# Patient Record
Sex: Male | Born: 1960
Health system: Southern US, Community
[De-identification: ages and names within clinical notes are randomized; demographics above are authoritative.]

## PROBLEM LIST (undated history)

## (undated) DIAGNOSIS — I639 Cerebral infarction, unspecified: Secondary | ICD-10-CM

## (undated) DIAGNOSIS — H9190 Unspecified hearing loss, unspecified ear: Secondary | ICD-10-CM

## (undated) DIAGNOSIS — D649 Anemia, unspecified: Secondary | ICD-10-CM

## (undated) DIAGNOSIS — C801 Malignant (primary) neoplasm, unspecified: Secondary | ICD-10-CM

## (undated) DIAGNOSIS — I2510 Atherosclerotic heart disease of native coronary artery without angina pectoris: Secondary | ICD-10-CM

## (undated) DIAGNOSIS — E785 Hyperlipidemia, unspecified: Secondary | ICD-10-CM

## (undated) DIAGNOSIS — G8929 Other chronic pain: Secondary | ICD-10-CM

## (undated) DIAGNOSIS — I219 Acute myocardial infarction, unspecified: Secondary | ICD-10-CM

## (undated) DIAGNOSIS — I10 Essential (primary) hypertension: Secondary | ICD-10-CM

## (undated) DIAGNOSIS — G43909 Migraine, unspecified, not intractable, without status migrainosus: Secondary | ICD-10-CM

## (undated) DIAGNOSIS — I739 Peripheral vascular disease, unspecified: Secondary | ICD-10-CM

## (undated) DIAGNOSIS — Z9289 Personal history of other medical treatment: Secondary | ICD-10-CM

## (undated) DIAGNOSIS — I422 Other hypertrophic cardiomyopathy: Secondary | ICD-10-CM

## (undated) DIAGNOSIS — M549 Dorsalgia, unspecified: Secondary | ICD-10-CM

## (undated) HISTORY — PX: INGUINAL HERNIA REPAIR: SUR1180

## (undated) HISTORY — DX: Cerebral infarction, unspecified: I63.9

## (undated) HISTORY — DX: Atherosclerotic heart disease of native coronary artery without angina pectoris: I25.10

## (undated) HISTORY — DX: Anemia, unspecified: D64.9

## (undated) HISTORY — DX: Hyperlipidemia, unspecified: E78.5

## (undated) HISTORY — DX: Acute myocardial infarction, unspecified: I21.9

## (undated) HISTORY — DX: Personal history of other medical treatment: Z92.89

## (undated) HISTORY — DX: Essential (primary) hypertension: I10

---

## 2006-02-20 ENCOUNTER — Inpatient Hospital Stay (HOSPITAL_COMMUNITY): Admission: EM | Admit: 2006-02-20 | Discharge: 2006-02-24 | Payer: Self-pay | Admitting: Emergency Medicine

## 2006-02-21 ENCOUNTER — Encounter (INDEPENDENT_AMBULATORY_CARE_PROVIDER_SITE_OTHER): Payer: Self-pay | Admitting: Interventional Cardiology

## 2006-02-21 ENCOUNTER — Encounter (INDEPENDENT_AMBULATORY_CARE_PROVIDER_SITE_OTHER): Payer: Self-pay | Admitting: Neurology

## 2006-02-28 ENCOUNTER — Encounter: Admission: RE | Admit: 2006-02-28 | Discharge: 2006-03-09 | Payer: Self-pay | Admitting: Neurology

## 2006-11-29 ENCOUNTER — Emergency Department (HOSPITAL_COMMUNITY): Admission: EM | Admit: 2006-11-29 | Discharge: 2006-11-30 | Payer: Self-pay | Admitting: Emergency Medicine

## 2009-09-17 ENCOUNTER — Inpatient Hospital Stay (HOSPITAL_COMMUNITY): Admission: EM | Admit: 2009-09-17 | Discharge: 2009-09-22 | Payer: Self-pay | Admitting: Emergency Medicine

## 2009-10-15 ENCOUNTER — Ambulatory Visit: Payer: Self-pay | Admitting: Internal Medicine

## 2009-10-15 DIAGNOSIS — Z8679 Personal history of other diseases of the circulatory system: Secondary | ICD-10-CM | POA: Insufficient documentation

## 2009-10-15 DIAGNOSIS — R51 Headache: Secondary | ICD-10-CM | POA: Insufficient documentation

## 2009-10-15 DIAGNOSIS — E785 Hyperlipidemia, unspecified: Secondary | ICD-10-CM | POA: Insufficient documentation

## 2009-10-15 DIAGNOSIS — R519 Headache, unspecified: Secondary | ICD-10-CM | POA: Insufficient documentation

## 2009-10-15 DIAGNOSIS — F17200 Nicotine dependence, unspecified, uncomplicated: Secondary | ICD-10-CM

## 2009-10-15 DIAGNOSIS — I252 Old myocardial infarction: Secondary | ICD-10-CM | POA: Insufficient documentation

## 2009-10-15 DIAGNOSIS — I10 Essential (primary) hypertension: Secondary | ICD-10-CM | POA: Insufficient documentation

## 2009-10-15 LAB — CONVERTED CEMR LAB
Cholesterol, target level: 200 mg/dL
HDL goal, serum: 40 mg/dL

## 2009-10-16 ENCOUNTER — Telehealth: Payer: Self-pay | Admitting: Internal Medicine

## 2009-11-05 ENCOUNTER — Telehealth: Payer: Self-pay | Admitting: Internal Medicine

## 2009-12-15 ENCOUNTER — Ambulatory Visit: Payer: Self-pay | Admitting: Internal Medicine

## 2009-12-15 DIAGNOSIS — K645 Perianal venous thrombosis: Secondary | ICD-10-CM

## 2009-12-15 LAB — CONVERTED CEMR LAB
ALT: 18 units/L (ref 0–53)
AST: 27 units/L (ref 0–37)
Albumin: 3.9 g/dL (ref 3.5–5.2)
Basophils Absolute: 0 10*3/uL (ref 0.0–0.1)
Basophils Relative: 0.7 % (ref 0.0–3.0)
Eosinophils Relative: 3.8 % (ref 0.0–5.0)
GFR calc non Af Amer: 77.07 mL/min (ref >60)
Glucose, Bld: 72 mg/dL (ref 70–99)
HCT: 30.8 % — ABNORMAL LOW (ref 39.0–52.0)
Hemoglobin: 10.4 g/dL — ABNORMAL LOW (ref 13.0–17.0)
Lymphs Abs: 1.7 10*3/uL (ref 0.7–4.0)
Monocytes Relative: 7.9 % (ref 3.0–12.0)
Neutro Abs: 2.2 10*3/uL (ref 1.4–7.7)
Potassium: 4 meq/L (ref 3.5–5.1)
RBC: 3.38 M/uL — ABNORMAL LOW (ref 4.22–5.81)
RDW: 13.6 % (ref 11.5–14.6)
Sodium: 142 meq/L (ref 135–145)
TSH: 1.83 microintl units/mL (ref 0.35–5.50)
Total Protein: 6.3 g/dL (ref 6.0–8.3)
VLDL: 9.6 mg/dL (ref 0.0–40.0)

## 2010-01-07 ENCOUNTER — Encounter: Payer: Self-pay | Admitting: Internal Medicine

## 2010-04-05 ENCOUNTER — Emergency Department (HOSPITAL_COMMUNITY): Admission: EM | Admit: 2010-04-05 | Discharge: 2010-04-05 | Payer: Self-pay | Admitting: Emergency Medicine

## 2010-07-02 NOTE — Assessment & Plan Note (Signed)
Summary: new [facility] pt--pkg--#---stc   Vital Signs:  Patient profile:   50 year old male Height:      68 inches Weight:      194.25 pounds BMI:     29.64 O2 Sat:      97 % on Room air Temp:     99.0 degrees F oral Pulse rate:   81 / minute Pulse rhythm:   regular Resp:     16 per minute BP sitting:   110 / 76  (left arm) Cuff size:   large  Vitals Entered By: Lucious Groves (Oct 15, 2009 10:31 AM)  Nutrition Counseling: Patient's BMI is greater than 25 and therefore counseled on weight management options.  O2 Flow:  Room air CC: NP--Est. Care./kb, Hypertension Management, Lipid Management Is Patient Diabetic? No Pain Assessment Patient in pain? no        Primary Care Provider:  Lennox Leikam  CC:  NP--Est. Care./kb, Hypertension Management, and Lipid Management.  History of Present Illness: New to me he states that he had a small MI one month ago at [facility]/Dr Katrinka Blazing says his coronary arteries were too small for stenting, he has fatigue but otherwise is doing well.  Hypertension History:      He denies headache, chest pain, palpitations, dyspnea with exertion, orthopnea, PND, peripheral edema, visual symptoms, neurologic problems, syncope, and side effects from treatment.        Positive major cardiovascular risk factors include male age 62 years old or older, hyperlipidemia, hypertension, and current tobacco user.  Negative major cardiovascular risk factors include no history of diabetes and negative family history for ischemic heart disease.        Positive history for target organ damage include ASHD (either angina/prior MI/prior CABG) and prior stroke (or TIA).  Further assessment for target organ damage reveals no history of cardiac end-organ damage (CHF/LVH), peripheral vascular disease, renal insufficiency, or hypertensive retinopathy.    Lipid Management History:      Positive NCEP/ATP III risk factors include male age 64 years old or older, current tobacco user, hypertension, ASHD  (either angina/prior MI/prior CABG), and prior stroke (or TIA).  Negative NCEP/ATP III risk factors include non-diabetic, no family history for ischemic heart disease, no peripheral vascular disease, and no history of aortic aneurysm.        The patient states that he knows about the "Therapeutic Lifestyle Change" diet.  His compliance with the TLC diet is good.  The patient expresses understanding of adjunctive measures for cholesterol lowering.  Adjunctive measures started by the patient include aerobic exercise, fiber, ASA, limit alcohol consumpton, and weight reduction.  He expresses no side effects from his lipid-lowering medication.  The patient denies any symptoms to suggest myopathy or liver disease.      Preventive Screening-Counseling & Management  Alcohol-Tobacco     Alcohol drinks/day: 0     Smoking Status: current     Smoking Cessation Counseling: yes     Smoke Cessation Stage: precontemplative     Packs/Day: 1.0     Year Started: 1985     Pack years: 59     Tobacco Counseling: to quit use of tobacco products  Hep-HIV-STD-Contraception     Hepatitis Risk: no risk noted     HIV Risk: no risk noted     STD Risk: no risk noted      Sexual History:  currently monogamous.        Drug Use:  never and no.  Blood Transfusions:  no.    Medications Prior to Update: 1)  None  Current Medications (verified): 1)  Amlodipine Besylate 10 Mg Tabs (Amlodipine Besylate) .Marland Kitchen.. 1 By Mouth Qd 2)  Aspirin 325 Mg Tabs (Aspirin) .Marland Kitchen.. 1 By Mouth Qd 3)  Flexeril 10 Mg Tabs (Cyclobenzaprine Hcl) .Marland Kitchen.. 1 By Mouth Three Times A Day Prn 4)  Hydrochlorothiazide 12.5 Mg Caps (Hydrochlorothiazide) .Marland Kitchen.. 1 By Mouth Qd 5)  Hydrocodone-Acetaminophen 5-325 Mg Tabs (Hydrocodone-Acetaminophen) .Marland Kitchen.. 1-2 By Mouth Q 6h Prn 6)  Labetalol Hcl 200 Mg Tabs (Labetalol Hcl) .Marland Kitchen.. 1 By Mouth Bid 7)  Benicar 40 Mg Tabs (Olmesartan Medoxomil) .Marland Kitchen.. 1 By Mouth Qd 8)  Pantoprazole Sodium 40 Mg Tbec (Pantoprazole  Sodium) .Marland Kitchen.. 1 By Mouth Qd 9)  Zocor 40 Mg Tabs (Simvastatin) .Marland Kitchen.. 1 By Mouth Qd  Allergies (verified): 1)  ! Lisinopril  Past History:  Past Medical History: Headache Hyperlipidemia Hypertension Myocardial infarction, hx of Cerebrovascular accident, hx of  Past Surgical History: Hernia 1988  Family History: Reviewed history and no changes required. Family History High cholesterol Family History Hypertension  Social History: Reviewed history and no changes required. Occupation:  Nutritional therapist Current Smoker Alcohol use-no Drug use-no Smoking Status:  current Drug Use:  never, no Packs/Day:  1.0 Hepatitis Risk:  no risk noted HIV Risk:  no risk noted STD Risk:  no risk noted Sexual History:  currently monogamous Blood Transfusions:  no  Review of Systems  The patient denies anorexia, fever, weight loss, weight gain, chest pain, peripheral edema, prolonged cough, abdominal pain, hematuria, difficulty walking, and depression.    Physical Exam  General:  alert, well-developed, well-nourished, well-hydrated, appropriate dress, normal appearance, healthy-appearing, cooperative to examination, and good hygiene.   Head:  normocephalic, atraumatic, no abnormalities observed, and no abnormalities palpated.   Eyes:  vision grossly intact, pupils equal, pupils round, and pupils reactive to light.   Mouth:  Oral mucosa and oropharynx without lesions or exudates.  Teeth in good repair. Neck:  supple, full ROM, no masses, no thyromegaly, no thyroid nodules or tenderness, no JVD, normal carotid upstroke, no carotid bruits, no cervical lymphadenopathy, and no neck tenderness.   Lungs:  normal respiratory effort, no intercostal retractions, no accessory muscle use, normal breath sounds, no dullness, no fremitus, no crackles, and no wheezes.   Heart:  normal rate, regular rhythm, no murmur, no gallop, no rub, and no JVD.   Abdomen:  soft, non-tender, normal bowel sounds, no distention, no  masses, no guarding, no rigidity, no rebound tenderness, no abdominal hernia, no inguinal hernia, no hepatomegaly, and no splenomegaly.   Msk:  normal ROM, no joint tenderness, no joint swelling, no joint warmth, no redness over joints, no joint deformities, no joint instability, no crepitation, and no muscle atrophy.   Pulses:  R and L carotid,radial,femoral,dorsalis pedis and posterior tibial pulses are full and equal bilaterally Extremities:  No clubbing, cyanosis, edema, or deformity noted with normal full range of motion of all joints.   Neurologic:  No cranial nerve deficits noted. Station and gait are normal. Plantar reflexes are down-going bilaterally. DTRs are symmetrical throughout. Sensory, motor and coordinative functions appear intact. Skin:  Intact without suspicious lesions or rashes Cervical Nodes:  No lymphadenopathy noted Axillary Nodes:  No palpable lymphadenopathy Inguinal Nodes:  No significant adenopathy Psych:  Cognition and judgment appear intact. Alert and cooperative with normal attention span and concentration. No apparent delusions, illusions, hallucinations   Impression & Recommendations:  Problem # 1:  TOBACCO  USE (ICD-305.1) Assessment New  Encouraged smoking cessation and discussed different methods for smoking cessation.   Orders: Tobacco use cessation intermediate 3-10 minutes ([address]109)  Problem # 2:  HYPERTENSION (ICD-401.9) Assessment: Improved  His updated medication list for this problem includes:    Amlodipine Besylate 10 Mg Tabs (Amlodipine besylate) .Marland Kitchen... 1 by mouth qd    Hydrochlorothiazide 12.5 Mg Caps (Hydrochlorothiazide) .Marland Kitchen... 1 by mouth qd    Labetalol Hcl 200 Mg Tabs (Labetalol hcl) .Marland Kitchen... 1 by mouth bid    Benicar 40 Mg Tabs (Olmesartan medoxomil) .Marland Kitchen... 1 by mouth qd  BP today: 110/76  10 Yr Risk Heart Disease: N/A  Orders: Tobacco use cessation intermediate 3-10 minutes (60454)  Problem # 3:  HYPERLIPIDEMIA (ICD-272.4) Assessment:  Unchanged  His updated medication list for this problem includes:    Zocor 40 Mg Tabs (Simvastatin) .Marland Kitchen... 1 by mouth qd  Lipid Goals: Chol Goal: 200 (10/15/2009)   HDL Goal: 40 (10/15/2009)   LDL Goal: 70 (10/15/2009)   TG Goal: 150 (10/15/2009)  10 Yr Risk Heart Disease: N/A  Problem # 4:  MYOCARDIAL INFARCTION, HX OF (ICD-412) Assessment: Unchanged  His updated medication list for this problem includes:    Amlodipine Besylate 10 Mg Tabs (Amlodipine besylate) .Marland Kitchen... 1 by mouth qd    Aspirin 325 Mg Tabs (Aspirin) .Marland Kitchen... 1 by mouth qd    Hydrochlorothiazide 12.5 Mg Caps (Hydrochlorothiazide) .Marland Kitchen... 1 by mouth qd    Labetalol Hcl 200 Mg Tabs (Labetalol hcl) .Marland Kitchen... 1 by mouth bid    Benicar 40 Mg Tabs (Olmesartan medoxomil) .Marland Kitchen... 1 by mouth qd  Orders: Tobacco use cessation intermediate 3-10 minutes (99406)  Complete Medication List: 1)  Amlodipine Besylate 10 Mg Tabs (Amlodipine besylate) .Marland Kitchen.. 1 by mouth qd 2)  Aspirin 325 Mg Tabs (Aspirin) .Marland Kitchen.. 1 by mouth qd 3)  Flexeril 10 Mg Tabs (Cyclobenzaprine hcl) .Marland Kitchen.. 1 by mouth three times a day prn 4)  Hydrochlorothiazide 12.5 Mg Caps (Hydrochlorothiazide) .Marland Kitchen.. 1 by mouth qd 5)  Hydrocodone-acetaminophen 5-325 Mg Tabs (Hydrocodone-acetaminophen) .Marland Kitchen.. 1-2 by mouth q 6h prn 6)  Labetalol Hcl 200 Mg Tabs (Labetalol hcl) .Marland Kitchen.. 1 by mouth bid 7)  Benicar 40 Mg Tabs (Olmesartan medoxomil) .Marland Kitchen.. 1 by mouth qd 8)  Pantoprazole Sodium 40 Mg Tbec (Pantoprazole sodium) .Marland Kitchen.. 1 by mouth qd 9)  Zocor 40 Mg Tabs (Simvastatin) .Marland Kitchen.. 1 by mouth qd  Hypertension Assessment/Plan:      The patient's hypertensive risk group is category C: Target organ damage and/or diabetes.  Today's blood pressure is 110/76.    Lipid Assessment/Plan:      Based on NCEP/ATP III, the patient's risk factor category is "history of coronary disease, peripheral vascular disease, cerebrovascular disease, or aortic aneurysm along with either diabetes, current smoker, or LDL > 130 plus HDL  < 40 plus triglycerides > 200".  The patient's lipid goals are as follows: Total cholesterol goal is 200; LDL cholesterol goal is 70; HDL cholesterol goal is 40; Triglyceride goal is 150.    Patient Instructions: 1)  Please schedule a follow-up appointment in 2 months for a complete physical and fasting labs. 2)  Tobacco is very bad for your health and your loved ones! You Should stop smoking!. 3)  Stop Smoking Tips: Choose a Quit date. Cut down before the Quit date. decide what you will do as a substitute when you feel the urge to smoke(gum,toothpick,exercise). 4)  Check your Blood Pressure regularly. If it is above 130/80: you should make an  appointment. Prescriptions: ZOCOR 40 MG TABS (SIMVASTATIN) 1 by mouth qd  #30 x 11   Entered and Authorized by:   Etta Grandchild MD   Signed by:   Etta Grandchild MD on 10/15/2009   Method used:   Electronically to        [facility] [phone]* (retail)       [address]       [place], [place]  [address]       Ph: [phone]       Fax: [phone]   RxID:   [id number] PANTOPRAZOLE SODIUM 40 MG TBEC (PANTOPRAZOLE SODIUM) 1 by mouth qd  #30 x 11   Entered and Authorized by:   Etta Grandchild MD   Signed by:   Etta Grandchild MD on 10/15/2009   Method used:   Electronically to        [facility] [phone]* (retail)       [address]       [place], [place]  [address]       Ph: [phone]       Fax: [phone]   RxID:   [id number] BENICAR 40 MG TABS (OLMESARTAN MEDOXOMIL) 1 by mouth qd  #30 x 11   Entered and Authorized by:   Etta Grandchild MD   Signed by:   Etta Grandchild MD on 10/15/2009   Method used:   Electronically to        [facility] [phone]* (retail)       [address]       [place], [place]  [address]       Ph: [phone]       Fax: [phone]   RxID:   [id number] LABETALOL HCL 200 MG TABS (LABETALOL HCL) 1 by mouth bid  #60  x 11   Entered and Authorized by:   Etta Grandchild MD   Signed by:   Etta Grandchild MD on 10/15/2009   Method used:   Electronically to        [facility] [phone]* (retail)       [address]       [place], [place]  [address]       Ph: [phone]       Fax: [phone]   RxID:   [id number] HYDROCHLOROTHIAZIDE 12.5 MG CAPS (HYDROCHLOROTHIAZIDE) 1 by mouth qd  #30 x 11   Entered and Authorized by:   Etta Grandchild MD   Signed by:   Etta Grandchild MD on 10/15/2009   Method used:   Electronically to        [facility] [address] [phone]* (retail)       [address]       [place], [place]  [address]       Ph: [phone]       Fax: [phone]   RxID:   [id number] ASPIRIN 325 MG TABS (ASPIRIN) 1 by mouth qd  #30 x 11   Entered and Authorized by:   Etta Grandchild MD   Signed by:   Etta Grandchild MD on 10/15/2009   Method used:   Electronically to  [facility] #[phone]* (retail)       [address]       [place], [place]  [address]       Ph: [phone]       Fax: [phone]   RxID:   [id number] AMLODIPINE BESYLATE 10 MG TABS (AMLODIPINE BESYLATE) 1 by mouth qd  #30 x 11   Entered and Authorized by:   Etta Grandchild MD   Signed by:   Etta Grandchild MD on 10/15/2009   Method used:   Electronically to        [facility] [phone]* (retail)       [address]       [place], [place]  [address]       Ph: [phone]       Fax: [phone]   RxID:   [id number] ZOCOR 40 MG TABS (SIMVASTATIN) 1 by mouth qd  #30 x 11   Entered and Authorized by:   Etta Grandchild MD   Signed by:   Etta Grandchild MD on 10/15/2009   Method used:   Electronically to        [facility] [phone]* (retail)       [address]       [place], [place]  [address]       Ph: [phone] or [phone]       Fax: [phone]   RxID:    [id number] PANTOPRAZOLE SODIUM 40 MG TBEC (PANTOPRAZOLE SODIUM) 1 by mouth qd  #30 x 11   Entered and Authorized by:   Etta Grandchild MD   Signed by:   Etta Grandchild MD on 10/15/2009   Method used:   Electronically to        [facility] [phone]* (retail)       [address]       [place], [place]  [address]       Ph: [phone] or [phone]       Fax: [phone]   RxID:   [id number] BENICAR 40 MG TABS (OLMESARTAN MEDOXOMIL) 1 by mouth qd  #30 x 11   Entered and Authorized by:   Etta Grandchild MD   Signed by:   Etta Grandchild MD on 10/15/2009   Method used:   Electronically to        [facility] [phone]* (retail)       [address]       [place], [place]  [address]       Ph: [phone] or [phone]       Fax: [phone]   RxID:   [id number] LABETALOL HCL 200 MG TABS (LABETALOL HCL) 1 by mouth bid  #60 x 11   Entered and Authorized by:   Etta Grandchild MD   Signed by:   Etta Grandchild MD on 10/15/2009   Method used:   Electronically to        [facility] [phone]* (retail)       [address]       [place], [place]  [address]  Ph: [phone] or [phone]       Fax: [phone]   RxID:   [id number] HYDROCHLOROTHIAZIDE 12.5 MG CAPS (HYDROCHLOROTHIAZIDE) 1 by mouth qd  #30 x 11   Entered and Authorized by:   Etta Grandchild MD   Signed by:   Etta Grandchild MD on 10/15/2009   Method used:   Electronically to        [facility] [phone]* (retail)       [address]       [place], [place]  [address]       Ph: [phone] or [phone]       Fax: [phone]   RxID:   [id number] ASPIRIN 325 MG TABS (ASPIRIN) 1 by mouth qd  #30 x 11   Entered and Authorized by:   Etta Grandchild MD   Signed by:   Etta Grandchild MD on 10/15/2009   Method used:   Electronically to        [facility] [phone]* (retail)       [address]       [place], [place]  [address]       Ph: [phone] or [phone]       Fax: [phone]   RxID:   [id number] AMLODIPINE BESYLATE 10 MG TABS (AMLODIPINE BESYLATE) 1 by mouth qd  #30 x 11   Entered and Authorized by:   Etta Grandchild MD   Signed by:   Etta Grandchild MD on 10/15/2009   Method used:   Electronically to        [facility] [phone]* (retail)       [address]       [place], [place]  [address]       Ph: [phone] or [phone]       Fax: [phone]   RxID:   [id number]

## 2010-07-02 NOTE — Progress Notes (Signed)
Summary: PA-Pantoprazole  Phone Note From Pharmacy   Summary of Call: PA-request pantoprazole. Has this pt tried omeprazole and failed nexium. Initial call taken by: Dagoberto Reef,  Oct 16, 2009 3:30 PM  Follow-up for Phone Call        I don't know, will send an Rx for Nexium and see what happens Follow-up by: Etta Grandchild MD,  Oct 20, 2009 7:50 AM  Additional Follow-up for Phone Call Additional follow up Details #1::        which pharmacy does he use? Additional Follow-up by: Etta Grandchild MD,  Oct 20, 2009 7:51 AM    Additional Follow-up for Phone Call Additional follow up Details #2::    Home # is disconnected/not in service. Tried 3 [phone] that was on fax form from pharmacy and left message on machine to call back to office. Follow-up by: Lucious Groves,  Oct 28, 2009 10:21 AM  Additional Follow-up for Phone Call Additional follow up Details #3:: Details for Additional Follow-up Action Taken: No response, tried to reach patient again, and all #'s are not in service. Closed phone note until patient calls. Additional Follow-up by: Lucious Groves,  October 30, 2009 1:45 PM  New/Updated Medications: NEXIUM 40 MG CPDR (ESOMEPRAZOLE MAGNESIUM) One by mouth once daily for heartburn Prescriptions: NEXIUM 40 MG CPDR (ESOMEPRAZOLE MAGNESIUM) One by mouth once daily for heartburn  #30 x 11   Entered and Authorized by:   Etta Grandchild MD   Signed by:   Etta Grandchild MD on 10/20/2009   Method used:   Historical   RxID:   [id number]   Appended Document: PA-Pantoprazole spoke with pt (cell [phone]) and advised on PA request, he must try and fail nexium first. Pt will pick up samples and send rx [facility]

## 2010-07-02 NOTE — Assessment & Plan Note (Signed)
Summary: CPX / # / WILL COME FASTING / CD   Vital Signs:  Patient profile:   50 year old male Height:      68 inches Weight:      199.25 pounds BMI:     30.41 O2 Sat:      98 % on Room air Temp:     98.2 degrees F oral Pulse rate:   52 / minute Pulse rhythm:   regular Resp:     16 per minute BP sitting:   132 / 78  (left arm) Cuff size:   large  Vitals Entered By: Rock Nephew CMA (December 15, 2009 8:05 AM)  Nutrition Counseling: Patient's BMI is greater than 25 and therefore counseled on weight management options.  O2 Flow:  Room air CC: CPX w/ labs, Hypertension Management, Preventive Care Is Patient Diabetic? No Pain Assessment Patient in pain? no        Primary Care Provider:  Arnol Mcgibbon  CC:  CPX w/ labs, Hypertension Management, and Preventive Care.  History of Present Illness: He returns for a complete physical with no complaints.  Hypertension History:      He complains of side effects from treatment, but denies headache, chest pain, palpitations, dyspnea with exertion, orthopnea, PND, peripheral edema, visual symptoms, neurologic problems, and syncope.  He notes the following problems with antihypertensive medication side effects: ED.        Positive major cardiovascular risk factors include male age 8 years old or older, hyperlipidemia, hypertension, and current tobacco user.  Negative major cardiovascular risk factors include no history of diabetes and negative family history for ischemic heart disease.        Positive history for target organ damage include ASHD (either angina/prior MI/prior CABG) and prior stroke (or TIA).  Further assessment for target organ damage reveals no history of cardiac end-organ damage (CHF/LVH), peripheral vascular disease, renal insufficiency, or hypertensive retinopathy.     Preventive Screening-Counseling & Management  Alcohol-Tobacco     Alcohol drinks/day: 0     Smoking Status: current     Smoking Cessation Counseling: yes  Smoke Cessation Stage: precontemplative     Packs/Day: 1.0     Year Started: 1985     Pack years: 25     Tobacco Counseling: to quit use of tobacco products  Hep-HIV-STD-Contraception     Hepatitis Risk: no risk noted     HIV Risk: no risk noted     STD Risk: no risk noted  Safety-Violence-Falls     Seat Belt Use: yes     Helmet Use: yes     Firearms in the Home: no firearms in the home     Smoke Detectors: yes     Violence in the Home: no risk noted      Sexual History:  currently monogamous.        Drug Use:  never and no.        Blood Transfusions:  no.    Current Medications (verified): 1)  Amlodipine Besylate 10 Mg Tabs (Amlodipine Besylate) .Marland Kitchen.. 1 By Mouth Qd 2)  Aspirin 325 Mg Tabs (Aspirin) .Marland Kitchen.. 1 By Mouth Qd 3)  Flexeril 10 Mg Tabs (Cyclobenzaprine Hcl) .Marland Kitchen.. 1 By Mouth Three Times A Day Prn 4)  Hydrochlorothiazide 12.5 Mg Caps (Hydrochlorothiazide) .Marland Kitchen.. 1 By Mouth Qd 5)  Hydrocodone-Acetaminophen 5-325 Mg Tabs (Hydrocodone-Acetaminophen) .Marland Kitchen.. 1-2 By Mouth Q 6h Prn 6)  Labetalol Hcl 200 Mg Tabs (Labetalol Hcl) .Marland Kitchen.. 1 By Mouth Bid 7)  Benicar  40 Mg Tabs (Olmesartan Medoxomil) .Marland Kitchen.. 1 By Mouth Qd 8)  Zocor 40 Mg Tabs (Simvastatin) .Marland Kitchen.. 1 By Mouth Qd 9)  Nexium 40 Mg Cpdr (Esomeprazole Magnesium) .... One By Mouth Once Daily For Heartburn  Allergies (verified): 1)  ! Lisinopril  Past History:  Past Medical History: Last updated: 10/15/2009 Headache Hyperlipidemia Hypertension Myocardial infarction, hx of Cerebrovascular accident, hx of  Past Surgical History: Last updated: 10/15/2009 Hernia 1988  Family History: Last updated: 12/15/2009 Family History High cholesterol Family History Hypertension Family History of Prostate CA 1st degree relative <50  Social History: Last updated: 10/15/2009 Occupation:  Plumber Current Smoker Alcohol use-no Drug use-no  Risk Factors: Alcohol Use: 0 (12/15/2009)  Risk Factors: Smoking Status: current  (12/15/2009) Packs/Day: 1.0 (12/15/2009)  Family History: Reviewed history from 10/15/2009 and no changes required. Family History High cholesterol Family History Hypertension Family History of Prostate CA 1st degree relative <50  Social History: Reviewed history from 10/15/2009 and no changes required. Occupation:  Nutritional therapist Current Smoker Alcohol use-no Drug use-no Risk analyst Use:  yes  Review of Systems  The patient denies anorexia, fever, weight loss, weight gain, chest pain, dyspnea on exertion, peripheral edema, headaches, abdominal pain, melena, hematochezia, severe indigestion/heartburn, hematuria, difficulty walking, depression, enlarged lymph nodes, angioedema, and testicular masses.    Physical Exam  General:  alert, well-developed, well-nourished, well-hydrated, appropriate dress, normal appearance, healthy-appearing, cooperative to examination, and good hygiene.   Head:  normocephalic, atraumatic, no abnormalities observed, and no abnormalities palpated.   Eyes:  vision grossly intact, pupils equal, pupils round, and pupils reactive to light.   Mouth:  Oral mucosa and oropharynx without lesions or exudates.  Teeth in good repair. Neck:  supple, full ROM, no masses, no thyromegaly, no thyroid nodules or tenderness, no JVD, normal carotid upstroke, no carotid bruits, no cervical lymphadenopathy, and no neck tenderness.   Breasts:  No masses or gynecomastia noted Lungs:  normal respiratory effort, no intercostal retractions, no accessory muscle use, normal breath sounds, no dullness, no fremitus, no crackles, and no wheezes.   Heart:  normal rate, regular rhythm, no murmur, no gallop, no rub, and no JVD.   Abdomen:  soft, non-tender, normal bowel sounds, no distention, no masses, no guarding, no rigidity, no rebound tenderness, no abdominal hernia, no inguinal hernia, no hepatomegaly, and no splenomegaly.   Rectal:  normal sphincter tone, no masses, no tenderness, no fissures,  no fistulae, no perianal rash, and external hemorrhoid(s).   Genitalia:  uncircumcised, no hydrocele, no varicocele, no scrotal masses, no testicular masses or atrophy, no cutaneous lesions, and no urethral discharge.   Prostate:  Prostate gland firm and smooth, no enlargement, nodularity, tenderness, mass, asymmetry or induration. Msk:  normal ROM, no joint tenderness, no joint swelling, no joint warmth, no redness over joints, no joint deformities, no joint instability, no crepitation, and no muscle atrophy.   Pulses:  R and L carotid,radial,femoral,dorsalis pedis and posterior tibial pulses are full and equal bilaterally Extremities:  No clubbing, cyanosis, edema, or deformity noted with normal full range of motion of all joints.   Neurologic:  No cranial nerve deficits noted. Station and gait are normal. Plantar reflexes are down-going bilaterally. DTRs are symmetrical throughout. Sensory, motor and coordinative functions appear intact. Skin:  turgor normal, color normal, no rashes, no suspicious lesions, no ecchymoses, no petechiae, no purpura, no ulcerations, and no edema.   Cervical Nodes:  no anterior cervical adenopathy and no posterior cervical adenopathy.   Axillary Nodes:  no  R axillary adenopathy and no L axillary adenopathy.   Inguinal Nodes:  no R inguinal adenopathy and no L inguinal adenopathy.   Psych:  Cognition and judgment appear intact. Alert and cooperative with normal attention span and concentration. No apparent delusions, illusions, hallucinations   Impression & Recommendations:  Problem # 1:  EXTERNAL THROMBOSED HEMORRHOIDS (ICD-455.4) Assessment New  Orders: Surgical Referral (Surgery)  Problem # 2:  ROUTINE GENERAL MEDICAL [email or web address]  CARE FACL (ICD-V70.0)  Orders: Venipuncture ([address]) TLB-Lipid Panel (80061-LIPID) TLB-BMP (Basic Metabolic Panel-BMET) (80048-METABOL) TLB-CBC Platelet - w/Differential (85025-CBCD) TLB-Hepatic/Liver Function Pnl  (80076-HEPATIC) TLB-TSH (Thyroid Stimulating Hormone) (84443-TSH) TLB-PSA (Prostate Specific Antigen) (84153-PSA) Hemoccult Guaiac-1 spec.(in office) (82270)  Problem # 3:  TOBACCO USE (ICD-305.1) Assessment: Unchanged  Encouraged smoking cessation and discussed different methods for smoking cessation.   Complete Medication List: 1)  Amlodipine Besylate 10 Mg Tabs (Amlodipine besylate) .Marland Kitchen.. 1 by mouth qd 2)  Aspirin 325 Mg Tabs (Aspirin) .Marland Kitchen.. 1 by mouth qd 3)  Flexeril 10 Mg Tabs (Cyclobenzaprine hcl) .Marland Kitchen.. 1 by mouth three times a day prn 4)  Hydrochlorothiazide 12.5 Mg Caps (Hydrochlorothiazide) .Marland Kitchen.. 1 by mouth qd 5)  Hydrocodone-acetaminophen 5-325 Mg Tabs (Hydrocodone-acetaminophen) .Marland Kitchen.. 1-2 by mouth q 6h prn 6)  Labetalol Hcl 200 Mg Tabs (Labetalol hcl) .Marland Kitchen.. 1 by mouth bid 7)  Benicar 40 Mg Tabs (Olmesartan medoxomil) .Marland Kitchen.. 1 by mouth qd 8)  Zocor 40 Mg Tabs (Simvastatin) .Marland Kitchen.. 1 by mouth qd 9)  Nexium 40 Mg Cpdr (Esomeprazole magnesium) .... One by mouth once daily for heartburn  Other Orders: EKG w/ Interpretation (93000)  Hypertension Assessment/Plan:      The patient's hypertensive risk group is category C: Target organ damage and/or diabetes.  Today's blood pressure is 132/78.    Colorectal Screening:  Current Recommendations:    Hemoccult: NEG X 1 today  PSA Screening:    Reviewed PSA screening recommendations: PSA ordered  Patient Instructions: 1)  Please schedule a follow-up appointment in 3 months. 2)  Tobacco is very bad for your health and your loved ones! You Should stop smoking!. 3)  Stop Smoking Tips: Choose a Quit date. Cut down before the Quit date. decide what you will do as a substitute when you feel the urge to smoke(gum,toothpick,exercise). 4)  It is important that you exercise regularly at least 20 minutes 5 times a week. If you develop chest pain, have severe difficulty breathing, or feel very tired , stop exercising immediately and seek medical  attention. 5)  You need to lose weight. Consider a lower calorie diet and regular exercise.  6)  Check your Blood Pressure regularly. If it is above 130/80: you should make an appointment.   Not Administered:    Tetanus Vaccine not given due to: declined

## 2010-07-02 NOTE — Letter (Signed)
Summary: Results Follow-up Letter  [facility]  [address] [place], [place] [address]   Phone: [phone]  Fax: [phone]    12/15/2009  [address], [place]  [address]  Dear [name],   The following are the results of your recent test(s):  Test     Result     CBC       anemia Liver/kidney   normal Prostate     normal Thyroid     normal   _________________________________________________________  Please call for an appointment in 1-2 months _________________________________________________________ _________________________________________________________ _________________________________________________________  Sincerely,  Sanda Linger MD [facility]

## 2010-07-02 NOTE — Letter (Signed)
Summary: [facility]   Imported By: Lester [place] 01/19/2010 10:26:27  _____________________________________________________________________  External Attachment:    Type:   Image     Comment:   External Document

## 2010-07-02 NOTE — Progress Notes (Signed)
Summary: Nexium pa  Phone Note Outgoing Call   Summary of Call: Patient must try and fail both nexium and omeprazole before prior authorization for alternate ppi can be done. Nexium was sent in for patient and i must confirm that he has failed omeprazole. Left message on voicemail to call back to office. Initial call taken by: Lucious Groves,  November 05, 2009 11:03 AM  Follow-up for Phone Call        left message on voicemail to call back to office and notify if he has tried and failed Omeprazole. Follow-up by: Lucious Groves,  November 10, 2009 4:59 PM  Additional Follow-up for Phone Call Additional follow up Details #1::        No rtn call from patient, called again and home # is disconnected. Also tried [phone] and that number is also disconnected. Closed phone note until patient contacts the office. Additional Follow-up by: Lucious Groves,  November 12, 2009 10:53 AM

## 2010-07-02 NOTE — Letter (Signed)
Summary: Lipid Letter  [facility]  [address] [place], [place] [address]   Phone: [phone]  Fax: [phone]    12/15/2009  [name] [address] [place], [place]  [address]  Dear [name]:  We have carefully reviewed your last lipid profile from 12/15/2009 and the results are noted below with a summary of recommendations for lipid management.    Cholesterol:       166     Goal: <200   HDL "good" Cholesterol:   62.95     Goal: >40   LDL "bad" Cholesterol:   116     Goal: <70   Triglycerides:       48.0     Goal: <150        TLC Diet (Therapeutic Lifestyle Change): Saturated Fats & Transfatty acids should be kept < 7% of total calories ***Reduce Saturated Fats Polyunstaurated Fat can be up to 10% of total calories Monounsaturated Fat Fat can be up to 20% of total calories Total Fat should be no greater than 25-35% of total calories Carbohydrates should be 50-60% of total calories Protein should be approximately 15% of total calories Fiber should be at least 20-30 grams a day ***Increased fiber may help lower LDL Total Cholesterol should be < 200mg /day Consider adding plant stanol/sterols to diet (example: Benacol spread) ***A higher intake of unsaturated fat may reduce Triglycerides and Increase HDL    Adjunctive Measures (may lower LIPIDS and reduce risk of Heart Attack) include: Aerobic Exercise (20-30 minutes 3-4 times a week) Limit Alcohol Consumption Weight Reduction Aspirin 75-81 mg a day by mouth (if not allergic or contraindicated) Dietary Fiber 20-30 grams a day by mouth     Current Medications: 1)    Amlodipine Besylate 10 Mg Tabs (Amlodipine besylate) .Marland Kitchen.. 1 by mouth qd 2)    Aspirin 325 Mg Tabs (Aspirin) .Marland Kitchen.. 1 by mouth qd 3)    Flexeril 10 Mg Tabs (Cyclobenzaprine hcl) .Marland Kitchen.. 1 by mouth three times a day prn 4)    Hydrochlorothiazide 12.5 Mg Caps (Hydrochlorothiazide) .Marland Kitchen.. 1 by mouth qd 5)    Hydrocodone-acetaminophen  5-325 Mg Tabs (Hydrocodone-acetaminophen) .Marland Kitchen.. 1-2 by mouth q 6h prn 6)    Labetalol Hcl 200 Mg Tabs (Labetalol hcl) .Marland Kitchen.. 1 by mouth bid 7)    Benicar 40 Mg Tabs (Olmesartan medoxomil) .Marland Kitchen.. 1 by mouth qd 8)    Zocor 40 Mg Tabs (Simvastatin) .Marland Kitchen.. 1 by mouth qd 9)    Nexium 40 Mg Cpdr (Esomeprazole magnesium) .... One by mouth once daily for heartburn  If you have any questions, please call. We appreciate being able to work with you.   Sincerely,    [facility] [name] Grandchild MD

## 2010-08-11 LAB — CBC
MCH: 29.4 pg (ref 26.0–34.0)
MCHC: 33.5 g/dL (ref 30.0–36.0)
RDW: 12 % (ref 11.5–15.5)

## 2010-08-11 LAB — BASIC METABOLIC PANEL
BUN: 13 mg/dL (ref 6–23)
CO2: 24 mEq/L (ref 19–32)
Calcium: 8.4 mg/dL (ref 8.4–10.5)
Creatinine, Ser: 1.17 mg/dL (ref 0.4–1.5)
GFR calc non Af Amer: >60 mL/min (ref >60)
Glucose, Bld: 87 mg/dL (ref 70–99)

## 2010-08-11 LAB — DIFFERENTIAL
Basophils Absolute: 0 10*3/uL (ref 0.0–0.1)
Basophils Relative: 0 % (ref 0–1)
Eosinophils Absolute: 0.2 10*3/uL (ref 0.0–0.7)
Neutro Abs: 2.1 10*3/uL (ref 1.7–7.7)
Neutrophils Relative %: 43 % (ref 43–77)

## 2010-08-11 LAB — APTT: aPTT: 30 seconds (ref 24–37)

## 2010-08-11 LAB — PROTIME-INR: INR: 1.01 (ref 0.00–1.49)

## 2010-08-11 LAB — POCT CARDIAC MARKERS: Troponin i, poc: <0.05 ng/mL (ref 0.00–0.09)

## 2010-08-12 ENCOUNTER — Encounter: Payer: Self-pay | Admitting: Internal Medicine

## 2010-08-12 ENCOUNTER — Ambulatory Visit (INDEPENDENT_AMBULATORY_CARE_PROVIDER_SITE_OTHER): Payer: 59 | Admitting: Internal Medicine

## 2010-08-12 DIAGNOSIS — I252 Old myocardial infarction: Secondary | ICD-10-CM

## 2010-08-12 DIAGNOSIS — Z8679 Personal history of other diseases of the circulatory system: Secondary | ICD-10-CM

## 2010-08-12 DIAGNOSIS — F528 Other sexual dysfunction not due to a substance or known physiological condition: Secondary | ICD-10-CM | POA: Insufficient documentation

## 2010-08-12 DIAGNOSIS — I10 Essential (primary) hypertension: Secondary | ICD-10-CM

## 2010-08-18 LAB — BASIC METABOLIC PANEL
CO2: 27 mEq/L (ref 19–32)
CO2: 27 mEq/L (ref 19–32)
CO2: 28 mEq/L (ref 19–32)
Calcium: 9.4 mg/dL (ref 8.4–10.5)
Calcium: 9.4 mg/dL (ref 8.4–10.5)
Chloride: 101 mEq/L (ref 96–112)
Creatinine, Ser: 1.15 mg/dL (ref 0.4–1.5)
GFR calc Af Amer: >60 mL/min (ref >60)
GFR calc Af Amer: >60 mL/min (ref >60)
Glucose, Bld: 102 mg/dL — ABNORMAL HIGH (ref 70–99)
Sodium: 135 mEq/L (ref 135–145)
Sodium: 136 mEq/L (ref 135–145)

## 2010-08-18 LAB — CBC
HCT: 39.3 % (ref 39.0–52.0)
Hemoglobin: 12.1 g/dL — ABNORMAL LOW (ref 13.0–17.0)
Hemoglobin: 12.5 g/dL — ABNORMAL LOW (ref 13.0–17.0)
Hemoglobin: 13.3 g/dL (ref 13.0–17.0)
Hemoglobin: 13.5 g/dL (ref 13.0–17.0)
MCHC: 33.8 g/dL (ref 30.0–36.0)
MCHC: 33.9 g/dL (ref 30.0–36.0)
MCHC: 33.9 g/dL (ref 30.0–36.0)
MCHC: 34.1 g/dL (ref 30.0–36.0)
MCHC: 34.3 g/dL (ref 30.0–36.0)
MCV: 90 fL (ref 78.0–100.0)
MCV: 90.7 fL (ref 78.0–100.0)
Platelets: 262 10*3/uL (ref 150–400)
RBC: 3.9 MIL/uL — ABNORMAL LOW (ref 4.22–5.81)
RBC: 4.1 MIL/uL — ABNORMAL LOW (ref 4.22–5.81)
RBC: 4.26 MIL/uL (ref 4.22–5.81)
RBC: 4.33 MIL/uL (ref 4.22–5.81)
RBC: 4.38 MIL/uL (ref 4.22–5.81)
RDW: 11.8 % (ref 11.5–15.5)
WBC: 5.4 10*3/uL (ref 4.0–10.5)
WBC: 6.4 10*3/uL (ref 4.0–10.5)
WBC: 7.4 10*3/uL (ref 4.0–10.5)

## 2010-08-18 LAB — MAGNESIUM: Magnesium: 2 mg/dL (ref 1.5–2.5)

## 2010-08-18 LAB — URINALYSIS, ROUTINE W REFLEX MICROSCOPIC
Bilirubin Urine: NEGATIVE
Glucose, UA: NEGATIVE mg/dL
Ketones, ur: NEGATIVE mg/dL
Protein, ur: NEGATIVE mg/dL
pH: 7 (ref 5.0–8.0)

## 2010-08-18 LAB — COMPREHENSIVE METABOLIC PANEL
ALT: 13 U/L (ref 0–53)
AST: 23 U/L (ref 0–37)
Alkaline Phosphatase: 76 U/L (ref 39–117)
CO2: 27 mEq/L (ref 19–32)
Calcium: 9 mg/dL (ref 8.4–10.5)
Chloride: 108 mEq/L (ref 96–112)
GFR calc Af Amer: >60 mL/min (ref >60)
GFR calc non Af Amer: >60 mL/min (ref >60)
Glucose, Bld: 69 mg/dL — ABNORMAL LOW (ref 70–99)
Potassium: 3.6 mEq/L (ref 3.5–5.1)
Sodium: 139 mEq/L (ref 135–145)
Total Bilirubin: 0.7 mg/dL (ref 0.3–1.2)

## 2010-08-18 LAB — CK TOTAL AND CKMB (NOT AT ARMC)
CK, MB: 1.7 ng/mL (ref 0.3–4.0)
Total CK: 262 U/L — ABNORMAL HIGH (ref 7–232)

## 2010-08-18 LAB — URINE CULTURE: Colony Count: 7000

## 2010-08-18 LAB — LIPID PANEL
Cholesterol: 183 mg/dL (ref 0–200)
LDL Cholesterol: 123 mg/dL — ABNORMAL HIGH (ref 0–99)
Triglycerides: 154 mg/dL — ABNORMAL HIGH (ref <150)
VLDL: 31 mg/dL (ref 0–40)

## 2010-08-18 LAB — BRAIN NATRIURETIC PEPTIDE: Pro B Natriuretic peptide (BNP): <30 pg/mL (ref 0.0–100.0)

## 2010-08-18 LAB — POCT CARDIAC MARKERS
Myoglobin, poc: 84.9 ng/mL (ref 12–200)
Troponin i, poc: <0.05 ng/mL (ref 0.00–0.09)

## 2010-08-18 LAB — PROTIME-INR: INR: 1.04 (ref 0.00–1.49)

## 2010-08-18 LAB — DIFFERENTIAL
Basophils Relative: 1 % (ref 0–1)
Eosinophils Absolute: 0.1 10*3/uL (ref 0.0–0.7)
Eosinophils Relative: 2 % (ref 0–5)
Lymphs Abs: 2.7 10*3/uL (ref 0.7–4.0)
Neutrophils Relative %: 49 % (ref 43–77)

## 2010-08-18 LAB — CARDIAC PANEL(CRET KIN+CKTOT+MB+TROPI)
CK, MB: 1.3 ng/mL (ref 0.3–4.0)
CK, MB: 1.8 ng/mL (ref 0.3–4.0)
Total CK: 193 U/L (ref 7–232)

## 2010-08-18 LAB — RAPID URINE DRUG SCREEN, HOSP PERFORMED
Benzodiazepines: NOT DETECTED
Cocaine: NOT DETECTED
Opiates: NOT DETECTED

## 2010-08-18 LAB — TROPONIN I: Troponin I: 0.22 ng/mL — ABNORMAL HIGH (ref 0.00–0.06)

## 2010-08-18 LAB — APTT: aPTT: 30 seconds (ref 24–37)

## 2010-08-18 NOTE — Assessment & Plan Note (Signed)
Summary: FOLLOW UP /NWS  #   Vital Signs:  Patient profile:   50 year old male Height:      68 inches Weight:      206.25 pounds BMI:     31.47 O2 Sat:      97 % on Room air Temp:     98.6 degrees F oral Pulse rate:   58 / minute Pulse rhythm:   regular Resp:     16 per minute BP sitting:   108 / 70  (left arm) Cuff size:   large  Vitals Entered By: Rock Nephew CMA (August 12, 2010 8:42 AM)  Nutrition Counseling: Patient's BMI is greater than 25 and therefore counseled on weight management options.  O2 Flow:  Room air CC: follow-up visit, Lipid Management Is Patient Diabetic? No Pain Assessment Patient in pain? no        Primary Care Provider:  Tameisha Covell  CC:  follow-up visit and Lipid Management.  History of Present Illness:  Hypertension Follow-Up      This is a 50 year old man who presents for Hypertension follow-up.  The patient reports impotence, but denies lightheadedness, urinary frequency, headaches, edema, rash, and fatigue.  The patient denies the following associated symptoms: chest pain, chest pressure, exercise intolerance, dyspnea, palpitations, syncope, leg edema, and pedal edema.  Compliance with medications (by patient report) has been near 100%.  The patient reports that dietary compliance has been good.  The patient reports exercising occasionally.  Adjunctive measures currently used by the patient include salt restriction and relaxation.    Lipid Management History:      Positive NCEP/ATP III risk factors include male age 52 years old or older, current tobacco user, hypertension, ASHD (either angina/prior MI/prior CABG), and prior stroke (or TIA).  Negative NCEP/ATP III risk factors include non-diabetic, no family history for ischemic heart disease, no peripheral vascular disease, and no history of aortic aneurysm.        The patient states that he knows about the "Therapeutic Lifestyle Change" diet.  His compliance with the TLC diet is good.  The patient  expresses understanding of adjunctive measures for cholesterol lowering.  Adjunctive measures started by the patient include aerobic exercise, fiber, ASA, omega-3 supplements, limit alcohol consumpton, and weight reduction.  He expresses no side effects from his lipid-lowering medication.  The patient denies any symptoms to suggest myopathy or liver disease.     Preventive Screening-Counseling & Management  Alcohol-Tobacco     Alcohol drinks/day: 0     Alcohol Counseling: not indicated; patient does not drink     Smoking Status: current     Smoking Cessation Counseling: yes     Smoke Cessation Stage: precontemplative     Packs/Day: 1.0     Year Started: 1985     Pack years: 31     Tobacco Counseling: to quit use of tobacco products  Hep-HIV-STD-Contraception     Hepatitis Risk: no risk noted     HIV Risk: no risk noted     STD Risk: no risk noted      Sexual History:  currently monogamous.        Drug Use:  never and no.        Blood Transfusions:  no.    Clinical Review Panels:  Prevention   Last PSA:  1.34 (12/15/2009)  Lipid Management   Cholesterol:  166 (12/15/2009)   LDL (bad choesterol):  116 (12/15/2009)   HDL (good cholesterol):  40.30 (12/15/2009)  Diabetes Management   Creatinine:  1.3 (12/15/2009)  CBC   WBC:  4.4 (12/15/2009)   RBC:  3.38 (12/15/2009)   Hgb:  10.4 (12/15/2009)   Hct:  30.8 (12/15/2009)   Platelets:  219.0 (12/15/2009)   MCV  91.1 (12/15/2009)   MCHC  33.7 (12/15/2009)   RDW  13.6 (12/15/2009)   PMN:  49.2 (12/15/2009)   Lymphs:  38.4 (12/15/2009)   Monos:  7.9 (12/15/2009)   Eosinophils:  3.8 (12/15/2009)   Basophil:  0.7 (12/15/2009)  Complete Metabolic Panel   Glucose:  72 (12/15/2009)   Sodium:  142 (12/15/2009)   Potassium:  4.0 (12/15/2009)   Chloride:  111 (12/15/2009)   CO2:  29 (12/15/2009)   BUN:  12 (12/15/2009)   Creatinine:  1.3 (12/15/2009)   Albumin:  3.9 (12/15/2009)   Total Protein:  6.3 (12/15/2009)    Calcium:  8.8 (12/15/2009)   Total Bili:  0.8 (12/15/2009)   Alk Phos:  62 (12/15/2009)   SGPT (ALT):  18 (12/15/2009)   SGOT (AST):  27 (12/15/2009)   Medications Prior to Update: 1)  Amlodipine Besylate 10 Mg Tabs (Amlodipine Besylate) .Marland Kitchen.. 1 By Mouth Qd 2)  Aspirin 325 Mg Tabs (Aspirin) .Marland Kitchen.. 1 By Mouth Qd 3)  Flexeril 10 Mg Tabs (Cyclobenzaprine Hcl) .Marland Kitchen.. 1 By Mouth Three Times A Day Prn 4)  Hydrochlorothiazide 12.5 Mg Caps (Hydrochlorothiazide) .Marland Kitchen.. 1 By Mouth Qd 5)  Hydrocodone-Acetaminophen 5-325 Mg Tabs (Hydrocodone-Acetaminophen) .Marland Kitchen.. 1-2 By Mouth Q 6h Prn 6)  Labetalol Hcl 200 Mg Tabs (Labetalol Hcl) .Marland Kitchen.. 1 By Mouth Bid 7)  Benicar 40 Mg Tabs (Olmesartan Medoxomil) .Marland Kitchen.. 1 By Mouth Qd 8)  Zocor 40 Mg Tabs (Simvastatin) .Marland Kitchen.. 1 By Mouth Qd 9)  Nexium 40 Mg Cpdr (Esomeprazole Magnesium) .... One By Mouth Once Daily For Heartburn  Current Medications (verified): 1)  Amlodipine Besylate 10 Mg Tabs (Amlodipine Besylate) .Marland Kitchen.. 1 By Mouth Qd 2)  Aspirin 325 Mg Tabs (Aspirin) .Marland Kitchen.. 1 By Mouth Qd 3)  Flexeril 10 Mg Tabs (Cyclobenzaprine Hcl) .Marland Kitchen.. 1 By Mouth Three Times A Day Prn 4)  Hydrochlorothiazide 12.5 Mg Caps (Hydrochlorothiazide) .Marland Kitchen.. 1 By Mouth Qd 5)  Hydrocodone-Acetaminophen 5-325 Mg Tabs (Hydrocodone-Acetaminophen) .Marland Kitchen.. 1-2 By Mouth Q 6h Prn 6)  Labetalol Hcl 200 Mg Tabs (Labetalol Hcl) .Marland Kitchen.. 1 By Mouth Bid 7)  Benicar 40 Mg Tabs (Olmesartan Medoxomil) .Marland Kitchen.. 1 By Mouth Qd 8)  Zocor 40 Mg Tabs (Simvastatin) .Marland Kitchen.. 1 By Mouth Qd 9)  Nexium 40 Mg Cpdr (Esomeprazole Magnesium) .... One By Mouth Once Daily For Heartburn 10)  Cialis 20 Mg Tabs (Tadalafil) .... Take On As Directed  Allergies (verified): 1)  ! Lisinopril  Past History:  Past Medical History: Last updated: 10/15/2009 Headache Hyperlipidemia Hypertension Myocardial infarction, hx of Cerebrovascular accident, hx of  Past Surgical History: Last updated: 10/15/2009 Hernia 1988  Family History: Last updated:  12/15/2009 Family History High cholesterol Family History Hypertension Family History of Prostate CA 1st degree relative <50  Social History: Last updated: 10/15/2009 Occupation:  Plumber Current Smoker Alcohol use-no Drug use-no  Risk Factors: Alcohol Use: 0 (08/12/2010)  Risk Factors: Smoking Status: current (08/12/2010) Packs/Day: 1.0 (08/12/2010)  Family History: Reviewed history from 12/15/2009 and no changes required. Family History High cholesterol Family History Hypertension Family History of Prostate CA 1st degree relative <50  Social History: Reviewed history from 10/15/2009 and no changes required. Occupation:  Nutritional therapist Current Smoker Alcohol use-no Drug use-no  Review of Systems  The patient denies anorexia,  fever, weight loss, weight gain, chest pain, syncope, dyspnea on exertion, peripheral edema, prolonged cough, headaches, hemoptysis, abdominal pain, melena, hematochezia, severe indigestion/heartburn, hematuria, suspicious skin lesions, transient blindness, difficulty walking, depression, unusual weight change, abnormal bleeding, enlarged lymph nodes, and angioedema.   GU:  Complains of erectile dysfunction; denies decreased libido, discharge, dysuria, genital sores, hematuria, incontinence, nocturia, urinary frequency, and urinary hesitancy. Neuro:  Denies difficulty with concentration, disturbances in coordination, falling down, headaches, inability to speak, memory loss, numbness, poor balance, seizures, sensation of room spinning, tingling, and weakness.  Physical Exam  General:  alert, well-developed, well-nourished, well-hydrated, appropriate dress, normal appearance, healthy-appearing, cooperative to examination, and good hygiene.   Head:  normocephalic, atraumatic, no abnormalities observed, and no abnormalities palpated.   Mouth:  Oral mucosa and oropharynx without lesions or exudates.  Teeth in good repair. Neck:  supple, full ROM, no masses, no  thyromegaly, no thyroid nodules or tenderness, no JVD, normal carotid upstroke, no carotid bruits, no cervical lymphadenopathy, and no neck tenderness.   Lungs:  normal respiratory effort, no intercostal retractions, no accessory muscle use, normal breath sounds, no dullness, no fremitus, no crackles, and no wheezes.   Heart:  normal rate, regular rhythm, no murmur, no gallop, no rub, and no JVD.   Abdomen:  soft, non-tender, normal bowel sounds, no distention, no masses, no guarding, no rigidity, no rebound tenderness, no abdominal hernia, no inguinal hernia, no hepatomegaly, and no splenomegaly.   Msk:  normal ROM, no joint tenderness, no joint swelling, no joint warmth, no redness over joints, no joint deformities, no joint instability, no crepitation, and no muscle atrophy.   Pulses:  R and L carotid,radial,femoral,dorsalis pedis and posterior tibial pulses are full and equal bilaterally Extremities:  No clubbing, cyanosis, edema, or deformity noted with normal full range of motion of all joints.   Neurologic:  No cranial nerve deficits noted. Station and gait are normal. Plantar reflexes are down-going bilaterally. DTRs are symmetrical throughout. Sensory, motor and coordinative functions appear intact. Skin:  turgor normal, color normal, no rashes, no suspicious lesions, no ecchymoses, no petechiae, no purpura, no ulcerations, and no edema.   Cervical Nodes:  no anterior cervical adenopathy and no posterior cervical adenopathy.   Psych:  Cognition and judgment appear intact. Alert and cooperative with normal attention span and concentration. No apparent delusions, illusions, hallucinations   Impression & Recommendations:  Problem # 1:  ERECTILE DYSFUNCTION (ICD-302.72) Assessment New  His updated medication list for this problem includes:    Cialis 20 Mg Tabs (Tadalafil) .Marland Kitchen... Take on as directed  Discussed proper use of medications, as well as side effects.   Problem # 2:  HYPERTENSION  (ICD-401.9) Assessment: Improved  His updated medication list for this problem includes:    Amlodipine Besylate 10 Mg Tabs (Amlodipine besylate) .Marland Kitchen... 1 by mouth qd    Hydrochlorothiazide 12.5 Mg Caps (Hydrochlorothiazide) .Marland Kitchen... 1 by mouth qd    Labetalol Hcl 200 Mg Tabs (Labetalol hcl) .Marland Kitchen... 1 by mouth bid    Benicar 40 Mg Tabs (Olmesartan medoxomil) .Marland Kitchen... 1 by mouth qd  BP today: 108/70 Prior BP: 132/78 (12/15/2009)  Prior 10 Yr Risk Heart Disease: N/A (10/15/2009)  Labs Reviewed: K+: 4.0 (12/15/2009) Creat: : 1.3 (12/15/2009)   Chol: 166 (12/15/2009)   HDL: 40.30 (12/15/2009)   LDL: 116 (12/15/2009)   TG: 48.0 (12/15/2009)  Problem # 3:  CEREBROVASCULAR ACCIDENT, HX OF (ICD-V12.50) Assessment: Unchanged  Problem # 4:  MYOCARDIAL INFARCTION, HX OF (ICD-412) Assessment: Unchanged  His updated medication list for this problem includes:    Amlodipine Besylate 10 Mg Tabs (Amlodipine besylate) .Marland Kitchen... 1 by mouth qd    Aspirin 325 Mg Tabs (Aspirin) .Marland Kitchen... 1 by mouth qd    Hydrochlorothiazide 12.5 Mg Caps (Hydrochlorothiazide) .Marland Kitchen... 1 by mouth qd    Labetalol Hcl 200 Mg Tabs (Labetalol hcl) .Marland Kitchen... 1 by mouth bid    Benicar 40 Mg Tabs (Olmesartan medoxomil) .Marland Kitchen... 1 by mouth qd  Labs Reviewed: Chol: 166 (12/15/2009)   HDL: 40.30 (12/15/2009)   LDL: 116 (12/15/2009)   TG: 48.0 (12/15/2009)  Lipid Goals: Chol Goal: 200 (10/15/2009)   HDL Goal: 40 (10/15/2009)   LDL Goal: 70 (10/15/2009)   TG Goal: 150 (10/15/2009)  Complete Medication List: 1)  Amlodipine Besylate 10 Mg Tabs (Amlodipine besylate) .Marland Kitchen.. 1 by mouth qd 2)  Aspirin 325 Mg Tabs (Aspirin) .Marland Kitchen.. 1 by mouth qd 3)  Flexeril 10 Mg Tabs (Cyclobenzaprine hcl) .Marland Kitchen.. 1 by mouth three times a day prn 4)  Hydrochlorothiazide 12.5 Mg Caps (Hydrochlorothiazide) .Marland Kitchen.. 1 by mouth qd 5)  Hydrocodone-acetaminophen 5-325 Mg Tabs (Hydrocodone-acetaminophen) .Marland Kitchen.. 1-2 by mouth q 6h prn 6)  Labetalol Hcl 200 Mg Tabs (Labetalol hcl) .Marland Kitchen.. 1 by mouth  bid 7)  Benicar 40 Mg Tabs (Olmesartan medoxomil) .Marland Kitchen.. 1 by mouth qd 8)  Zocor 40 Mg Tabs (Simvastatin) .Marland Kitchen.. 1 by mouth qd 9)  Nexium 40 Mg Cpdr (Esomeprazole magnesium) .... One by mouth once daily for heartburn 10)  Cialis 20 Mg Tabs (Tadalafil) .... Take on as directed  Lipid Assessment/Plan:      Based on NCEP/ATP III, the patient's risk factor category is "history of coronary disease, peripheral vascular disease, cerebrovascular disease, or aortic aneurysm along with either diabetes, current smoker, or LDL > 130 plus HDL < 40 plus triglycerides > 200".  The patient's lipid goals are as follows: Total cholesterol goal is 200; LDL cholesterol goal is 70; HDL cholesterol goal is 40; Triglyceride goal is 150.    Patient Instructions: 1)  Please schedule a follow-up appointment in 4 months. 2)  It is important that you exercise regularly at least 20 minutes 5 times a week. If you develop chest pain, have severe difficulty breathing, or feel very tired , stop exercising immediately and seek medical attention. 3)  Take an Aspirin every day. 4)  Check your Blood Pressure regularly. If it is above 130/80: you should make an appointment. Prescriptions: CIALIS 20 MG TABS (TADALAFIL) Take on as directed  #3 x 11   Entered and Authorized by:   Etta Grandchild MD   Signed by:   Etta Grandchild MD on 08/12/2010   Method used:   Print then Give to Patient   RxID:   [id number]    Orders Added: 1)  Est. Patient Level IV [25956]

## 2010-10-16 NOTE — H&P (Signed)
NAMEFAUSTO, [name]            ACCOUNT NO.:  [id number]   MEDICAL RECORD NO.:  [id number]          PATIENT TYPE:  INP   LOCATION:  1828                         FACILITY:  [facility]   PHYSICIAN:  Bevelyn Buckles. Champey, M.D.DATE OF BIRTH:  1961-01-01   DATE OF ADMISSION:  02/20/2006  DATE OF DISCHARGE:                                HISTORY & PHYSICAL   REASON FOR ADMISSION:  Code Stroke.   REQUESTING PHYSICIAN:  Dr. Ignacia Palma   HISTORY OF PRESENT ILLNESS:  Mr. [name] is a 50 year old African-American  male with past medical history of hypertension who presents with acute onset  today while at church of left-sided weakness and numbness and headache.  The  patient felt numbness and discomfort primarily in the upper greater than  lower extremity and face suddenly on the left.  He had a similar episode in  the past that resolved within an hour.  He also complains of some dizziness  with the onset of symptoms.  He does complain of bilateral frontal headache  with symptom onset.  His symptoms have been stable without any improvement.  He denies any vision changes, speech changes, swallowing problems, chewing  problems, or loss of consciousness.   PAST MEDICAL HISTORY:  Positive for hypertension.   CURRENT MEDICATIONS:  Includes atenolol.   ALLERGIES:  To a particular BLOOD PRESSURE MEDICINE which is unknown at this  time.   FAMILY HISTORY:  Positive for hypertension.   SOCIAL HISTORY:  Lives with his wife.  Denies any alcohol, tobacco or drug  use.   REVIEW OF SYSTEMS:  Positive as per HPI.  Review of systems negative in  greater than seven other organ systems.   EXAMINATION:  VITAL SIGNS:  Blood pressure is 149/84, pulse is 45-57,  respirations 16, temperature is 97.1.  HEENT:  Normocephalic, atraumatic.  Extraocular muscles are intact.  Pupils  equal, round, and reactive to light.  NECK:  Supple, no carotid bruits.  HEART:  Regular.  LUNGS:  Clear.  ABDOMEN:  Soft,  nontender.  EXTREMITIES:  Show good pulses.  NEUROLOGIC:  The patient is awake, alert and oriented.  Language is fluent.  Memory is within normal limits.  Cranial nerves II-XII are grossly intact.  Motor examination shows slight left upper greater than lower extremity drift  and slight weakness on the left at 4/5 to 4+/5 strength.  Sensory  examination is decreased on the left side when compared to the right.  Reflexes are trace to 1+ throughout.  Toes are neutral bilaterally.  Cerebellar function is normal on finger-to-nose and heel-to-shin.  Gait is  not assessed secondary to safety.   CT of the head showed no acute bleed or infarct and questionable left  frontal hypointensity.  MRI limited showed no acute infarct.   LABORATORIES:  Wbc's 5.1, hemoglobin 13.1, hematocrit is 38.3, platelets  262.  PT is 14.0, INR is 1.0, PTT is 32.  Sodium is 134, potassium is 3.3,  chloride is 103, CO2 is 25, BUN 12, creatinine is 1.5, glucose 87.  LFTs are  within normal limits.  Calcium is 9.7.   IMPRESSION:  This  is a 50 year old African-American male with past medical  history of hypertension who presents with acute left-sided weakness and  numbness, NIH stroke scale of 3.  Discussed with the patient and family  about acute stroke and treatment with IV tPA and the risks and benefits.  Will proceed with full-dose tPA.  Admit the patient to the stroke M.D.  service and 24 hours in ICU setting.  The patient will need antiplatelet  therapy after 24 hours post tPA.  Will check an MRI/MRA of the brain, 2-D  echocardiogram, carotid Dopplers, lipids.  Will get PT/OT consults.  Keep  the patient n.p.o. until he clears swallow evaluation.  Place the patient on  IV fluids.  Monitor his blood pressure and keep his systolic blood pressure  less than 180.  Use hydralazine p.r.n. as the patient has a low heart rate.  Will follow the patient while he is in the hospital.      Bevelyn Buckles. Nash Shearer, M.D.   Electronically Signed     DRC/MEDQ  D:  02/20/2006  T:  02/22/2006  Job:  [id number]

## 2010-10-16 NOTE — Consult Note (Signed)
[name], BOODY NO.:  [id number]   MEDICAL RECORD NO.:  [id number]          PATIENT TYPE:  INP   LOCATION:  2622                         FACILITY:  [facility]   PHYSICIAN:  Lyn Records, M.D.   DATE OF BIRTH:  Dec 06, 1960   DATE OF CONSULTATION:  02/21/2006  DATE OF DISCHARGE:                                   CONSULTATION   REASON FOR CONSULTATION:  Bradycardia.   CONCLUSION:  1. Asymptomatic sinus bradycardia, likely secondary to the beta blocking      effects of Atenolol.  Rule out hypothyroidism.  Rule out sinus node      dysfunction.  2. Essential hypertension, known for greater than five years.      a.     Left ventricular hypertrophy by EKG.  3. Right subcortical cerebrovascular accident, acute.  Presentation on      02/20/06.      a.     Intravenous tissue plasminogen activator.   RECOMMENDATIONS:  1. Rule out hypothyroidism.  2. Hold beta blocker therapy and then resume at a lower dose after heart      rate is increased above 50.  3. Use antihypertensive agents that do not further slow the heart rate,      i.e. diuretics, ACE inhibitor therapy ARB therapy for dihydropyridine      calcium channel blocker therapy.  4. 2D echocardiogram and if LVH is documented, more aggressive blood      pressure control is obviously needed.  5. Monitor until resolution of bradycardia, i.e. heart rate greater than      50.   COMMENTS:  This is a very pleasant 50 year old gentleman who began having  left hemi-sensory and motor difficulty while at church on 02/20/06.  He  received intravenous tissue plasminogen activator as code stroke patient.  This morning he is doing quite well and has only minimal tingling on his  left side.  He has a history of hypertension, treated for greater than three  years.  His medication is Atenolol 100 mg per day.  He did take this  medication on the a.m. of 02/20/06.  We are consulted because of bradycardia  noted on EKG.  The patient  is asymptomatic with reference to bradycardia.   ALLERGIES:  None specifically stated.   HABITS:  Does not smoke or drink.   SOCIAL HISTORY:  Positive for hypertension and vascular complications in his  mother, father and grandmother.   PAST MEDICAL HISTORY:  Hypertension.   PHYSICAL EXAMINATION:  GENERAL:  The patient is lying comfortably.  He is in  no acute distress.  VITAL SIGNS:  His blood pressure is 138/70, heart rate is 40.  SKIN:  Warm and dry.  HEENT:  Pupils equal, round and reactive to light.  Extraocular movements  are full.  NECK:  No carotid bruit.  CARDIAC EXAM:  Normal.  No murmur, no rub, no click, no gallop is heard.  ABDOMEN:  Soft.  EXTREMITIES:  No edema.  NEUROLOGIC:  Grossly intact.   EKG reveals left ventricular hypertrophy with secondary ST-T wave  abnormality.  Sinus bradycardia is  severe at 41 beats per minute. Chest x-  ray pending.   LABORATORY DATA:  Of significance includes hemoglobin of 13.1, BUN and  creatinine of 12 and 1.3, potassium 3.3.  LDL cholesterol 158.  Glycosylated  hemoglobin and drug screen were negative.  Homocystine level is pending.   DISCUSSION:  The patient has asymptomatic sinus bradycardia secondary to  relatively high dose beta blocker therapy for hypertension.  We will need to  hold beta blocker therapy until heart rate consistently increases above 50.  He will likely need to have his beta blocker dose decreased somewhat and  other antibiotic-hypertensive agents added to achieve blood pressure goal of  130/85 or less.  Given his race, low dose diuretic therapy and  dihydropyridine calcium channel blocker therapy should be considered.      Lyn Records, M.D.  Electronically Signed     HWS/MEDQ  D:  02/21/2006  T:  02/21/2006  Job:  [id number]   cc:   Holley Bouche, M.D.

## 2010-11-19 ENCOUNTER — Other Ambulatory Visit: Payer: Self-pay | Admitting: Internal Medicine

## 2011-01-23 ENCOUNTER — Other Ambulatory Visit: Payer: Self-pay | Admitting: Internal Medicine

## 2011-03-04 ENCOUNTER — Observation Stay (HOSPITAL_COMMUNITY)
Admission: EM | Admit: 2011-03-04 | Discharge: 2011-03-05 | Disposition: A | Discharge status: Home / Self Care | Payer: 59 | Attending: Interventional Cardiology | Admitting: Interventional Cardiology

## 2011-03-04 DIAGNOSIS — Z8673 Personal history of transient ischemic attack (TIA), and cerebral infarction without residual deficits: Secondary | ICD-10-CM | POA: Insufficient documentation

## 2011-03-04 DIAGNOSIS — E785 Hyperlipidemia, unspecified: Secondary | ICD-10-CM | POA: Insufficient documentation

## 2011-03-04 DIAGNOSIS — R079 Chest pain, unspecified: Secondary | ICD-10-CM

## 2011-03-04 DIAGNOSIS — F17200 Nicotine dependence, unspecified, uncomplicated: Secondary | ICD-10-CM | POA: Insufficient documentation

## 2011-03-04 DIAGNOSIS — R0609 Other forms of dyspnea: Secondary | ICD-10-CM | POA: Insufficient documentation

## 2011-03-04 DIAGNOSIS — I2510 Atherosclerotic heart disease of native coronary artery without angina pectoris: Secondary | ICD-10-CM

## 2011-03-04 DIAGNOSIS — I422 Other hypertrophic cardiomyopathy: Secondary | ICD-10-CM | POA: Insufficient documentation

## 2011-03-04 DIAGNOSIS — R5381 Other malaise: Secondary | ICD-10-CM | POA: Insufficient documentation

## 2011-03-04 DIAGNOSIS — R0989 Other specified symptoms and signs involving the circulatory and respiratory systems: Secondary | ICD-10-CM | POA: Insufficient documentation

## 2011-03-04 DIAGNOSIS — I10 Essential (primary) hypertension: Secondary | ICD-10-CM | POA: Insufficient documentation

## 2011-03-04 LAB — COMPREHENSIVE METABOLIC PANEL
ALT: 13 U/L (ref 0–53)
AST: 22 U/L (ref 0–37)
Albumin: 4.6 g/dL (ref 3.5–5.2)
Alkaline Phosphatase: 85 U/L (ref 39–117)
Potassium: 3.8 mEq/L (ref 3.5–5.1)
Sodium: 138 mEq/L (ref 135–145)
Total Protein: 8.1 g/dL (ref 6.0–8.3)

## 2011-03-04 LAB — DIFFERENTIAL
Eosinophils Absolute: 0.3 10*3/uL (ref 0.0–0.7)
Eosinophils Relative: 4 % (ref 0–5)
Lymphs Abs: 2.4 10*3/uL (ref 0.7–4.0)
Monocytes Absolute: 0.4 10*3/uL (ref 0.1–1.0)
Monocytes Relative: 5 % (ref 3–12)

## 2011-03-04 LAB — CBC
MCV: 85.3 fL (ref 78.0–100.0)
Platelets: 259 10*3/uL (ref 150–400)
RBC: 4.64 MIL/uL (ref 4.22–5.81)
WBC: 7.1 10*3/uL (ref 4.0–10.5)

## 2011-03-04 LAB — POCT I-STAT TROPONIN I: Troponin i, poc: 0.06 ng/mL (ref 0.00–0.08)

## 2011-03-05 LAB — BASIC METABOLIC PANEL
BUN: 20 mg/dL (ref 6–23)
CO2: 28 mEq/L (ref 19–32)
Chloride: 106 mEq/L (ref 96–112)
Creatinine, Ser: 1.26 mg/dL (ref 0.50–1.35)

## 2011-03-05 LAB — LIPID PANEL
Cholesterol: 178 mg/dL (ref 0–200)
HDL: 40 mg/dL (ref >39)
Total CHOL/HDL Ratio: 4.5 RATIO

## 2011-03-05 LAB — CARDIAC PANEL(CRET KIN+CKTOT+MB+TROPI)
CK, MB: 2.6 ng/mL (ref 0.3–4.0)
Relative Index: 1.1 (ref 0.0–2.5)
Relative Index: 1.1 (ref 0.0–2.5)
Total CK: 262 U/L — ABNORMAL HIGH (ref 7–232)
Troponin I: <0.3 ng/mL (ref <0.30)
Troponin I: <0.3 ng/mL (ref <0.30)

## 2011-03-05 LAB — CBC
HCT: 37.6 % — ABNORMAL LOW (ref 39.0–52.0)
MCV: 85.8 fL (ref 78.0–100.0)
RBC: 4.38 MIL/uL (ref 4.22–5.81)
WBC: 6.1 10*3/uL (ref 4.0–10.5)

## 2011-03-05 LAB — TSH: TSH: 1.894 u[IU]/mL (ref 0.350–4.500)

## 2011-03-11 NOTE — H&P (Signed)
[name], [name] NO.:  [id number]  MEDICAL RECORD NO.:  [id number]  LOCATION:  [facility]                         FACILITY:  [facility]  PHYSICIAN:  Rollene Rotunda, MD, FACCDATE OF BIRTH:  02-Oct-1960  DATE OF ADMISSION:  03/04/2011 DATE OF DISCHARGE:                             HISTORY & PHYSICAL   PRIMARY CARE PHYSICIAN:  [name] Linger, MD  CARDIOLOGIST:  Dr. Verdis English.  REASON FOR PRESENTATION:  Evaluate the patient with chest pain and known coronary artery disease.  HISTORY OF PRESENT ILLNESS:  The patient is pleasant 50 year old gentleman with a history of coronary artery disease.  He was managed medically at the time of the last cath and the anatomy is described below.  He now presents with the same discomfort he had last year when he had his catheterization.  It is a pressure-like discomfort.  He points to an area over his left breast.  It happens with walking through his work site and it also happens at rest.  It has been getting more intense over the last 3 days.  He is not having any radiation to his neck or to his arms.  He is getting more short of breath with this and that is why he came in today.  He has been having at rest throughout the day.  It might last for 5-10 minutes.  It might become an 8/10 in intensity.  He does not have any associated nausea, vomiting, or diaphoresis.  He is not having any new palpitations, presyncope, or syncope.  He will occasionally feel tachy palpitations, but this has been a chronic problem.  He continues to smoke cigarettes.  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  PAST SURGICAL HISTORY:  Right inguinal hernia repair.  ALLERGIES:  None.  MEDICATIONS:  Aspirin, Benicar 40 mg daily, labetalol 200 mg b.i.d., Norvasc 10 mg daily, hydrochlorothiazide 12.5 mg daily, and  simvastatin 40 mg daily.  SOCIAL HISTORY:  He has been smoking up to a pack per day for 27 years and smokes when he is stressed.  He is married.  He has 3 adult children and 11 grandchildren.  FAMILY HISTORY:  Contributory for hypertension.  REVIEW OF SYSTEMS:  Positive for occasional dizziness, constipation. Negative for all other systems.  PHYSICAL EXAMINATION:  GENERAL:  The patient is in no distress. VITAL SIGNS:  Blood pressure 150/88, heart rate 78 and regular, afebrile, respiratory rate 18. HEENT:  Eyes are unremarkable.  Pupils are equal, round, and reactive, fundi not visualized, oral mucosa unremarkable. NECK:  No jugular venous distention at 45 degrees, carotid upstroke brisk and symmetric, no bruits, no thyromegaly. LYMPHATICS:  No cervical, axillary, or inguinal adenopathy. LUNGS:  Clear to auscultation bilaterally. BACK:  No costovertebral angle tenderness. CHEST:  Unremarkable. HEART:  PMI not displaced or sustained, S1 and S2 within normal limits. No S3, S4, no clicks, rubs, or murmurs. ABDOMEN:  Flat, positive bowel  sounds normal in frequency and pitch, no bruits, rebound, guarding, or midline pulsatile mass, no hepatomegaly, no splenomegaly. SKIN:  No rashes, no nodules. EXTREMITIES:  2+ pulses throughout, no edema, no cyanosis, no clubbing. NEURO:  Oriented to person, place, and time.  Cranial nerves II through XII are grossly intact, motor grossly intact.  LABORATORY DATA:  EKG sinus rhythm, rate 87, axis within normal limits, intervals within normal limits, left ventricle hypertrophy with lateral T-wave inversions with inferolateral T-wave inversion has not changed from previous.  Labs, WBC 7.1, hemoglobin 13.6, platelets 259,  sodium 138, potassium 3.8, BUN 21, creatinine 1.22.  ASSESSMENT/PLAN: 1. Chest, the patient's chest comfort is worrisome for unstable     angina.  He has ongoing risk factors.  Given this, I will put him     in the hospital for aspirin and IV heparin.  Enzymes will be     cycled.  He will need cardiac catheterization, but I will defer     ultimate decision to Dr. Katrinka Blazing.  Of note, he had a radial case     before that went well apparently.  He needs aggressive risk     reduction. 2. Tobacco abuse.  We discussed this at great length.  His insurance     would not cover Chantix.  He will consider cold [place], but I will     get a smoking cessation consult. 3. Hypertension.  I will prescribe medications as listed and these     might need to be adjusted. 4. Dyslipidemia.  Check lipid profile.     Rollene Rotunda, MD, [facility]     JH/MEDQ  D:  03/04/2011  T:  03/05/2011  Job:  [id number]  Electronically Signed by Rollene Rotunda MD [facility] on 03/11/2011 01:40:04 PM

## 2011-03-11 NOTE — Discharge Summary (Signed)
  [name], MILOSEVIC NO.:  [id number]  MEDICAL RECORD NO.:  [id number]  LOCATION:  2038                         FACILITY:  [facility]  PHYSICIAN:  Lyn Records, M.D.   DATE OF BIRTH:  25-Jan-1961  DATE OF ADMISSION:  03/04/2011 DATE OF DISCHARGE:  03/05/2011                              DISCHARGE SUMMARY   REASON FOR ADMISSION TO THE HOSPITAL:  Chest pain.  DISCHARGE DIAGNOSES: 1. Chest pain with no evidence of myocardial infarction.  It is felt     that chest pain is likely related to the patient's known     hypertrophic cardiomyopathy. 2. 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     a.     Chest pain.     b.     Dyspnea. 3. Hypertension. 4. Tobacco abuse.  PROCEDURES PERFORMED:  None.  DISCHARGE INSTRUCTIONS:  The patient will wear a 14-day continuous ambulatory monitor to rule out arrhythmias as a potential source of his current complaints.  The medication regimen is unchanged and include the following therapies: 1. Amlodipine 10 mg every day. 2. Aspirin 325 mg every day. 3. Benicar 40 mg daily. 4. Hydrochlorothiazide 12.5 mg daily. 5. Labetalol 200 mg twice daily. 6. Zocor 40 mg daily.  ACTIVITY: 1. As tolerated. 2. If any recurrence of significant or prolonged chest pain, the     patient should return to the emergency room. 3. Office followup in 7-10 days. 4. No limitations unless physical activity causes symptoms.  HISTORY AND PHYSICAL AND HOSPITAL COURSE:  The patient is 50 years of age and had mild-to-moderate coronary disease by angiography in 2011. He was also diagnosed at that time as having an apical hypertrophic cardiomyopathy.  He presents at this time with a 4-5 day history of exertional dyspnea and intermittent chest tightness.  The symptoms are occurring both at rest and while active.  The episodes last several minutes, then resolve.  They can recur repeatedly.  There is no diaphoresis, syncope or  palpitations associated.  Cardiac markers were all negative for evidence of ischemia.  Other laboratory data were unremarkable.  During sleep, he had one episode of high-grade AV block.  The total duration of the episode was less than 5 seconds.  No symptoms occurred.  The patient states that he is compliant with the current medical regimen.  His wife has told the nursing staff, otherwise.  The patient has been discharged from our practice, however, I will work to reestablish a relationship with the patient and open up slots to be able to see him in the office in a week.  My office will set up the 2- week telemetry unit for the patient to wear.  Hope the compliance will be adequate.     Lyn Records, M.D.     HWS/MEDQ  D:  03/05/2011  T:  03/06/2011  Job:  [id number]  Electronically Signed by Verdis Prime M.D. on 03/11/2011 11:03:46 AM

## 2011-03-16 LAB — URINE MICROSCOPIC-ADD ON

## 2011-03-16 LAB — URINALYSIS, ROUTINE W REFLEX MICROSCOPIC
Bilirubin Urine: NEGATIVE
Glucose, UA: NEGATIVE
Ketones, ur: NEGATIVE
Nitrite: POSITIVE — AB
Protein, ur: NEGATIVE
Specific Gravity, Urine: 1.018
Urobilinogen, UA: 0.2
pH: 6.5

## 2011-03-16 LAB — URINE CULTURE: Colony Count: >=100000

## 2011-04-06 ENCOUNTER — Other Ambulatory Visit: Payer: Self-pay | Admitting: Internal Medicine

## 2011-10-12 ENCOUNTER — Other Ambulatory Visit (INDEPENDENT_AMBULATORY_CARE_PROVIDER_SITE_OTHER): Payer: 59

## 2011-10-12 ENCOUNTER — Encounter: Payer: Self-pay | Admitting: Internal Medicine

## 2011-10-12 ENCOUNTER — Ambulatory Visit (INDEPENDENT_AMBULATORY_CARE_PROVIDER_SITE_OTHER): Payer: 59 | Admitting: Internal Medicine

## 2011-10-12 VITALS — BP 140/92 | HR 73 | Temp 98.3°F | Ht 68.0 in | Wt 212.4 lb

## 2011-10-12 DIAGNOSIS — R310 Gross hematuria: Secondary | ICD-10-CM

## 2011-10-12 DIAGNOSIS — I10 Essential (primary) hypertension: Secondary | ICD-10-CM

## 2011-10-12 DIAGNOSIS — I2510 Atherosclerotic heart disease of native coronary artery without angina pectoris: Secondary | ICD-10-CM

## 2011-10-12 LAB — URINALYSIS, ROUTINE W REFLEX MICROSCOPIC
Bilirubin Urine: NEGATIVE
Ketones, ur: NEGATIVE
Total Protein, Urine: NEGATIVE
Urine Glucose: NEGATIVE
Urobilinogen, UA: 0.2 (ref 0.0–1.0)

## 2011-10-12 LAB — PSA: PSA: 1.34 ng/mL (ref 0.10–4.00)

## 2011-10-12 MED ORDER — LABETALOL HCL 100 MG PO TABS: 100.0000 mg | ORAL_TABLET | Freq: Two times a day (BID) | ORAL | Status: DC

## 2011-10-12 NOTE — Patient Instructions (Addendum)
Please call back if your labetolol prescription is not 100 mg twice per day at [phone] Continue all other medications as before Please have the pharmacy call with any refills you may need. Please go to LAB in the Basement for the blood and/or urine tests to be done today You will be contacted regarding the referral for: urology

## 2011-10-12 NOTE — Progress Notes (Signed)
Subjective:    Patient ID: [name], male    DOB: [birth year], 51 y.o.   MRN: [id number]  HPI  Here to f/u, pt of Dr Yetta Barre and Dr Sherilyn Cooter Smith/card, on Asa 325 mg per day, c/o approx  3 episodes of BRB large amount per with blood clots, once per wk  For last 3 wks, but  Denies urinary symptoms such as dysuria, frequency, urgency, or n/v, fever.  Has had lower back pain, but to him seems off an on, mild, dull, located located lower lumbar with occas radiaition to bilat upper legs, worse to lie down longer periods to time so that getting up to move around might make better, ongoing now for about 3 wks as well, occuring about once per wk as well about the same times as the blood.  Has only had the blood each time just after an episode of intercourse, no blood when not after intercourse, with or without ejaculation involved.  No hx of renal stone, No hx of cancer GU or o/w; but have hx of prostatitis tx with antibx approx twice in the past.   Past Medical History  Diagnosis Date  . CAD (coronary artery disease) 10/12/2011    Mild to mod dz on cath 2011;  Dr Verdis Prime   No past surgical history on file.  does not have a smoking history on file. He does not have any smokeless tobacco history on file. His alcohol and drug histories not on file. family history is not on file. Allergies  Allergen Reactions  . Lisinopril    Current Outpatient Prescriptions on File Prior to Visit  Medication Sig Dispense Refill  . aspirin EC 325 MG tablet TAKE 1 TABLET EVERY DAY  30 tablet  5  . BENICAR 40 MG tablet TAKE 1 TABLET BY MOUTH EVERY DAY  30 tablet  4  . hydrochlorothiazide (,MICROZIDE/HYDRODIURIL,) 12.5 MG capsule TAKE 1 CAPSULE EVERY DAY  30 capsule  4  . simvastatin (ZOCOR) 40 MG tablet TAKE 1 TABLET BY MOUTH EVERY DAY  30 tablet  5  . labetalol (NORMODYNE) 100 MG tablet Take 1 tablet (100 mg total) by mouth 2 (two) times daily.  60 tablet  11   Review of Systems Review of Systems    Constitutional: Negative for diaphoresis and unexpected weight change.  HENT: Negative for drooling and tinnitus.   Eyes: Negative for photophobia and visual disturbance.  Respiratory: Negative for choking and stridor.   Gastrointestinal: Negative for vomiting and blood in stool.  Genitourinary: Negative for  decreased urine volume.  Musculoskeletal: Negative for gait problem.  Skin: Negative for color change and wound.  Neurological: Negative for tremors and numbness.  Psychiatric/Behavioral: Negative for decreased concentration. The patient is not hyperactive.       Objective:   Physical Exam BP 140/92  Pulse 73  Temp(Src) 98.3 F (36.8 C) (Oral)  Ht 5\' 8"  (1.727 m)  Wt 212 lb 6 oz (96.333 kg)  BMI 32.29 kg/m2  SpO2 98% Physical Exam  VS noted Constitutional: Pt appears well-developed and well-nourished.  HENT: Head: Normocephalic.  Right Ear: External ear normal.  Left Ear: External ear normal.  Eyes: Conjunctivae and EOM are normal. Pupils are equal, round, and reactive to light.  Neck: Normal range of motion. Neck supple.  Cardiovascular: Normal rate and regular rhythm.   Pulmonary/Chest: Effort normal and breath sounds normal.  Abd:  Soft, NT, non-distended, + BS Spine: nontender Neurological: Pt is alert. Not confused Skin: Skin is  warm. No erythema. No rash Psychiatric: Pt behavior is normal. Thought content normal. 1+ nervous    Assessment & Plan:

## 2011-10-12 NOTE — Assessment & Plan Note (Addendum)
With first urination post intercourse (with or without ejaculation) 3 times in past 3 wks, also assoc with mild transient LBP - suspect prostate issue, even chronic prostaitis related, currently asymp and afeb, exam above benign,  For GU studies to include UA and cx, and PSA, refer urology, may need cystoscopy, hold on back or GU imaging or antibx tx at this time,  to f/u any worsening symptoms or concerns

## 2011-10-14 LAB — URINE CULTURE: Colony Count: NO GROWTH

## 2011-10-16 ENCOUNTER — Encounter: Payer: Self-pay | Admitting: Internal Medicine

## 2011-10-16 NOTE — Assessment & Plan Note (Signed)
stable overall by hx and exam, most recent data reviewed with pt, and pt to continue medical treatment as before Lab Results  Component Value Date   LDLCALC 123* 03/05/2011   D/w pt - goal ldl < 70, declines change in tx at this time

## 2011-10-16 NOTE — Assessment & Plan Note (Signed)
stable overall by hx and exam, most recent data reviewed with pt, and pt to continue medical treatment as before BP Readings from Last 3 Encounters:  10/12/11 140/92  08/12/10 108/70  12/15/09 132/78

## 2011-10-18 IMAGING — CR DG CHEST 2V
2 series · 2 of 2 positions shown · non-contrast
Comparison: None.

CLINICAL DATA: Chest pain, shortness of breath.

CHEST - 2 VIEW

[w chest pa]
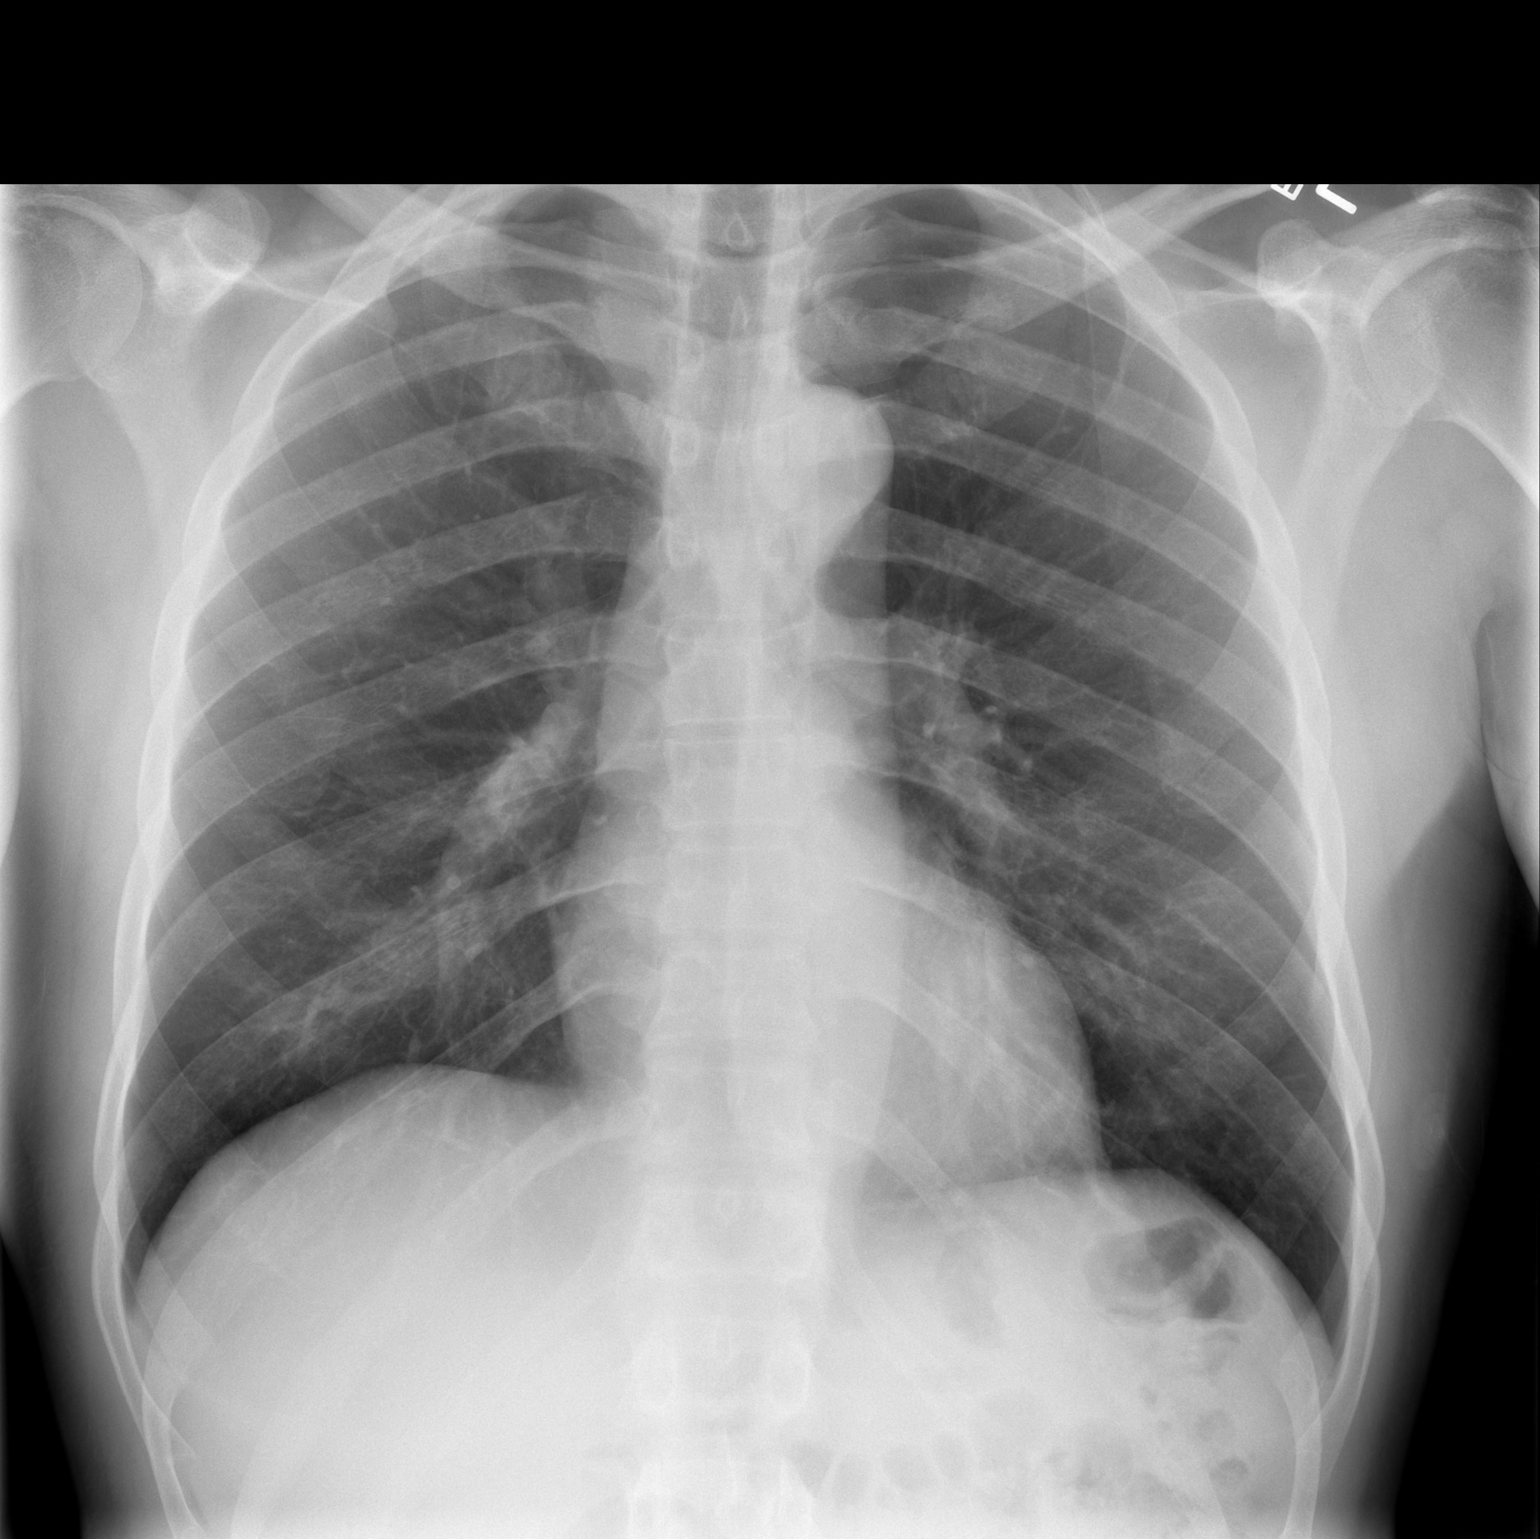

[w chest lat]
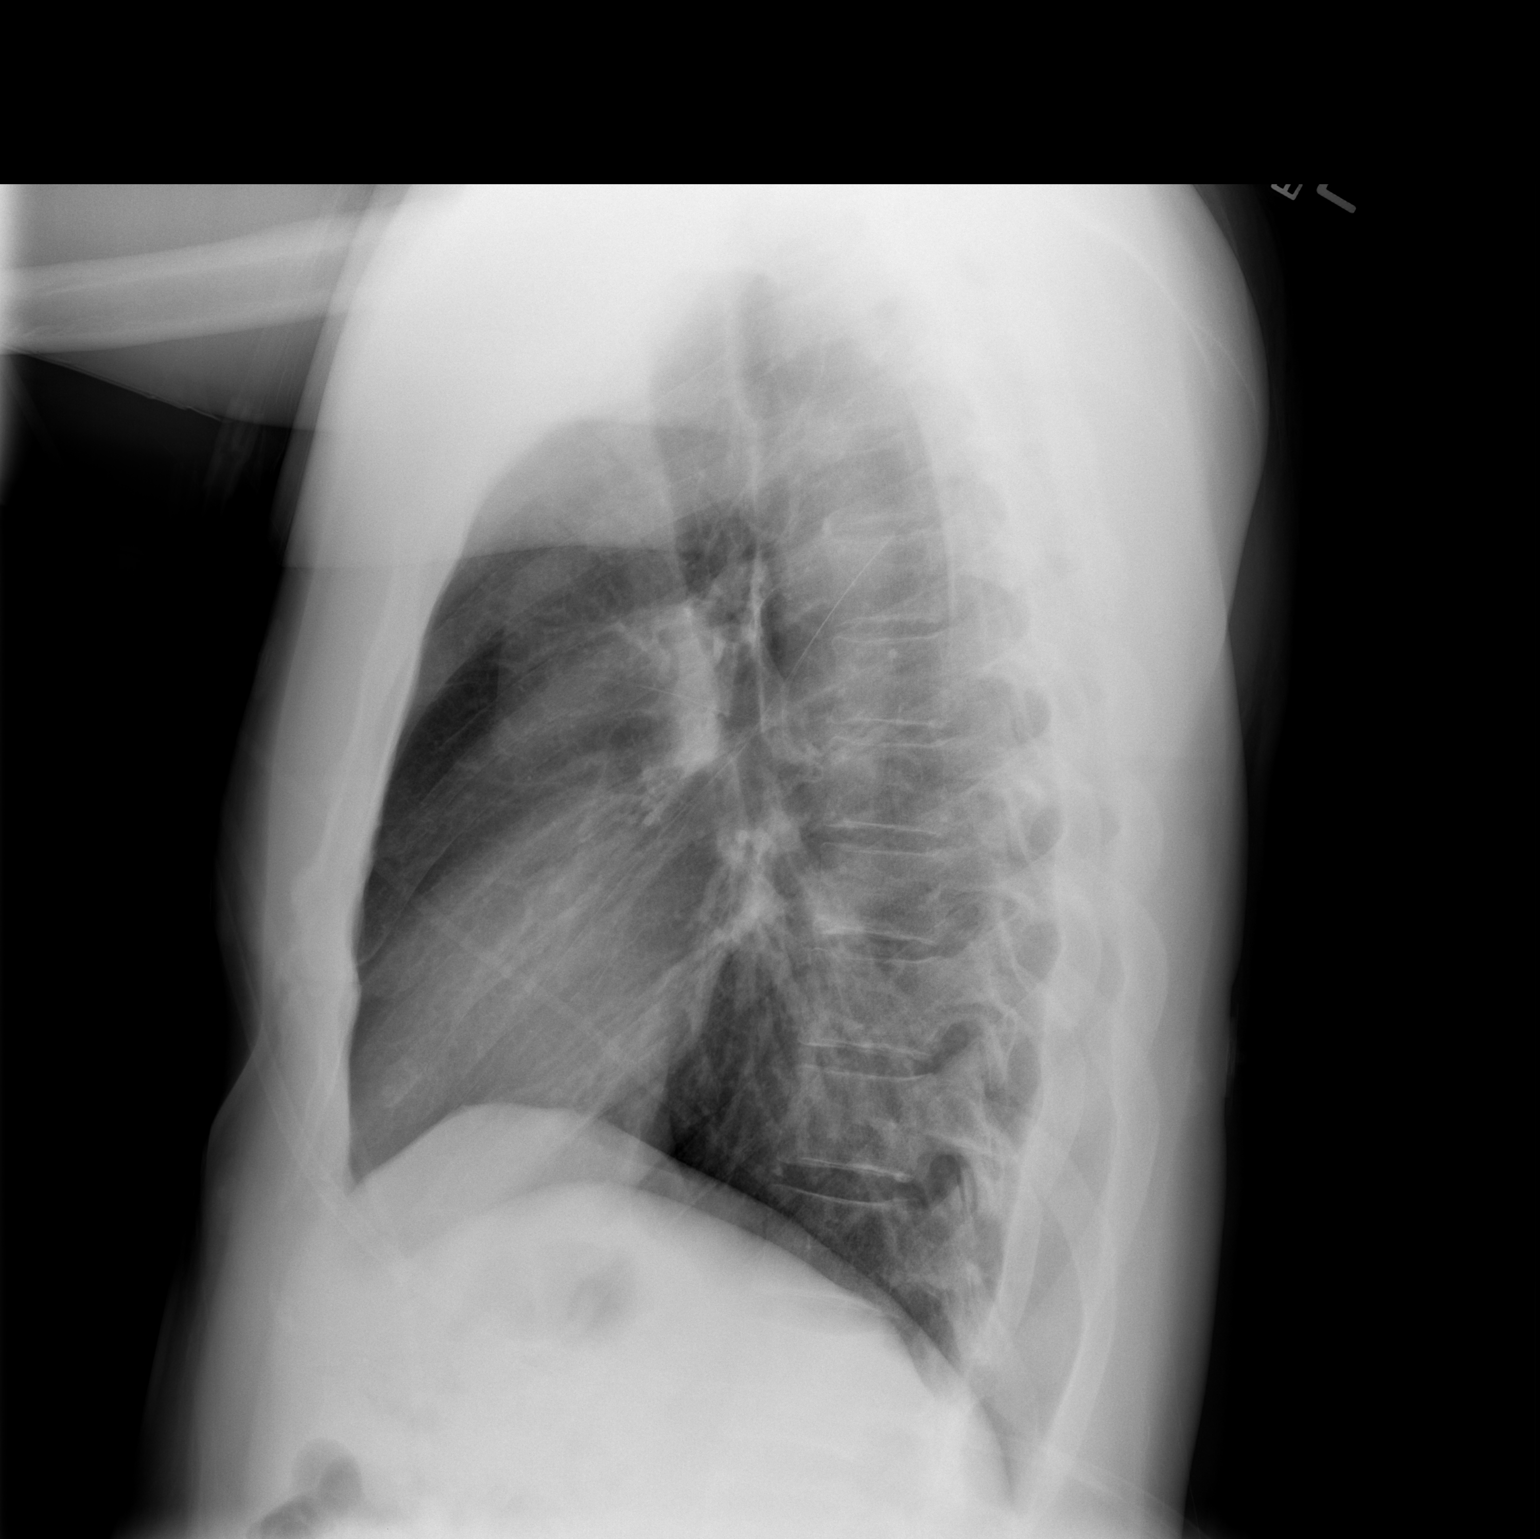

[2 of 2 positions shown; findings below may reference images not displayed]

FINDINGS: Heart and mediastinal contours are within normal limits.
No focal opacities or effusions.  No acute bony abnormality.
IMPRESSION: No acute cardiopulmonary disease.

## 2011-11-12 DIAGNOSIS — R42 Dizziness and giddiness: Secondary | ICD-10-CM | POA: Insufficient documentation

## 2011-11-12 DIAGNOSIS — I10 Essential (primary) hypertension: Secondary | ICD-10-CM | POA: Insufficient documentation

## 2011-11-12 DIAGNOSIS — H9190 Unspecified hearing loss, unspecified ear: Secondary | ICD-10-CM | POA: Insufficient documentation

## 2011-11-12 DIAGNOSIS — F17200 Nicotine dependence, unspecified, uncomplicated: Secondary | ICD-10-CM | POA: Insufficient documentation

## 2011-11-12 DIAGNOSIS — I2510 Atherosclerotic heart disease of native coronary artery without angina pectoris: Secondary | ICD-10-CM | POA: Insufficient documentation

## 2011-11-12 DIAGNOSIS — M4802 Spinal stenosis, cervical region: Secondary | ICD-10-CM | POA: Insufficient documentation

## 2011-11-13 ENCOUNTER — Emergency Department (HOSPITAL_COMMUNITY): Payer: 59

## 2011-11-13 ENCOUNTER — Encounter (HOSPITAL_COMMUNITY): Payer: Self-pay | Admitting: *Deleted

## 2011-11-13 ENCOUNTER — Emergency Department (HOSPITAL_COMMUNITY)
Admission: EM | Admit: 2011-11-13 | Discharge: 2011-11-13 | Disposition: A | Discharge status: Home / Self Care | Payer: 59 | Attending: Emergency Medicine | Admitting: Emergency Medicine

## 2011-11-13 DIAGNOSIS — H9191 Unspecified hearing loss, right ear: Secondary | ICD-10-CM

## 2011-11-13 LAB — BASIC METABOLIC PANEL
CO2: 25 mEq/L (ref 19–32)
Calcium: 9.5 mg/dL (ref 8.4–10.5)
Glucose, Bld: 95 mg/dL (ref 70–99)
Potassium: 4 mEq/L (ref 3.5–5.1)
Sodium: 139 mEq/L (ref 135–145)

## 2011-11-13 LAB — CBC
Hemoglobin: 12.8 g/dL — ABNORMAL LOW (ref 13.0–17.0)
MCH: 29.4 pg (ref 26.0–34.0)
MCV: 86.9 fL (ref 78.0–100.0)
Platelets: 242 10*3/uL (ref 150–400)
RBC: 4.36 MIL/uL (ref 4.22–5.81)
WBC: 5.4 10*3/uL (ref 4.0–10.5)

## 2011-11-13 MED ORDER — GADOBENATE DIMEGLUMINE 529 MG/ML IV SOLN
20.0000 mL | Freq: Once | INTRAVENOUS | Status: AC | PRN
  Administered 2011-11-13: 20 mL via INTRAVENOUS

## 2011-11-13 NOTE — ED Notes (Signed)
Pt has had "ringing in ear right all day,  With dizziness

## 2011-11-13 NOTE — ED Provider Notes (Signed)
History     CSN: [id number]  Arrival date & time 11/12/11  2343   First MD Initiated Contact with Patient 11/13/11 0228      Chief Complaint  Patient presents with  . Tinnitus  . Dizziness    (Consider location/radiation/quality/duration/timing/severity/associated sxs/prior treatment) HPI  Past Medical History  Diagnosis Date  . CAD (coronary artery disease) 10/12/2011    Mild to mod dz on cath 2011;  Dr Verdis Prime    History reviewed. No pertinent past surgical history.  History reviewed. No pertinent family history.  History  Substance Use Topics  . Smoking status: Never Smoker   . Smokeless tobacco: Not on file  . Alcohol Use: No      Review of Systems  Allergies  Lisinopril  Home Medications   Current Outpatient Rx  Name Route Sig Dispense Refill  . ASPIRIN EC 325 MG PO TBEC  TAKE 1 TABLET EVERY DAY 30 tablet 5  . BENICAR 40 MG PO TABS  TAKE 1 TABLET BY MOUTH EVERY DAY 30 tablet 4  . HYDROCHLOROTHIAZIDE 12.5 MG PO CAPS  TAKE 1 CAPSULE EVERY DAY 30 capsule 4  . LABETALOL HCL 100 MG PO TABS Oral Take 1 tablet (100 mg total) by mouth 2 (two) times daily. 60 tablet 11  . SIMVASTATIN 40 MG PO TABS  TAKE 1 TABLET BY MOUTH EVERY DAY 30 tablet 5    BP 149/87  Pulse 60  Temp 97.7 F (36.5 C) (Oral)  Resp 20  SpO2 96%  Physical Exam  ED Course  Procedures (including critical care time)  Labs Reviewed - No data to display Ct Head Wo Contrast  11/13/2011  *RADIOLOGY REPORT*  Clinical Data: Dizziness; right-sided tinnitus.  CT HEAD WITHOUT CONTRAST  Technique:  Contiguous axial images were obtained from the base of the skull through the vertex without contrast.  Comparison: CT of the head performed 04/05/2010, and limited MRI of the brain performed 02/20/2006  Findings: There is no evidence of acute infarction, mass lesion, or intra- or extra-axial hemorrhage on CT.  Diffuse subcortical white matter change is noted, more prominent than on the prior study.   Would correlate clinically for symptoms of demyelinating disease such as multiple sclerosis, and consider MRI for further evaluation.  The posterior fossa, including the cerebellum, brainstem and fourth ventricle, is within normal limits.  The third and lateral ventricles, and basal ganglia are unremarkable in appearance.  The cerebral hemispheres are symmetric in appearance, with normal gray- white differentiation.  No mass effect or midline shift is seen.  There is no evidence of fracture; visualized osseous structures are unremarkable in appearance.  The orbits are within normal limits. The paranasal sinuses and mastoid air cells are well-aerated.  No significant soft tissue abnormalities are seen.  IMPRESSION: Diffuse subcortical white matter change, worsened from 2011.  Would correlate clinically for symptoms of demyelinating disease such as multiple sclerosis, and consider MRI for further evaluation.  Original Report Authenticated By: Tonia Ghent, M.D.     1. Hearing loss in right ear     6:23 AM Handoff from [facility], pt with onset R hearing loss, abnormalities noted on CT. Awaiting MRI brain for further eval.   Vital signs reviewed and are as follows: Filed Vitals:   11/13/11 0320  BP: 149/87  Pulse: 60  Temp: 97.7 F (36.5 C)  Resp:    7:19 AM Patient is resting in bed awaiting MRI. States he first noticed decrease in hearing yesterday morning when  he tried to use a cellphone. Hearing continued to diminish throughout the day. Denies HA, weakness, change in sensation, trouble talking. Patient has had some difficulty with balance stating he feels dizzy when he walks.   Exam:  Gen NAD; Ears: R ear canal normal, no impaction; Heart RRR, nml S1,S2, no m/r/g; Lungs CTAB; Abd soft, NT, no rebound or guarding; Ext 2+ pedal pulses bilaterally, no edema; Neuro coordination normal, CN III-XII grossly intact other than hearing in R ear.      MRI was performed and reviewed by myself.  Findings were discussed with Dr. Denton Lank.   I spoke with Dr. Roseanne Reno of neurology and reviewed findings. His opinion it is unlikely that patient's symptoms are related to MRI findings. He recommends beginning with ENT follow-up for further evaluation of patient's findings.   Patient informed of this discussed and discharged to home. He was urged to follow-up with ENT referral and to return if he has any worsening of symptoms, weakness in arms/legs, trouble walking or talking. Patient and wife verbalize understanding and agree with plan.   MDM  MRI performed, findings reviewed with neurologist on-call. ENT referral given.          Renne Crigler, [place] 11/13/11 1402

## 2011-11-13 NOTE — ED Notes (Signed)
Report received from Night RN. First contact with patient. Pt is asleep in room, ABC intact, family member noted to be asleep as well at bedside. Will continue to monitor.

## 2011-11-13 NOTE — Discharge Instructions (Signed)
Please read and follow all provided instructions.  Your diagnoses today include:  1. Hearing loss in right ear     Tests performed today include:  CT of your head  MRI of your head - no stroke, diffuse white matter disease  Vital signs. See below for your results today.   Medications prescribed:   None  Home care instructions:  Follow any educational materials contained in this packet.  Follow-up with the Ear, Nose, and Throat doctor listed for further evaluation of your symptoms.   Follow-up instructions: Please follow-up with your primary care provider in the next 3 days for further evaluation of your symptoms.  If you do not have a primary care doctor -- see below for referral information.   Return instructions:   Please return to the Emergency Department if you experience worsening symptoms.   Please return if you have any other emergent concerns.  Additional Information:  Your vital signs today were: BP 163/72  Pulse 69  Temp 98.4 F (36.9 C) (Oral)  Resp 16  SpO2 99% If your blood pressure (BP) was elevated above 135/85 this visit, please have this repeated by your doctor within one month. -------------- No Primary Care Doctor Call [facility]  [phone] Other agencies that provide inexpensive medical care    [facility]  [phone]    [facility]  [phone]    [facility]  [phone]    [facility]  [phone]    [facility]  [phone]    [facility]  [phone] -------------- RESOURCE GUIDE:  Dental Problems  Patients with Medicaid: [facility] [phone]                                            [phone] [address] Phone:  [phone]                                                   Phone:  [phone]  If unable to pay or uninsured, contact:  [facility] or [facility]. to become qualified for the adult dental  clinic.  Chronic Pain Problems Contact [facility]  [phone] Patients need to be referred by their primary care doctor.  Insufficient Money for Medicine Contact [employer]:  call "211" or [facility] 516-830-4162.  Psychological Services [facility]  [phone] [facility]  [phone] [facility]   [phone] (emergency services [phone])  Substance Abuse Resources [facility]  5756751738 [facility] [phone] [facility] [phone] Floydene Flock [phone] Residential & Outpatient Substance Abuse Program  8012756011  Abuse/Neglect [facility] Child Abuse Hotline 202-399-5043 [facility] Child Abuse Hotline (646)816-5348 (After Hours)  Emergency Shelter [facility] [phone]  M[facility] [place] [facility] [phone] [facility] [phone]  [facility] [place]  [facility] [address] [place]                       [address]      [address]  [place]                                                [place]                            [place] Phone:  [phone]                                   Phone:  [phone]                 Phone:  [phone]  [facility] Phone:  [phone]  [place] Child Abuse Hotline (336) 342-1394 [phone] (After Hours)    

## 2011-11-13 NOTE — ED Provider Notes (Signed)
Medical screening examination/treatment/procedure(s) were performed by non-physician practitioner and as supervising physician I was immediately available for consultation/collaboration.  Yuko Coventry M Niaomi Cartaya, MD 11/13/11 2139 

## 2011-11-13 NOTE — ED Notes (Signed)
PA Dammen at bedside. 

## 2011-11-13 NOTE — ED Notes (Signed)
Patient transported to CT 

## 2011-11-13 NOTE — ED Notes (Signed)
Pt resting quietly on L side. No complaints at this time. Awaiting MRI.

## 2011-11-13 NOTE — ED Notes (Signed)
Received report from Alinda Money, [place]. Assumed care at this time.

## 2011-11-13 NOTE — ED Provider Notes (Signed)
Medical screening examination/treatment/procedure(s) were performed by non-physician practitioner and as supervising physician I was immediately available for consultation/collaboration.  Salbador Fiveash M Britton Bera, MD 11/13/11 2139 

## 2011-11-13 NOTE — ED Notes (Signed)
Pt in MRI.

## 2011-11-13 NOTE — ED Provider Notes (Signed)
History     CSN: [id number]  Arrival date & time 11/12/11  2343   First MD Initiated Contact with Patient 11/13/11 0228      Chief Complaint  Patient presents with  . Tinnitus  . Dizziness   HPI  History provided by the patient. Patient is a 51 year old male with history of hypertension, hypercholesterolemia and CAD who presents with complaints of acute onset right hearing loss and slight dizziness. Symptoms began earlier in the day and have been persistent. Patient denies any injury or trauma to the ear. Patient denies any ear pain. Patient denies using any objects to clean the ear. Symptoms have been associated with a slight "whooshing" sound at times. Patient denies any nausea or vomiting. Patient denies any ataxia or difficulty with coordination. Patient denies any facial droop, weakness or numbness in extremities, vision change or speech difficulty. Patient denies having any similar symptoms previously.    Past Medical History  Diagnosis Date  . CAD (coronary artery disease) 10/12/2011    Mild to mod dz on cath 2011;  Dr Verdis Prime    History reviewed. No pertinent past surgical history.  History reviewed. No pertinent family history.  History  Substance Use Topics  . Smoking status: Never Smoker   . Smokeless tobacco: Not on file  . Alcohol Use: No      Review of Systems  Constitutional: Negative for fever and chills.  HENT: Positive for hearing loss. Negative for neck pain and ear discharge.   Respiratory: Negative for shortness of breath.   Cardiovascular: Negative for chest pain.  Gastrointestinal: Negative for nausea and vomiting.  Neurological: Positive for dizziness. Negative for light-headedness and headaches.    Allergies  Lisinopril  Home Medications   Current Outpatient Rx  Name Route Sig Dispense Refill  . ASPIRIN EC 325 MG PO TBEC  TAKE 1 TABLET EVERY DAY 30 tablet 5  . BENICAR 40 MG PO TABS  TAKE 1 TABLET BY MOUTH EVERY DAY 30 tablet 4  .  HYDROCHLOROTHIAZIDE 12.5 MG PO CAPS  TAKE 1 CAPSULE EVERY DAY 30 capsule 4  . LABETALOL HCL 100 MG PO TABS Oral Take 1 tablet (100 mg total) by mouth 2 (two) times daily. 60 tablet 11  . SIMVASTATIN 40 MG PO TABS  TAKE 1 TABLET BY MOUTH EVERY DAY 30 tablet 5    BP 149/87  Pulse 60  Temp 97.7 F (36.5 C) (Oral)  Resp 20  SpO2 96%  Physical Exam  Nursing note and vitals reviewed. Constitutional: He is oriented to person, place, and time. He appears well-developed and well-nourished.  HENT:  Head: Normocephalic and atraumatic.  Right Ear: Tympanic membrane and external ear normal.  Left Ear: Hearing, tympanic membrane and ear canal normal.       Left lateralization on Weber test. Normal Rinn test on left side. No bone conductive sensation on the right with Rinn.  Eyes: Conjunctivae and EOM are normal. Pupils are equal, round, and reactive to light.  Neck: Normal range of motion. Neck supple.  Cardiovascular: Normal rate and regular rhythm.   Pulmonary/Chest: Effort normal and breath sounds normal.  Neurological: He is alert and oriented to person, place, and time. He has normal strength. No cranial nerve deficit or sensory deficit. Gait normal.       Normal finger to nose  Skin: Skin is warm.  Psychiatric: He has a normal mood and affect. His behavior is normal.    ED Course  Procedures   Ct  Head Wo Contrast  11/13/2011  *RADIOLOGY REPORT*  Clinical Data: Dizziness; right-sided tinnitus.  CT HEAD WITHOUT CONTRAST  Technique:  Contiguous axial images were obtained from the base of the skull through the vertex without contrast.  Comparison: CT of the head performed 04/05/2010, and limited MRI of the brain performed 02/20/2006  Findings: There is no evidence of acute infarction, mass lesion, or intra- or extra-axial hemorrhage on CT.  Diffuse subcortical white matter change is noted, more prominent than on the prior study.  Would correlate clinically for symptoms of demyelinating  disease such as multiple sclerosis, and consider MRI for further evaluation.  The posterior fossa, including the cerebellum, brainstem and fourth ventricle, is within normal limits.  The third and lateral ventricles, and basal ganglia are unremarkable in appearance.  The cerebral hemispheres are symmetric in appearance, with normal gray- white differentiation.  No mass effect or midline shift is seen.  There is no evidence of fracture; visualized osseous structures are unremarkable in appearance.  The orbits are within normal limits. The paranasal sinuses and mastoid air cells are well-aerated.  No significant soft tissue abnormalities are seen.  IMPRESSION: Diffuse subcortical white matter change, worsened from 2011.  Would correlate clinically for symptoms of demyelinating disease such as multiple sclerosis, and consider MRI for further evaluation.  Original Report Authenticated By: Tonia Ghent, M.D.     1. Hearing loss in right ear       MDM  Patient seen and evaluated. Patient no acute distress the   Pt discussed with Attending Physician.  Will order MRI for morning for further evaluation.   Pt discussed with Josh Geiple PA-C.  He will follow MRI results.     Angus Seller, [place] 11/13/11 [phone]

## 2011-11-13 NOTE — ED Notes (Signed)
ZOX:[id number]<AV> Expected date:<BR> Expected time:<BR> Means of arrival:<BR> Comments:<BR> Hold for triage 1

## 2011-12-23 ENCOUNTER — Other Ambulatory Visit (INDEPENDENT_AMBULATORY_CARE_PROVIDER_SITE_OTHER): Payer: 59

## 2011-12-23 ENCOUNTER — Ambulatory Visit (INDEPENDENT_AMBULATORY_CARE_PROVIDER_SITE_OTHER): Payer: 59 | Admitting: Internal Medicine

## 2011-12-23 ENCOUNTER — Encounter: Payer: Self-pay | Admitting: Internal Medicine

## 2011-12-23 VITALS — BP 140/90 | HR 58 | Temp 98.5°F | Resp 16 | Wt 204.8 lb

## 2011-12-23 DIAGNOSIS — I2510 Atherosclerotic heart disease of native coronary artery without angina pectoris: Secondary | ICD-10-CM

## 2011-12-23 DIAGNOSIS — K645 Perianal venous thrombosis: Secondary | ICD-10-CM

## 2011-12-23 DIAGNOSIS — E785 Hyperlipidemia, unspecified: Secondary | ICD-10-CM

## 2011-12-23 DIAGNOSIS — I10 Essential (primary) hypertension: Secondary | ICD-10-CM

## 2011-12-23 DIAGNOSIS — Z23 Encounter for immunization: Secondary | ICD-10-CM

## 2011-12-23 LAB — COMPREHENSIVE METABOLIC PANEL
ALT: 13 U/L (ref 0–53)
AST: 20 U/L (ref 0–37)
Creatinine, Ser: 1.2 mg/dL (ref 0.4–1.5)
Sodium: 139 mEq/L (ref 135–145)
Total Bilirubin: 0.8 mg/dL (ref 0.3–1.2)
Total Protein: 7.3 g/dL (ref 6.0–8.3)

## 2011-12-23 LAB — LIPID PANEL
HDL: 43 mg/dL (ref >39.00)
LDL Cholesterol: 117 mg/dL — ABNORMAL HIGH (ref 0–99)
Total CHOL/HDL Ratio: 4
Triglycerides: 75 mg/dL (ref 0.0–149.0)

## 2011-12-23 LAB — URINALYSIS, ROUTINE W REFLEX MICROSCOPIC
Hgb urine dipstick: NEGATIVE
Ketones, ur: NEGATIVE
Leukocytes, UA: NEGATIVE
Specific Gravity, Urine: >=1.03 (ref 1.000–1.030)
Urine Glucose: NEGATIVE
Urobilinogen, UA: 0.2 (ref 0.0–1.0)

## 2011-12-23 MED ORDER — OLMESARTAN MEDOXOMIL 40 MG PO TABS: ORAL_TABLET | ORAL | Status: DC

## 2011-12-23 MED ORDER — HYDROCHLOROTHIAZIDE 12.5 MG PO CAPS: ORAL_CAPSULE | ORAL | Status: DC

## 2011-12-23 MED ORDER — SIMVASTATIN 40 MG PO TABS: ORAL_TABLET | ORAL | Status: DC

## 2011-12-23 MED ORDER — LABETALOL HCL 100 MG PO TABS: 100.0000 mg | ORAL_TABLET | Freq: Two times a day (BID) | ORAL | Status: DC

## 2011-12-23 NOTE — Patient Instructions (Signed)

## 2011-12-23 NOTE — Progress Notes (Signed)
Subjective:    Patient ID: [name], male    DOB: 02-05-1961, 51 y.o.   MRN: [id number]  Hypertension This is a chronic problem. The current episode started more than 1 year ago. The problem is unchanged. The problem is controlled. Pertinent negatives include no anxiety, blurred vision, chest pain, headaches, malaise/fatigue, neck pain, orthopnea, palpitations, peripheral edema, PND, shortness of breath or sweats. Past treatments include angiotensin blockers, beta blockers and diuretics. The current treatment provides moderate improvement. Compliance problems include exercise and diet.       Review of Systems  Constitutional: Negative for fever, chills, malaise/fatigue, diaphoresis, activity change, appetite change, fatigue and unexpected weight change.  HENT: Negative.  Negative for neck pain.   Eyes: Negative.  Negative for blurred vision.  Respiratory: Negative for cough, chest tightness, shortness of breath, wheezing and stridor.   Cardiovascular: Negative for chest pain, palpitations, orthopnea, leg swelling and PND.  Gastrointestinal: Negative for nausea, vomiting, abdominal pain, diarrhea, constipation and blood in stool.  Genitourinary: Negative.   Musculoskeletal: Negative for myalgias, back pain, joint swelling, arthralgias and gait problem.  Skin: Negative for color change, pallor, rash and wound.  Neurological: Negative.  Negative for dizziness and headaches.  Hematological: Negative for adenopathy. Does not bruise/bleed easily.  Psychiatric/Behavioral: Negative.        Objective:   Physical Exam  Vitals reviewed. Constitutional: He is oriented to person, place, and time. He appears well-developed and well-nourished. No distress.  HENT:  Head: Normocephalic and atraumatic.  Mouth/Throat: Oropharynx is clear and moist. No oropharyngeal exudate.  Eyes: Conjunctivae are normal. Right eye exhibits no discharge. Left eye exhibits no discharge. No scleral icterus.    Neck: Normal range of motion. Neck supple. No JVD present. No tracheal deviation present. No thyromegaly present.  Cardiovascular: Normal rate, regular rhythm, normal heart sounds and intact distal pulses.  Exam reveals no gallop and no friction rub.   No murmur heard. Pulmonary/Chest: Effort normal and breath sounds normal. No stridor. No respiratory distress. He has no wheezes. He has no rales. He exhibits no tenderness.  Abdominal: Soft. Bowel sounds are normal. He exhibits no distension and no mass. There is no tenderness. There is no rebound and no guarding.  Musculoskeletal: Normal range of motion. He exhibits no edema and no tenderness.  Lymphadenopathy:    He has no cervical adenopathy.  Neurological: He is oriented to person, place, and time.  Skin: Skin is warm and dry. No rash noted. He is not diaphoretic. No erythema. No pallor.  Psychiatric: He has a normal mood and affect. His behavior is normal. Judgment and thought content normal.      Lab Results  Component Value Date   WBC 5.4 11/13/2011   HGB 12.8* 11/13/2011   HCT 37.9* 11/13/2011   PLT 242 11/13/2011   GLUCOSE 95 11/13/2011   CHOL 178 03/05/2011   TRIG 75 03/05/2011   HDL 40 03/05/2011   LDLCALC 123* 03/05/2011   ALT 13 03/04/2011   AST 22 03/04/2011   NA 139 11/13/2011   K 4.0 11/13/2011   CL 105 11/13/2011   CREATININE 1.22 11/13/2011   BUN 14 11/13/2011   CO2 25 11/13/2011   TSH 1.894 03/05/2011   PSA 1.34 10/12/2011   INR 1.01 04/05/2010   HGBA1C  Value: 5.1 (NOTE)  According to the ADA Clinical Practice Recommendations for 2011, when HbA1c is used as a screening test:   >=6.5%   Diagnostic of Diabetes Mellitus           (if abnormal result  is confirmed)  5.7-6.4%   Increased risk of developing Diabetes Mellitus  References:Diagnosis and Classification of Diabetes Mellitus,Diabetes Care,2011,34(Suppl 1):S62-S69 and Standards of Medical Care in          Diabetes - 2011,Diabetes Care,2011,34  (Suppl 1):S11-S61. 09/17/2009      Assessment & Plan:

## 2011-12-26 ENCOUNTER — Encounter: Payer: Self-pay | Admitting: Internal Medicine

## 2011-12-26 DIAGNOSIS — Z23 Encounter for immunization: Secondary | ICD-10-CM | POA: Insufficient documentation

## 2011-12-26 NOTE — Assessment & Plan Note (Signed)
He needs to see GI for a screening colonoscopy

## 2011-12-26 NOTE — Assessment & Plan Note (Signed)
He is doing well on Zocor, For an FLP today

## 2011-12-26 NOTE — Assessment & Plan Note (Signed)
He needs an annual f/up with his cardiologist

## 2011-12-26 NOTE — Assessment & Plan Note (Signed)
His BP is well controlled, I will check his lytes and renal function 

## 2012-01-05 ENCOUNTER — Other Ambulatory Visit: Payer: Self-pay | Admitting: Internal Medicine

## 2012-01-19 ENCOUNTER — Institutional Professional Consult (permissible substitution): Payer: 59 | Admitting: Cardiovascular Disease

## 2012-07-17 ENCOUNTER — Observation Stay (HOSPITAL_COMMUNITY)
Admission: EM | Admit: 2012-07-17 | Discharge: 2012-07-18 | Disposition: A | Discharge status: Home / Self Care | Payer: 59 | Attending: Internal Medicine | Admitting: Internal Medicine

## 2012-07-17 ENCOUNTER — Emergency Department (HOSPITAL_COMMUNITY): Payer: 59

## 2012-07-17 ENCOUNTER — Encounter (HOSPITAL_COMMUNITY): Payer: Self-pay | Admitting: Emergency Medicine

## 2012-07-17 DIAGNOSIS — I160 Hypertensive urgency: Secondary | ICD-10-CM

## 2012-07-17 DIAGNOSIS — I10 Essential (primary) hypertension: Secondary | ICD-10-CM

## 2012-07-17 DIAGNOSIS — E785 Hyperlipidemia, unspecified: Secondary | ICD-10-CM

## 2012-07-17 DIAGNOSIS — Z23 Encounter for immunization: Secondary | ICD-10-CM

## 2012-07-17 DIAGNOSIS — R0789 Other chest pain: Principal | ICD-10-CM

## 2012-07-17 DIAGNOSIS — K645 Perianal venous thrombosis: Secondary | ICD-10-CM

## 2012-07-17 DIAGNOSIS — E876 Hypokalemia: Secondary | ICD-10-CM

## 2012-07-17 DIAGNOSIS — R079 Chest pain, unspecified: Secondary | ICD-10-CM

## 2012-07-17 DIAGNOSIS — F528 Other sexual dysfunction not due to a substance or known physiological condition: Secondary | ICD-10-CM

## 2012-07-17 DIAGNOSIS — I2510 Atherosclerotic heart disease of native coronary artery without angina pectoris: Secondary | ICD-10-CM

## 2012-07-17 DIAGNOSIS — I422 Other hypertrophic cardiomyopathy: Secondary | ICD-10-CM | POA: Insufficient documentation

## 2012-07-17 DIAGNOSIS — F17200 Nicotine dependence, unspecified, uncomplicated: Secondary | ICD-10-CM

## 2012-07-17 HISTORY — DX: Unspecified hearing loss, unspecified ear: H91.90

## 2012-07-17 LAB — RAPID URINE DRUG SCREEN, HOSP PERFORMED
Barbiturates: NOT DETECTED
Benzodiazepines: NOT DETECTED

## 2012-07-17 LAB — BASIC METABOLIC PANEL
BUN: 13 mg/dL (ref 6–23)
Calcium: 9.1 mg/dL (ref 8.4–10.5)
Chloride: 105 mEq/L (ref 96–112)
Creatinine, Ser: 1.17 mg/dL (ref 0.50–1.35)
GFR calc Af Amer: 82 mL/min — ABNORMAL LOW (ref >90)

## 2012-07-17 LAB — TROPONIN I
Troponin I: <0.3 ng/mL (ref <0.30)
Troponin I: <0.3 ng/mL (ref <0.30)
Troponin I: <0.3 ng/mL (ref <0.30)

## 2012-07-17 LAB — CBC WITH DIFFERENTIAL/PLATELET
Basophils Relative: 1 % (ref 0–1)
Eosinophils Absolute: 0.2 10*3/uL (ref 0.0–0.7)
Eosinophils Relative: 4 % (ref 0–5)
HCT: 36.6 % — ABNORMAL LOW (ref 39.0–52.0)
Hemoglobin: 12.6 g/dL — ABNORMAL LOW (ref 13.0–17.0)
MCH: 29.8 pg (ref 26.0–34.0)
MCHC: 34.4 g/dL (ref 30.0–36.0)
MCV: 86.5 fL (ref 78.0–100.0)
Monocytes Absolute: 0.4 10*3/uL (ref 0.1–1.0)
Monocytes Relative: 7 % (ref 3–12)
Neutro Abs: 2.5 10*3/uL (ref 1.7–7.7)
RDW: 12.2 % (ref 11.5–15.5)

## 2012-07-17 LAB — CBC
HCT: 37.8 % — ABNORMAL LOW (ref 39.0–52.0)
MCV: 87.3 fL (ref 78.0–100.0)
RBC: 4.33 MIL/uL (ref 4.22–5.81)
WBC: 5 10*3/uL (ref 4.0–10.5)

## 2012-07-17 LAB — CREATININE, SERUM: GFR calc Af Amer: 78 mL/min — ABNORMAL LOW (ref >90)

## 2012-07-17 MED ORDER — POLYETHYLENE GLYCOL 3350 17 G PO PACK: 17.0000 g | PACK | Freq: Every day | ORAL | Status: DC | PRN

## 2012-07-17 MED ORDER — SODIUM CHLORIDE 0.9 % IV SOLN: 250.0000 mL | INTRAVENOUS | Status: DC | PRN

## 2012-07-17 MED ORDER — POTASSIUM CHLORIDE CRYS ER 20 MEQ PO TBCR
40.0000 meq | EXTENDED_RELEASE_TABLET | Freq: Two times a day (BID) | ORAL | Status: AC
  Administered 2012-07-17 (×2): 40 meq via ORAL
  Filled 2012-07-17 (×2): qty 2

## 2012-07-17 MED ORDER — AMLODIPINE BESYLATE 10 MG PO TABS
10.0000 mg | ORAL_TABLET | Freq: Every day | ORAL | Status: DC
  Administered 2012-07-17 – 2012-07-18 (×2): 10 mg via ORAL
  Filled 2012-07-17 (×2): qty 1

## 2012-07-17 MED ORDER — SIMVASTATIN 40 MG PO TABS
40.0000 mg | ORAL_TABLET | Freq: Every day | ORAL | Status: DC
  Filled 2012-07-17: qty 1

## 2012-07-17 MED ORDER — POTASSIUM CHLORIDE CRYS ER 20 MEQ PO TBCR
20.0000 meq | EXTENDED_RELEASE_TABLET | Freq: Every day | ORAL | Status: DC
  Administered 2012-07-18: 20 meq via ORAL
  Filled 2012-07-17: qty 1

## 2012-07-17 MED ORDER — ASPIRIN 325 MG PO TABS
325.0000 mg | ORAL_TABLET | Freq: Every day | ORAL | Status: DC
  Administered 2012-07-17 – 2012-07-18 (×2): 325 mg via ORAL
  Filled 2012-07-17 (×2): qty 1

## 2012-07-17 MED ORDER — HYDROCODONE-ACETAMINOPHEN 5-325 MG PO TABS
1.0000 | ORAL_TABLET | ORAL | Status: DC | PRN
  Administered 2012-07-17 – 2012-07-18 (×2): 1 via ORAL
  Filled 2012-07-17: qty 1
  Filled 2012-07-17: qty 2

## 2012-07-17 MED ORDER — ACETAMINOPHEN 650 MG RE SUPP: 650.0000 mg | Freq: Four times a day (QID) | RECTAL | Status: DC | PRN

## 2012-07-17 MED ORDER — IRBESARTAN 300 MG PO TABS
300.0000 mg | ORAL_TABLET | Freq: Every day | ORAL | Status: DC
  Administered 2012-07-17 – 2012-07-18 (×2): 300 mg via ORAL
  Filled 2012-07-17 (×2): qty 1

## 2012-07-17 MED ORDER — SODIUM CHLORIDE 0.9 % IJ SOLN: 3.0000 mL | Freq: Two times a day (BID) | INTRAMUSCULAR | Status: DC

## 2012-07-17 MED ORDER — MAGNESIUM OXIDE 400 (241.3 MG) MG PO TABS
400.0000 mg | ORAL_TABLET | Freq: Two times a day (BID) | ORAL | Status: AC
  Administered 2012-07-17 (×2): 400 mg via ORAL
  Filled 2012-07-17 (×2): qty 1

## 2012-07-17 MED ORDER — SODIUM CHLORIDE 0.9 % IJ SOLN: 3.0000 mL | INTRAMUSCULAR | Status: DC | PRN

## 2012-07-17 MED ORDER — ONDANSETRON HCL 4 MG/2ML IJ SOLN: 4.0000 mg | Freq: Four times a day (QID) | INTRAMUSCULAR | Status: DC | PRN

## 2012-07-17 MED ORDER — ATORVASTATIN CALCIUM 20 MG PO TABS
20.0000 mg | ORAL_TABLET | Freq: Every day | ORAL | Status: DC
  Administered 2012-07-17: 20 mg via ORAL
  Filled 2012-07-17 (×2): qty 1

## 2012-07-17 MED ORDER — LABETALOL HCL 200 MG PO TABS
200.0000 mg | ORAL_TABLET | Freq: Two times a day (BID) | ORAL | Status: DC
  Administered 2012-07-17 – 2012-07-18 (×2): 200 mg via ORAL
  Filled 2012-07-17 (×4): qty 1

## 2012-07-17 MED ORDER — MORPHINE SULFATE 4 MG/ML IJ SOLN
4.0000 mg | Freq: Once | INTRAMUSCULAR | Status: AC
  Administered 2012-07-17: 4 mg via INTRAVENOUS
  Filled 2012-07-17: qty 1

## 2012-07-17 MED ORDER — ONDANSETRON HCL 4 MG PO TABS: 4.0000 mg | ORAL_TABLET | Freq: Four times a day (QID) | ORAL | Status: DC | PRN

## 2012-07-17 MED ORDER — HEPARIN SODIUM (PORCINE) 5000 UNIT/ML IJ SOLN
5000.0000 [IU] | Freq: Three times a day (TID) | INTRAMUSCULAR | Status: DC
  Administered 2012-07-17 – 2012-07-18 (×4): 5000 [IU] via SUBCUTANEOUS
  Filled 2012-07-17 (×7): qty 1

## 2012-07-17 MED ORDER — SODIUM CHLORIDE 0.9 % IJ SOLN
3.0000 mL | Freq: Two times a day (BID) | INTRAMUSCULAR | Status: DC
  Administered 2012-07-17 (×2): 3 mL via INTRAVENOUS

## 2012-07-17 MED ORDER — NITROGLYCERIN 2 % TD OINT
1.0000 [in_us] | TOPICAL_OINTMENT | Freq: Four times a day (QID) | TRANSDERMAL | Status: DC
  Administered 2012-07-17 – 2012-07-18 (×5): 1 [in_us] via TOPICAL
  Filled 2012-07-17: qty 1
  Filled 2012-07-17: qty 30

## 2012-07-17 MED ORDER — ACETAMINOPHEN 325 MG PO TABS
650.0000 mg | ORAL_TABLET | Freq: Four times a day (QID) | ORAL | Status: DC | PRN
  Administered 2012-07-17: 650 mg via ORAL
  Filled 2012-07-17: qty 2

## 2012-07-17 MED ORDER — HYDROCHLOROTHIAZIDE 25 MG PO TABS
25.0000 mg | ORAL_TABLET | Freq: Every day | ORAL | Status: DC
  Administered 2012-07-17 – 2012-07-18 (×2): 25 mg via ORAL
  Filled 2012-07-17 (×2): qty 1

## 2012-07-17 NOTE — ED Provider Notes (Signed)
History     CSN: [id number]  Arrival date & time 07/17/12  0106   First MD Initiated Contact with Patient 07/17/12 0133      Chief Complaint  Patient presents with  . Hypertension  . Chest Pain    (Consider location/radiation/quality/duration/timing/severity/associated sxs/prior treatment) HPI 52 yo male presents to the ER via EMS from home with complaint of chest pain.  Pt reports onset of chest pain this evening around 11 pm while watching TV.  Pain is in left upper to mid chest, a severe pressure sensation.  He denies any sob, nausea, diaphoresis.  Pt has taken full strength aspirin, and has received 2 ntg from EMS with improved his chest pain only slightly.  Pt has h/o htn, reports he did not take his meds today due to being busy this morning.  He has had a headache all week, has been taking goody powders twice a day.  He has h/o headaches, usually when he is stressed.  HA is worse all day today, and much worse since having the ntg.  He reports dizziness when sitting up.  He reports pacemaker in his grandmother, o/w no heart history in family.  He does not smoke.  EMR shows mild to mod CAD on cath in 2011.   Past Medical History  Diagnosis Date  . CAD (coronary artery disease) 10/12/2011    Mild to mod dz on cath 2011;  Dr Verdis Prime  . Hypertension   . Hyperlipidemia   . HOH (hard of hearing) 07/2011    rgiht ear    No past surgical history on file.  No family history on file.  History  Substance Use Topics  . Smoking status: Never Smoker   . Smokeless tobacco: Never Used  . Alcohol Use: No      Review of Systems  All other systems reviewed and are negative.    Allergies  Lisinopril  Home Medications   Current Outpatient Rx  Name  Route  Sig  Dispense  Refill  . amLODipine (NORVASC) 10 MG tablet   Oral   Take 10 mg by mouth daily.         Marland Kitchen aspirin 325 MG tablet   Oral   Take 325 mg by mouth daily.         Marland Kitchen labetalol (NORMODYNE) 200 MG tablet  Oral   Take 200 mg by mouth 2 (two) times daily.         Marland Kitchen olmesartan (BENICAR) 40 MG tablet      TAKE 1 TABLET BY MOUTH EVERY DAY   90 tablet   3   . simvastatin (ZOCOR) 40 MG tablet      TAKE 1 TABLET BY MOUTH EVERY DAY   90 tablet   3     BP 162/101  Pulse 59  Temp(Src) 97.7 F (36.5 C) (Oral)  Resp 22  SpO2 99%  Physical Exam  Nursing note and vitals reviewed. Constitutional: He is oriented to person, place, and time. He appears well-developed and well-nourished. He appears distressed (uncomfortable appearing).  HENT:  Head: Normocephalic and atraumatic.  Nose: Nose normal.  Mouth/Throat: Oropharynx is clear and moist.  Eyes: Conjunctivae and EOM are normal. Pupils are equal, round, and reactive to light.  Neck: Normal range of motion. Neck supple. No JVD present. No tracheal deviation present. No thyromegaly present.  Cardiovascular: Normal rate, regular rhythm, normal heart sounds and intact distal pulses.  Exam reveals no gallop and no friction rub.  No murmur heard. HTN noted  Pulmonary/Chest: Effort normal and breath sounds normal. No stridor. No respiratory distress. He has no wheezes. He has no rales. He exhibits no tenderness.  Abdominal: Soft. Bowel sounds are normal. He exhibits no distension and no mass. There is no tenderness. There is no rebound and no guarding.  Musculoskeletal: Normal range of motion. He exhibits no edema and no tenderness.  Lymphadenopathy:    He has no cervical adenopathy.  Neurological: He is alert and oriented to person, place, and time. He has normal reflexes. No cranial nerve deficit. He exhibits normal muscle tone. Coordination normal.  Skin: Skin is warm and dry. No rash noted. No erythema. No pallor.  Psychiatric: He has a normal mood and affect. His behavior is normal. Judgment and thought content normal.    ED Course  Procedures (including critical care time)  Labs Reviewed  CBC WITH DIFFERENTIAL - Abnormal; Notable  for the following:    Hemoglobin 12.6 (*)    HCT 36.6 (*)    All other components within normal limits  BASIC METABOLIC PANEL - Abnormal; Notable for the following:    Potassium 3.2 (*)    Glucose, Bld 101 (*)    GFR calc non Af Amer 71 (*)    GFR calc Af Amer 82 (*)    All other components within normal limits  TROPONIN I   No results found.   Date: 07/17/2012  Rate: 58  Rhythm: normal sinus rhythm  QRS Axis: normal  Intervals: normal  ST/T Wave abnormalities: t wave inversions inferior lateral  Conduction Disutrbances:none  Narrative Interpretation: LVH  Old EKG Reviewed: unchanged    1. Chest pain   2. Hypertension       MDM  52 yo male with chest pain, HA, HTN.  EKG with twave inversions, but seen on prior ekg.  Will try morphine for pain to see if this helps HTN.  Will check CT head given persistent headache.          Olivia Mackie, MD 07/17/12 [phone]

## 2012-07-17 NOTE — ED Notes (Signed)
Family at bedside. 

## 2012-07-17 NOTE — H&P (Signed)
[facility] History and Physical  [name] ZOX:[id number] DOB: [birth year] DOA: 07/17/2012  Referring physician: Dr. Norlene Campbell PCP: Sanda Linger, MD  Specialists: none  Chief Complaint: chest pain  HPI: [name] is a 52 y.o. male  With past medical history of hypertrophic cardiomyopathy that comes in for atypical chest pain this started the day prior to admission. He relates started when he was sitting up in bed. He relates he checked his blood pressure at this time and it was well over 200/100. He describes his pain in the left mid chest pressure-like denies any nausea, shortness of breath, vomiting, diaphoresis or flulike symptoms. He also relays she's been having headache for the past week has been taking Goody powder twice a day for this. He relates his pain is made is not made worse or better by anything. In the emergency room first set of cardiac enzymes were done which were negative and EKG is pending at time of this dictation.  Review of Systems: The patient denies anorexia, fever, weight loss,, vision loss, decreased hearing, hoarseness, chest pain, syncope, dyspnea on exertion, peripheral edema, balance deficits, hemoptysis, abdominal pain, melena, hematochezia, severe indigestion/heartburn, hematuria, incontinence, genital sores, muscle weakness, suspicious skin lesions, transient blindness, difficulty walking, depression, unusual weight change, abnormal bleeding, enlarged lymph nodes, angioedema, and breast masses.    Past Medical History  Diagnosis Date  . CAD (coronary artery disease) 10/12/2011    Mild to mod dz on cath 2011;  Dr Verdis Prime  . Hypertension   . Hyperlipidemia   . HOH (hard of hearing) 07/2011    rgiht ear   History reviewed. No pertinent past surgical history. Social History:  reports that he has been smoking Cigarettes.  He has been smoking about 0.50 packs per day. He has never used smokeless tobacco. He reports that he does not drink alcohol  or use illicit drugs. Lives at home with wife can perform all his ADLs  Allergies  Allergen Reactions  . Lisinopril Swelling    Family History  Problem Relation Age of Onset  . Hypertension Mother   . Heart failure Mother   . Hypertension Father     Prior to Admission medications   Medication Sig Start Date End Date Taking? Authorizing Provider  amLODipine (NORVASC) 10 MG tablet Take 10 mg by mouth daily.   Yes Historical Provider, MD  aspirin 325 MG tablet Take 325 mg by mouth daily.   Yes Historical Provider, MD  labetalol (NORMODYNE) 200 MG tablet Take 200 mg by mouth 2 (two) times daily.   Yes Historical Provider, MD  olmesartan (BENICAR) 40 MG tablet TAKE 1 TABLET BY MOUTH EVERY DAY 12/23/11  Yes Etta Grandchild, MD  simvastatin (ZOCOR) 40 MG tablet TAKE 1 TABLET BY MOUTH EVERY DAY 12/23/11  Yes Etta Grandchild, MD   Physical Exam: Filed Vitals:   07/17/12 0200 07/17/12 0310 07/17/12 0315 07/17/12 0330  BP: 158/99 177/98 153/96 160/93  Pulse: 53 58 60 61  Temp:  97.7 F (36.5 C)    TempSrc:  Oral    Resp: 17 18 16 17   SpO2: 100% 99% 97% 99%    BP 160/93  Pulse 61  Temp(Src) 97.7 F (36.5 C) (Oral)  Resp 17  SpO2 99%  General Appearance:    Alert, cooperative, no distress, appears stated age  Head:    Normocephalic, without obvious abnormality, atraumatic  Eyes:    PERRL, conjunctiva/corneas clear, EOM's intact, fundi    benign, both eyes  Ears:    Normal TM's and external ear canals, both ears  Nose:   Nares normal, septum midline, mucosa normal, no drainage    or sinus tenderness  Throat:   Lips, mucosa, and tongue normal; teeth and gums normal  Neck:   Supple, symmetrical, trachea midline, no adenopathy;       thyroid:  No enlargement/tenderness/nodules; no carotid   bruit or JVD  Back:     Symmetric, no curvature, ROM normal, no CVA tenderness  Lungs:     Clear to auscultation bilaterally, respirations unlabored  Chest wall:    No tenderness or deformity   Heart:    Regular rate and rhythm, S1 and S2 normal, no murmur, rub   or gallop  Abdomen:     Soft, non-tender, bowel sounds active all four quadrants,    no masses, no organomegaly  Genitalia:    Normal male without lesion, discharge or tenderness  Rectal:    Normal tone, normal prostate, no masses or tenderness;   guaiac negative stool  Extremities:   Extremities normal, atraumatic, no cyanosis or edema  Pulses:   2+ and symmetric all extremities  Skin:   Skin color, texture, turgor normal, no rashes or lesions  Lymph nodes:   Cervical, supraclavicular, and axillary nodes normal  Neurologic:   CNII-XII intact. Normal strength, sensation and reflexes      throughout    Labs on Admission:  Basic Metabolic Panel:  Recent Labs Lab 07/17/12 0145  NA 140  K 3.2*  CL 105  CO2 25  GLUCOSE 101*  BUN 13  CREATININE 1.17  CALCIUM 9.1   Liver Function Tests: No results found for this basename: AST, ALT, ALKPHOS, BILITOT, PROT, ALBUMIN,  in the last 168 hours No results found for this basename: LIPASE, AMYLASE,  in the last 168 hours No results found for this basename: AMMONIA,  in the last 168 hours CBC:  Recent Labs Lab 07/17/12 0145  WBC 5.0  NEUTROABS 2.5  HGB 12.6*  HCT 36.6*  MCV 86.5  PLT 220   Cardiac Enzymes:  Recent Labs Lab 07/17/12 0200  TROPONINI <0.30    BNP (last 3 results) No results found for this basename: PROBNP,  in the last 8760 hours CBG: No results found for this basename: GLUCAP,  in the last 168 hours  Radiological Exams on Admission: Ct Head Wo Contrast  07/17/2012  *RADIOLOGY REPORT*  Clinical Data: Headache, hypertension  CT HEAD WITHOUT CONTRAST  Technique:  Contiguous axial images were obtained from the base of the skull through the vertex without contrast.  Comparison: 11/13/2011 MRI  Findings: Subcortical white matter hypodensities are nonspecific, often seen with chronic microangiopathic change. There is no evidence for acute  hemorrhage, hydrocephalus, mass lesion, or abnormal extra-axial fluid collection.  No definite CT evidence for acute infarction.  The to the can not from the lung The visualized paranasal sinuses and mastoid air cells are predominately clear.  IMPRESSION: Nonspecific subcortical white matter hypodensities are similar to prior.  No CT evidence of acute intracranial abnormality.   Original Report Authenticated By: Jearld Lesch, M.D.    Dg Chest Port 1 View  07/17/2012  *RADIOLOGY REPORT*  Clinical Data: Chest pain, shortness of breath  PORTABLE CHEST - 1 VIEW  Comparison: 04/05/2010  Findings: Lungs are clear. No pleural effusion or pneumothorax. The cardiomediastinal contours are within normal limits. The visualized bones and soft tissues are without significant appreciable abnormality.  IMPRESSION: No radiographic evidence of acute  cardiopulmonary process.   Original Report Authenticated By: Jearld Lesch, M.D.     EKG: Independently reviewed. Pending at the time of this dictation  Assessment/Plan  Atypical chest pain: - Admit him to the telemetry unit, cycle his cardiac enzymes x3. Get a 2-D echo as we have no recent echo in system for him. We'll continue his nitroglycerin. His pains seems to be atypical, he relates nothing makes it better or worse. It had minimal he was sitting up in bed.  HYPERTENSION: - He relates at home it was greater than 200/120, now here in the emergency room has been ranging below 160/100. I will have him continue his current home medications. And reevaluate in the morning.   CAD (coronary artery disease): - Continue aspirin.    Hypokalemia: - Unclear etiology. It is quite concerning that his potassium is low he is not on a diuretic he is on an ARB. If there is another drug that needs to be added I would consider adding spironolactone. We'll go ahead and replete his potassium and replete his magnesium check a magnesium level and repeat a basic metabolic panel in  the morning.  Code Status: full Family Communication: wife Disposition Plan: observation   Time spent: [address] Rosine Beat [facility] Pager [phone]  If 7PM-7AM, please contact night-coverage [email or web address] Password [facility] 07/17/2012, 3:56 AM

## 2012-07-17 NOTE — Consult Note (Addendum)
Admit date: 07/17/2012 Referring Physician  Dr. Ardyth Harps Primary Physician  Dr. Sanda Linger Primary Cardiologist  Dr. Verdis Prime Reason for Consultation  Chest pain  HPI: This is a 52 y.o. male with a past medical history of hypertrophic cardiomyopathy who presented with chest pain that started the day prior to admission. He relates it started when he was sitting up in bed. He checked his blood pressure and it was well over 200/100. He describes his pain in the left mid chest pressure-like and denies any nausea,  vomiting, diaphoresis or flulike symptoms. He says that he has been having DOE recently.  He also has been having headaches for the past week and had been taking Goody powder twice a day for this. His pain is not made worse or better by anything. Troponin x 1 is normal. We are now asked to see her to evaluate for CAD.    PMH:   Past Medical History  Diagnosis Date  . CAD (coronary artery disease) 10/12/2011    Mild to mod dz on cath 2011;  Dr Verdis Prime  . Hypertension   . Hyperlipidemia   . HOH (hard of hearing) 07/2011    rgiht ear     PSH:  History reviewed. No pertinent past surgical history.  Allergies:  Lisinopril Prior to Admit Meds:   Prescriptions prior to admission  Medication Sig Dispense Refill  . amLODipine (NORVASC) 10 MG tablet Take 10 mg by mouth daily.      Marland Kitchen aspirin 325 MG tablet Take 325 mg by mouth daily.      Marland Kitchen labetalol (NORMODYNE) 200 MG tablet Take 200 mg by mouth 2 (two) times daily.      Marland Kitchen olmesartan (BENICAR) 40 MG tablet TAKE 1 TABLET BY MOUTH EVERY DAY  90 tablet  3  . simvastatin (ZOCOR) 40 MG tablet TAKE 1 TABLET BY MOUTH EVERY DAY  90 tablet  3   Fam HX:    Family History  Problem Relation Age of Onset  . Hypertension Mother   . Heart failure Mother   . Hypertension Father    Social HX:    History   Social History  . Marital Status: Married    Spouse Name: N/A    Number of Children: N/A  . Years of Education: N/A   Occupational  History  . Not on file.   Social History Main Topics  . Smoking status: Current Every Day Smoker -- 0.50 packs/day    Types: Cigarettes  . Smokeless tobacco: Never Used  . Alcohol Use: No  . Drug Use: No  . Sexually Active: Yes   Other Topics Concern  . Not on file   Social History Narrative  . No narrative on file     ROS:  All 11 ROS were addressed and are negative except what is stated in the HPI  Physical Exam: Blood pressure 148/94, pulse 62, temperature 97.9 F (36.6 C), temperature source Oral, resp. rate 18, weight 91.944 kg (202 lb 11.2 oz), SpO2 99.00%.    General: Well developed, well nourished, in no acute distress Head: Eyes PERRLA, No xanthomas.   Normal cephalic and atramatic  Lungs:   Clear bilaterally to auscultation and percussion. Heart:   HRRR S1 S2 Pulses are 2+ & equal.            No carotid bruit. No JVD.  No abdominal bruits. No femoral bruits. Abdomen: Bowel sounds are positive, abdomen soft and non-tender without masses or  Hernia's noted. Msk:  Back normal, normal gait. Normal strength and tone for age. Extremities:   No clubbing, cyanosis or edema.  DP +1 Neuro: Alert and oriented X 3. Psych:  Good affect, responds appropriately    Labs:   Lab Results  Component Value Date   WBC 5.0 07/17/2012   HGB 12.7* 07/17/2012   HCT 37.8* 07/17/2012   MCV 87.3 07/17/2012   PLT 226 07/17/2012    Recent Labs Lab 07/17/12 0145 07/17/12 0659  NA 140  --   K 3.2*  --   CL 105  --   CO2 25  --   BUN 13  --   CREATININE 1.17 1.23  CALCIUM 9.1  --   GLUCOSE 101*  --    No results found for this basename: PTT   Lab Results  Component Value Date   INR 1.01 04/05/2010   INR 1.04 09/17/2009   Lab Results  Component Value Date   CKTOTAL 237* 03/05/2011   CKMB 2.6 03/05/2011   TROPONINI <0.30 07/17/2012     Lab Results  Component Value Date   CHOL 175 12/23/2011   CHOL 178 03/05/2011   CHOL 166 12/15/2009   Lab Results  Component  Value Date   HDL 43.00 12/23/2011   HDL 40 03/05/2011   HDL 40.30 12/15/2009   Lab Results  Component Value Date   LDLCALC 117* 12/23/2011   LDLCALC 123* 03/05/2011   LDLCALC 116* 12/15/2009   Lab Results  Component Value Date   TRIG 75.0 12/23/2011   TRIG 75 03/05/2011   TRIG 48.0 12/15/2009   Lab Results  Component Value Date   CHOLHDL 4 12/23/2011   CHOLHDL 4.5 03/05/2011   CHOLHDL 4 12/15/2009   No results found for this basename: LDLDIRECT      Radiology:  Ct Head Wo Contrast  07/17/2012  *RADIOLOGY REPORT*  Clinical Data: Headache, hypertension  CT HEAD WITHOUT CONTRAST  Technique:  Contiguous axial images were obtained from the base of the skull through the vertex without contrast.  Comparison: 11/13/2011 MRI  Findings: Subcortical white matter hypodensities are nonspecific, often seen with chronic microangiopathic change. There is no evidence for acute hemorrhage, hydrocephalus, mass lesion, or abnormal extra-axial fluid collection.  No definite CT evidence for acute infarction.  The to the can not from the lung The visualized paranasal sinuses and mastoid air cells are predominately clear.  IMPRESSION: Nonspecific subcortical white matter hypodensities are similar to prior.  No CT evidence of acute intracranial abnormality.   Original Report Authenticated By: Jearld Lesch, M.D.    Dg Chest Port 1 View  07/17/2012  *RADIOLOGY REPORT*  Clinical Data: Chest pain, shortness of breath  PORTABLE CHEST - 1 VIEW  Comparison: 04/05/2010  Findings: Lungs are clear. No pleural effusion or pneumothorax. The cardiomediastinal contours are within normal limits. The visualized bones and soft tissues are without significant appreciable abnormality.  IMPRESSION: No radiographic evidence of acute cardiopulmonary process.   Original Report Authenticated By: Jearld Lesch, M.D.     EKG:  NSR with LVH and deep T wave inversions in the anterior precordial leads  ASSESSMENT:  1.  Chest pain most  likely secondary to supply demand mismatch from hypertensive urgency. 2.  Hypertrophic CM 3.  Hypertensive urgency -BP improved but needs aggressive BP control.  I did discuss dietary sodium restriction with him.  He does use added salt in his diet at home. 4.  Headache secondary to #3 5.  Hypokalemia  6.  Dyslipidemia 7.  CAD mild to mod disease cath 2011  PLAN:   1.  Continue to cycle cardiac enzymes - if rules out then Lexiscan CL in am 2.  2D echo to reassess HOCM 3.  Discussed low sodium diet 4.  Add HCTZ since he is African American and not on a diuretic.  5.   Continue ARB/amlodipine and Labetolol 6.  NPO after midnight 7.  Replete potassium today and then put on daily Kdur daily Quintella Reichert, MD  07/17/2012  11:45 AM

## 2012-07-17 NOTE — Progress Notes (Addendum)
Discussed with Dr. Katrinka Blazing.  He would like to hold off on nuclear stress test for now.  Patient needs aggressive BP control.  Will cancel nuclear stress test for now.  If he rules out for MI may consider nuclear stress test as an outpatient.  Dr. Katrinka Blazing will see in am.

## 2012-07-17 NOTE — Progress Notes (Signed)
Patient admitted earlier today for CP. Has a h/o CAD with cath in 4/13 with moderate CAD. So far 2 troponins negative. Have consulted cardiology. Plan is for myoview in am. Will continue to follow.  Peggye Pitt, MD [facility] Pager: [phone]

## 2012-07-17 NOTE — ED Notes (Signed)
Patient has been having hypertension all week, however the chest pain started today.  The pain radiates to the left arm.  The patient denies diaphoresis, SOB, no weakness, nausea or vomiting.  Patient has been dizzy for months and has an appointment with his doctor for this.  Patient did take 325mg  of Aspirin prior to EMS arrival.  EMS gave him two of Nitro and the chest came down from a 6/10 to 4/10.  Headache is 10/10.  Patient is compliant with his medications and does take them like he is supposed to.  Wife is at the bedside.

## 2012-07-18 DIAGNOSIS — I2510 Atherosclerotic heart disease of native coronary artery without angina pectoris: Secondary | ICD-10-CM

## 2012-07-18 DIAGNOSIS — R079 Chest pain, unspecified: Secondary | ICD-10-CM

## 2012-07-18 LAB — BASIC METABOLIC PANEL
BUN: 10 mg/dL (ref 6–23)
Creatinine, Ser: 1.24 mg/dL (ref 0.50–1.35)
GFR calc Af Amer: 77 mL/min — ABNORMAL LOW (ref >90)
GFR calc non Af Amer: 66 mL/min — ABNORMAL LOW (ref >90)
Glucose, Bld: 96 mg/dL (ref 70–99)
Potassium: 4 mEq/L (ref 3.5–5.1)

## 2012-07-18 MED ORDER — POTASSIUM CHLORIDE CRYS ER 20 MEQ PO TBCR: 20.0000 meq | EXTENDED_RELEASE_TABLET | Freq: Every day | ORAL | Status: DC

## 2012-07-18 MED ORDER — HYDROCHLOROTHIAZIDE 25 MG PO TABS: 25.0000 mg | ORAL_TABLET | Freq: Every day | ORAL | Status: DC

## 2012-07-18 NOTE — Discharge Summary (Signed)
Physician Discharge Summary  Patient ID: [name] MRN: [id number] DOB/AGE: [birth year] 52 y.o.  Admit date: 07/17/2012 Discharge date: 07/18/2012  Primary Care Physician:  Sanda Linger, MD   Discharge Diagnoses:    Principal Problem:   Atypical chest pain Active Problems:   HYPERTENSION   CAD (coronary artery disease)   Hypokalemia   Hypertensive urgency      Medication List    TAKE these medications       amLODipine 10 MG tablet  Commonly known as:  NORVASC  Take 10 mg by mouth daily.     aspirin 325 MG tablet  Take 325 mg by mouth daily.     hydrochlorothiazide 25 MG tablet  Commonly known as:  HYDRODIURIL  Take 1 tablet (25 mg total) by mouth daily.     labetalol 200 MG tablet  Commonly known as:  NORMODYNE  Take 200 mg by mouth 2 (two) times daily.     olmesartan 40 MG tablet  Commonly known as:  BENICAR  TAKE 1 TABLET BY MOUTH EVERY DAY     potassium chloride SA 20 MEQ tablet  Commonly known as:  K-DUR,KLOR-CON  Take 1 tablet (20 mEq total) by mouth daily.     simvastatin 40 MG tablet  Commonly known as:  ZOCOR  TAKE 1 TABLET BY MOUTH EVERY DAY         Disposition and Follow-up:  Will be discharged home today in stable and improved condition. Has follow up already scheduled with his cardiologist, Dr. Katrinka Blazing  Consults:  Cardiology, Dr. Anne Fu.   Significant Diagnostic Studies:  Ct Head Wo Contrast  07/17/2012  *RADIOLOGY REPORT*  Clinical Data: Headache, hypertension  CT HEAD WITHOUT CONTRAST  Technique:  Contiguous axial images were obtained from the base of the skull through the vertex without contrast.  Comparison: 11/13/2011 MRI  Findings: Subcortical white matter hypodensities are nonspecific, often seen with chronic microangiopathic change. There is no evidence for acute hemorrhage, hydrocephalus, mass lesion, or abnormal extra-axial fluid collection.  No definite CT evidence for acute infarction.  The to the can not from the lung The  visualized paranasal sinuses and mastoid air cells are predominately clear.  IMPRESSION: Nonspecific subcortical white matter hypodensities are similar to prior.  No CT evidence of acute intracranial abnormality.   Original Report Authenticated By: Jearld Lesch, M.D.    Dg Chest Port 1 View  07/17/2012  *RADIOLOGY REPORT*  Clinical Data: Chest pain, shortness of breath  PORTABLE CHEST - 1 VIEW  Comparison: 04/05/2010  Findings: Lungs are clear. No pleural effusion or pneumothorax. The cardiomediastinal contours are within normal limits. The visualized bones and soft tissues are without significant appreciable abnormality.  IMPRESSION: No radiographic evidence of acute cardiopulmonary process.   Original Report Authenticated By: Jearld Lesch, M.D.     Brief H and P: For complete details please refer to admission H and P, but in brief patient is a 52 y.o. Man with past medical history of hypertrophic cardiomyopathy that comes in for atypical chest pain this started the day prior to admission. He relates started when he was sitting up in bed. He relates he checked his blood pressure at this time and it was well over 200/100. He describes his pain in the left mid chest pressure-like denies any nausea, shortness of breath, vomiting, diaphoresis or flulike symptoms. He also relays he has been having headache for the past week has been taking Goody powder twice a day for this. He relates his  pain is made is not made worse or better by anything. In the emergency room first set of cardiac enzymes were done which were negative. We were asked to admit him for further evaluation and management.     Hospital Course:  Principal Problem:   Atypical chest pain Active Problems:   HYPERTENSION   CAD (coronary artery disease)   Hypokalemia   Hypertensive urgency   Atypical CP -Seen by cardiology. -They believe no further cardiac work up is necessary at this time and recommend optimizing BP  control.  HTN -BP improved to 140/88. -Will need to continue to address this in the OP setting. -He is on norvasc, labetalol and olmesartan. -HCTZ has been added this admission as well.  Time spent on Discharge: Greater than 30 minutes.  SignedChaya Jan [facility] Pager: [phone] 07/18/2012, 12:11 PM

## 2012-07-18 NOTE — Progress Notes (Signed)
*  PRELIMINARY RESULTS* Echocardiogram 2D Echocardiogram has been performed.  [name] 07/18/2012, 9:18 AM

## 2012-07-18 NOTE — Progress Notes (Signed)
Subjective:  Is comfortable this morning, no significant chest pain. No pleuritic component. He has no shortness of breath.  Objective:  Vital Signs in the last 24 hours: Temp:  [98.2 F (36.8 C)-98.3 F (36.8 C)] 98.2 F (36.8 C) (02/18 0401) Pulse Rate:  [54-62] 62 (02/18 0401) Resp:  [18] 18 (02/18 0401) BP: (130-147)/(86-91) 141/89 mmHg (02/18 0401) SpO2:  [100 %] 100 % (02/18 0401) Weight:  [89.54 kg (197 lb 6.4 oz)-91.9 kg (202 lb 9.6 oz)] 89.54 kg (197 lb 6.4 oz) (02/18 0531)  Intake/Output from previous day: 02/17 0701 - 02/18 0700 In: 723 [P.O.:720; I.V.:3] Out: -    Physical Exam: General: Well developed, well nourished, in no acute distress. Head:  Normocephalic and atraumatic. Lungs: Clear to auscultation and percussion. Heart: Normal S1 and S2.  No murmur, rubs or gallops.  Abdomen: soft, non-tender, positive bowel sounds. Extremities: No clubbing or cyanosis. No edema. Neurologic: Alert and oriented x 3.    Lab Results:  Recent Labs  07/17/12 0145 07/17/12 0659  WBC 5.0 5.0  HGB 12.6* 12.7*  PLT 220 226    Recent Labs  07/17/12 0145 07/17/12 0659 07/18/12 0515  NA 140  --  141  K 3.2*  --  4.0  CL 105  --  105  CO2 25  --  27  GLUCOSE 101*  --  96  BUN 13  --  10  CREATININE 1.17 1.23 1.24    Recent Labs  07/17/12 1155 07/17/12 1745  TROPONINI <0.30 <0.30    Imaging: Ct Head Wo Contrast  07/17/2012  *RADIOLOGY REPORT*  Clinical Data: Headache, hypertension  CT HEAD WITHOUT CONTRAST  Technique:  Contiguous axial images were obtained from the base of the skull through the vertex without contrast.  Comparison: 11/13/2011 MRI  Findings: Subcortical white matter hypodensities are nonspecific, often seen with chronic microangiopathic change. There is no evidence for acute hemorrhage, hydrocephalus, mass lesion, or abnormal extra-axial fluid collection.  No definite CT evidence for acute infarction.  The to the can not from the lung The  visualized paranasal sinuses and mastoid air cells are predominately clear.  IMPRESSION: Nonspecific subcortical white matter hypodensities are similar to prior.  No CT evidence of acute intracranial abnormality.   Original Report Authenticated By: Jearld Lesch, M.D.    Dg Chest Port 1 View  07/17/2012  *RADIOLOGY REPORT*  Clinical Data: Chest pain, shortness of breath  PORTABLE CHEST - 1 VIEW  Comparison: 04/05/2010  Findings: Lungs are clear. No pleural effusion or pneumothorax. The cardiomediastinal contours are within normal limits. The visualized bones and soft tissues are without significant appreciable abnormality.  IMPRESSION: No radiographic evidence of acute cardiopulmonary process.   Original Report Authenticated By: Jearld Lesch, M.D.     Telemetry: NO adverse arrhythmias. Notable T-wave inversion, chronic Personally viewed.   EKG:  T-wave inversion precordial leads, chronic  Cardiac Studies:  Cardiac catheterization 2011: CONCLUSIONS: 1. There is severe disease in the first and second diagonal and distal     LAD disease.  These obstructions in total combined to cause a     strikingly abnormal appearing Cardiolite study.  The vessels are     small in the region where the obstructions are noted and the distal     territories are individually small.  The best therapy for this is     medical therapy. 2. Widely patent LAD throughout the entire proximal, mid and distal     segment before the apex.  The right coronary is large and widely     patent.  The circumflex is widely patent.  There is borderline     obstruction in a moderate size ramus intermedius branch. 3. Apical hypertrophic cardiomyopathy/concentric hypertrophy with     apical cavity obliteration secondary to hypertension.  EF is 75%.  PLAN:  Aggressive medical therapy.  Nitroglycerin for chest pain. Aggressive risk factor modification.  The patient is asked to get into contact with a primary care physician.  I  will also be happy to follow the patient along as his primary.   Assessment/Plan:  Principal Problem:   Atypical chest pain Active Problems:   HYPERTENSION   CAD (coronary artery disease)   Hypokalemia   Hypertensive urgency  52 year old male with significant hypertension, cardiac catheterization as described above, apical hypertrophy, hyperdynamic contractile performance, here with typical chest pain.  1. Chest pain-atypical, troponin have been reassuringly normal. I'm fine with him being discharged with followup with Dr. Katrinka Blazing. Hypertension control is of utmost importance. No need for further evaluation in the inpatient setting for nuclear stress test. This may be  evaluated as an outpatient.  2. Abnormal EKG-T wave inversion typical for apical hypertrophy variant of hypertrophic cardiomyopathy. Blood pressure control. Chronic.   3. Hypertension-continue to encourage medication, treatment. Please have him follow with primary physician as well.  4. CAD as described above.  We will sign off. I will get him followup. I'm fine with him being discharged. He knows to see medical attention if further symptoms worsen.    SKAINS, MARK 07/18/2012, 10:20 AM

## 2012-07-28 ENCOUNTER — Other Ambulatory Visit (INDEPENDENT_AMBULATORY_CARE_PROVIDER_SITE_OTHER): Payer: 59

## 2012-07-28 ENCOUNTER — Encounter: Payer: Self-pay | Admitting: Internal Medicine

## 2012-07-28 ENCOUNTER — Ambulatory Visit (INDEPENDENT_AMBULATORY_CARE_PROVIDER_SITE_OTHER): Payer: 59 | Admitting: Internal Medicine

## 2012-07-28 VITALS — BP 102/70 | HR 82 | Temp 97.8°F | Resp 16 | Ht 68.0 in | Wt 201.0 lb

## 2012-07-28 DIAGNOSIS — D649 Anemia, unspecified: Secondary | ICD-10-CM

## 2012-07-28 DIAGNOSIS — Z0000 Encounter for general adult medical examination without abnormal findings: Secondary | ICD-10-CM | POA: Insufficient documentation

## 2012-07-28 DIAGNOSIS — F528 Other sexual dysfunction not due to a substance or known physiological condition: Secondary | ICD-10-CM

## 2012-07-28 DIAGNOSIS — E785 Hyperlipidemia, unspecified: Secondary | ICD-10-CM

## 2012-07-28 DIAGNOSIS — D539 Nutritional anemia, unspecified: Secondary | ICD-10-CM | POA: Insufficient documentation

## 2012-07-28 DIAGNOSIS — I2510 Atherosclerotic heart disease of native coronary artery without angina pectoris: Secondary | ICD-10-CM

## 2012-07-28 DIAGNOSIS — I10 Essential (primary) hypertension: Secondary | ICD-10-CM

## 2012-07-28 LAB — CBC WITH DIFFERENTIAL/PLATELET
Basophils Absolute: 0.1 10*3/uL (ref 0.0–0.1)
Basophils Relative: 1.4 % (ref 0.0–3.0)
Eosinophils Absolute: 0.1 10*3/uL (ref 0.0–0.7)
Hemoglobin: 14.2 g/dL (ref 13.0–17.0)
Lymphocytes Relative: 33.7 % (ref 12.0–46.0)
MCHC: 33.3 g/dL (ref 30.0–36.0)
MCV: 88.2 fl (ref 78.0–100.0)
Monocytes Absolute: 0.6 10*3/uL (ref 0.1–1.0)
Neutro Abs: 3.1 10*3/uL (ref 1.4–7.7)
Neutrophils Relative %: 51.8 % (ref 43.0–77.0)
RBC: 4.83 Mil/uL (ref 4.22–5.81)
RDW: 12 % (ref 11.5–14.6)

## 2012-07-28 LAB — LIPID PANEL
Cholesterol: 171 mg/dL (ref 0–200)
HDL: 33.4 mg/dL — ABNORMAL LOW (ref >39.00)
LDL Cholesterol: 123 mg/dL — ABNORMAL HIGH (ref 0–99)
VLDL: 14.8 mg/dL (ref 0.0–40.0)

## 2012-07-28 LAB — URINALYSIS, ROUTINE W REFLEX MICROSCOPIC
Bilirubin Urine: NEGATIVE
Hgb urine dipstick: NEGATIVE
Ketones, ur: NEGATIVE
Total Protein, Urine: 30
Urine Glucose: NEGATIVE
pH: 6 (ref 5.0–8.0)

## 2012-07-28 LAB — COMPREHENSIVE METABOLIC PANEL
ALT: 14 U/L (ref 0–53)
AST: 19 U/L (ref 0–37)
Albumin: 4.5 g/dL (ref 3.5–5.2)
Alkaline Phosphatase: 84 U/L (ref 39–117)
BUN: 20 mg/dL (ref 6–23)
Potassium: 4.2 mEq/L (ref 3.5–5.1)
Sodium: 138 mEq/L (ref 135–145)
Total Protein: 7.6 g/dL (ref 6.0–8.3)

## 2012-07-28 LAB — IBC PANEL: Saturation Ratios: 29 % (ref 20.0–50.0)

## 2012-07-28 LAB — PSA: PSA: 1.69 ng/mL (ref 0.10–4.00)

## 2012-07-28 MED ORDER — EZETIMIBE 10 MG PO TABS: 10.0000 mg | ORAL_TABLET | Freq: Every day | ORAL | Status: DC

## 2012-07-28 MED ORDER — TADALAFIL 5 MG PO TABS: 5.0000 mg | ORAL_TABLET | Freq: Every day | ORAL | Status: DC | PRN

## 2012-07-28 NOTE — Progress Notes (Signed)
Subjective:    Patient ID: [name], male    DOB: [birth year], 52 y.o.   MRN: [id number]  Hypertension This is a chronic problem. The current episode started more than 1 year ago. The problem has been rapidly improving since onset. The problem is controlled. Pertinent negatives include no anxiety, blurred vision, chest pain, headaches, malaise/fatigue, neck pain, orthopnea, palpitations, peripheral edema, PND, shortness of breath or sweats. There are no associated agents to hypertension. Past treatments include angiotensin blockers, calcium channel blockers, diuretics and beta blockers. The current treatment provides significant improvement. There are no compliance problems.  Hypertensive end-organ damage includes CAD/MI.      Review of Systems  Constitutional: Negative for malaise/fatigue.  HENT: Negative.  Negative for neck pain.   Eyes: Negative.  Negative for blurred vision.  Respiratory: Negative.  Negative for cough, chest tightness, shortness of breath, wheezing and stridor.   Cardiovascular: Negative for chest pain, palpitations, orthopnea, leg swelling and PND.  Gastrointestinal: Negative for nausea, vomiting, abdominal pain, diarrhea, constipation and blood in stool.  Endocrine: Negative.   Genitourinary: Negative.   Musculoskeletal: Negative for myalgias, back pain, joint swelling, arthralgias and gait problem.  Skin: Negative for color change, pallor, rash and wound.  Allergic/Immunologic: Negative.   Neurological: Negative for dizziness, weakness, light-headedness and headaches.  Hematological: Negative.  Negative for adenopathy. Does not bruise/bleed easily.  Psychiatric/Behavioral: Negative.        Objective:   Physical Exam  Vitals reviewed. Constitutional: He is oriented to person, place, and time. He appears well-developed and well-nourished. No distress.  HENT:  Head: Normocephalic and atraumatic.  Mouth/Throat: Oropharynx is clear and moist. No  oropharyngeal exudate.  Eyes: Conjunctivae are normal. Right eye exhibits no discharge. Left eye exhibits no discharge. No scleral icterus.  Neck: Normal range of motion. Neck supple. No JVD present. No tracheal deviation present. No thyromegaly present.  Cardiovascular: Normal rate, regular rhythm, normal heart sounds and intact distal pulses.  Exam reveals no gallop and no friction rub.   No murmur heard. Pulmonary/Chest: Effort normal and breath sounds normal. No stridor. No respiratory distress. He has no wheezes. He has no rales. He exhibits no tenderness.  Abdominal: Soft. Bowel sounds are normal. He exhibits no distension and no mass. There is no tenderness. There is no rebound and no guarding. Hernia confirmed negative in the right inguinal area and confirmed negative in the left inguinal area.  Genitourinary: Prostate normal, testes normal and penis normal. Rectal exam shows external hemorrhoid and internal hemorrhoid. Rectal exam shows no fissure, no mass, no tenderness and anal tone normal. Guaiac negative stool. Prostate is not enlarged and not tender. Right testis shows no mass, no swelling and no tenderness. Right testis is descended. Left testis shows no mass, no swelling and no tenderness. Left testis is descended. Uncircumcised. No phimosis, paraphimosis, hypospadias, penile erythema or penile tenderness. No discharge found.  Musculoskeletal: Normal range of motion. He exhibits no edema and no tenderness.  Lymphadenopathy:    He has no cervical adenopathy.       Right: No inguinal adenopathy present.       Left: No inguinal adenopathy present.  Neurological: He is oriented to person, place, and time.  Skin: Skin is warm and dry. No rash noted. He is not diaphoretic. No erythema. No pallor.  Psychiatric: He has a normal mood and affect. His behavior is normal. Judgment and thought content normal.     Lab Results  Component Value Date   WBC 5.0  07/17/2012   HGB 12.7* 07/17/2012    HCT 37.8* 07/17/2012   PLT 226 07/17/2012   GLUCOSE 96 07/18/2012   CHOL 175 12/23/2011   TRIG 75.0 12/23/2011   HDL 43.00 12/23/2011   LDLCALC 117* 12/23/2011   ALT 13 12/23/2011   AST 20 12/23/2011   NA 141 07/18/2012   K 4.0 07/18/2012   CL 105 07/18/2012   CREATININE 1.24 07/18/2012   BUN 10 07/18/2012   CO2 27 07/18/2012   TSH 1.64 12/23/2011   PSA 1.34 10/12/2011   INR 1.01 04/05/2010   HGBA1C  Value: 5.1 (NOTE)                                                                       According to the ADA Clinical Practice Recommendations for 2011, when HbA1c is used as a screening test:   >=6.5%   Diagnostic of Diabetes Mellitus           (if abnormal result  is confirmed)  5.7-6.4%   Increased risk of developing Diabetes Mellitus  References:Diagnosis and Classification of Diabetes Mellitus,Diabetes Care,2011,34(Suppl 1):S62-S69 and Standards of Medical Care in         Diabetes - 2011,Diabetes Care,2011,34  (Suppl 1):S11-S61. 09/17/2009       Assessment & Plan:

## 2012-07-28 NOTE — Patient Instructions (Signed)
Health Maintenance, Males A healthy lifestyle and preventative care can promote health and wellness.  Maintain regular health, dental, and eye exams.  Eat a healthy diet. Foods like vegetables, fruits, whole grains, low-fat dairy products, and lean protein foods contain the nutrients you need without too many calories. Decrease your intake of foods high in solid fats, added sugars, and salt. Get information about a proper diet from your caregiver, if necessary.  Regular physical exercise is one of the most important things you can do for your health. Most adults should get at least 150 minutes of moderate-intensity exercise (any activity that increases your heart rate and causes you to sweat) each week. In addition, most adults need muscle-strengthening exercises on 2 or more days a week.   Maintain a healthy weight. The body mass index (BMI) is a screening tool to identify possible weight problems. It provides an estimate of body fat based on height and weight. Your caregiver can help determine your BMI, and can help you achieve or maintain a healthy weight. For adults 20 years and older:  A BMI below 18.5 is considered underweight.  A BMI of 18.5 to 24.9 is normal.  A BMI of 25 to 29.9 is considered overweight.  A BMI of 30 and above is considered obese.  Maintain normal blood lipids and cholesterol by exercising and minimizing your intake of saturated fat. Eat a balanced diet with plenty of fruits and vegetables. Blood tests for lipids and cholesterol should begin at age 20 and be repeated every 5 years. If your lipid or cholesterol levels are high, you are over 50, or you are a high risk for heart disease, you may need your cholesterol levels checked more frequently.Ongoing high lipid and cholesterol levels should be treated with medicines, if diet and exercise are not effective.  If you smoke, find out from your caregiver how to quit. If you do not use tobacco, do not start.  If you  choose to drink alcohol, do not exceed 2 drinks per day. One drink is considered to be 12 ounces (355 mL) of beer, 5 ounces (148 mL) of wine, or 1.5 ounces (44 mL) of liquor.  Avoid use of street drugs. Do not share needles with anyone. Ask for help if you need support or instructions about stopping the use of drugs.  High blood pressure causes heart disease and increases the risk of stroke. Blood pressure should be checked at least every 1 to 2 years. Ongoing high blood pressure should be treated with medicines if weight loss and exercise are not effective.  If you are 45 to 51 years old, ask your caregiver if you should take aspirin to prevent heart disease.  Diabetes screening involves taking a blood sample to check your fasting blood sugar level. This should be done once every 3 years, after age 45, if you are within normal weight and without risk factors for diabetes. Testing should be considered at a younger age or be carried out more frequently if you are overweight and have at least 1 risk factor for diabetes.  Colorectal cancer can be detected and often prevented. Most routine colorectal cancer screening begins at the age of 50 and continues through age 75. However, your caregiver may recommend screening at an earlier age if you have risk factors for colon cancer. On a yearly basis, your caregiver may provide home test kits to check for hidden blood in the stool. Use of a small camera at the end of a tube,   to directly examine the colon (sigmoidoscopy or colonoscopy), can detect the earliest forms of colorectal cancer. Talk to your caregiver about this at age 50, when routine screening begins. Direct examination of the colon should be repeated every 5 to 10 years through age 75, unless early forms of pre-cancerous polyps or small growths are found.  Hepatitis C blood testing is recommended for all people born from 1945 through 1965 and any individual with known risks for hepatitis C.  Healthy  men should no longer receive prostate-specific antigen (PSA) blood tests as part of routine cancer screening. Consult with your caregiver about prostate cancer screening.  Testicular cancer screening is not recommended for adolescents or adult males who have no symptoms. Screening includes self-exam, caregiver exam, and other screening tests. Consult with your caregiver about any symptoms you have or any concerns you have about testicular cancer.  Practice safe sex. Use condoms and avoid high-risk sexual practices to reduce the spread of sexually transmitted infections (STIs).  Use sunscreen with a sun protection factor (SPF) of 30 or greater. Apply sunscreen liberally and repeatedly throughout the day. You should seek shade when your shadow is shorter than you. Protect yourself by wearing long sleeves, pants, a wide-brimmed hat, and sunglasses year round, whenever you are outdoors.  Notify your caregiver of new moles or changes in moles, especially if there is a change in shape or color. Also notify your caregiver if a mole is larger than the size of a pencil eraser.  A one-time screening for abdominal aortic aneurysm (AAA) and surgical repair of large AAAs by sound wave imaging (ultrasonography) is recommended for ages 65 to 75 years who are current or former smokers.  Stay current with your immunizations. Document Released: 11/13/2007 Document Revised: 08/09/2011 Document Reviewed: 10/12/2010 [employer] Patient Information 2013 [employer].  

## 2012-07-29 NOTE — Assessment & Plan Note (Signed)
Exam done  Vaccines were reviewed He was referred for a colonoscopy Labs ordered Pt ed material was given 

## 2012-07-29 NOTE — Assessment & Plan Note (Signed)
His BP is well controlled 

## 2012-07-29 NOTE — Assessment & Plan Note (Addendum)
He is doing well on simvastatin I will check his FLP today  Late note: his LDL is > 100 so I have asked him to add zetia

## 2012-07-29 NOTE — Assessment & Plan Note (Signed)
I will recheck his CBC and will look at his vitamin levels as well 

## 2012-08-08 ENCOUNTER — Encounter: Payer: Self-pay | Admitting: Gastroenterology

## 2012-09-11 ENCOUNTER — Ambulatory Visit (AMBULATORY_SURGERY_CENTER): Payer: 59 | Admitting: *Deleted

## 2012-09-11 ENCOUNTER — Encounter: Payer: Self-pay | Admitting: Gastroenterology

## 2012-09-11 VITALS — Ht 68.0 in | Wt 202.0 lb

## 2012-09-11 DIAGNOSIS — Z1211 Encounter for screening for malignant neoplasm of colon: Secondary | ICD-10-CM

## 2012-09-11 MED ORDER — MOVIPREP 100 G PO SOLR: ORAL | Status: DC

## 2012-09-11 NOTE — Progress Notes (Signed)
Sent note to John Nulty,CRNA for review of patient's cardiac history to make sure pt is OK for [facility].

## 2012-09-25 ENCOUNTER — Ambulatory Visit (AMBULATORY_SURGERY_CENTER): Payer: 59 | Admitting: Gastroenterology

## 2012-09-25 ENCOUNTER — Encounter: Payer: Self-pay | Admitting: Gastroenterology

## 2012-09-25 VITALS — BP 119/74 | HR 74 | Temp 97.6°F | Resp 15 | Ht 68.0 in | Wt 202.0 lb

## 2012-09-25 DIAGNOSIS — Z800 Family history of malignant neoplasm of digestive organs: Secondary | ICD-10-CM

## 2012-09-25 DIAGNOSIS — Z1211 Encounter for screening for malignant neoplasm of colon: Secondary | ICD-10-CM

## 2012-09-25 LAB — HM COLONOSCOPY: HM Colonoscopy: NORMAL

## 2012-09-25 MED ORDER — SODIUM CHLORIDE 0.9 % IV SOLN: 500.0000 mL | INTRAVENOUS | Status: DC

## 2012-09-25 NOTE — Progress Notes (Signed)
Patient did not experience any of the following events: a burn prior to discharge; a fall within the facility; wrong site/side/patient/procedure/implant event; or a hospital transfer or hospital admission upon discharge from the facility. (G8907)Patient did not have preoperative order for IV antibiotic SSI prophylaxis. (G8918) ewm 

## 2012-09-25 NOTE — Patient Instructions (Addendum)
YOU HAD AN ENDOSCOPIC PROCEDURE TODAY AT THE [facility]: Refer to the procedure report that was given to you for any specific questions about what was found during the examination.  If the procedure report does not answer your questions, please call your gastroenterologist to clarify.  If you requested that your care partner not be given the details of your procedure findings, then the procedure report has been included in a sealed envelope for you to review at your convenience later.  YOU SHOULD EXPECT: Some feelings of bloating in the abdomen. Passage of more gas than usual.  Walking can help get rid of the air that was put into your GI tract during the procedure and reduce the bloating. If you had a lower endoscopy (such as a colonoscopy or flexible sigmoidoscopy) you may notice spotting of blood in your stool or on the toilet paper. If you underwent a bowel prep for your procedure, then you may not have a normal bowel movement for a few days.  DIET: Your first meal following the procedure should be a light meal and then it is ok to progress to your normal diet.  A half-sandwich or bowl of soup is an example of a good first meal.  Heavy or fried foods are harder to digest and may make you feel nauseous or bloated.  Likewise meals heavy in dairy and vegetables can cause extra gas to form and this can also increase the bloating.  Drink plenty of fluids but you should avoid alcoholic beverages for 24 hours.  ACTIVITY: Your care partner should take you home directly after the procedure.  You should plan to take it easy, moving slowly for the rest of the day.  You can resume normal activity the day after the procedure however you should NOT DRIVE or use heavy machinery for 24 hours (because of the sedation medicines used during the test).    SYMPTOMS TO REPORT IMMEDIATELY: A gastroenterologist can be reached at any hour.  During normal business hours, 8:30 AM to 5:00 PM Monday through Friday,  call [phone].  After hours and on weekends, please call the GI answering service at [phone] emergency number  who will take a message and have the physician on call contact you.   Following lower endoscopy (colonoscopy or flexible sigmoidoscopy):  Excessive amounts of blood in the stool  Significant tenderness or worsening of abdominal pains  Swelling of the abdomen that is new, acute  Fever of 100F or higher FOLLOW UP: If any biopsies were taken you will be contacted by phone or by letter within the next 1-3 weeks.  Call your gastroenterologist if you have not heard about the biopsies in 3 weeks.  Our staff will call the home number listed on your records the next business day following your procedure to check on you and address any questions or concerns that you may have at that time regarding the information given to you following your procedure. This is a courtesy call and so if there is no answer at the home number and we have not heard from you through the emergency physician on call, we will assume that you have returned to your regular daily activities without incident.  SIGNATURES/CONFIDENTIALITY: You and/or your care partner have signed paperwork which will be entered into your electronic medical record.  These signatures attest to the fact that that the information above on your After Visit Summary has been reviewed and is understood.  Full responsibility of the confidentiality  of this discharge information lies with you and/or your care-partner.  Repeat colon in 5 years

## 2012-09-25 NOTE — Op Note (Signed)
[facility] [address] [place], [address]   COLONOSCOPY PROCEDURE REPORT  PATIENT: [name], [name]  MR#: [id number] BIRTHDATE: June 18, 1960 , 52  yrs. old GENDER: Male ENDOSCOPIST: Mardella Layman, MD, [facility] REFERRED BY:  Etta Grandchild, M.D. PROCEDURE DATE:  09/25/2012 PROCEDURE:   Colonoscopy, screening ASA CLASS:   Class II INDICATIONS:Patient's immediate family history of colon cancer. MEDICATIONS: propofol (Diprivan) 300mg  IV  DESCRIPTION OF PROCEDURE:   After the risks and benefits and of the procedure were explained, informed consent was obtained.  A digital rectal exam revealed no abnormalities of the rectum.    The LB [id number]  endoscope was introduced through the anus and advanced to the cecum, which was identified by both the appendix and ileocecal valve .  The quality of the prep was excellent, using MoviPrep .  The instrument was then slowly withdrawn as the colon was fully examined.     COLON FINDINGS: A normal appearing cecum, ileocecal valve, and appendiceal orifice were identified.  The ascending, hepatic flexure, transverse, splenic flexure, descending, sigmoid colon and rectum appeared unremarkable.  No polyps or cancers were seen. Retroflexed views revealed no abnormalities.     The scope was then withdrawn from the patient and the procedure completed.  COMPLICATIONS: There were no complications. ENDOSCOPIC IMPRESSION: Normal colon ..no polyps noted...  RECOMMENDATIONS: Given your significant family history of colon cancer, you should have a repeat colonoscopy in 5 years   REPEAT EXAM:  cc:  _______________________________ eSignedMardella Layman, MD, [facility] 09/25/2012 9:15 AM

## 2012-09-26 ENCOUNTER — Telehealth: Payer: Self-pay

## 2012-09-26 NOTE — Telephone Encounter (Signed)
  Follow up Call-  Call back number 09/25/2012  Post procedure Call Back phone  # [phone]  Permission to leave phone message Yes     Patient questions:  Do you have a fever, pain , or abdominal swelling? no Pain Score  0 *  Have you tolerated food without any problems? yes  Have you been able to return to your normal activities? yes  Do you have any questions about your discharge instructions: Diet   no Medications  no Follow up visit  no  Do you have questions or concerns about your Care? no  Actions: * If pain score is 4 or above: No action needed, pain <4.

## 2012-11-07 ENCOUNTER — Emergency Department (HOSPITAL_COMMUNITY): Payer: 59

## 2012-11-07 ENCOUNTER — Emergency Department (HOSPITAL_COMMUNITY)
Admission: EM | Admit: 2012-11-07 | Discharge: 2012-11-07 | Disposition: A | Discharge status: Home / Self Care | Payer: 59 | Attending: Emergency Medicine | Admitting: Emergency Medicine

## 2012-11-07 ENCOUNTER — Encounter (HOSPITAL_COMMUNITY): Payer: Self-pay | Admitting: *Deleted

## 2012-11-07 DIAGNOSIS — R1013 Epigastric pain: Secondary | ICD-10-CM | POA: Insufficient documentation

## 2012-11-07 DIAGNOSIS — I10 Essential (primary) hypertension: Secondary | ICD-10-CM | POA: Insufficient documentation

## 2012-11-07 DIAGNOSIS — R079 Chest pain, unspecified: Secondary | ICD-10-CM | POA: Insufficient documentation

## 2012-11-07 DIAGNOSIS — Z8673 Personal history of transient ischemic attack (TIA), and cerebral infarction without residual deficits: Secondary | ICD-10-CM | POA: Insufficient documentation

## 2012-11-07 DIAGNOSIS — E785 Hyperlipidemia, unspecified: Secondary | ICD-10-CM | POA: Insufficient documentation

## 2012-11-07 DIAGNOSIS — Z79899 Other long term (current) drug therapy: Secondary | ICD-10-CM | POA: Insufficient documentation

## 2012-11-07 DIAGNOSIS — F17200 Nicotine dependence, unspecified, uncomplicated: Secondary | ICD-10-CM | POA: Insufficient documentation

## 2012-11-07 DIAGNOSIS — Z7982 Long term (current) use of aspirin: Secondary | ICD-10-CM | POA: Insufficient documentation

## 2012-11-07 DIAGNOSIS — I422 Other hypertrophic cardiomyopathy: Secondary | ICD-10-CM | POA: Insufficient documentation

## 2012-11-07 DIAGNOSIS — I252 Old myocardial infarction: Secondary | ICD-10-CM | POA: Insufficient documentation

## 2012-11-07 DIAGNOSIS — Z8669 Personal history of other diseases of the nervous system and sense organs: Secondary | ICD-10-CM | POA: Insufficient documentation

## 2012-11-07 DIAGNOSIS — R630 Anorexia: Secondary | ICD-10-CM | POA: Insufficient documentation

## 2012-11-07 DIAGNOSIS — I2510 Atherosclerotic heart disease of native coronary artery without angina pectoris: Secondary | ICD-10-CM | POA: Insufficient documentation

## 2012-11-07 LAB — LIPASE, BLOOD: Lipase: 34 U/L (ref 11–59)

## 2012-11-07 LAB — BASIC METABOLIC PANEL
BUN: 15 mg/dL (ref 6–23)
Chloride: 106 mEq/L (ref 96–112)
GFR calc Af Amer: 67 mL/min — ABNORMAL LOW (ref >90)
GFR calc non Af Amer: 58 mL/min — ABNORMAL LOW (ref >90)
Potassium: 3.6 mEq/L (ref 3.5–5.1)
Sodium: 141 mEq/L (ref 135–145)

## 2012-11-07 LAB — HEPATIC FUNCTION PANEL
ALT: 9 U/L (ref 0–53)
Bilirubin, Direct: <0.1 mg/dL (ref 0.0–0.3)
Total Protein: 7.5 g/dL (ref 6.0–8.3)

## 2012-11-07 LAB — CBC
HCT: 37.1 % — ABNORMAL LOW (ref 39.0–52.0)
RDW: 12.1 % (ref 11.5–15.5)
WBC: 6.3 10*3/uL (ref 4.0–10.5)

## 2012-11-07 LAB — POCT I-STAT TROPONIN I: Troponin i, poc: 0 ng/mL (ref 0.00–0.08)

## 2012-11-07 MED ORDER — GI COCKTAIL ~~LOC~~
30.0000 mL | Freq: Once | ORAL | Status: AC
  Administered 2012-11-07: 30 mL via ORAL
  Filled 2012-11-07: qty 30

## 2012-11-07 MED ORDER — RANITIDINE HCL 150 MG PO TABS: 150.0000 mg | ORAL_TABLET | Freq: Two times a day (BID) | ORAL | Status: DC

## 2012-11-07 MED ORDER — NITROGLYCERIN 0.4 MG SL SUBL: 0.4000 mg | SUBLINGUAL_TABLET | SUBLINGUAL | Status: DC | PRN

## 2012-11-07 MED ORDER — ASPIRIN 325 MG PO TABS
325.0000 mg | ORAL_TABLET | Freq: Once | ORAL | Status: AC
  Administered 2012-11-07: 325 mg via ORAL
  Filled 2012-11-07: qty 1

## 2012-11-07 MED ORDER — OMEPRAZOLE 20 MG PO CPDR: 20.0000 mg | DELAYED_RELEASE_CAPSULE | Freq: Every day | ORAL | Status: DC

## 2012-11-07 MED ORDER — OXYCODONE-ACETAMINOPHEN 5-325 MG PO TABS
2.0000 | ORAL_TABLET | Freq: Once | ORAL | Status: AC
  Administered 2012-11-07: 2 via ORAL
  Filled 2012-11-07: qty 2

## 2012-11-07 MED ORDER — PERCOCET 5-325 MG PO TABS: 1.0000 | ORAL_TABLET | Freq: Four times a day (QID) | ORAL | Status: DC | PRN

## 2012-11-07 NOTE — ED Provider Notes (Signed)
7:59 AM  Date: 11/07/2012  Rate: 60  Rhythm: normal sinus rhythm  QRS Axis: normal  Intervals: normal  ST/T Wave abnormalities: Inverted T waves in inferior and lateral leads.  Conduction Disutrbances:none  Narrative Interpretation: Abnormal EKG  Old EKG Reviewed: unchanged      Carleene Cooper III, MD 11/07/12 [phone]

## 2012-11-07 NOTE — ED Notes (Signed)
Pt was working inside on Saturday and he ate and then started having cramping in his abdomen.  Pt states that last nite the pain was in his chest to mid epigastric area.  No sob

## 2012-11-07 NOTE — ED Provider Notes (Signed)
History     CSN: [id number]  Arrival date & time 11/07/12  4098   First MD Initiated Contact with Patient 11/07/12 0801      Chief Complaint  Patient presents with  . Chest Pain  . Abdominal Pain    (Consider location/radiation/quality/duration/timing/severity/associated sxs/prior treatment) HPI [name] is a 52 y.o. male with a history of CAD ( last passed in 2011), hypertension, and hypertrophic cardiomyopathy presents emergency department complaining of epigastric abdominal pain.  Agents states onset of symptoms began Saturday around 430 p.m. after eating a hotdog at a church barbecue.  Pain at that time was constant for the next 2 days and has mildly eased up some becoming more intermittent in nature.  Patient has taken Prevacid x2 without relief.  Pain is described as sharp rated at 6/10 in severity.  Epigastric pain is not positional or exertional.  Pain is exacerbated by palpation and anything orally consumed.  Associated symptoms of anorexia.  Patient denies associated symptoms including shortness of breath, nausea, diaphoresis, leg swelling, fevers, night sweats, chills, melena, hematochezia, hematemesis, change in bowel movements.  Past Medical History  Diagnosis Date  . CAD (coronary artery disease) 10/12/2011    Mild to mod dz on cath 2011;  Dr Verdis Prime  . Hypertension   . Hyperlipidemia   . HOH (hard of hearing) 07/2011    rgiht ear  . Myocardial infarction     x2; 2 years ago and last year per pt.  . Stroke     Past Surgical History  Procedure Laterality Date  . Inguinal hernia repair      Family History  Problem Relation Age of Onset  . Hypertension Mother   . Heart failure Mother   . Hypertension Father   . Colon cancer Father 51  . Colon cancer Paternal Uncle     History  Substance Use Topics  . Smoking status: Current Every Day Smoker -- 0.50 packs/day    Types: Cigarettes  . Smokeless tobacco: Not on file  . Alcohol Use: Yes   Comment: monthly      Review of Systems Ten systems reviewed and are negative for acute change, except as noted in the HPI.    Allergies  Lisinopril  Home Medications   Current Outpatient Rx  Name  Route  Sig  Dispense  Refill  . amLODipine (NORVASC) 10 MG tablet   Oral   Take 10 mg by mouth daily.         Marland Kitchen aspirin 325 MG tablet   Oral   Take 325 mg by mouth daily.         Marland Kitchen ezetimibe (ZETIA) 10 MG tablet   Oral   Take 1 tablet (10 mg total) by mouth daily.   90 tablet   3   . hydrochlorothiazide (HYDRODIURIL) 25 MG tablet   Oral   Take 1 tablet (25 mg total) by mouth daily.   30 tablet   1   . labetalol (NORMODYNE) 200 MG tablet   Oral   Take 200 mg by mouth 2 (two) times daily.         Marland Kitchen olmesartan (BENICAR) 40 MG tablet   Oral   Take 40 mg by mouth daily.         . potassium chloride SA (K-DUR,KLOR-CON) 20 MEQ tablet   Oral   Take 1 tablet (20 mEq total) by mouth daily.   30 tablet   1   . simvastatin (ZOCOR) 40 MG tablet  Oral   Take 40 mg by mouth every evening.           BP 150/101  Pulse 51  Temp(Src) 98.7 F (37.1 C) (Oral)  Resp 14  SpO2 100%  Physical Exam  Nursing note and vitals reviewed. Constitutional: He appears well-developed and well-nourished. No distress.  Hypertensive   HENT:  Head: Normocephalic and atraumatic.  Eyes: Conjunctivae and EOM are normal. Pupils are equal, round, and reactive to light.  Neck: Normal range of motion. Neck supple. Normal carotid pulses and no JVD present. Carotid bruit is not present. No rigidity. Normal range of motion present.  Cardiovascular: Normal rate, regular rhythm, S1 normal, S2 normal, normal heart sounds, intact distal pulses and normal pulses.  Exam reveals no gallop and no friction rub.   No murmur heard. No pitting edema bilaterally, RRR, no aberrant sounds on auscultations, distal pulses intact, no carotid bruit or JVD.   Pulmonary/Chest: Effort normal and breath sounds  normal. No accessory muscle usage or stridor. No respiratory distress. He exhibits no tenderness and no bony tenderness.  Abdominal: Soft. Bowel sounds are normal. He exhibits no distension. There is tenderness. There is no rebound.    Soft non tender. Non pulsatile aorta.   Skin: Skin is warm, dry and intact. No rash noted. He is not diaphoretic. No cyanosis. Nails show no clubbing.    ED Course  Procedures (including critical care time)  Labs Reviewed  CBC - Abnormal; Notable for the following:    Hemoglobin 12.9 (*)    HCT 37.1 (*)    All other components within normal limits  BASIC METABOLIC PANEL - Abnormal; Notable for the following:    Creatinine, Ser 1.38 (*)    GFR calc non Af Amer 58 (*)    GFR calc Af Amer 67 (*)    All other components within normal limits  LIPASE, BLOOD  HEPATIC FUNCTION PANEL  POCT I-STAT TROPONIN I   Dg Chest 2 View  11/07/2012   *RADIOLOGY REPORT*  Clinical Data: Upper abdominal/mid chest pain  CHEST - 2 VIEW  Comparison: 07/17/2012  Findings: Lungs are clear. No pleural effusion or pneumothorax.  Cardiomediastinal silhouette is within normal limits.  Visualized osseous structures are within normal limits.  IMPRESSION: Normal chest radiographs.   Original Report Authenticated By: Charline Bills, M.D.   US Abdomen Complete  11/07/2012   *RADIOLOGY REPORT*  Abdominal ultrasound  History:  Abdominal pain  Comparison:  Renal ultrasound December 12, 2006  Findings:  Gallbladder is visualized in multiple projections. There are no gallstones, gallbladder wall thickening, or pericholecystic fluid collection.  There is no intrahepatic, common hepatic, or common bile duct dilatation.  Pancreas appears normal.  No focal liver lesions are identified.  Spleen is normal in size and homogeneous in echotexture.  Kidneys bilaterally appear normal. There is no ascites.  Aorta is nonaneurysmal.  Inferior vena cava appears normal.  Conclusion:  Study within normal limits.    Original Report Authenticated By: Bretta Bang, M.D.   Cardiac Studies: Cardiac catheterization November 01, 2009:  CONCLUSIONS:  1. There is severe disease in the first and second diagonal and distal  LAD disease. These obstructions in total combined to cause a  strikingly abnormal appearing Cardiolite study. The vessels are  small in the region where the obstructions are noted and the distal  territories are individually small. The best therapy for this is  medical therapy.  2. Widely patent LAD throughout the entire proximal, mid and distal  segment  before the apex. The right coronary is large and widely  patent. The circumflex is widely patent. There is borderline  obstruction in a moderate size ramus intermedius branch.  3. Apical hypertrophic cardiomyopathy/concentric hypertrophy with  apical cavity obliteration secondary to hypertension. EF is 75%.  No diagnosis found.  Consult to cardiology: Dr. Katrinka Blazing to see in ED --> very low concern for ACS etiology. PCP follow up and cont taking BP medication  MDM  Hypertension Epigastric abdominal pain   Patient is to be discharged with recommendation to follow up with PCP in regards to today's hospital visit. Chest pain is not likely of cardiac or pulmonary etiology d/t presentation, perc negative, VSS, no tracheal deviation, no JVD or new murmur, RRR, breath sounds equal bilaterally, EKG without acute abnormalities, negative troponin, and negative CXR. Pt has been advised start a PPI and return to the ED is CP becomes exertional, associated with diaphoresis or nausea, radiates to left jaw/arm, worsens or becomes concerning in any way. Pt appears reliable for follow up and is agreeable to discharge.   Case has been discussed with and seen by Dr. Katrinka Blazing, cardiology who agrees with the above plan to discharge.          Jaci Carrel, [place] 11/07/12 1112

## 2012-11-07 NOTE — ED Provider Notes (Signed)
Medical screening examination/treatment/procedure(s) were performed by non-physician practitioner and as supervising physician I was immediately available for consultation/collaboration.   Carleene Cooper III, MD 11/07/12 Ernestina [place]

## 2012-11-07 NOTE — ED Notes (Signed)
Pt discharged.Vital signs stable and GCS 15 

## 2012-11-07 NOTE — ED Notes (Signed)
Admit date: 11/07/2012 Referring Physician  ED P.A.,  PAZ Primary Physician  Dr Yetta Barre Primary Cardiologist  [facility] Reason for Consultation: h/o Heart disease: Epigastric pain  ASSESSMENT: 1. Epigastric discomfort, etiology uncertain. Not felt to be cardiac in nature. Suspect GI origin  2. Hypertrophic cardiomyopathy  3. Coronary atherosclerosis involving branch vessels of the LAD. No significant obstructive disease involving the major coronary arteries at cath 2011. Current symptoms have no features consistent with CAD or ACS  4. Anemia  PLAN: 1. Would do GI investigation. There is no role for cardiac evaluation given the current presentation   HPI: Mr. Lofaro is known to me with a history of hypertrophic cardiomyopathy possibly secondary to poorly controlled blood pressure. He also has significant branch vessel coronary disease involving small diagonals of the LAD. At the time of cath in 2011 he had no significant obstructive disease in any of the major epicardial coronary arteries. He is an active gentleman able to perform heavy labor without significant cardiovascular limitations.  Starting 4 days ago he began experiencing epigastric discomfort after eating at a picnic. The discomfort is been continuous. It seems to be aggravated by eating. No nausea or vomiting. No positional component. There is no exertional component. He denies melena. No hematochezia. He denies dyspnea.   PMH:   Past Medical History  Diagnosis Date  . CAD (coronary artery disease) 10/12/2011    Mild to mod dz on cath 2011;  Dr Verdis Prime  . Hypertension   . Hyperlipidemia   . HOH (hard of hearing) 07/2011    rgiht ear  . Myocardial infarction     x2; 2 years ago and last year per pt.  . Stroke      PSH:   Past Surgical History  Procedure Laterality Date  . Inguinal hernia repair      Allergies:  Lisinopril Prior to Admit Meds:   (Not in a hospital admission) Fam HX:    Family History    Problem Relation Age of Onset  . Hypertension Mother   . Heart failure Mother   . Hypertension Father   . Colon cancer Father 73  . Colon cancer Paternal Uncle    Social HX:    History   Social History  . Marital Status: Married    Spouse Name: N/A    Number of Children: N/A  . Years of Education: N/A   Occupational History  . Not on file.   Social History Main Topics  . Smoking status: Current Every Day Smoker -- 0.50 packs/day    Types: Cigarettes  . Smokeless tobacco: Not on file  . Alcohol Use: Yes     Comment: monthly  . Drug Use: No  . Sexually Active: Yes   Other Topics Concern  . Not on file   Social History Narrative  . No narrative on file     Review of Systems: No CV or neuro complaints.  Physical Exam: Blood pressure 133/64, pulse 42, temperature 98 F (36.7 C), temperature source Oral, resp. rate 16, SpO2 100.00%. Weight change:    The patient is lying on his left side in a somewhat fetal position in the ER. He easily greets me but appears to be in some discomfort.  HEENT exam is unremarkable.  No JVD or carotid bruits are heard.  Lungs are clear to auscultation and percussion.  Cardiac exam does not reveal a paracardial friction rub or murmur. No chest wall tenderness is noted.  Abdomen in the subxiphoid  area is mildly tender. Bowel sounds are normal. Liver edge is not palpable. No rebound is noted.  Extremities reveal no edema. Posterior tibial and radial pulses are 2+ and symmetric.  Neurological exam reveals patient who is somewhat flat affect. No focal deficits. Labs:   Lab Results  Component Value Date   WBC 6.3 11/07/2012   HGB 12.9* 11/07/2012   HCT 37.1* 11/07/2012   MCV 86.3 11/07/2012   PLT 247 11/07/2012    Recent Labs Lab 11/07/12 0756  NA 141  K 3.6  CL 106  CO2 26  BUN 15  CREATININE 1.38*  CALCIUM 9.1  PROT 7.5  BILITOT 0.5  ALKPHOS 79  ALT 9  AST 16  GLUCOSE 88   No results found for this basename: PTT    Lab Results  Component Value Date   INR 1.01 04/05/2010   INR 1.04 09/17/2009   Lab Results  Component Value Date   CKTOTAL 237* 03/05/2011   CKMB 2.6 03/05/2011   TROPONINI <0.30 07/17/2012     Lab Results  Component Value Date   CHOL 171 07/28/2012   CHOL 175 12/23/2011   CHOL 178 03/05/2011   Lab Results  Component Value Date   HDL 33.40* 07/28/2012   HDL 43.00 12/23/2011   HDL 40 03/05/2011   Lab Results  Component Value Date   LDLCALC 123* 07/28/2012   LDLCALC 117* 12/23/2011   LDLCALC 123* 03/05/2011   Lab Results  Component Value Date   TRIG 74.0 07/28/2012   TRIG 75.0 12/23/2011   TRIG 75 03/05/2011   Lab Results  Component Value Date   CHOLHDL 5 07/28/2012   CHOLHDL 4 12/23/2011   CHOLHDL 4.5 03/05/2011   No results found for this basename: LDLDIRECT      Radiology:  Dg Chest 2 View  11/07/2012   *RADIOLOGY REPORT*  Clinical Data: Upper abdominal/mid chest pain  CHEST - 2 VIEW  Comparison: 07/17/2012  Findings: Lungs are clear. No pleural effusion or pneumothorax.  Cardiomediastinal silhouette is within normal limits.  Visualized osseous structures are within normal limits.  IMPRESSION: Normal chest radiographs.   Original Report Authenticated By: Charline Bills, M.D.   US Abdomen Complete  11/07/2012   *RADIOLOGY REPORT*  Abdominal ultrasound  History:  Abdominal pain  Comparison:  Renal ultrasound December 12, 2006  Findings:  Gallbladder is visualized in multiple projections. There are no gallstones, gallbladder wall thickening, or pericholecystic fluid collection.  There is no intrahepatic, common hepatic, or common bile duct dilatation.  Pancreas appears normal.  No focal liver lesions are identified.  Spleen is normal in size and homogeneous in echotexture.  Kidneys bilaterally appear normal. There is no ascites.  Aorta is nonaneurysmal.  Inferior vena cava appears normal.  Conclusion:  Study within normal limits.   Original Report Authenticated By: Bretta Bang,  M.D.   EKG:  Normal sinus rhythm with prominent voltage and T wave abnormality consistent with hypertrophy    [name] 11/07/2012 11:14 AM

## 2012-11-14 ENCOUNTER — Encounter: Payer: Self-pay | Admitting: Internal Medicine

## 2012-11-14 ENCOUNTER — Ambulatory Visit (INDEPENDENT_AMBULATORY_CARE_PROVIDER_SITE_OTHER): Payer: 59 | Admitting: Internal Medicine

## 2012-11-14 VITALS — BP 138/92 | HR 61 | Temp 97.8°F | Ht 68.0 in | Wt 200.8 lb

## 2012-11-14 DIAGNOSIS — I10 Essential (primary) hypertension: Secondary | ICD-10-CM

## 2012-11-14 DIAGNOSIS — R1013 Epigastric pain: Secondary | ICD-10-CM

## 2012-11-14 DIAGNOSIS — K219 Gastro-esophageal reflux disease without esophagitis: Secondary | ICD-10-CM

## 2012-11-14 MED ORDER — OMEPRAZOLE 40 MG PO CPDR: 40.0000 mg | DELAYED_RELEASE_CAPSULE | Freq: Every day | ORAL | Status: DC

## 2012-11-14 NOTE — Assessment & Plan Note (Signed)
Better controlled Continue to monitor Continue current meds

## 2012-11-14 NOTE — Assessment & Plan Note (Signed)
Will increase prilosec to 40 mg daily Continue zantac as needed Avoid trigger foods If not better in 2 weeks, consider CT scan to r/o hiatal hernia

## 2012-11-14 NOTE — Progress Notes (Signed)
Subjective:    Patient ID: [name], male    DOB: [birth year], 52 y.o.   MRN: [id number]  HPI  Pt presents to the clinic today for a hospital f/u. He went to the ER for epigastric pain. Given his history of CAD s/p cath in 2011, and history of MI x 3 the ER performed a ACS workup which was negative. His epigastric pain was felt to be more GERD related. He was started on prilosec and zantac and asked to f/u with his PCP. Since his ER visit, he is feeling a little better. He feels like the prilosec is helping a little bit. The pain is more intermittent instead of constant. He describes it as a sharp stabing pain. He does not drink alcohol. He does smoke and does chew gum but has cut back since his ER visit. He is have regular BM's, denies blood in the stool. He does not take aleve or advil. He has a remote history of using goodie's powders. He denies chest pain, chest tightness or shortness of breath.  Review of Systems      Past Medical History  Diagnosis Date  . CAD (coronary artery disease) 10/12/2011    Mild to mod dz on cath 2011;  Dr Verdis Prime  . Hypertension   . Hyperlipidemia   . HOH (hard of hearing) 07/2011    rgiht ear  . Myocardial infarction     x2; 2 years ago and last year per pt.  . Stroke     Current Outpatient Prescriptions  Medication Sig Dispense Refill  . amLODipine (NORVASC) 10 MG tablet Take 10 mg by mouth daily.      Marland Kitchen aspirin 325 MG tablet Take 325 mg by mouth daily.      Marland Kitchen ezetimibe (ZETIA) 10 MG tablet Take 1 tablet (10 mg total) by mouth daily.  90 tablet  3  . hydrochlorothiazide (HYDRODIURIL) 25 MG tablet Take 1 tablet (25 mg total) by mouth daily.  30 tablet  1  . labetalol (NORMODYNE) 200 MG tablet Take 200 mg by mouth 2 (two) times daily.      Marland Kitchen olmesartan (BENICAR) 40 MG tablet Take 40 mg by mouth daily.      Marland Kitchen omeprazole (PRILOSEC) 20 MG capsule Take 1 capsule (20 mg total) by mouth daily.  30 capsule  1  . PERCOCET 5-325 MG per tablet Take 1  tablet by mouth every 6 (six) hours as needed for pain.  15 tablet  0  . potassium chloride SA (K-DUR,KLOR-CON) 20 MEQ tablet Take 1 tablet (20 mEq total) by mouth daily.  30 tablet  1  . ranitidine (ZANTAC) 150 MG tablet Take 1 tablet (150 mg total) by mouth 2 (two) times daily.  30 tablet  0  . simvastatin (ZOCOR) 40 MG tablet Take 40 mg by mouth every evening.       No current facility-administered medications for this visit.    Allergies  Allergen Reactions  . Lisinopril Swelling    Family History  Problem Relation Age of Onset  . Hypertension Mother   . Heart failure Mother   . Hypertension Father   . Colon cancer Father 49  . Colon cancer Paternal Uncle     History   Social History  . Marital Status: Married    Spouse Name: N/A    Number of Children: N/A  . Years of Education: N/A   Occupational History  . Not on file.   Social History Main Topics  .  Smoking status: Current Every Day Smoker -- 0.50 packs/day    Types: Cigarettes  . Smokeless tobacco: Not on file  . Alcohol Use: Yes     Comment: monthly  . Drug Use: No  . Sexually Active: Yes   Other Topics Concern  . Not on file   Social History Narrative  . No narrative on file     Constitutional: Denies fever, malaise, fatigue, headache or abrupt weight changes. Marland Kitchen Respiratory: Denies difficulty breathing, shortness of breath, cough or sputum production.   Cardiovascular: Denies chest pain, chest tightness, palpitations or swelling in the hands or feet.  Gastrointestinal: Pt reports epigastric pain. Denies bloating, constipation, diarrhea or blood in the stool.  GU: Denies urgency, frequency, pain with urination, burning sensation, blood in urine, odor or discharge.  Skin: Denies redness, rashes, lesions or ulcercations.     No other specific complaints in a complete review of systems (except as listed in HPI above).  Objective:   Physical Exam   BP 138/92  Pulse 61  Temp(Src) 97.8 F (36.6 C)  (Oral)  Ht 5\' 8"  (1.727 m)  Wt 200 lb 12.8 oz (91.082 kg)  BMI 30.54 kg/m2  SpO2 97% Wt Readings from Last 3 Encounters:  11/14/12 200 lb 12.8 oz (91.082 kg)  09/25/12 202 lb (91.627 kg)  09/11/12 202 lb (91.627 kg)    General: Appears hisstated age, well developed, well nourished in NAD. Skin: Warm, dry and intact. No rashes, lesions or ulcerations noted.  Cardiovascular: Normal rate and rhythm. S1,S2 noted.  No murmur, rubs or gallops noted. No JVD or BLE edema. No carotid bruits noted. Pulmonary/Chest: Normal effort and positive vesicular breath sounds. No respiratory distress. No wheezes, rales or ronchi noted.  Abdomen: Soft and tender in the epigastric region. Normal bowel sounds, no bruits noted. No distention or masses noted. Liver, spleen and kidneys non palpable.   BMET    Component Value Date/Time   NA 141 11/07/2012 0756   K 3.6 11/07/2012 0756   CL 106 11/07/2012 0756   CO2 26 11/07/2012 0756   GLUCOSE 88 11/07/2012 0756   BUN 15 11/07/2012 0756   CREATININE 1.38* 11/07/2012 0756   CALCIUM 9.1 11/07/2012 0756   GFRNONAA 58* 11/07/2012 0756   GFRAA 67* 11/07/2012 0756    Lipid Panel     Component Value Date/Time   CHOL 171 07/28/2012 0908   TRIG 74.0 07/28/2012 0908   HDL 33.40* 07/28/2012 0908   CHOLHDL 5 07/28/2012 0908   VLDL 14.8 07/28/2012 0908   LDLCALC 123* 07/28/2012 0908    CBC    Component Value Date/Time   WBC 6.3 11/07/2012 0756   RBC 4.30 11/07/2012 0756   HGB 12.9* 11/07/2012 0756   HCT 37.1* 11/07/2012 0756   PLT 247 11/07/2012 0756   MCV 86.3 11/07/2012 0756   MCH 30.0 11/07/2012 0756   MCHC 34.8 11/07/2012 0756   RDW 12.1 11/07/2012 0756   LYMPHSABS 2.0 07/28/2012 0908   MONOABS 0.6 07/28/2012 0908   EOSABS 0.1 07/28/2012 0908   BASOSABS 0.1 07/28/2012 0908    Hgb A1C Lab Results  Component Value Date   HGBA1C  Value: 5.1 (NOTE)  According to the ADA Clinical Practice Recommendations  for 2011, when HbA1c is used as a screening test:   >=6.5%   Diagnostic of Diabetes Mellitus           (if abnormal result  is confirmed)  5.7-6.4%   Increased risk of developing Diabetes Mellitus  References:Diagnosis and Classification of Diabetes Mellitus,Diabetes Care,2011,34(Suppl 1):S62-S69 and Standards of Medical Care in         Diabetes - 2011,Diabetes Care,2011,34  (Suppl 1):S11-S61. 09/17/2009        Assessment & Plan:   Reviewed all ER notes from 11/07/2012, including lab studies and imaging. Data to be reviewed approximately 10 minutes. Greater than 50% of this visit spent counseling pt on diet changes and treatment modalities for GERD

## 2012-11-14 NOTE — Patient Instructions (Signed)

## 2012-11-14 NOTE — Assessment & Plan Note (Signed)
Likely related to GERD Continue percocet for pain as needed Avoid aleve and ibuprofen

## 2013-03-22 ENCOUNTER — Encounter (HOSPITAL_COMMUNITY): Payer: Self-pay | Admitting: Emergency Medicine

## 2013-03-22 ENCOUNTER — Emergency Department (HOSPITAL_COMMUNITY)
Admission: EM | Admit: 2013-03-22 | Discharge: 2013-03-22 | Disposition: A | Discharge status: Home / Self Care | Payer: 59 | Attending: Emergency Medicine | Admitting: Emergency Medicine

## 2013-03-22 DIAGNOSIS — Z8673 Personal history of transient ischemic attack (TIA), and cerebral infarction without residual deficits: Secondary | ICD-10-CM | POA: Insufficient documentation

## 2013-03-22 DIAGNOSIS — E785 Hyperlipidemia, unspecified: Secondary | ICD-10-CM | POA: Insufficient documentation

## 2013-03-22 DIAGNOSIS — I252 Old myocardial infarction: Secondary | ICD-10-CM | POA: Insufficient documentation

## 2013-03-22 DIAGNOSIS — G8929 Other chronic pain: Secondary | ICD-10-CM | POA: Insufficient documentation

## 2013-03-22 DIAGNOSIS — Z79899 Other long term (current) drug therapy: Secondary | ICD-10-CM | POA: Insufficient documentation

## 2013-03-22 DIAGNOSIS — F17200 Nicotine dependence, unspecified, uncomplicated: Secondary | ICD-10-CM | POA: Insufficient documentation

## 2013-03-22 DIAGNOSIS — I10 Essential (primary) hypertension: Secondary | ICD-10-CM | POA: Insufficient documentation

## 2013-03-22 DIAGNOSIS — I2510 Atherosclerotic heart disease of native coronary artery without angina pectoris: Secondary | ICD-10-CM | POA: Insufficient documentation

## 2013-03-22 DIAGNOSIS — G43909 Migraine, unspecified, not intractable, without status migrainosus: Secondary | ICD-10-CM | POA: Insufficient documentation

## 2013-03-22 DIAGNOSIS — Z7982 Long term (current) use of aspirin: Secondary | ICD-10-CM | POA: Insufficient documentation

## 2013-03-22 MED ORDER — DIPHENHYDRAMINE HCL 50 MG/ML IJ SOLN
25.0000 mg | Freq: Once | INTRAMUSCULAR | Status: AC
  Administered 2013-03-22: 25 mg via INTRAVENOUS
  Filled 2013-03-22: qty 1

## 2013-03-22 MED ORDER — METOCLOPRAMIDE HCL 5 MG/ML IJ SOLN
10.0000 mg | Freq: Once | INTRAMUSCULAR | Status: AC
  Administered 2013-03-22: 10 mg via INTRAVENOUS
  Filled 2013-03-22: qty 2

## 2013-03-22 NOTE — ED Notes (Signed)
Pt reports right sided headache since Friday. C/o sensitivity to light today. Pt alert, oriented x4, denies slurred speech, weakness. Denies fever/ trauma/injury.

## 2013-03-22 NOTE — ED Notes (Signed)
Pt. C/o HA since last Friday, reports light sensitivity. Alert and oriented x4. Hx of same but "It doesn't usually last this long". CNS intact.

## 2013-03-22 NOTE — ED Provider Notes (Signed)
CSN: [id number]     Arrival date & time 03/22/13  1809 History   First MD Initiated Contact with Patient 03/22/13 1933     Chief Complaint  Patient presents with  . Headache    Patient is a 52 y.o. male presenting with headaches. The history is provided by the patient and a significant other.  Headache Pain location:  R parietal Onset quality:  Gradual Duration:  6 days Timing:  Constant Progression:  Worsening Chronicity:  New Similar to prior headaches: yes   Relieved by:  Nothing Worsened by:  Light Associated symptoms: no abdominal pain, no blurred vision, no pain, no fever, no focal weakness, no syncope, no visual change, no vomiting and no weakness   pt reports gradual onset of right sided HA for past 6 days No head trauma No fever No recent travel No focal weakness He has had this HA before   Past Medical History  Diagnosis Date  . CAD (coronary artery disease) 10/12/2011    Mild to mod dz on cath 2011;  Dr Verdis Prime  . Hypertension   . Hyperlipidemia   . HOH (hard of hearing) 07/2011    rgiht ear  . Myocardial infarction     x2; 2 years ago and last year per pt.  . Stroke    Past Surgical History  Procedure Laterality Date  . Inguinal hernia repair     Family History  Problem Relation Age of Onset  . Hypertension Mother   . Heart failure Mother   . Hypertension Father   . Colon cancer Father 58  . Colon cancer Paternal Uncle    History  Substance Use Topics  . Smoking status: Current Every Day Smoker -- 0.50 packs/day    Types: Cigarettes  . Smokeless tobacco: Not on file  . Alcohol Use: No     Comment: monthly    Review of Systems  Constitutional: Negative for fever.  Eyes: Negative for blurred vision and pain.  Cardiovascular: Negative for syncope.  Gastrointestinal: Negative for vomiting and abdominal pain.  Neurological: Positive for headaches. Negative for focal weakness.  All other systems reviewed and are negative.    Allergies   Lisinopril  Home Medications   Current Outpatient Rx  Name  Route  Sig  Dispense  Refill  . amLODipine (NORVASC) 10 MG tablet   Oral   Take 10 mg by mouth daily.         Marland Kitchen aspirin 325 MG tablet   Oral   Take 325 mg by mouth daily.         Marland Kitchen ezetimibe (ZETIA) 10 MG tablet   Oral   Take 1 tablet (10 mg total) by mouth daily.   90 tablet   3   . hydrochlorothiazide (HYDRODIURIL) 25 MG tablet   Oral   Take 1 tablet (25 mg total) by mouth daily.   30 tablet   1   . labetalol (NORMODYNE) 200 MG tablet   Oral   Take 200 mg by mouth 2 (two) times daily.         Marland Kitchen olmesartan (BENICAR) 40 MG tablet   Oral   Take 40 mg by mouth daily.         Marland Kitchen omeprazole (PRILOSEC) 40 MG capsule   Oral   Take 1 capsule (40 mg total) by mouth daily.   30 capsule   3   . PERCOCET 5-325 MG per tablet   Oral   Take 1 tablet by  mouth every 6 (six) hours as needed for pain.   15 tablet   0     Dispense as written.   . potassium chloride SA (K-DUR,KLOR-CON) 20 MEQ tablet   Oral   Take 1 tablet (20 mEq total) by mouth daily.   30 tablet   1   . ranitidine (ZANTAC) 150 MG tablet   Oral   Take 1 tablet (150 mg total) by mouth 2 (two) times daily.   30 tablet   0   . simvastatin (ZOCOR) 40 MG tablet   Oral   Take 40 mg by mouth every evening.          BP 171/100  Pulse 62  Temp(Src) 98.4 F (36.9 C) (Oral)  Resp 21  Wt 194 lb (87.998 kg)  BMI 29.5 kg/m2  SpO2 99% Physical Exam CONSTITUTIONAL: Well developed/well nourished HEAD: Normocephalic/atraumatic, no tenderness over temporal regions EYES: EOMI/PERRL, no nystagmus, no corneal hazing ENMT: Mucous membranes moist NECK: supple no meningeal signs, no bruits SPINE:entire spine nontender CV: S1/S2 noted, no murmurs/rubs/gallops noted LUNGS: Lungs are clear to auscultation bilaterally, no apparent distress ABDOMEN: soft, nontender, no rebound or guarding GU:no cva tenderness NEURO:Awake/alert, facies symmetric,  no arm or leg drift is noted Cranial nerves 3/4/5/6/12/06/08/11/12 tested and intact Gait normal without ataxia No past pointing EXTREMITIES: pulses normal, full ROM SKIN: warm, color normal PSYCH: no abnormalities of mood noted   ED Course  Procedures (including critical care time) Labs Review Labs Reviewed - No data to display Imaging Review No results found.  EKG Interpretation   None       Pt well appearing requesting to eat a snickers bar No neuro deficits Last CT head earlier this year negative, do not feel it needs repeated He has had this HA before Doubt SAH or other acute neurologic process   MDM  No diagnosis found. Nursing notes including past medical history and social history reviewed and considered in documentation     Joya Gaskins, MD 03/22/13 2008

## 2013-03-26 ENCOUNTER — Observation Stay (HOSPITAL_COMMUNITY): Payer: 59

## 2013-03-26 ENCOUNTER — Emergency Department (HOSPITAL_COMMUNITY): Payer: 59

## 2013-03-26 ENCOUNTER — Encounter (HOSPITAL_COMMUNITY): Payer: Self-pay | Admitting: Emergency Medicine

## 2013-03-26 ENCOUNTER — Inpatient Hospital Stay (HOSPITAL_COMMUNITY)
Admission: EM | Admit: 2013-03-26 | Discharge: 2013-03-27 | DRG: 103 | Disposition: A | Discharge status: Home / Self Care | Payer: 59 | Attending: Internal Medicine | Admitting: Internal Medicine

## 2013-03-26 DIAGNOSIS — G8929 Other chronic pain: Secondary | ICD-10-CM | POA: Diagnosis present

## 2013-03-26 DIAGNOSIS — G43909 Migraine, unspecified, not intractable, without status migrainosus: Secondary | ICD-10-CM | POA: Diagnosis present

## 2013-03-26 DIAGNOSIS — G459 Transient cerebral ischemic attack, unspecified: Secondary | ICD-10-CM

## 2013-03-26 DIAGNOSIS — Z9119 Patient's noncompliance with other medical treatment and regimen: Secondary | ICD-10-CM

## 2013-03-26 DIAGNOSIS — R209 Unspecified disturbances of skin sensation: Secondary | ICD-10-CM

## 2013-03-26 DIAGNOSIS — Z7982 Long term (current) use of aspirin: Secondary | ICD-10-CM

## 2013-03-26 DIAGNOSIS — I10 Essential (primary) hypertension: Secondary | ICD-10-CM | POA: Diagnosis present

## 2013-03-26 DIAGNOSIS — I2510 Atherosclerotic heart disease of native coronary artery without angina pectoris: Secondary | ICD-10-CM | POA: Diagnosis present

## 2013-03-26 DIAGNOSIS — Z79899 Other long term (current) drug therapy: Secondary | ICD-10-CM

## 2013-03-26 DIAGNOSIS — R9431 Abnormal electrocardiogram [ECG] [EKG]: Secondary | ICD-10-CM

## 2013-03-26 DIAGNOSIS — Z91148 Patient's other noncompliance with medication regimen for other reason: Secondary | ICD-10-CM

## 2013-03-26 DIAGNOSIS — R519 Headache, unspecified: Secondary | ICD-10-CM | POA: Diagnosis present

## 2013-03-26 DIAGNOSIS — R200 Anesthesia of skin: Secondary | ICD-10-CM | POA: Diagnosis present

## 2013-03-26 DIAGNOSIS — I252 Old myocardial infarction: Secondary | ICD-10-CM

## 2013-03-26 DIAGNOSIS — F17200 Nicotine dependence, unspecified, uncomplicated: Secondary | ICD-10-CM | POA: Diagnosis present

## 2013-03-26 DIAGNOSIS — Z8673 Personal history of transient ischemic attack (TIA), and cerebral infarction without residual deficits: Secondary | ICD-10-CM

## 2013-03-26 DIAGNOSIS — Z91199 Patient's noncompliance with other medical treatment and regimen due to unspecified reason: Secondary | ICD-10-CM

## 2013-03-26 DIAGNOSIS — Z9114 Patient's other noncompliance with medication regimen: Secondary | ICD-10-CM

## 2013-03-26 DIAGNOSIS — R51 Headache: Principal | ICD-10-CM | POA: Diagnosis present

## 2013-03-26 DIAGNOSIS — M549 Dorsalgia, unspecified: Secondary | ICD-10-CM | POA: Diagnosis present

## 2013-03-26 DIAGNOSIS — E785 Hyperlipidemia, unspecified: Secondary | ICD-10-CM | POA: Diagnosis present

## 2013-03-26 DIAGNOSIS — H9190 Unspecified hearing loss, unspecified ear: Secondary | ICD-10-CM | POA: Diagnosis present

## 2013-03-26 HISTORY — DX: Migraine, unspecified, not intractable, without status migrainosus: G43.909

## 2013-03-26 HISTORY — DX: Other chronic pain: G89.29

## 2013-03-26 HISTORY — DX: Dorsalgia, unspecified: M54.9

## 2013-03-26 LAB — RAPID URINE DRUG SCREEN, HOSP PERFORMED
Benzodiazepines: NOT DETECTED
Cocaine: NOT DETECTED
Opiates: NOT DETECTED
Tetrahydrocannabinol: NOT DETECTED

## 2013-03-26 LAB — COMPREHENSIVE METABOLIC PANEL
AST: 17 U/L (ref 0–37)
Albumin: 4 g/dL (ref 3.5–5.2)
BUN: 14 mg/dL (ref 6–23)
CO2: 24 mEq/L (ref 19–32)
Calcium: 9.2 mg/dL (ref 8.4–10.5)
Chloride: 106 mEq/L (ref 96–112)
Creatinine, Ser: 1.21 mg/dL (ref 0.50–1.35)
GFR calc Af Amer: 79 mL/min — ABNORMAL LOW (ref >90)
GFR calc non Af Amer: 68 mL/min — ABNORMAL LOW (ref >90)
Total Bilirubin: 0.7 mg/dL (ref 0.3–1.2)
Total Protein: 7.3 g/dL (ref 6.0–8.3)

## 2013-03-26 LAB — POCT I-STAT, CHEM 8
BUN: 14 mg/dL (ref 6–23)
Creatinine, Ser: 1.4 mg/dL — ABNORMAL HIGH (ref 0.50–1.35)
Potassium: 3.7 mEq/L (ref 3.5–5.1)
Sodium: 143 mEq/L (ref 135–145)

## 2013-03-26 LAB — CBC
HCT: 37.7 % — ABNORMAL LOW (ref 39.0–52.0)
HCT: 37.7 % — ABNORMAL LOW (ref 39.0–52.0)
Hemoglobin: 13.1 g/dL (ref 13.0–17.0)
Hemoglobin: 13.1 g/dL (ref 13.0–17.0)
MCH: 30.2 pg (ref 26.0–34.0)
MCHC: 34.7 g/dL (ref 30.0–36.0)
MCHC: 34.7 g/dL (ref 30.0–36.0)
MCV: 86.9 fL (ref 78.0–100.0)
RBC: 4.34 MIL/uL (ref 4.22–5.81)
RBC: 4.35 MIL/uL (ref 4.22–5.81)
RDW: 11.7 % (ref 11.5–15.5)

## 2013-03-26 LAB — URINALYSIS, ROUTINE W REFLEX MICROSCOPIC
Bilirubin Urine: NEGATIVE
Glucose, UA: NEGATIVE mg/dL
Hgb urine dipstick: NEGATIVE
Nitrite: NEGATIVE
Protein, ur: NEGATIVE mg/dL
Specific Gravity, Urine: 1.016 (ref 1.005–1.030)
pH: 6 (ref 5.0–8.0)

## 2013-03-26 LAB — PROTIME-INR: INR: 1.06 (ref 0.00–1.49)

## 2013-03-26 LAB — BASIC METABOLIC PANEL
BUN: 12 mg/dL (ref 6–23)
CO2: 22 mEq/L (ref 19–32)
Chloride: 104 mEq/L (ref 96–112)
Creatinine, Ser: 1.16 mg/dL (ref 0.50–1.35)
GFR calc Af Amer: 83 mL/min — ABNORMAL LOW (ref >90)
Glucose, Bld: 118 mg/dL — ABNORMAL HIGH (ref 70–99)
Potassium: 3.8 mEq/L (ref 3.5–5.1)

## 2013-03-26 LAB — GLUCOSE, CAPILLARY: Glucose-Capillary: 83 mg/dL (ref 70–99)

## 2013-03-26 LAB — POCT I-STAT TROPONIN I: Troponin i, poc: 0.01 ng/mL (ref 0.00–0.08)

## 2013-03-26 LAB — DIFFERENTIAL
Basophils Relative: 1 % (ref 0–1)
Eosinophils Relative: 3 % (ref 0–5)
Monocytes Absolute: 0.3 10*3/uL (ref 0.1–1.0)
Monocytes Relative: 5 % (ref 3–12)
Neutro Abs: 2.2 10*3/uL (ref 1.7–7.7)

## 2013-03-26 LAB — TROPONIN I: Troponin I: <0.3 ng/mL (ref <0.30)

## 2013-03-26 LAB — APTT: aPTT: 30 seconds (ref 24–37)

## 2013-03-26 MED ORDER — IRBESARTAN 300 MG PO TABS
300.0000 mg | ORAL_TABLET | Freq: Every day | ORAL | Status: DC
  Administered 2013-03-26 – 2013-03-27 (×2): 300 mg via ORAL
  Filled 2013-03-26 (×2): qty 1

## 2013-03-26 MED ORDER — DIPHENHYDRAMINE HCL 50 MG/ML IJ SOLN
50.0000 mg | Freq: Once | INTRAMUSCULAR | Status: AC
  Administered 2013-03-26: 50 mg via INTRAVENOUS
  Filled 2013-03-26: qty 1

## 2013-03-26 MED ORDER — ASPIRIN 300 MG RE SUPP
300.0000 mg | Freq: Every day | RECTAL | Status: DC
  Filled 2013-03-26 (×2): qty 1

## 2013-03-26 MED ORDER — AMLODIPINE BESYLATE 10 MG PO TABS
10.0000 mg | ORAL_TABLET | Freq: Every day | ORAL | Status: DC
  Filled 2013-03-26: qty 1

## 2013-03-26 MED ORDER — DEXAMETHASONE SODIUM PHOSPHATE 4 MG/ML IJ SOLN: 4.0000 mg | Freq: Two times a day (BID) | INTRAMUSCULAR | Status: DC

## 2013-03-26 MED ORDER — LABETALOL HCL 300 MG PO TABS
300.0000 mg | ORAL_TABLET | Freq: Two times a day (BID) | ORAL | Status: DC
  Administered 2013-03-26 – 2013-03-27 (×2): 300 mg via ORAL
  Filled 2013-03-26 (×3): qty 1

## 2013-03-26 MED ORDER — VALPROATE SODIUM 500 MG/5ML IV SOLN
500.0000 mg | Freq: Three times a day (TID) | INTRAVENOUS | Status: DC
  Administered 2013-03-26: 500 mg via INTRAVENOUS
  Filled 2013-03-26 (×2): qty 5

## 2013-03-26 MED ORDER — HYDROCHLOROTHIAZIDE 25 MG PO TABS
25.0000 mg | ORAL_TABLET | Freq: Every day | ORAL | Status: DC
  Administered 2013-03-26 – 2013-03-27 (×2): 25 mg via ORAL
  Filled 2013-03-26 (×2): qty 1

## 2013-03-26 MED ORDER — ASPIRIN 325 MG PO TABS
325.0000 mg | ORAL_TABLET | Freq: Every day | ORAL | Status: DC
  Administered 2013-03-26 – 2013-03-27 (×2): 325 mg via ORAL
  Filled 2013-03-26: qty 1

## 2013-03-26 MED ORDER — MORPHINE SULFATE 2 MG/ML IJ SOLN
2.0000 mg | Freq: Once | INTRAMUSCULAR | Status: AC
  Administered 2013-03-26: 2 mg via INTRAVENOUS

## 2013-03-26 MED ORDER — MORPHINE SULFATE 2 MG/ML IJ SOLN
INTRAMUSCULAR | Status: AC
  Administered 2013-03-26: 2 mg via INTRAVENOUS
  Filled 2013-03-26: qty 1

## 2013-03-26 MED ORDER — LABETALOL HCL 200 MG PO TABS
200.0000 mg | ORAL_TABLET | Freq: Two times a day (BID) | ORAL | Status: DC
  Administered 2013-03-26: 200 mg via ORAL
  Filled 2013-03-26 (×3): qty 1

## 2013-03-26 MED ORDER — PROMETHAZINE HCL 25 MG/ML IJ SOLN
25.0000 mg | Freq: Three times a day (TID) | INTRAMUSCULAR | Status: DC | PRN
  Filled 2013-03-26: qty 1

## 2013-03-26 MED ORDER — ACETAMINOPHEN 325 MG PO TABS
650.0000 mg | ORAL_TABLET | Freq: Four times a day (QID) | ORAL | Status: DC | PRN
  Administered 2013-03-26 – 2013-03-27 (×2): 650 mg via ORAL
  Filled 2013-03-26 (×2): qty 2

## 2013-03-26 MED ORDER — HYDRALAZINE HCL 20 MG/ML IJ SOLN: 10.0000 mg | INTRAMUSCULAR | Status: DC | PRN

## 2013-03-26 MED ORDER — SODIUM CHLORIDE 0.9 % IV SOLN
INTRAVENOUS | Status: DC
  Administered 2013-03-26: 01:00:00 via INTRAVENOUS

## 2013-03-26 MED ORDER — ASPIRIN 325 MG PO TABS
325.0000 mg | ORAL_TABLET | Freq: Every day | ORAL | Status: DC
  Filled 2013-03-26: qty 1

## 2013-03-26 MED ORDER — PNEUMOCOCCAL VAC POLYVALENT 25 MCG/0.5ML IJ INJ
0.5000 mL | INJECTION | INTRAMUSCULAR | Status: DC
  Filled 2013-03-26: qty 0.5

## 2013-03-26 MED ORDER — VALPROATE SODIUM 500 MG/5ML IV SOLN
250.0000 mg | Freq: Once | INTRAVENOUS | Status: AC
  Administered 2013-03-26: 250 mg via INTRAVENOUS
  Filled 2013-03-26: qty 2.5

## 2013-03-26 MED ORDER — SIMVASTATIN 40 MG PO TABS
40.0000 mg | ORAL_TABLET | Freq: Every evening | ORAL | Status: DC
  Filled 2013-03-26: qty 1

## 2013-03-26 MED ORDER — ATORVASTATIN CALCIUM 20 MG PO TABS
20.0000 mg | ORAL_TABLET | Freq: Every day | ORAL | Status: DC
  Administered 2013-03-26: 20 mg via ORAL
  Filled 2013-03-26 (×2): qty 1

## 2013-03-26 MED ORDER — AMLODIPINE BESYLATE 10 MG PO TABS
10.0000 mg | ORAL_TABLET | Freq: Every day | ORAL | Status: DC
  Administered 2013-03-26 – 2013-03-27 (×2): 10 mg via ORAL
  Filled 2013-03-26 (×2): qty 1

## 2013-03-26 MED ORDER — DEXAMETHASONE SODIUM PHOSPHATE 10 MG/ML IJ SOLN
10.0000 mg | Freq: Once | INTRAMUSCULAR | Status: AC
  Administered 2013-03-26: 10 mg via INTRAVENOUS
  Filled 2013-03-26: qty 1

## 2013-03-26 MED ORDER — SODIUM CHLORIDE 0.9 % IV SOLN
INTRAVENOUS | Status: DC
  Administered 2013-03-26 (×2): via INTRAVENOUS

## 2013-03-26 MED ORDER — HYDRALAZINE HCL 20 MG/ML IJ SOLN
10.0000 mg | Freq: Once | INTRAMUSCULAR | Status: AC
  Administered 2013-03-26: 03:00:00 via INTRAVENOUS
  Filled 2013-03-26: qty 1

## 2013-03-26 MED ORDER — METOCLOPRAMIDE HCL 5 MG/ML IJ SOLN
10.0000 mg | Freq: Once | INTRAMUSCULAR | Status: AC
  Administered 2013-03-26: 10 mg via INTRAVENOUS
  Filled 2013-03-26: qty 2

## 2013-03-26 MED ORDER — SODIUM CHLORIDE 0.9 % IJ SOLN
3.0000 mL | Freq: Two times a day (BID) | INTRAMUSCULAR | Status: DC
  Administered 2013-03-26 (×2): 3 mL via INTRAVENOUS

## 2013-03-26 MED ORDER — VALPROATE SODIUM 500 MG/5ML IV SOLN
500.0000 mg | Freq: Once | INTRAVENOUS | Status: AC
  Administered 2013-03-26: 500 mg via INTRAVENOUS
  Filled 2013-03-26: qty 5

## 2013-03-26 MED ORDER — SODIUM CHLORIDE 0.9 % IV SOLN
INTRAVENOUS | Status: AC
  Administered 2013-03-26: 03:00:00 via INTRAVENOUS

## 2013-03-26 NOTE — Evaluation (Signed)
Clinical/Bedside Swallow Evaluation Patient Details  Name: [name] MRN: [id number] Date of Birth: 1961-03-13  Today's Date: 03/26/2013 Time: 1014-1029 SLP Time Calculation (min): 15 min  Past Medical History:  Past Medical History  Diagnosis Date  . CAD (coronary artery disease) 10/12/2011    Mild to mod dz on cath 2011;  Dr Verdis Prime  . Hypertension   . Hyperlipidemia   . HOH (hard of hearing) 07/2011    rgiht ear  . Myocardial infarction     x2; 2 years ago and last year per pt.  . Stroke   . Migraine headache   . Chronic back pain    Past Surgical History:  Past Surgical History  Procedure Laterality Date  . Inguinal hernia repair     HPI:  52 yo male with migraine, left sided numbness, medical noncompliance with chronically uncontrolled htn. CT of the head negative for acute infarct. MRI pending. RN stroke swallow screen not passed due to inability to lick top and bottom lip.    Assessment / Plan / Recommendation Clinical Impression  Patient presents with a functional oropharyngeal swallow without overt indication of aspiration. Very mild left sided lingual weakness and left sided facial numbness persists but is not impacting overall function at this time. Suspect rapid improvement based on rapidly improving symptoms overall. No further SLP needs indicated at this time. Education complete with patient and spouse regarding general safe swallowing precautions, s/s of aspiration, and need for f/u SLP services if weakness persists or worsens impacting swallowing and/or speech.     Aspiration Risk  Mild    Diet Recommendation Regular;Thin liquid   Liquid Administration via: Cup;Straw Medication Administration: Whole meds with liquid Supervision: Patient able to self feed Compensations: Slow rate;Small sips/bites Postural Changes and/or Swallow Maneuvers: Seated upright 90 degrees    Other  Recommendations Oral Care Recommendations: Oral care BID   Follow Up  Recommendations  None       Pertinent Vitals/Pain none     Swallow Study    General HPI: 52 yo male with migraine, left sided numbness, medical noncompliance with chronically uncontrolled htn. CT of the head negative for acute infarct. MRI pending. RN stroke swallow screen not passed due to inability to lick top and bottom lip.  Type of Study: Bedside swallow evaluation Previous Swallow Assessment: none Diet Prior to this Study: NPO Temperature Spikes Noted: No Respiratory Status: Nasal cannula History of Recent Intubation: No Behavior/Cognition: Alert;Cooperative;Pleasant mood Oral Cavity - Dentition: Adequate natural dentition Self-Feeding Abilities: Able to feed self Patient Positioning: Upright in bed Baseline Vocal Quality: Hoarse;Low vocal intensity (improved with pos) Volitional Cough: Strong Volitional Swallow: Able to elicit    Oral/Motor/Sensory Function Overall Oral Motor/Sensory Function: Impaired Labial ROM: Within Functional Limits Labial Symmetry: Within Functional Limits Labial Strength: Within Functional Limits Labial Sensation: Within Functional Limits Lingual ROM: Reduced left Lingual Symmetry: Within Functional Limits Lingual Strength: Reduced Lingual Sensation: Within Functional Limits Facial ROM: Within Functional Limits Facial Symmetry: Within Functional Limits Facial Strength: Within Functional Limits Facial Sensation: Reduced Velum: Within Functional Limits Mandible: Within Functional Limits   Ice Chips Ice chips: Not tested   Thin Liquid Thin Liquid: Within functional limits Presentation: Cup;Self Fed;Straw    Nectar Thick Nectar Thick Liquid: Not tested   Honey Thick Honey Thick Liquid: Not tested   Puree Puree: Within functional limits Presentation: Spoon;Self Fed   Solid   GO Functional Assessment Tool Used: skilled clinical judgement Functional Limitations: Swallowing Swallow Current Status [phone]): At  least 1 percent but less than  20 percent impaired, limited or restricted Swallow Goal Status ([id number]): At least 1 percent but less than 20 percent impaired, limited or restricted Swallow Discharge Status [phone]): At least 1 percent but less than 20 percent impaired, limited or restricted  Solid: Within functional limits Presentation: Self Fed;Spoon      Ferdinand Lango MA, CCC-SLP [phone]  Shatha Hooser Meryl 03/26/2013,11:29 AM

## 2013-03-26 NOTE — ED Notes (Signed)
Dr. Onalee Hua- admitting physician at bedside.

## 2013-03-26 NOTE — Progress Notes (Signed)
UR complete.  Astella Desir RN, MSN 

## 2013-03-26 NOTE — ED Provider Notes (Signed)
CSN: [id number]     Arrival date & time 03/26/13  0022 History   First MD Initiated Contact with Patient 03/26/13 [phone]     Chief Complaint  Patient presents with  . Code Stroke    HPI Pt was seen at 0030. Per EMS and pt's wife, c/o gradual onset and persistence of constant left sided headache for the past 1 week. Pt was seen in the ED several days ago for same, tx with reglan and benadryl with transient relief. Pt's wife states she found pt in the bathroom "laying over the sink to the right with his tongue sticking out to the left" PTA. She and another family member were able to assist pt into their car, but states pt would not walk on his LLE and kept "rubbing it." Upon EMS arrival to scene, pt would not speak with them, had left sided facial droop, LUE and LLE weakness and numbness. EMS called Code Stroke en route to ED. No reported LOC, no N/V/D, no fevers, no injury. Denies headache was sudden or maximal at onset or at any time.    Past Medical History  Diagnosis Date  . CAD (coronary artery disease) 10/12/2011    Mild to mod dz on cath 2011;  Dr Verdis Prime  . Hypertension   . Hyperlipidemia   . HOH (hard of hearing) 07/2011    rgiht ear  . Myocardial infarction     x2; 2 years ago and last year per pt.  . Stroke   . Migraine headache   . Chronic back pain    Past Surgical History  Procedure Laterality Date  . Inguinal hernia repair     Family History  Problem Relation Age of Onset  . Hypertension Mother   . Heart failure Mother   . Hypertension Father   . Colon cancer Father 61  . Colon cancer Paternal Uncle    History  Substance Use Topics  . Smoking status: Current Every Day Smoker -- 0.50 packs/day    Types: Cigarettes  . Smokeless tobacco: Not on file  . Alcohol Use: No     Comment: monthly    Review of Systems ROS: Statement: All systems negative except as marked or noted in the HPI; Constitutional: Negative for fever and chills. ; ; Eyes: Negative for eye  pain, redness and discharge. ; ; ENMT: Negative for ear pain, hoarseness, nasal congestion, sinus pressure and sore throat. ; ; Cardiovascular: Negative for chest pain, palpitations, diaphoresis, dyspnea and peripheral edema. ; ; Respiratory: Negative for cough, wheezing and stridor. ; ; Gastrointestinal: Negative for nausea, vomiting, diarrhea, abdominal pain, blood in stool, hematemesis, jaundice and rectal bleeding. . ; ; Genitourinary: Negative for dysuria, flank pain and hematuria. ; ; Musculoskeletal: Negative for back pain and neck pain. Negative for swelling and trauma.; ; Skin: Negative for pruritus, rash, abrasions, blisters, bruising and skin lesion.; ; Neuro: +headache, left sided numbness/weakness, left sided facial droop. Negative for lightheadedness and neck stiffness. Negative for altered level of consciousness , altered mental status, involuntary movement, seizure and syncope.       Allergies  Lisinopril  Home Medications   Current Outpatient Rx  Name  Route  Sig  Dispense  Refill  . amLODipine (NORVASC) 10 MG tablet   Oral   Take 10 mg by mouth daily.         Marland Kitchen aspirin 325 MG tablet   Oral   Take 325 mg by mouth daily.         Marland Kitchen  Aspirin-Acetaminophen-Caffeine (GOODY HEADACHE PO)   Oral   Take 1 packet by mouth every 4 (four) hours as needed (headache).         . hydrochlorothiazide (HYDRODIURIL) 25 MG tablet   Oral   Take 1 tablet (25 mg total) by mouth daily.   30 tablet   1   . labetalol (NORMODYNE) 200 MG tablet   Oral   Take 200 mg by mouth 2 (two) times daily.         Marland Kitchen olmesartan (BENICAR) 40 MG tablet   Oral   Take 40 mg by mouth daily.         Marland Kitchen omeprazole (PRILOSEC) 40 MG capsule   Oral   Take 1 capsule (40 mg total) by mouth daily.   30 capsule   3   . simvastatin (ZOCOR) 40 MG tablet   Oral   Take 40 mg by mouth every evening.          BP 209/105  Pulse 52  Temp(Src) 98.5 F (36.9 C) (Oral)  Resp 17  Ht 5\' 8"  (1.727 m)   Wt 194 lb (87.998 kg)  BMI 29.5 kg/m2  SpO2 100%  03/26/13 0048 03/26/13 0100  BP: 209/105 184/91  Pulse: 52 43  Temp: 98.5 F (36.9 C)   TempSrc: Oral   Resp: 17 11  Height: 5\' 8"  (1.727 m)   Weight: 194 lb (87.998 kg)   SpO2: 100% 100%    Physical Exam 0035: Physical examination:  Nursing notes reviewed; Vital signs and O2 SAT reviewed;  Constitutional: Well developed, Well nourished, Well hydrated, Uncomfortable appearing; Head:  Normocephalic, atraumatic; Eyes: EOMI, PERRL, No scleral icterus; ENMT: Mouth and pharynx normal, Mucous membranes moist; Neck: Supple, Full range of motion, No lymphadenopathy; Cardiovascular: Bradycardic rate and rhythm, No gallop; Respiratory: Breath sounds clear & equal bilaterally, No rales, rhonchi, wheezes.  Speaking full sentences with ease, Normal respiratory effort/excursion; Chest: Nontender, Movement normal; Abdomen: Soft, Nontender, Nondistended, Normal bowel sounds;; Extremities: Pulses normal, No tenderness, No edema, No calf edema or asymmetry.; Neuro: AA&Ox3, Major CN grossly intact. No facial droop. Speech clear. Moves all ext on stretcher spontaneously, pushes himself up on the bed and sits up without assist. +subjective decreased sensation left side.; Skin: Color normal, Warm, Dry.    ED Course  Procedures   [phone]:  Stroke Team at bedside on pt's arrival to ED. Pt's wife states "he just doesn't like to talk when he's in pain." Pt strongly encouraged to speak by Neuro and ED staff; pt's speech clear and appropriate. NIH score 3 due to subjective left sided sensory deficits compared to right. Other symptoms seen by EMS improved on pt's arrival to ED. Neuro Dr. Leroy Kennedy cancelled Code Stroke, requests to tx for migraine headache, admit to medicine service for further TIA/stroke workup.    0100:  Pt sleeping after benadryl, reglan, decadron. Pt's wife states he feels "better." MAP dropped by 15% after tx pt for pain. EKG abnl, but unchanged from  previous. Dx and testing d/w pt and family.  Questions answered.  Verb understanding, agreeable to admit.  T/C to [facility] Dr. Onalee Hua, case discussed, including:  HPI, pertinent PM/SHx, VS/PE, dx testing, ED course and treatment:  Agreeable to admit, requests to write temporary orders, obtain tele bed to team 10.     EKG Interpretation     Ventricular Rate:  58 PR Interval:  183 QRS Duration: 84 QT Interval:  440 QTC Calculation: 432 R Axis:   70 Text  Interpretation:  Sinus rhythm Artifact Consider left ventricular hypertrophy Abnormal T, consider ischemia, diffuse leads            MDM  MDM Reviewed: previous chart, nursing note and vitals Reviewed previous: labs and ECG Interpretation: labs, ECG and CT scan Consults: neurology and admitting MD     Results for orders placed during the hospital encounter of 03/26/13  ETHANOL      Result Value Range   Alcohol, Ethyl (B) <11  0 - 11 mg/dL  PROTIME-INR      Result Value Range   Prothrombin Time 13.6  11.6 - 15.2 seconds   INR 1.06  0.00 - 1.49  APTT      Result Value Range   aPTT 30  24 - 37 seconds  CBC      Result Value Range   WBC 5.8  4.0 - 10.5 K/uL   RBC 4.35  4.22 - 5.81 MIL/uL   Hemoglobin 13.1  13.0 - 17.0 g/dL   HCT 47.8 (*) 29.5 - 62.1 %   MCV 86.7  78.0 - 100.0 fL   MCH 30.1  26.0 - 34.0 pg   MCHC 34.7  30.0 - 36.0 g/dL   RDW 30.8  65.7 - 84.6 %   Platelets 233  150 - 400 K/uL  DIFFERENTIAL      Result Value Range   Neutrophils Relative % 38 (*) 43 - 77 %   Neutro Abs 2.2  1.7 - 7.7 K/uL   Lymphocytes Relative 53 (*) 12 - 46 %   Lymphs Abs 3.0  0.7 - 4.0 K/uL   Monocytes Relative 5  3 - 12 %   Monocytes Absolute 0.3  0.1 - 1.0 K/uL   Eosinophils Relative 3  0 - 5 %   Eosinophils Absolute 0.2  0.0 - 0.7 K/uL   Basophils Relative 1  0 - 1 %   Basophils Absolute 0.0  0.0 - 0.1 K/uL  COMPREHENSIVE METABOLIC PANEL      Result Value Range   Sodium 141  135 - 145 mEq/L   Potassium 3.8  3.5 - 5.1 mEq/L    Chloride 106  96 - 112 mEq/L   CO2 24  19 - 32 mEq/L   Glucose, Bld 90  70 - 99 mg/dL   BUN 14  6 - 23 mg/dL   Creatinine, Ser 9.62  0.50 - 1.35 mg/dL   Calcium 9.2  8.4 - 95.2 mg/dL   Total Protein 7.3  6.0 - 8.3 g/dL   Albumin 4.0  3.5 - 5.2 g/dL   AST 17  0 - 37 U/L   ALT 9  0 - 53 U/L   Alkaline Phosphatase 74  39 - 117 U/L   Total Bilirubin 0.7  0.3 - 1.2 mg/dL   GFR calc non Af Amer 68 (*) >90 mL/min   GFR calc Af Amer 79 (*) >90 mL/min  TROPONIN I      Result Value Range   Troponin I <0.30  <0.30 ng/mL  URINALYSIS, ROUTINE W REFLEX MICROSCOPIC      Result Value Range   Color, Urine YELLOW  YELLOW   APPearance CLEAR  CLEAR   Specific Gravity, Urine 1.016  1.005 - 1.030   pH 6.0  5.0 - 8.0   Glucose, UA NEGATIVE  NEGATIVE mg/dL   Hgb urine dipstick NEGATIVE  NEGATIVE   Bilirubin Urine NEGATIVE  NEGATIVE   Ketones, ur NEGATIVE  NEGATIVE mg/dL   Protein, ur  NEGATIVE  NEGATIVE mg/dL   Urobilinogen, UA 0.2  0.0 - 1.0 mg/dL   Nitrite NEGATIVE  NEGATIVE   Leukocytes, UA NEGATIVE  NEGATIVE  POCT I-STAT, CHEM 8      Result Value Range   Sodium 143  135 - 145 mEq/L   Potassium 3.7  3.5 - 5.1 mEq/L   Chloride 105  96 - 112 mEq/L   BUN 14  6 - 23 mg/dL   Creatinine, Ser 1.61 (*) 0.50 - 1.35 mg/dL   Glucose, Bld 90  70 - 99 mg/dL   Calcium, Ion 0.96  0.45 - 1.23 mmol/L   TCO2 22  0 - 100 mmol/L   Hemoglobin 14.3  13.0 - 17.0 g/dL   HCT 40.9  81.1 - 91.4 %  POCT I-STAT TROPONIN I      Result Value Range   Troponin i, poc 0.01  0.00 - 0.08 ng/mL   Comment 3            Ct Head Wo Contrast 03/26/2013   CLINICAL DATA:  Code stroke. Headache and slow response  EXAM: CT HEAD WITHOUT CONTRAST  TECHNIQUE: Contiguous axial images were obtained from the base of the skull through the vertex without intravenous contrast.  COMPARISON:  07/17/2012  FINDINGS: Skull and Sinuses:No significant abnormality.  Orbits: No acute abnormality.  Brain: No evidence of acute abnormality, such as  acute large vascular territory infarction, hemorrhage, hydrocephalus, or mass lesion/mass effect. Extensive patchy bilateral cerebral white matter low density, predominantly subcortical, similar in pattern and severity.  These results were called by telephone at the time of interpretation on 03/26/2013 at 12:38 AM to code stroke attending, who verbally acknowledged these results.  IMPRESSION: 1. No evidence of acute hemorrhage or large vessel infarct. 2. Extensive white matter disease which appears stable from priors. Although nonspecific, history of hypertension and hyperlipidemia favors chronic small vessel ischemia.   Electronically Signed   By: Tiburcio Pea M.D.   On: 03/26/2013 00:39      Laray Anger, DO 03/28/13 2143

## 2013-03-26 NOTE — ED Notes (Signed)
Patient presents to ED via [facility]. Pt was seen and treated a few days ago for a "migraine." Per pt family pt stated that he had a "severe headache" today. Pt wife states that patient was last seen well at approx 2230. Pt wife states that the patient was in the bathroom-laying over to the right when she found him. Per EMS patient had left sided weakness/drift. Pt was not speaking and facial droop on left upon arrival to ED. Respirations even and unlabored. HR 56. O2 100% on 2 L. BP 213/109.

## 2013-03-26 NOTE — Consult Note (Signed)
Referring Physician: ED    Chief Complaint: CODE STROKE: LEFT SIDED WEAKNESS, NUMBNESS, DECREASED RESPONSIVENESS, HA.  HPI:                                                                                                                                         [name] is an 52 y.o. male, right handed, with a past medial history significant for HTN, hyperlipidemia, CAD s/p MI, stroke without residual deficits, episodic migraine, brought to [facility] ED by medics as a code stroke due to acute onset of the above stated symptoms. Wife stated that she last saw him well around 1030 tonight, when she found him in a couch leaning to the right and with the tongue deviated to the left, pointing to his legs. He was not fully responsive and thus EMS was called. Upon EMS arrival he was noted to have left sided weakness and he complained of numbness left side. EMS said that he was not taking and had a SBP>200. On further questioning, his wife expressed that he has been having a migraine for a couple of days and was actually seen in the ED 2 days ago and received a HA cocktail that stopped the HA and then it came back again.  NIHSS 3. CT brain showed no acute intracranial abnormality.   Date last known well: 03/25/13 Time last known well: 1030 pm  tPA Given: no, NIHSS 3 NIHSS: 3 MRS:0   Past Medical History  Diagnosis Date  . CAD (coronary artery disease) 10/12/2011    Mild to mod dz on cath 2011;  Dr Verdis Prime  . Hypertension   . Hyperlipidemia   . HOH (hard of hearing) 07/2011    rgiht ear  . Myocardial infarction     x2; 2 years ago and last year per pt.  . Stroke   . Migraine headache   . Chronic back pain     Past Surgical History  Procedure Laterality Date  . Inguinal hernia repair      Family History  Problem Relation Age of Onset  . Hypertension Mother   . Heart failure Mother   . Hypertension Father   . Colon cancer Father 54  . Colon cancer Paternal Uncle    Social History:   reports that he has been smoking Cigarettes.  He has been smoking about 0.50 packs per day. He does not have any smokeless tobacco history on file. He reports that he does not drink alcohol or use illicit drugs.  Allergies:  Allergies  Allergen Reactions  . Lisinopril Swelling    Medications:  I have reviewed the patient's current medications.  ROS:                                                                                                                                       History obtained from chart review and wife.  General ROS: negative for - chills, fatigue, fever, night sweats, weight gain or weight loss Psychological ROS: negative for - behavioral disorder, hallucinations, memory difficulties, mood swings or suicidal ideation Ophthalmic ROS: negative for - blurry vision, double vision, eye pain or loss of vision ENT ROS: negative for - epistaxis, nasal discharge, oral lesions, sore throat, tinnitus or vertigo Allergy and Immunology ROS: negative for - hives or itchy/watery eyes Hematological and Lymphatic ROS: negative for - bleeding problems, bruising or swollen lymph nodes Endocrine ROS: negative for - galactorrhea, hair pattern changes, polydipsia/polyuria or temperature intolerance Respiratory ROS: negative for - cough, hemoptysis, shortness of breath or wheezing Cardiovascular ROS: negative for - chest pain, dyspnea on exertion, edema or irregular heartbeat Gastrointestinal ROS: negative for - abdominal pain, diarrhea, hematemesis, nausea/vomiting or stool incontinence Genito-Urinary ROS: negative for - dysuria, hematuria, incontinence or urinary frequency/urgency Musculoskeletal ROS: negative for - joint swelling Neurological ROS: as noted in HPI Dermatological ROS: negative for rash and skin lesion changes   Physical exam: pleasant male in some  distress due to HA. BP 200/90 P 86 R 17 Afebrile Head: normocephalic. Neck: supple, no bruits, no JVD. Cardiac: no murmurs. Lungs: clear. Abdomen: soft, no tender, no mass. Extremities: no edema.  Neurologic Examination:                                                                                                      Mental Status: Alert, oriented, thought content appropriate.  Speech fluent without evidence of aphasia.  Able to follow 3 step commands without difficulty. Cranial Nerves: II: Discs flat bilaterally; Visual fields grossly normal, pupils equal, round, reactive to light and accommodation III,IV, VI: ptosis not present, extra-ocular motions intact bilaterally V,VII: smile symmetric, facial light touch sensation normal bilaterally VIII: hearing normal bilaterally IX,X: gag reflex present XI: bilateral shoulder shrug XII: midline tongue extension without atrophy or fasciculations  Motor: Right : Upper extremity   5/5    Left:     Upper extremity   5/5  Lower extremity   5/5     Lower extremity   5/5 Tone and bulk:normal tone throughout; no atrophy noted Sensory: Pinprick and light touch diminished in the right  Deep Tendon Reflexes:  Right: Upper Extremity   Left: Upper extremity   biceps (C-5 to C-6) 2/4   biceps (C-5 to C-6) 2/4 tricep (C7) 2/4    triceps (C7) 2/4 Brachioradialis (C6) 2/4  Brachioradialis (C6) 2/4  Lower Extremity Lower Extremity  quadriceps (L-2 to L-4) 2/4   quadriceps (L-2 to L-4) 2/4 Achilles (S1) 2/4   Achilles (S1) 2/4  Plantars: Right: downgoing   Left: downgoing Cerebellar: normal finger-to-nose,  normal heel-to-shin test Gait: No ataxia. CV: pulses palpable throughout     Results for orders placed during the hospital encounter of 03/26/13 (from the past 48 hour(s))  POCT I-STAT, CHEM 8     Status: Abnormal   Collection Time    03/26/13 12:30 AM      Result Value Range   Sodium 143  135 - 145 mEq/L   Potassium 3.7  3.5 - 5.1  mEq/L   Chloride 105  96 - 112 mEq/L   BUN 14  6 - 23 mg/dL   Creatinine, Ser 2.95 (*) 0.50 - 1.35 mg/dL   Glucose, Bld 90  70 - 99 mg/dL   Calcium, Ion 6.21  3.08 - 1.23 mmol/L   TCO2 22  0 - 100 mmol/L   Hemoglobin 14.3  13.0 - 17.0 g/dL   HCT 65.7  84.6 - 96.2 %   Ct Head Wo Contrast  03/26/2013   CLINICAL DATA:  Code stroke. Headache and slow response  EXAM: CT HEAD WITHOUT CONTRAST  TECHNIQUE: Contiguous axial images were obtained from the base of the skull through the vertex without intravenous contrast.  COMPARISON:  07/17/2012  FINDINGS: Skull and Sinuses:No significant abnormality.  Orbits: No acute abnormality.  Brain: No evidence of acute abnormality, such as acute large vascular territory infarction, hemorrhage, hydrocephalus, or mass lesion/mass effect. Extensive patchy bilateral cerebral white matter low density, predominantly subcortical, similar in pattern and severity.  These results were called by telephone at the time of interpretation on 03/26/2013 at 12:38 AM to code stroke attending, who verbally acknowledged these results.  IMPRESSION: 1. No evidence of acute hemorrhage or large vessel infarct. 2. Extensive white matter disease which appears stable from priors. Although nonspecific, history of hypertension and hyperlipidemia favors chronic small vessel ischemia.   Electronically Signed   By: Tiburcio Pea M.D.   On: 03/26/2013 00:39      Assessment: 52 y.o. male with acute onset left sided weakness-numbness, trouble speaking (wife said that he prefers not to talk when he is having a migraine attack) in he context of HA for couple of days. NIHSS 3. CT brain unremarkable for acute abnormality.  He in within the window for IV thrombolysis but NIHSS is 3 and can not entirely exclude the possibility of a complicated migraine. Will recommend admission to medicine, treatment of migraine with IV Depacon, IV decadron, and phenergan. MRI brain and then decide about further  neuro-imaging depending on test results.  Stroke Risk Factors - HTN, CAD.   Wyatt Portela, MD [facility] [phone]  03/26/2013, 12:46 AM

## 2013-03-26 NOTE — H&P (Signed)
PCP:   [name] Linger, English   Chief Complaint:  Migraine, left leg numbness  HPI: 52 yo male with htn, hld, migraine headaches has been having a migraine for about one week now.  His wife says he does not take his bp meds on a regular basis.  And his headaches do get worse when he does not take his meds.  He ran out of his norvasc days ago.  He started having some left leg numbness today.  No fevers.  No n/v/d.  No blurred vision.  He would not talk to his wife earlier, but can speak normally to me.  Thought initially he had aphasia.  He responds appropriately to me.  His headache is some better with treatment in the ED, his bp is also improved initially was 230 now down in the 180s.  No cp.  No sob.  Review of Systems:  Positive and negative as per HPI otherwise all other systems are negative  Past Medical History: Past Medical History  Diagnosis Date  . CAD (coronary artery disease) 10/12/2011    Mild to mod dz on cath 2011;  [name]  . Hypertension   . Hyperlipidemia   . HOH (hard of hearing) 07/2011    rgiht ear  . Myocardial infarction     x2; 2 years ago and last year per pt.  . Stroke   . Migraine headache   . Chronic back pain    Past Surgical History  Procedure Laterality Date  . Inguinal hernia repair      Medications: Prior to Admission medications   Medication Sig Start Date End Date Taking? Authorizing Provider  amLODipine (NORVASC) 10 MG tablet Take 10 mg by mouth daily.   Yes Historical Provider, English  aspirin 325 MG tablet Take 325 mg by mouth daily.   Yes Historical Provider, English  Aspirin-Acetaminophen-Caffeine (GOODY HEADACHE PO) Take 1 packet by mouth every 4 (four) hours as needed (headache).   Yes Historical Provider, English  hydrochlorothiazide (HYDRODIURIL) 25 MG tablet Take 1 tablet (25 mg total) by mouth daily. 07/18/12  Yes [name]  labetalol (NORMODYNE) 200 MG tablet Take 200 mg by mouth 2 (two) times daily.   Yes Historical  Provider, English  olmesartan (BENICAR) 40 MG tablet Take 40 mg by mouth daily.   Yes Historical Provider, English  omeprazole (PRILOSEC) 40 MG capsule Take 1 capsule (40 mg total) by mouth daily. 11/14/12  Yes [name]  simvastatin (ZOCOR) 40 MG tablet Take 40 mg by mouth every evening.   Yes Historical Provider, English    Allergies:   Allergies  Allergen Reactions  . Lisinopril Swelling    Social History:  reports that he has been smoking Cigarettes.  He has been smoking about 0.50 packs per day. He does not have any smokeless tobacco history on file. He reports that he does not drink alcohol or use illicit drugs.  Family History: Family History  Problem Relation Age of Onset  . Hypertension Mother   . Heart failure Mother   . Hypertension Father   . Colon cancer Father 43  . Colon cancer Paternal Uncle     Physical Exam: Filed Vitals:   03/26/13 0100 03/26/13 0115 03/26/13 0130 03/26/13 0156  BP: 184/91 187/100 190/105   Pulse: 43 43 47   Temp:    98.5 F (36.9 C)  TempSrc:      Resp: 11 13 13    Height:  Weight:      SpO2: 100% 100% 100%    General appearance: alert, cooperative and no distress Head: Normocephalic, without obvious abnormality, atraumatic Eyes: negative Nose: Nares normal. Septum midline. Mucosa normal. No drainage or sinus tenderness. Neck: no JVD and supple, symmetrical, trachea midline Lungs: clear to auscultation bilaterally Heart: regular rate and rhythm, S1, S2 normal, no murmur, click, rub or gallop Abdomen: soft, non-tender; bowel sounds normal; no masses,  no organomegaly Extremities: extremities normal, atraumatic, no cyanosis or edema Pulses: 2+ and symmetric Skin: Skin color, texture, turgor normal. No rashes or lesions Neurologic: Grossly normal reports numbness to left arm and leg   Labs on Admission:   Recent Labs  03/26/13 0025 03/26/13 0030  NA 141 143  K 3.8 3.7  CL 106 105  CO2 24  --   GLUCOSE 90 90  BUN 14 14   CREATININE 1.21 1.40*  CALCIUM 9.2  --     Recent Labs  03/26/13 0025  AST 17  ALT 9  ALKPHOS 74  BILITOT 0.7  PROT 7.3  ALBUMIN 4.0    Recent Labs  03/26/13 0025 03/26/13 0030  WBC 5.8  --   NEUTROABS 2.2  --   HGB 13.1 14.3  HCT 37.7* 42.0  MCV 86.7  --   PLT 233  --     Recent Labs  03/26/13 0025  TROPONINI <0.30   Radiological Exams on Admission: Ct Head Wo Contrast  03/26/2013   CLINICAL DATA:  Code stroke. Headache and slow response  EXAM: CT HEAD WITHOUT CONTRAST  TECHNIQUE: Contiguous axial images were obtained from the base of the skull through the vertex without intravenous contrast.  COMPARISON:  07/17/2012  FINDINGS: Skull and Sinuses:No significant abnormality.  Orbits: No acute abnormality.  Brain: No evidence of acute abnormality, such as acute large vascular territory infarction, hemorrhage, hydrocephalus, or mass lesion/mass effect. Extensive patchy bilateral cerebral white matter low density, predominantly subcortical, similar in pattern and severity.  These results were called by telephone at the time of interpretation on 03/26/2013 at 12:38 AM to code stroke attending, who verbally acknowledged these results.  IMPRESSION: 1. No evidence of acute hemorrhage or large vessel infarct. 2. Extensive white matter disease which appears stable from priors. Although nonspecific, history of hypertension and hyperlipidemia favors chronic small vessel ischemia.   Electronically Signed   By: [name] M.D.   On: 03/26/2013 00:39    Assessment/Plan  52 yo male with migraine, left sided numbness, medical noncompliance with chronically uncontrolled htn  Principal Problem:   Headache  - unclear if complicated migraine.  Symptoms seem to be improving with treatment.  Neurology has been consulted and recommend mri in am.  freq neuro cks overnight.  Would allow for some permission high bp.  Give dose of norvasc now.  Will give paramenters for hydralazine prn order.   Migraine treatment per neuro team also ordered.  Further w/u pending mri results.  Will not reck echo which has recently been done.  Active Problems:   HYPERLIPIDEMIA   HYPERTENSION   CAD (coronary artery disease)   Left sided numbness   Drug noncompliance  Have discussed with patient the risks of noncompliance with taking his medications, that eventually this may cause a debilitating stroke.  He has been counseled on the dangers of not taking his antihypertensive medications.  Tiphany Fayson A 03/26/2013, 2:27 AM

## 2013-03-26 NOTE — Progress Notes (Signed)
NEURO HOSPITALIST PROGRESS NOTE   SUBJECTIVE:                                                                                                                         Continues to have slight HA 2/10, HA actually dissipated last night and now returned. Left sided weakness is improving.     OBJECTIVE:                                                                                                                           Vital signs in last 24 hours: Temp:  [98 F (36.7 C)-98.5 F (36.9 C)] 98 F (36.7 C) (10/27 0655) Pulse Rate:  [43-60] 60 (10/27 0655) Resp:  [11-20] 16 (10/27 0655) BP: (150-221)/(67-109) 155/88 mmHg (10/27 0655) SpO2:  [100 %] 100 % (10/27 0655) Weight:  [87.59 kg (193 lb 1.6 oz)-87.998 kg (194 lb)] 87.59 kg (193 lb 1.6 oz) (10/27 0240)  Intake/Output from previous day:   Intake/Output this shift:   Nutritional status: NPO  Past Medical History  Diagnosis Date  . CAD (coronary artery disease) 10/12/2011    Mild to mod dz on cath 2011;  Dr Verdis Prime  . Hypertension   . Hyperlipidemia   . HOH (hard of hearing) 07/2011    rgiht ear  . Myocardial infarction     x2; 2 years ago and last year per pt.  . Stroke   . Migraine headache   . Chronic back pain      Neurologic Exam:  Mental Status: Alert, oriented, thought content appropriate.  Speech fluent without evidence of aphasia.  Able to follow 3 step commands without difficulty. Cranial Nerves: II: Discs flat bilaterally; Visual fields grossly normal, pupils equal, round, reactive to light and accommodation III,IV, VI: ptosis not present, extra-ocular motions intact bilaterally V,VII: smile symmetric, facial light touch sensation normal bilaterally VIII: hearing normal bilaterally IX,X: gag reflex present XI: bilateral shoulder shrug XII: midline tongue extension without atrophy or fasciculations  Motor: Right : Upper extremity   5/5    Left:     Upper  extremity   5/5  Lower extremity   5/5     Lower extremity   5/5 Tone and bulk:normal tone  throughout; no atrophy noted Sensory: Pinprick and light touch intact throughout, bilaterally Deep Tendon Reflexes:  Right: Upper Extremity   Left: Upper extremity   biceps (C-5 to C-6) 2/4   biceps (C-5 to C-6) 2/4 tricep (C7) 2/4    triceps (C7) 2/4 Brachioradialis (C6) 2/4  Brachioradialis (C6) 2/4  Lower Extremity Lower Extremity  quadriceps (L-2 to L-4) 2/4   quadriceps (L-2 to L-4) 2/4 Achilles (S1) 2/4   Achilles (S1) 2/4  Plantars: Right: downgoing   Left: downgoing Cerebellar: normal finger-to-nose,  normal heel-to-shin test Gait: not tested CV: pulses palpable throughout    Lab Results: Lab Results  Component Value Date/Time   CHOL 171 07/28/2012  9:08 AM   Lipid Panel No results found for this basename: CHOL, TRIG, HDL, CHOLHDL, VLDL, LDLCALC,  in the last 72 hours  Studies/Results: Ct Head Wo Contrast  03/26/2013   CLINICAL DATA:  Code stroke. Headache and slow response  EXAM: CT HEAD WITHOUT CONTRAST  TECHNIQUE: Contiguous axial images were obtained from the base of the skull through the vertex without intravenous contrast.  COMPARISON:  07/17/2012  FINDINGS: Skull and Sinuses:No significant abnormality.  Orbits: No acute abnormality.  Brain: No evidence of acute abnormality, such as acute large vascular territory infarction, hemorrhage, hydrocephalus, or mass lesion/mass effect. Extensive patchy bilateral cerebral white matter low density, predominantly subcortical, similar in pattern and severity.  These results were called by telephone at the time of interpretation on 03/26/2013 at 12:38 AM to code stroke attending, who verbally acknowledged these results.  IMPRESSION: 1. No evidence of acute hemorrhage or large vessel infarct. 2. Extensive white matter disease which appears stable from priors. Although nonspecific, history of hypertension and hyperlipidemia favors chronic small  vessel ischemia.   Electronically Signed   By: Tiburcio Pea M.D.   On: 03/26/2013 00:39    MEDICATIONS                                                                                                                        Scheduled: . sodium chloride   Intravenous STAT  . amLODipine  10 mg Oral Daily  . aspirin  300 mg Rectal Daily   Or  . aspirin  325 mg Oral Daily  . [START ON 03/27/2013] dexamethasone  4 mg Intravenous Q12H  . hydrochlorothiazide  25 mg Oral Daily  . irbesartan  300 mg Oral Daily  . labetalol  200 mg Oral BID  . [START ON 03/27/2013] pneumococcal 23 valent vaccine  0.5 mL Intramuscular Tomorrow-1000  . simvastatin  40 mg Oral QPM  . sodium chloride  3 mL Intravenous Q12H  . valproate sodium  500 mg Intravenous Q8H    ASSESSMENT/PLAN:  [name] male with acute onset left sided weakness-numbness, trouble speaking associated with HA. CT brain negative. HA is improving.  MRI brain is pending.  1) Will decrease VPA to one dose 250 mg at 1600 and then stop.  2) Will stop Decadron 3) MRI is pending.  4) Will continue to follow     Assessment and plan discussed with with attending physician and they are in agreement.    Felicie Morn PA-C [facility] [phone]  03/26/2013, 10:16 AM

## 2013-03-26 NOTE — Discharge Summary (Addendum)
Physician Discharge Summary  [name] ZOX:[id number] DOB: 10/17/1960 DOA: 03/26/2013  PCP: Sanda Linger, MD  Admit date: 03/26/2013 Discharge date: 03/26/2013  Time spent: 40 minutes  Recommendations for Outpatient Follow-up:   1. TIA; All neurological symptoms resolved  2. HTN uncontrolled; patient's BP much better controlled than at admission, still not within [facility] guidelines.  -increase on discharge labetalol to 300 mg BID, increase HCTZ to 50 mg daily, continue other BP meds unchanged -Counseled patient on need for close followup with PCP   3. Migraine Headache; most likely secondary to uncontrolled hypertension,noncompliance and nicotine dependence  4. Noncompliance; counseled about sequela of continuing to not comply with medication  5. Nicotine dependence; start patient on Wellbutrin; again counseled the patient strongly on sequela of continuing to smoke, and not control his BP to include a massive stroke/MI/death    Discharge Diagnoses:  Principal Problem:   Headache Active Problems:   HYPERLIPIDEMIA   HYPERTENSION   CAD (coronary artery disease)   Left sided numbness   Drug noncompliance   Discharge Condition: stable  Diet recommendation: heart healthy  Filed Weights   03/26/13 0048 03/26/13 0240  Weight: 87.998 kg (194 lb) 87.59 kg (193 lb 1.6 oz)    History of present illness:  52 yo BM PMHx HTN, HLD, Migraine HA has been having a migraine for about one week now. His wife says he does not take his bp meds on a regular basis. And his headaches do get worse when he does not take his meds. He ran out of his norvasc days ago. He started having some left leg numbness today. No fevers. No n/v/d. No blurred vision. He would not talk to his wife earlier, but can speak normally to me. Thought initially he had aphasia. He responds appropriately to me. His headache is some better with treatment in the ED, his bp is also improved initially was 230 now down in the  180s. No cp. No sob. 03/26/2013 states still has slight headache, slight photophobia but greatly improved. TODAY continues to have a mild headache rated at 4/10 and mild photophobia      Procedures: MRI brain without contrast 03/26/2013 No acute infarct.  Minimal progression of prominent white matter type changes. It is  possible this reflects result of advanced small vessel disease type  changes in this hypertensive hyperlipidemic smoker. Other  considerations as noted above.   Head CT without contrast 03/26/2013  1. No evidence of acute hemorrhage or large vessel infarct.  2. Extensive white matter disease which appears stable from priors.  Although nonspecific, history of hypertension and hyperlipidemia  favors chronic small vessel ischemia.   Consultations:  neurology  Discharge Exam: Filed Vitals:   03/26/13 0655 03/26/13 1043 03/26/13 1454 03/26/13 1827  BP: 155/88 158/87 150/88 152/75  Pulse: 60 58 66 79  Temp: 98 F (36.7 C) 98.2 F (36.8 C) 98.5 F (36.9 C) 98.5 F (36.9 C)  TempSrc: Oral Oral Oral Oral  Resp: 16 16 16 16   Height:      Weight:      SpO2: 100% 100% 96% 100%    General: A./O. X4, NAD Cardiovascular: regular with a rate, negative murmurs rubs gallops, DP/PT pulses 2+ bilateral Respiratory: clear to auscultation bilateral  Discharge Instructions   Future Appointments Provider Department Dept Phone   04/10/2013 9:00 AM [name], MD [facility] [phone]       Medication List    ASK your doctor about these medications  amLODipine 10 MG tablet  Commonly known as:  NORVASC  Take 10 mg by mouth daily.     aspirin 325 MG tablet  Take 325 mg by mouth daily.     GOODY HEADACHE PO  Take 1 packet by mouth every 4 (four) hours as needed (headache).     hydrochlorothiazide 25 MG tablet  Commonly known as:  HYDRODIURIL  Take 1 tablet (25 mg total) by mouth daily.     labetalol 200 MG tablet  Commonly  known as:  NORMODYNE  Take 200 mg by mouth 2 (two) times daily.     olmesartan 40 MG tablet  Commonly known as:  BENICAR  Take 40 mg by mouth daily.     omeprazole 40 MG capsule  Commonly known as:  PRILOSEC  Take 1 capsule (40 mg total) by mouth daily.     simvastatin 40 MG tablet  Commonly known as:  ZOCOR  Take 40 mg by mouth every evening.       Allergies  Allergen Reactions  . Lisinopril Swelling      The results of significant diagnostics from this hospitalization (including imaging, microbiology, ancillary and laboratory) are listed below for reference.    Significant Diagnostic Studies: Ct Head Wo Contrast  03/26/2013   CLINICAL DATA:  Code stroke. Headache and slow response  EXAM: CT HEAD WITHOUT CONTRAST  TECHNIQUE: Contiguous axial images were obtained from the base of the skull through the vertex without intravenous contrast.  COMPARISON:  07/17/2012  FINDINGS: Skull and Sinuses:No significant abnormality.  Orbits: No acute abnormality.  Brain: No evidence of acute abnormality, such as acute large vascular territory infarction, hemorrhage, hydrocephalus, or mass lesion/mass effect. Extensive patchy bilateral cerebral white matter low density, predominantly subcortical, similar in pattern and severity.  These results were called by telephone at the time of interpretation on 03/26/2013 at 12:38 AM to code stroke attending, who verbally acknowledged these results.  IMPRESSION: 1. No evidence of acute hemorrhage or large vessel infarct. 2. Extensive white matter disease which appears stable from priors. Although nonspecific, history of hypertension and hyperlipidemia favors chronic small vessel ischemia.   Electronically Signed   By: Tiburcio Pea M.D.   On: 03/26/2013 00:39   [name]  03/26/2013   CLINICAL DATA:  Headache went left-sided weakness. Hypertensive hyperlipidemic patient with history migraine headaches.  EXAM: MRI HEAD WITHOUT CONTRAST  TECHNIQUE:  Multiplanar, multisequence MR imaging was performed. No intravenous contrast was administered.  COMPARISON:  03/26/2013 CT. 11/13/2011 and 02/20/2006 MR.  FINDINGS: No acute infarct.  No intracranial hemorrhage.  Minimal progression of prominent white matter type changes. It is possible this reflects result of advanced small vessel disease type changes in this hypertensive hyperlipidemic smoker. Other considerations include white matter type changes secondary to; vasculitis, inflammatory process, demyelinating process, migraine headaches, remote trauma/ infection or CADASIL (cerebral autosomal dominant arteriopathy with subcortical infarcts and leukoencephalopathy).  No hydrocephalus.  No intracranial mass lesion noted on this unenhanced exam.  Major intracranial vascular structures are patent.  IMPRESSION: No acute infarct.  Minimal progression of prominent white matter type changes. It is possible this reflects result of advanced small vessel disease type changes in this hypertensive hyperlipidemic smoker. Other considerations as noted above.   Electronically Signed   By: Bridgett Larsson M.D.   On: 03/26/2013 13:59    Microbiology: No results found for this or any previous visit (from the past 240 hour(s)).   Labs: Basic Metabolic Panel:  Recent Labs  Lab 03/26/13 0025 03/26/13 0030 03/26/13 0548  NA 141 143 138  K 3.8 3.7 3.8  CL 106 105 104  CO2 24  --  22  GLUCOSE 90 90 118*  BUN 14 14 12   CREATININE 1.21 1.40* 1.16  CALCIUM 9.2  --  9.0   Liver Function Tests:  Recent Labs Lab 03/26/13 0025  AST 17  ALT 9  ALKPHOS 74  BILITOT 0.7  PROT 7.3  ALBUMIN 4.0   No results found for this basename: LIPASE, AMYLASE,  in the last 168 hours No results found for this basename: AMMONIA,  in the last 168 hours CBC:  Recent Labs Lab 03/26/13 0025 03/26/13 0030 03/26/13 0548  WBC 5.8  --  5.4  NEUTROABS 2.2  --   --   HGB 13.1 14.3 13.1  HCT 37.7* 42.0 37.7*  MCV 86.7  --  86.9  PLT  233  --  223   Cardiac Enzymes:  Recent Labs Lab 03/26/13 0025  TROPONINI <0.30   BNP: BNP (last 3 results) No results found for this basename: PROBNP,  in the last 8760 hours CBG:  Recent Labs Lab 03/26/13 0133  GLUCAP 83       Signed:  Carolyne Littles, MD [facility] [phone] pager

## 2013-03-26 NOTE — Code Documentation (Signed)
52 yo bm brought in via [facility] for HA & stroke symptoms.  Per wife pt has had a migraine HA all day, & was in the [facility] on 3 days ago for a migraine HA that he'd had for over a week.  Pt states he has been taking BC powders for his HA without relief.  Per wife pt c/o of a HA and was later found to be leaning on the Rt side over the bathroom sink & sticking his tongue out to the left.  On arrival to ED pt is alert and with significant coaxing willing to answer questions.  No aphasia or dysarthria noted.  Pt c/o of sensory loss.  NIH 3.  Code stroke called 0011, pt arrival 0022, LKW 2230, EDP exam 0022, stroke team arrival 0017, neurologist arrival 0019, pt arrival in CT 0024, phlebotomist arrival 0017.  No acute stroke treatment.  Plan to tx migraine & admit to medicine service.

## 2013-03-27 ENCOUNTER — Other Ambulatory Visit: Payer: Self-pay | Admitting: Internal Medicine

## 2013-03-27 DIAGNOSIS — Z9119 Patient's noncompliance with other medical treatment and regimen: Secondary | ICD-10-CM

## 2013-03-27 DIAGNOSIS — G43909 Migraine, unspecified, not intractable, without status migrainosus: Secondary | ICD-10-CM

## 2013-03-27 DIAGNOSIS — F17200 Nicotine dependence, unspecified, uncomplicated: Secondary | ICD-10-CM

## 2013-03-27 DIAGNOSIS — G459 Transient cerebral ischemic attack, unspecified: Secondary | ICD-10-CM

## 2013-03-27 MED ORDER — MORPHINE SULFATE 2 MG/ML IJ SOLN
INTRAMUSCULAR | Status: AC
  Administered 2013-03-27: 2 mg via INTRAVENOUS
  Filled 2013-03-27: qty 1

## 2013-03-27 MED ORDER — BUPROPION HCL 75 MG PO TABS: 150.0000 mg | ORAL_TABLET | Freq: Every day | ORAL | Status: DC

## 2013-03-27 MED ORDER — HYDROCHLOROTHIAZIDE 50 MG PO TABS: 50.0000 mg | ORAL_TABLET | Freq: Every day | ORAL | Status: DC

## 2013-03-27 MED ORDER — MORPHINE SULFATE 2 MG/ML IJ SOLN
2.0000 mg | Freq: Once | INTRAMUSCULAR | Status: AC
  Administered 2013-03-27: 2 mg via INTRAVENOUS

## 2013-03-27 MED ORDER — BUPROPION HCL 75 MG PO TABS
150.0000 mg | ORAL_TABLET | Freq: Every day | ORAL | Status: DC
  Administered 2013-03-27: 150 mg via ORAL
  Filled 2013-03-27: qty 2

## 2013-03-27 MED ORDER — BUTALBITAL-APAP-CAFFEINE 50-325-40 MG PO TABS: 2.0000 | ORAL_TABLET | Freq: Once | ORAL | Status: DC

## 2013-03-27 MED ORDER — HYDROCHLOROTHIAZIDE 50 MG PO TABS
50.0000 mg | ORAL_TABLET | Freq: Every day | ORAL | Status: DC
  Filled 2013-03-27: qty 1

## 2013-03-27 MED ORDER — BUTALBITAL-APAP-CAFFEINE 50-325-40 MG PO TABS
2.0000 | ORAL_TABLET | Freq: Once | ORAL | Status: AC
  Administered 2013-03-27: 2 via ORAL
  Filled 2013-03-27: qty 2

## 2013-03-27 MED ORDER — BUPROPION HCL 75 MG PO TABS: 150.0000 mg | ORAL_TABLET | Freq: Two times a day (BID) | ORAL | Status: DC

## 2013-03-27 MED ORDER — HYDROCHLOROTHIAZIDE 25 MG PO TABS
25.0000 mg | ORAL_TABLET | Freq: Once | ORAL | Status: AC
  Administered 2013-03-27: 25 mg via ORAL
  Filled 2013-03-27: qty 1

## 2013-03-27 MED ORDER — LABETALOL HCL 300 MG PO TABS: 300.0000 mg | ORAL_TABLET | Freq: Two times a day (BID) | ORAL | Status: DC

## 2013-03-27 MED ORDER — BUTALBITAL-APAP-CAFFEINE 50-325-40 MG PO TABS: 1.0000 | ORAL_TABLET | Freq: Once | ORAL | Status: DC

## 2013-03-27 NOTE — Progress Notes (Signed)
Subjective: Patient reports headache has worsened again.  Rates headache at a 6/10.  Has no further focal complaints.    Objective: Current vital signs: BP 162/97  Pulse 60  Temp(Src) 97.8 F (36.6 C) (Oral)  Resp 20  Ht 5\' 8"  (1.727 m)  Wt 87.59 kg (193 lb 1.6 oz)  BMI 29.37 kg/m2  SpO2 100% Vital signs in last 24 hours: Temp:  [97.8 F (36.6 C)-98.5 F (36.9 C)] 97.8 F (36.6 C) (10/28 1001) Pulse Rate:  [58-79] 60 (10/28 1001) Resp:  [16-20] 20 (10/28 1001) BP: (150-170)/(75-97) 162/97 mmHg (10/28 1001) SpO2:  [96 %-100 %] 100 % (10/28 1001)  Intake/Output from previous day: 10/27 0701 - 10/28 0700 In: -  Out: 425 [Urine:425] Intake/Output this shift: Total I/O In: 120 [P.O.:120] Out: -  Nutritional status: Cardiac  Neurologic Exam: Mental Status:  Alert, oriented, thought content appropriate. Speech fluent without evidence of aphasia. Able to follow 3 step commands without difficulty.  Cranial Nerves:  II: Discs flat bilaterally; Visual fields grossly normal, pupils equal, round, reactive to light and accommodation  III,IV, VI: ptosis not present, extra-ocular motions intact bilaterally  V,VII: smile symmetric, facial light touch sensation normal bilaterally  VIII: hearing normal bilaterally  IX,X: gag reflex present  XI: bilateral shoulder shrug  XII: midline tongue extension without atrophy or fasciculations  Motor:  5/5 throughout Sensory: Pinprick and light touch intact throughout Deep Tendon Reflexes:  2+ throughout Plantars:  Right: downgoing     Left: downgoing    Lab Results: Basic Metabolic Panel:  Recent Labs Lab 03/26/13 0025 03/26/13 0030 03/26/13 0548  NA 141 143 138  K 3.8 3.7 3.8  CL 106 105 104  CO2 24  --  22  GLUCOSE 90 90 118*  BUN 14 14 12   CREATININE 1.21 1.40* 1.16  CALCIUM 9.2  --  9.0    Liver Function Tests:  Recent Labs Lab 03/26/13 0025  AST 17  ALT 9  ALKPHOS 74  BILITOT 0.7  PROT 7.3  ALBUMIN 4.0   No  results found for this basename: LIPASE, AMYLASE,  in the last 168 hours No results found for this basename: AMMONIA,  in the last 168 hours  CBC:  Recent Labs Lab 03/26/13 0025 03/26/13 0030 03/26/13 0548  WBC 5.8  --  5.4  NEUTROABS 2.2  --   --   HGB 13.1 14.3 13.1  HCT 37.7* 42.0 37.7*  MCV 86.7  --  86.9  PLT 233  --  223    Cardiac Enzymes:  Recent Labs Lab 03/26/13 0025  TROPONINI <0.30    Lipid Panel: No results found for this basename: CHOL, TRIG, HDL, CHOLHDL, VLDL, LDLCALC,  in the last 168 hours  CBG:  Recent Labs Lab 03/26/13 0133  GLUCAP 83    Microbiology: Results for orders placed in visit on 10/12/11  URINE CULTURE     Status: None   Collection Time    10/12/11  9:00 AM      Result Value Range Status   Colony Count NO GROWTH   Final   Organism ID, Bacteria NO GROWTH   Final    Coagulation Studies:  Recent Labs  03/26/13 0025  LABPROT 13.6  INR 1.06    Imaging: Ct Head Wo Contrast  03/26/2013   CLINICAL DATA:  Code stroke. Headache and slow response  EXAM: CT HEAD WITHOUT CONTRAST  TECHNIQUE: Contiguous axial images were obtained from the base of the skull through the vertex  without intravenous contrast.  COMPARISON:  07/17/2012  FINDINGS: Skull and Sinuses:No significant abnormality.  Orbits: No acute abnormality.  Brain: No evidence of acute abnormality, such as acute large vascular territory infarction, hemorrhage, hydrocephalus, or mass lesion/mass effect. Extensive patchy bilateral cerebral white matter low density, predominantly subcortical, similar in pattern and severity.  These results were called by telephone at the time of interpretation on 03/26/2013 at 12:38 AM to code stroke attending, who verbally acknowledged these results.  IMPRESSION: 1. No evidence of acute hemorrhage or large vessel infarct. 2. Extensive white matter disease which appears stable from priors. Although nonspecific, history of hypertension and hyperlipidemia  favors chronic small vessel ischemia.   Electronically Signed   By: Tiburcio Pea M.D.   On: 03/26/2013 00:39   [name]  03/26/2013   CLINICAL DATA:  Headache went left-sided weakness. Hypertensive hyperlipidemic patient with history migraine headaches.  EXAM: MRI HEAD WITHOUT CONTRAST  TECHNIQUE: Multiplanar, multisequence MR imaging was performed. No intravenous contrast was administered.  COMPARISON:  03/26/2013 CT. 11/13/2011 and 02/20/2006 MR.  FINDINGS: No acute infarct.  No intracranial hemorrhage.  Minimal progression of prominent white matter type changes. It is possible this reflects result of advanced small vessel disease type changes in this hypertensive hyperlipidemic smoker. Other considerations include white matter type changes secondary to; vasculitis, inflammatory process, demyelinating process, migraine headaches, remote trauma/ infection or CADASIL (cerebral autosomal dominant arteriopathy with subcortical infarcts and leukoencephalopathy).  No hydrocephalus.  No intracranial mass lesion noted on this unenhanced exam.  Major intracranial vascular structures are patent.  IMPRESSION: No acute infarct.  Minimal progression of prominent white matter type changes. It is possible this reflects result of advanced small vessel disease type changes in this hypertensive hyperlipidemic smoker. Other considerations as noted above.   Electronically Signed   By: Bridgett Larsson M.D.   On: 03/26/2013 13:59    Medications:  I have reviewed the patient's current medications. Scheduled: . amLODipine  10 mg Oral Daily  . aspirin  300 mg Rectal Daily   Or  . aspirin  325 mg Oral Daily  . atorvastatin  20 mg Oral q1800  . hydrochlorothiazide  50 mg Oral Daily  . irbesartan  300 mg Oral Daily  . labetalol  300 mg Oral BID  . pneumococcal 23 valent vaccine  0.5 mL Intramuscular Tomorrow-1000  . sodium chloride  3 mL Intravenous Q12H    Assessment/Plan: BP elevated this morning.   Reports some worsening of headache.  Work up for focal symptoms unremarkable with MRI of the brain showing no acute changes.  There is evidence of small vessel disease likely related to his multiple co-morbidities.  Focal symptoms therefore likely related to complicated migraines.  Recommendations: 1.  Agree with BP control 2.  Agree with continued ASA 3.  Would consider Topamax 25mg  at night for migraine prophylaxis.  4.  No further neurologic intervention is recommended at this time.  If further questions arise, please call or page at that time.  Thank you for allowing neurology to participate in the care of this patient.    LOS: 1 day   Thana Farr, MD [facility] [phone] 03/27/2013  10:20 AM

## 2013-04-10 ENCOUNTER — Ambulatory Visit: Payer: 59 | Admitting: Interventional Cardiology

## 2013-04-23 ENCOUNTER — Encounter: Payer: Self-pay | Admitting: Interventional Cardiology

## 2013-04-23 ENCOUNTER — Ambulatory Visit (INDEPENDENT_AMBULATORY_CARE_PROVIDER_SITE_OTHER): Payer: 59 | Admitting: Interventional Cardiology

## 2013-04-23 VITALS — BP 150/100 | HR 67 | Ht 68.0 in | Wt 194.0 lb

## 2013-04-23 DIAGNOSIS — E785 Hyperlipidemia, unspecified: Secondary | ICD-10-CM

## 2013-04-23 DIAGNOSIS — I10 Essential (primary) hypertension: Secondary | ICD-10-CM

## 2013-04-23 DIAGNOSIS — I2510 Atherosclerotic heart disease of native coronary artery without angina pectoris: Secondary | ICD-10-CM

## 2013-04-23 MED ORDER — OLMESARTAN MEDOXOMIL 40 MG PO TABS: 40.0000 mg | ORAL_TABLET | Freq: Every day | ORAL | Status: DC

## 2013-04-23 MED ORDER — ASPIRIN 325 MG PO TABS: 325.0000 mg | ORAL_TABLET | Freq: Every day | ORAL | Status: DC

## 2013-04-23 MED ORDER — SIMVASTATIN 40 MG PO TABS: 40.0000 mg | ORAL_TABLET | Freq: Every evening | ORAL | Status: DC

## 2013-04-23 NOTE — Progress Notes (Signed)
Patient ID: [name], male   DOB: 16-Mar-1961, 52 y.o.   MRN: [id number]    [address] [place], [place]  [address] Phone: [phone] Fax:  [phone]  Date:  04/23/2013   ID:  [name], DOB 04/26/1961, MRN [id number]  PCP:  Sanda Linger, MD   ASSESSMENT:  1. Coronary disease, stable without angina 2. Hypertension, elevated today but his diarrhea had medications 3. Hyperlipidemia on therapy  PLAN:  1. Take today's medication and maintain compliance 2. Clinical followup in one year 3. Return if any clinical symptoms.   SUBJECTIVE: [name] is a 52 y.o. male who has occasional headaches. These did not correlate with blood pressure control. No recent chest pain. Has not had his antihypertensive therapy at today. He still has difficulty with maintaining excellent medication compliance. Overall he has no complaints. He denies dyspnea. No new neurological complaints. He denies palpitations.   Wt Readings from Last 3 Encounters:  04/23/13 194 lb (87.998 kg)  03/26/13 193 lb 1.6 oz (87.59 kg)  03/22/13 194 lb (87.998 kg)     Past Medical History  Diagnosis Date  . CAD (coronary artery disease) 10/12/2011    Mild to mod dz on cath 2011;  Dr Verdis Prime  . Hypertension   . Hyperlipidemia   . HOH (hard of hearing) 07/2011    rgiht ear  . Myocardial infarction     x2; 2 years ago and last year per pt.  . Stroke   . Migraine headache   . Chronic back pain     Current Outpatient Prescriptions  Medication Sig Dispense Refill  . amLODipine (NORVASC) 10 MG tablet Take 10 mg by mouth daily.      Marland Kitchen aspirin 325 MG tablet Take 325 mg by mouth daily.      Marland Kitchen buPROPion (WELLBUTRIN) 75 MG tablet Take 75 mg by mouth 2 (two) times daily.      Marland Kitchen BUTALBITAL-ACETAMINOPHEN PO Take by mouth as needed.      . butalbital-acetaminophen-caffeine (FIORICET, ESGIC) 50-325-40 MG per tablet Take 1-2 tablets by mouth once. 1-2 tablets at the start of a headache  may  repeat q. 4 hours. Do not exceed 6 tablets per 24 hours  30 tablet  0  . hydrochlorothiazide (HYDRODIURIL) 50 MG tablet Take 1 tablet (50 mg total) by mouth daily.  30 tablet  0  . labetalol (NORMODYNE) 300 MG tablet Take 1 tablet (300 mg total) by mouth 2 (two) times daily.  60 tablet  0  . olmesartan (BENICAR) 40 MG tablet Take 40 mg by mouth daily.      Marland Kitchen omeprazole (PRILOSEC) 40 MG capsule Take 1 capsule (40 mg total) by mouth daily.  30 capsule  3  . simvastatin (ZOCOR) 40 MG tablet Take 40 mg by mouth every evening.       No current facility-administered medications for this visit.    Allergies:    Allergies  Allergen Reactions  . Lisinopril Swelling    Social History:  The patient  reports that he has been smoking Cigarettes.  He has been smoking about 0.50 packs per day. He does not have any smokeless tobacco history on file. He reports that he does not drink alcohol or use illicit drugs.   ROS:  Please see the history of present illness.   Syncope, palpitations, and transient neurological complaints are denied.   All other systems reviewed and negative.   OBJECTIVE: VS:  BP 150/100  Pulse  67  Ht 5\' 8"  (1.727 m)  Wt 194 lb (87.998 kg)  BMI 29.50 kg/m2  SpO2 99% Well nourished, well developed, in no acute distress, healthy HEENT: normal Neck: JVD flat. Carotid bruit 2-3+ bilateral  Cardiac:  normal S1, S2; RRR; no murmur Lungs:  clear to auscultation bilaterally, no wheezing, rhonchi or rales Abd: soft, nontender, no hepatomegaly Ext: Edema absent. Pulses 2+ Skin: warm and dry Neuro:  CNs 2-12 intact, no focal abnormalities noted  EKG:  Not performed       Signed, Darci Needle III, MD 04/23/2013 10:55 AM

## 2013-04-23 NOTE — Patient Instructions (Addendum)
Your physician recommends that you continue on your current medications as directed. Please refer to the Current Medication list given to you today.  A refill of your medications has been sent to your pharmacy  Try to follow a low sodium diet  Your physician wants you to follow-up in: 1 year You will receive a reminder letter in the mail two months in advance. If you don't receive a letter, please call our office to schedule the follow-up appointment.

## 2013-07-05 ENCOUNTER — Emergency Department (HOSPITAL_COMMUNITY)
Admission: EM | Admit: 2013-07-05 | Discharge: 2013-07-05 | Disposition: A | Discharge status: Home / Self Care | Payer: BC Managed Care – PPO | Source: Home / Self Care | Attending: Family Medicine | Admitting: Family Medicine

## 2013-07-05 ENCOUNTER — Encounter (HOSPITAL_COMMUNITY): Payer: Self-pay | Admitting: Emergency Medicine

## 2013-07-05 DIAGNOSIS — S61209A Unspecified open wound of unspecified finger without damage to nail, initial encounter: Secondary | ICD-10-CM

## 2013-07-05 DIAGNOSIS — S61211A Laceration without foreign body of left index finger without damage to nail, initial encounter: Secondary | ICD-10-CM

## 2013-07-05 DIAGNOSIS — W261XXA Contact with sword or dagger, initial encounter: Secondary | ICD-10-CM

## 2013-07-05 DIAGNOSIS — Y9269 Other specified industrial and construction area as the place of occurrence of the external cause: Secondary | ICD-10-CM

## 2013-07-05 DIAGNOSIS — W260XXA Contact with knife, initial encounter: Secondary | ICD-10-CM

## 2013-07-05 MED ORDER — CEPHALEXIN 500 MG PO CAPS: 500.0000 mg | ORAL_CAPSULE | Freq: Four times a day (QID) | ORAL | Status: DC

## 2013-07-05 MED ORDER — DOXYCYCLINE HYCLATE 100 MG PO CAPS: 100.0000 mg | ORAL_CAPSULE | Freq: Two times a day (BID) | ORAL | Status: DC

## 2013-07-05 NOTE — ED Notes (Signed)
C/o left ring finger laceration States yesterday while at work he cut his finger opening a box with a knife No treatment tried to but bandaging finger  States had tetanus shot a few years ago

## 2013-07-05 NOTE — Discharge Instructions (Signed)
Thank you for coming in today. Keep the wound clean and covered with ointment. Take doxycycline twice daily and Keflex 4 times daily for antibiotics. If not getting better or worsening please followup at Dr. Merrilee Seashore office

## 2013-07-05 NOTE — ED Provider Notes (Addendum)
[name] is a 53 y.o. male who presents to Urgent Care today for left dorsal index finger laceration. Patient suffered a laceration to the dorsal aspect of his left index finger of the skin overlying the PIP yesterday afternoon. He cleaned the wound with peroxide. He notes the wound is slightly more painful today than yesterday. He denies any extending pain fevers or chills. He denies any weakness with extension or numbness. No nausea vomiting or diarrhea. Last tetanus vaccination was about 2 years ago.  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   Past Medical History  Diagnosis Date  . CAD (coronary artery disease) 10/12/2011    Mild to mod dz on cath 2011;  Dr Verdis PrimeHenry Smith  . Hypertension   . Hyperlipidemia   . HOH (hard of hearing) 07/2011    rgiht ear  . Myocardial infarction     x2; 2 years ago and last year per pt.  . Stroke   . Migraine headache   . Chronic back pain    History  Substance Use Topics  . Smoking status: Current Every Day Smoker -- 0.50 packs/day    Types: Cigarettes  . Smokeless tobacco: Not on file  . Alcohol Use: No     Comment: monthly   ROS as above Medications: No current facility-administered medications for this encounter.   Current Outpatient Prescriptions  Medication Sig Dispense Refill  . amLODipine (NORVASC) 10 MG tablet Take 10 mg by mouth daily.      Marland Kitchen. aspirin 325 MG tablet Take 1 tablet (325 mg total) by mouth daily.  30 tablet  11  . buPROPion (WELLBUTRIN) 75 MG tablet Take 75 mg by mouth 2 (two) times daily.      Marland Kitchen. BUTALBITAL-ACETAMINOPHEN PO Take by mouth as needed.      . butalbital-acetaminophen-caffeine (FIORICET, ESGIC) 50-325-40 MG per tablet Take 1-2 tablets by mouth once. 1-2 tablets at the start of a headache may  repeat q. 4 hours. Do not exceed 6 tablets per 24 hours  30 tablet  0  . cephALEXin (KEFLEX) 500 MG capsule Take 1 capsule (500 mg total) by mouth 4 (four) times daily.  40 capsule  0  . doxycycline  (VIBRAMYCIN) 100 MG capsule Take 1 capsule (100 mg total) by mouth 2 (two) times daily.  20 capsule  0  . hydrochlorothiazide (HYDRODIURIL) 50 MG tablet Take 1 tablet (50 mg total) by mouth daily.  30 tablet  0  . labetalol (NORMODYNE) 300 MG tablet Take 1 tablet (300 mg total) by mouth 2 (two) times daily.  60 tablet  0  . olmesartan (BENICAR) 40 MG tablet Take 1 tablet (40 mg total) by mouth daily.  30 tablet  11  . omeprazole (PRILOSEC) 40 MG capsule Take 1 capsule (40 mg total) by mouth daily.  30 capsule  3  . simvastatin (ZOCOR) 40 MG tablet Take 1 tablet (40 mg total) by mouth every evening.  30 tablet  11    Exam:  BP 188/97  Pulse 60  Temp(Src) 98.3 F (36.8 C) (Oral)  Resp 14  SpO2 98% Gen: Well NAD LEFT HAND: 1 cm laceration of the skin overlying the dorsal aspect of the second PIP. Laceration extends through the dermis but not to deep structures and does not involve the tendon. Wound explored with range of motion. Strength is intact to extension. Sensation is intact throughout  Wound clean out.  Consent obtained and timeout performed. Anesthesia was achieved using a  digital block using 4 mL of lidocaine without epinephrine. The wound was copiously irrigated with sterile saline. A compression dressing was applied. Minimal bleeding..    Assessment and Plan: 53 y.o. male with left finger laceration. It does not appear to involve any deep structures. Patient does not have tenosynovitis clinically. Plan to use empiric antibiotics. Will use doxycycline and Keflex. Followup with hand surgery if worsening. Carefully reviewed precautions. Unable to suture wound due to delay in treatment.  Discussed warning signs or symptoms. Please see discharge instructions. Patient expresses understanding.    Rodolph Bong, MD 07/05/13 1331  Rodolph Bong, MD 07/05/13 [phone]

## 2013-07-09 ENCOUNTER — Telehealth: Payer: Self-pay | Admitting: *Deleted

## 2013-07-09 NOTE — Telephone Encounter (Signed)
PA to [facility] via [email or web address] approved benicar, [facility] pharmacy notified.

## 2013-07-11 ENCOUNTER — Other Ambulatory Visit: Payer: Self-pay

## 2013-07-11 MED ORDER — AMLODIPINE BESYLATE 10 MG PO TABS: 10.0000 mg | ORAL_TABLET | Freq: Every day | ORAL | Status: DC

## 2013-08-31 ENCOUNTER — Encounter (HOSPITAL_COMMUNITY): Payer: Self-pay | Admitting: Emergency Medicine

## 2013-08-31 ENCOUNTER — Emergency Department (HOSPITAL_COMMUNITY)
Admission: EM | Admit: 2013-08-31 | Discharge: 2013-08-31 | Disposition: A | Discharge status: Home / Self Care | Payer: BC Managed Care – PPO | Attending: Emergency Medicine | Admitting: Emergency Medicine

## 2013-08-31 DIAGNOSIS — Z79899 Other long term (current) drug therapy: Secondary | ICD-10-CM | POA: Insufficient documentation

## 2013-08-31 DIAGNOSIS — E785 Hyperlipidemia, unspecified: Secondary | ICD-10-CM | POA: Insufficient documentation

## 2013-08-31 DIAGNOSIS — R5383 Other fatigue: Secondary | ICD-10-CM

## 2013-08-31 DIAGNOSIS — Z7982 Long term (current) use of aspirin: Secondary | ICD-10-CM | POA: Insufficient documentation

## 2013-08-31 DIAGNOSIS — F17200 Nicotine dependence, unspecified, uncomplicated: Secondary | ICD-10-CM | POA: Insufficient documentation

## 2013-08-31 DIAGNOSIS — I10 Essential (primary) hypertension: Secondary | ICD-10-CM | POA: Insufficient documentation

## 2013-08-31 DIAGNOSIS — R5381 Other malaise: Secondary | ICD-10-CM | POA: Insufficient documentation

## 2013-08-31 DIAGNOSIS — G8929 Other chronic pain: Secondary | ICD-10-CM | POA: Insufficient documentation

## 2013-08-31 DIAGNOSIS — I252 Old myocardial infarction: Secondary | ICD-10-CM | POA: Insufficient documentation

## 2013-08-31 DIAGNOSIS — Z8673 Personal history of transient ischemic attack (TIA), and cerebral infarction without residual deficits: Secondary | ICD-10-CM | POA: Insufficient documentation

## 2013-08-31 DIAGNOSIS — I2510 Atherosclerotic heart disease of native coronary artery without angina pectoris: Secondary | ICD-10-CM | POA: Insufficient documentation

## 2013-08-31 DIAGNOSIS — Z9889 Other specified postprocedural states: Secondary | ICD-10-CM | POA: Insufficient documentation

## 2013-08-31 DIAGNOSIS — N419 Inflammatory disease of prostate, unspecified: Secondary | ICD-10-CM | POA: Insufficient documentation

## 2013-08-31 DIAGNOSIS — G43909 Migraine, unspecified, not intractable, without status migrainosus: Secondary | ICD-10-CM | POA: Insufficient documentation

## 2013-08-31 LAB — URINALYSIS, ROUTINE W REFLEX MICROSCOPIC
BILIRUBIN URINE: NEGATIVE
GLUCOSE, UA: NEGATIVE mg/dL
Ketones, ur: 15 mg/dL — AB
Nitrite: NEGATIVE
PROTEIN: 30 mg/dL — AB
Specific Gravity, Urine: 1.023 (ref 1.005–1.030)
UROBILINOGEN UA: 0.2 mg/dL (ref 0.0–1.0)
pH: 6 (ref 5.0–8.0)

## 2013-08-31 LAB — COMPREHENSIVE METABOLIC PANEL
ALT: 15 U/L (ref 0–53)
AST: 22 U/L (ref 0–37)
Albumin: 3.9 g/dL (ref 3.5–5.2)
Alkaline Phosphatase: 77 U/L (ref 39–117)
BUN: 11 mg/dL (ref 6–23)
CALCIUM: 8.9 mg/dL (ref 8.4–10.5)
CO2: 24 meq/L (ref 19–32)
CREATININE: 1.4 mg/dL — AB (ref 0.50–1.35)
Chloride: 103 mEq/L (ref 96–112)
GFR, EST AFRICAN AMERICAN: 66 mL/min — AB (ref >90)
GFR, EST NON AFRICAN AMERICAN: 57 mL/min — AB (ref >90)
Glucose, Bld: 97 mg/dL (ref 70–99)
Potassium: 3.8 mEq/L (ref 3.7–5.3)
SODIUM: 140 meq/L (ref 137–147)
Total Bilirubin: 1.5 mg/dL — ABNORMAL HIGH (ref 0.3–1.2)
Total Protein: 7.5 g/dL (ref 6.0–8.3)

## 2013-08-31 LAB — CBC WITH DIFFERENTIAL/PLATELET
Basophils Absolute: 0 10*3/uL (ref 0.0–0.1)
Basophils Relative: 0 % (ref 0–1)
EOS ABS: 0 10*3/uL (ref 0.0–0.7)
EOS PCT: 0 % (ref 0–5)
HEMATOCRIT: 37 % — AB (ref 39.0–52.0)
Hemoglobin: 13 g/dL (ref 13.0–17.0)
LYMPHS ABS: 1.9 10*3/uL (ref 0.7–4.0)
Lymphocytes Relative: 9 % — ABNORMAL LOW (ref 12–46)
MCH: 30.8 pg (ref 26.0–34.0)
MCHC: 35.1 g/dL (ref 30.0–36.0)
MCV: 87.7 fL (ref 78.0–100.0)
MONO ABS: 1.3 10*3/uL — AB (ref 0.1–1.0)
Monocytes Relative: 6 % (ref 3–12)
Neutro Abs: 18.3 10*3/uL — ABNORMAL HIGH (ref 1.7–7.7)
Neutrophils Relative %: 85 % — ABNORMAL HIGH (ref 43–77)
PLATELETS: 224 10*3/uL (ref 150–400)
RBC: 4.22 MIL/uL (ref 4.22–5.81)
RDW: 11.9 % (ref 11.5–15.5)
WBC: 21.5 10*3/uL — ABNORMAL HIGH (ref 4.0–10.5)

## 2013-08-31 LAB — URINE MICROSCOPIC-ADD ON

## 2013-08-31 MED ORDER — ACETAMINOPHEN 325 MG PO TABS
650.0000 mg | ORAL_TABLET | Freq: Once | ORAL | Status: AC
  Administered 2013-08-31: 650 mg via ORAL
  Filled 2013-08-31: qty 2

## 2013-08-31 MED ORDER — SODIUM CHLORIDE 0.9 % IV BOLUS (SEPSIS)
500.0000 mL | Freq: Once | INTRAVENOUS | Status: AC
Start: 2013-08-31 — End: 2013-08-31
  Administered 2013-08-31: 500 mL via INTRAVENOUS

## 2013-08-31 MED ORDER — SODIUM CHLORIDE 0.9 % IV BOLUS (SEPSIS)
1000.0000 mL | Freq: Once | INTRAVENOUS | Status: AC
  Administered 2013-08-31: 1000 mL via INTRAVENOUS

## 2013-08-31 MED ORDER — SULFAMETHOXAZOLE-TMP DS 800-160 MG PO TABS
1.0000 | ORAL_TABLET | Freq: Once | ORAL | Status: AC
  Administered 2013-08-31: 1 via ORAL
  Filled 2013-08-31: qty 1

## 2013-08-31 MED ORDER — SULFAMETHOXAZOLE-TRIMETHOPRIM 800-160 MG PO TABS
1.0000 | ORAL_TABLET | Freq: Two times a day (BID) | ORAL | Status: DC
Start: 2013-08-31 — End: 2013-10-18

## 2013-08-31 MED ORDER — IBUPROFEN 800 MG PO TABS
800.0000 mg | ORAL_TABLET | Freq: Once | ORAL | Status: AC
  Administered 2013-08-31: 800 mg via ORAL
  Filled 2013-08-31: qty 1

## 2013-08-31 MED ORDER — DEXTROSE 5 % IV SOLN
1.0000 g | Freq: Once | INTRAVENOUS | Status: AC
  Administered 2013-08-31: 1 g via INTRAVENOUS
  Filled 2013-08-31: qty 10

## 2013-08-31 NOTE — ED Provider Notes (Signed)
CSN: [id number]     Arrival date & time 08/31/13  1904 History   First MD Initiated Contact with Patient 08/31/13 2003     Chief Complaint  Patient presents with  . Generalized Body Aches  . Back Pain   HPI  History provided by the patient and wife. Patient is a 53 year old male with history of hypertension, hyperlipidemia, CAD who presents with complaints of increasing low back pain, generalized body aches and headache today. Patient denies any fever, chills or sweats. Denies any recent travel. Denies any cough, congestion or URI symptoms. No nausea, vomiting or diarrhea. Denies any dysuria, hematuria urinary frequency. No other aggravating or alleviating factors. No other associated symptoms.    Past Medical History  Diagnosis Date  . CAD (coronary artery disease) 10/12/2011    Mild to mod dz on cath 2011;  Dr Verdis Prime  . Hypertension   . Hyperlipidemia   . HOH (hard of hearing) 07/2011    rgiht ear  . Myocardial infarction     x2; 2 years ago and last year per pt.  . Stroke   . Migraine headache   . Chronic back pain    Past Surgical History  Procedure Laterality Date  . Inguinal hernia repair     Family History  Problem Relation Age of Onset  . Hypertension Mother   . Heart failure Mother   . Hypertension Father   . Colon cancer Father 64  . Colon cancer Paternal Uncle    History  Substance Use Topics  . Smoking status: Current Every Day Smoker -- 0.50 packs/day    Types: Cigarettes  . Smokeless tobacco: Not on file  . Alcohol Use: No     Comment: monthly    Review of Systems  Constitutional: Positive for fatigue. Negative for fever, chills, diaphoresis and appetite change.  HENT: Negative for congestion, rhinorrhea, sinus pressure and sore throat.   Respiratory: Negative for cough.   Gastrointestinal: Negative for nausea, vomiting, abdominal pain, diarrhea and constipation.  Genitourinary: Negative for dysuria, frequency, hematuria, flank pain and discharge.   Musculoskeletal: Positive for back pain.  Skin: Negative for rash.      Allergies  Lisinopril  Home Medications   Current Outpatient Rx  Name  Route  Sig  Dispense  Refill  . amLODipine (NORVASC) 10 MG tablet   Oral   Take 10 mg by mouth daily.         Marland Kitchen aspirin EC 325 MG tablet   Oral   Take 325 mg by mouth daily.         Marland Kitchen buPROPion (WELLBUTRIN) 75 MG tablet   Oral   Take 75 mg by mouth 2 (two) times daily.         . butalbital-acetaminophen-caffeine (FIORICET, ESGIC) 50-325-40 MG per tablet   Oral   Take 1-2 tablets by mouth every 4 (four) hours as needed for headache.         . labetalol (NORMODYNE) 300 MG tablet   Oral   Take 300 mg by mouth 2 (two) times daily.         Marland Kitchen olmesartan (BENICAR) 40 MG tablet   Oral   Take 40 mg by mouth daily.         . simvastatin (ZOCOR) 40 MG tablet   Oral   Take 40 mg by mouth daily.          BP 130/92  Pulse 75  Temp(Src) 101.4 F (38.6 C) (Oral)  Resp  18  SpO2 100% Physical Exam  Nursing note and vitals reviewed. Constitutional: He is oriented to person, place, and time. He appears well-developed and well-nourished. No distress.  HENT:  Head: Normocephalic and atraumatic.  Mouth/Throat: Oropharynx is clear and moist.  Eyes: Conjunctivae are normal.  Neck: Normal range of motion. Neck supple.  No meningeal signs  Cardiovascular: Normal rate and regular rhythm.   Pulmonary/Chest: Effort normal and breath sounds normal. No respiratory distress. He has no wheezes. He has no rales.  Abdominal: Soft. There is no tenderness. There is no rebound and no guarding.  No CVA tenderness  Genitourinary:  Patient refused rectal and genital exam  Neurological: He is alert and oriented to person, place, and time.  Skin: Skin is warm. No rash noted.  Psychiatric: He has a normal mood and affect. His behavior is normal.    ED Course  Procedures   COORDINATION OF CARE:  Nursing notes reviewed. Vital signs  reviewed. Initial pt interview and examination performed.   Filed Vitals:   08/31/13 1920 08/31/13 2002  BP: 152/78 130/92  Pulse: 76 75  Temp: 101 F (38.3 C) 101.4 F (38.6 C)  TempSrc: Oral Oral  Resp: 18 18  SpO2: 93% 100%    9:32 PM-patient seen and evaluated. Patient does not appear in any acute distress. Does not appear severely ill or toxic. He is febrile. Normal heart rate and blood pressures. Normal respirations and O2 sats.  Patient has refused a rectal exam to check prostate. His significant other does report that she believes he had a prostate infection in the past. During review of his past medical charts he was diagnosed with a UTI in 2008. Cultures showed Escherichia coli. Patient believes that he had to change medications to chew a prostate infection at that time. Given his past symptoms and fever today we'll plan to treat as prostatitis. Patient reports that he has an appointment with his primary care next week and will have his prostate checked at that time. Strict return precautions given.   Treatment plan initiated: Medications  sulfamethoxazole-trimethoprim (BACTRIM DS) 800-160 MG per tablet 1 tablet (not administered)  sodium chloride 0.9 % bolus 500 mL (not administered)  acetaminophen (TYLENOL) tablet 650 mg (650 mg Oral Given 08/31/13 2047)  sodium chloride 0.9 % bolus 1,000 mL (1,000 mLs Intravenous New Bag/Given 08/31/13 2045)  cefTRIAXone (ROCEPHIN) 1 g in dextrose 5 % 50 mL IVPB (0 g Intravenous Stopped 08/31/13 2116)  ibuprofen (ADVIL,MOTRIN) tablet 800 mg (800 mg Oral Given 08/31/13 2141)    Results for orders placed during the hospital encounter of 08/31/13  URINALYSIS, ROUTINE W REFLEX MICROSCOPIC      Result Value Ref Range   Color, Urine AMBER (*) YELLOW   APPearance CLOUDY (*) CLEAR   Specific Gravity, Urine 1.023  1.005 - 1.030   pH 6.0  5.0 - 8.0   Glucose, UA NEGATIVE  NEGATIVE mg/dL   Hgb urine dipstick MODERATE (*) NEGATIVE   Bilirubin Urine  NEGATIVE  NEGATIVE   Ketones, ur 15 (*) NEGATIVE mg/dL   Protein, ur 30 (*) NEGATIVE mg/dL   Urobilinogen, UA 0.2  0.0 - 1.0 mg/dL   Nitrite NEGATIVE  NEGATIVE   Leukocytes, UA MODERATE (*) NEGATIVE  CBC WITH DIFFERENTIAL      Result Value Ref Range   WBC 21.5 (*) 4.0 - 10.5 K/uL   RBC 4.22  4.22 - 5.81 MIL/uL   Hemoglobin 13.0  13.0 - 17.0 g/dL   HCT 78.2 (*)  39.0 - 52.0 %   MCV 87.7  78.0 - 100.0 fL   MCH 30.8  26.0 - 34.0 pg   MCHC 35.1  30.0 - 36.0 g/dL   RDW 11.9  36.611.5 - 44.015.5 %72.5   Platelets 224  150 - 400 K/uL   Neutrophils Relative % 85 (*) 43 - 77 %   Neutro Abs 18.3 (*) 1.7 - 7.7 K/uL   Lymphocytes Relative 9 (*) 12 - 46 %   Lymphs Abs 1.9  0.7 - 4.0 K/uL   Monocytes Relative 6  3 - 12 %   Monocytes Absolute 1.3 (*) 0.1 - 1.0 K/uL   Eosinophils Relative 0  0 - 5 %   Eosinophils Absolute 0.0  0.0 - 0.7 K/uL   Basophils Relative 0  0 - 1 %   Basophils Absolute 0.0  0.0 - 0.1 K/uL  COMPREHENSIVE METABOLIC PANEL      Result Value Ref Range   Sodium 140  137 - 147 mEq/L   Potassium 3.8  3.7 - 5.3 mEq/L   Chloride 103  96 - 112 mEq/L   CO2 24  19 - 32 mEq/L   Glucose, Bld 97  70 - 99 mg/dL   BUN 11  6 - 23 mg/dL   Creatinine, Ser 3.471.40 (*) 0.50 - 1.35 mg/dL   Calcium 8.9  8.4 - 42.510.5 mg/dL   Total Protein 7.5  6.0 - 8.3 g/dL   Albumin 3.9  3.5 - 5.2 g/dL   AST 22  0 - 37 U/L   ALT 15  0 - 53 U/L   Alkaline Phosphatase 77  39 - 117 U/L   Total Bilirubin 1.5 (*) 0.3 - 1.2 mg/dL   GFR calc non Af Amer 57 (*) >90 mL/min   GFR calc Af Amer 66 (*) >90 mL/min  URINE MICROSCOPIC-ADD ON      Result Value Ref Range   Squamous Epithelial / LPF FEW (*) RARE   WBC, UA 21-50  <3 WBC/hpf   RBC / HPF 3-6  <3 RBC/hpf   Bacteria, UA FEW (*) RARE   Urine-Other MUCOUS PRESENT       MDM   Final diagnoses:  Prostatitis        Angus Sellereter S Tamlyn Sides, PA-C 08/31/13 2237

## 2013-08-31 NOTE — ED Notes (Signed)
Pt. reports generalized body aches with low back pain and headache onset yesterday , denies fever or chills / no injury or hematuria .

## 2013-08-31 NOTE — Discharge Instructions (Signed)
You were seen and evaluated for your body aches, headaches and fatigue. You were found to have signs of a urinary tract infection and your providers suspect possible prostate infection. Please use the medication prescribed and followup with your primary care provider next week as planned. You may also wish to followup with the urology specialist for continued evaluation and treatment. Return if you have any worsening symptoms. Use Tylenol and ibuprofen for fever.    Prostatitis The prostate gland is about the size and shape of a walnut. It is located just below your bladder. It produces one of the components of semen, which is made up of sperm and the fluids that help nourish and transport it out from the testicles. Prostatitis is inflammation of the prostate gland.  There are four types of prostatitis:  Acute bacterial prostatitis This is the least common type of prostatitis. It starts quickly and usually is associated with a bladder infection, high fever, and shaking chills. It can occur at any age.  Chronic bacterial prostatitis This is a persistent bacterial infection in the prostate. It usually develops from repeated acute bacterial prostatitis or acute bacterial prostatitis that was not properly treated. It can occur in men of any age but is most common in middle-aged men whose prostate has begun to enlarge. The symptoms are not as severe as those in acute bacterial prostatitis. Discomfort in the part of your body that is in front of your rectum and below your scrotum (perineum), lower abdomen, or in the head of your penis (glans) may represent your primary discomfort.  Chronic prostatitis (nonbacterial) This is the most common type of prostatitis. It is inflammation of the prostate gland that is not caused by a bacterial infection. The cause is unknown and may be associated with a viral infection or autoimmune disorder.  Prostatodynia (pelvic floor disorder) This is associated with increased  muscular tone in the pelvis surrounding the prostate. CAUSES The causes of bacterial prostatitis are bacterial infection. The causes of the other types of prostatitis are unknown.  SYMPTOMS  Symptoms can vary depending upon the type of prostatitis that exists. There can also be overlap in symptoms. Possible symptoms for each type of prostatitis are listed below. Acute Bacterial Prostatitis  Painful urination.  Fever or chills.  Muscle or joint pains.  Low back pain.  Low abdominal pain.  Inability to empty bladder completely. Chronic Bacterial Prostatitis, Chronic Nonbacterial Prostatitis, and Prostatodynia  Sudden urge to urinate.  Frequent urination.  Difficulty starting urine stream.  Weak urine stream.  Discharge from the urethra.  Dribbling after urination.  Rectal pain.  Pain in the testicles, penis, or tip of the penis.  Pain in the perineum.  Problems with sexual function.  Painful ejaculation.  Bloody semen. DIAGNOSIS  In order to diagnose prostatitis, your health care provider will ask about your symptoms. One or more urine samples will be taken and tested (urinalysis). If the urinalysis result is negative for bacteria, your health care provider may use a finger to feel your prostate (digital rectal exam). This exam helps your health care provider determine if your prostate is swollen and tender. It will also produce a specimen of semen that can be analyzed. TREATMENT  Treatment for prostatitis depends on the cause. If a bacterial infection is the cause, it can be treated with antibiotic medicine. In cases of chronic bacterial prostatitis, the use of antibiotics for up to 1 month or 6 weeks may be necessary. Your health care provider may instruct  you to take sitz baths to help relieve pain. A sitz bath is a bath of hot water in which your hips and buttocks are under water. This relaxes the pelvic floor muscles and often helps to relieve the pressure on your  prostate. HOME CARE INSTRUCTIONS   Take all medicines as directed by your health care provider.  Take sitz baths as directed by your health care provider. SEEK MEDICAL CARE IF:   Your symptoms get worse, not better.  You have a fever. SEEK IMMEDIATE MEDICAL CARE IF:   You have chills.  You feel nauseous or vomit.  You feel lightheaded or faint.  You are unable to urinate.  You have blood or blood clots in your urine. Document Released: 05/14/2000 Document Revised: 03/07/2013 Document Reviewed: 12/04/2012 [facility] Patient Information 2014 [employer] [place].

## 2013-09-02 NOTE — ED Provider Notes (Signed)
Medical screening examination/treatment/procedure(s) were performed by non-physician practitioner and as supervising physician I was immediately available for consultation/collaboration.   Nelia Shi, MD 09/02/13 1357

## 2013-09-03 LAB — URINE CULTURE: Colony Count: 50000

## 2013-09-05 ENCOUNTER — Telehealth (HOSPITAL_BASED_OUTPATIENT_CLINIC_OR_DEPARTMENT_OTHER): Payer: Self-pay | Admitting: Emergency Medicine

## 2013-09-05 NOTE — Telephone Encounter (Signed)
Post ED Visit - Positive Culture Follow-up  Culture report reviewed by antimicrobial stewardship pharmacist: []  Wes Dulaney, Pharm.D., BCPS []  Celedonio Miyamoto, Pharm.D., BCPS []  Georgina Pillion, Pharm.D., BCPS []  [place], [address].D., BCPS, AAHIVP []  Estella Husk, Pharm.D., BCPS, AAHIVP [x]  Harvie Junior, Pharm.D.  Positive urine culture Treated with Sulfa-Trimeth, organism sensitive to the same and no further patient follow-up is required at this time.  Manuel Lawhead 09/05/2013, 2:06 PM

## 2013-10-18 ENCOUNTER — Observation Stay (HOSPITAL_COMMUNITY)
Admission: EM | Admit: 2013-10-18 | Discharge: 2013-10-19 | Disposition: A | Discharge status: Home / Self Care | Payer: BC Managed Care – PPO | Attending: Interventional Cardiology | Admitting: Interventional Cardiology

## 2013-10-18 ENCOUNTER — Encounter (HOSPITAL_COMMUNITY): Payer: Self-pay | Admitting: Emergency Medicine

## 2013-10-18 ENCOUNTER — Emergency Department (HOSPITAL_COMMUNITY): Payer: BC Managed Care – PPO

## 2013-10-18 DIAGNOSIS — F17200 Nicotine dependence, unspecified, uncomplicated: Secondary | ICD-10-CM | POA: Insufficient documentation

## 2013-10-18 DIAGNOSIS — Z8673 Personal history of transient ischemic attack (TIA), and cerebral infarction without residual deficits: Secondary | ICD-10-CM | POA: Insufficient documentation

## 2013-10-18 DIAGNOSIS — I252 Old myocardial infarction: Secondary | ICD-10-CM | POA: Insufficient documentation

## 2013-10-18 DIAGNOSIS — R0602 Shortness of breath: Secondary | ICD-10-CM | POA: Insufficient documentation

## 2013-10-18 DIAGNOSIS — I421 Obstructive hypertrophic cardiomyopathy: Secondary | ICD-10-CM

## 2013-10-18 DIAGNOSIS — I422 Other hypertrophic cardiomyopathy: Secondary | ICD-10-CM | POA: Diagnosis present

## 2013-10-18 DIAGNOSIS — Z7982 Long term (current) use of aspirin: Secondary | ICD-10-CM | POA: Insufficient documentation

## 2013-10-18 DIAGNOSIS — I10 Essential (primary) hypertension: Secondary | ICD-10-CM | POA: Diagnosis present

## 2013-10-18 DIAGNOSIS — R42 Dizziness and giddiness: Secondary | ICD-10-CM | POA: Insufficient documentation

## 2013-10-18 DIAGNOSIS — R079 Chest pain, unspecified: Secondary | ICD-10-CM

## 2013-10-18 DIAGNOSIS — G43909 Migraine, unspecified, not intractable, without status migrainosus: Secondary | ICD-10-CM | POA: Insufficient documentation

## 2013-10-18 DIAGNOSIS — I2510 Atherosclerotic heart disease of native coronary artery without angina pectoris: Secondary | ICD-10-CM

## 2013-10-18 DIAGNOSIS — R110 Nausea: Secondary | ICD-10-CM | POA: Insufficient documentation

## 2013-10-18 DIAGNOSIS — G8929 Other chronic pain: Secondary | ICD-10-CM | POA: Insufficient documentation

## 2013-10-18 DIAGNOSIS — R0789 Other chest pain: Secondary | ICD-10-CM | POA: Diagnosis present

## 2013-10-18 DIAGNOSIS — Z79899 Other long term (current) drug therapy: Secondary | ICD-10-CM | POA: Insufficient documentation

## 2013-10-18 DIAGNOSIS — E785 Hyperlipidemia, unspecified: Secondary | ICD-10-CM | POA: Insufficient documentation

## 2013-10-18 DIAGNOSIS — Z9114 Patient's other noncompliance with medication regimen: Secondary | ICD-10-CM

## 2013-10-18 DIAGNOSIS — Z91148 Patient's other noncompliance with medication regimen for other reason: Secondary | ICD-10-CM

## 2013-10-18 LAB — CBC WITH DIFFERENTIAL/PLATELET
Basophils Absolute: 0 10*3/uL (ref 0.0–0.1)
Basophils Relative: 0 % (ref 0–1)
Eosinophils Absolute: 0.2 10*3/uL (ref 0.0–0.7)
Eosinophils Relative: 4 % (ref 0–5)
HEMATOCRIT: 34.4 % — AB (ref 39.0–52.0)
HEMOGLOBIN: 11.2 g/dL — AB (ref 13.0–17.0)
LYMPHS ABS: 2.5 10*3/uL (ref 0.7–4.0)
LYMPHS PCT: 46 % (ref 12–46)
MCH: 29.2 pg (ref 26.0–34.0)
MCHC: 32.6 g/dL (ref 30.0–36.0)
MCV: 89.8 fL (ref 78.0–100.0)
MONOS PCT: 8 % (ref 3–12)
Monocytes Absolute: 0.4 10*3/uL (ref 0.1–1.0)
NEUTROS ABS: 2.3 10*3/uL (ref 1.7–7.7)
Neutrophils Relative %: 42 % — ABNORMAL LOW (ref 43–77)
Platelets: 243 10*3/uL (ref 150–400)
RBC: 3.83 MIL/uL — ABNORMAL LOW (ref 4.22–5.81)
RDW: 12.7 % (ref 11.5–15.5)
WBC: 5.4 10*3/uL (ref 4.0–10.5)

## 2013-10-18 LAB — COMPREHENSIVE METABOLIC PANEL
ALT: 14 U/L (ref 0–53)
AST: 24 U/L (ref 0–37)
Albumin: 4 g/dL (ref 3.5–5.2)
Alkaline Phosphatase: 78 U/L (ref 39–117)
BUN: 15 mg/dL (ref 6–23)
CHLORIDE: 104 meq/L (ref 96–112)
CO2: 27 mEq/L (ref 19–32)
CREATININE: 1.27 mg/dL (ref 0.50–1.35)
Calcium: 9.1 mg/dL (ref 8.4–10.5)
GFR calc Af Amer: 74 mL/min — ABNORMAL LOW (ref >90)
GFR calc non Af Amer: 63 mL/min — ABNORMAL LOW (ref >90)
GLUCOSE: 84 mg/dL (ref 70–99)
Potassium: 3.8 mEq/L (ref 3.7–5.3)
Sodium: 143 mEq/L (ref 137–147)
Total Bilirubin: 0.4 mg/dL (ref 0.3–1.2)
Total Protein: 7.2 g/dL (ref 6.0–8.3)

## 2013-10-18 LAB — I-STAT TROPONIN, ED: TROPONIN I, POC: 0 ng/mL (ref 0.00–0.08)

## 2013-10-18 LAB — TROPONIN I: Troponin I: <0.3 ng/mL (ref <0.30)

## 2013-10-18 MED ORDER — AMLODIPINE BESYLATE 10 MG PO TABS
10.0000 mg | ORAL_TABLET | Freq: Every day | ORAL | Status: DC
  Administered 2013-10-18 – 2013-10-19 (×2): 10 mg via ORAL
  Filled 2013-10-18 (×2): qty 1

## 2013-10-18 MED ORDER — IRBESARTAN 300 MG PO TABS
300.0000 mg | ORAL_TABLET | Freq: Every day | ORAL | Status: DC
  Administered 2013-10-18 – 2013-10-19 (×2): 300 mg via ORAL
  Filled 2013-10-18 (×2): qty 1

## 2013-10-18 MED ORDER — ACETAMINOPHEN 325 MG PO TABS: 650.0000 mg | ORAL_TABLET | ORAL | Status: DC | PRN

## 2013-10-18 MED ORDER — ASPIRIN EC 81 MG PO TBEC
81.0000 mg | DELAYED_RELEASE_TABLET | Freq: Every day | ORAL | Status: DC
  Administered 2013-10-19: 81 mg via ORAL
  Filled 2013-10-18 (×2): qty 1

## 2013-10-18 MED ORDER — LABETALOL HCL 300 MG PO TABS
300.0000 mg | ORAL_TABLET | Freq: Two times a day (BID) | ORAL | Status: DC
  Administered 2013-10-18 – 2013-10-19 (×2): 300 mg via ORAL
  Filled 2013-10-18 (×3): qty 1

## 2013-10-18 MED ORDER — ONDANSETRON HCL 4 MG/2ML IJ SOLN: 4.0000 mg | Freq: Four times a day (QID) | INTRAMUSCULAR | Status: DC | PRN

## 2013-10-18 MED ORDER — SIMVASTATIN 40 MG PO TABS
40.0000 mg | ORAL_TABLET | Freq: Every day | ORAL | Status: DC
  Filled 2013-10-18: qty 1

## 2013-10-18 MED ORDER — ASPIRIN 81 MG PO CHEW
324.0000 mg | CHEWABLE_TABLET | Freq: Once | ORAL | Status: AC
  Administered 2013-10-18: 324 mg via ORAL
  Filled 2013-10-18: qty 4

## 2013-10-18 MED ORDER — NITROGLYCERIN 0.4 MG SL SUBL
0.4000 mg | SUBLINGUAL_TABLET | SUBLINGUAL | Status: DC | PRN
  Administered 2013-10-18 (×2): 0.4 mg via SUBLINGUAL
  Filled 2013-10-18: qty 1

## 2013-10-18 NOTE — H&P (Signed)
Admission History and Physical     Patient ID: Bryor Rami, MRN: [id number], DOB: 1961/02/14 53 y.o. Date of Encounter: 10/18/2013, 8:37 PM  Primary Physician: Sanda Linger, MD Primary Cardiologist: Verdis Prime, MD  Chief Complaint:  Chest Pain  History of Present Illness: [name] is a 53 y.o. male with a history of poorly controlled hypertension, apical hypertrophic cardiomyopathy, and CAD (Cath 2011), who presents with chest pain.  He has had exertional chest pain for several days, but for the 6 hours prior to presentation the pain became constant.  Describes the pain as squeezing, some radiation to arms bilaterally.  +SOB. Has been taking his BP medications, but only taking labetalol once daily.  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    On arrival to ED he was hypertensive to 186/112.  BP improved with NTG, but CP still present.  EKG with lateral and inferior TWI, similar to priors.  Trop-I negative x 1.  Of note, LV gram during cath in 2011, and echo last year with evidence of apical variant HCM.  Patient denies syncope, palpitations, family history of sudden cardiac death.    Past Medical History  Diagnosis Date  . CAD (coronary artery disease) 10/12/2011    Mild to mod dz on cath 2011;  Dr Verdis Prime  . Hypertension   . Hyperlipidemia   . HOH (hard of hearing) 07/2011    rgiht ear  . Myocardial infarction     x2; 2 years ago and last year per pt.  . Stroke   . Migraine headache   . Chronic back pain      Past Surgical History  Procedure Laterality Date  . Inguinal hernia repair        Current Facility-Administered Medications  Medication Dose Route Frequency Provider Last Rate Last Dose  . nitroGLYCERIN (NITROSTAT) SL tablet 0.4 mg  0.4 mg Sublingual Q5 min PRN Glynn Octave, MD   0.4 mg at 10/18/13 1954   Current Outpatient Prescriptions  Medication Sig Dispense Refill  . amLODipine (NORVASC) 10 MG  tablet Take 10 mg by mouth daily.      Marland Kitchen aspirin EC 325 MG tablet Take 325 mg by mouth daily.      . butalbital-acetaminophen-caffeine (FIORICET, ESGIC) 50-325-40 MG per tablet Take 1-2 tablets by mouth every 4 (four) hours as needed for headache or migraine.       Marland Kitchen labetalol (NORMODYNE) 300 MG tablet Take 300 mg by mouth 2 (two) times daily.      . Multiple Vitamin (MULTIVITAMIN WITH MINERALS) TABS tablet Take 1 tablet by mouth daily.      Marland Kitchen olmesartan (BENICAR) 40 MG tablet Take 40 mg by mouth daily.      . simvastatin (ZOCOR) 40 MG tablet Take 40 mg by mouth daily.          Allergies: Allergies  Allergen Reactions  . Lisinopril Swelling     Social History:  The patient  reports that he has been smoking Cigarettes.  He has been smoking about 0.50 packs per day. He does not have any smokeless tobacco history on file. He reports that he does not drink alcohol or use illicit drugs.   Family History:  The patient's family history includes Colon cancer in his paternal uncle; Colon cancer (age of onset: 70) in his father; Heart failure in his mother; Hypertension in his father and mother.   ROS:  Please see the  history of present illness.  All other systems reviewed and negative.   Vital Signs: Blood pressure 163/93, pulse 71, temperature 98.3 F (36.8 C), temperature source Oral, resp. rate 22, height 5\' 8"  (1.727 m), weight 90.039 kg (198 lb 8 oz), SpO2 99.00%.  PHYSICAL EXAM: General:  Well nourished, well developed, in no acute distress HEENT: normal Lymph: no adenopathy Neck: no JVD Endocrine:  No thryomegaly Vascular: No carotid bruits; FA pulses 2+ bilaterally without bruits Cardiac:  normal S1, S2; RRR; no murmur Lungs:  clear to auscultation bilaterally, no wheezing, rhonchi or rales Abd: soft, nontender, no hepatomegaly Ext: no edema Musculoskeletal:  No deformities, BUE and BLE strength normal and equal Skin: warm and dry Neuro:  CNs 2-12 intact, no focal abnormalities  noted Psych:  Normal affect   EKG:   NSR, lateral, inferior TWI, similar to prior, c/w apical HCM.  Labs:   Lab Results  Component Value Date   WBC 5.4 10/18/2013   HGB 11.2* 10/18/2013   HCT 34.4* 10/18/2013   MCV 89.8 10/18/2013   PLT 243 10/18/2013    Recent Labs Lab 10/18/13 1908  NA 143  K 3.8  CL 104  CO2 27  BUN 15  CREATININE 1.27  CALCIUM 9.1  PROT 7.2  BILITOT 0.4  ALKPHOS 78  ALT 14  AST 24  GLUCOSE 84   No results found for this basename: CKTOTAL, CKMB, TROPONINI,  in the last 72 hours Lab Results  Component Value Date   CHOL 171 07/28/2012   HDL 33.40* 07/28/2012   LDLCALC 123* 07/28/2012   TRIG 74.0 07/28/2012   No results found for this basename: DDIMER    Radiology/Studies:  Dg Chest Portable 1 View  10/18/2013   CLINICAL DATA:  Chest pain.  EXAM: PORTABLE CHEST - 1 VIEW  COMPARISON:  November 07, 2012.  FINDINGS: The heart size and mediastinal contours are within normal limits. Both lungs are clear. No pneumothorax or pleural effusion is noted. The visualized skeletal structures are unremarkable.  IMPRESSION: No acute cardiopulmonary abnormality seen.   Electronically Signed   By: Roque Lias M.D.   On: 10/18/2013 20:10   Cath 2011:  Left ventriculography:  Left ventricle demonstrates apical and     general hypertrophy.  There is apical cavity obliteration.  EF is     75%.  No mitral regurgitation is noted.  There is a spade shape     quality to the LV cavity at end diastole.  Coronary angiography. A:  Left main coronary:  Widely patent. B.  Left anterior descending coronary:  This is a large vessel that wraps around the left ventricular apex and supplies the inferior interventricular groove over a distance of approximately one third the entire length of this region.  The LAD contains a segmental 90% stenosis at the apex.  The diagonal number 1 is subtotally occluded in the mid vessel with 99% stenosis noted.  The vessel is small and not felt to be a  good target for PCI.  Diagonal number 2 contains proximal 70% narrowing.  LAD is otherwise widely patent. C.  Ramus intermedius branch:  The ramus is a large vessel that contains 50-60% narrowing before it bifurcates on the left lateral wall.  No high- grade obstruction is noted. D.  Circumflex artery:  The circumflex coronary artery gives origin to multiple branches and is free of any significant obstruction. E.  The right coronary:  The right coronary is a large vessel in caliber.  It supplies a large posterolateral branch.  There is 50% ostial narrowing and there is 40-50% distal RCA narrowing in the PDA ostium.  Several smaller inferior wall branches are noted.  No significant obstruction is noted in the right coronary territory.   Echo 2014:  EF > 55%, apical hypertrophy and akinesis.    ASSESSMENT AND PLAN:   1. Chest Pain - Typical anginal symptoms, now better after nitroglycerin - Needs better blood pressure control.  Will continue home meds (I presume he is missing doses in addition to the once a day labetalol that he endorses).  Given HCM and story that symptoms are worse in summer months agree with holding off on HCTZ. - aspirin, simvastatin - place in observation for serial troponins - Known CAD on cath in 2011.  Plan for repeat stress imaging vs. Cath.   2.  Poorly controlled hypertension - As above  3.  Apical-variant hypertrophic cardiomyopathy - Repeat imaging (stress echo or standalone echo if cath) - Does not give any concerning symptoms for sudden cardiac death risk.  ? Maximal wall thickness on echo  4.  Smoking - down to only a few cigarettes per day.  Signed,  Yaakov GuthrieJoseph Sivak, MD 10/18/2013, 8:37 PM

## 2013-10-18 NOTE — ED Provider Notes (Signed)
CSN: [id number]     Arrival date & time 10/18/13  1901 History   First MD Initiated Contact with Patient 10/18/13 1914     Chief Complaint  Patient presents with  . Chest Pain     (Consider location/radiation/quality/duration/timing/severity/associated sxs/prior Treatment) HPI Comments: Patient presents with one week history of substernal and left-sided chest pain has been intermittent but become constant in the past 6 hours. It is worse with exertion. It is better with rest. Is associated with shortness of breath and nausea. He is not taking any medications. He did not take his blood pressure medication today. Nitroglycerin has expired. He reports having an MI in 2011 but is not have any stents. He continues to smoke cigarettes. The pain is in his left chest it is not radiate to his neck, back or arm. Denies abdominal pain, nausea or vomiting. His cardiologist is Dr. Katrinka Blazing.  The history is provided by the patient.    Past Medical History  Diagnosis Date  . CAD (coronary artery disease) 10/12/2011    Mild to mod dz on cath 2011;  Dr Verdis Prime  . Hypertension   . Hyperlipidemia   . HOH (hard of hearing) 07/2011    rgiht ear  . Myocardial infarction     x2; 2 years ago and last year per pt.  . Stroke   . Migraine headache   . Chronic back pain    Past Surgical History  Procedure Laterality Date  . Inguinal hernia repair     Family History  Problem Relation Age of Onset  . Hypertension Mother   . Heart failure Mother   . Hypertension Father   . Colon cancer Father 66  . Colon cancer Paternal Uncle    History  Substance Use Topics  . Smoking status: Current Every Day Smoker -- 0.50 packs/day    Types: Cigarettes  . Smokeless tobacco: Not on file  . Alcohol Use: No     Comment: monthly    Review of Systems  Constitutional: Negative for fever, activity change and appetite change.  HENT: Negative for congestion and rhinorrhea.   Respiratory: Positive for chest  tightness. Negative for cough and shortness of breath.   Cardiovascular: Positive for chest pain.  Gastrointestinal: Positive for nausea. Negative for vomiting and abdominal pain.  Genitourinary: Negative for dysuria, hematuria and testicular pain.  Musculoskeletal: Negative for arthralgias and myalgias.  Skin: Negative for rash.  Neurological: Positive for dizziness. Negative for weakness and light-headedness.  A complete 10 system review of systems was obtained and all systems are negative except as noted in the HPI and PMH.      Allergies  Lisinopril  Home Medications   Prior to Admission medications   Medication Sig Start Date End Date Taking? Authorizing Provider  amLODipine (NORVASC) 10 MG tablet Take 10 mg by mouth daily.    Historical Provider, MD  aspirin EC 325 MG tablet Take 325 mg by mouth daily.    Historical Provider, MD  buPROPion (WELLBUTRIN) 75 MG tablet Take 75 mg by mouth 2 (two) times daily. 03/27/13   Drema Dallas, MD  butalbital-acetaminophen-caffeine (FIORICET, ESGIC) [phone] MG per tablet Take 1-2 tablets by mouth every 4 (four) hours as needed for headache.    Historical Provider, MD  labetalol (NORMODYNE) 300 MG tablet Take 300 mg by mouth 2 (two) times daily.    Historical Provider, MD  olmesartan (BENICAR) 40 MG tablet Take 40 mg by mouth daily.    Historical Provider,  MD  simvastatin (ZOCOR) 40 MG tablet Take 40 mg by mouth daily.    Historical Provider, MD  sulfamethoxazole-trimethoprim (SEPTRA DS) 800-160 MG per tablet Take 1 tablet by mouth 2 (two) times daily. X 14 days 08/31/13   Phill Mutter Dammen, PA-C   BP 140/97  Pulse 57  Temp(Src) 97.8 F (36.6 C) (Oral)  Resp 17  Ht 5\' 8"  (1.727 m)  Wt 195 lb (88.451 kg)  BMI 29.66 kg/m2  SpO2 100% Physical Exam  Constitutional: He is oriented to person, place, and time. He appears well-developed and well-nourished. No distress.  Hypertensive  HENT:  Head: Normocephalic and atraumatic.  Mouth/Throat:  Oropharynx is clear and moist. No oropharyngeal exudate.  Eyes: Conjunctivae and EOM are normal. Pupils are equal, round, and reactive to light.  Neck: Normal range of motion. Neck supple.  Cardiovascular: Normal rate, regular rhythm, normal heart sounds and intact distal pulses.   No murmur heard. Intact and equal radial, DP and PT pulses  Pulmonary/Chest: Effort normal and breath sounds normal. No respiratory distress. He exhibits no tenderness.  Chest pain is not reproducible.  Abdominal: Soft. There is no tenderness. There is no rebound and no guarding.  Musculoskeletal: Normal range of motion. He exhibits no edema and no tenderness.  Neurological: He is alert and oriented to person, place, and time. No cranial nerve deficit. He exhibits normal muscle tone. Coordination normal.  Skin: Skin is warm.    ED Course  Procedures (including critical care time) Labs Review Labs Reviewed  CBC WITH DIFFERENTIAL - Abnormal; Notable for the following:    RBC 3.83 (*)    Hemoglobin 11.2 (*)    HCT 34.4 (*)    Neutrophils Relative % 42 (*)    All other components within normal limits  COMPREHENSIVE METABOLIC PANEL - Abnormal; Notable for the following:    GFR calc non Af Amer 63 (*)    GFR calc Af Amer 74 (*)    All other components within normal limits  TROPONIN I  I-STAT TROPOININ, ED    Imaging Review Dg Chest Portable 1 View  10/18/2013   CLINICAL DATA:  Chest pain.  EXAM: PORTABLE CHEST - 1 VIEW  COMPARISON:  November 07, 2012.  FINDINGS: The heart size and mediastinal contours are within normal limits. Both lungs are clear. No pneumothorax or pleural effusion is noted. The visualized skeletal structures are unremarkable.  IMPRESSION: No acute cardiopulmonary abnormality seen.   Electronically Signed   By: Roque Lias M.D.   On: 10/18/2013 20:10     EKG Interpretation   Date/Time:  Thursday Oct 18 2013 19:06:11 EDT Ventricular Rate:  53 PR Interval:  178 QRS Duration: 88 QT  Interval:  416 QTC Calculation: 390 R Axis:   66 Text Interpretation:  Sinus bradycardia Minimal voltage criteria for LVH,  may be normal variant Anteroseptal infarct , age undetermined T wave  abnormality, consider inferolateral ischemia Abnormal ECG No significant  change since last tracing Confirmed by Bebe Shaggy  MD, DONALD ([id number]) on  10/18/2013 7:09:39 PM      MDM   Final diagnoses:  Chest pain   Chest pain, no radiation, concerning for angina. EKG shows T-wave inversions inferior laterally which are unchanged.  Review of patient's records shows catheterization in 2011 disease in diagonal branches that was too small to stent.  EKG unchanged. Troponin negative. ASA given. Nitro given with improvement in BP but persistent chest pain. Doubt PE or dissection. Pain seems typical for angina. D/w  Dr. Jon BillingsSivak of cardiology who will admit.   Glynn OctaveStephen Oaklyn Jakubek, MD 10/19/13 [phone]

## 2013-10-18 NOTE — ED Notes (Signed)
Pt states that he has been having CP for the past 2 days. Pt states that his pain is constant and did not change with time or medications.  Pt states that he has had a heart attack in the past and a stroke within the last 5 years.

## 2013-10-19 ENCOUNTER — Other Ambulatory Visit: Payer: Self-pay | Admitting: Physician Assistant

## 2013-10-19 DIAGNOSIS — I428 Other cardiomyopathies: Secondary | ICD-10-CM

## 2013-10-19 DIAGNOSIS — I2510 Atherosclerotic heart disease of native coronary artery without angina pectoris: Secondary | ICD-10-CM

## 2013-10-19 DIAGNOSIS — R079 Chest pain, unspecified: Principal | ICD-10-CM

## 2013-10-19 DIAGNOSIS — I10 Essential (primary) hypertension: Secondary | ICD-10-CM

## 2013-10-19 LAB — TROPONIN I: Troponin I: <0.3 ng/mL (ref <0.30)

## 2013-10-19 NOTE — Discharge Instructions (Signed)
Fioricet (butalbital/acetominophen/caffeine) can contribute to high blood pressure. Please try to avoid.  You have a Stress Test scheduled at the Cardiology office. No food after midnight before. Can have clear liquids such as water or broth up to 4 hours before. No caffeine/decaf products 24hr before, including meds such as Excedrin or Goody-Powders or Fioricet. Call if there are any questions. OK to take am meds with a sip of water. DO NOT TAKE LABETALOL THE NIGHT BEFORE OR THAT MORNING. Arrive about 15 min early for paperwork. Wear comfortable clothes and shoes. Do NOT take: Beta blockers such as metoprolol/lopressor/Toprol XL or calcium channel blockers such as cardizem/Diltiazem or verapmil/Calan for 24 hours before the test.  Remove nitroglycerin patches and do not take nitrate preparations such as Imdur/isosorbide. No Persantine/Theophylline or Aggrenox meds should be used within 24 hours of the test. Discuss with MD what to do about diabetes meds if you take these.  When you arrive in the lab, the technician will inject a small amount of radioactive tracer. After a waiting period, resting pictures will be obtained.   You will be prepped for the stress portion of the test. With the stress (treadmill), another small amount of radioactive tracer will be injected.  You will get a second set of pictures after a waiting period.   The whole test will take several hours.

## 2013-10-19 NOTE — Discharge Summary (Signed)
CARDIOLOGY DISCHARGE SUMMARY   Patient ID: [name] MRN: [id number] DOB/AGE: 1961-02-27 53 y.o.  Admit date: 10/18/2013 Discharge date: 10/19/2013  PCP: Sanda Lingerhomas Jones, MD Primary Cardiologist: Dr. Katrinka BlazingSmith  Primary Discharge Diagnosis:  Mid-sternal Chest pain Secondary Discharge Diagnosis:    HYPERTENSION   CAD (coronary artery disease)   Drug noncompliance   Apical variant hypertrophic cardiomyopathy  Procedures: Portable chest x-ray  Hospital Course: [name] is a 53 y.o. male with a history of CAD, treated medically. He had chest pain and came to the hospital where he was admitted for further evaluation and treatment.  His cardiac enzymes are negative for MI. He had significant hypertension on admission, with a systolic blood pressure as high as 186/112. This improved with restarting his home medications and sublingual nitroglycerin x2.  On 05/22, [name] was seen by Dr. Allyson SabalBerry. He was no longer having chest pain or shortness of breath, and his blood pressure control it improved. The importance of compliance with medications was emphasized. He is concerned about chest pain with exertion but he was advised to this is possibly from significant hypertension in the setting of unusual exertion and he is encouraged to continue to report these episodes. He was advised that the Fioricet could contribute to hypertension and is encouraged to discontinue it.  Dr. Allyson SabalBerry felt that he was stable for outpatient evaluation and he is considered stable for discharge, to follow up as an outpatient.   Labs:  Lab Results  Component Value Date   WBC 5.4 10/18/2013   HGB 11.2* 10/18/2013   HCT 34.4* 10/18/2013   MCV 89.8 10/18/2013   PLT 243 10/18/2013     Recent Labs Lab 10/18/13 1908  NA 143  K 3.8  CL 104  CO2 27  BUN 15  CREATININE 1.27  CALCIUM 9.1  PROT 7.2  BILITOT 0.4  ALKPHOS 78  ALT 14  AST 24  GLUCOSE 84    Recent Labs  10/18/13 2246 10/19/13 0450    TROPONINI <0.30 <0.30    Radiology: Dg Chest Portable 1 View 10/18/2013   CLINICAL DATA:  Chest pain.  EXAM: PORTABLE CHEST - 1 VIEW  COMPARISON:  November 07, 2012.  FINDINGS: The heart size and mediastinal contours are within normal limits. Both lungs are clear. No pneumothorax or pleural effusion is noted. The visualized skeletal structures are unremarkable.  IMPRESSION: No acute cardiopulmonary abnormality seen.   Electronically Signed   By: Roque LiasJames  Green M.D.   On: 10/18/2013 20:10   EKG: 18-Oct-2013 19:06:11  Sinus bradycardia Minimal voltage criteria for LVH, may be normal variant T wave abnormality No significant change since last tracing Vent. rate 53 BPM PR interval 178 ms QRS duration 88 ms QT/QTc 416/390 ms P-R-T axes 78 66 -11  FOLLOW UP PLANS AND APPOINTMENTS Allergies  Allergen Reactions  . Lisinopril Swelling     Medication List    STOP taking these medications       butalbital-acetaminophen-caffeine 50-325-40 MG per tablet  Commonly known as:  FIORICET, ESGIC      TAKE these medications       amLODipine 10 MG tablet  Commonly known as:  NORVASC  Take 10 mg by mouth daily.     aspirin EC 325 MG tablet  Take 325 mg by mouth daily.     labetalol 300 MG tablet  Commonly known as:  NORMODYNE  Take 300 mg by mouth 2 (two) times daily.     multivitamin with minerals  Tabs tablet  Take 1 tablet by mouth daily.     olmesartan 40 MG tablet  Commonly known as:  BENICAR  Take 40 mg by mouth daily.     simvastatin 40 MG tablet  Commonly known as:  ZOCOR  Take 40 mg by mouth daily.        Discharge Instructions   Diet - low sodium heart healthy    Complete by:  As directed      Increase activity slowly    Complete by:  As directed           Follow-up Information   Follow up with [facility] (Treadmill stress test at 9:00 AM. Please arrive about 15 minutes early for paperwork. See instructions.)    Contact information:   [address] [place] [address]       Follow up with Jacolyn Reedy, PA-C On 10/29/2013. (See in followup at 9:45 am)    Specialty:  Cardiology   Contact information:   [address] [place] [address] [phone]       BRING ALL MEDICATIONS WITH YOU TO FOLLOW UP APPOINTMENTS  Time spent with patient to include physician time: 41 min Signed: Darrol Jump, PA-C 10/19/2013, 12:53 PM Co-Sign MD

## 2013-10-19 NOTE — Progress Notes (Signed)
Patient Name: [name] Date of Encounter: 10/19/2013  Active Problems:   HYPERTENSION   CAD (coronary artery disease)   Drug noncompliance   Chest pain   Apical variant hypertrophic cardiomyopathy    Patient Profile: 53 yo male w/ hx poorly controlled hypertension, apical hypertrophic cardiomyopathy, and CAD (Cath 2011), apical hypertrophy, left main normal, LAD distal disease,  first diagonal subtotal occlusion, second diagonal 70% stenosis, ramus intermediate 50-60% stenosis, right coronary artery ostial 50% stenosis and 40 to 50% distal stenosis. He has been managed medically.   SUBJECTIVE: No more chest pain, no shortness of breath. The patient is concerned about his inability to work outdoors in hot weather without getting chest pain, weakness and shortness of breath. He admits to occasionally missing doses of some of his medications.  OBJECTIVE Filed Vitals:   10/18/13 2200 10/18/13 2229 10/18/13 2305 10/19/13 0522  BP: 143/87 145/80 140/97 132/68  Pulse: 52  57 59  Temp:   97.8 F (36.6 C) 97.6 F (36.4 C)  TempSrc:   Oral Oral  Resp: 19  17 16   Height:   5\' 8"  (1.727 m)   Weight:   195 lb (88.451 kg)   SpO2: 99%  100% 98%   No intake or output data in the 24 hours ending 10/19/13 1055 Filed Weights   10/18/13 1907 10/18/13 2305  Weight: 198 lb 8 oz (90.039 kg) 195 lb (88.451 kg)    PHYSICAL EXAM General: Well developed, well nourished, male in no acute distress. Head: Normocephalic, atraumatic.  Neck: Supple without bruits, JVD not elevated. Lungs:  Resp regular and unlabored, CTA. Heart: RRR, S1, S2, no S3, S4, or murmur; no rub. Abdomen: Soft, non-tender, non-distended, BS + x 4.  Extremities: No clubbing, cyanosis, no edema.  Neuro: Alert and oriented X 3. Moves all extremities spontaneously. Psych: Normal affect.  LABS: CBC: Recent Labs  10/18/13 1908  WBC 5.4  NEUTROABS 2.3  HGB 11.2*  HCT 34.4*  MCV 89.8  PLT 243   Basic  Metabolic Panel: Recent Labs  10/18/13 1908  NA 143  K 3.8  CL 104  CO2 27  GLUCOSE 84  BUN 15  CREATININE 1.27  CALCIUM 9.1   Liver Function Tests: Recent Labs  10/18/13 1908  AST 24  ALT 14  ALKPHOS 78  BILITOT 0.4  PROT 7.2  ALBUMIN 4.0   Cardiac Enzymes: Recent Labs  10/18/13 2246 10/19/13 0450  TROPONINI <0.30 <0.30    Recent Labs  10/18/13 1922  TROPIPOC 0.00   TELE: Sinus rhythm, no significant arrhythmia       Radiology/Studies: Dg Chest Portable 1 View  10/18/2013   CLINICAL DATA:  Chest pain.  EXAM: PORTABLE CHEST - 1 VIEW  COMPARISON:  November 07, 2012.  FINDINGS: The heart size and mediastinal contours are within normal limits. Both lungs are clear. No pneumothorax or pleural effusion is noted. The visualized skeletal structures are unremarkable.  IMPRESSION: No acute cardiopulmonary abnormality seen.   Electronically Signed   By: Roque Lias M.D.   On: 10/18/2013 20:10     Current Medications:  . amLODipine  10 mg Oral Daily  . aspirin EC  81 mg Oral Daily  . irbesartan  300 mg Oral Daily  . labetalol  300 mg Oral BID  . simvastatin  40 mg Oral q1800      ASSESSMENT AND PLAN:   Chest pain - reason for admission. Cardiac enzymes negative so far. Discussed issues with  patient regarding chest pain during working outside in the heat. Advised him it was possible his pain was from extremely elevated blood pressure, as it possibly was on admission. Advised him we would look at his medications to see if any of them were contributing to his symptoms. M.D. advised if labetalol or one of his other medications could be contributing.  Active Problems:   HYPERTENSION - reinforced the need for compliance with medications and the importance of blood pressure control.    CAD (coronary artery disease) - multivessel CAD by cath in 2011, treated medically due to most significant stenosis being in smaller vessels.    Drug noncompliance  -  encouraged compliance  with blood pressure medications    Apical variant hypertrophic cardiomyopathy - good blood pressure control is important, see above  Plan - discharge when medically stable. Pt has not eaten today, in case testing is needed.   Signed, Darrol JumpRhonda G Barrett , PA-C 10:55 AM 10/19/2013  Agree with note by Theodore Demarkhonda Barrett PA-C  Pt admitted with atypical CP. Nl cors at cath 2011. Resistant HTN on multiple BP meds. Apical hypertroph. EKG w/o acute changes (LVH with repol ), enz neg. Labs OK.Exam benign.  Pt admits to medicine and dietary indiscretion. OK for DC home. ROV with Dr. Luretha MurphyHS  Runell GessJonathan J. Jacolby Risby, M.D., FACP, [facility] LLCFACC, Earl LagosFAHA, [facility]SC[facility] [address] [place], [place]  [id number]  [phone] 10/19/2013 12:00 PM

## 2013-10-24 ENCOUNTER — Ambulatory Visit (HOSPITAL_COMMUNITY): Payer: BC Managed Care – PPO | Attending: Cardiovascular Disease | Admitting: Radiology

## 2013-10-24 VITALS — BP 148/96 | Ht 68.0 in | Wt 190.0 lb

## 2013-10-24 DIAGNOSIS — I4949 Other premature depolarization: Secondary | ICD-10-CM

## 2013-10-24 DIAGNOSIS — R079 Chest pain, unspecified: Secondary | ICD-10-CM

## 2013-10-24 DIAGNOSIS — R002 Palpitations: Secondary | ICD-10-CM | POA: Insufficient documentation

## 2013-10-24 MED ORDER — REGADENOSON 0.4 MG/5ML IV SOLN
0.4000 mg | Freq: Once | INTRAVENOUS | Status: AC
  Administered 2013-10-24: 0.4 mg via INTRAVENOUS

## 2013-10-24 MED ORDER — TECHNETIUM TC 99M SESTAMIBI GENERIC - CARDIOLITE
33.0000 | Freq: Once | INTRAVENOUS | Status: AC | PRN
  Administered 2013-10-24: 33 via INTRAVENOUS

## 2013-10-24 MED ORDER — TECHNETIUM TC 99M SESTAMIBI GENERIC - CARDIOLITE
10.8000 | Freq: Once | INTRAVENOUS | Status: AC | PRN
  Administered 2013-10-24: 11 via INTRAVENOUS

## 2013-10-24 NOTE — Progress Notes (Signed)
[facility] SITE 3 NUCLEAR MED [address] [place], [place] [address] [phone]    Cardiology Nuclear Med Study  [name] is a 53 y.o. male     MRN : [id number]     DOB: 1960-11-06  Procedure Date: 10/24/2013  Nuclear Med Background Indication for Stress Test:  Evaluation for Ischemia and Post Hospital 5/15 CP, R/O MI History:  CAD-MI-Cath-multivessel Dz-Tx medically, '11 MPI: EF: 58% mid to distal anterior/apical, 07/18/12 ECHO: EF: 65-70% Cardiac Risk Factors: CVA, Family History - CAD, Hypertension, Lipids and Smoker  Symptoms:  Chest Pain and Palpitations   Nuclear Pre-Procedure Caffeine/Decaff Intake:  None NPO After: 8:30pm   Lungs:  clear O2 Sat: 98% on room air. IV 0.9% NS with Angio Cath:  22g  IV Site: R Hand  IV Started by:  Bonnita Levan, RN  Chest Size (in):  48 Cup Size: n/a  Height: 5\' 8"  (1.727 m)  Weight:  190 lb (86.183 kg)  BMI:  Body mass index is 28.9 kg/(m^2). Tech Comments:  N/A    Nuclear Med Study 1 or 2 day study: 1 day  Stress Test Type:  Treadmill/Lexiscan  Reading MD: N/A  Order Authorizing Provider:  Verdis Prime, MD  Resting Radionuclide: Technetium 10m Sestamibi  Resting Radionuclide Dose: 11.0 mCi   Stress Radionuclide:  Technetium 91m Sestamibi  Stress Radionuclide Dose: 33.0 mCi           Stress Protocol Rest HR: 62 Stress HR: 120  Rest BP: 148/96 Stress BP: 199/66  Exercise Time (min): 9:27 METS: 8.70   Predicted Max HR: 168 bpm % Max HR: 71.43 bpm Rate Pressure Product: 21115   Dose of Adenosine (mg):  n/a Dose of Lexiscan: 0.4 mg  Dose of Atropine (mg): n/a Dose of Dobutamine: n/a mcg/kg/min (at max HR)  Stress Test Technologist: Milana Na, EMT-P  Nuclear Technologist:  Domenic Polite, CNMT     Rest Procedure:  Myocardial perfusion imaging was performed at rest 45 minutes following the intravenous administration of Technetium 47m Sestamibi. Rest ECG: SR, early repolarization and negative T waves in  the lateral leads.   Stress Procedure:  The patient received IV Lexiscan 0.4 mg over 15-seconds with concurrent low level exercise and then Technetium 67m Sestamibi was injected at 30-seconds while the patient continued walking one more minute. This patient had chest pain, sob, headache and abdominal pain with the Lexiscan injection.  Quantitative spect images were obtained after a 45-minute delay. Stress ECG: No significant change from baseline ECG  QPS Raw Data Images:  Normal; no motion artifact; normal heart/lung ratio. Stress Images:  Decraesed uptake in the apical inferior, mid and basal inferolateral walls.  Rest Images:  Normal homogeneous uptake in all areas of the myocardium. Subtraction (SDS):  A medium size, moderate severity reversible defect in the apical inferior, mid and basal inferolateral walls.  Transient Ischemic Dilatation (Normal <1.22):  0.93 Lung/Heart Ratio (Normal <0.45):  0.24  Quantitative Gated Spect Images QGS EDV:  124 ml QGS ESV:  48 ml  Impression Exercise Capacity:  Good exercise capacity. BP Response:  Hypertensive blood pressure response. Clinical Symptoms:  Mild chest pain/dyspnea. ECG Impression:  No significant ST segment change suggestive of ischemia. Comparison with Prior Nuclear Study: No images to compare  Overall Impression:  Intermediate risk stress nuclear study with a medium size, moderate severity reversible defect in the PDA territory (SDS 5). .  LV Ejection Fraction: 62%.  LV Wall Motion:  NL LV Function; NL  Wall Motion  Lars MassonKatarina H Maruice Pieroni 10/24/2013

## 2013-10-29 ENCOUNTER — Encounter: Payer: BC Managed Care – PPO | Admitting: Physician Assistant

## 2013-10-29 ENCOUNTER — Encounter (HOSPITAL_COMMUNITY): Payer: Self-pay

## 2013-10-31 ENCOUNTER — Encounter (HOSPITAL_COMMUNITY): Payer: Self-pay

## 2013-10-31 ENCOUNTER — Ambulatory Visit (HOSPITAL_COMMUNITY): Admit: 2013-10-31 | Payer: Self-pay | Admitting: Interventional Cardiology

## 2013-10-31 SURGERY — LEFT HEART CATHETERIZATION WITH CORONARY ANGIOGRAM
Anesthesia: LOCAL

## 2013-11-07 ENCOUNTER — Telehealth: Payer: Self-pay

## 2013-11-07 NOTE — Telephone Encounter (Signed)
Message copied by Jarvis Newcomer on Wed Nov 07, 2013  9:04 AM ------      Message from: Verdis Prime      Created: Wed Oct 31, 2013  5:31 PM       Let him know study abnormal an he needs to be seen in office. ------

## 2013-11-07 NOTE — Telephone Encounter (Signed)
lmom.called to give pt myoview results.pt to keep upcoming appt to discuss results.

## 2013-11-20 ENCOUNTER — Ambulatory Visit (INDEPENDENT_AMBULATORY_CARE_PROVIDER_SITE_OTHER): Payer: BC Managed Care – PPO | Admitting: Interventional Cardiology

## 2013-11-20 ENCOUNTER — Encounter: Payer: Self-pay | Admitting: Interventional Cardiology

## 2013-11-20 VITALS — BP 150/82 | HR 72 | Ht 68.0 in | Wt 197.0 lb

## 2013-11-20 DIAGNOSIS — I209 Angina pectoris, unspecified: Secondary | ICD-10-CM

## 2013-11-20 DIAGNOSIS — R9439 Abnormal result of other cardiovascular function study: Secondary | ICD-10-CM

## 2013-11-20 DIAGNOSIS — I10 Essential (primary) hypertension: Secondary | ICD-10-CM

## 2013-11-20 DIAGNOSIS — I25118 Atherosclerotic heart disease of native coronary artery with other forms of angina pectoris: Secondary | ICD-10-CM

## 2013-11-20 DIAGNOSIS — I2510 Atherosclerotic heart disease of native coronary artery without angina pectoris: Secondary | ICD-10-CM

## 2013-11-20 DIAGNOSIS — Z01812 Encounter for preprocedural laboratory examination: Secondary | ICD-10-CM

## 2013-11-20 LAB — CBC WITH DIFFERENTIAL/PLATELET
BASOS PCT: 0.3 % (ref 0.0–3.0)
Basophils Absolute: 0 10*3/uL (ref 0.0–0.1)
EOS PCT: 2.1 % (ref 0.0–5.0)
Eosinophils Absolute: 0.1 10*3/uL (ref 0.0–0.7)
HEMATOCRIT: 37.8 % — AB (ref 39.0–52.0)
HEMOGLOBIN: 12.5 g/dL — AB (ref 13.0–17.0)
LYMPHS ABS: 1.9 10*3/uL (ref 0.7–4.0)
Lymphocytes Relative: 31.8 % (ref 12.0–46.0)
MCHC: 32.9 g/dL (ref 30.0–36.0)
MCV: 91.2 fl (ref 78.0–100.0)
MONOS PCT: 6.5 % (ref 3.0–12.0)
Monocytes Absolute: 0.4 10*3/uL (ref 0.1–1.0)
NEUTROS ABS: 3.6 10*3/uL (ref 1.4–7.7)
Neutrophils Relative %: 59.3 % (ref 43.0–77.0)
Platelets: 235 10*3/uL (ref 150.0–400.0)
RBC: 4.15 Mil/uL — ABNORMAL LOW (ref 4.22–5.81)
RDW: 12.6 % (ref 11.5–15.5)
WBC: 6 10*3/uL (ref 4.0–10.5)

## 2013-11-20 LAB — BASIC METABOLIC PANEL
BUN: 11 mg/dL (ref 6–23)
CO2: 26 mEq/L (ref 19–32)
Calcium: 9.2 mg/dL (ref 8.4–10.5)
Chloride: 105 mEq/L (ref 96–112)
Creatinine, Ser: 1.2 mg/dL (ref 0.4–1.5)
GFR: 82.51 mL/min (ref >60.00)
GLUCOSE: 77 mg/dL (ref 70–99)
Potassium: 3.6 mEq/L (ref 3.5–5.1)
SODIUM: 138 meq/L (ref 135–145)

## 2013-11-20 LAB — PROTIME-INR
INR: 1.1 ratio — ABNORMAL HIGH (ref 0.8–1.0)
Prothrombin Time: 11.9 s (ref 9.6–13.1)

## 2013-11-20 MED ORDER — RANOLAZINE ER 500 MG PO TB12: 500.0000 mg | ORAL_TABLET | Freq: Two times a day (BID) | ORAL | Status: DC

## 2013-11-20 MED ORDER — NITROGLYCERIN 0.4 MG SL SUBL: 0.4000 mg | SUBLINGUAL_TABLET | SUBLINGUAL | Status: DC | PRN

## 2013-11-20 NOTE — Progress Notes (Signed)
Patient ID: [name], male   DOB: Jul 05, 1960, 53 y.o.   MRN: [id number]    [address] [name], [place]  [address] Phone: [phone] Fax:  [phone]  Date:  11/20/2013   ID:  [name], DOB 1961/03/26, MRN [id number]  PCP:  Sanda Linger, MD   ASSESSMENT:  1. Moderate risk myocardial perfusion study with inferior wall ischemia. 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 Previous documentation of multifocal coronary artery disease by catheterization performed in 2011 3. Hypertension 4. Hyperlipidemia  PLAN:  1. Ranexa 500 mg twice a day 2. Nitroglycerin 0.4 mg sublingually when necessary 3. Left heart catheterization and possible PCI. The procedure and risks of stroke, death, myocardial infarction, emergency surgery, bleeding, among others were discussed in detail with the patient   SUBJECTIVE: [name] is a 53 y.o. male who has a known history of coronary disease and hypertension. In 2011 angiography demonstrated high-grade disease in the diagonal, distal LAD, and moderate distal right coronary disease. Recent hospitalization did not demonstrate any evidence of infarction after presenting with chest pain. He was scheduled for an outpatient nuclear study which was intermediate risk demonstrating inferior wall ischemia. LV function is preserved. In speaking with the patient, he has a difficult time performing his duties at work   [employer] Readings from Last 3 Encounters:  11/20/13 197 lb (89.359 kg)  10/24/13 190 lb (86.183 kg)  10/18/13 195 lb (88.451 kg)     Past Medical History  Diagnosis Date  . CAD (coronary artery disease) 10/12/2011    Mild to mod dz on cath 2011;  Dr Verdis Prime  . Hypertension   . Hyperlipidemia   . HOH (hard of hearing) 07/2011    rgiht ear  . Myocardial infarction     x2; 2 years ago and last year per pt.  . Stroke   . Migraine headache     . Chronic back pain     Current Outpatient Prescriptions  Medication Sig Dispense Refill  . amLODipine (NORVASC) 10 MG tablet Take 10 mg by mouth daily.      Marland Kitchen aspirin EC 325 MG tablet Take 325 mg by mouth daily.      Marland Kitchen labetalol (NORMODYNE) 300 MG tablet Take 300 mg by mouth 2 (two) times daily.      . Multiple Vitamin (MULTIVITAMIN WITH MINERALS) TABS tablet Take 1 tablet by mouth daily.      Marland Kitchen olmesartan (BENICAR) 40 MG tablet Take 40 mg by mouth daily.      . simvastatin (ZOCOR) 40 MG tablet Take 40 mg by mouth daily.       No current facility-administered medications for this visit.    Allergies:    Allergies  Allergen Reactions  . Lisinopril Swelling    Social History:  The patient  reports that he has been smoking Cigarettes.  He has been smoking about 0.50 packs per day. He does not have any smokeless tobacco history on file. He reports that he does not drink alcohol or use illicit drugs.   ROS:  Please see the history of present illness.   Limited activity due to recurring chest discomfort   All other systems reviewed and negative.   OBJECTIVE: VS:  BP 150/82  Pulse 72  Ht 5\' 8"  (1.727 m)  Wt 197 lb (89.359 kg)  BMI 29.96 kg/m2 Well nourished, well developed, in no acute distress, well-developed well-nourished  HEENT: normal Neck: JVD flat. Carotid bruit absent  Cardiac:  normal S1, S2; RRR; no murmur Lungs:  clear to auscultation bilaterally, no wheezing, rhonchi or rales Abd: soft, nontender, no hepatomegaly Ext: Edema absent. Pulses 2+ and symmetric Skin: warm and dry Neuro:  CNs 2-12 intact, no focal abnormalities noted  EKG:  Not repeated  Myocardial perfusion study: 10/25/2013 Overall Impression: Intermediate risk stress nuclear study with a medium size, moderate severity reversible defect in the PDA territory (SDS 5). .  LV Ejection Fraction: 62%. LV Wall Motion: NL LV Function; NL Wall Motion    Signed, Darci NeedleHenry W. B. Smith III, MD 11/20/2013 11:14 AM

## 2013-11-20 NOTE — Patient Instructions (Signed)
Your physician has recommended you make the following change in your medication:  1) START Ranexa 500mg  twice daily An Rx for Nitro-glycerin has been sent to your pharmacy Take all other medication as prescribed  Lab Today: Bmet, Cbc, Pt/Inr  Your physician has requested that you have a cardiac catheterization. Cardiac catheterization is used to diagnose and/or treat various heart conditions. Doctors may recommend this procedure for a number of different reasons. The most common reason is to evaluate chest pain. Chest pain can be a symptom of coronary artery disease (CAD), and cardiac catheterization can show whether plaque is narrowing or blocking your heart's arteries. This procedure is also used to evaluate the valves, as well as measure the blood flow and oxygen levels in different parts of your heart. For further information please visit [email or web address] Please follow instruction sheet, as given.

## 2013-11-21 ENCOUNTER — Other Ambulatory Visit: Payer: Self-pay | Admitting: Interventional Cardiology

## 2013-11-21 ENCOUNTER — Encounter (HOSPITAL_COMMUNITY): Payer: Self-pay | Admitting: Pharmacy Technician

## 2013-11-21 DIAGNOSIS — I209 Angina pectoris, unspecified: Secondary | ICD-10-CM

## 2013-11-22 ENCOUNTER — Ambulatory Visit (HOSPITAL_COMMUNITY)
Admission: RE | Admit: 2013-11-22 | Discharge: 2013-11-22 | Disposition: A | Discharge status: Home / Self Care | Payer: BC Managed Care – PPO | Source: Ambulatory Visit | Attending: Interventional Cardiology | Admitting: Interventional Cardiology

## 2013-11-22 ENCOUNTER — Encounter (HOSPITAL_COMMUNITY)
Admission: RE | Disposition: A | Discharge status: Home / Self Care | Payer: Self-pay | Source: Ambulatory Visit | Attending: Interventional Cardiology

## 2013-11-22 DIAGNOSIS — I2510 Atherosclerotic heart disease of native coronary artery without angina pectoris: Secondary | ICD-10-CM | POA: Diagnosis not present

## 2013-11-22 DIAGNOSIS — G43909 Migraine, unspecified, not intractable, without status migrainosus: Secondary | ICD-10-CM | POA: Insufficient documentation

## 2013-11-22 DIAGNOSIS — M549 Dorsalgia, unspecified: Secondary | ICD-10-CM | POA: Insufficient documentation

## 2013-11-22 DIAGNOSIS — G8929 Other chronic pain: Secondary | ICD-10-CM | POA: Insufficient documentation

## 2013-11-22 DIAGNOSIS — Z7982 Long term (current) use of aspirin: Secondary | ICD-10-CM | POA: Insufficient documentation

## 2013-11-22 DIAGNOSIS — I252 Old myocardial infarction: Secondary | ICD-10-CM | POA: Insufficient documentation

## 2013-11-22 DIAGNOSIS — H9190 Unspecified hearing loss, unspecified ear: Secondary | ICD-10-CM | POA: Insufficient documentation

## 2013-11-22 DIAGNOSIS — E785 Hyperlipidemia, unspecified: Secondary | ICD-10-CM | POA: Insufficient documentation

## 2013-11-22 DIAGNOSIS — Z8673 Personal history of transient ischemic attack (TIA), and cerebral infarction without residual deficits: Secondary | ICD-10-CM | POA: Insufficient documentation

## 2013-11-22 DIAGNOSIS — I10 Essential (primary) hypertension: Secondary | ICD-10-CM | POA: Insufficient documentation

## 2013-11-22 DIAGNOSIS — F17200 Nicotine dependence, unspecified, uncomplicated: Secondary | ICD-10-CM | POA: Insufficient documentation

## 2013-11-22 DIAGNOSIS — I209 Angina pectoris, unspecified: Secondary | ICD-10-CM | POA: Insufficient documentation

## 2013-11-22 HISTORY — PX: LEFT HEART CATHETERIZATION WITH CORONARY ANGIOGRAM: SHX5451

## 2013-11-22 LAB — POCT ACTIVATED CLOTTING TIME
ACTIVATED CLOTTING TIME: 287 s
Activated Clotting Time: 180 seconds

## 2013-11-22 SURGERY — LEFT HEART CATHETERIZATION WITH CORONARY ANGIOGRAM
Anesthesia: LOCAL

## 2013-11-22 MED ORDER — ADENOSINE 12 MG/4ML IV SOLN
16.0000 mL | Freq: Once | INTRAVENOUS | Status: AC
  Administered 2013-11-22: 48 mg via INTRAVENOUS
  Filled 2013-11-22 (×2): qty 16

## 2013-11-22 MED ORDER — ONDANSETRON HCL 4 MG/2ML IJ SOLN: 4.0000 mg | Freq: Four times a day (QID) | INTRAMUSCULAR | Status: DC | PRN

## 2013-11-22 MED ORDER — DIAZEPAM 2 MG PO TABS: 2.0000 mg | ORAL_TABLET | ORAL | Status: DC | PRN

## 2013-11-22 MED ORDER — OXYCODONE-ACETAMINOPHEN 5-325 MG PO TABS
1.0000 | ORAL_TABLET | ORAL | Status: DC | PRN
  Administered 2013-11-22: 2 via ORAL
  Filled 2013-11-22: qty 2

## 2013-11-22 MED ORDER — MIDAZOLAM HCL 2 MG/2ML IJ SOLN
INTRAMUSCULAR | Status: AC
  Filled 2013-11-22: qty 2

## 2013-11-22 MED ORDER — FENTANYL CITRATE 0.05 MG/ML IJ SOLN
INTRAMUSCULAR | Status: AC
  Filled 2013-11-22: qty 2

## 2013-11-22 MED ORDER — ASPIRIN 81 MG PO CHEW
81.0000 mg | CHEWABLE_TABLET | ORAL | Status: AC
  Administered 2013-11-22: 81 mg via ORAL
  Filled 2013-11-22: qty 1

## 2013-11-22 MED ORDER — SODIUM CHLORIDE 0.9 % IV SOLN: 250.0000 mL | INTRAVENOUS | Status: DC | PRN

## 2013-11-22 MED ORDER — HEPARIN (PORCINE) IN NACL 2-0.9 UNIT/ML-% IJ SOLN
INTRAMUSCULAR | Status: AC
  Filled 2013-11-22: qty 1000

## 2013-11-22 MED ORDER — SODIUM CHLORIDE 0.9 % IJ SOLN: 3.0000 mL | INTRAMUSCULAR | Status: DC | PRN

## 2013-11-22 MED ORDER — LIDOCAINE HCL (PF) 1 % IJ SOLN
INTRAMUSCULAR | Status: AC
  Filled 2013-11-22: qty 30

## 2013-11-22 MED ORDER — SODIUM CHLORIDE 0.9 % IV SOLN
INTRAVENOUS | Status: DC
  Administered 2013-11-22: 1000 mL via INTRAVENOUS

## 2013-11-22 MED ORDER — NITROGLYCERIN 0.2 MG/ML ON CALL CATH LAB
INTRAVENOUS | Status: AC
  Filled 2013-11-22: qty 1

## 2013-11-22 MED ORDER — CLOPIDOGREL BISULFATE 75 MG PO TABS: 75.0000 mg | ORAL_TABLET | Freq: Every day | ORAL | Status: DC

## 2013-11-22 MED ORDER — SODIUM CHLORIDE 0.9 % IJ SOLN: 3.0000 mL | Freq: Two times a day (BID) | INTRAMUSCULAR | Status: DC

## 2013-11-22 MED ORDER — ASPIRIN 81 MG PO CHEW
81.0000 mg | CHEWABLE_TABLET | Freq: Every day | ORAL | Status: DC
Start: 2013-11-22 — End: 2013-11-22

## 2013-11-22 MED ORDER — HEPARIN SODIUM (PORCINE) 1000 UNIT/ML IJ SOLN
INTRAMUSCULAR | Status: AC
  Filled 2013-11-22: qty 1

## 2013-11-22 MED ORDER — ADENOSINE 12 MG/4ML IV SOLN
16.0000 mL | Freq: Once | INTRAVENOUS | Status: AC
  Administered 2013-11-22: 48 mg via INTRAVENOUS
  Filled 2013-11-22: qty 16

## 2013-11-22 MED ORDER — SODIUM CHLORIDE 0.9 % IV SOLN
INTRAVENOUS | Status: DC
Start: 2013-11-22 — End: 2013-11-22

## 2013-11-22 MED ORDER — VERAPAMIL HCL 2.5 MG/ML IV SOLN
INTRAVENOUS | Status: AC
  Filled 2013-11-22: qty 2

## 2013-11-22 MED ORDER — ACETAMINOPHEN 325 MG PO TABS: 650.0000 mg | ORAL_TABLET | ORAL | Status: DC | PRN

## 2013-11-22 NOTE — Interval H&P Note (Signed)
Cath Lab Visit (complete for each Cath Lab visit)  Clinical Evaluation Leading to the Procedure:   ACS: no  Non-ACS:    Anginal Classification: CCS III  Anti-ischemic medical therapy: Maximal Therapy (2 or more classes of medications)  Non-Invasive Test Results: Intermediate-risk stress test findings: cardiac mortality 1-3%/year  Prior CABG: No previous CABG      History and Physical Interval Note:  11/22/2013 11:55 AM  [name]  has presented today for surgery, with the diagnosis of abn stress  The various methods of treatment have been discussed with the patient and family. After consideration of risks, benefits and other options for treatment, the patient has consented to  Procedure(s): LEFT HEART CATHETERIZATION WITH CORONARY ANGIOGRAM (N/A) as a surgical intervention .  The patient's history has been reviewed, patient examined, no change in status, stable for surgery.  I have reviewed the patient's chart and labs.  Questions were answered to the patient's satisfaction.     [name]

## 2013-11-22 NOTE — Progress Notes (Signed)
DR Katrinka Blazing NOTIFIED OF B/P AND BRIAN CHECKED CLIENT'S RIGHT RADIAL AT 1800 AND 1830  AND NO CHANGE PER BRIAN; OK TO D/C HOME CLIENT GIVEN ICE PACK TO PUT ON RIGHT RADIAL

## 2013-11-22 NOTE — Progress Notes (Signed)
WHEN TR BAND REMOVED CLIENT NOTED TO HAVE HEMATOMA AT RIGHT RADIAL SITE; HARRIOTT,RN FROM CATH LAB IN AND NOTIFIED DR Katrinka Blazing AND DR Katrinka Blazing IN TO SEE CLIENT; PAIN MED GIVEN AND CLIENT TOOK HOME B/P MEDS PER DR SMITH; RIGHT RADIAL SITE WITH ACE WRAP AND ELEVATED RIGHT WRIST

## 2013-11-22 NOTE — H&P (View-Only) (Signed)
Patient ID: [name], male   DOB: Jul 05, 1960, 53 y.o.   MRN: [id number]    [address] [name], [place]  [address] Phone: [phone] Fax:  [phone]  Date:  11/20/2013   ID:  [name], DOB 1961/03/26, MRN [id number]  PCP:  Sanda Linger, MD   ASSESSMENT:  1. Moderate risk myocardial perfusion study with inferior wall ischemia. 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 Previous documentation of multifocal coronary artery disease by catheterization performed in 2011 3. Hypertension 4. Hyperlipidemia  PLAN:  1. Ranexa 500 mg twice a day 2. Nitroglycerin 0.4 mg sublingually when necessary 3. Left heart catheterization and possible PCI. The procedure and risks of stroke, death, myocardial infarction, emergency surgery, bleeding, among others were discussed in detail with the patient   SUBJECTIVE: [name] is a 53 y.o. male who has a known history of coronary disease and hypertension. In 2011 angiography demonstrated high-grade disease in the diagonal, distal LAD, and moderate distal right coronary disease. Recent hospitalization did not demonstrate any evidence of infarction after presenting with chest pain. He was scheduled for an outpatient nuclear study which was intermediate risk demonstrating inferior wall ischemia. LV function is preserved. In speaking with the patient, he has a difficult time performing his duties at work   [employer] Readings from Last 3 Encounters:  11/20/13 197 lb (89.359 kg)  10/24/13 190 lb (86.183 kg)  10/18/13 195 lb (88.451 kg)     Past Medical History  Diagnosis Date  . CAD (coronary artery disease) 10/12/2011    Mild to mod dz on cath 2011;  Dr Verdis Prime  . Hypertension   . Hyperlipidemia   . HOH (hard of hearing) 07/2011    rgiht ear  . Myocardial infarction     x2; 2 years ago and last year per pt.  . Stroke   . Migraine headache     . Chronic back pain     Current Outpatient Prescriptions  Medication Sig Dispense Refill  . amLODipine (NORVASC) 10 MG tablet Take 10 mg by mouth daily.      Marland Kitchen aspirin EC 325 MG tablet Take 325 mg by mouth daily.      Marland Kitchen labetalol (NORMODYNE) 300 MG tablet Take 300 mg by mouth 2 (two) times daily.      . Multiple Vitamin (MULTIVITAMIN WITH MINERALS) TABS tablet Take 1 tablet by mouth daily.      Marland Kitchen olmesartan (BENICAR) 40 MG tablet Take 40 mg by mouth daily.      . simvastatin (ZOCOR) 40 MG tablet Take 40 mg by mouth daily.       No current facility-administered medications for this visit.    Allergies:    Allergies  Allergen Reactions  . Lisinopril Swelling    Social History:  The patient  reports that he has been smoking Cigarettes.  He has been smoking about 0.50 packs per day. He does not have any smokeless tobacco history on file. He reports that he does not drink alcohol or use illicit drugs.   ROS:  Please see the history of present illness.   Limited activity due to recurring chest discomfort   All other systems reviewed and negative.   OBJECTIVE: VS:  BP 150/82  Pulse 72  Ht 5\' 8"  (1.727 m)  Wt 197 lb (89.359 kg)  BMI 29.96 kg/m2 Well nourished, well developed, in no acute distress, well-developed well-nourished  HEENT: normal Neck: JVD flat. Carotid bruit absent  Cardiac:  normal S1, S2; RRR; no murmur Lungs:  clear to auscultation bilaterally, no wheezing, rhonchi or rales Abd: soft, nontender, no hepatomegaly Ext: Edema absent. Pulses 2+ and symmetric Skin: warm and dry Neuro:  CNs 2-12 intact, no focal abnormalities noted  EKG:  Not repeated  Myocardial perfusion study: 10/25/2013 Overall Impression: Intermediate risk stress nuclear study with a medium size, moderate severity reversible defect in the PDA territory (SDS 5). .  LV Ejection Fraction: 62%. LV Wall Motion: NL LV Function; NL Wall Motion    Signed, Henry W. B. Smith III, MD 11/20/2013 11:14 AM    

## 2013-11-22 NOTE — Discharge Instructions (Signed)
Radial Site Care °Refer to this sheet in the next few weeks. These instructions provide you with information on caring for yourself after your procedure. Your caregiver may also give you more specific instructions. Your treatment has been planned according to current medical practices, but problems sometimes occur. Call your caregiver if you have any problems or questions after your procedure. °HOME CARE INSTRUCTIONS °· You may shower the day after the procedure. Remove the bandage (dressing) and gently wash the site with plain soap and water. Gently pat the site dry. °· Do not apply powder or lotion to the site. °· Do not submerge the affected site in water for 3 to 5 days. °· Inspect the site at least twice daily. °· Do not flex or bend the affected arm for 24 hours. °· No lifting over 5 pounds (2.3 kg) for 5 days after your procedure. °· Do not drive home if you are discharged the same day of the procedure. Have someone else drive you. °· You may drive 24 hours after the procedure unless otherwise instructed by your caregiver. °· Do not operate machinery or power tools for 24 hours. °· A responsible adult should be with you for the first 24 hours after you arrive home. °What to expect: °· Any bruising will usually fade within 1 to 2 weeks. °· Blood that collects in the tissue (hematoma) may be painful to the touch. It should usually decrease in size and tenderness within 1 to 2 weeks. °SEEK IMMEDIATE MEDICAL CARE IF: °· You have unusual pain at the radial site. °· You have redness, warmth, swelling, or pain at the radial site. °· You have drainage (other than a small amount of blood on the dressing). °· You have chills. °· You have a fever or persistent symptoms for more than 72 hours. °· You have a fever and your symptoms suddenly get worse. °· Your arm becomes pale, cool, tingly, or numb. °· You have heavy bleeding from the site. Hold pressure on the site. °Document Released: 06/19/2010 Document Revised:  08/09/2011 Document Reviewed: 06/19/2010 °ExitCare® Patient Information ©2015 [employer]. This information is not intended to replace advice given to you by your health care provider. Make sure you discuss any questions you have with your health care provider. ° °

## 2013-11-22 NOTE — CV Procedure (Signed)
Left [facility]  [name]  53 y.o.  male 03/28/1961  Procedure Date: 11/22/2013 Referring Physician: HWB Leia AlfSmith, III, M.D. Primary Cardiologist:: Same  INDICATIONS: Class 2-3 angina with recent abnormal myocardial perfusion study demonstrating inferior ischemia despite 2 drug medical therapy  PROCEDURE: 1. Left heart catheterization; 2. Coronary angiography; 3. Left ventriculography; 4. Fractional flow reserve PDA; 5. Fractional flow reserve mid ramus intermedius  CONSENT:  The risks, benefits, and details of the procedure were explained in detail to the patient. Risks including death, stroke, heart attack, kidney injury, allergy, limb ischemia, bleeding and radiation injury were discussed.  The patient verbalized understanding and wanted to proceed.  Informed written consent was obtained.  PROCEDURE TECHNIQUE:  After Xylocaine anesthesia a 5 French Slender sheath was placed in the right radial artery with an angiocath and the modified Seldinger technique.  Coronary angiography was done using a 5 F JR 4 and JL 3.5 cm diagnostic catheter.  Left ventriculography was done using the JR 4 catheter and hand injection. 4000 units of IV heparin was administered.  After reviewing the angiography differences noted from 2011 includes essential total occlusion of the apical LAD, and total occlusion of the first diagonal with homocollaterals present. The ramus intermedius branch contains segmental eccentric 40-60% narrowing. The distal RCA/PDA contains eccentric 40-50% narrowing and possibly a napkin ring lesion that could account for abnormality on nuclear perfusion imaging.   An additional 3000 units of heparin was administered. ACT documented to be greater than 280 seconds. We then passed a Verrata fractional flow wire was advanced into the distal RCA and out into the PDA beyond the region of intermediate stenosis. An  adenosine infusion was started. After 120 seconds, the FFR in the PDA 0.91. A JR 4 was used as a guide catheter. Because of intermittent disengagement of the guide catheter a poor wire was used to stabilize the guide position until the flow wire could be advanced into proper position to  We then turned our attention to the ramus intermedius which contains an eccentric 40-60% narrowing. The Verrata fractional flow wire was then advanced across the stenosis an adenosine infusion was restarted. After 1/122 the FFR was 0.93. A 3.5 centimeter EBU guide was used to obtain access to the left coronary. An additional 5000 units of heparin was administered during the left coronary evaluation.       CONTRAST:  Total of 210 cc.  COMPLICATIONS:  None   HEMODYNAMICS:  Aortic pressure 139/84 mmHg; LV pressure 139/6 mmHg; LVEDP 14 mm mercury  ANGIOGRAPHIC DATA:     The left main coronary artery is normal.  The left anterior descending artery is dominant and widely patent. The first diagonal which is relatively small is totally occluded proximally. Left to left collaterals are noted. The apical LAD supplying the inferoapical segment is also totally occluded with homocollaterals noted.  The left circumflex artery is large vessel that trifurcates on the mid to distal lateral lateral wall. The mid vessel before the first branch contains eccentric 40% narrowing.  The ramus intermedius bifurcates into 2 moderate-sized branches. The mid vessel before the bifurcation contains segmental 40-60% narrowing..  The right coronary artery is large and dominant. The PDA origin contains an eccentric 40-50% narrowing which in certain views suggest the possibility of a napkin ring.Marland Kitchen.   PCI RESULTS: FFR was measured in the PDA and was not significant at 0.91. FFR was also measured in the ramus intermedius  and was 0.93.  LEFT VENTRICULOGRAM:  Left ventricular angiogram was done in the 30 RAO projection and revealed revealed  apical mild hypokinesis of very focal region. There is a spatial date LV cavity configuration. EF is 60%.   IMPRESSIONS:  1. Widely patent RCA, circumflex, and LAD proximal vessels. The left main is also widely patent/normal. 2. Total occlusion of the small first diagonal with left to left collaterals. The apical LAD is totally occluded with homocollaterals. 3. Intermediate mid to ramus intermedius stenosis with FFR of 0.93. 4. Intermediate PDA stenosis with FFR of 0.91 5. 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   RECOMMENDATION:  Medical management. Uptitrate Ranexa. Resume typical activity in 72 hours.Marland Kitchen

## 2013-11-26 ENCOUNTER — Other Ambulatory Visit: Payer: Self-pay | Admitting: *Deleted

## 2013-11-26 MED ORDER — LABETALOL HCL 300 MG PO TABS: 300.0000 mg | ORAL_TABLET | Freq: Two times a day (BID) | ORAL | Status: DC

## 2014-02-28 ENCOUNTER — Ambulatory Visit (INDEPENDENT_AMBULATORY_CARE_PROVIDER_SITE_OTHER): Payer: BC Managed Care – PPO | Admitting: Family Medicine

## 2014-02-28 VITALS — BP 132/78 | HR 81 | Temp 99.5°F | Resp 16 | Ht 68.0 in | Wt 194.6 lb

## 2014-02-28 DIAGNOSIS — F17200 Nicotine dependence, unspecified, uncomplicated: Secondary | ICD-10-CM

## 2014-02-28 DIAGNOSIS — J029 Acute pharyngitis, unspecified: Secondary | ICD-10-CM | POA: Diagnosis not present

## 2014-02-28 DIAGNOSIS — J069 Acute upper respiratory infection, unspecified: Secondary | ICD-10-CM

## 2014-02-28 DIAGNOSIS — I25118 Atherosclerotic heart disease of native coronary artery with other forms of angina pectoris: Secondary | ICD-10-CM

## 2014-02-28 DIAGNOSIS — Z720 Tobacco use: Secondary | ICD-10-CM | POA: Diagnosis not present

## 2014-02-28 LAB — POCT INFLUENZA A/B
INFLUENZA B, POC: NEGATIVE
Influenza A, POC: NEGATIVE

## 2014-02-28 MED ORDER — AMOXICILLIN-POT CLAVULANATE 875-125 MG PO TABS: 1.0000 | ORAL_TABLET | Freq: Two times a day (BID) | ORAL | Status: DC

## 2014-02-28 MED ORDER — BENZONATATE 100 MG PO CAPS: 100.0000 mg | ORAL_CAPSULE | Freq: Three times a day (TID) | ORAL | Status: DC | PRN

## 2014-02-28 MED ORDER — IPRATROPIUM BROMIDE 0.03 % NA SOLN: 2.0000 | Freq: Two times a day (BID) | NASAL | Status: DC

## 2014-02-28 NOTE — Patient Instructions (Signed)
Upper Respiratory Infection, Adult An upper respiratory infection (URI) is also sometimes known as the common cold. The upper respiratory tract includes the nose, sinuses, throat, trachea, and bronchi. Bronchi are the airways leading to the lungs. Most people improve within 1 week, but symptoms can last up to 2 weeks. A residual cough may last even longer.  CAUSES Many different viruses can infect the tissues lining the upper respiratory tract. The tissues become irritated and inflamed and often become very moist. Mucus production is also common. A cold is contagious. You can easily spread the virus to others by oral contact. This includes kissing, sharing a glass, coughing, or sneezing. Touching your mouth or nose and then touching a surface, which is then touched by another person, can also spread the virus. SYMPTOMS  Symptoms typically develop 1 to 3 days after you come in contact with a cold virus. Symptoms vary from person to person. They may include:  Runny nose.  Sneezing.  Nasal congestion.  Sinus irritation.  Sore throat.  Loss of voice (laryngitis).  Cough.  Fatigue.  Muscle aches.  Loss of appetite.  Headache.  Low-grade fever. DIAGNOSIS  You might diagnose your own cold based on familiar symptoms, since most people get a cold 2 to 3 times a year. Your caregiver can confirm this based on your exam. Most importantly, your caregiver can check that your symptoms are not due to another disease such as strep throat, sinusitis, pneumonia, asthma, or epiglottitis. Blood tests, throat tests, and X-rays are not necessary to diagnose a common cold, but they may sometimes be helpful in excluding other more serious diseases. Your caregiver will decide if any further tests are required. RISKS AND COMPLICATIONS  You may be at risk for a more severe case of the common cold if you smoke cigarettes, have chronic heart disease (such as heart failure) or lung disease (such as asthma), or if  you have a weakened immune system. The very young and very old are also at risk for more serious infections. Bacterial sinusitis, middle ear infections, and bacterial pneumonia can complicate the common cold. The common cold can worsen asthma and chronic obstructive pulmonary disease (COPD). Sometimes, these complications can require emergency medical care and may be life-threatening. PREVENTION  The best way to protect against getting a cold is to practice good hygiene. Avoid oral or hand contact with people with cold symptoms. Wash your hands often if contact occurs. There is no clear evidence that vitamin C, vitamin E, echinacea, or exercise reduces the chance of developing a cold. However, it is always recommended to get plenty of rest and practice good nutrition. TREATMENT  Treatment is directed at relieving symptoms. There is no cure. Antibiotics are not effective, because the infection is caused by a virus, not by bacteria. Treatment may include:  Increased fluid intake. Sports drinks offer valuable electrolytes, sugars, and fluids.  Breathing heated mist or steam (vaporizer or shower).  Eating chicken soup or other clear broths, and maintaining good nutrition.  Getting plenty of rest.  Using gargles or lozenges for comfort.  Controlling fevers with ibuprofen or acetaminophen as directed by your caregiver.  Increasing usage of your inhaler if you have asthma. Zinc gel and zinc lozenges, taken in the first 24 hours of the common cold, can shorten the duration and lessen the severity of symptoms. Pain medicines may help with fever, muscle aches, and throat pain. A variety of non-prescription medicines are available to treat congestion and runny nose. Your caregiver   can make recommendations and may suggest nasal or lung inhalers for other symptoms.  HOME CARE INSTRUCTIONS   Only take over-the-counter or prescription medicines for pain, discomfort, or fever as directed by your  caregiver.  Use a warm mist humidifier or inhale steam from a shower to increase air moisture. This may keep secretions moist and make it easier to breathe.  Drink enough water and fluids to keep your urine clear or pale yellow.  Rest as needed.  Return to work when your temperature has returned to normal or as your caregiver advises. You may need to stay home longer to avoid infecting others. You can also use a face mask and careful hand washing to prevent spread of the virus. SEEK MEDICAL CARE IF:   After the first few days, you feel you are getting worse rather than better.  You need your caregiver's advice about medicines to control symptoms.  You develop chills, worsening shortness of breath, or brown or red sputum. These may be signs of pneumonia.  You develop yellow or brown nasal discharge or pain in the face, especially when you bend forward. These may be signs of sinusitis.  You develop a fever, swollen neck glands, pain with swallowing, or white areas in the back of your throat. These may be signs of strep throat. SEEK IMMEDIATE MEDICAL CARE IF:   You have a fever.  You develop severe or persistent headache, ear pain, sinus pain, or chest pain.  You develop wheezing, a prolonged cough, cough up blood, or have a change in your usual mucus (if you have chronic lung disease).  You develop sore muscles or a stiff neck. Document Released: 11/10/2000 Document Revised: 08/09/2011 Document Reviewed: 08/22/2013 ExitCare Patient Information 2015 [employer]. This information is not intended to replace advice given to you by your health care provider. Make sure you discuss any questions you have with your health care provider.  

## 2014-02-28 NOTE — Progress Notes (Signed)
Subjective:  This chart was scribed for Nilda SimmerKristi Shakiera Edelson, MD by Evon Slackerrance Branch, ED Scribe. This Patient was seen in room 03 and the patients care was started at 7:17 PM   Patient ID: [name], male    DOB: 1960-12-21, 53 y.o.   MRN: [id number]  02/28/2014  Cough and Congestion   HPI HPI Comments: [name] Polkarnest Wethington is a 53 y.o. male who presents to the [facility] complaining of productive cough of green sputum onset 2 days prior. He states he has associated fever, congestion, headache, and clear rhinorrhea. He states that he feels as if he is coughing up sputum from his chest. He states he has been taking alka seltzer plus with no relief. He states that he has recently been around his wife who recently had similar symptoms.  Denies chills, ear pain, sore throat, SOB when walking, wheezing, n/v/d. He states that he is a current .5 pack per day smoker. He denies recieving a flu shot. He denies have a Hx of pneumonia.    Review of Systems  Constitutional: Positive for fever, diaphoresis and fatigue. Negative for chills.  HENT: Positive for congestion, rhinorrhea and voice change. Negative for ear pain, postnasal drip, sinus pressure, sore throat and trouble swallowing.   Respiratory: Positive for cough. Negative for shortness of breath and wheezing.   Gastrointestinal: Negative for nausea, vomiting, abdominal pain and diarrhea.  Skin: Negative for rash.  Neurological: Positive for headaches. Negative for dizziness.    Past Medical History  Diagnosis Date  . CAD (coronary artery disease) 10/12/2011    Mild to mod dz on cath 2011;  Dr Verdis PrimeHenry Ransome Helwig  . Hypertension   . Hyperlipidemia   . HOH (hard of hearing) 07/2011    rgiht ear  . Myocardial infarction     x2; 2 years ago and last year per pt.  . Stroke   . Migraine headache   . Chronic back pain    Past Surgical History  Procedure Laterality Date  . Inguinal hernia repair     Allergies  Allergen Reactions    . Lisinopril Swelling   Current Outpatient Prescriptions  Medication Sig Dispense Refill  . amLODipine (NORVASC) 10 MG tablet Take 10 mg by mouth daily.      Marland Kitchen. aspirin EC 325 MG tablet Take 325 mg by mouth daily.      Marland Kitchen. labetalol (NORMODYNE) 300 MG tablet Take 1 tablet (300 mg total) by mouth 2 (two) times daily.  60 tablet  3  . Multiple Vitamin (MULTIVITAMIN WITH MINERALS) TABS tablet Take 1 tablet by mouth daily.      . nitroGLYCERIN (NITROSTAT) 0.4 MG SL tablet Place 1 tablet (0.4 mg total) under the tongue every 5 (five) minutes as needed for chest pain.  25 tablet  3  . olmesartan (BENICAR) 40 MG tablet Take 40 mg by mouth daily.      . simvastatin (ZOCOR) 40 MG tablet Take 40 mg by mouth daily.      Marland Kitchen. amoxicillin-clavulanate (AUGMENTIN) 875-125 MG per tablet Take 1 tablet by mouth 2 (two) times daily.  20 tablet  0  . benzonatate (TESSALON) 100 MG capsule Take 1-2 capsules (100-200 mg total) by mouth 3 (three) times daily as needed for cough.  60 capsule  0  . ipratropium (ATROVENT) 0.03 % nasal spray Place 2 sprays into the nose 2 (two) times daily.  30 mL  0  . ranolazine (RANEXA) 500 MG 12 hr tablet Take 1  tablet (500 mg total) by mouth 2 (two) times daily.       No current facility-administered medications for this visit.       Objective:    BP 132/78  Pulse 81  Temp(Src) 99.5 F (37.5 C) (Oral)  Resp 16  Ht 5\' 8"  (1.727 m)  Wt 194 lb 9.6 oz (88.27 kg)  BMI 29.60 kg/m2  SpO2 99%  Physical Exam  Nursing note and vitals reviewed. Constitutional: He is oriented to person, place, and time. He appears well-developed and well-nourished. No distress.  HENT:  Head: Normocephalic and atraumatic.  Right Ear: External ear normal.  Left Ear: External ear normal.  Nose: Nose normal.  Mouth/Throat: Oropharynx is clear and moist.  Eyes: Conjunctivae and EOM are normal. Pupils are equal, round, and reactive to light.  Neck: Normal range of motion. Neck supple. Carotid bruit is  not present. No tracheal deviation present. No thyromegaly present.  Cardiovascular: Normal rate, regular rhythm, normal heart sounds and intact distal pulses.  Exam reveals no gallop and no friction rub.   No murmur heard. Pulmonary/Chest: Effort normal and breath sounds normal. No respiratory distress. He has no wheezes. He has no rales.  Musculoskeletal: Normal range of motion.  Lymphadenopathy:    He has no cervical adenopathy.  Neurological: He is alert and oriented to person, place, and time. No cranial nerve deficit.  Skin: Skin is warm and dry. No rash noted. He is not diaphoretic.  Psychiatric: He has a normal mood and affect. His behavior is normal.    Results for orders placed in visit on 02/28/14  POCT INFLUENZA A/B      Result Value Ref Range   Influenza A, POC Negative     Influenza B, POC Negative         Assessment & Plan:   1. URI (upper respiratory infection)   2. Sorethroat   3. TOBACCO USE   4. Coronary artery disease involving native coronary artery of native heart with other form of angina pectoris     1. URI:  New. Due to smoking history and CAD, will treat with Augmentin, Atrovent nasal spray, Tessalon Perles.  Recommend rest, Tylenol. RTC for acute worsening. 2. Tobacco abuse: persistent; high risk for secondary bacterial infections. 3.  CAD: stable; asymptomatic; will treat infections aggressively.   Meds ordered this encounter  Medications  . ipratropium (ATROVENT) 0.03 % nasal spray    Sig: Place 2 sprays into the nose 2 (two) times daily.    Dispense:  30 mL    Refill:  0  . amoxicillin-clavulanate (AUGMENTIN) 875-125 MG per tablet    Sig: Take 1 tablet by mouth 2 (two) times daily.    Dispense:  20 tablet    Refill:  0  . benzonatate (TESSALON) 100 MG capsule    Sig: Take 1-2 capsules (100-200 mg total) by mouth 3 (three) times daily as needed for cough.    Dispense:  60 capsule    Refill:  0    No Follow-up on file.  I personally  performed the services described in this documentation, which was scribed in my presence.  The recorded information has been reviewed and is accurate.  Nilda Simmer, M.D.  [facility] [address] [place], [place]  [id number] [phone] phone [phone] fax

## 2014-04-30 ENCOUNTER — Ambulatory Visit: Payer: BC Managed Care – PPO | Admitting: Interventional Cardiology

## 2014-05-09 ENCOUNTER — Encounter (HOSPITAL_COMMUNITY): Payer: Self-pay | Admitting: Interventional Cardiology

## 2014-06-04 ENCOUNTER — Ambulatory Visit: Payer: BC Managed Care – PPO | Admitting: Interventional Cardiology

## 2014-06-17 ENCOUNTER — Encounter (HOSPITAL_COMMUNITY): Payer: Self-pay | Admitting: Emergency Medicine

## 2014-06-17 ENCOUNTER — Observation Stay (HOSPITAL_COMMUNITY)
Admission: EM | Admit: 2014-06-17 | Discharge: 2014-06-18 | Disposition: A | Discharge status: Home / Self Care | Payer: BLUE CROSS/BLUE SHIELD | Attending: Emergency Medicine | Admitting: Emergency Medicine

## 2014-06-17 ENCOUNTER — Emergency Department (HOSPITAL_COMMUNITY): Payer: BLUE CROSS/BLUE SHIELD

## 2014-06-17 DIAGNOSIS — I252 Old myocardial infarction: Secondary | ICD-10-CM | POA: Insufficient documentation

## 2014-06-17 DIAGNOSIS — H9190 Unspecified hearing loss, unspecified ear: Secondary | ICD-10-CM | POA: Insufficient documentation

## 2014-06-17 DIAGNOSIS — Z7982 Long term (current) use of aspirin: Secondary | ICD-10-CM | POA: Insufficient documentation

## 2014-06-17 DIAGNOSIS — R0602 Shortness of breath: Secondary | ICD-10-CM | POA: Insufficient documentation

## 2014-06-17 DIAGNOSIS — I2510 Atherosclerotic heart disease of native coronary artery without angina pectoris: Secondary | ICD-10-CM | POA: Insufficient documentation

## 2014-06-17 DIAGNOSIS — Z792 Long term (current) use of antibiotics: Secondary | ICD-10-CM | POA: Insufficient documentation

## 2014-06-17 DIAGNOSIS — I25119 Atherosclerotic heart disease of native coronary artery with unspecified angina pectoris: Secondary | ICD-10-CM | POA: Diagnosis present

## 2014-06-17 DIAGNOSIS — G8929 Other chronic pain: Secondary | ICD-10-CM | POA: Insufficient documentation

## 2014-06-17 DIAGNOSIS — K219 Gastro-esophageal reflux disease without esophagitis: Secondary | ICD-10-CM | POA: Diagnosis present

## 2014-06-17 DIAGNOSIS — Z79899 Other long term (current) drug therapy: Secondary | ICD-10-CM | POA: Diagnosis not present

## 2014-06-17 DIAGNOSIS — E785 Hyperlipidemia, unspecified: Secondary | ICD-10-CM | POA: Diagnosis not present

## 2014-06-17 DIAGNOSIS — I422 Other hypertrophic cardiomyopathy: Secondary | ICD-10-CM

## 2014-06-17 DIAGNOSIS — G43909 Migraine, unspecified, not intractable, without status migrainosus: Secondary | ICD-10-CM | POA: Diagnosis not present

## 2014-06-17 DIAGNOSIS — Z8673 Personal history of transient ischemic attack (TIA), and cerebral infarction without residual deficits: Secondary | ICD-10-CM | POA: Diagnosis not present

## 2014-06-17 DIAGNOSIS — I10 Essential (primary) hypertension: Secondary | ICD-10-CM | POA: Diagnosis present

## 2014-06-17 DIAGNOSIS — Z720 Tobacco use: Secondary | ICD-10-CM | POA: Insufficient documentation

## 2014-06-17 DIAGNOSIS — F17200 Nicotine dependence, unspecified, uncomplicated: Secondary | ICD-10-CM | POA: Diagnosis present

## 2014-06-17 DIAGNOSIS — R079 Chest pain, unspecified: Secondary | ICD-10-CM | POA: Diagnosis present

## 2014-06-17 DIAGNOSIS — R0789 Other chest pain: Secondary | ICD-10-CM | POA: Diagnosis present

## 2014-06-17 DIAGNOSIS — F528 Other sexual dysfunction not due to a substance or known physiological condition: Secondary | ICD-10-CM | POA: Diagnosis present

## 2014-06-17 HISTORY — DX: Other hypertrophic cardiomyopathy: I42.2

## 2014-06-17 LAB — CBC
HCT: 40.3 % (ref 39.0–52.0)
Hemoglobin: 13.6 g/dL (ref 13.0–17.0)
MCH: 29.8 pg (ref 26.0–34.0)
MCHC: 33.7 g/dL (ref 30.0–36.0)
MCV: 88.2 fL (ref 78.0–100.0)
Platelets: 251 10*3/uL (ref 150–400)
RBC: 4.57 MIL/uL (ref 4.22–5.81)
RDW: 12.3 % (ref 11.5–15.5)
WBC: 6.1 10*3/uL (ref 4.0–10.5)

## 2014-06-17 LAB — I-STAT TROPONIN, ED: Troponin i, poc: 0 ng/mL (ref 0.00–0.08)

## 2014-06-17 LAB — BASIC METABOLIC PANEL
ANION GAP: 8 (ref 5–15)
BUN: 14 mg/dL (ref 6–23)
CALCIUM: 9.7 mg/dL (ref 8.4–10.5)
CO2: 29 mmol/L (ref 19–32)
Chloride: 103 mEq/L (ref 96–112)
Creatinine, Ser: 1.48 mg/dL — ABNORMAL HIGH (ref 0.50–1.35)
GFR calc non Af Amer: 53 mL/min — ABNORMAL LOW (ref >90)
GFR, EST AFRICAN AMERICAN: 61 mL/min — AB (ref >90)
GLUCOSE: 73 mg/dL (ref 70–99)
Potassium: 3.7 mmol/L (ref 3.5–5.1)
Sodium: 140 mmol/L (ref 135–145)

## 2014-06-17 LAB — BRAIN NATRIURETIC PEPTIDE: B NATRIURETIC PEPTIDE 5: 9.8 pg/mL (ref 0.0–100.0)

## 2014-06-17 MED ORDER — MORPHINE SULFATE 4 MG/ML IJ SOLN
4.0000 mg | Freq: Once | INTRAMUSCULAR | Status: AC
  Administered 2014-06-17: 4 mg via INTRAVENOUS
  Filled 2014-06-17: qty 1

## 2014-06-17 MED ORDER — NITROGLYCERIN 0.4 MG SL SUBL: 0.4000 mg | SUBLINGUAL_TABLET | SUBLINGUAL | Status: DC | PRN

## 2014-06-17 MED ORDER — GI COCKTAIL ~~LOC~~: 30.0000 mL | Freq: Once | ORAL | Status: DC

## 2014-06-17 NOTE — ED Provider Notes (Signed)
CSN: [id number]     Arrival date & time 06/17/14  2102 History   First MD Initiated Contact with Patient 06/17/14 2115     Chief Complaint  Patient presents with  . Chest Pain     (Consider location/radiation/quality/duration/timing/severity/associated sxs/prior Treatment) HPI Comments: Patient with past medical history of CAD, hypertension, hyperlipidemia, MI, presents to the emergency department with chief complaint of chest pain 2 weeks. He states the symptoms have been intermittent. There are no aggravating factors. There is no exertional component. Patient states that he has had increasingly frequent episodes of squeezing chest pain. He does report associated shortness of breath. He states that this feels similar to when he had to have a heart catheterization. Prior heart catheterization performed by Dr. Verdis Prime, reveals distal disease. Patient takes aspirin daily. States that his pain is 3 out of 10 in the emergency department.  The history is provided by the patient. No language interpreter was used.    Past Medical History  Diagnosis Date  . CAD (coronary artery disease) 10/12/2011    Mild to mod dz on cath 2011;  Dr Verdis Prime  . Hypertension   . Hyperlipidemia   . HOH (hard of hearing) 07/2011    rgiht ear  . Myocardial infarction     x2; 2 years ago and last year per pt.  . Stroke   . Migraine headache   . Chronic back pain    Past Surgical History  Procedure Laterality Date  . Inguinal hernia repair    . Left heart catheterization with coronary angiogram N/A 11/22/2013    Procedure: LEFT HEART CATHETERIZATION WITH CORONARY ANGIOGRAM;  Surgeon: [name], MD;  Location: [facility] CATH LAB;  Service: Cardiovascular;  Laterality: N/A;   Family History  Problem Relation Age of Onset  . Hypertension Mother   . Heart failure Mother   . Hypertension Father   . Colon cancer Father 99  . Colon cancer Paternal Uncle    History  Substance Use Topics  . Smoking  status: Current Every Day Smoker -- 0.50 packs/day    Types: Cigarettes  . Smokeless tobacco: Not on file  . Alcohol Use: No     Comment: monthly    Review of Systems  Constitutional: Negative for fever and chills.  Respiratory: Positive for shortness of breath.   Cardiovascular: Positive for chest pain.  Gastrointestinal: Negative for nausea, vomiting, diarrhea and constipation.  Genitourinary: Negative for dysuria.  All other systems reviewed and are negative.     Allergies  Lisinopril  Home Medications   Prior to Admission medications   Medication Sig Start Date End Date Taking? Authorizing Provider  amLODipine (NORVASC) 10 MG tablet Take 10 mg by mouth daily.   Yes Historical Provider, MD  aspirin EC 325 MG tablet Take 325 mg by mouth daily.   Yes Historical Provider, MD  labetalol (NORMODYNE) 300 MG tablet Take 1 tablet (300 mg total) by mouth 2 (two) times daily. 11/26/13  Yes Lyn Records III, MD  olmesartan (BENICAR) 40 MG tablet Take 40 mg by mouth daily.   Yes Historical Provider, MD  simvastatin (ZOCOR) 40 MG tablet Take 40 mg by mouth daily.   Yes Historical Provider, MD  amoxicillin-clavulanate (AUGMENTIN) 875-125 MG per tablet Take 1 tablet by mouth 2 (two) times daily. 02/28/14   Ethelda Chick, MD  benzonatate (TESSALON) 100 MG capsule Take 1-2 capsules (100-200 mg total) by mouth 3 (three) times daily as needed for cough.  02/28/14   Ethelda Chick, MD  ipratropium (ATROVENT) 0.03 % nasal spray Place 2 sprays into the nose 2 (two) times daily. 02/28/14   Ethelda Chick, MD  nitroGLYCERIN (NITROSTAT) 0.4 MG SL tablet Place 1 tablet (0.4 mg total) under the tongue every 5 (five) minutes as needed for chest pain. 11/20/13   Lyn Records III, MD  ranolazine (RANEXA) 500 MG 12 hr tablet Take 1 tablet (500 mg total) by mouth 2 (two) times daily. 11/20/13   Lyn Records III, MD   BP 170/90 mmHg  Pulse 76  Temp(Src) 97.9 F (36.6 C) (Oral)  Resp 18  SpO2 100% Physical  Exam  Constitutional: He is oriented to person, place, and time. He appears well-developed and well-nourished.  HENT:  Head: Normocephalic and atraumatic.  Eyes: Conjunctivae and EOM are normal. Pupils are equal, round, and reactive to light. Right eye exhibits no discharge. Left eye exhibits no discharge. No scleral icterus.  Neck: Normal range of motion. Neck supple. No JVD present.  Cardiovascular: Normal rate, regular rhythm and normal heart sounds.  Exam reveals no gallop and no friction rub.   No murmur heard. Pulmonary/Chest: Effort normal and breath sounds normal. No respiratory distress. He has no wheezes. He has no rales. He exhibits no tenderness.  Abdominal: Soft. He exhibits no distension and no mass. There is no tenderness. There is no rebound and no guarding.  Musculoskeletal: Normal range of motion. He exhibits no edema or tenderness.  Neurological: He is alert and oriented to person, place, and time.  Skin: Skin is warm and dry.  Psychiatric: He has a normal mood and affect. His behavior is normal. Judgment and thought content normal.  Nursing note and vitals reviewed.   ED Course  Procedures (including critical care time) Labs Review Labs Reviewed  BASIC METABOLIC PANEL - Abnormal; Notable for the following:    Creatinine, Ser 1.48 (*)    GFR calc non Af Amer 53 (*)    GFR calc Af Amer 61 (*)    All other components within normal limits  CBC  BRAIN NATRIURETIC PEPTIDE  I-STAT TROPOININ, ED    Imaging Review Dg Chest 2 View  06/17/2014   CLINICAL DATA:  Intermittent chest pain and shortness of breath  EXAM: CHEST  2 VIEW  COMPARISON:  10/18/2013  FINDINGS: Normal heart size and mediastinal contours. No acute infiltrate or edema. No effusion or pneumothorax. No acute osseous findings.  IMPRESSION: Negative chest.   Electronically Signed   By: Tiburcio Pea M.D.   On: 06/17/2014 21:40     EKG Interpretation None      MDM   Final diagnoses:  Chest pain     Patient with one week of increasingly frequent episodes of squeezing chest pain. No exertional component. Reports associated shortness breath. He did have some chest pain in the emergency department, and was given morphine and nitroglycerin. Initial labs are reassuring. Patient had some distal disease on recent heart catheterization. Will discuss with cardiology fellow. Patient discussed with Dr. Romeo Apple, who agrees with the plan.  dispo per Cardiology.    Roxy Horseman, PA-C 06/18/14 0040  Purvis Sheffield, MD 06/18/14 [phone]

## 2014-06-17 NOTE — ED Notes (Signed)
Pt. reports intermittent left chest pain with SOB onset 2 weeks ago , denies nausea or diaphoresis .

## 2014-06-18 ENCOUNTER — Encounter (HOSPITAL_COMMUNITY): Payer: Self-pay | Admitting: Physician Assistant

## 2014-06-18 DIAGNOSIS — R0602 Shortness of breath: Secondary | ICD-10-CM | POA: Diagnosis not present

## 2014-06-18 DIAGNOSIS — I209 Angina pectoris, unspecified: Secondary | ICD-10-CM

## 2014-06-18 DIAGNOSIS — G8929 Other chronic pain: Secondary | ICD-10-CM | POA: Diagnosis not present

## 2014-06-18 DIAGNOSIS — E785 Hyperlipidemia, unspecified: Secondary | ICD-10-CM

## 2014-06-18 DIAGNOSIS — I25119 Atherosclerotic heart disease of native coronary artery with unspecified angina pectoris: Secondary | ICD-10-CM | POA: Diagnosis present

## 2014-06-18 DIAGNOSIS — I2510 Atherosclerotic heart disease of native coronary artery without angina pectoris: Secondary | ICD-10-CM

## 2014-06-18 DIAGNOSIS — I10 Essential (primary) hypertension: Secondary | ICD-10-CM

## 2014-06-18 DIAGNOSIS — I200 Unstable angina: Secondary | ICD-10-CM

## 2014-06-18 DIAGNOSIS — R079 Chest pain, unspecified: Secondary | ICD-10-CM | POA: Diagnosis not present

## 2014-06-18 LAB — CBC
HCT: 38.5 % — ABNORMAL LOW (ref 39.0–52.0)
Hemoglobin: 12.8 g/dL — ABNORMAL LOW (ref 13.0–17.0)
MCH: 29.4 pg (ref 26.0–34.0)
MCHC: 33.2 g/dL (ref 30.0–36.0)
MCV: 88.5 fL (ref 78.0–100.0)
Platelets: 263 10*3/uL (ref 150–400)
RBC: 4.35 MIL/uL (ref 4.22–5.81)
RDW: 12.3 % (ref 11.5–15.5)
WBC: 6 10*3/uL (ref 4.0–10.5)

## 2014-06-18 LAB — I-STAT TROPONIN, ED: TROPONIN I, POC: 0 ng/mL (ref 0.00–0.08)

## 2014-06-18 LAB — BASIC METABOLIC PANEL
Anion gap: 8 (ref 5–15)
BUN: 14 mg/dL (ref 6–23)
CALCIUM: 9.3 mg/dL (ref 8.4–10.5)
CO2: 26 mmol/L (ref 19–32)
Chloride: 107 mEq/L (ref 96–112)
Creatinine, Ser: 1.38 mg/dL — ABNORMAL HIGH (ref 0.50–1.35)
GFR calc non Af Amer: 57 mL/min — ABNORMAL LOW (ref >90)
GFR, EST AFRICAN AMERICAN: 66 mL/min — AB (ref >90)
Glucose, Bld: 96 mg/dL (ref 70–99)
POTASSIUM: 4 mmol/L (ref 3.5–5.1)
SODIUM: 141 mmol/L (ref 135–145)

## 2014-06-18 LAB — TROPONIN I
Troponin I: <0.03 ng/mL (ref <0.031)
Troponin I: <0.03 ng/mL (ref <0.031)

## 2014-06-18 MED ORDER — AMLODIPINE BESYLATE 10 MG PO TABS
10.0000 mg | ORAL_TABLET | Freq: Every day | ORAL | Status: DC
  Administered 2014-06-18: 10 mg via ORAL
  Filled 2014-06-18: qty 1

## 2014-06-18 MED ORDER — ASPIRIN EC 81 MG PO TBEC: 81.0000 mg | DELAYED_RELEASE_TABLET | Freq: Every day | ORAL | Status: DC

## 2014-06-18 MED ORDER — ACETAMINOPHEN 325 MG PO TABS: 650.0000 mg | ORAL_TABLET | ORAL | Status: DC | PRN

## 2014-06-18 MED ORDER — ENOXAPARIN SODIUM 40 MG/0.4ML ~~LOC~~ SOLN: 40.0000 mg | SUBCUTANEOUS | Status: DC

## 2014-06-18 MED ORDER — LABETALOL HCL 300 MG PO TABS
300.0000 mg | ORAL_TABLET | Freq: Two times a day (BID) | ORAL | Status: DC
  Administered 2014-06-18 (×2): 300 mg via ORAL
  Filled 2014-06-18 (×3): qty 1

## 2014-06-18 MED ORDER — IRBESARTAN 300 MG PO TABS
300.0000 mg | ORAL_TABLET | Freq: Every day | ORAL | Status: DC
  Administered 2014-06-18: 300 mg via ORAL
  Filled 2014-06-18: qty 1
  Filled 2014-06-18: qty 2

## 2014-06-18 MED ORDER — SIMVASTATIN 40 MG PO TABS
40.0000 mg | ORAL_TABLET | Freq: Every day | ORAL | Status: DC
  Administered 2014-06-18: 40 mg via ORAL
  Filled 2014-06-18: qty 1

## 2014-06-18 MED ORDER — NITROGLYCERIN 0.4 MG SL SUBL: 0.4000 mg | SUBLINGUAL_TABLET | SUBLINGUAL | Status: DC | PRN

## 2014-06-18 MED ORDER — RANOLAZINE ER 500 MG PO TB12: 500.0000 mg | ORAL_TABLET | Freq: Two times a day (BID) | ORAL | Status: DC

## 2014-06-18 MED ORDER — ISOSORBIDE MONONITRATE ER 30 MG PO TB24
30.0000 mg | ORAL_TABLET | Freq: Every day | ORAL | Status: DC
  Administered 2014-06-18: 30 mg via ORAL
  Filled 2014-06-18: qty 1

## 2014-06-18 NOTE — ED Notes (Signed)
Cardiology at the bedside.

## 2014-06-18 NOTE — Progress Notes (Signed)
Patient Name: [name] Date of Encounter: 06/18/2014     Active Problems:   Unstable angina    SUBJECTIVE  No further chest pain.   CURRENT MEDS . amLODipine  10 mg Oral Daily  . [START ON 06/19/2014] aspirin EC  81 mg Oral Daily  . enoxaparin (LOVENOX) injection  40 mg Subcutaneous Q24H  . irbesartan  300 mg Oral Daily  . isosorbide mononitrate  30 mg Oral Daily  . labetalol  300 mg Oral BID  . simvastatin  40 mg Oral Daily    OBJECTIVE  Filed Vitals:   06/18/14 0145 06/18/14 0200 06/18/14 0316 06/18/14 0513  BP: 125/66 119/76 126/76 127/78  Pulse: 68 71 62 57  Temp:   98.4 F (36.9 C) 98.5 F (36.9 C)  TempSrc:   Oral Oral  Resp: Height:    (1.727 m)   Weight:   203 lb (92.08 kg)   SpO2: 100% 100% 99% 100%    Intake/Output Summary (Last 24 hours) at 06/18/14 0835 Last data filed at 06/18/14 0600  Gross per 24 hour  Intake      0 ml  Output    400 ml  Net   -400 ml   Filed Weights   06/18/14 0316  Weight: 203 lb (92.08 kg)    PHYSICAL EXAM  General: Pleasant, NAD. Neuro: Alert and oriented X 3. Moves all extremities spontaneously. Psych: Normal affect. HEENT:  Normal  Neck: Supple without bruits or JVD. Lungs:  Resp regular and unlabored, CTA. Heart: RRR no s3, s4, or murmurs. Abdomen: Soft, non-tender, non-distended, BS + x 4.  Extremities: No clubbing, cyanosis or edema. DP/PT/Radials 2+ and equal bilaterally.  Accessory Clinical Findings  CBC  Recent Labs  06/17/14 2110 06/18/14 0334  WBC 6.1 6.0  HGB 13.6 12.8*  HCT 40.3 38.5*  MCV 88.2 88.5  PLT 251 263   Basic Metabolic Panel  Recent Labs  06/17/14 2110 06/18/14 0334  NA 140 141  K 3.7 4.0  CL 103 107  CO2 29 26  GLUCOSE 73 96  BUN 14 14  CREATININE 1.48* 1.38*  CALCIUM 9.7 9.3    Cardiac Enzymes  Recent Labs  06/18/14 0334  TROPONINI <0.03    TELE  NSR  Radiology/Studies  Dg Chest 2 View  06/17/2014   CLINICAL DATA:   Intermittent chest pain and shortness of breath  EXAM: CHEST  2 VIEW  COMPARISON:  10/18/2013  FINDINGS: Normal heart size and mediastinal contours. No acute infiltrate or edema. No effusion or pneumothorax. No acute osseous findings.  IMPRESSION: Negative chest.   Electronically Signed   By: Tiburcio Pea M.D.   On: 06/17/2014 21:40    LHC 10/2013 PCI RESULTS: FFR was measured in the PDA and was not significant at 0.91. FFR was also measured in the ramus intermedius and was 0.93. LEFT VENTRICULOGRAM: Left ventricular angiogram was done in the 30 RAO projection and revealed revealed apical mild hypokinesis of very focal region. There is a spatial date LV cavity configuration. EF is 60%. IMPRESSIONS: 1. Widely patent RCA, circumflex, and LAD proximal vessels. The left main is also widely patent/normal. 2. Total occlusion of the small first diagonal with left to left collaterals. The apical LAD is totally occluded with homocollaterals. 3. Intermediate mid to ramus intermedius stenosis with FFR of 0.93. 4. Intermediate PDA stenosis with FFR of 0.91 5. 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 RECOMMENDATION: Medical management. Uptitrate Ranexa. Resume typical activity in 72 hours.   ASSESSMENT AND PLAN  Blaise Falanga is a 54 y.o. male with a history of CAD s/p MI, distal disease on cath, HTN, tobacco abuse, hyperlipidemia, apical variant ventricular hypertrophy who presented to [facility] on 06/18/2014 with complaints of worsening chest pain consistent with prior angina.  CAD- LHC in 2011 with severe dz in D1/D2 and very distal LAD--> too small for PCI and managed medically.  -- s/p LHC in 10/2013 after an abnormal nuclear study which revealed.  1. Widely patent RCA, circumflex, and LAD proximal vessels. The left main is also widely patent/normal.  2. Total occlusion of the small first diagonal with left to left  collaterals. The apical LAD is totally occluded with homocollaterals.  3. Intermediate mid to ramus intermedius stenosis with FFR of 0.93.  4. Intermediate PDA stenosis with FFR of 0.91  5. 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 -- The chest pain is the same as the chest pain he was having prior to cath in June of last year. He has not been taking Ranexa although it did seem to help in the past.   -- Troponin neg x1. ECG stable from previous.  -- No further chest pain, consider discharging with RX for Ranexa and close hospital follow up.  -- Continue ASA, labetalol 300 mg BID, and statin.   HTN- BP well controlled on amlodipine 10mg , labetalol 300 mg BID and avapro 300mg .  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   Tobacco abuse- counseled on cessation  HLD- continue statin.   Creat- mildly elevated at 1.48 on admission--> now decreased to 1.38.    Billy Fischer PA-C  Pager [phone]  History and all data above reviewed.  Patient examined.  I agree with the findings as above.  He has had no further chest pain this AM and EKG is unchanged.  Enzymes are negative.    The patient exam reveals COR:RRR  ,  Lungs: Clear  ,  Abd: Positive bowel sounds, no rebound no guarding, Ext No edema  .  All available labs, radiology testing, previous records reviewed. Agree with documented assessment and plan. Chest pain:  Seems to be resolved.  Without further EKG changes or enzyme elevation further invasive evaluation is not indicated.  Discussed with wife and patient.  They have not had a prescription or Ranexa and agree to go home with this.  Follow up with Dr. Katrinka Blazing.   Rollene Rotunda  9:57 AM  06/18/2014

## 2014-06-18 NOTE — H&P (Signed)
History and Physical  Patient ID: [name] MRN: [id number], SOB: Oct 19, 1960 54 y.o. Date of Encounter: 06/18/2014, 2:09 AM  Primary Physician: Sanda Linger, MD Primary Cardiologist: Dr. Katrinka Blazing  Chief Complaint: worsening chest pain  HPI: 54 y.o. male w/ PMHx significant for CAD s/p MI, distal disease on cath, HTN, tobacco abuse, hyperlipidemia, apical variant ventricular hypertrophy who presented to [facility] on 06/18/2014 with complaints of worsening chest pain. Pt with baseline chronic angina for which he was seen and evaluated by Dr. Katrinka Blazing in 10/2013 after undergoing MPI which demonstrated inferior ischemia and subsequently had a cath which show:  IMPRESSIONS: 1. Widely patent RCA, circumflex, and LAD proximal vessels. The left main is also widely patent/normal. 2. Total occlusion of the small first diagonal with left to left collaterals. The apical LAD is totally occluded with homocollaterals. 3. Intermediate mid to ramus intermedius stenosis with FFR of 0.93. 4. Intermediate PDA stenosis with FFR of 0.91 5. 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  Based upon this, ranolazine was added to his regimen and he was provided samples though he failed to followup with a prescription.  He reports compliance with the rest of his blood pressure medications.  Over the last 2 weeks, he has noted an increase in his angina (sensation of his heart being squeezed) which is associated with mild SOB. Similar to his prior angina. Reports not taking ranolazine for a long time so this doesn;t necessarily coincide. Reports pain occurs at both rest and with exertion. Not known triggers. With time (10 minutes) pain subsides. Has started carrying his nitro with him due to the increase in symptoms.  EKG revealed NSR with inferior lateral deep T wave inversions though unchanged from 09/2013. CXR was without acute cardiopulmonary  abnormalities. Labs are significant for normal troponin.   Past Medical History  Diagnosis Date  . CAD (coronary artery disease) 10/12/2011    Mild to mod dz on cath 2011;  Dr Verdis Prime  . Hypertension   . Hyperlipidemia   . HOH (hard of hearing) 07/2011    rgiht ear  . Myocardial infarction     x2; 2 years ago and last year per pt.  . Stroke   . Migraine headache   . Chronic back pain      Surgical History:  Past Surgical History  Procedure Laterality Date  . Inguinal hernia repair    . Left heart catheterization with coronary angiogram N/A 11/22/2013    Procedure: LEFT HEART CATHETERIZATION WITH CORONARY ANGIOGRAM;  Surgeon: [name], MD;  [facility] CATH LAB;  Service: Cardiovascular;  Laterality: N/A;     Home Meds: Prior to Admission medications   Medication Sig Start Date End Date Taking? Authorizing Provider  amLODipine (NORVASC) 10 MG tablet Take 10 mg by mouth daily.   Yes Historical Provider, MD  aspirin EC 325 MG tablet Take 325 mg by mouth daily.   Yes Historical Provider, MD  labetalol (NORMODYNE) 300 MG tablet Take 1 tablet (300 mg total) by mouth 2 (two) times daily. 11/26/13  Yes Lyn Records III, MD  olmesartan (BENICAR) 40 MG tablet Take 40 mg by mouth daily.   Yes Historical Provider, MD  simvastatin (ZOCOR) 40 MG tablet Take 40 mg by mouth daily.   Yes Historical Provider, MD  amoxicillin-clavulanate (AUGMENTIN) 875-125 MG per tablet Take 1 tablet by mouth 2 (two) times daily. 02/28/14   Ethelda Chick, MD  benzonatate (TESSALON)  100 MG capsule Take 1-2 capsules (100-200 mg total) by mouth 3 (three) times daily as needed for cough. 02/28/14   Ethelda Chick, MD  ipratropium (ATROVENT) 0.03 % nasal spray Place 2 sprays into the nose 2 (two) times daily. 02/28/14   Ethelda Chick, MD  nitroGLYCERIN (NITROSTAT) 0.4 MG SL tablet Place 1 tablet (0.4 mg total) under the tongue every 5 (five) minutes as needed for chest pain. 11/20/13   Lyn Records III, MD   ranolazine (RANEXA) 500 MG 12 hr tablet Take 1 tablet (500 mg total) by mouth 2 (two) times daily. 11/20/13   [name], MD    Allergies:  Allergies  Allergen Reactions  . Lisinopril Swelling    History   Social History  . Marital Status: Married    Spouse Name: N/A    Number of Children: N/A  . Years of Education: N/A   Occupational History  . Not on file.   Social History Main Topics  . Smoking status: Current Every Day Smoker -- 0.50 packs/day    Types: Cigarettes  . Smokeless tobacco: Not on file  . Alcohol Use: No     Comment: monthly  . Drug Use: No  . Sexual Activity: Yes   Other Topics Concern  . Not on file   Social History Narrative     Family History  Problem Relation Age of Onset  . Hypertension Mother   . Heart failure Mother   . Hypertension Father   . Colon cancer Father 51  . Colon cancer Paternal Uncle     Review of Systems: General: negative for chills, fever, night sweats or weight changes.  Cardiovascular: negative for chest pain, shortness of breath, dyspnea on exertion, edema, orthopnea, palpitations, paroxysmal nocturnal dyspnea  Dermatological: negative for rash Respiratory: negative for cough or wheezing Urologic: negative for hematuria Abdominal: negative for nausea, vomiting, diarrhea, bright red blood per rectum, melena, or hematemesis Neurologic: negative for visual changes, syncope, or dizziness All other systems reviewed and are otherwise negative except as noted above.  Labs:   Lab Results  Component Value Date   WBC 6.1 06/17/2014   HGB 13.6 06/17/2014   HCT 40.3 06/17/2014   MCV 88.2 06/17/2014   PLT 251 06/17/2014    Recent Labs Lab 06/17/14 2110  NA 140  K 3.7  CL 103  CO2 29  BUN 14  CREATININE 1.48*  CALCIUM 9.7  GLUCOSE 73   No results for input(s): CKTOTAL, CKMB, TROPONINI in the last 72 hours. Lab Results  Component Value Date   CHOL 171 07/28/2012   HDL 33.40* 07/28/2012   LDLCALC 123*  07/28/2012   TRIG 74.0 07/28/2012   No results found for: DDIMER  Radiology/Studies:  Dg Chest 2 View  06/17/2014   CLINICAL DATA:  Intermittent chest pain and shortness of breath  EXAM: CHEST  2 VIEW  COMPARISON:  10/18/2013  FINDINGS: Normal heart size and mediastinal contours. No acute infiltrate or edema. No effusion or pneumothorax. No acute osseous findings.  IMPRESSION: Negative chest.   Electronically Signed   By: Tiburcio Pea M.D.   On: 06/17/2014 21:40     EKG: sinus, inferior lateral TWI, unchanged from prior  Physical Exam: Blood pressure 119/76, pulse 71, temperature 97.9 F (36.6 C), temperature source Oral, resp. rate 19, SpO2 100 %. General: Well developed, well nourished, in no acute distress. Head: Normocephalic, atraumatic, sclera non-icteric, nares are without discharge Neck: Supple. Negative for carotid bruits.  JVD not elevated. Lungs: Clear bilaterally to auscultation without wheezes, rales, or rhonchi. Breathing is unlabored. Heart: RRR with S1 S2. No murmurs, rubs, or gallops appreciated. Abdomen: Soft, non-tender, non-distended with normoactive bowel sounds. No rebound/guarding. No obvious abdominal masses. Msk:  Strength and tone appear normal for age. Extremities: No edema. No clubbing or cyanosis. Distal pedal pulses are 2+ and equal bilaterally. Neuro: Alert and oriented X 3. Moves all extremities spontaneously. Psych:  Responds to questions appropriately with a normal affect.   Problem List 1. Increasing angina 2. Known CAD 3. Apical variant LV hypertrophy 4. Tobacco abuse 5. HTN 6. Hyperlipidemia  ASSESSMENT AND PLAN:  54 y.o. male w/ PMHx significant for CAD s/p MI, distal disease on cath, HTN, tobacco abuse, hyperlipidemia, apical variant ventricular hypertrophy who presented to [facility] on 06/18/2014 with complaints of worsening chest pain consistent with prior angina.  Occurring in the setting of being off his ranexa (though has  been off for long period time) and continue tobacco abuse. Could represent progression of his disease vs. Progression of supply demand mismatch from his hypertrophy. At this time, will continue his outpatient regimen of BB, CCB, aspirin and substitute ranexa for ISMN due to cost. Will hold on further anticoagulation unless enzymes turn positive or recurrent symptoms. At this time, reasonable to medically manage and consider invasive strategy if he fails to respond to therapy.   Strongly advocated that he quit smoking. He has tried 3 times in the past. His wife is on board with him quitting. Nicotine replacement could be considered once angina stabilizes.   Continue statin.  NPO just in case further testing required in the AM.  Lovenox prophylaxis dosing. Full code.  Signed, Kaidon Kinker C. MD 06/18/2014, 2:09 AM

## 2014-06-18 NOTE — Care Management Note (Signed)
    Page 1 of 1   06/18/2014     12:12:56 PM CARE MANAGEMENT NOTE 06/18/2014  Patient:  [name], [name]   Account Number:  [id number]  Date Initiated:  06/18/2014  Documentation initiated by:  GRAVES-BIGELOW,Vicki Chaffin  Subjective/Objective Assessment:   Pt admitted for chest pain. Wife at bedside and she provided CM with insurance cards. CM faxed insurance cards to [facility] and they will fax to admitting. Pt uses [facility].     Action/Plan:   Benefits check in process and will make pt aware once completed. Pt may be eligible for co pay card for ranexa.   Anticipated DC Date:  06/19/2014   Anticipated DC Plan:  HOME/SELF CARE      DC Planning Services  CM consult  Medication Assistance      Choice offered to / List presented to:             Status of service:  Completed, signed off Medicare Important Message given?  NO (If response is "NO", the following Medicare IM given date fields will be blank) Date Medicare IM given:   Medicare IM given by:   Date Additional Medicare IM given:   Additional Medicare IM given by:    Discharge Disposition:  HOME W HOME HEALTH SERVICES  Per UR Regulation:  Reviewed for med. necessity/level of care/duration of stay  If discussed at Long Length of Stay Meetings, dates discussed:    Comments:  copay 40.00. Pt was given copay card and medication is available at [facility]. No further needs from CM at this time.

## 2014-06-18 NOTE — Discharge Summary (Signed)
Discharge Summary   Patient ID: [name] MRN: [id number], DOB/AGE: 02-24-1961 54 y.o. Admit date: 06/17/2014 D/C date:     06/18/2014  Primary Cardiologist: Dr. Katrinka Blazing   Principal Problem:   Chest pain, mid sternal Active Problems:   HLD (hyperlipidemia)   TOBACCO USE   HTN (hypertension)   ERECTILE DYSFUNCTION   GERD (gastroesophageal reflux disease)   Apical variant hypertrophic cardiomyopathy   CAD (coronary artery disease)   Admission Dates: 06/17/14-06/18/14 Discharge Diagnosis: Chest pain- ruled out for MI. Placed on Ranexa.   HPI: [name] is a 54 y.o. male with a history of CAD s/p MI, distal disease on cath, HTN, tobacco abuse, hyperlipidemia, apical variant ventricular hypertrophy who presented to [facility] on 06/18/2014 with complaints of worsening chest pain consistent with prior angina.  Pt with baseline chronic angina for which he was seen and evaluated by Dr. Katrinka Blazing in 10/2013 after undergoing MPI which demonstrated inferior ischemia and subsequently had a cath which show: IMPRESSIONS: 1. Widely patent RCA, circumflex, and LAD proximal vessels. The left main is also widely patent/normal. 2. Total occlusion of the small first diagonal with left to left collaterals. The apical LAD is totally occluded with homocollaterals. 3. Intermediate mid to ramus intermedius stenosis with FFR of 0.93. 4. Intermediate PDA stenosis with FFR of 0.91 5. 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 Based upon this, ranolazine was added to his regimen and he was provided samples though he failed to followup with a prescription. He reports compliance with the rest of his blood pressure medications. Over the last 2 weeks, he has noted an increase in his angina (sensation of his heart being squeezed) which is associated with mild SOB. Similar to his prior angina. Reports not taking ranolazine for a long time so this  doesn;t necessarily coincide. Reports pain occurs at both rest and with exertion. Not known triggers. With time (10 minutes) pain subsides. Has started carrying his nitro with him due to the increase in symptoms.   Hospital Course  CAD- LHC in 2011 with severe dz in D1/D2 and very distal LAD--> too small for PCI and managed medically.  -- s/p LHC in 10/2013 after an abnormal nuclear study which revealed. 1. Widely patent RCA, circumflex, and LAD proximal vessels. The left main is also widely patent/normal. 2. Total occlusion of the small first diagonal with left to left collaterals. The apical LAD is totally occluded with homocollaterals. 3. Intermediate mid to ramus intermedius stenosis with FFR of 0.93. 4. Intermediate PDA stenosis with FFR of 0.91 5. 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 -- The chest pain is the same as the chest pain he was having prior to cath in June of last year. He has not been taking Ranexa although it did seem to help in the past.  -- Troponin neg x1. ECG stable from previous.  -- No further chest pain -- Continue ASA, labetalol 300 mg BID, and statin. Will discharge with a prescription for Ranexa and have him seen in the office in the next couple weeks. He has a previously scheduled appointment with Dr. Katrinka Blazing in March.  HTN- BP well controlled on amlodipine , labetalol 300 mg BID and avapro  (olmesartan  at home)  Possible HOCM- apical variant ventricular hypertrophy  -- cont BB.   Tobacco abuse- counseled on cessation  HLD- continue statin.   Creat- mildly elevated at 1.48 on admission-->  now decreased to 1.38.    The patient has had an uncomplicated hospital course and is recovering well. He has been seen by Dr. Antoine Poche today and deemed ready for discharge home. All follow-up appointments have been scheduled. Smoking  cessation was disscussed in length. Discharge medications are listed below.   Discharge Vitals: Blood pressure 143/86, pulse 53, temperature 98.5 F (36.9 C), temperature source Oral, resp. rate 18, height  (1.727 m), weight 203 lb (92.08 kg), SpO2 100 %.  Labs: Lab Results  Component Value Date   WBC 6.0 06/18/2014   HGB 12.8* 06/18/2014   HCT 38.5* 06/18/2014   MCV 88.5 06/18/2014   PLT 263 06/18/2014     Recent Labs Lab 06/18/14 0334  NA 141  K 4.0  CL 107  CO2 26  BUN 14  CREATININE 1.38*  CALCIUM 9.3  GLUCOSE 96    Recent Labs  06/18/14 0334 06/18/14 0828  TROPONINI <0.03 <0.03     Diagnostic Studies/Procedures   Dg Chest 2 View  06/17/2014   CLINICAL DATA:  Intermittent chest pain and shortness of breath  EXAM: CHEST  2 VIEW  COMPARISON:  10/18/2013  FINDINGS: Normal heart size and mediastinal contours. No acute infiltrate or edema. No effusion or pneumothorax. No acute osseous findings.  IMPRESSION: Negative chest.   Electronically Signed   By: Tiburcio Pea M.D.   On: 06/17/2014 21:40    Discharge Medications     Medication List    TAKE these medications        amLODipine 10 MG tablet  Commonly known as:  NORVASC  Take 10 mg by mouth daily.     amoxicillin-clavulanate 875-125 MG per tablet  Commonly known as:  AUGMENTIN  Take 1 tablet by mouth 2 (two) times daily.     aspirin EC 325 MG tablet  Take 325 mg by mouth daily.     benzonatate 100 MG capsule  Commonly known as:  TESSALON  Take 1-2 capsules (100-200 mg total) by mouth 3 (three) times daily as needed for cough.     ipratropium 0.03 % nasal spray  Commonly known as:  ATROVENT  Place 2 sprays into the nose 2 (two) times daily.     labetalol 300 MG tablet  Commonly known as:  NORMODYNE  Take 1 tablet (300 mg total) by mouth 2 (two) times daily.     nitroGLYCERIN 0.4 MG SL tablet  Commonly known as:  NITROSTAT  Place 1 tablet (0.4 mg total) under the tongue every 5 (five)  minutes as needed for chest pain.     olmesartan 40 MG tablet  Commonly known as:  BENICAR  Take 40 mg by mouth daily.        ranolazine 500 MG 12 hr tablet  Commonly known as:  RANEXA  Take 1 tablet (500 mg total) by mouth 2 (two) times daily.     simvastatin 40 MG tablet  Commonly known as:  ZOCOR  Take 40 mg by mouth daily.        Disposition   The patient will be discharged in stable condition to home.  Follow-up Information    Follow up with Abelino Derrick, PA-C On 07/02/2014.   Specialty:  Cardiology   Why:  @ 3pm   Contact information:   [address] [place] [address] [phone]         Duration of Discharge Encounter: Greater than 30 minutes including physician and PA time.  Signed,  Cline Crock R PA-C 06/18/2014, 11:11 AM   Patient seen and examined.  Plan as discussed in my rounding note for today and outlined above. Rollene Rotunda  06/18/2014  12:50 PM

## 2014-06-19 ENCOUNTER — Other Ambulatory Visit: Payer: Self-pay

## 2014-06-19 MED ORDER — ASPIRIN EC 325 MG PO TBEC: 325.0000 mg | DELAYED_RELEASE_TABLET | Freq: Every day | ORAL | Status: DC

## 2014-07-02 ENCOUNTER — Ambulatory Visit (INDEPENDENT_AMBULATORY_CARE_PROVIDER_SITE_OTHER): Payer: BLUE CROSS/BLUE SHIELD | Admitting: Cardiology

## 2014-07-02 ENCOUNTER — Encounter: Payer: Self-pay | Admitting: Cardiology

## 2014-07-02 VITALS — BP 130/82 | HR 71 | Ht 68.0 in | Wt 210.0 lb

## 2014-07-02 DIAGNOSIS — N182 Chronic kidney disease, stage 2 (mild): Secondary | ICD-10-CM | POA: Insufficient documentation

## 2014-07-02 DIAGNOSIS — N189 Chronic kidney disease, unspecified: Secondary | ICD-10-CM

## 2014-07-02 DIAGNOSIS — I10 Essential (primary) hypertension: Secondary | ICD-10-CM

## 2014-07-02 DIAGNOSIS — I422 Other hypertrophic cardiomyopathy: Secondary | ICD-10-CM

## 2014-07-02 DIAGNOSIS — E785 Hyperlipidemia, unspecified: Secondary | ICD-10-CM

## 2014-07-02 MED ORDER — ASPIRIN EC 81 MG PO TBEC: 162.0000 mg | DELAYED_RELEASE_TABLET | Freq: Every day | ORAL | Status: DC

## 2014-07-02 NOTE — Assessment & Plan Note (Signed)
a. LHC in 2011 with severe dz in D1/D2 and very distal LAD--> too small for PCI and managed medically.   b. LHC in 10/2013 after an abnormal nuclear study w/ non-obst dz- medical Rx

## 2014-07-02 NOTE — Progress Notes (Signed)
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 His last cath June 2015 showed total small Dx, PDA, and RI disease. The plan has been for medical Rx. He was recently admitted 06/17/2014 to 06/18/2014 with chest pain. His EKG is abnormal at baseline (LVH with repol changes). He ruled out for an MI. He is in the office today for follow up. He continues to complain of chest discomfort "pinching pain Lt chest". His symptoms are not worse with exertion or associated with dyspnea or radiation.    Current Outpatient Prescriptions  Medication Sig Dispense Refill  . amLODipine (NORVASC) 10 MG tablet Take 10 mg by mouth daily.    Marland Kitchen amoxicillin-clavulanate (AUGMENTIN) 875-125 MG per tablet Take 1 tablet by mouth 2 (two) times daily. 20 tablet 0  . benzonatate (TESSALON) 100 MG capsule Take 1-2 capsules (100-200 mg total) by mouth 3 (three) times daily as needed for cough. 60 capsule 0  . ipratropium (ATROVENT) 0.03 % nasal spray Place 2 sprays into the nose 2 (two) times daily. 30 mL 0  . labetalol (NORMODYNE) 300 MG tablet Take 1 tablet (300 mg total) by mouth 2 (two) times daily. 60 tablet 3  . nitroGLYCERIN (NITROSTAT) 0.4 MG SL tablet Place 1 tablet (0.4 mg total) under the tongue every 5 (five) minutes as needed for chest pain. 25 tablet 3  . olmesartan (BENICAR) 40 MG tablet Take 40 mg by mouth daily.    . ranolazine (RANEXA) 500 MG 12 hr tablet Take 1 tablet (500 mg total) by mouth 2 (two) times daily.    . ranolazine (RANEXA) 500 MG 12 hr tablet Take 1 tablet (500 mg total) by mouth 2 (two) times daily. 60 tablet 11  . simvastatin (ZOCOR) 40 MG tablet Take 40 mg by mouth daily.    Marland Kitchen aspirin EC 81 MG tablet Take 2 tablets (162 mg total) by mouth daily.     No current facility-administered medications for this visit.    Allergies  Allergen  Reactions  . Lisinopril Swelling    History   Social History  . Marital Status: Married    Spouse Name: N/A    Number of Children: N/A  . Years of Education: N/A   Occupational History  . Not on file.   Social History Main Topics  . Smoking status: Current Every Day Smoker -- 0.50 packs/day    Types: Cigarettes  . Smokeless tobacco: Not on file  . Alcohol Use: No     Comment: monthly  . Drug Use: No  . Sexual Activity: Yes   Other Topics Concern  . Not on file   Social History Narrative     Review of Systems: General: negative for chills, fever, night sweats or weight changes.  Cardiovascular: negative for chest pain, dyspnea on exertion, edema, orthopnea, palpitations, paroxysmal nocturnal dyspnea or shortness of breath Dermatological: negative for rash Respiratory: negative for cough or wheezing Urologic: negative for hematuria Abdominal: negative for nausea, vomiting, diarrhea, bright red blood per rectum, melena, or hematemesis Neurologic: negative for visual changes, syncope, or dizziness All other systems reviewed and are otherwise negative except as noted above.    Blood pressure 130/82, pulse 71, height 5\' 8"  (1.727 m), weight 210 lb (95.255 kg).  General appearance: alert, cooperative and no distress Lungs: clear to auscultation bilaterally Heart: regular rate and  rhythm  EKG NSR, Inferior TWI, TWI V3-V6  ASSESSMENT AND PLAN:   Chest pain, atypical Hospitalized 1/18-/19/2016 for chest pain, MI r/o.   CAD (coronary artery disease) a. LHC in 2011 with severe dz in D1/D2 and very distal LAD--> too small for PCI and managed medically.   b. LHC in 10/2013 after an abnormal nuclear study w/ non-obst dz- medical Rx   HLD (hyperlipidemia) On statin   HTN (hypertension) Controlled   Chronic renal insufficiency, stage II (mild) GFR 50-60    PLAN  I di not change Mr Dashner's medication. He should f/u with Dr Katrinka Blazing in 3 months. We discussed  typical symptoms of angina and he understands to call us if he develops this. He will need Lipids and a CMET at f/u.    Chrishawn Kring KPA-C 07/02/2014 3:30 PM

## 2014-07-02 NOTE — Assessment & Plan Note (Signed)
Controlled.  

## 2014-07-02 NOTE — Assessment & Plan Note (Signed)
GFR 50-60

## 2014-07-02 NOTE — Assessment & Plan Note (Signed)
Hospitalized 1/18-/19/2016 for chest pain, MI r/o.

## 2014-07-02 NOTE — Patient Instructions (Signed)
Your physician has recommended you make the following change in your medication:   STOP  Aspirin EC 325 mg  START Aspirin 81 mg two tablets daily  Your physician recommends that you schedule a follow-up appointment in: 3 months with Dr. Katrinka Blazing

## 2014-07-02 NOTE — Assessment & Plan Note (Signed)
On statin.

## 2014-07-18 ENCOUNTER — Other Ambulatory Visit: Payer: Self-pay | Admitting: Interventional Cardiology

## 2014-07-22 ENCOUNTER — Other Ambulatory Visit: Payer: Self-pay | Admitting: *Deleted

## 2014-07-22 ENCOUNTER — Other Ambulatory Visit: Payer: Self-pay

## 2014-07-22 MED ORDER — SIMVASTATIN 40 MG PO TABS: 40.0000 mg | ORAL_TABLET | Freq: Every day | ORAL | Status: DC

## 2014-07-22 MED ORDER — OLMESARTAN MEDOXOMIL 40 MG PO TABS: 40.0000 mg | ORAL_TABLET | Freq: Every day | ORAL | Status: DC

## 2014-07-30 ENCOUNTER — Ambulatory Visit: Payer: Self-pay | Admitting: Interventional Cardiology

## 2014-08-26 ENCOUNTER — Other Ambulatory Visit: Payer: Self-pay | Admitting: Interventional Cardiology

## 2014-08-27 ENCOUNTER — Encounter: Payer: Self-pay | Admitting: Internal Medicine

## 2014-08-27 ENCOUNTER — Ambulatory Visit (INDEPENDENT_AMBULATORY_CARE_PROVIDER_SITE_OTHER): Payer: BLUE CROSS/BLUE SHIELD | Admitting: Internal Medicine

## 2014-08-27 ENCOUNTER — Other Ambulatory Visit (INDEPENDENT_AMBULATORY_CARE_PROVIDER_SITE_OTHER): Payer: BLUE CROSS/BLUE SHIELD

## 2014-08-27 ENCOUNTER — Ambulatory Visit (INDEPENDENT_AMBULATORY_CARE_PROVIDER_SITE_OTHER)
Admission: RE | Admit: 2014-08-27 | Discharge: 2014-08-27 | Disposition: A | Discharge status: Home / Self Care | Payer: BLUE CROSS/BLUE SHIELD | Source: Ambulatory Visit | Attending: Internal Medicine | Admitting: Internal Medicine

## 2014-08-27 VITALS — BP 140/88 | HR 74 | Temp 100.1°F | Resp 16 | Ht 68.0 in | Wt 209.8 lb

## 2014-08-27 DIAGNOSIS — R05 Cough: Secondary | ICD-10-CM

## 2014-08-27 DIAGNOSIS — N182 Chronic kidney disease, stage 2 (mild): Secondary | ICD-10-CM

## 2014-08-27 DIAGNOSIS — N2889 Other specified disorders of kidney and ureter: Secondary | ICD-10-CM

## 2014-08-27 DIAGNOSIS — I10 Essential (primary) hypertension: Secondary | ICD-10-CM

## 2014-08-27 DIAGNOSIS — F17200 Nicotine dependence, unspecified, uncomplicated: Secondary | ICD-10-CM

## 2014-08-27 DIAGNOSIS — J189 Pneumonia, unspecified organism: Secondary | ICD-10-CM | POA: Insufficient documentation

## 2014-08-27 DIAGNOSIS — R059 Cough, unspecified: Secondary | ICD-10-CM

## 2014-08-27 DIAGNOSIS — Z0000 Encounter for general adult medical examination without abnormal findings: Secondary | ICD-10-CM | POA: Diagnosis not present

## 2014-08-27 DIAGNOSIS — Z720 Tobacco use: Secondary | ICD-10-CM

## 2014-08-27 DIAGNOSIS — N189 Chronic kidney disease, unspecified: Secondary | ICD-10-CM

## 2014-08-27 LAB — LIPID PANEL
CHOL/HDL RATIO: 5
Cholesterol: 161 mg/dL (ref 0–200)
HDL: 35.6 mg/dL — AB (ref >39.00)
LDL CALC: 110 mg/dL — AB (ref 0–99)
NonHDL: 125.4
Triglycerides: 77 mg/dL (ref 0.0–149.0)
VLDL: 15.4 mg/dL (ref 0.0–40.0)

## 2014-08-27 LAB — COMPREHENSIVE METABOLIC PANEL
ALBUMIN: 4.5 g/dL (ref 3.5–5.2)
ALT: 14 U/L (ref 0–53)
AST: 20 U/L (ref 0–37)
Alkaline Phosphatase: 73 U/L (ref 39–117)
BUN: 17 mg/dL (ref 6–23)
CO2: 26 meq/L (ref 19–32)
Calcium: 9.7 mg/dL (ref 8.4–10.5)
Chloride: 104 mEq/L (ref 96–112)
Creatinine, Ser: 1.64 mg/dL — ABNORMAL HIGH (ref 0.40–1.50)
GFR: 56.81 mL/min — ABNORMAL LOW (ref >60.00)
GLUCOSE: 93 mg/dL (ref 70–99)
POTASSIUM: 4 meq/L (ref 3.5–5.1)
Sodium: 138 mEq/L (ref 135–145)
Total Bilirubin: 0.9 mg/dL (ref 0.2–1.2)
Total Protein: 8 g/dL (ref 6.0–8.3)

## 2014-08-27 LAB — URINALYSIS, ROUTINE W REFLEX MICROSCOPIC
Bilirubin Urine: NEGATIVE
HGB URINE DIPSTICK: NEGATIVE
Leukocytes, UA: NEGATIVE
Nitrite: NEGATIVE
Specific Gravity, Urine: >=1.03 — AB (ref 1.000–1.030)
Total Protein, Urine: 30 — AB
URINE GLUCOSE: NEGATIVE
Urobilinogen, UA: 0.2 (ref 0.0–1.0)
pH: 6 (ref 5.0–8.0)

## 2014-08-27 LAB — CBC WITH DIFFERENTIAL/PLATELET
Basophils Absolute: 0 10*3/uL (ref 0.0–0.1)
Basophils Relative: 0.5 % (ref 0.0–3.0)
Eosinophils Absolute: 0 10*3/uL (ref 0.0–0.7)
Eosinophils Relative: 0.2 % (ref 0.0–5.0)
HCT: 40.4 % (ref 39.0–52.0)
Hemoglobin: 13.6 g/dL (ref 13.0–17.0)
LYMPHS ABS: 1.2 10*3/uL (ref 0.7–4.0)
Lymphocytes Relative: 22.5 % (ref 12.0–46.0)
MCHC: 33.7 g/dL (ref 30.0–36.0)
MCV: 88.7 fl (ref 78.0–100.0)
Monocytes Absolute: 1 10*3/uL (ref 0.1–1.0)
Monocytes Relative: 18.6 % — ABNORMAL HIGH (ref 3.0–12.0)
Neutro Abs: 3.1 10*3/uL (ref 1.4–7.7)
Neutrophils Relative %: 58.2 % (ref 43.0–77.0)
Platelets: 213 10*3/uL (ref 150.0–400.0)
RBC: 4.55 Mil/uL (ref 4.22–5.81)
RDW: 12.5 % (ref 11.5–15.5)
WBC: 5.4 10*3/uL (ref 4.0–10.5)

## 2014-08-27 LAB — TSH: TSH: 0.47 u[IU]/mL (ref 0.35–4.50)

## 2014-08-27 LAB — PSA: PSA: 1.77 ng/mL (ref 0.10–4.00)

## 2014-08-27 MED ORDER — MOXIFLOXACIN HCL 400 MG PO TABS: 400.0000 mg | ORAL_TABLET | Freq: Every day | ORAL | Status: AC

## 2014-08-27 MED ORDER — VARENICLINE TARTRATE 0.5 MG X 11 & 1 MG X 42 PO MISC: ORAL | Status: DC

## 2014-08-27 MED ORDER — HYDROCOD POLST-CPM POLST ER 10-8 MG PO CP12: 1.0000 | ORAL_CAPSULE | Freq: Two times a day (BID) | ORAL | Status: DC | PRN

## 2014-08-27 MED ORDER — VARENICLINE TARTRATE 1 MG PO TABS: 1.0000 mg | ORAL_TABLET | Freq: Two times a day (BID) | ORAL | Status: DC

## 2014-08-27 NOTE — Assessment & Plan Note (Signed)
Vaccines were reviewed and updated Exam done Labs ordered Pt ed material was given 

## 2014-08-27 NOTE — Assessment & Plan Note (Signed)
CXR is normal - no mass or edema

## 2014-08-27 NOTE — Assessment & Plan Note (Signed)
His BP is well controlled Lytes and renal function are stable 

## 2014-08-27 NOTE — Progress Notes (Signed)
Pre visit review using our clinic review tool, if applicable. No additional management support is needed unless otherwise documented below in the visit note. 

## 2014-08-27 NOTE — Assessment & Plan Note (Signed)
His renal function has declined slightly Will cont to control his BP tightly He will avoid nephrotoxic agents

## 2014-08-27 NOTE — Assessment & Plan Note (Signed)
Will treat the infection with avelox, will control the cough with tussicaps

## 2014-08-27 NOTE — Assessment & Plan Note (Signed)
He tells me that he is ready to quit smoking Will start chantix

## 2014-08-27 NOTE — Patient Instructions (Signed)
Cough, Adult  A cough is a reflex that helps clear your throat and airways. It can help heal the body or may be a reaction to an irritated airway. A cough may only last 2 or 3 weeks (acute) or may last more than 8 weeks (chronic).  CAUSES Acute cough:  Viral or bacterial infections. Chronic cough:  Infections.  Allergies.  Asthma.  Post-nasal drip.  Smoking.  Heartburn or acid reflux.  Some medicines.  Chronic lung problems (COPD).  Cancer. SYMPTOMS   Cough.  Fever.  Chest pain.  Increased breathing rate.  High-pitched whistling sound when breathing (wheezing).  Colored mucus that you cough up (sputum). TREATMENT   A bacterial cough may be treated with antibiotic medicine.  A viral cough must run its course and will not respond to antibiotics.  Your caregiver may recommend other treatments if you have a chronic cough. HOME CARE INSTRUCTIONS   Only take over-the-counter or prescription medicines for pain, discomfort, or fever as directed by your caregiver. Use cough suppressants only as directed by your caregiver.  Use a cold steam vaporizer or humidifier in your bedroom or home to help loosen secretions.  Sleep in a semi-upright position if your cough is worse at night.  Rest as needed.  Stop smoking if you smoke. SEEK IMMEDIATE MEDICAL CARE IF:   You have pus in your sputum.  Your cough starts to worsen.  You cannot control your cough with suppressants and are losing sleep.  You begin coughing up blood.  You have difficulty breathing.  You develop pain which is getting worse or is uncontrolled with medicine.  You have a fever. MAKE SURE YOU:   Understand these instructions.  Will watch your condition.  Will get help right away if you are not doing well or get worse. Document Released: 11/13/2010 Document Revised: 08/09/2011 Document Reviewed: 11/13/2010 [employer] Patient Information 2015 [employer]. This information is not intended  to replace advice given to you by your health care provider. Make sure you discuss any questions you have with your health care provider.  

## 2014-08-27 NOTE — Progress Notes (Signed)
Subjective:    Patient ID: [name], male    DOB: 1960-12-20, 54 y.o.   MRN: [id number]  Cough This is a new problem. The current episode started in the past 7 days. The problem has been unchanged. The problem occurs every few hours. The cough is productive of purulent sputum. Associated symptoms include chills, a fever and a sore throat. Pertinent negatives include no chest pain, ear congestion, ear pain, headaches, heartburn, hemoptysis, myalgias, nasal congestion, postnasal drip, rash, rhinorrhea, shortness of breath, sweats, weight loss or wheezing. Nothing aggravates the symptoms. He has tried nothing for the symptoms. The treatment provided no relief. There is no history of asthma, bronchiectasis, bronchitis, COPD, emphysema, environmental allergies or pneumonia.      Review of Systems  Constitutional: Positive for fever and chills. Negative for weight loss, diaphoresis, activity change, appetite change, fatigue and unexpected weight change.  HENT: Positive for sore throat. Negative for congestion, ear pain, postnasal drip, rhinorrhea, sinus pressure, trouble swallowing and voice change.   Eyes: Negative.   Respiratory: Positive for cough. Negative for hemoptysis, choking, chest tightness, shortness of breath, wheezing and stridor.   Cardiovascular: Negative.  Negative for chest pain, palpitations and leg swelling.  Gastrointestinal: Negative.  Negative for heartburn, nausea, abdominal pain, diarrhea, constipation and blood in stool.  Endocrine: Negative.   Genitourinary: Negative.   Musculoskeletal: Negative.  Negative for myalgias and arthralgias.  Skin: Negative.  Negative for rash.  Allergic/Immunologic: Negative.  Negative for environmental allergies.  Neurological: Negative.  Negative for headaches.  Hematological: Negative.   Psychiatric/Behavioral: Negative.        Objective:   Physical Exam  Constitutional: He is oriented to person, place, and time. He appears  well-developed and well-nourished.  Non-toxic appearance. He does not have a sickly appearance. He does not appear ill. No distress.  HENT:  Head: Normocephalic and atraumatic.  Mouth/Throat: Oropharynx is clear and moist. No oropharyngeal exudate.  Eyes: Conjunctivae are normal. Right eye exhibits no discharge. Left eye exhibits no discharge. No scleral icterus.  Neck: Normal range of motion. Neck supple. No JVD present. No tracheal deviation present. No thyromegaly present.  Cardiovascular: Normal rate, regular rhythm, normal heart sounds and intact distal pulses.  Exam reveals no gallop and no friction rub.   No murmur heard. Pulmonary/Chest: Effort normal and breath sounds normal. No stridor. No respiratory distress. He has no wheezes. He has no rales. He exhibits no tenderness.  Abdominal: Soft. Bowel sounds are normal. He exhibits no distension and no mass. There is no tenderness. There is no rebound and no guarding. Hernia confirmed negative in the right inguinal area and confirmed negative in the left inguinal area.  Genitourinary: Rectum normal, prostate normal, testes normal and penis normal. Rectal exam shows no external hemorrhoid, no internal hemorrhoid, no fissure, no mass, no tenderness and anal tone normal. Guaiac negative stool. Prostate is not enlarged and not tender. Right testis shows no mass, no swelling and no tenderness. Right testis is descended. Left testis shows no mass, no swelling and no tenderness. Left testis is descended. Uncircumcised. No phimosis, paraphimosis, hypospadias, penile erythema or penile tenderness. No discharge found.  Musculoskeletal: Normal range of motion. He exhibits no edema or tenderness.  Lymphadenopathy:    He has no cervical adenopathy.       Right: No inguinal adenopathy present.       Left: No inguinal adenopathy present.  Neurological: He is oriented to person, place, and time.  Skin: Skin is warm  and dry. No rash noted. He is not  diaphoretic. No erythema. No pallor.  Vitals reviewed.    Lab Results  Component Value Date   WBC 6.0 06/18/2014   HGB 12.8* 06/18/2014   HCT 38.5* 06/18/2014   PLT 263 06/18/2014   GLUCOSE 96 06/18/2014   CHOL 171 07/28/2012   TRIG 74.0 07/28/2012   HDL 33.40* 07/28/2012   LDLCALC 123* 07/28/2012   ALT 14 10/18/2013   AST 24 10/18/2013   NA 141 06/18/2014   K 4.0 06/18/2014   CL 107 06/18/2014   CREATININE 1.38* 06/18/2014   BUN 14 06/18/2014   CO2 26 06/18/2014   TSH 2.14 07/28/2012   PSA 1.69 07/28/2012   INR 1.1* 11/20/2013   HGBA1C  09/17/2009    5.1 (NOTE)                                                                       According to the ADA Clinical Practice Recommendations for 2011, when HbA1c is used as a screening test:   >=6.5%   Diagnostic of Diabetes Mellitus           (if abnormal result  is confirmed)  5.7-6.4%   Increased risk of developing Diabetes Mellitus  References:Diagnosis and Classification of Diabetes Mellitus,Diabetes Care,2011,34(Suppl 1):S62-S69 and Standards of Medical Care in         Diabetes - 2011,Diabetes Care,2011,34  (Suppl 1):S11-S61.       Assessment & Plan:

## 2014-08-27 NOTE — Telephone Encounter (Signed)
simvastatin (ZOCOR) 40 MG tablet 90 tablet 0 07/22/2014      Sig - Route:  Take 1 tablet (40 mg total) by mouth at bedtime. - Oral    Class:  Normal    Authorizing Provider:  [name], MD    Ordering User:  Greta Doom

## 2014-09-21 ENCOUNTER — Other Ambulatory Visit: Payer: Self-pay | Admitting: Interventional Cardiology

## 2014-09-30 ENCOUNTER — Ambulatory Visit: Payer: BLUE CROSS/BLUE SHIELD | Admitting: Interventional Cardiology

## 2014-10-05 ENCOUNTER — Encounter (HOSPITAL_COMMUNITY): Payer: Self-pay | Admitting: *Deleted

## 2014-10-05 DIAGNOSIS — H9190 Unspecified hearing loss, unspecified ear: Secondary | ICD-10-CM | POA: Insufficient documentation

## 2014-10-05 DIAGNOSIS — E785 Hyperlipidemia, unspecified: Secondary | ICD-10-CM | POA: Diagnosis not present

## 2014-10-05 DIAGNOSIS — G43909 Migraine, unspecified, not intractable, without status migrainosus: Secondary | ICD-10-CM | POA: Insufficient documentation

## 2014-10-05 DIAGNOSIS — Z7982 Long term (current) use of aspirin: Secondary | ICD-10-CM | POA: Insufficient documentation

## 2014-10-05 DIAGNOSIS — G8929 Other chronic pain: Secondary | ICD-10-CM | POA: Insufficient documentation

## 2014-10-05 DIAGNOSIS — R1013 Epigastric pain: Secondary | ICD-10-CM | POA: Insufficient documentation

## 2014-10-05 DIAGNOSIS — Z720 Tobacco use: Secondary | ICD-10-CM | POA: Insufficient documentation

## 2014-10-05 DIAGNOSIS — Z8673 Personal history of transient ischemic attack (TIA), and cerebral infarction without residual deficits: Secondary | ICD-10-CM | POA: Insufficient documentation

## 2014-10-05 DIAGNOSIS — Z9889 Other specified postprocedural states: Secondary | ICD-10-CM | POA: Insufficient documentation

## 2014-10-05 DIAGNOSIS — Z79899 Other long term (current) drug therapy: Secondary | ICD-10-CM | POA: Diagnosis not present

## 2014-10-05 DIAGNOSIS — I2510 Atherosclerotic heart disease of native coronary artery without angina pectoris: Secondary | ICD-10-CM | POA: Diagnosis not present

## 2014-10-05 DIAGNOSIS — I10 Essential (primary) hypertension: Secondary | ICD-10-CM | POA: Insufficient documentation

## 2014-10-05 LAB — CBC WITH DIFFERENTIAL/PLATELET
Basophils Absolute: 0 10*3/uL (ref 0.0–0.1)
Basophils Relative: 1 % (ref 0–1)
Eosinophils Absolute: 0.3 10*3/uL (ref 0.0–0.7)
Eosinophils Relative: 4 % (ref 0–5)
HCT: 36.1 % — ABNORMAL LOW (ref 39.0–52.0)
HEMOGLOBIN: 11.9 g/dL — AB (ref 13.0–17.0)
Lymphocytes Relative: 36 % (ref 12–46)
Lymphs Abs: 2.4 10*3/uL (ref 0.7–4.0)
MCH: 29.5 pg (ref 26.0–34.0)
MCHC: 33 g/dL (ref 30.0–36.0)
MCV: 89.6 fL (ref 78.0–100.0)
MONO ABS: 0.5 10*3/uL (ref 0.1–1.0)
Monocytes Relative: 8 % (ref 3–12)
NEUTROS ABS: 3.4 10*3/uL (ref 1.7–7.7)
Neutrophils Relative %: 51 % (ref 43–77)
Platelets: 249 10*3/uL (ref 150–400)
RBC: 4.03 MIL/uL — ABNORMAL LOW (ref 4.22–5.81)
RDW: 12.2 % (ref 11.5–15.5)
WBC: 6.6 10*3/uL (ref 4.0–10.5)

## 2014-10-05 LAB — URINALYSIS, ROUTINE W REFLEX MICROSCOPIC
Bilirubin Urine: NEGATIVE
Glucose, UA: NEGATIVE mg/dL
Hgb urine dipstick: NEGATIVE
Ketones, ur: NEGATIVE mg/dL
Leukocytes, UA: NEGATIVE
NITRITE: NEGATIVE
PH: 7 (ref 5.0–8.0)
Protein, ur: NEGATIVE mg/dL
SPECIFIC GRAVITY, URINE: 1.017 (ref 1.005–1.030)
Urobilinogen, UA: 0.2 mg/dL (ref 0.0–1.0)

## 2014-10-05 NOTE — ED Notes (Signed)
Patient presents stating he has been having abd cramping for the past month.  Has not seen his PMD

## 2014-10-06 ENCOUNTER — Encounter (HOSPITAL_COMMUNITY): Payer: Self-pay | Admitting: Radiology

## 2014-10-06 ENCOUNTER — Emergency Department (HOSPITAL_COMMUNITY): Payer: BLUE CROSS/BLUE SHIELD

## 2014-10-06 ENCOUNTER — Emergency Department (HOSPITAL_COMMUNITY)
Admission: EM | Admit: 2014-10-06 | Discharge: 2014-10-06 | Disposition: A | Discharge status: Home / Self Care | Payer: BLUE CROSS/BLUE SHIELD | Attending: Emergency Medicine | Admitting: Emergency Medicine

## 2014-10-06 DIAGNOSIS — R1013 Epigastric pain: Secondary | ICD-10-CM

## 2014-10-06 LAB — COMPREHENSIVE METABOLIC PANEL
ALT: 13 U/L — AB (ref 17–63)
AST: 21 U/L (ref 15–41)
Albumin: 4.1 g/dL (ref 3.5–5.0)
Alkaline Phosphatase: 75 U/L (ref 38–126)
Anion gap: 8 (ref 5–15)
BILIRUBIN TOTAL: 0.4 mg/dL (ref 0.3–1.2)
BUN: 13 mg/dL (ref 6–20)
CHLORIDE: 107 mmol/L (ref 101–111)
CO2: 25 mmol/L (ref 22–32)
Calcium: 9.1 mg/dL (ref 8.9–10.3)
Creatinine, Ser: 1.6 mg/dL — ABNORMAL HIGH (ref 0.61–1.24)
GFR calc Af Amer: 55 mL/min — ABNORMAL LOW (ref >60)
GFR, EST NON AFRICAN AMERICAN: 48 mL/min — AB (ref >60)
Glucose, Bld: 95 mg/dL (ref 70–99)
Potassium: 3.9 mmol/L (ref 3.5–5.1)
Sodium: 140 mmol/L (ref 135–145)
Total Protein: 6.9 g/dL (ref 6.5–8.1)

## 2014-10-06 LAB — LIPASE, BLOOD: Lipase: 30 U/L (ref 22–51)

## 2014-10-06 MED ORDER — IOHEXOL 300 MG/ML  SOLN
25.0000 mL | Freq: Once | INTRAMUSCULAR | Status: AC | PRN
  Administered 2014-10-06: 25 mL via ORAL

## 2014-10-06 MED ORDER — IOHEXOL 300 MG/ML  SOLN
100.0000 mL | Freq: Once | INTRAMUSCULAR | Status: AC | PRN
  Administered 2014-10-06: 100 mL via INTRAVENOUS

## 2014-10-06 NOTE — Discharge Instructions (Signed)
Prilosec 20 mg twice daily for the next 2 weeks.  Follow-up with your primary Dr. if not improving in the next week, and return to the ER if you develop increasing pain, high fever, bloody stool or vomit, or other new and concerning symptoms.   Abdominal Pain Many things can cause abdominal pain. Usually, abdominal pain is not caused by a disease and will improve without treatment. It can often be observed and treated at home. Your health care provider will do a physical exam and possibly order blood tests and X-rays to help determine the seriousness of your pain. However, in many cases, more time must pass before a clear cause of the pain can be found. Before that point, your health care provider may not know if you need more testing or further treatment. HOME CARE INSTRUCTIONS  Monitor your abdominal pain for any changes. The following actions may help to alleviate any discomfort you are experiencing:  Only take over-the-counter or prescription medicines as directed by your health care provider.  Do not take laxatives unless directed to do so by your health care provider.  Try a clear liquid diet (broth, tea, or water) as directed by your health care provider. Slowly move to a bland diet as tolerated. SEEK MEDICAL CARE IF:  You have unexplained abdominal pain.  You have abdominal pain associated with nausea or diarrhea.  You have pain when you urinate or have a bowel movement.  You experience abdominal pain that wakes you in the night.  You have abdominal pain that is worsened or improved by eating food.  You have abdominal pain that is worsened with eating fatty foods.  You have a fever. SEEK IMMEDIATE MEDICAL CARE IF:   Your pain does not go away within 2 hours.  You keep throwing up (vomiting).  Your pain is felt only in portions of the abdomen, such as the right side or the left lower portion of the abdomen.  You pass bloody or black tarry stools. MAKE SURE  YOU:  Understand these instructions.   Will watch your condition.   Will get help right away if you are not doing well or get worse.  Document Released: 02/24/2005 Document Revised: 05/22/2013 Document Reviewed: 01/24/2013 [facility] Patient Information 2015 [place], [place]. This information is not intended to replace advice given to you by your health care provider. Make sure you discuss any questions you have with your health care provider.

## 2014-10-06 NOTE — ED Provider Notes (Signed)
CSN: [id number]     Arrival date & time 10/05/14  2256 History  This chart was scribed for Geoffery Lyons, MD by Evon Slack, ED Scribe. This patient was seen in room A08C/A08C and the patient's care was started at 1:19 AM.      Chief Complaint  Patient presents with  . Abdominal Pain   Patient is a 54 y.o. male presenting with abdominal pain. The history is provided by the patient. No language interpreter was used.  Abdominal Pain Pain location:  Epigastric Pain radiates to:  Does not radiate Pain severity:  Mild Onset quality:  Gradual Duration:  4 weeks Progression:  Worsening Context: not eating and not previous surgeries   Relieved by:  Nothing Worsened by:  Nothing tried Ineffective treatments:  Antacids Associated symptoms: no constipation, no diarrhea, no fever, no hematochezia and no vomiting   Risk factors: has not had multiple surgeries    HPI Comments: [name] is a 54 y.o. male with PMHx of CAD, HTN, who presents to the Emergency Department complaining of worsening epigastric abdominal pain onset 1 month. Pt states that he has tried an antacid with no relief. Pt denies any worsening factors. Pt denies fever, constipation, blood in stool or diarrhea. Pt denies abdominal surgeries. Pt denies Hx of GERD.   Past Medical History  Diagnosis Date  . CAD (coronary artery disease)     a. LHC in 2011 with severe dz in D1/D2 and very distal LAD--> too small for PCI and managed medically. b.  LHC in 10/2013 after an abnormal nuclear study w/ non-obst dz/  . Hypertension   . Hyperlipidemia   . HOH (hard of hearing)     rgiht ear  . Stroke   . Migraine headache   . Chronic back pain   . Apical variant hypertrophic cardiomyopathy    Past Surgical History  Procedure Laterality Date  . Inguinal hernia repair    . Left heart catheterization with coronary angiogram N/A 11/22/2013    Procedure: LEFT HEART CATHETERIZATION WITH CORONARY ANGIOGRAM;  Surgeon: [name],  MD;  Location: [facility] CATH [facility];  Service: Cardiovascular;  Laterality: N/A;   Family History  Problem Relation Age of Onset  . Hypertension Mother   . Heart failure Mother   . Hypertension Father   . Colon cancer Father 52  . Colon cancer Paternal Uncle    History  Substance Use Topics  . Smoking status: Current Every Day Smoker -- 0.50 packs/day    Types: Cigarettes  . Smokeless tobacco: Never Used  . Alcohol Use: No     Comment: monthly    Review of Systems  Constitutional: Negative for fever.  Gastrointestinal: Positive for abdominal pain. Negative for vomiting, diarrhea, constipation and hematochezia.     Allergies  Lisinopril  Home Medications   Prior to Admission medications   Medication Sig Start Date End Date Taking? Authorizing Provider  amLODipine (NORVASC) 10 MG tablet TAKE 1 TABLET BY MOUTH EVERY DAY 07/19/14   Lyn Records, MD  aspirin EC 81 MG tablet Take 2 tablets (162 mg total) by mouth daily. 07/02/14   Abelino Derrick, PA-C  Hydrocod Polst-Chlorphen Polst 10-8 MG CP12 Take 1 tablet by mouth 2 (two) times daily as needed. 08/27/14   Etta Grandchild, MD  labetalol (NORMODYNE) 300 MG tablet TAKE 1 TABLET (300 MG TOTAL) BY MOUTH 2 (TWO) TIMES DAILY. 07/19/14   Lyn Records, MD  olmesartan (BENICAR) 40 MG tablet Take 1  tablet (40 mg total) by mouth daily. 07/22/14   Lyn Records, MD  ranolazine (RANEXA) 500 MG 12 hr tablet Take 1 tablet (500 mg total) by mouth 2 (two) times daily. 11/20/13   Lyn Records, MD  simvastatin (ZOCOR) 40 MG tablet Take 1 tablet (40 mg total) by mouth at bedtime. 07/22/14   Lyn Records, MD  varenicline (CHANTIX CONTINUING MONTH PAK) 1 MG tablet Take 1 tablet (1 mg total) by mouth 2 (two) times daily. 08/27/14   Etta Grandchild, MD  varenicline (CHANTIX STARTING MONTH PAK) 0.5 MG X 11 & 1 MG X 42 tablet Take one 0.5 mg tablet by mouth once daily for 3 days, then increase to one 0.5 mg tablet twice daily for 4 days, then increase to one 1 mg tablet  twice daily. 08/27/14   Etta Grandchild, MD   BP 157/91 mmHg  Pulse 69  Temp(Src) 97.9 F (36.6 C) (Oral)  Resp 14  SpO2 99%   Physical Exam  Constitutional: He is oriented to person, place, and time. He appears well-developed and well-nourished. No distress.  HENT:  Head: Normocephalic and atraumatic.  Eyes: Conjunctivae and EOM are normal.  Neck: Neck supple. No tracheal deviation present.  Cardiovascular: Normal rate.   Pulmonary/Chest: Effort normal. No respiratory distress.  Abdominal: Soft. There is tenderness in the epigastric area. There is no rebound and no guarding.  TTP in epigastrium.   Musculoskeletal: Normal range of motion.  Neurological: He is alert and oriented to person, place, and time.  Skin: Skin is warm and dry.  Psychiatric: He has a normal mood and affect. His behavior is normal.  Nursing note and vitals reviewed.   ED Course  Procedures (including critical care time) DIAGNOSTIC STUDIES: Oxygen Saturation is 99% on RA, normal by my interpretation.    COORDINATION OF CARE: 1:26 AM-Discussed treatment plan with pt at bedside and pt agreed to plan.     Labs Review Labs Reviewed  CBC WITH DIFFERENTIAL/PLATELET - Abnormal; Notable for the following:    RBC 4.03 (*)    Hemoglobin 11.9 (*)    HCT 36.1 (*)    All other components within normal limits  COMPREHENSIVE METABOLIC PANEL - Abnormal; Notable for the following:    Creatinine, Ser 1.60 (*)    ALT 13 (*)    GFR calc non Af Amer 48 (*)    GFR calc Af Amer 55 (*)    All other components within normal limits  URINALYSIS, ROUTINE W REFLEX MICROSCOPIC    Imaging Review No results found.   EKG Interpretation None      MDM   Final diagnoses:  None      Patient presents here with complaints of epigastric discomfort for the past several weeks. His physical examination reveals mild tenderness in the epigastrium with no peritoneal signs. His workup reveals no white count, normal LFTs and  lipase, clear urinalysis, and CT scan reveals no acute pathology. I suspect his symptoms may be a gastritis and will recommend Prilosec and follow-up with his primary doctor. If he is not improving he may need an endoscopy.   I personally performed the services described in this documentation, which was scribed in my presence. The recorded information has been reviewed and is accurate.       Geoffery Lyons, MD 10/06/14 [phone]

## 2014-10-06 NOTE — ED Notes (Signed)
Pt. Left with all belongings and refused wheelchair 

## 2014-10-23 ENCOUNTER — Encounter: Payer: Self-pay | Admitting: Interventional Cardiology

## 2014-12-17 ENCOUNTER — Other Ambulatory Visit: Payer: Self-pay | Admitting: Interventional Cardiology

## 2015-01-08 ENCOUNTER — Encounter (HOSPITAL_COMMUNITY): Payer: Self-pay | Admitting: Emergency Medicine

## 2015-01-08 ENCOUNTER — Emergency Department (HOSPITAL_COMMUNITY)
Admission: EM | Admit: 2015-01-08 | Discharge: 2015-01-08 | Disposition: A | Discharge status: Home / Self Care | Payer: BLUE CROSS/BLUE SHIELD | Attending: Emergency Medicine | Admitting: Emergency Medicine

## 2015-01-08 DIAGNOSIS — Y998 Other external cause status: Secondary | ICD-10-CM | POA: Diagnosis not present

## 2015-01-08 DIAGNOSIS — Z8673 Personal history of transient ischemic attack (TIA), and cerebral infarction without residual deficits: Secondary | ICD-10-CM | POA: Diagnosis not present

## 2015-01-08 DIAGNOSIS — Z7982 Long term (current) use of aspirin: Secondary | ICD-10-CM | POA: Diagnosis not present

## 2015-01-08 DIAGNOSIS — S61411A Laceration without foreign body of right hand, initial encounter: Secondary | ICD-10-CM

## 2015-01-08 DIAGNOSIS — G8929 Other chronic pain: Secondary | ICD-10-CM | POA: Insufficient documentation

## 2015-01-08 DIAGNOSIS — I2510 Atherosclerotic heart disease of native coronary artery without angina pectoris: Secondary | ICD-10-CM | POA: Diagnosis not present

## 2015-01-08 DIAGNOSIS — Y9389 Activity, other specified: Secondary | ICD-10-CM | POA: Insufficient documentation

## 2015-01-08 DIAGNOSIS — G43909 Migraine, unspecified, not intractable, without status migrainosus: Secondary | ICD-10-CM | POA: Insufficient documentation

## 2015-01-08 DIAGNOSIS — E785 Hyperlipidemia, unspecified: Secondary | ICD-10-CM | POA: Diagnosis not present

## 2015-01-08 DIAGNOSIS — Z79899 Other long term (current) drug therapy: Secondary | ICD-10-CM | POA: Insufficient documentation

## 2015-01-08 DIAGNOSIS — Y289XXA Contact with unspecified sharp object, undetermined intent, initial encounter: Secondary | ICD-10-CM | POA: Diagnosis not present

## 2015-01-08 DIAGNOSIS — Z720 Tobacco use: Secondary | ICD-10-CM | POA: Insufficient documentation

## 2015-01-08 DIAGNOSIS — I10 Essential (primary) hypertension: Secondary | ICD-10-CM | POA: Insufficient documentation

## 2015-01-08 DIAGNOSIS — Y9289 Other specified places as the place of occurrence of the external cause: Secondary | ICD-10-CM | POA: Insufficient documentation

## 2015-01-08 MED ORDER — LIDOCAINE-EPINEPHRINE (PF) 2 %-1:200000 IJ SOLN
10.0000 mL | Freq: Once | INTRAMUSCULAR | Status: AC
  Administered 2015-01-08: 10 mL via INTRADERMAL
  Filled 2015-01-08: qty 20

## 2015-01-08 MED ORDER — LIDOCAINE HCL (PF) 1 % IJ SOLN: 5.0000 mL | Freq: Once | INTRAMUSCULAR | Status: DC

## 2015-01-08 NOTE — Discharge Instructions (Signed)
Your blood pressure is elevated tonight. Be sure you are taking your medication and follow up with your doctor for recheck.

## 2015-01-08 NOTE — ED Notes (Signed)
Pt. presents with laceration approx. 1" at right hand sustained this evening from a shop saw , minimal bleeding / dressing applied at triage .

## 2015-01-08 NOTE — ED Provider Notes (Signed)
CSN: [id number]     Arrival date & time 01/08/15  2142 History  This chart was scribed for non-physician practitioner, Kerrie Buffalo, NP, working with Lyndal Pulley, MD, by Ronney Lion, ED Scribe. This patient was seen in room TR03C/TR03C and the patient's care was started at 10:00 PM.    Chief Complaint  Patient presents with  . Laceration   Patient is a 54 y.o. male presenting with skin laceration.  Laceration Location:  Hand Hand laceration location:  Dorsum of R hand Length (cm):  2 Depth:  Through underlying tissue Quality: straight   Bleeding: controlled   Time since incident:  1 hour Laceration mechanism:  Razor (chop saw) Pain details:    Quality:  Aching   Severity:  Moderate   Timing:  Constant   Progression:  Unchanged Foreign body present:  No foreign bodies Relieved by:  Pressure Worsened by:  Nothing tried Ineffective treatments:  None tried Tetanus status:  Up to date  HPI Comments: [name] is a 54 y.o. male who presents to the Emergency Department complaining of a laceration to his right hand that occurred about 1 hour ago. Patient states he was using a chop saw at home when he accidentally cut his right hand. He reports "a little" bleeding. Patient states his tetanus vaccine is UTD. He reports no other complaints at this time.   Past Medical History  Diagnosis Date  . CAD (coronary artery disease)     a. LHC in 2011 with severe dz in D1/D2 and very distal LAD--> too small for PCI and managed medically. b.  LHC in 10/2013 after an abnormal nuclear study w/ non-obst dz/  . Hypertension   . Hyperlipidemia   . HOH (hard of hearing)     rgiht ear  . Stroke   . Migraine headache   . Chronic back pain   . Apical variant hypertrophic cardiomyopathy    Past Surgical History  Procedure Laterality Date  . Inguinal hernia repair    . Left heart catheterization with coronary angiogram N/A 11/22/2013    Procedure: LEFT HEART CATHETERIZATION WITH CORONARY  ANGIOGRAM;  Surgeon: [name], MD;  Location: [facility] CATH LAB;  Service: Cardiovascular;  Laterality: N/A;   Family History  Problem Relation Age of Onset  . Hypertension Mother   . Heart failure Mother   . Hypertension Father   . Colon cancer Father 34  . Colon cancer Paternal Uncle    Social History  Substance Use Topics  . Smoking status: Current Every Day Smoker -- 0.00 packs/day    Types: Cigarettes  . Smokeless tobacco: Never Used  . Alcohol Use: No    Review of Systems  Skin: Positive for wound (laceration to right hand).  All other systems reviewed and are negative.   Allergies  Lisinopril  Home Medications   Prior to Admission medications   Medication Sig Start Date End Date Taking? Authorizing Provider  amLODipine (NORVASC) 10 MG tablet TAKE 1 TABLET BY MOUTH EVERY DAY 07/19/14   Lyn Records, MD  aspirin EC 81 MG tablet Take 2 tablets (162 mg total) by mouth daily. 07/02/14   Abelino Derrick, PA-C  Hydrocod Polst-Chlorphen Polst 10-8 MG CP12 Take 1 tablet by mouth 2 (two) times daily as needed. Patient not taking: Reported on 10/06/2014 08/27/14   Etta Grandchild, MD  labetalol (NORMODYNE) 300 MG tablet TAKE 1 TABLET (300 MG TOTAL) BY MOUTH 2 (TWO) TIMES DAILY. 07/19/14   Barry Dienes  Smith, MD  olmesartaKatrinka Blazing(BENICAR) 40 MG tablet Take 1 tablet (40 mg total) by mouth daily. 07/22/14   Lyn Records, MD  ranolazine (RANEXA) 500 MG 12 hr tablet Take 1 tablet (500 mg total) by mouth 2 (two) times daily. 11/20/13   Lyn Records, MD  simvastatin (ZOCOR) 40 MG tablet TAKE 1 TABLET BY MOUTH EVERY DAY AT BEDTIME 12/17/14   Lyn Records, MD  varenicline (CHANTIX CONTINUING MONTH PAK) 1 MG tablet Take 1 tablet (1 mg total) by mouth 2 (two) times daily. 08/27/14   Etta Grandchild, MD  varenicline (CHANTIX STARTING MONTH PAK) 0.5 MG X 11 & 1 MG X 42 tablet Take one 0.5 mg tablet by mouth once daily for 3 days, then increase to one 0.5 mg tablet twice daily for 4 days, then increase to one 1  mg tablet twice daily. 08/27/14   Etta Grandchild, MD   BP 165/88 mmHg  Pulse 61  Temp(Src) 98.2 F (36.8 C) (Oral)  Resp 20  SpO2 100% Physical Exam  Constitutional: He is oriented to person, place, and time. He appears well-developed and well-nourished. No distress.  HENT:  Head: Normocephalic and atraumatic.  Eyes: Conjunctivae and EOM are normal.  Neck: Neck supple. No tracheal deviation present.  Cardiovascular: Normal rate.   Pulses:      Radial pulses are 2+ on the right side, and 2+ on the left side.  Pulmonary/Chest: Effort normal. No respiratory distress.  Musculoskeletal: Normal range of motion.  Radial pulse 2+. 5/5 strength. 2 cm laceration to the dorsum of the right hand at the base of the thumb.  Neurological: He is alert and oriented to person, place, and time.  Skin: Skin is warm and dry.  Psychiatric: He has a normal mood and affect. His behavior is normal.  Nursing note and vitals reviewed.   ED Course  Procedures (including critical care time)  DIAGNOSTIC STUDIES: Oxygen Saturation is 100% on RA, normal by my interpretation.    COORDINATION OF CARE: 10:00 PM - Discussed treatment plan with pt at bedside which includes laceration repair, and pt agreed to plan.  LACERATION REPAIR PROCEDURE NOTE The patient's identification was confirmed and consent was obtained. This procedure was performed by Kerrie Buffalo, NP, at 10:05 PM. Site: Dorsum of right hand Sterile procedures observed Anesthetic used (type and amt): lidocaine 1% with epi, 1 cc Suture type/size: 4-0 prolene Length: 2 cm # of Sutures: 5 Technique: simple interrupted Complexity Antibx ointment applied Tetanus UTD Site anesthetized, irrigated with NS, explored without evidence of foreign body, wound well approximated, site covered with dry, sterile dressing.  Patient tolerated procedure well without complications. Instructions for care discussed verbally and patient provided with additional  written instructions for homecare and f/u.      MDM  54 y.o. male with laceration of the right hand. Stable for d/c without focal neuro deficits. Discussed plan of care and questions answered. Patient will follow up in one week for suture removal or sooner for any problems.   Final diagnoses:  Laceration of hand, right, initial encounter    I personally performed the services described in this documentation, which was scribed in my presence. The recorded information has been reviewed and is accurate.     [address] [place], NP 01/09/15 2359  Lyndal Pulley, MD 01/10/15 [phone]

## 2015-06-01 HISTORY — PX: CORONARY ANGIOPLASTY WITH STENT PLACEMENT: SHX49

## 2015-06-11 ENCOUNTER — Other Ambulatory Visit: Payer: Self-pay | Admitting: Interventional Cardiology

## 2015-07-02 ENCOUNTER — Other Ambulatory Visit: Payer: Self-pay | Admitting: Physician Assistant

## 2015-07-16 NOTE — Progress Notes (Signed)
This encounter was created in error - please disregard.

## 2015-07-17 ENCOUNTER — Encounter: Payer: BLUE CROSS/BLUE SHIELD | Admitting: Cardiology

## 2015-07-17 NOTE — Progress Notes (Signed)
Cardiology Office Note:    Date:  07/18/2015   ID:  [name], DOB 22-Jun-1960, MRN [id number]  PCP:  Sanda Linger, MD  Cardiologist:  Dr. Verdis Prime   Electrophysiologist:  n/a  Chief Complaint  Patient presents with  . Coronary Artery Disease    follow up  . Chest Pain  . Shortness of Breath    History of Present Illness:     [name] is a 55 y.o. male with a hx of CAD, apical variant hypertrophic cardiomyopathy, HTN, HL, prior stroke, CKD stage II. Cardiac catheterization 2011 demonstrated severe disease in the D1 and D2 and very distal LAD. Vessels were too small for PCI manage medically. LHC in 2015 demonstrated D1 and apical LAD were occluded. Intermediate RI and intermediate PDA stenoses were noted. Lesions were insignificant by FFR. Medical therapy was continued. Patient was evaluated in the hospital 1/16 for chest pain. He ruled out for myocardial infarction. Last seen in this office by Corine Shelter, PA-C 2/16.     Return for FU.  He returns for follow-up. He continues to have occasional chest discomfort described as pressure. This sometimes comes on at rest. He can reproduce it with exertion. He has associated dyspnea. Denies any radiating symptoms, nausea or diaphoresis. Denies syncope or near syncope. Denies orthopnea, PND or edema. Overall, he describes CCS class II-III symptoms. He notes that his chest symptoms are stable and have not significantly changed over the past 2-3 years. His blood pressures at home have been uncontrolled.     Past Medical History  Diagnosis Date  . CAD (coronary artery disease)     a. LHC in 2011 with severe dz in D1/D2 and very distal LAD--> too small for PCI and managed medically. b.  LHC in 10/2013 after an abnormal nuclear study w/ non-obst dz/  . Hypertension   . Hyperlipidemia   . HOH (hard of hearing)     rgiht ear  . Stroke (HCC)   . Migraine headache   . Chronic back pain   . Apical variant hypertrophic cardiomyopathy  [facility])     Past Surgical History  Procedure Laterality Date  . Inguinal hernia repair    . Left heart catheterization with coronary angiogram N/A 11/22/2013    Procedure: LEFT HEART CATHETERIZATION WITH CORONARY ANGIOGRAM;  Surgeon: [name], MD;  Location: [facility] CATH LAB;  Service: Cardiovascular;  Laterality: N/A;    Current Medications: Outpatient Prescriptions Prior to Visit  Medication Sig Dispense Refill  . amLODipine (NORVASC) 10 MG tablet TAKE 1 TABLET BY MOUTH EVERY DAY 30 tablet 0  . aspirin EC 81 MG tablet Take 2 tablets (162 mg total) by mouth daily.    Marland Kitchen labetalol (NORMODYNE) 300 MG tablet TAKE 1 TABLET (300 MG TOTAL) BY MOUTH 2 (TWO) TIMES DAILY. 60 tablet 3  . olmesartan (BENICAR) 40 MG tablet Take 1 tablet (40 mg total) by mouth daily. 30 tablet 11  . ranolazine (RANEXA) 500 MG 12 hr tablet Take 1 tablet (500 mg total) by mouth 2 (two) times daily.    . simvastatin (ZOCOR) 40 MG tablet TAKE 1 TABLET BY MOUTH EVERY DAY AT BEDTIME 90 tablet 1  . varenicline (CHANTIX CONTINUING MONTH PAK) 1 MG tablet Take 1 tablet (1 mg total) by mouth 2 (two) times daily. 60 tablet 3  . varenicline (CHANTIX STARTING MONTH PAK) 0.5 MG X 11 & 1 MG X 42 tablet Take one 0.5 mg tablet by mouth once daily for 3 days,  then increase to one 0.5 mg tablet twice daily for 4 days, then increase to one 1 mg tablet twice daily. 53 tablet 0  . RANEXA 500 MG 12 hr tablet TAKE 1 TABLET BY MOUTH 2 TIMES DAILY. (Patient not taking: Reported on 07/18/2015) 60 tablet 0  . Hydrocod Polst-Chlorphen Polst 10-8 MG CP12 Take 1 tablet by mouth 2 (two) times daily as needed. (Patient not taking: Reported on 10/06/2014) 25 each 0   No facility-administered medications prior to visit.     Allergies:   Lisinopril   Social History   Social History  . Marital Status: Married    Spouse Name: N/A  . Number of Children: N/A  . Years of Education: N/A   Social History Main Topics  . Smoking status: Current Some Day  Smoker -- 0.00 packs/day    Types: Cigarettes  . Smokeless tobacco: Never Used  . Alcohol Use: No  . Drug Use: No  . Sexual Activity: Not Asked   Other Topics Concern  . None   Social History Narrative     Family History:  The patient's family history includes Colon cancer in his paternal uncle; Colon cancer (age of onset: 15) in his father; Heart failure in his mother; Hypertension in his father and mother.   ROS:   Please see the history of present illness.    Review of Systems  Constitution: Positive for malaise/fatigue.  HENT: Positive for headaches.   Cardiovascular: Positive for chest pain.  Musculoskeletal: Positive for back pain, joint pain and myalgias.  All other systems reviewed and are negative.   Physical Exam:    VS:  BP 150/94 mmHg  Pulse 62  Ht 5\' 8"  (1.727 m)   GEN: Well nourished, well developed, in no acute distress HEENT: normal Neck: no JVD, no masses Cardiac: Normal S1/S2, RRR; no murmurs,   no edema   Respiratory:  clear to auscultation bilaterally; no wheezing, rhonchi or rales GI: soft, nontender MS: no deformity or atrophy Skin: warm and dry  Neuro:  no focal deficits  Psych: Alert and oriented x 3, normal affect  Wt Readings from Last 3 Encounters:  08/27/14 209 lb 12 oz (95.142 kg)  07/02/14 210 lb (95.255 kg)  06/18/14 203 lb (92.08 kg)      Studies/Labs Reviewed:     EKG:  EKG is  ordered today.  The ekg ordered today demonstrates NSR, HR 62, normal axis, LVH with repolarization abnormality (T-wave inversions in 2, 3, aVF, V3-V6), no change since prior tracing, QTc 434 ms  Recent Labs: 08/27/2014: TSH 0.47 10/05/2014: ALT 13*; BUN 13; Creatinine, Ser 1.60*; Hemoglobin 11.9*; Platelets 249; Potassium 3.9; Sodium 140   Recent Lipid Panel    Component Value Date/Time   CHOL 161 08/27/2014 0847   TRIG 77.0 08/27/2014 0847   HDL 35.60* 08/27/2014 0847   CHOLHDL 5 08/27/2014 0847   VLDL 15.4 08/27/2014 0847   LDLCALC 110* 08/27/2014  0847    Additional studies/ records that were reviewed today include:   LHC 6/15 LM normal LAD patent, D1 occluded, apical LAD occluded LCx mid branch 40% RI mid 40-60% RCA origin of PDA 40-50% FFR: PDA 0.91; RI 0.93 EF is 60%. 1. Widely patent RCA, circumflex, and LAD proximal vessels. The left main is also widely patent/normal. 2. Total occlusion of the small first diagonal with left to left collaterals. The apical LAD is totally occluded with homocollaterals. 3. Intermediate mid to ramus intermedius stenosis with FFR of 0.93. 4.  Intermediate PDA stenosis with FFR of 0.91 5. 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  Myoview 5/15 Intermediate risk stress nuclear study with a medium size, moderate severity reversible defect in the PDA territory (SDS 5). LV Ejection Fraction: 62%.  Echo 2/14 Mild LVH, vigorous LVF, EF 65-70%, apical AK consistent with apical hypertrophy variant, MAC   ASSESSMENT:     1. 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 Apical variant hypertrophic cardiomyopathy (HCC)   3. HLD (hyperlipidemia)   4. Essential hypertension   5. Chronic renal insufficiency, stage II (mild)     PLAN:     In order of problems listed above:  1. CAD - He presents for routine FU.  He has chronic stable angina.  His symptoms are somewhat limiting for him.  He denies any change over the past couple of years.  He remains on Amlodipine, Labetalol, Ranexa, Simvastatin, ASA.  I d/w Dr. Verdis Prime today.  We will continue with medical therapy for now.  2. Apical variant HCM - Continue beta-blocker.  It has been 3 years since his last echo. Arrange follow-up echocardiogram.  3. HL - LDL in 3/16 was 110.  Continue Simvastatin.  Obtain Lipids.  4. HTN - Uncontrolled.  Benicar is expensive for him. Heart rate will not allow further titration of labetalol. He is  on max dose amlodipine. As noted, reviewed with Dr. Katrinka Blazing. He does not have an LVOT gradient.  Therefore, we do not need to use caution with vasodilators.  I will add hydralazine 25 mg 3 times a day to help with blood pressure. I considered adding nitrates but he has taken Viagra in the past and has had significant headaches with sublingual nitroglycerin as well.  5. CKD - Obtain follow-up BMET today.    Medication Adjustments/Labs and Tests Ordered: Current medicines are reviewed at length with the patient today.  Concerns regarding medicines are outlined above.  Medication changes, Labs and Tests ordered today are outlined in the Patient Instructions noted below. Patient Instructions  Medication Instructions:  Your physician has recommended you make the following change in your medication:  1.  HOLD the Ranexa for 2 weeks.  If you feel ok without it, then you can discontinue it.  If your symptoms worsen, then please call our office [phone]. 2.  START the Hydralazine 25 mg taking 1 tablet three times a day (every 6 hours while you are awake)  Labwork: TODAY:    LIPID AND BMET  Testing/Procedures: Your physician has requested that you have an echocardiogram. Echocardiography is a painless test that uses sound waves to create images of your heart. It provides your doctor with information about the size and shape of your heart and how well your heart's chambers and valves are working. This procedure takes approximately one hour. There are no restrictions for this procedure.  Follow-Up: Your physician recommends that you schedule a follow-up appointment in: 1 MONTH WITH Ceanna Wareing, PA-C (ON A DAY DR. Katrinka Blazing IS IN THE OFFICE)  Any Other Special Instructions Will Be Listed Below (If Applicable).  If you need a refill on your cardiac medications before your next appointment, please call your pharmacy.    Signed, Tereso Newcomer, PA-C  07/18/2015 2:07 PM    [facility]  [facility] [address] [place], [place], [place]  [address] Phone: [phone]; Fax: [phone]

## 2015-07-18 ENCOUNTER — Encounter: Payer: Self-pay | Admitting: Physician Assistant

## 2015-07-18 ENCOUNTER — Ambulatory Visit (INDEPENDENT_AMBULATORY_CARE_PROVIDER_SITE_OTHER): Payer: BLUE CROSS/BLUE SHIELD | Admitting: Physician Assistant

## 2015-07-18 VITALS — BP 150/94 | HR 62 | Ht 68.0 in

## 2015-07-18 DIAGNOSIS — I10 Essential (primary) hypertension: Secondary | ICD-10-CM | POA: Diagnosis not present

## 2015-07-18 DIAGNOSIS — I25119 Atherosclerotic heart disease of native coronary artery with unspecified angina pectoris: Secondary | ICD-10-CM

## 2015-07-18 DIAGNOSIS — E785 Hyperlipidemia, unspecified: Secondary | ICD-10-CM

## 2015-07-18 DIAGNOSIS — N182 Chronic kidney disease, stage 2 (mild): Secondary | ICD-10-CM

## 2015-07-18 DIAGNOSIS — N189 Chronic kidney disease, unspecified: Secondary | ICD-10-CM

## 2015-07-18 DIAGNOSIS — I422 Other hypertrophic cardiomyopathy: Secondary | ICD-10-CM

## 2015-07-18 DIAGNOSIS — I2510 Atherosclerotic heart disease of native coronary artery without angina pectoris: Secondary | ICD-10-CM | POA: Diagnosis not present

## 2015-07-18 LAB — LIPID PANEL
CHOLESTEROL: 153 mg/dL (ref 125–200)
HDL: 44 mg/dL (ref >=40)
LDL Cholesterol: 100 mg/dL (ref <130)
Total CHOL/HDL Ratio: 3.5 Ratio (ref <=5.0)
Triglycerides: 43 mg/dL (ref <150)
VLDL: 9 mg/dL (ref <30)

## 2015-07-18 LAB — BASIC METABOLIC PANEL
BUN: 14 mg/dL (ref 7–25)
CALCIUM: 9.4 mg/dL (ref 8.6–10.3)
CO2: 25 mmol/L (ref 20–31)
Chloride: 104 mmol/L (ref 98–110)
Creat: 1.34 mg/dL — ABNORMAL HIGH (ref 0.70–1.33)
GLUCOSE: 92 mg/dL (ref 65–99)
Potassium: 4.2 mmol/L (ref 3.5–5.3)
Sodium: 138 mmol/L (ref 135–146)

## 2015-07-18 MED ORDER — HYDRALAZINE HCL 25 MG PO TABS: 25.0000 mg | ORAL_TABLET | Freq: Three times a day (TID) | ORAL | Status: DC

## 2015-07-18 NOTE — Patient Instructions (Addendum)
Medication Instructions:  Your physician has recommended you make the following change in your medication:  1.  HOLD the Ranexa for 2 weeks.  If you feel ok without it, then you can discontinue it.  If your symptoms worsen, then please call our office [phone]. 2.  START the Hydralazine 25 mg taking 1 tablet three times a day (every 6 hours while you are awake)  Labwork: TODAY:    LIPID AND BMET  Testing/Procedures: Your physician has requested that you have an echocardiogram. Echocardiography is a painless test that uses sound waves to create images of your heart. It provides your doctor with information about the size and shape of your heart and how well your heart's chambers and valves are working. This procedure takes approximately one hour. There are no restrictions for this procedure.  Follow-Up: Your physician recommends that you schedule a follow-up appointment in: 1 MONTH WITH SCOTT WEAVER, PA-C (ON A DAY DR. Katrinka Blazing IS IN THE OFFICE)  Any Other Special Instructions Will Be Listed Below (If Applicable). Echocardiogram An echocardiogram, or echocardiography, uses sound waves (ultrasound) to produce an image of your heart. The echocardiogram is simple, painless, obtained within a short period of time, and offers valuable information to your health care provider. The images from an echocardiogram can provide information such as:  Evidence of coronary artery disease (CAD).  Heart size.  Heart muscle function.  Heart valve function.  Aneurysm detection.  Evidence of a past heart attack.  Fluid buildup around the heart.  Heart muscle thickening.  Assess heart valve function. LET [facility] CARE PROVIDER KNOW ABOUT:  Any allergies you have.  All medicines you are taking, including vitamins, herbs, eye drops, creams, and over-the-counter medicines.  Previous problems you or members of your family have had with the use of anesthetics.  Any blood disorders you  have.  Previous surgeries you have had.  Medical conditions you have.  Possibility of pregnancy, if this applies. BEFORE THE PROCEDURE  No special preparation is needed. Eat and drink normally.  PROCEDURE   In order to produce an image of your heart, gel will be applied to your chest and a wand-like tool (transducer) will be moved over your chest. The gel will help transmit the sound waves from the transducer. The sound waves will harmlessly bounce off your heart to allow the heart images to be captured in real-time motion. These images will then be recorded.  You may need an IV to receive a medicine that improves the quality of the pictures. AFTER THE PROCEDURE You may return to your normal schedule including diet, activities, and medicines, unless your health care provider tells you otherwise.   This information is not intended to replace advice given to you by your health care provider. Make sure you discuss any questions you have with your health care provider.   Document Released: 05/14/2000 Document Revised: 06/07/2014 Document Reviewed: 01/22/2013 Elsevier Interactive Patient Education [employer]  If you need a refill on your cardiac medications before your next appointment, please call your pharmacy.

## 2015-07-21 ENCOUNTER — Telehealth: Payer: Self-pay | Admitting: *Deleted

## 2015-07-21 DIAGNOSIS — E785 Hyperlipidemia, unspecified: Secondary | ICD-10-CM

## 2015-07-21 DIAGNOSIS — I25119 Atherosclerotic heart disease of native coronary artery with unspecified angina pectoris: Secondary | ICD-10-CM

## 2015-07-21 MED ORDER — ROSUVASTATIN CALCIUM 20 MG PO TABS: 20.0000 mg | ORAL_TABLET | Freq: Every day | ORAL | Status: DC

## 2015-07-21 NOTE — Telephone Encounter (Signed)
Pt has been notified of lab results and advised to d/c simvastatin and start crestor 20 mg daily; FLP/LFT 10/03/15. Pt agreeable to plan of care.

## 2015-07-24 ENCOUNTER — Other Ambulatory Visit: Payer: Self-pay | Admitting: *Deleted

## 2015-07-24 MED ORDER — OLMESARTAN MEDOXOMIL 40 MG PO TABS: 40.0000 mg | ORAL_TABLET | Freq: Every day | ORAL | Status: DC

## 2015-08-08 ENCOUNTER — Other Ambulatory Visit: Payer: Self-pay | Admitting: Interventional Cardiology

## 2015-08-11 ENCOUNTER — Other Ambulatory Visit: Payer: Self-pay | Admitting: Interventional Cardiology

## 2015-08-15 ENCOUNTER — Other Ambulatory Visit: Payer: Self-pay

## 2015-08-15 ENCOUNTER — Ambulatory Visit (HOSPITAL_COMMUNITY): Payer: BLUE CROSS/BLUE SHIELD | Attending: Internal Medicine

## 2015-08-15 DIAGNOSIS — Z8249 Family history of ischemic heart disease and other diseases of the circulatory system: Secondary | ICD-10-CM | POA: Insufficient documentation

## 2015-08-15 DIAGNOSIS — Z8673 Personal history of transient ischemic attack (TIA), and cerebral infarction without residual deficits: Secondary | ICD-10-CM | POA: Insufficient documentation

## 2015-08-15 DIAGNOSIS — Z720 Tobacco use: Secondary | ICD-10-CM | POA: Diagnosis not present

## 2015-08-15 DIAGNOSIS — I1310 Hypertensive heart and chronic kidney disease without heart failure, with stage 1 through stage 4 chronic kidney disease, or unspecified chronic kidney disease: Secondary | ICD-10-CM | POA: Diagnosis not present

## 2015-08-15 DIAGNOSIS — I2510 Atherosclerotic heart disease of native coronary artery without angina pectoris: Secondary | ICD-10-CM | POA: Insufficient documentation

## 2015-08-15 DIAGNOSIS — N189 Chronic kidney disease, unspecified: Secondary | ICD-10-CM | POA: Insufficient documentation

## 2015-08-15 DIAGNOSIS — E785 Hyperlipidemia, unspecified: Secondary | ICD-10-CM | POA: Diagnosis not present

## 2015-08-15 DIAGNOSIS — I422 Other hypertrophic cardiomyopathy: Secondary | ICD-10-CM | POA: Insufficient documentation

## 2015-08-16 ENCOUNTER — Emergency Department (HOSPITAL_COMMUNITY)
Admission: EM | Admit: 2015-08-16 | Discharge: 2015-08-16 | Disposition: A | Discharge status: Home / Self Care | Payer: BLUE CROSS/BLUE SHIELD | Attending: Emergency Medicine | Admitting: Emergency Medicine

## 2015-08-16 ENCOUNTER — Emergency Department (HOSPITAL_COMMUNITY): Payer: BLUE CROSS/BLUE SHIELD

## 2015-08-16 ENCOUNTER — Encounter (HOSPITAL_COMMUNITY): Payer: Self-pay

## 2015-08-16 DIAGNOSIS — F17210 Nicotine dependence, cigarettes, uncomplicated: Secondary | ICD-10-CM | POA: Diagnosis not present

## 2015-08-16 DIAGNOSIS — Z8673 Personal history of transient ischemic attack (TIA), and cerebral infarction without residual deficits: Secondary | ICD-10-CM | POA: Insufficient documentation

## 2015-08-16 DIAGNOSIS — G8929 Other chronic pain: Secondary | ICD-10-CM | POA: Diagnosis not present

## 2015-08-16 DIAGNOSIS — R519 Headache, unspecified: Secondary | ICD-10-CM

## 2015-08-16 DIAGNOSIS — R51 Headache: Secondary | ICD-10-CM | POA: Insufficient documentation

## 2015-08-16 DIAGNOSIS — Z7982 Long term (current) use of aspirin: Secondary | ICD-10-CM | POA: Diagnosis not present

## 2015-08-16 DIAGNOSIS — Z79899 Other long term (current) drug therapy: Secondary | ICD-10-CM | POA: Diagnosis not present

## 2015-08-16 DIAGNOSIS — I2510 Atherosclerotic heart disease of native coronary artery without angina pectoris: Secondary | ICD-10-CM | POA: Insufficient documentation

## 2015-08-16 DIAGNOSIS — E785 Hyperlipidemia, unspecified: Secondary | ICD-10-CM | POA: Diagnosis not present

## 2015-08-16 DIAGNOSIS — Z9889 Other specified postprocedural states: Secondary | ICD-10-CM | POA: Diagnosis not present

## 2015-08-16 DIAGNOSIS — H9191 Unspecified hearing loss, right ear: Secondary | ICD-10-CM | POA: Insufficient documentation

## 2015-08-16 DIAGNOSIS — I10 Essential (primary) hypertension: Secondary | ICD-10-CM | POA: Insufficient documentation

## 2015-08-16 MED ORDER — DIPHENHYDRAMINE HCL 50 MG/ML IJ SOLN
25.0000 mg | Freq: Once | INTRAMUSCULAR | Status: AC
  Administered 2015-08-16: 25 mg via INTRAVENOUS
  Filled 2015-08-16: qty 1

## 2015-08-16 MED ORDER — KETOROLAC TROMETHAMINE 30 MG/ML IJ SOLN
30.0000 mg | Freq: Once | INTRAMUSCULAR | Status: AC
  Administered 2015-08-16: 30 mg via INTRAVENOUS
  Filled 2015-08-16: qty 1

## 2015-08-16 MED ORDER — SODIUM CHLORIDE 0.9 % IV BOLUS (SEPSIS)
1000.0000 mL | Freq: Once | INTRAVENOUS | Status: AC
  Administered 2015-08-16: 1000 mL via INTRAVENOUS

## 2015-08-16 MED ORDER — IBUPROFEN 800 MG PO TABS: 800.0000 mg | ORAL_TABLET | Freq: Three times a day (TID) | ORAL | Status: DC | PRN

## 2015-08-16 MED ORDER — PROCHLORPERAZINE EDISYLATE 5 MG/ML IJ SOLN
10.0000 mg | Freq: Once | INTRAMUSCULAR | Status: AC
  Administered 2015-08-16: 10 mg via INTRAVENOUS
  Filled 2015-08-16: qty 2

## 2015-08-16 NOTE — ED Provider Notes (Signed)
CSN: [id number]     Arrival date & time 08/16/15  1405 History   First MD Initiated Contact with Patient 08/16/15 1651     Chief Complaint  Patient presents with  . Headache     (Consider location/radiation/quality/duration/timing/severity/associated sxs/prior Treatment) HPI Patient presents to the emergency department with headache that started this morning.  Patient states that it is on the left side of his head feels like it is behind his left eye.  He states he does have light sensitivity.  He states that he has had headaches in the past similar to this.  He states that he did not take any medications prior to arrival for his symptoms.  Patient denies vomiting, diarrhea, abdominal pain, chest pain, shortness of breath, back pain, neck pain, fever, dysuria, incontinence, weakness, numbness, lightheadedness, near syncope or syncope.  Patient states that he has had TIA in the past Past Medical History  Diagnosis Date  . CAD (coronary artery disease)     a. LHC in 2011 with severe dz in D1/D2 and very distal LAD--> too small for PCI and managed medically. b.  LHC in 10/2013 after an abnormal nuclear study w/ non-obst dz/  . Hypertension   . Hyperlipidemia   . HOH (hard of hearing)     rgiht ear  . Stroke (HCC)   . Migraine headache   . Chronic back pain   . Apical variant hypertrophic cardiomyopathy [facility])    Past Surgical History  Procedure Laterality Date  . Inguinal hernia repair    . Left heart catheterization with coronary angiogram N/A 11/22/2013    Procedure: LEFT HEART CATHETERIZATION WITH CORONARY ANGIOGRAM;  Surgeon: [name], MD;  Location: [facility] CATH LAB;  Service: Cardiovascular;  Laterality: N/A;   Family History  Problem Relation Age of Onset  . Hypertension Mother   . Heart failure Mother   . Hypertension Father   . Colon cancer Father 24  . Colon cancer Paternal Uncle    Social History  Substance Use Topics  . Smoking status: Current Some Day Smoker --  0.00 packs/day    Types: Cigarettes  . Smokeless tobacco: Never Used  . Alcohol Use: No    Review of Systems All other systems negative except as documented in the HPI. All pertinent positives and negatives as reviewed in the HPI.   Allergies  Lisinopril  Home Medications   Prior to Admission medications   Medication Sig Start Date End Date Taking? Authorizing Provider  amLODipine (NORVASC) 10 MG tablet TAKE 1 TABLET BY MOUTH EVERY DAY 08/08/15  Yes Lyn Records, MD  aspirin EC 81 MG tablet Take 2 tablets (162 mg total) by mouth daily. Patient taking differently: Take 81 mg by mouth daily.  07/02/14  Yes Luke K Kilroy, PA-C  hydrALAZINE (APRESOLINE) 25 MG tablet Take 1 tablet (25 mg total) by mouth 3 (three) times daily. 07/18/15  Yes Scott T Alben Spittle, PA-C  labetalol (NORMODYNE) 300 MG tablet TAKE 1 TABLET BY MOUTH TWICE A DAY 08/12/15  Yes Lyn Records, MD  olmesartan (BENICAR) 40 MG tablet Take 1 tablet (40 mg total) by mouth daily. 07/24/15  Yes Lyn Records, MD  rosuvastatin (CRESTOR) 20 MG tablet Take 1 tablet (20 mg total) by mouth daily. 07/21/15  Yes Scott T Alben Spittle, PA-C  simvastatin (ZOCOR) 40 MG tablet Take 40 mg by mouth daily.   Yes Historical Provider, MD  RANEXA 500 MG 12 hr tablet TAKE 1 TABLET BY MOUTH 2  TIMES DAILY. Patient not taking: Reported on 07/18/2015 07/03/15   Lyn Records, MD  ranolazine (RANEXA) 500 MG 12 hr tablet Take 1 tablet (500 mg total) by mouth 2 (two) times daily. 11/20/13   Lyn Records, MD  varenicline (CHANTIX CONTINUING MONTH PAK) 1 MG tablet Take 1 tablet (1 mg total) by mouth 2 (two) times daily. 08/27/14   Etta Grandchild, MD  varenicline (CHANTIX STARTING MONTH PAK) 0.5 MG X 11 & 1 MG X 42 tablet Take one 0.5 mg tablet by mouth once daily for 3 days, then increase to one 0.5 mg tablet twice daily for 4 days, then increase to one 1 mg tablet twice daily. 08/27/14   Etta Grandchild, MD   BP 161/98 mmHg  Pulse 50  Temp(Src) 98.6 F (37 C) (Oral)  Resp  16  Ht 5\' 8"  (1.727 m)  Wt 89.585 kg  BMI 30.04 kg/m2  SpO2 100% Physical Exam  Constitutional: He is oriented to person, place, and time. He appears well-developed and well-nourished. No distress.  HENT:  Head: Normocephalic and atraumatic.  Mouth/Throat: Oropharynx is clear and moist.  Eyes: Pupils are equal, round, and reactive to light.  Neck: Normal range of motion. Neck supple.  Cardiovascular: Normal rate, regular rhythm and normal heart sounds.  Exam reveals no gallop and no friction rub.   No murmur heard. Pulmonary/Chest: Effort normal and breath sounds normal. No respiratory distress. He has no wheezes.  Neurological: He is alert and oriented to person, place, and time. He exhibits normal muscle tone. Coordination normal.  Skin: Skin is warm and dry. No rash noted. No erythema.  Psychiatric: He has a normal mood and affect. His behavior is normal.  Nursing note and vitals reviewed.   ED Course  Procedures (including critical care time) Labs Review Labs Reviewed - No data to display  Imaging Review Ct Head Wo Contrast  08/16/2015  CLINICAL DATA:  Acute onset of left-sided headache. High blood pressure. Initial encounter. EXAM: CT HEAD WITHOUT CONTRAST TECHNIQUE: Contiguous axial images were obtained from the base of the skull through the vertex without intravenous contrast. COMPARISON:  CT of the head and MRI of the brain performed 03/26/2013 FINDINGS: There is no evidence of acute infarction, mass lesion, or intra- or extra-axial hemorrhage on CT. Scattered periventricular and subcortical white matter change likely reflects small vessel ischemic microangiopathy. Mild chronic ischemic change is noted at the basal ganglia bilaterally. The brainstem and fourth ventricle are within normal limits. The cerebral hemispheres demonstrate grossly normal gray-white differentiation. No mass effect or midline shift is seen. There is no evidence of fracture; visualized osseous structures are  unremarkable in appearance. The orbits are within normal limits. The paranasal sinuses and mastoid air cells are well-aerated. No significant soft tissue abnormalities are seen. IMPRESSION: 1. No acute intracranial pathology seen on CT. 2. Mild small vessel ischemic microangiopathy. 3. Mild chronic ischemic change at the basal ganglia bilaterally. Electronically Signed   By: Roanna Raider M.D.   On: 08/16/2015 18:43   I have personally reviewed and evaluated these images and lab results as part of my medical decision-making.   Patient's head CT was reviewed.  There is no significant abnormalities noted at this time.  He has no neurological deficits noted on exam.  He has normal gait and normal sensation.  The patient is advised to return here as needed   Charlestine Night, PA-C 08/16/15 2017  Vanetta Mulders, MD 08/17/15 [phone]

## 2015-08-16 NOTE — ED Notes (Addendum)
Pt reports onset this morning headache and pain behind left eye, blurred vision, nausea.  No other s/s noted. Has not tried any treatments for headache.

## 2015-08-16 NOTE — ED Notes (Signed)
Patient transported to X-ray 

## 2015-08-16 NOTE — ED Notes (Signed)
Pt left with all his belongings and ambulated out of the treatment area with family.

## 2015-08-16 NOTE — Discharge Instructions (Signed)
Return here as needed.  Follow-up with your primary care Dr. for recheck, increase her fluid intake and rest as much as possible

## 2015-08-17 ENCOUNTER — Encounter: Payer: Self-pay | Admitting: Physician Assistant

## 2015-08-18 ENCOUNTER — Telehealth: Payer: Self-pay | Admitting: Physician Assistant

## 2015-08-18 NOTE — Telephone Encounter (Signed)
New message      Calling to see if pt is to be on both simvastatin 40mg  and generic crestor 20mg .  Please call

## 2015-08-19 NOTE — Telephone Encounter (Signed)
Wife stated pt was confused and thought he was supposed to still be taking Simvastatin and the Crestor. I advised wife that I s/w pt on 2/17 advised to d/c simvistatin and start crestor 20 mg daily. Wife said he gets confused. Pt has appt 3/23 w/PA. I will route phone note to Tereso Newcomer, [place]. Wife said she will be coming to appt 08/21/15 with pt.

## 2015-08-19 NOTE — Telephone Encounter (Signed)
[address] [place], [place]   08/19/2015 5:28 PM

## 2015-08-21 ENCOUNTER — Ambulatory Visit (INDEPENDENT_AMBULATORY_CARE_PROVIDER_SITE_OTHER): Payer: BLUE CROSS/BLUE SHIELD | Admitting: Physician Assistant

## 2015-08-21 ENCOUNTER — Telehealth: Payer: Self-pay | Admitting: *Deleted

## 2015-08-21 ENCOUNTER — Encounter: Payer: Self-pay | Admitting: Physician Assistant

## 2015-08-21 ENCOUNTER — Encounter: Payer: Self-pay | Admitting: *Deleted

## 2015-08-21 VITALS — BP 140/90 | HR 62 | Ht 68.0 in | Wt 198.8 lb

## 2015-08-21 DIAGNOSIS — F17200 Nicotine dependence, unspecified, uncomplicated: Secondary | ICD-10-CM

## 2015-08-21 DIAGNOSIS — E785 Hyperlipidemia, unspecified: Secondary | ICD-10-CM

## 2015-08-21 DIAGNOSIS — R931 Abnormal findings on diagnostic imaging of heart and coronary circulation: Secondary | ICD-10-CM

## 2015-08-21 DIAGNOSIS — I25119 Atherosclerotic heart disease of native coronary artery with unspecified angina pectoris: Secondary | ICD-10-CM | POA: Diagnosis not present

## 2015-08-21 DIAGNOSIS — I119 Hypertensive heart disease without heart failure: Secondary | ICD-10-CM

## 2015-08-21 DIAGNOSIS — I422 Other hypertrophic cardiomyopathy: Secondary | ICD-10-CM

## 2015-08-21 DIAGNOSIS — N189 Chronic kidney disease, unspecified: Secondary | ICD-10-CM

## 2015-08-21 DIAGNOSIS — N182 Chronic kidney disease, stage 2 (mild): Secondary | ICD-10-CM

## 2015-08-21 LAB — BASIC METABOLIC PANEL
BUN: 12 mg/dL (ref 7–25)
CALCIUM: 9.4 mg/dL (ref 8.6–10.3)
CO2: 25 mmol/L (ref 20–31)
Chloride: 105 mmol/L (ref 98–110)
Creat: 1.27 mg/dL (ref 0.70–1.33)
Glucose, Bld: 82 mg/dL (ref 65–99)
Potassium: 4.3 mmol/L (ref 3.5–5.3)
SODIUM: 138 mmol/L (ref 135–146)

## 2015-08-21 MED ORDER — HYDRALAZINE HCL 50 MG PO TABS: 50.0000 mg | ORAL_TABLET | Freq: Three times a day (TID) | ORAL | Status: DC

## 2015-08-21 NOTE — Progress Notes (Signed)
Cardiology Office Note:    Date:  08/21/2015   ID:  [name], DOB 1960/11/13, MRN [id number]  PCP:  [name] Linger, MD  Cardiologist:  Dr. Verdis Prime   Electrophysiologist:  n/a  Chief Complaint  Patient presents with  . Follow-up    HTN    History of Present Illness:     [name] is a 55 y.o. male with a hx of CAD, apical variant hypertrophic cardiomyopathy, HTN, HL, prior stroke, CKD stage II. Cardiac catheterization 2011 demonstrated severe disease in the D1 and D2 and very distal LAD. Vessels were too small for PCI manage medically. LHC in 2015 demonstrated D1 and apical LAD were occluded. Intermediate RI and intermediate PDA stenoses were noted. Lesions were insignificant by FFR. Medical therapy was continued. Patient was evaluated in the hospital 1/16 for chest pain. He ruled out for myocardial infarction.    I saw him in 2/17 for follow-up. He had stable angina. I reviewed his case with Dr. Katrinka Blazing. We agreed that continued medical management was appropriate. Upon review of his prior echo, he had no LVOT gradient. Blood pressure is uncontrolled. I added hydralazine for blood pressure control. Follow-up echocardiogram demonstrated no apical hypertrophy, hyperdynamic LV function and a small mobile echodensity on the outflow side of aortic valve attached to noncoronary cusp. I reviewed this with Dr. Katrinka Blazing. He agreed that transesophageal echocardiogram is appropriate. He returns for follow-up.  Here with his wife. Overall doing well. He continues to have exertional chest pain. This is no worse. At last visit, we stopped ranolazine. This has not made a difference in his anginal symptoms. He still has dyspnea with exertion. Denies orthopnea, PND or edema. Denies syncope. Blood pressures at home have remained high at times. He does admit to a diet high in salt.   Past Medical History  Diagnosis Date  . CAD (coronary artery disease)     a. LHC in 2011 with severe dz in D1/D2  and very distal LAD--> too small for PCI and managed medically. b.  LHC in 10/2013 after an abnormal nuclear study w/ non-obst dz/  . Hypertension   . Hyperlipidemia   . HOH (hard of hearing)     rgiht ear  . Stroke (HCC)   . Migraine headache   . Chronic back pain   . Apical variant hypertrophic cardiomyopathy (HCC)   . 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 Inguinal hernia repair    . Left heart catheterization with coronary angiogram N/A 11/22/2013    Procedure: LEFT HEART CATHETERIZATION WITH CORONARY ANGIOGRAM;  Surgeon: [name], MD;  Location[facility] CATH LAB;  Service: Cardiovascular;  Laterality: N/A;    Current Medications: Outpatient Prescriptions Prior to Visit  Medication Sig Dispense Refill  . amLODipine (NORVASC) 10 MG tablet TAKE 1 TABLET BY MOUTH EVERY DAY 30 tablet 11  . aspirin EC 81 MG tablet Take 2 tablets (162 mg total) by mouth daily. (Patient taking differently: Take 81 mg by mouth daily. )    . ibuprofen (ADVIL,MOTRIN) 800 MG tablet Take 1 tablet (800 mg total) by mouth every 8 (eight) hours as needed. 21 tablet 0  . labetalol (NORMODYNE) 300 MG tablet TAKE 1 TABLET BY MOUTH TWICE A  DAY 60 tablet 10  . olmesartan (BENICAR) 40 MG tablet Take 1 tablet (40 mg total) by mouth daily. 30 tablet 11  . rosuvastatin (CRESTOR) 20 MG tablet Take 1 tablet (20 mg total) by mouth daily. 30 tablet 11  . hydrALAZINE (APRESOLINE) 25 MG tablet Take 1 tablet (25 mg total) by mouth 3 (three) times daily. 270 tablet 3  . RANEXA 500 MG 12 hr tablet TAKE 1 TABLET BY MOUTH 2 TIMES DAILY. (Patient not taking: Reported on 07/18/2015) 60 tablet 0  . ranolazine (RANEXA) 500 MG 12 hr tablet Take 1 tablet (500 mg total) by mouth 2 (two) times daily.  (Patient not taking: Reported on 08/21/2015)    . simvastatin (ZOCOR) 40 MG tablet Take 40 mg by mouth daily. Reported on 08/21/2015    . varenicline (CHANTIX CONTINUING MONTH PAK) 1 MG tablet Take 1 tablet (1 mg total) by mouth 2 (two) times daily. (Patient not taking: Reported on 08/21/2015) 60 tablet 3  . varenicline (CHANTIX STARTING MONTH PAK) 0.5 MG X 11 & 1 MG X 42 tablet Take one 0.5 mg tablet by mouth once daily for 3 days, then increase to one 0.5 mg tablet twice daily for 4 days, then increase to one 1 mg tablet twice daily. (Patient not taking: Reported on 08/21/2015) 53 tablet 0   No facility-administered medications prior to visit.     Allergies:   Lisinopril   Social History   Social History  . Marital Status: Married    Spouse Name: N/A  . Number of Children: N/A  . Years of Education: N/A   Social History Main Topics  . Smoking status: Current Some Day Smoker -- 0.00 packs/day    Types: Cigarettes  . Smokeless tobacco: Never Used  . Alcohol Use: No  . Drug Use: No  . Sexual Activity: Not Asked   Other Topics Concern  . None   Social History Narrative     Family History:  The patient's family history includes Colon cancer in his paternal uncle; Colon cancer (age of onset: 10) in his father; Heart failure in his mother; Hypertension in his father and mother.   ROS:   Please see the history of present illness.    Review of Systems  HENT: Positive for headaches.   Cardiovascular: Positive for dyspnea on exertion.  Musculoskeletal: Positive for back pain, joint pain and myalgias.  All other systems reviewed and are negative.   Physical Exam:    VS:  BP 140/90 mmHg  Pulse 62  Ht 5\' 8"  (1.727 m)  Wt 198 lb 12.8 oz (90.175 kg)  BMI 30.23 kg/m2   GEN: Well nourished, well developed, in no acute distress HEENT: normal Neck: no JVD, no masses Cardiac: Normal S1/S2, RRR; no murmurs,   no edema   Respiratory:  clear to auscultation bilaterally; no wheezing,  rhonchi or rales GI: soft, nontender MS: no deformity or atrophy Skin: warm and dry  Neuro:  no focal deficits  Psych: Alert and oriented x 3, normal affect  Wt Readings from Last 3 Encounters:  08/21/15 198 lb 12.8 oz (90.175 kg)  08/16/15 197 lb 8 oz (89.585 kg)  08/27/14 209 lb 12 oz (95.142 kg)      Studies/Labs Reviewed:     EKG:  EKG is  ordered today.  The ekg ordered today demonstrates NSR, HR 62, normal axis, T-wave inversions in 2, 3, aVF, V4-V6, LVH with repolarization abnormalities, no change from prior tracings  Recent Labs: 08/27/2014:  TSH 0.47 10/05/2014: ALT 13*; Hemoglobin 11.9*; Platelets 249 08/21/2015: BUN 12; Creat 1.27; Potassium 4.3; Sodium 138   Recent Lipid Panel    Component Value Date/Time   CHOL 153 07/18/2015 1030   TRIG 43 07/18/2015 1030   HDL 44 07/18/2015 1030   CHOLHDL 3.5 07/18/2015 1030   VLDL 9 07/18/2015 1030   LDLCALC 100 07/18/2015 1030    Additional studies/ records that were reviewed today include:   Echo 3/17 Mild LVH, no significant LV thickening at the apex, EF 75%, normal wall motion, grade 1 diastolic dysfunction, small mobile echodensity on outflow side of aortic valve attached to non-coronary cusp (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 No acute intracranial pathology seen on CT. 2. Mild small vessel ischemic microangiopathy. 3. Mild chronic ischemic change at the basal ganglia bilaterally.  LHC 6/15 LM normal LAD patent, D1 occluded, apical LAD occluded LCx mid branch 40% RI mid 40-60% RCA origin of PDA 40-50% FFR: PDA 0.91; RI 0.93 EF is 60%. 1. Widely patent RCA, circumflex, and LAD proximal vessels. The left main is also widely patent/normal. 2. Total occlusion of the small first diagonal with left to left collaterals. The apical LAD is totally occluded with homocollaterals. 3. Intermediate mid to ramus intermedius stenosis with FFR of 0.93. 4. Intermediate PDA  stenosis with FFR of 0.91 5. 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  Myoview 5/15 Intermediate risk stress nuclear study with a medium size, moderate severity reversible defect in the PDA territory (SDS 5). LV Ejection Fraction: 62%.  Echo 2/14 Mild LVH, vigorous LVF, EF 65-70%, apical AK consistent with apical hypertrophy variant, MAC   ASSESSMENT:     1. 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 Apical variant hypertrophic cardiomyopathy (HCC)   3. Abnormal echocardiogram   4. HLD (hyperlipidemia)   5. Hypertensive heart disease without heart failure   6. Chronic renal insufficiency, stage II (mild)     PLAN:     In order of problems listed above:  1. CAD -   He has chronic stable angina.  His symptoms are somewhat limiting for him.  He remains on Amlodipine, Labetalol, ASA.  We will continue with medical therapy for now.  2. Apical variant HCM - Continue beta-blocker.  Echo recently does not demonstrate apical thickening.   3. Abnormal Echo - His echo shows a density attached to the non-coronary cusp of the AV.  As noted, I reviewed with Dr. Verdis Prime.  Will arrange TEE.  Risks and benefits discussed with patient.     4. HL - LDL in 3/17 was 100. He was recently changed to Crestor.  Lipids and LFTs to be repeated in 5/17.    5. HTN - BP still elevated. Increase Hydralazine to 50 mg TID.   6. CKD - Recent Creatinine stable.    Medication Adjustments/Labs and Tests Ordered: Current medicines are reviewed at length with the patient today.  Concerns regarding medicines are outlined above.  Medication changes, Labs and Tests ordered today are outlined in the Patient Instructions noted below. Patient Instructions  Medication Instructions:  1. INCREASE HYDRALAZINE 50 MG THREE TIMES DAILY; NEW RX SENT  Labwork: TODAY BMET  Testing/Procedures: Your  physician has requested that you have a TEE. During a TEE, sound waves are used to create images of your heart. It provides your doctor with information about the size  and shape of your heart and how well your heart's chambers and valves are working. In this test, a transducer is attached to the end of a flexible tube that's guided down your throat and into your esophagus (the tube leading from you mouth to your stomach) to get a more detailed image of your heart. You are not awake for the procedure. Please see the instruction sheet given to you today. For further information please visit [email or web address]  Follow-Up: DR. Katrinka Blazing 3 MONTHS   Any Other Special Instructions Will Be Listed Below (If Applicable). If you need a refill on your cardiac medications before your next appointment, please call your pharmacy.    Signed, Tereso Newcomer, PA-C  08/21/2015 4:57 PM    [facility] [address] [place], [place], [place]  [address] Phone: [phone]; Fax: [phone]

## 2015-08-21 NOTE — Telephone Encounter (Signed)
Wife notified of lab results since pt gets confused at times. I did advise at next f/u will need to fill out DPR. Wife verbalized understanding

## 2015-08-21 NOTE — Patient Instructions (Addendum)
Medication Instructions:  1. INCREASE HYDRALAZINE 50 MG THREE TIMES DAILY; NEW RX SENT  Labwork: TODAY BMET  Testing/Procedures: Your physician has requested that you have a TEE. During a TEE, sound waves are used to create images of your heart. It provides your doctor with information about the size and shape of your heart and how well your heart's chambers and valves are working. In this test, a transducer is attached to the end of a flexible tube that's guided down your throat and into your esophagus (the tube leading from you mouth to your stomach) to get a more detailed image of your heart. You are not awake for the procedure. Please see the instruction sheet given to you today. For further information please visit [email or web address]  Follow-Up: DR. Katrinka Blazing 3 MONTHS   Any Other Special Instructions Will Be Listed Below (If Applicable). If you need a refill on your cardiac medications before your next appointment, please call your pharmacy.

## 2015-08-22 ENCOUNTER — Ambulatory Visit (HOSPITAL_COMMUNITY)
Admission: RE | Admit: 2015-08-22 | Discharge: 2015-08-22 | Disposition: A | Discharge status: Home / Self Care | Payer: BLUE CROSS/BLUE SHIELD | Source: Ambulatory Visit | Attending: Cardiovascular Disease | Admitting: Cardiovascular Disease

## 2015-08-22 ENCOUNTER — Encounter (HOSPITAL_COMMUNITY): Payer: Self-pay

## 2015-08-22 ENCOUNTER — Encounter (HOSPITAL_COMMUNITY)
Admission: RE | Disposition: A | Discharge status: Home / Self Care | Payer: Self-pay | Source: Ambulatory Visit | Attending: Cardiovascular Disease

## 2015-08-22 ENCOUNTER — Ambulatory Visit (HOSPITAL_BASED_OUTPATIENT_CLINIC_OR_DEPARTMENT_OTHER)
Admission: RE | Admit: 2015-08-22 | Discharge: 2015-08-22 | Disposition: A | Discharge status: Home / Self Care | Payer: BLUE CROSS/BLUE SHIELD | Source: Ambulatory Visit | Attending: Physician Assistant | Admitting: Physician Assistant

## 2015-08-22 DIAGNOSIS — I422 Other hypertrophic cardiomyopathy: Secondary | ICD-10-CM | POA: Insufficient documentation

## 2015-08-22 DIAGNOSIS — R931 Abnormal findings on diagnostic imaging of heart and coronary circulation: Secondary | ICD-10-CM | POA: Diagnosis not present

## 2015-08-22 DIAGNOSIS — I25119 Atherosclerotic heart disease of native coronary artery with unspecified angina pectoris: Secondary | ICD-10-CM | POA: Diagnosis not present

## 2015-08-22 DIAGNOSIS — E785 Hyperlipidemia, unspecified: Secondary | ICD-10-CM | POA: Insufficient documentation

## 2015-08-22 DIAGNOSIS — F17210 Nicotine dependence, cigarettes, uncomplicated: Secondary | ICD-10-CM | POA: Diagnosis not present

## 2015-08-22 DIAGNOSIS — N182 Chronic kidney disease, stage 2 (mild): Secondary | ICD-10-CM | POA: Diagnosis not present

## 2015-08-22 DIAGNOSIS — I1310 Hypertensive heart and chronic kidney disease without heart failure, with stage 1 through stage 4 chronic kidney disease, or unspecified chronic kidney disease: Secondary | ICD-10-CM | POA: Diagnosis not present

## 2015-08-22 DIAGNOSIS — I2510 Atherosclerotic heart disease of native coronary artery without angina pectoris: Secondary | ICD-10-CM | POA: Diagnosis not present

## 2015-08-22 DIAGNOSIS — Z7982 Long term (current) use of aspirin: Secondary | ICD-10-CM | POA: Diagnosis not present

## 2015-08-22 DIAGNOSIS — I10 Essential (primary) hypertension: Secondary | ICD-10-CM | POA: Diagnosis present

## 2015-08-22 DIAGNOSIS — I119 Hypertensive heart disease without heart failure: Secondary | ICD-10-CM

## 2015-08-22 DIAGNOSIS — Z8673 Personal history of transient ischemic attack (TIA), and cerebral infarction without residual deficits: Secondary | ICD-10-CM | POA: Diagnosis not present

## 2015-08-22 HISTORY — PX: TEE WITHOUT CARDIOVERSION: SHX5443

## 2015-08-22 SURGERY — ECHOCARDIOGRAM, TRANSESOPHAGEAL
Anesthesia: Moderate Sedation

## 2015-08-22 MED ORDER — FENTANYL CITRATE (PF) 100 MCG/2ML IJ SOLN
INTRAMUSCULAR | Status: DC | PRN
  Administered 2015-08-22 (×3): 25 ug via INTRAVENOUS

## 2015-08-22 MED ORDER — SODIUM CHLORIDE 0.9 % IV SOLN
INTRAVENOUS | Status: DC
  Administered 2015-08-22: 500 mL via INTRAVENOUS

## 2015-08-22 MED ORDER — BUTAMBEN-TETRACAINE-BENZOCAINE 2-2-14 % EX AERO
INHALATION_SPRAY | CUTANEOUS | Status: DC | PRN
  Administered 2015-08-22: 2 via TOPICAL

## 2015-08-22 MED ORDER — MIDAZOLAM HCL 5 MG/ML IJ SOLN
INTRAMUSCULAR | Status: AC
  Filled 2015-08-22: qty 2

## 2015-08-22 MED ORDER — FENTANYL CITRATE (PF) 100 MCG/2ML IJ SOLN
INTRAMUSCULAR | Status: AC
  Filled 2015-08-22: qty 2

## 2015-08-22 MED ORDER — DIPHENHYDRAMINE HCL 50 MG/ML IJ SOLN
INTRAMUSCULAR | Status: AC
  Filled 2015-08-22: qty 1

## 2015-08-22 MED ORDER — MIDAZOLAM HCL 10 MG/2ML IJ SOLN
INTRAMUSCULAR | Status: DC | PRN
  Administered 2015-08-22: 2 mg via INTRAVENOUS
  Administered 2015-08-22: 1 mg via INTRAVENOUS
  Administered 2015-08-22: 2 mg via INTRAVENOUS

## 2015-08-22 NOTE — Interval H&P Note (Signed)
History and Physical Interval Note:  08/22/2015 1:48 PM  [name]  has presented today for surgery, with the diagnosis of ABNORMAL ECHO  The various methods of treatment have been discussed with the patient and family. After consideration of risks, benefits and other options for treatment, the patient has consented to  Procedure(s): TRANSESOPHAGEAL ECHOCARDIOGRAM (TEE) (N/A) as a surgical intervention .  The patient's history has been reviewed, patient examined, no change in status, stable for surgery.  I have reviewed the patient's chart and labs.  Questions were answered to the patient's satisfaction.     Nahser, Deloris Ping

## 2015-08-22 NOTE — Discharge Instructions (Signed)

## 2015-08-22 NOTE — H&P (View-Only) (Signed)
Cardiology Office Note:    Date:  08/21/2015   ID:  [name], DOB 1960/11/13, MRN [id number]  PCP:  [name] Linger, MD  Cardiologist:  Dr. Verdis English   Electrophysiologist:  n/a  Chief Complaint  Patient presents with  . Follow-up    HTN    History of Present Illness:     [name] is a 55 y.o. male with a hx of CAD, apical variant hypertrophic cardiomyopathy, HTN, HL, prior stroke, CKD stage II. Cardiac catheterization 2011 demonstrated severe disease in the D1 and D2 and very distal LAD. Vessels were too small for PCI manage medically. LHC in 2015 demonstrated D1 and apical LAD were occluded. Intermediate RI and intermediate PDA stenoses were noted. Lesions were insignificant by FFR. Medical therapy was continued. Patient was evaluated in the hospital 1/16 for chest pain. He ruled out for myocardial infarction.    I saw him in 2/17 for follow-up. He had stable angina. I reviewed his case with Dr. Katrinka Blazing. We agreed that continued medical management was appropriate. Upon review of his prior echo, he had no LVOT gradient. Blood pressure is uncontrolled. I added hydralazine for blood pressure control. Follow-up echocardiogram demonstrated no apical hypertrophy, hyperdynamic LV function and a small mobile echodensity on the outflow side of aortic valve attached to noncoronary cusp. I reviewed this with Dr. Katrinka Blazing. He agreed that transesophageal echocardiogram is appropriate. He returns for follow-up.  Here with his wife. Overall doing well. He continues to have exertional chest pain. This is no worse. At last visit, we stopped ranolazine. This has not made a difference in his anginal symptoms. He still has dyspnea with exertion. Denies orthopnea, PND or edema. Denies syncope. Blood pressures at home have remained high at times. He does admit to a diet high in salt.   Past Medical History  Diagnosis Date  . CAD (coronary artery disease)     a. LHC in 2011 with severe dz in D1/D2  and very distal LAD--> too small for PCI and managed medically. b.  LHC in 10/2013 after an abnormal nuclear study w/ non-obst dz/  . Hypertension   . Hyperlipidemia   . HOH (hard of hearing)     rgiht ear  . Stroke (HCC)   . Migraine headache   . Chronic back pain   . Apical variant hypertrophic cardiomyopathy (HCC)   . 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 Inguinal hernia repair    . Left heart catheterization with coronary angiogram N/A 11/22/2013    Procedure: LEFT HEART CATHETERIZATION WITH CORONARY ANGIOGRAM;  Surgeon: [name], MD;  Location[facility] C[facility] LAB;  Service: Cardiovascular;  Laterality: N/A;    Current Medications: Outpatient Prescriptions Prior to Visit  Medication Sig Dispense Refill  . amLODipine (NORVASC) 10 MG tablet TAKE 1 TABLET BY MOUTH EVERY DAY 30 tablet 11  . aspirin EC 81 MG tablet Take 2 tablets (162 mg total) by mouth daily. (Patient taking differently: Take 81 mg by mouth daily. )    . ibuprofen (ADVIL,MOTRIN) 800 MG tablet Take 1 tablet (800 mg total) by mouth every 8 (eight) hours as needed. 21 tablet 0  . labetalol (NORMODYNE) 300 MG tablet TAKE 1 TABLET BY MOUTH TWICE A  DAY 60 tablet 10  . olmesartan (BENICAR) 40 MG tablet Take 1 tablet (40 mg total) by mouth daily. 30 tablet 11  . rosuvastatin (CRESTOR) 20 MG tablet Take 1 tablet (20 mg total) by mouth daily. 30 tablet 11  . hydrALAZINE (APRESOLINE) 25 MG tablet Take 1 tablet (25 mg total) by mouth 3 (three) times daily. 270 tablet 3  . RANEXA 500 MG 12 hr tablet TAKE 1 TABLET BY MOUTH 2 TIMES DAILY. (Patient not taking: Reported on 07/18/2015) 60 tablet 0  . ranolazine (RANEXA) 500 MG 12 hr tablet Take 1 tablet (500 mg total) by mouth 2 (two) times daily.  (Patient not taking: Reported on 08/21/2015)    . simvastatin (ZOCOR) 40 MG tablet Take 40 mg by mouth daily. Reported on 08/21/2015    . varenicline (CHANTIX CONTINUING MONTH PAK) 1 MG tablet Take 1 tablet (1 mg total) by mouth 2 (two) times daily. (Patient not taking: Reported on 08/21/2015) 60 tablet 3  . varenicline (CHANTIX STARTING MONTH PAK) 0.5 MG X 11 & 1 MG X 42 tablet Take one 0.5 mg tablet by mouth once daily for 3 days, then increase to one 0.5 mg tablet twice daily for 4 days, then increase to one 1 mg tablet twice daily. (Patient not taking: Reported on 08/21/2015) 53 tablet 0   No facility-administered medications prior to visit.     Allergies:   Lisinopril   Social History   Social History  . Marital Status: Married    Spouse Name: N/A  . Number of Children: N/A  . Years of Education: N/A   Social History Main Topics  . Smoking status: Current Some Day Smoker -- 0.00 packs/day    Types: Cigarettes  . Smokeless tobacco: Never Used  . Alcohol Use: No  . Drug Use: No  . Sexual Activity: Not Asked   Other Topics Concern  . None   Social History Narrative     Family History:  The patient's family history includes Colon cancer in his paternal uncle; Colon cancer (age of onset: 10) in his father; Heart failure in his mother; Hypertension in his father and mother.   ROS:   Please see the history of present illness.    Review of Systems  HENT: Positive for headaches.   Cardiovascular: Positive for dyspnea on exertion.  Musculoskeletal: Positive for back pain, joint pain and myalgias.  All other systems reviewed and are negative.   Physical Exam:    VS:  BP 140/90 mmHg  Pulse 62  Ht 5\' 8"  (1.727 m)  Wt 198 lb 12.8 oz (90.175 kg)  BMI 30.23 kg/m2   GEN: Well nourished, well developed, in no acute distress HEENT: normal Neck: no JVD, no masses Cardiac: Normal S1/S2, RRR; no murmurs,   no edema   Respiratory:  clear to auscultation bilaterally; no wheezing,  rhonchi or rales GI: soft, nontender MS: no deformity or atrophy Skin: warm and dry  Neuro:  no focal deficits  Psych: Alert and oriented x 3, normal affect  Wt Readings from Last 3 Encounters:  08/21/15 198 lb 12.8 oz (90.175 kg)  08/16/15 197 lb 8 oz (89.585 kg)  08/27/14 209 lb 12 oz (95.142 kg)      Studies/Labs Reviewed:     EKG:  EKG is  ordered today.  The ekg ordered today demonstrates NSR, HR 62, normal axis, T-wave inversions in 2, 3, aVF, V4-V6, LVH with repolarization abnormalities, no change from prior tracings  Recent Labs: 08/27/2014:  TSH 0.47 10/05/2014: ALT 13*; Hemoglobin 11.9*; Platelets 249 08/21/2015: BUN 12; Creat 1.27; Potassium 4.3; Sodium 138   Recent Lipid Panel    Component Value Date/Time   CHOL 153 07/18/2015 1030   TRIG 43 07/18/2015 1030   HDL 44 07/18/2015 1030   CHOLHDL 3.5 07/18/2015 1030   VLDL 9 07/18/2015 1030   LDLCALC 100 07/18/2015 1030    Additional studies/ records that were reviewed today include:   Echo 3/17 Mild LVH, no significant LV thickening at the apex, EF 75%, normal wall motion, grade 1 diastolic dysfunction, small mobile echodensity on outflow side of aortic valve attached to non-coronary cusp (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 No acute intracranial pathology seen on CT. 2. Mild small vessel ischemic microangiopathy. 3. Mild chronic ischemic change at the basal ganglia bilaterally.  LHC 6/15 LM normal LAD patent, D1 occluded, apical LAD occluded LCx mid branch 40% RI mid 40-60% RCA origin of PDA 40-50% FFR: PDA 0.91; RI 0.93 EF is 60%. 1. Widely patent RCA, circumflex, and LAD proximal vessels. The left main is also widely patent/normal. 2. Total occlusion of the small first diagonal with left to left collaterals. The apical LAD is totally occluded with homocollaterals. 3. Intermediate mid to ramus intermedius stenosis with FFR of 0.93. 4. Intermediate PDA  stenosis with FFR of 0.91 5. 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  Myoview 5/15 Intermediate risk stress nuclear study with a medium size, moderate severity reversible defect in the PDA territory (SDS 5). LV Ejection Fraction: 62%.  Echo 2/14 Mild LVH, vigorous LVF, EF 65-70%, apical AK consistent with apical hypertrophy variant, MAC   ASSESSMENT:     1. 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 Apical variant hypertrophic cardiomyopathy (HCC)   3. Abnormal echocardiogram   4. HLD (hyperlipidemia)   5. Hypertensive heart disease without heart failure   6. Chronic renal insufficiency, stage II (mild)     PLAN:     In order of problems listed above:  1. CAD -   He has chronic stable angina.  His symptoms are somewhat limiting for him.  He remains on Amlodipine, Labetalol, ASA.  We will continue with medical therapy for now.  2. Apical variant HCM - Continue beta-blocker.  Echo recently does not demonstrate apical thickening.   3. Abnormal Echo - His echo shows a density attached to the non-coronary cusp of the AV.  As noted, I reviewed with Dr. Verdis English.  Will arrange TEE.  Risks and benefits discussed with patient.     4. HL - LDL in 3/17 was 100. He was recently changed to Crestor.  Lipids and LFTs to be repeated in 5/17.    5. HTN - BP still elevated. Increase Hydralazine to 50 mg TID.   6. CKD - Recent Creatinine stable.    Medication Adjustments/Labs and Tests Ordered: Current medicines are reviewed at length with the patient today.  Concerns regarding medicines are outlined above.  Medication changes, Labs and Tests ordered today are outlined in the Patient Instructions noted below. Patient Instructions  Medication Instructions:  1. INCREASE HYDRALAZINE 50 MG THREE TIMES DAILY; NEW RX SENT  Labwork: TODAY BMET  Testing/Procedures: Your  physician has requested that you have a TEE. During a TEE, sound waves are used to create images of your heart. It provides your doctor with information about the size  and shape of your heart and how well your heart's chambers and valves are working. In this test, a transducer is attached to the end of a flexible tube that's guided down your throat and into your esophagus (the tube leading from you mouth to your stomach) to get a more detailed image of your heart. You are not awake for the procedure. Please see the instruction sheet given to you today. For further information please visit [email or web address]  Follow-Up: DR. Katrinka Blazing 3 MONTHS   Any Other Special Instructions Will Be Listed Below (If Applicable). If you need a refill on your cardiac medications before your next appointment, please call your pharmacy.    Signed, Tereso Newcomer, PA-C  08/21/2015 4:57 PM    [facility] [address] [place], [place], [place]  [address] Phone: [phone]; Fax: [phone]

## 2015-08-22 NOTE — Progress Notes (Signed)
  Echocardiogram Echocardiogram Transesophageal has been performed.  Janalyn Harder 08/22/2015, 2:13 PM

## 2015-08-22 NOTE — CV Procedure (Signed)
    Transesophageal Echocardiogram Note  [name] [id number] August 26, 1960  Procedure: Transesophageal Echocardiogram Indications: abnormal TTE  Procedure Details Consent: Obtained Time Out: Verified patient identification, verified procedure, site/side was marked, verified correct patient position, special equipment/implants available, Radiology Safety Procedures followed,  medications/allergies/relevent history reviewed, required imaging and test results available.  Performed  Medications:  During this procedure the patient is administered a total of Versed 5 mg and Fentanyl 75  mcg  to achieve and maintain moderate conscious sedation.  The patient's heart rate, blood pressure, and oxygen saturation are monitored continuously during the procedure. The period of conscious sedation is 30 minutes, of which I was present face-to-face 100% of this time.  Left Ventrical:  Normal LV function ,  +  LVH   Mitral Valve: normal ,   Leaflets are slightly myxomatous.   Very minimal prolapse of one of the scallops  Aortic Valve: normal.   Lambl's Exrescence, no vegetation ,  No   Masses    Tricuspid Valve: normal   Pulmonic Valve: normal   Left Atrium/ Left atrial appendage: large, no thrombi   Atrial septum: intact by color flow   Aorta: normal    Complications: No apparent complications Patient did not tolerate procedure well.   Vesta Mixer, Montez Hageman., MD, [facility] 08/22/2015, 2:04 PM

## 2015-08-24 ENCOUNTER — Encounter (HOSPITAL_COMMUNITY): Payer: Self-pay | Admitting: Cardiovascular Disease

## 2015-08-25 ENCOUNTER — Telehealth: Payer: Self-pay | Admitting: *Deleted

## 2015-08-25 ENCOUNTER — Encounter: Payer: Self-pay | Admitting: Physician Assistant

## 2015-08-25 NOTE — Telephone Encounter (Signed)
Pt has given permission ok to s/w wife. Wife aware of TEE results by phone with verbal understanding.

## 2015-10-03 ENCOUNTER — Other Ambulatory Visit (INDEPENDENT_AMBULATORY_CARE_PROVIDER_SITE_OTHER): Payer: BLUE CROSS/BLUE SHIELD | Admitting: *Deleted

## 2015-10-03 DIAGNOSIS — I25119 Atherosclerotic heart disease of native coronary artery with unspecified angina pectoris: Secondary | ICD-10-CM | POA: Diagnosis not present

## 2015-10-03 DIAGNOSIS — E785 Hyperlipidemia, unspecified: Secondary | ICD-10-CM

## 2015-10-03 LAB — LIPID PANEL
CHOL/HDL RATIO: 3.4 ratio (ref <=5.0)
Cholesterol: 146 mg/dL (ref 125–200)
HDL: 43 mg/dL (ref >=40)
LDL Cholesterol: 93 mg/dL (ref <130)
TRIGLYCERIDES: 49 mg/dL (ref <150)
VLDL: 10 mg/dL (ref <30)

## 2015-10-03 LAB — HEPATIC FUNCTION PANEL
ALBUMIN: 4.4 g/dL (ref 3.6–5.1)
ALK PHOS: 74 U/L (ref 40–115)
ALT: 12 U/L (ref 9–46)
AST: 18 U/L (ref 10–35)
Bilirubin, Direct: 0.2 mg/dL (ref <=0.2)
Indirect Bilirubin: 0.6 mg/dL (ref 0.2–1.2)
TOTAL PROTEIN: 6.9 g/dL (ref 6.1–8.1)
Total Bilirubin: 0.8 mg/dL (ref 0.2–1.2)

## 2015-10-06 ENCOUNTER — Telehealth: Payer: Self-pay | Admitting: *Deleted

## 2015-10-06 DIAGNOSIS — I25119 Atherosclerotic heart disease of native coronary artery with unspecified angina pectoris: Secondary | ICD-10-CM

## 2015-10-06 DIAGNOSIS — E785 Hyperlipidemia, unspecified: Secondary | ICD-10-CM

## 2015-10-06 MED ORDER — ROSUVASTATIN CALCIUM 40 MG PO TABS: 40.0000 mg | ORAL_TABLET | Freq: Every day | ORAL | Status: DC

## 2015-10-06 NOTE — Telephone Encounter (Signed)
Pt notified of lab results and is agreeable to plan of care to increase Crestor to 40 mg daily, LFT/FLP 8/8. New Rx sent in for Crestor 40 mg .

## 2015-10-15 ENCOUNTER — Encounter (HOSPITAL_COMMUNITY): Payer: Self-pay | Admitting: Emergency Medicine

## 2015-10-15 ENCOUNTER — Emergency Department (HOSPITAL_COMMUNITY): Payer: BLUE CROSS/BLUE SHIELD

## 2015-10-15 ENCOUNTER — Emergency Department (HOSPITAL_COMMUNITY)
Admission: EM | Admit: 2015-10-15 | Discharge: 2015-10-15 | Disposition: A | Discharge status: Home / Self Care | Payer: BLUE CROSS/BLUE SHIELD | Attending: Emergency Medicine | Admitting: Emergency Medicine

## 2015-10-15 DIAGNOSIS — M25532 Pain in left wrist: Secondary | ICD-10-CM | POA: Insufficient documentation

## 2015-10-15 DIAGNOSIS — Z8673 Personal history of transient ischemic attack (TIA), and cerebral infarction without residual deficits: Secondary | ICD-10-CM | POA: Insufficient documentation

## 2015-10-15 DIAGNOSIS — E785 Hyperlipidemia, unspecified: Secondary | ICD-10-CM | POA: Diagnosis not present

## 2015-10-15 DIAGNOSIS — I2510 Atherosclerotic heart disease of native coronary artery without angina pectoris: Secondary | ICD-10-CM | POA: Insufficient documentation

## 2015-10-15 DIAGNOSIS — H9190 Unspecified hearing loss, unspecified ear: Secondary | ICD-10-CM | POA: Insufficient documentation

## 2015-10-15 DIAGNOSIS — G8929 Other chronic pain: Secondary | ICD-10-CM | POA: Diagnosis not present

## 2015-10-15 DIAGNOSIS — Z9889 Other specified postprocedural states: Secondary | ICD-10-CM | POA: Diagnosis not present

## 2015-10-15 DIAGNOSIS — I10 Essential (primary) hypertension: Secondary | ICD-10-CM | POA: Diagnosis not present

## 2015-10-15 DIAGNOSIS — Z7982 Long term (current) use of aspirin: Secondary | ICD-10-CM | POA: Diagnosis not present

## 2015-10-15 DIAGNOSIS — Z79899 Other long term (current) drug therapy: Secondary | ICD-10-CM | POA: Diagnosis not present

## 2015-10-15 DIAGNOSIS — F17210 Nicotine dependence, cigarettes, uncomplicated: Secondary | ICD-10-CM | POA: Diagnosis not present

## 2015-10-15 MED ORDER — METHOCARBAMOL 500 MG PO TABS: 500.0000 mg | ORAL_TABLET | Freq: Two times a day (BID) | ORAL | Status: DC

## 2015-10-15 MED ORDER — NAPROXEN 500 MG PO TABS: 500.0000 mg | ORAL_TABLET | Freq: Two times a day (BID) | ORAL | Status: DC

## 2015-10-15 NOTE — ED Notes (Signed)
Pt reports left wrist pain x 3 months, no injury reported. Pt reports pain radiates up his arm.

## 2015-10-15 NOTE — ED Provider Notes (Signed)
History  By signing my name below, I, Earmon Phoenix, attest that this documentation has been prepared under the direction and in the presence of Fayrene Helper, PA-C. Electronically Signed: Earmon Phoenix, ED Scribe. 10/15/2015. 3:15 PM.  Chief Complaint  Patient presents with  . Arm Pain   The history is provided by the patient and medical records. No language interpreter was used.    HPI Comments:  [name] is a 55 y.o. male who presents to the Emergency Department complaining of waxing and waning left wrist throbbing that began about one month ago. He states the pain is now traveling up his entire arm and is sharp and shooting. 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 Movement of any kind of the left wrist increases the pain. He denies alleviating factors. He has not taken anything for pain. He states he used to wrap the left wrist but states it is too painful now. He denies trauma, injury or fall. He denies numbness, tingling or weakness of the left hand or LUE, bruising, wounds, fever or chills.   Past Medical History  Diagnosis Date  . CAD (coronary artery disease)     a. LHC in 2011 with severe dz in D1/D2 and very distal LAD--> too small for PCI and managed medically. b.  LHC in 10/2013 after an abnormal nuclear study w/ non-obst dz/  . Hypertension   . Hyperlipidemia   . HOH (hard of hearing)     rgiht ear  . Stroke (HCC)   . Migraine headache   . Chronic back pain   . Apical variant hypertrophic cardiomyopathy (HCC)   . 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   Past Surgical History  Procedure Laterality Date  . Inguinal hernia repair    . Left heart catheterization with coronary angiogram N/A  11/22/2013    Procedure: LEFT HEART CATHETERIZATION WITH CORONARY ANGIOGRAM;  Surgeon: [name], MD;  Location: [facility] CATH LAB;  Service: Cardiovascular;  Laterality: N/A;  . Tee without cardioversion N/A 08/22/2015    Procedure: TRANSESOPHAGEAL ECHOCARDIOGRAM (TEE);  Surgeon: Vesta Mixer, MD;  Location: [facility] ENDOSCOPY;  Service: Cardiovascular;  Laterality: N/A;   Family History  Problem Relation Age of Onset  . Hypertension Mother   . Heart failure Mother   . Hypertension Father   . Colon cancer Father 35  . Colon cancer Paternal Uncle    Social History  Substance Use Topics  . Smoking status: Current Some Day Smoker -- 0.00 packs/day    Types: Cigarettes  . Smokeless tobacco: Never Used  . Alcohol Use: No    Review of Systems  Constitutional: Negative for fever and chills.  Musculoskeletal: Positive for arthralgias.  Skin: Negative for color change and wound.  Neurological: Negative for weakness and numbness.    Allergies  Lisinopril  Home Medications   Prior to Admission medications   Medication Sig Start Date End Date Taking? Authorizing Provider  amLODipine (NORVASC) 10 MG tablet TAKE 1 TABLET BY MOUTH EVERY DAY 08/08/15   Lyn Records, MD  aspirin EC 81 MG tablet Take 2 tablets (162 mg total) by mouth daily. Patient taking differently: Take 81 mg by mouth daily.  07/02/14  Eda Paschal Kilroy, PA-C  hydrALAZINE (APRESOLINE) 50 MG tablet Take 1 tablet (50 mg total) by mouth 3 (three) times daily. 08/21/15   Beatrice Lecher, PA-C  ibuprofen (ADVIL,MOTRIN) 800 MG tablet Take 1 tablet (800 mg total) by mouth every 8 (eight) hours as needed. 08/16/15   Charlestine Night, PA-C  labetalol (NORMODYNE) 300 MG tablet TAKE 1 TABLET BY MOUTH TWICE A DAY 08/12/15   Lyn Records, MD  olmesartan (BENICAR) 40 MG tablet Take 1 tablet (40 mg total) by mouth daily. 07/24/15   Lyn Records, MD  rosuvastatin (CRESTOR) 40 MG tablet Take 1 tablet (40 mg total) by mouth daily. 10/06/15   Beatrice Lecher, PA-C   Triage Vitals: BP 170/104 mmHg  Pulse 66  Temp(Src) 98.7 F (37.1 C) (Oral)  Resp 16  SpO2 97% Physical Exam  Constitutional: He is oriented to person, place, and time. He appears well-developed and well-nourished.  HENT:  Head: Normocephalic and atraumatic.  Eyes: EOM are normal.  Neck: Normal range of motion.  Cardiovascular: Normal rate.   Left radial pulse 2+. Brisk cap refill of left hand.  Pulmonary/Chest: Effort normal.  Musculoskeletal: Normal range of motion.  Left shoulder and left elbow normal. Point tenderness noted to radial aspect of left wrist. Decreased left wrist flexion, extension, supination and pronation with passive and active ROM. Left forearm compartment is soft.  Neurological: He is alert and oriented to person, place, and time.  Sensations intact throughout left hand, fingers and arm.  Skin: Skin is warm and dry.  Psychiatric: He has a normal mood and affect. His behavior is normal.  Nursing note and vitals reviewed.   ED Course  Procedures (including critical care time) DIAGNOSTIC STUDIES: Oxygen Saturation is 97% on RA, normal by my interpretation.   COORDINATION OF CARE: 2:21 PM- L wrist pain, likely extensor tenosynovitis vs carpal tunnel syndrome. Will X-Ray left wrist. Pt verbalizes understanding and agrees to plan.  3:25 PM Xray neg.  Wrist splint provided for support.  Pt's BP is elevated, recommend f/u with PCP for recheck.  Muscle relaxant and NSAIDs prescribed.  Hand referral given as needed.  Return precaution discussed.    Medications - No data to display  Labs Review Labs Reviewed - No data to display  Imaging Review Dg Wrist Complete Left  10/15/2015  CLINICAL DATA:  Left wrist pain for 3 months, no known injury, initial encounter EXAM: LEFT WRIST - COMPLETE 3+ VIEW COMPARISON:  None. FINDINGS: There is no evidence of fracture or dislocation. There is no evidence of arthropathy or other focal bone abnormality. Soft  tissues show a radiopaque foreign body in the distal forearm likely of a chronic nature. IMPRESSION: No acute abnormality noted. Electronically Signed   By: Alcide Clever M.D.   On: 10/15/2015 15:12   I have personally reviewed and evaluated these images and lab results as part of my medical decision-making.   EKG Interpretation None      MDM   Final diagnoses:  Left wrist pain    BP 170/104 mmHg  Pulse 66  Temp(Src) 98.7 F (37.1 C) (Oral)  Resp 16  SpO2 97%   I personally performed the services described in this documentation, which was scribed in my presence. The recorded information has been reviewed and is accurate.       Fayrene Helper, PA-C 10/15/15 1531  Melene Plan, DO 10/15/15 [phone]

## 2015-10-15 NOTE — Discharge Instructions (Signed)
Your wrist pain may be due to inflamed tendon.  Wear wrist splint for support.  Take medication prescribed for pain.  Follow up with hand specialist for further management as it can also be carpal tunnel syndrome causing your wrist pain.    Wrist Pain There are many things that can cause wrist pain. Some common causes include:  An injury to the wrist area.  Overuse of the joint.  A condition that causes too much pressure to be put on a nerve in the wrist (carpal tunnel syndrome).  Wear and tear of the joints that happens as a person gets older (osteoarthritis).  Other types of arthritis. Sometimes, the cause is not known. The pain often goes away when you follow instructions from your doctor about relieving pain at home. If your wrist pain does not go away, tests may need to be done to find the cause. HOME CARE Pay attention to any changes in your symptoms. Take these actions to help with your pain:  Rest your wrist for at least 48 hours or as told by your doctor.  If your doctor tells you to, put ice on the injured area:  Put ice in a plastic bag.  Place a towel between your skin and the bag.  Leave the ice on for 20 minutes, 2-3 times per day.  Keep your arm raised (elevated) above the level of your heart while you are sitting or lying down.  If a splint or elastic bandage has been put on the injured area:  Wear it as told by your doctor.  Take the splint or bandage off only as told by your doctor.  Loosen the splint or bandage if your fingers lose feeling (are numb) or have a tingling feeling, or if they turn cold or blue.  Take over-the-counter and prescription medicines only as told by your doctor.  Keep all follow-up visits as told by your doctor. This is important. GET HELP IF:  Your pain is not helped by treatment.  Your pain gets worse. GET HELP RIGHT AWAY IF:   Your fingers swell.  Your fingers turn white, very red, or cold and blue.  Your fingers lose  feeling or have a tingling feeling.  You have trouble moving your fingers.   This information is not intended to replace advice given to you by your health care provider. Make sure you discuss any questions you have with your health care provider.   Document Released: 11/03/2007 Document Revised: 02/05/2015 Document Reviewed: 10/02/2014 Elsevier Interactive Patient Education [employer]

## 2015-10-15 NOTE — ED Notes (Signed)
Pt stable, ambulatory, states understanding of discharge instructions 

## 2015-10-23 DIAGNOSIS — M1812 Unilateral primary osteoarthritis of first carpometacarpal joint, left hand: Secondary | ICD-10-CM | POA: Insufficient documentation

## 2015-10-23 DIAGNOSIS — M25532 Pain in left wrist: Secondary | ICD-10-CM | POA: Insufficient documentation

## 2015-11-17 ENCOUNTER — Ambulatory Visit: Payer: BLUE CROSS/BLUE SHIELD | Admitting: Internal Medicine

## 2015-11-18 NOTE — Progress Notes (Signed)
Cardiology Office Note    Date:  11/19/2015   ID:  [name], DOB November 24, 1960, MRN [id number]  PCP:  Sanda Linger, MD  Cardiologist: [name], MD   Chief Complaint  Patient presents with  . Coronary Artery Disease    History of Present Illness:  [name] is a 55 y.o. 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  Overall he is doing well. He has no complaints. No neurological complaints. Dyspnea has improved. Compliant with current medical regimen. No episodes of syncope or medication side effects.    Past Medical History  Diagnosis Date  . CAD (coronary artery disease)     a. LHC in 2011 with severe dz in D1/D2 and very distal LAD--> too small for PCI and managed medically. b.  LHC in 10/2013 after an abnormal nuclear study w/ non-obst dz/  . Hypertension   . Hyperlipidemia   . HOH (hard of hearing)     rgiht ear  . Stroke (HCC)   . Migraine headache   . Chronic back pain   . Apical variant hypertrophic cardiomyopathy (HCC)   . 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    Past Surgical History  Procedure Laterality Date  . Inguinal hernia repair    . Left heart catheterization with coronary angiogram N/A 11/22/2013    Procedure: LEFT HEART CATHETERIZATION WITH CORONARY ANGIOGRAM;  Surgeon: [name], MD;  Location: [facility] CATH LAB;  Service: Cardiovascular;  Laterality: N/A;  . Tee without cardioversion N/A 08/22/2015    Procedure: TRANSESOPHAGEAL ECHOCARDIOGRAM (TEE);  Surgeon: Vesta Mixer, MD;  Location: [facility] ENDOSCOPY;   Service: Cardiovascular;  Laterality: N/A;    Current Medications: Outpatient Prescriptions Prior to Visit  Medication Sig Dispense Refill  . amLODipine (NORVASC) 10 MG tablet TAKE 1 TABLET BY MOUTH EVERY DAY 30 tablet 11  . hydrALAZINE (APRESOLINE) 50 MG tablet Take 1 tablet (50 mg total) by mouth 3 (three) times daily. 90 tablet 3  . labetalol (NORMODYNE) 300 MG tablet TAKE 1 TABLET BY MOUTH TWICE A DAY 60 tablet 10  . methocarbamol (ROBAXIN) 500 MG tablet Take 1 tablet (500 mg total) by mouth 2 (two) times daily. 20 tablet 0  . naproxen (NAPROSYN) 500 MG tablet Take 1 tablet (500 mg total) by mouth 2 (two) times daily. 30 tablet 0  . olmesartan (BENICAR) 40 MG tablet Take 1 tablet (40 mg total) by mouth daily. 30 tablet 11  . rosuvastatin (CRESTOR) 40 MG tablet Take 1 tablet (40 mg total) by mouth daily. 90 tablet 3  . aspirin EC 81 MG tablet Take 2 tablets (162 mg total) by mouth daily. (Patient not taking: Reported on 11/19/2015)     No facility-administered medications prior to visit.     Allergies:   Lisinopril   Social History   Social History  . Marital Status: Married    Spouse Name: N/A  . Number of Children: N/A  . Years of Education: N/A   Social History Main Topics  . Smoking status: Current Some Day Smoker -- 0.00 packs/day    Types: Cigarettes  . Smokeless tobacco: Never Used  .  Alcohol Use: No  . Drug Use: No  . Sexual Activity: Not Asked   Other Topics Concern  . None   Social History Narrative     Family History:  The patient's family history includes Colon cancer in his paternal uncle; Colon cancer (age of onset: 23) in his father; Heart failure in his mother; Hypertension in his father and mother.   ROS:   Please see the history of present illness.    Dyspnea on exertion, depression, occasional fleeting chest pain, and unexplained weight gain.  All other systems reviewed and are negative.   PHYSICAL EXAM:   VS:  BP 144/86 mmHg  Pulse 69  Ht 5'  8" (1.727 m)  Wt 195 lb 12.8 oz (88.814 kg)  BMI 29.78 kg/m2   GEN: Well nourished, well developed, in no acute distress HEENT: normal Neck: no JVD, carotid bruits, or masses Cardiac: RRR; no murmurs, rubs, or gallops,no edema  Respiratory:  clear to auscultation bilaterally, normal work of breathing GI: soft, nontender, nondistended, + BS MS: no deformity or atrophy Skin: warm and dry, no rash Neuro:  Alert and Oriented x 3, Strength and sensation are intact Psych: euthymic mood, full affect  Wt Readings from Last 3 Encounters:  11/19/15 195 lb 12.8 oz (88.814 kg)  08/22/15 198 lb (89.812 kg)  08/21/15 198 lb 12.8 oz (90.175 kg)      Studies/Labs Reviewed:   EKG:  EKG  Is not performed on today's visit  Recent Labs: 08/21/2015: BUN 12; Creat 1.27; Potassium 4.3; Sodium 138 10/03/2015: ALT 12   Lipid Panel    Component Value Date/Time   CHOL 146 10/03/2015 0936   TRIG 49 10/03/2015 0936   HDL 43 10/03/2015 0936   CHOLHDL 3.4 10/03/2015 0936   VLDL 10 10/03/2015 0936   LDLCALC 93 10/03/2015 0936    Additional studies/ records that were reviewed today include:   Reviewed the most recent echo report. This was a transesophageal echo performed on 08/22/15 Study Conclusions  - Left ventricle: The cavity size was normal. Wall thickness was  increased in a pattern of moderate LVH. Systolic function was  normal. - Aortic valve: No evidence of vegetation. - Mitral valve: No evidence of vegetation. - Left atrium: No evidence of thrombus in the atrial cavity or  appendage. - Atrial septum: No defect or patent foramen ovale was identified. Please note there was no evidence of a Lambl's excresence.  ASSESSMENT:    1. 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 Essential hypertension   3. Coronary artery disease involving native coronary artery of native heart with other form of angina pectoris (HCC)   4.  Hypertensive heart disease without heart failure      PLAN:  In order of problems listed above:  1. Asymptomatic. No change in therapy is required at this time. 2. 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 He needs low-salt diet and to measure his blood pressure at least once per month. His target is less than 140/90 mmHg.    Medication Adjustments/Labs and Tests Ordered: Current medicines are reviewed at length with the patient today.  Concerns regarding medicines are outlined above.  Medication changes, Labs and Tests ordered today are listed in the Patient Instructions below. There are no Patient Instructions on file for this visit.   Signed, [name], MD  11/19/2015 11:28 AM  [facility] [address], [place], [place]  [address] Phone: [phone]; Fax: [phone]

## 2015-11-19 ENCOUNTER — Other Ambulatory Visit (INDEPENDENT_AMBULATORY_CARE_PROVIDER_SITE_OTHER): Payer: BLUE CROSS/BLUE SHIELD

## 2015-11-19 ENCOUNTER — Encounter: Payer: Self-pay | Admitting: Interventional Cardiology

## 2015-11-19 ENCOUNTER — Ambulatory Visit (INDEPENDENT_AMBULATORY_CARE_PROVIDER_SITE_OTHER): Payer: BLUE CROSS/BLUE SHIELD | Admitting: Internal Medicine

## 2015-11-19 ENCOUNTER — Ambulatory Visit (INDEPENDENT_AMBULATORY_CARE_PROVIDER_SITE_OTHER): Payer: BLUE CROSS/BLUE SHIELD | Admitting: Interventional Cardiology

## 2015-11-19 ENCOUNTER — Encounter: Payer: Self-pay | Admitting: Internal Medicine

## 2015-11-19 VITALS — BP 144/86 | HR 69 | Ht 68.0 in | Wt 195.8 lb

## 2015-11-19 VITALS — BP 122/84 | HR 67 | Temp 98.5°F | Resp 16 | Ht 68.0 in | Wt 194.0 lb

## 2015-11-19 DIAGNOSIS — I25119 Atherosclerotic heart disease of native coronary artery with unspecified angina pectoris: Secondary | ICD-10-CM | POA: Diagnosis not present

## 2015-11-19 DIAGNOSIS — E519 Thiamine deficiency, unspecified: Secondary | ICD-10-CM | POA: Insufficient documentation

## 2015-11-19 DIAGNOSIS — I25118 Atherosclerotic heart disease of native coronary artery with other forms of angina pectoris: Secondary | ICD-10-CM | POA: Diagnosis not present

## 2015-11-19 DIAGNOSIS — I10 Essential (primary) hypertension: Secondary | ICD-10-CM | POA: Diagnosis not present

## 2015-11-19 DIAGNOSIS — R9089 Other abnormal findings on diagnostic imaging of central nervous system: Secondary | ICD-10-CM | POA: Insufficient documentation

## 2015-11-19 DIAGNOSIS — N189 Chronic kidney disease, unspecified: Secondary | ICD-10-CM

## 2015-11-19 DIAGNOSIS — D539 Nutritional anemia, unspecified: Secondary | ICD-10-CM | POA: Diagnosis not present

## 2015-11-19 DIAGNOSIS — N182 Chronic kidney disease, stage 2 (mild): Secondary | ICD-10-CM

## 2015-11-19 DIAGNOSIS — F32A Depression, unspecified: Secondary | ICD-10-CM | POA: Insufficient documentation

## 2015-11-19 DIAGNOSIS — R930 Abnormal findings on diagnostic imaging of skull and head, not elsewhere classified: Secondary | ICD-10-CM

## 2015-11-19 DIAGNOSIS — I119 Hypertensive heart disease without heart failure: Secondary | ICD-10-CM

## 2015-11-19 DIAGNOSIS — R519 Headache, unspecified: Secondary | ICD-10-CM | POA: Insufficient documentation

## 2015-11-19 DIAGNOSIS — R51 Headache: Secondary | ICD-10-CM

## 2015-11-19 DIAGNOSIS — F450 Somatization disorder: Secondary | ICD-10-CM

## 2015-11-19 DIAGNOSIS — F329 Major depressive disorder, single episode, unspecified: Secondary | ICD-10-CM | POA: Insufficient documentation

## 2015-11-19 LAB — CBC WITH DIFFERENTIAL/PLATELET
BASOS PCT: 0.5 % (ref 0.0–3.0)
Basophils Absolute: 0 10*3/uL (ref 0.0–0.1)
EOS PCT: 4.2 % (ref 0.0–5.0)
Eosinophils Absolute: 0.3 10*3/uL (ref 0.0–0.7)
HCT: 36.7 % — ABNORMAL LOW (ref 39.0–52.0)
Hemoglobin: 12.1 g/dL — ABNORMAL LOW (ref 13.0–17.0)
LYMPHS ABS: 1.7 10*3/uL (ref 0.7–4.0)
Lymphocytes Relative: 25.5 % (ref 12.0–46.0)
MCHC: 33 g/dL (ref 30.0–36.0)
MCV: 89.9 fl (ref 78.0–100.0)
MONOS PCT: 7 % (ref 3.0–12.0)
Monocytes Absolute: 0.5 10*3/uL (ref 0.1–1.0)
NEUTROS ABS: 4.3 10*3/uL (ref 1.4–7.7)
NEUTROS PCT: 62.8 % (ref 43.0–77.0)
PLATELETS: 252 10*3/uL (ref 150.0–400.0)
RBC: 4.09 Mil/uL — AB (ref 4.22–5.81)
RDW: 12.7 % (ref 11.5–15.5)
WBC: 6.9 10*3/uL (ref 4.0–10.5)

## 2015-11-19 LAB — IBC PANEL
Iron: 70 ug/dL (ref 42–165)
Saturation Ratios: 21.6 % (ref 20.0–50.0)
Transferrin: 231 mg/dL (ref 212.0–360.0)

## 2015-11-19 LAB — BASIC METABOLIC PANEL
BUN: 12 mg/dL (ref 6–23)
CHLORIDE: 106 meq/L (ref 96–112)
CO2: 28 meq/L (ref 19–32)
CREATININE: 1.25 mg/dL (ref 0.40–1.50)
Calcium: 9.4 mg/dL (ref 8.4–10.5)
GFR: 77.36 mL/min (ref >60.00)
Glucose, Bld: 84 mg/dL (ref 70–99)
Potassium: 3.8 mEq/L (ref 3.5–5.1)
Sodium: 137 mEq/L (ref 135–145)

## 2015-11-19 LAB — RETICULOCYTES
ABS Retic: 45210 cells/uL (ref 25000–90000)
RBC.: 4.11 MIL/uL — ABNORMAL LOW (ref 4.20–5.80)
RETIC CT PCT: 1.1 %

## 2015-11-19 LAB — FOLATE: FOLATE: 12.6 ng/mL (ref >5.9)

## 2015-11-19 LAB — VITAMIN B12: Vitamin B-12: 461 pg/mL (ref 211–911)

## 2015-11-19 LAB — FERRITIN: FERRITIN: 93.1 ng/mL (ref 22.0–322.0)

## 2015-11-19 MED ORDER — VORTIOXETINE HBR 10 MG PO TABS: 1.0000 | ORAL_TABLET | Freq: Every day | ORAL | Status: DC

## 2015-11-19 NOTE — Patient Instructions (Signed)
Major Depressive Disorder Major depressive disorder is a mental illness. It also may be called clinical depression or unipolar depression. Major depressive disorder usually causes feelings of sadness, hopelessness, or helplessness. Some people with this disorder do not feel particularly sad but lose interest in doing things they used to enjoy (anhedonia). Major depressive disorder also can cause physical symptoms. It can interfere with work, school, relationships, and other normal everyday activities. The disorder varies in severity but is longer lasting and more serious than the sadness we all feel from time to time in our lives. Major depressive disorder often is triggered by stressful life events or major life changes. Examples of these triggers include divorce, loss of your job or home, a move, and the death of a family member or close friend. Sometimes this disorder occurs for no obvious reason at all. People who have family members with major depressive disorder or bipolar disorder are at higher risk for developing this disorder, with or without life stressors. Major depressive disorder can occur at any age. It may occur just once in your life (single episode major depressive disorder). It may occur multiple times (recurrent major depressive disorder). SYMPTOMS People with major depressive disorder have either anhedonia or depressed mood on nearly a daily basis for at least 2 weeks or longer. Symptoms of depressed mood include:  Feelings of sadness (blue or down in the dumps) or emptiness.  Feelings of hopelessness or helplessness.  Tearfulness or episodes of crying (may be observed by others).  Irritability (children and adolescents). In addition to depressed mood or anhedonia or both, people with this disorder have at least four of the following symptoms:  Difficulty sleeping or sleeping too much.   Significant change (increase or decrease) in appetite or weight.   Lack of energy or  motivation.  Feelings of guilt and worthlessness.   Difficulty concentrating, remembering, or making decisions.  Unusually slow movement (psychomotor retardation) or restlessness (as observed by others).   Recurrent wishes for death, recurrent thoughts of self-harm (suicide), or a suicide attempt. People with major depressive disorder commonly have persistent negative thoughts about themselves, other people, and the world. People with severe major depressive disorder may experiencedistorted beliefs or perceptions about the world (psychotic delusions). They also may see or hear things that are not real (psychotic hallucinations). DIAGNOSIS Major depressive disorder is diagnosed through an assessment by your health care provider. Your health care provider will ask aboutaspects of your daily life, such as mood,sleep, and appetite, to see if you have the diagnostic symptoms of major depressive disorder. Your health care provider may ask about your medical history and use of alcohol or drugs, including prescription medicines. Your health care provider also may do a physical exam and blood work. This is because certain medical conditions and the use of certain substances can cause major depressive disorder-like symptoms (secondary depression). Your health care provider also may refer you to a mental health specialist for further evaluation and treatment. TREATMENT It is important to recognize the symptoms of major depressive disorder and seek treatment. The following treatments can be prescribed for this disorder:   Medicine. Antidepressant medicines usually are prescribed. Antidepressant medicines are thought to correct chemical imbalances in the brain that are commonly associated with major depressive disorder. Other types of medicine may be added if the symptoms do not respond to antidepressant medicines alone or if psychotic delusions or hallucinations occur.  Talk therapy. Talk therapy can be  helpful in treating major depressive disorder by providing   support, education, and guidance. Certain types of talk therapy also can help with negative thinking (cognitive behavioral therapy) and with relationship issues that trigger this disorder (interpersonal therapy). A mental health specialist can help determine which treatment is best for you. Most people with major depressive disorder do well with a combination of medicine and talk therapy. Treatments involving electrical stimulation of the brain can be used in situations with extremely severe symptoms or when medicine and talk therapy do not work over time. These treatments include electroconvulsive therapy, transcranial magnetic stimulation, and vagal nerve stimulation.   This information is not intended to replace advice given to you by your health care provider. Make sure you discuss any questions you have with your health care provider.   Document Released: 09/11/2012 Document Revised: 06/07/2014 Document Reviewed: 09/11/2012 Elsevier Interactive Patient Education 2016 [employer]  

## 2015-11-19 NOTE — Progress Notes (Signed)
Subjective:  Patient ID: [name], male    DOB: 1960/10/19  Age: 55 y.o. MRN: [id number]  CC: Anemia   HPI [name] presents for a follow-up on anemia and concerns about his memory. He states for the last month or 2 he has had episodes that he describes as driving home and suddenly feeling like he doesn't know how to get home. He will pull over to the side of the road and get his thoughts together and eventually drive himself home. This is all associated with multiple other symptoms including difficulty falling asleep, anhedonia, and feeling worthless/helpless/hopeless over the last year. He has had a slight loss of appetite, chronic/intermittent headaches, a few episodes of constipation, and slight weight loss. He denies suicidal or homicidal ideations.  Outpatient Prescriptions Prior to Visit  Medication Sig Dispense Refill  . amLODipine (NORVASC) 10 MG tablet TAKE 1 TABLET BY MOUTH EVERY DAY 30 tablet 11  . aspirin 81 MG tablet Take 81 mg by mouth daily.    . hydrALAZINE (APRESOLINE) 50 MG tablet Take 1 tablet (50 mg total) by mouth 3 (three) times daily. 90 tablet 3  . labetalol (NORMODYNE) 300 MG tablet TAKE 1 TABLET BY MOUTH TWICE A DAY 60 tablet 10  . olmesartan (BENICAR) 40 MG tablet Take 1 tablet (40 mg total) by mouth daily. 30 tablet 11  . rosuvastatin (CRESTOR) 40 MG tablet Take 1 tablet (40 mg total) by mouth daily. 90 tablet 3  . methocarbamol (ROBAXIN) 500 MG tablet Take 1 tablet (500 mg total) by mouth 2 (two) times daily. 20 tablet 0  . naproxen (NAPROSYN) 500 MG tablet Take 1 tablet (500 mg total) by mouth 2 (two) times daily. 30 tablet 0   No facility-administered medications prior to visit.    ROS Review of Systems  Constitutional: Positive for activity change, appetite change and unexpected weight change. Negative for diaphoresis and fatigue.  HENT: Negative for trouble swallowing.   Eyes: Negative.   Respiratory: Negative.  Negative for cough,  choking, chest tightness, shortness of breath and stridor.   Cardiovascular: Negative.  Negative for chest pain, palpitations and leg swelling.  Gastrointestinal: Positive for constipation. Negative for nausea, vomiting, abdominal pain, diarrhea and anal bleeding.  Endocrine: Negative.   Genitourinary: Negative.   Musculoskeletal: Negative.   Skin: Negative.   Allergic/Immunologic: Negative.   Neurological: Positive for headaches. Negative for dizziness, tremors, seizures, syncope, facial asymmetry, speech difficulty, weakness, light-headedness and numbness.  Hematological: Negative.  Negative for adenopathy. Does not bruise/bleed easily.  Psychiatric/Behavioral: Positive for confusion, sleep disturbance, dysphoric mood and decreased concentration. Negative for suicidal ideas, hallucinations and self-injury. The patient is not nervous/anxious and is not hyperactive.     Objective:  Ht 5\' 8"  (1.727 m)  Wt 194 lb (87.998 kg)  BMI 29.50 kg/m2  BP Readings from Last 3 Encounters:  11/19/15 144/86  10/15/15 148/91  08/22/15 130/76    Wt Readings from Last 3 Encounters:  11/19/15 194 lb (87.998 kg)  11/19/15 195 lb 12.8 oz (88.814 kg)  08/22/15 198 lb (89.812 kg)    Physical Exam  Constitutional: He is oriented to person, place, and time. No distress.  HENT:  Mouth/Throat: Oropharynx is clear and moist. No oropharyngeal exudate.  Eyes: Conjunctivae and EOM are normal. Pupils are equal, round, and reactive to light. Right eye exhibits no discharge. Left eye exhibits no discharge. No scleral icterus.  Neck: Normal range of motion. Neck supple. No JVD present. No tracheal deviation present. No  thyromegaly present.  Cardiovascular: Normal rate, regular rhythm, normal heart sounds and intact distal pulses.  Exam reveals no gallop and no friction rub.   No murmur heard. Pulmonary/Chest: Effort normal and breath sounds normal. No stridor. No respiratory distress. He has no wheezes. He has no  rales. He exhibits no tenderness.  Abdominal: Soft. Bowel sounds are normal. He exhibits no distension and no mass. There is no tenderness. There is no rebound and no guarding.  Musculoskeletal: Normal range of motion. He exhibits no edema or tenderness.  Lymphadenopathy:    He has no cervical adenopathy.  Neurological: He is alert and oriented to person, place, and time. He has normal reflexes. He displays normal reflexes. No cranial nerve deficit. He exhibits normal muscle tone. Coordination normal.  Skin: Skin is warm and dry. No rash noted. He is not diaphoretic. No erythema. No pallor.  Psychiatric: Judgment and thought content normal. His mood appears not anxious. His affect is not angry, not blunt, not labile and not inappropriate. His speech is not rapid and/or pressured, not delayed and not tangential. He is withdrawn. He is not agitated, not slowed, not actively hallucinating and not combative. Cognition and memory are normal. He exhibits a depressed mood. He expresses no homicidal and no suicidal ideation. He expresses no suicidal plans and no homicidal plans. He is attentive.  Vitals reviewed.   Lab Results  Component Value Date   WBC 6.9 11/19/2015   HGB 12.1* 11/19/2015   HCT 36.7* 11/19/2015   PLT 252.0 11/19/2015   GLUCOSE 84 11/19/2015   CHOL 146 10/03/2015   TRIG 49 10/03/2015   HDL 43 10/03/2015   LDLCALC 93 10/03/2015   ALT 12 10/03/2015   AST 18 10/03/2015   NA 137 11/19/2015   K 3.8 11/19/2015   CL 106 11/19/2015   CREATININE 1.25 11/19/2015   BUN 12 11/19/2015   CO2 28 11/19/2015   TSH 0.47 08/27/2014   PSA 1.77 08/27/2014   INR 1.1* 11/20/2013   HGBA1C  09/17/2009    5.1 (NOTE)                                                                       According to the ADA Clinical Practice Recommendations for 2011, when HbA1c is used as a screening test:   >=6.5%   Diagnostic of Diabetes Mellitus           (if abnormal result  is confirmed)  5.7-6.4%   Increased  risk of developing Diabetes Mellitus  References:Diagnosis and Classification of Diabetes Mellitus,Diabetes Care,2011,34(Suppl 1):S62-S69 and Standards of Medical Care in         Diabetes - 2011,Diabetes Care,2011,34  (Suppl 1):S11-S61.    Dg Wrist Complete Left  10/15/2015  CLINICAL DATA:  Left wrist pain for 3 months, no known injury, initial encounter EXAM: LEFT WRIST - COMPLETE 3+ VIEW COMPARISON:  None. FINDINGS: There is no evidence of fracture or dislocation. There is no evidence of arthropathy or other focal bone abnormality. Soft tissues show a radiopaque foreign body in the distal forearm likely of a chronic nature. IMPRESSION: No acute abnormality noted. Electronically Signed   By: Alcide Clever M.D.   On: 10/15/2015 15:12    Assessment & Plan:  Bradin was seen today for anemia.  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 -     CBC with Differential/Platelet; Future -     IBC panel; Future -     Folate; Future -     Ferritin; Future -     Reticulocytes; Future -     Vitamin B12; Future -     Vitamin B1; Future  Intractable episodic headache, unspecified headache type - he has a history of migraine headaches in his recent headaches are not any different, -     [name]; Future  Abnormal brain MRI- he had an MRI about 3 or 4 years ago that showed suspicion for demyelination, I will recheck his MRI at this time to see if there have been any changes -     [name]; Future  Chronic renal insufficiency, stage II (mild)- renal function has improved, he will continue to avoid nephrotoxic agents. -     Basic metabolic panel; Future  Essential hypertension- his blood pressures well controlled, electrolytes and renal function are stable. -     Basic metabolic panel; Future  Depression with somatization- I think he  would benefit from starting Trintellix both for the element of depression as well as improving his cognitive functioning. -     vortioxetine HBr (TRINTELLIX) 10 MG TABS; Take 1 tablet (10 mg total) by mouth daily.   I have discontinued Mr. Rita's naproxen and methocarbamol. I am also having him start on vortioxetine HBr. Additionally, I am having him maintain his olmesartan, amLODipine, labetalol, hydrALAZINE, rosuvastatin, and aspirin.  Meds ordered this encounter  Medications  . vortioxetine HBr (TRINTELLIX) 10 MG TABS    Sig: Take 1 tablet (10 mg total) by mouth daily.    Dispense:  28 tablet    Refill:  0     Follow-up: Return in about 3 weeks (around 12/10/2015).  Sanda Linger, MD

## 2015-11-19 NOTE — Progress Notes (Signed)
Pre visit review using our clinic review tool, if applicable. No additional management support is needed unless otherwise documented below in the visit note. 

## 2015-11-19 NOTE — Patient Instructions (Signed)
Medication Instructions:   NO CHANGE  Follow-Up:  Your physician wants you to follow-up in: 8-12 MONTHS WITH DR Marlou Starks will receive a reminder letter in the mail two months in advance. If you don't receive a letter, please call our office to schedule the follow-up appointment.   If you need a refill on your cardiac medications before your next appointment, please call your pharmacy.   MONITOR BLOOD PRESSURE ONCE MONTHLY= GOAL IS LESS THAQN 140/90  Low-Sodium Eating Plan Sodium raises blood pressure and causes water to be held in the body. Getting less sodium from food will help lower your blood pressure, reduce any swelling, and protect your heart, liver, and kidneys. We get sodium by adding salt (sodium chloride) to food. Most of our sodium comes from canned, boxed, and frozen foods. Restaurant foods, fast foods, and pizza are also very high in sodium. Even if you take medicine to lower your blood pressure or to reduce fluid in your body, getting less sodium from your food is important. WHAT IS MY PLAN? Most people should limit their sodium intake to 2,300 mg a day. Your health care provider recommends that you limit your sodium intake to __________ a day.  WHAT DO I NEED TO KNOW ABOUT THIS EATING PLAN? For the low-sodium eating plan, you will follow these general guidelines:  Choose foods with a % Daily Value for sodium of less than 5% (as listed on the food label).   Use salt-free seasonings or herbs instead of table salt or sea salt.   Check with your health care provider or pharmacist before using salt substitutes.   Eat fresh foods.  Eat more vegetables and fruits.  Limit canned vegetables. If you do use them, rinse them well to decrease the sodium.   Limit cheese to 1 oz (28 g) per day.   Eat lower-sodium products, often labeled as "lower sodium" or "no salt added."  Avoid foods that contain monosodium glutamate (MSG). MSG is sometimes added to [place] food and some  canned foods.  Check food labels (Nutrition Facts labels) on foods to learn how much sodium is in one serving.  Eat more home-cooked food and less restaurant, buffet, and fast food.  When eating at a restaurant, ask that your food be prepared with less salt, or no salt if possible.  HOW DO I READ FOOD LABELS FOR SODIUM INFORMATION? The Nutrition Facts label lists the amount of sodium in one serving of the food. If you eat more than one serving, you must multiply the listed amount of sodium by the number of servings. Food labels may also identify foods as:  Sodium free--Less than 5 mg in a serving.  Very low sodium--35 mg or less in a serving.  Low sodium--140 mg or less in a serving.  Light in sodium--50% less sodium in a serving. For example, if a food that usually has 300 mg of sodium is changed to become light in sodium, it will have 150 mg of sodium.  Reduced sodium--25% less sodium in a serving. For example, if a food that usually has 400 mg of sodium is changed to reduced sodium, it will have 300 mg of sodium. WHAT FOODS CAN I EAT? Grains Low-sodium cereals, including oats, puffed wheat and rice, and shredded wheat cereals. Low-sodium crackers. Unsalted rice and pasta. Lower-sodium bread.  Vegetables Frozen or fresh vegetables. Low-sodium or reduced-sodium canned vegetables. Low-sodium or reduced-sodium tomato sauce and paste. Low-sodium or reduced-sodium tomato and vegetable juices.  Fruits Fresh,  frozen, and canned fruit. Fruit juice.  Meat and Other Protein Products Low-sodium canned tuna and salmon. Fresh or frozen meat, poultry, seafood, and fish. Lamb. Unsalted nuts. Dried beans, peas, and lentils without added salt. Unsalted canned beans. Homemade soups without salt. Eggs.  Dairy Milk. Soy milk. Ricotta cheese. Low-sodium or reduced-sodium cheeses. Yogurt.  Condiments Fresh and dried herbs and spices. Salt-free seasonings. Onion and garlic powders. Low-sodium  varieties of mustard and ketchup. Fresh or refrigerated horseradish. Lemon juice.  Fats and Oils Reduced-sodium salad dressings. Unsalted butter.  Other Unsalted popcorn and pretzels.  The items listed above may not be a complete list of recommended foods or beverages. Contact your dietitian for more options. WHAT FOODS ARE NOT RECOMMENDED? Grains Instant hot cereals. Bread stuffing, pancake, and biscuit mixes. Croutons. Seasoned rice or pasta mixes. Noodle soup cups. Boxed or frozen macaroni and cheese. Self-rising flour. Regular salted crackers. Vegetables Regular canned vegetables. Regular canned tomato sauce and paste. Regular tomato and vegetable juices. Frozen vegetables in sauces. Salted [place] fries. Olives. Rosita Fire. Relishes. Sauerkraut. Salsa. Meat and Other Protein Products Salted, canned, smoked, spiced, or pickled meats, seafood, or fish. Bacon, ham, sausage, hot dogs, corned beef, chipped beef, and packaged luncheon meats. Salt pork. Jerky. Pickled herring. Anchovies, regular canned tuna, and sardines. Salted nuts. Dairy Processed cheese and cheese spreads. Cheese curds. Blue cheese and cottage cheese. Buttermilk.  Condiments Onion and garlic salt, seasoned salt, table salt, and sea salt. Canned and packaged gravies. Worcestershire sauce. Tartar sauce. Barbecue sauce. Teriyaki sauce. Soy sauce, including reduced sodium. Steak sauce. Fish sauce. Oyster sauce. Cocktail sauce. Horseradish that you find on the shelf. Regular ketchup and mustard. Meat flavorings and tenderizers. Bouillon cubes. Hot sauce. Tabasco sauce. Marinades. Taco seasonings. Relishes. Fats and Oils Regular salad dressings. Salted butter. Margarine. Ghee. Bacon fat.  Other Potato and tortilla chips. Corn chips and puffs. Salted popcorn and pretzels. Canned or dried soups. Pizza. Frozen entrees and pot pies.  The items listed above may not be a complete list of foods and beverages to avoid. Contact your  dietitian for more information.   This information is not intended to replace advice given to you by your health care provider. Make sure you discuss any questions you have with your health care provider.   Document Released: 11/06/2001 Document Revised: 06/07/2014 Document Reviewed: 03/21/2013 Elsevier Interactive Patient Education [employer]

## 2015-11-20 ENCOUNTER — Encounter: Payer: Self-pay | Admitting: Internal Medicine

## 2015-11-24 ENCOUNTER — Encounter: Payer: Self-pay | Admitting: Internal Medicine

## 2015-11-24 ENCOUNTER — Other Ambulatory Visit: Payer: Self-pay | Admitting: Internal Medicine

## 2015-11-24 DIAGNOSIS — E519 Thiamine deficiency, unspecified: Secondary | ICD-10-CM

## 2015-11-24 LAB — VITAMIN B1: VITAMIN B1 (THIAMINE): 7 nmol/L — AB (ref 8–30)

## 2015-11-24 MED ORDER — VITAMIN B-1 100 MG PO TABS: 100.0000 mg | ORAL_TABLET | Freq: Every day | ORAL | Status: DC

## 2015-11-26 ENCOUNTER — Ambulatory Visit
Admission: RE | Admit: 2015-11-26 | Discharge: 2015-11-26 | Disposition: A | Discharge status: Home / Self Care | Payer: BLUE CROSS/BLUE SHIELD | Source: Ambulatory Visit | Attending: Internal Medicine | Admitting: Internal Medicine

## 2015-11-26 DIAGNOSIS — R51 Headache: Principal | ICD-10-CM

## 2015-11-26 DIAGNOSIS — R9089 Other abnormal findings on diagnostic imaging of central nervous system: Secondary | ICD-10-CM

## 2015-11-26 DIAGNOSIS — R519 Headache, unspecified: Secondary | ICD-10-CM

## 2015-11-28 ENCOUNTER — Encounter: Payer: Self-pay | Admitting: Internal Medicine

## 2016-01-06 ENCOUNTER — Other Ambulatory Visit: Payer: BLUE CROSS/BLUE SHIELD | Admitting: *Deleted

## 2016-01-06 ENCOUNTER — Telehealth: Payer: Self-pay | Admitting: *Deleted

## 2016-01-06 DIAGNOSIS — E785 Hyperlipidemia, unspecified: Secondary | ICD-10-CM

## 2016-01-06 DIAGNOSIS — I25119 Atherosclerotic heart disease of native coronary artery with unspecified angina pectoris: Secondary | ICD-10-CM

## 2016-01-06 LAB — HEPATIC FUNCTION PANEL
ALT: 12 U/L (ref 9–46)
AST: 14 U/L (ref 10–35)
Albumin: 4.2 g/dL (ref 3.6–5.1)
Alkaline Phosphatase: 65 U/L (ref 40–115)
BILIRUBIN DIRECT: 0.2 mg/dL (ref <=0.2)
BILIRUBIN TOTAL: 0.7 mg/dL (ref 0.2–1.2)
Indirect Bilirubin: 0.5 mg/dL (ref 0.2–1.2)
Total Protein: 6.5 g/dL (ref 6.1–8.1)

## 2016-01-06 LAB — LIPID PANEL
CHOL/HDL RATIO: 3 ratio (ref <=5.0)
CHOLESTEROL: 136 mg/dL (ref 125–200)
HDL: 45 mg/dL (ref >=40)
LDL CALC: 81 mg/dL (ref <130)
Triglycerides: 52 mg/dL (ref <150)
VLDL: 10 mg/dL (ref <30)

## 2016-01-06 NOTE — Telephone Encounter (Signed)
Pt notified of lab results by phone with verbal understanding. Repeat FLP/LFT 6 months; 07/08/16.

## 2016-02-06 ENCOUNTER — Telehealth: Payer: Self-pay | Admitting: Interventional Cardiology

## 2016-02-06 NOTE — Telephone Encounter (Signed)
Nitro not on current med list. Ok to send him in an rx? Please advise. Thanks, MI

## 2016-02-06 NOTE — Telephone Encounter (Signed)
New message     *STAT* If patient is at the pharmacy, call can be transferred to refill team.   1. Which medications need to be refilled? (please list name of each medication and dose if known) nitrogycerin   2. Which pharmacy/location (including street and city if local pharmacy) is medication to be sent to? [facility] on [address]   3. Do they need a 30 day or 90 day supply? 30

## 2016-02-11 NOTE — Telephone Encounter (Signed)
Follow up        *STAT* If patient is at the pharmacy, call can be transferred to refill team.   1. Which medications need to be refilled? (please list name of each medication and dose if known) nitro pills 2. Which pharmacy/location (including street and city if local pharmacy) is medication to be sent to?[facility] at [address] 3. Do they need a 30 day or 90 day supply? 30 day Pt got first presc while in the hosp.

## 2016-02-12 MED ORDER — NITROGLYCERIN 0.4 MG SL SUBL: 0.4000 mg | SUBLINGUAL_TABLET | SUBLINGUAL | 1 refills | Status: DC | PRN

## 2016-02-12 NOTE — Telephone Encounter (Signed)
Ok to refill. Pt has had an Rx for Nitro that has expired

## 2016-02-12 NOTE — Addendum Note (Signed)
Addended by: Awilda Bill on: 02/12/2016 08:51 AM   Modules accepted: Orders

## 2016-02-24 ENCOUNTER — Encounter (HOSPITAL_COMMUNITY): Payer: Self-pay | Admitting: Emergency Medicine

## 2016-02-24 ENCOUNTER — Inpatient Hospital Stay (HOSPITAL_COMMUNITY)
Admission: EM | Admit: 2016-02-24 | Discharge: 2016-02-25 | DRG: 247 | Disposition: A | Discharge status: Home / Self Care | Payer: BLUE CROSS/BLUE SHIELD | Attending: Cardiology | Admitting: Cardiology

## 2016-02-24 ENCOUNTER — Emergency Department (HOSPITAL_COMMUNITY): Payer: BLUE CROSS/BLUE SHIELD

## 2016-02-24 ENCOUNTER — Encounter (HOSPITAL_COMMUNITY)
Admission: EM | Disposition: A | Discharge status: Home / Self Care | Payer: Self-pay | Source: Home / Self Care | Attending: Cardiology

## 2016-02-24 DIAGNOSIS — I200 Unstable angina: Secondary | ICD-10-CM | POA: Diagnosis not present

## 2016-02-24 DIAGNOSIS — I25119 Atherosclerotic heart disease of native coronary artery with unspecified angina pectoris: Secondary | ICD-10-CM | POA: Diagnosis present

## 2016-02-24 DIAGNOSIS — R001 Bradycardia, unspecified: Secondary | ICD-10-CM | POA: Diagnosis present

## 2016-02-24 DIAGNOSIS — I4430 Unspecified atrioventricular block: Secondary | ICD-10-CM | POA: Diagnosis not present

## 2016-02-24 DIAGNOSIS — Z8673 Personal history of transient ischemic attack (TIA), and cerebral infarction without residual deficits: Secondary | ICD-10-CM | POA: Diagnosis not present

## 2016-02-24 DIAGNOSIS — Z79899 Other long term (current) drug therapy: Secondary | ICD-10-CM | POA: Diagnosis not present

## 2016-02-24 DIAGNOSIS — F329 Major depressive disorder, single episode, unspecified: Secondary | ICD-10-CM | POA: Diagnosis present

## 2016-02-24 DIAGNOSIS — I214 Non-ST elevation (NSTEMI) myocardial infarction: Principal | ICD-10-CM | POA: Diagnosis present

## 2016-02-24 DIAGNOSIS — H918X1 Other specified hearing loss, right ear: Secondary | ICD-10-CM | POA: Diagnosis present

## 2016-02-24 DIAGNOSIS — M549 Dorsalgia, unspecified: Secondary | ICD-10-CM | POA: Diagnosis present

## 2016-02-24 DIAGNOSIS — K219 Gastro-esophageal reflux disease without esophagitis: Secondary | ICD-10-CM | POA: Diagnosis present

## 2016-02-24 DIAGNOSIS — I25110 Atherosclerotic heart disease of native coronary artery with unstable angina pectoris: Secondary | ICD-10-CM | POA: Diagnosis present

## 2016-02-24 DIAGNOSIS — E785 Hyperlipidemia, unspecified: Secondary | ICD-10-CM | POA: Diagnosis present

## 2016-02-24 DIAGNOSIS — I2510 Atherosclerotic heart disease of native coronary artery without angina pectoris: Secondary | ICD-10-CM

## 2016-02-24 DIAGNOSIS — Z888 Allergy status to other drugs, medicaments and biological substances status: Secondary | ICD-10-CM

## 2016-02-24 DIAGNOSIS — Z9861 Coronary angioplasty status: Secondary | ICD-10-CM

## 2016-02-24 DIAGNOSIS — I1310 Hypertensive heart and chronic kidney disease without heart failure, with stage 1 through stage 4 chronic kidney disease, or unspecified chronic kidney disease: Secondary | ICD-10-CM | POA: Diagnosis present

## 2016-02-24 DIAGNOSIS — N182 Chronic kidney disease, stage 2 (mild): Secondary | ICD-10-CM | POA: Diagnosis present

## 2016-02-24 DIAGNOSIS — G8929 Other chronic pain: Secondary | ICD-10-CM | POA: Diagnosis present

## 2016-02-24 DIAGNOSIS — F17210 Nicotine dependence, cigarettes, uncomplicated: Secondary | ICD-10-CM | POA: Diagnosis present

## 2016-02-24 DIAGNOSIS — Z7982 Long term (current) use of aspirin: Secondary | ICD-10-CM

## 2016-02-24 DIAGNOSIS — N189 Chronic kidney disease, unspecified: Secondary | ICD-10-CM | POA: Diagnosis not present

## 2016-02-24 DIAGNOSIS — I440 Atrioventricular block, first degree: Secondary | ICD-10-CM | POA: Diagnosis present

## 2016-02-24 DIAGNOSIS — Z8249 Family history of ischemic heart disease and other diseases of the circulatory system: Secondary | ICD-10-CM

## 2016-02-24 DIAGNOSIS — Z955 Presence of coronary angioplasty implant and graft: Secondary | ICD-10-CM

## 2016-02-24 DIAGNOSIS — I10 Essential (primary) hypertension: Secondary | ICD-10-CM | POA: Diagnosis present

## 2016-02-24 HISTORY — PX: CARDIAC CATHETERIZATION: SHX172

## 2016-02-24 LAB — APTT: APTT: 33 s (ref 24–36)

## 2016-02-24 LAB — BASIC METABOLIC PANEL
Anion gap: 6 (ref 5–15)
Anion gap: 8 (ref 5–15)
Anion gap: 5 (ref 5–15)
BUN: 15 mg/dL (ref 6–20)
BUN: 15 mg/dL (ref 6–20)
BUN: 13 mg/dL (ref 6–20)
CALCIUM: 9.3 mg/dL (ref 8.9–10.3)
CHLORIDE: 106 mmol/L (ref 101–111)
CHLORIDE: 109 mmol/L (ref 101–111)
CO2: 28 mmol/L (ref 22–32)
CO2: 26 mmol/L (ref 22–32)
CO2: 25 mmol/L (ref 22–32)
CREATININE: 1.5 mg/dL — AB (ref 0.61–1.24)
CREATININE: 1.4 mg/dL — AB (ref 0.61–1.24)
CREATININE: 1.24 mg/dL (ref 0.61–1.24)
Calcium: 9.1 mg/dL (ref 8.9–10.3)
Calcium: 9.1 mg/dL (ref 8.9–10.3)
Chloride: 105 mmol/L (ref 101–111)
GFR calc Af Amer: 59 mL/min — ABNORMAL LOW (ref >60)
GFR calc Af Amer: >60 mL/min (ref >60)
GFR calc Af Amer: >60 mL/min (ref >60)
GFR calc non Af Amer: 51 mL/min — ABNORMAL LOW (ref >60)
GFR calc non Af Amer: 56 mL/min — ABNORMAL LOW (ref >60)
GFR calc non Af Amer: >60 mL/min (ref >60)
GLUCOSE: 105 mg/dL — AB (ref 65–99)
GLUCOSE: 92 mg/dL (ref 65–99)
GLUCOSE: 78 mg/dL (ref 65–99)
POTASSIUM: 3.7 mmol/L (ref 3.5–5.1)
POTASSIUM: 3.8 mmol/L (ref 3.5–5.1)
Potassium: 4 mmol/L (ref 3.5–5.1)
SODIUM: 140 mmol/L (ref 135–145)
SODIUM: 139 mmol/L (ref 135–145)
Sodium: 139 mmol/L (ref 135–145)

## 2016-02-24 LAB — CBC
HCT: 35.6 % — ABNORMAL LOW (ref 39.0–52.0)
Hemoglobin: 11.4 g/dL — ABNORMAL LOW (ref 13.0–17.0)
MCH: 29.1 pg (ref 26.0–34.0)
MCHC: 32 g/dL (ref 30.0–36.0)
MCV: 90.8 fL (ref 78.0–100.0)
PLATELETS: 242 10*3/uL (ref 150–400)
RBC: 3.92 MIL/uL — ABNORMAL LOW (ref 4.22–5.81)
RDW: 12.4 % (ref 11.5–15.5)
WBC: 5.8 10*3/uL (ref 4.0–10.5)

## 2016-02-24 LAB — MRSA PCR SCREENING: MRSA BY PCR: NEGATIVE

## 2016-02-24 LAB — TROPONIN I
TROPONIN I: 0.11 ng/mL — AB (ref <0.03)
TROPONIN I: 0.09 ng/mL — AB (ref <0.03)
Troponin I: 0.15 ng/mL (ref <0.03)
Troponin I: 0.09 ng/mL (ref <0.03)

## 2016-02-24 LAB — I-STAT TROPONIN, ED: TROPONIN I, POC: 0.15 ng/mL — AB (ref 0.00–0.08)

## 2016-02-24 LAB — PROTIME-INR
INR: 1.06
Prothrombin Time: 13.8 seconds (ref 11.4–15.2)

## 2016-02-24 LAB — POCT ACTIVATED CLOTTING TIME
Activated Clotting Time: 224 seconds
Activated Clotting Time: 246 seconds

## 2016-02-24 LAB — HEPARIN LEVEL (UNFRACTIONATED): HEPARIN UNFRACTIONATED: 0.27 [IU]/mL — AB (ref 0.30–0.70)

## 2016-02-24 LAB — TSH: TSH: 2.409 u[IU]/mL (ref 0.350–4.500)

## 2016-02-24 SURGERY — LEFT HEART CATH AND CORONARY ANGIOGRAPHY

## 2016-02-24 MED ORDER — SODIUM CHLORIDE 0.9 % IV SOLN: 250.0000 mL | INTRAVENOUS | Status: DC | PRN

## 2016-02-24 MED ORDER — NITROGLYCERIN 1 MG/10 ML FOR IR/CATH LAB
INTRA_ARTERIAL | Status: AC
  Filled 2016-02-24: qty 10

## 2016-02-24 MED ORDER — IOPAMIDOL (ISOVUE-370) INJECTION 76%
INTRAVENOUS | Status: DC | PRN
  Administered 2016-02-24: 140 mL via INTRA_ARTERIAL

## 2016-02-24 MED ORDER — VERAPAMIL HCL 2.5 MG/ML IV SOLN
INTRAVENOUS | Status: AC
  Filled 2016-02-24: qty 2

## 2016-02-24 MED ORDER — ENSURE ENLIVE PO LIQD
237.0000 mL | Freq: Two times a day (BID) | ORAL | Status: DC
  Administered 2016-02-25: 10:00:00 237 mL via ORAL
  Filled 2016-02-24 (×4): qty 237

## 2016-02-24 MED ORDER — ASPIRIN 81 MG PO CHEW
324.0000 mg | CHEWABLE_TABLET | Freq: Once | ORAL | Status: AC
  Administered 2016-02-24: 324 mg via ORAL
  Filled 2016-02-24: qty 4

## 2016-02-24 MED ORDER — SODIUM CHLORIDE 0.9% FLUSH: 3.0000 mL | Freq: Two times a day (BID) | INTRAVENOUS | Status: DC

## 2016-02-24 MED ORDER — MIDAZOLAM HCL 2 MG/2ML IJ SOLN
INTRAMUSCULAR | Status: AC
  Filled 2016-02-24: qty 2

## 2016-02-24 MED ORDER — HEPARIN (PORCINE) IN NACL 100-0.45 UNIT/ML-% IJ SOLN
1200.0000 [IU]/h | INTRAMUSCULAR | Status: DC
  Administered 2016-02-24: 1200 [IU]/h via INTRAVENOUS
  Filled 2016-02-24: qty 250

## 2016-02-24 MED ORDER — PRASUGREL HCL 10 MG PO TABS
ORAL_TABLET | ORAL | Status: AC
  Filled 2016-02-24: qty 6

## 2016-02-24 MED ORDER — METOPROLOL TARTRATE 12.5 MG HALF TABLET
12.5000 mg | ORAL_TABLET | Freq: Two times a day (BID) | ORAL | Status: DC
  Administered 2016-02-24 – 2016-02-25 (×2): 12.5 mg via ORAL
  Filled 2016-02-24 (×2): qty 1

## 2016-02-24 MED ORDER — NITROGLYCERIN IN D5W 200-5 MCG/ML-% IV SOLN
INTRAVENOUS | Status: AC
  Administered 2016-02-24: 10 ug
  Filled 2016-02-24: qty 250

## 2016-02-24 MED ORDER — SODIUM CHLORIDE 0.9 % WEIGHT BASED INFUSION: 3.0000 mL/kg/h | INTRAVENOUS | Status: DC

## 2016-02-24 MED ORDER — SODIUM CHLORIDE 0.9 % WEIGHT BASED INFUSION: 1.0000 mL/kg/h | INTRAVENOUS | Status: DC

## 2016-02-24 MED ORDER — HYDRALAZINE HCL 20 MG/ML IJ SOLN
INTRAMUSCULAR | Status: AC
  Filled 2016-02-24: qty 1

## 2016-02-24 MED ORDER — SODIUM CHLORIDE 0.9 % WEIGHT BASED INFUSION
3.0000 mL/kg/h | INTRAVENOUS | Status: AC
  Administered 2016-02-24: 3 mL/kg/h via INTRAVENOUS

## 2016-02-24 MED ORDER — CLOPIDOGREL BISULFATE 75 MG PO TABS: 75.0000 mg | ORAL_TABLET | Freq: Every day | ORAL | Status: DC

## 2016-02-24 MED ORDER — VERAPAMIL HCL 2.5 MG/ML IV SOLN
INTRAVENOUS | Status: DC | PRN
  Administered 2016-02-24: 10 mL via INTRA_ARTERIAL

## 2016-02-24 MED ORDER — HYDRALAZINE HCL 20 MG/ML IJ SOLN
10.0000 mg | INTRAMUSCULAR | Status: DC | PRN
  Administered 2016-02-24: 10 mg via INTRAVENOUS
  Filled 2016-02-24: qty 1

## 2016-02-24 MED ORDER — FENTANYL CITRATE (PF) 100 MCG/2ML IJ SOLN
INTRAMUSCULAR | Status: AC
  Filled 2016-02-24: qty 2

## 2016-02-24 MED ORDER — ONDANSETRON HCL 4 MG/2ML IJ SOLN: 4.0000 mg | Freq: Four times a day (QID) | INTRAMUSCULAR | Status: DC | PRN

## 2016-02-24 MED ORDER — IOPAMIDOL (ISOVUE-370) INJECTION 76%
INTRAVENOUS | Status: AC
  Filled 2016-02-24: qty 100

## 2016-02-24 MED ORDER — CLOPIDOGREL BISULFATE 75 MG PO TABS
75.0000 mg | ORAL_TABLET | Freq: Every day | ORAL | Status: DC
  Administered 2016-02-25: 75 mg via ORAL
  Filled 2016-02-24: qty 1

## 2016-02-24 MED ORDER — HEPARIN (PORCINE) IN NACL 2-0.9 UNIT/ML-% IJ SOLN
INTRAMUSCULAR | Status: AC
  Filled 2016-02-24: qty 2000

## 2016-02-24 MED ORDER — PRASUGREL HCL 10 MG PO TABS
ORAL_TABLET | ORAL | Status: DC | PRN
  Administered 2016-02-24: 60 mg via ORAL

## 2016-02-24 MED ORDER — SODIUM CHLORIDE 0.9% FLUSH: 3.0000 mL | INTRAVENOUS | Status: DC | PRN

## 2016-02-24 MED ORDER — HYDRALAZINE HCL 20 MG/ML IJ SOLN
INTRAMUSCULAR | Status: DC | PRN
  Administered 2016-02-24: 20 mg via INTRAVENOUS

## 2016-02-24 MED ORDER — ACETAMINOPHEN 325 MG PO TABS
650.0000 mg | ORAL_TABLET | ORAL | Status: DC | PRN
  Administered 2016-02-24: 650 mg via ORAL
  Filled 2016-02-24: qty 2

## 2016-02-24 MED ORDER — VITAMIN B-1 100 MG PO TABS
100.0000 mg | ORAL_TABLET | Freq: Every day | ORAL | Status: DC
  Administered 2016-02-24 – 2016-02-25 (×2): 100 mg via ORAL
  Filled 2016-02-24 (×2): qty 1

## 2016-02-24 MED ORDER — LABETALOL HCL 100 MG PO TABS: 300.0000 mg | ORAL_TABLET | Freq: Two times a day (BID) | ORAL | Status: DC

## 2016-02-24 MED ORDER — ASPIRIN 81 MG PO CHEW: 324.0000 mg | CHEWABLE_TABLET | ORAL | Status: AC

## 2016-02-24 MED ORDER — TRAMADOL HCL 50 MG PO TABS
50.0000 mg | ORAL_TABLET | Freq: Once | ORAL | Status: AC
  Administered 2016-02-24: 50 mg via ORAL
  Filled 2016-02-24: qty 1

## 2016-02-24 MED ORDER — HYDRALAZINE HCL 50 MG PO TABS
50.0000 mg | ORAL_TABLET | Freq: Three times a day (TID) | ORAL | Status: DC
  Administered 2016-02-24 – 2016-02-25 (×3): 50 mg via ORAL
  Filled 2016-02-24 (×3): qty 1

## 2016-02-24 MED ORDER — FENTANYL CITRATE (PF) 100 MCG/2ML IJ SOLN
INTRAMUSCULAR | Status: DC | PRN
  Administered 2016-02-24 (×2): 25 ug via INTRAVENOUS

## 2016-02-24 MED ORDER — SODIUM CHLORIDE 0.9 % IV SOLN: INTRAVENOUS | Status: AC

## 2016-02-24 MED ORDER — HEPARIN SODIUM (PORCINE) 1000 UNIT/ML IJ SOLN
INTRAMUSCULAR | Status: AC
  Filled 2016-02-24: qty 1

## 2016-02-24 MED ORDER — CLOPIDOGREL BISULFATE 75 MG PO TABS: 300.0000 mg | ORAL_TABLET | Freq: Once | ORAL | Status: DC

## 2016-02-24 MED ORDER — HEPARIN SODIUM (PORCINE) 1000 UNIT/ML IJ SOLN
INTRAMUSCULAR | Status: DC | PRN
  Administered 2016-02-24 (×2): 4000 [IU] via INTRAVENOUS
  Administered 2016-02-24: 5000 [IU] via INTRAVENOUS

## 2016-02-24 MED ORDER — LIDOCAINE HCL (PF) 1 % IJ SOLN
INTRAMUSCULAR | Status: DC | PRN
  Administered 2016-02-24: 2 mL

## 2016-02-24 MED ORDER — NITROGLYCERIN 0.4 MG SL SUBL: 0.4000 mg | SUBLINGUAL_TABLET | SUBLINGUAL | Status: DC | PRN

## 2016-02-24 MED ORDER — MIDAZOLAM HCL 2 MG/2ML IJ SOLN
INTRAMUSCULAR | Status: DC | PRN
  Administered 2016-02-24 (×2): 1 mg via INTRAVENOUS

## 2016-02-24 MED ORDER — HEPARIN (PORCINE) IN NACL 2-0.9 UNIT/ML-% IJ SOLN
INTRAMUSCULAR | Status: DC | PRN
  Administered 2016-02-24: 2000 mL

## 2016-02-24 MED ORDER — NITROGLYCERIN 1 MG/10 ML FOR IR/CATH LAB
INTRA_ARTERIAL | Status: DC | PRN
  Administered 2016-02-24 (×2): 200 ug via INTRACORONARY

## 2016-02-24 MED ORDER — ROSUVASTATIN CALCIUM 20 MG PO TABS: 40.0000 mg | ORAL_TABLET | Freq: Every day | ORAL | Status: DC

## 2016-02-24 MED ORDER — ASPIRIN 81 MG PO CHEW: 81.0000 mg | CHEWABLE_TABLET | ORAL | Status: DC

## 2016-02-24 MED ORDER — LIDOCAINE HCL (PF) 1 % IJ SOLN
INTRAMUSCULAR | Status: AC
  Filled 2016-02-24: qty 30

## 2016-02-24 MED ORDER — NITROGLYCERIN IN D5W 200-5 MCG/ML-% IV SOLN
2.0000 ug/min | INTRAVENOUS | Status: DC
  Administered 2016-02-24: 20 ug/min via INTRAVENOUS
  Administered 2016-02-24 (×2): 10 ug/min via INTRAVENOUS

## 2016-02-24 MED ORDER — PRASUGREL HCL 10 MG PO TABS
ORAL_TABLET | ORAL | Status: AC
  Filled 2016-02-24: qty 1

## 2016-02-24 MED ORDER — NITROGLYCERIN 0.4 MG SL SUBL
0.4000 mg | SUBLINGUAL_TABLET | SUBLINGUAL | Status: DC | PRN
  Administered 2016-02-24: 0.4 mg via SUBLINGUAL
  Filled 2016-02-24: qty 1

## 2016-02-24 MED ORDER — HEPARIN BOLUS VIA INFUSION
4000.0000 [IU] | Freq: Once | INTRAVENOUS | Status: AC
  Administered 2016-02-24: 4000 [IU] via INTRAVENOUS
  Filled 2016-02-24: qty 4000

## 2016-02-24 MED ORDER — ASPIRIN EC 81 MG PO TBEC
81.0000 mg | DELAYED_RELEASE_TABLET | Freq: Every day | ORAL | Status: DC
  Administered 2016-02-25: 81 mg via ORAL
  Filled 2016-02-24: qty 1

## 2016-02-24 MED ORDER — ASPIRIN 300 MG RE SUPP: 300.0000 mg | RECTAL | Status: AC

## 2016-02-24 MED ORDER — AMLODIPINE BESYLATE 10 MG PO TABS
10.0000 mg | ORAL_TABLET | Freq: Every day | ORAL | Status: DC
  Administered 2016-02-24 – 2016-02-25 (×2): 10 mg via ORAL
  Filled 2016-02-24 (×2): qty 1

## 2016-02-24 SURGICAL SUPPLY — 16 items
BALLN EUPHORA RX 2.5X15 (BALLOONS) ×3
BALLN ~~LOC~~ EUPHORA RX 3.0X20 (BALLOONS) ×3
BALLOON EUPHORA RX 2.5X15 (BALLOONS) ×1 IMPLANT
BALLOON ~~LOC~~ EUPHORA RX 3.0X20 (BALLOONS) ×1 IMPLANT
CATH IMPULSE 5F ANG/FL3.5 (CATHETERS) ×3 IMPLANT
DEVICE RAD COMP TR BAND LRG (VASCULAR PRODUCTS) ×3 IMPLANT
GLIDESHEATH SLEND SS 6F .021 (SHEATH) ×3 IMPLANT
GUIDE CATH RUNWAY 6FR CLS3.5 (CATHETERS) ×3 IMPLANT
KIT ENCORE 26 ADVANTAGE (KITS) ×3 IMPLANT
KIT HEART LEFT (KITS) ×3 IMPLANT
PACK CARDIAC CATHETERIZATION (CUSTOM PROCEDURE TRAY) ×3 IMPLANT
STENT PROMUS PREM MR 2.5X38 (Permanent Stent) ×3 IMPLANT
TRANSDUCER W/STOPCOCK (MISCELLANEOUS) ×3 IMPLANT
TUBING CIL FLEX 10 FLL-RA (TUBING) ×3 IMPLANT
WIRE COUGAR XT STRL 190CM (WIRE) ×3 IMPLANT
WIRE SAFE-T 1.5MM-J .035X260CM (WIRE) ×3 IMPLANT

## 2016-02-24 NOTE — ED Provider Notes (Signed)
[facility]-EMERGENCY DEPT Provider Note   CSN: [id number] Arrival date & time: 02/24/16  3374  By signing my name below, I, Suzan Slick. Elon Spanner, attest that this documentation has been prepared under the direction and in the presence of Zadie Rhine, MD.  Electronically Signed: Suzan Slick. Elon Spanner, ED Scribe. 02/24/16. 2:48 AM.    History   Chief Complaint Chief Complaint  Patient presents with  . Chest Pain   The history is provided by the patient. No language interpreter was used.  Chest Pain   This is a recurrent problem. The current episode started more than 1 week ago. The problem occurs every several days. The problem has not changed since onset.The pain is moderate. The quality of the pain is described as pressure-like. The pain radiates to the left arm. Duration of episode(s) is 4 hours. Associated symptoms include shortness of breath. Pertinent negatives include no fever. He has tried nothing for the symptoms.  His past medical history is significant for CAD, hyperlipidemia, hypertension and strokes.  Procedure history is positive for cardiac catheterization (11/22/2013) and echocardiogram.    HPI Comments: [name] is a 55 y.o. male with a PMHx of cardiomyopathy, MI, CAD, HTN, hyperlipidemia, and stroke who presents to the Emergency Department complaining of constant, unchanged central chest pain that radiates to L chest and L arm x 3 weeks. Most current episode starting 4 hours prior to evaluation. Currently pain is described as pressure. No aggravating or alleviating factors at this time. He also reports mild shortness of breath. No OTC/prescribed medications or home remedies attempted prior to arrival. No recent fever, chills, nausea, vomiting, or diaphoresis.  PCP: Sanda Linger, MD   CARDIOLOGIST: Verdis Prime, MD  Past Medical History:  Diagnosis Date  . Apical variant hypertrophic cardiomyopathy (HCC)   . CAD (coronary artery disease)    a. LHC in 2011 with severe dz in  D1/D2 and very distal LAD--> too small for PCI and managed medically. b.  LHC in 10/2013 after an abnormal nuclear study w/ non-obst dz/  . Chronic back pain   . 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  Marland Kitchen HOH (hard of hearing)    rgiht ear  . Hyperlipidemia   . Hypertension   . Migraine headache   . Stroke [facility])     Patient Active Problem List   Diagnosis Date Noted  . Thiamine deficiency 11/19/2015  . Cephalalgia 11/19/2015  . Abnormal brain MRI 11/19/2015  . 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 Chronic renal insufficiency, stage II (mild) 07/02/2014  . CAD (coronary artery disease)   . 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/2013  . Drug noncompliance 03/26/2013  . Migraine, unspecified, without mention of intractable migraine without mention of status migrainosus 03/26/2013  . GERD (gastroesophageal reflux disease) 11/14/2012  . Routine general medical examination at a health care facility 07/28/2012  . ERECTILE DYSFUNCTION 08/12/2010  . HLD (hyperlipidemia) 10/15/2009  . TOBACCO USE 10/15/2009  . HTN (hypertension) 10/15/2009    Past Surgical History:  Procedure Laterality Date  . INGUINAL HERNIA REPAIR    . LEFT HEART CATHETERIZATION WITH CORONARY ANGIOGRAM N/A 11/22/2013   Procedure: LEFT HEART CATHETERIZATION WITH CORONARY ANGIOGRAM;  Surgeon: [name], MD;  Location: [facility]  CATH LAB;  Service: Cardiovascular;  Laterality: N/A;  . TEE WITHOUT CARDIOVERSION N/A 08/22/2015   Procedure: TRANSESOPHAGEAL ECHOCARDIOGRAM (TEE);  Surgeon: Vesta Mixer, MD;  Location: [facility] ENDOSCOPY;  Service: Cardiovascular;  Laterality: N/A;       Home Medications    Prior to Admission medications     Medication Sig Start Date End Date Taking? Authorizing Provider  amLODipine (NORVASC) 10 MG tablet TAKE 1 TABLET BY MOUTH EVERY DAY 08/08/15   Lyn Records, MD  aspirin 81 MG tablet Take 81 mg by mouth daily.    Historical Provider, MD  hydrALAZINE (APRESOLINE) 50 MG tablet Take 1 tablet (50 mg total) by mouth 3 (three) times daily. 08/21/15   Beatrice Lecher, PA-C  labetalol (NORMODYNE) 300 MG tablet TAKE 1 TABLET BY MOUTH TWICE A DAY 08/12/15   Lyn Records, MD  nitroGLYCERIN (NITROSTAT) 0.4 MG SL tablet Place 1 tablet (0.4 mg total) under the tongue every 5 (five) minutes as needed for chest pain. 02/12/16 05/12/16  Lyn Records, MD  olmesartan (BENICAR) 40 MG tablet Take 1 tablet (40 mg total) by mouth daily. 07/24/15   Lyn Records, MD  rosuvastatin (CRESTOR) 40 MG tablet Take 1 tablet (40 mg total) by mouth daily. 10/06/15   Beatrice Lecher, PA-C  thiamine (VITAMIN B-1) 100 MG tablet Take 1 tablet (100 mg total) by mouth daily. 11/24/15   Etta Grandchild, MD  vortioxetine HBr (TRINTELLIX) 10 MG TABS Take 1 tablet (10 mg total) by mouth daily. 11/19/15   Etta Grandchild, MD    Family History Family History  Problem Relation Age of Onset  . Hypertension Mother   . Heart failure Mother   . Hypertension Father   . Colon cancer Father 54  . Colon cancer Paternal Uncle     Social History Social History  Substance Use Topics  . Smoking status: Current Some Day Smoker    Packs/day: 0.00    Types: Cigarettes  . Smokeless tobacco: Never Used  . Alcohol use No     Allergies   Lisinopril   Review of Systems Review of Systems  Constitutional: Negative for fever.  Respiratory: Positive for shortness of breath.   Cardiovascular: Positive for chest pain.  Gastrointestinal: Negative for blood in stool.  All other systems reviewed and are negative.    Physical Exam Updated Vital Signs BP 153/93 (BP Location: Right Arm)   Pulse (!) 56   Temp 98.4 F (36.9 C) (Oral)   Resp 12   Ht 5'  8" (1.727 m)   SpO2 100%   Physical Exam  CONSTITUTIONAL: Well developed/well nourished HEAD: Normocephalic/atraumatic EYES: EOMI/PERRL ENMT: Mucous membranes moist NECK: supple no meningeal signs SPINE/BACK:entire spine nontender CV: S1/S2 noted, no murmurs/rubs/gallops noted LUNGS: Lungs are clear to auscultation bilaterally, no apparent distress ABDOMEN: soft, nontender, no rebound or guarding, bowel sounds noted throughout abdomen GU:no cva tenderness NEURO: Pt is awake/alert/appropriate, moves all extremitiesx4.  No facial droop.   EXTREMITIES: pulses normal/equal, full ROM SKIN: warm, color normal PSYCH: no abnormalities of mood noted, alert and oriented to situation   ED Treatments / Results   DIAGNOSTIC STUDIES: Oxygen Saturation is 100% on RA, NORMAL by my interpretation.    COORDINATION OF CARE: 2:39 AM-Discussed treatment plan with pt at bedside and pt agreed to plan.     Labs (all labs ordered are listed, but only abnormal results are displayed) Labs Reviewed  BASIC METABOLIC PANEL - Abnormal; Notable for the  following:       Result Value   Glucose, Bld 105 (*)    Creatinine, Ser 1.50 (*)    GFR calc non Af Amer 51 (*)    GFR calc Af Amer 59 (*)    All other components within normal limits  CBC - Abnormal; Notable for the following:    RBC 3.92 (*)    Hemoglobin 11.4 (*)    HCT 35.6 (*)    All other components within normal limits  TROPONIN I - Abnormal; Notable for the following:    Troponin I 0.15 (*)    All other components within normal limits  I-STAT TROPOININ, ED - Abnormal; Notable for the following:    Troponin i, poc 0.15 (*)    All other components within normal limits  MRSA PCR SCREENING  PROTIME-INR  APTT  HEPARIN LEVEL (UNFRACTIONATED)  TROPONIN I  TROPONIN I  TROPONIN I  TSH    EKG  EKG Interpretation  Date/Time:  Tuesday February 24 2016 02:27:09 EDT Ventricular Rate:  53 PR Interval:  178 QRS Duration: 86 QT  Interval:  384 QTC Calculation: 361 R Axis:   72 Text Interpretation:  Sinus rhythm Left ventricular hypertrophy Anterior infarct, old Abnormal ekg Confirmed by Bebe ShaggyWICKLINE  MD, Ragen Laver ([id number]) on 02/24/2016 2:38:24 AM       Radiology Dg Chest 2 View  Result Date: 02/24/2016 CLINICAL DATA:  55 year old male with chest pain EXAM: CHEST  2 VIEW COMPARISON:  Chest radiograph dated 08/27/2014 FINDINGS: The heart size and mediastinal contours are within normal limits. Both lungs are clear. The visualized skeletal structures are unremarkable. IMPRESSION: No active cardiopulmonary disease. Electronically Signed   By: Elgie CollardArash  Radparvar M.D.   On: 02/24/2016 02:33    Procedures Procedures (including critical care time) CRITICAL CARE Performed by: Joya GaskinsWICKLINE,Marx Doig W Total critical care time: 33 minutes Critical care time was exclusive of separately billable procedures and treating other patients. Critical care was necessary to treat or prevent imminent or life-threatening deterioration. Critical care was time spent personally by me on the following activities: development of treatment plan with patient and/or surrogate as well as nursing, discussions with consultants, evaluation of patient's response to treatment, examination of patient, obtaining history from patient or surrogate, ordering and performing treatments and interventions, ordering and review of laboratory studies, ordering and review of radiographic studies, pulse oximetry and re-evaluation of patient's condition. PATIENT WITH NON-STEMI REQUIRING ASA/NTG AND HEPARIN    Initial Impression / Assessment and Plan / ED Course  I have reviewed the triage vital signs and the nursing notes.  Pertinent labs & imaging results that were available during my care of the patient were reviewed by me and considered in my medical decision making (see chart for details).  Clinical Course    Pt here with concerning h/o ACS, here with elevated troponin =  non-stemi Will need admission ASA ordered D/w cardiology, dr Shirlee Latchmclean He recommends stepdown admission and start heparin infusion D/w patient He is feeling improved in the ED    Final Clinical Impressions(s) / ED Diagnoses   Final diagnoses:  NSTEMI (non-ST elevated myocardial infarction) [facility](HCC)    New Prescriptions New Prescriptions   No medications on file   I personally performed the services described in this documentation, which was scribed in my presence. The recorded information has been reviewed and is accurate.        Zadie Rhineonald Avianah Pellman, MD 02/24/16 [phone]

## 2016-02-24 NOTE — H&P (Signed)
Physician History and Physical    [name] MRN: [id number] DOB/AGE: 10/16/1960 55 y.o. Admit date: 02/24/2016  Primary Care Physician: Dr. Yetta Barre Primary Cardiologist: Dr. Katrinka Blazing  HPI: 55 yo with history of HTN, prior CVA, hypertensive heart disease with LVH, and nonobstructive CAD presented to ER last night with chest pain. He reports episodes of chest pain x 3 weeks.  Episodes have been daily and generally brought on by stress and not exertion.  He took NTG with the first episode with relief, but episodes after that were mild and only lasted about 15 minutes, so he had not taken any further NTG until last night. Last night, he was watching football game around 11 and developed substernal chest aching. This was more severe than prior episodes and persisted.  He came to the ER, TnI was 0.15.  Pain decreased with NTG and is currently at 1/10.  ECG was abnormal with inferior and anterolateral T wave changes but was similar to prior.    PMH: 1. HTN 2. Hypertensive heart disease with moderate LVH.  Apical hypertrophic cardiomyopathy is listed in notes but most recent TEE in 3/17 does not mention.  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  3. Hyperlipidemia 4. CAD: LHC in 2011 with severe disease D1/D2 and distal LAD, not amenable to PCI.  LHC in 6/15 after abnormal nuclear study with nonobstructive disease.  5. H/o CVA 6. H/o migraines  Review of systems complete and found to be negative unless listed above   Family History  Problem Relation Age of Onset  . Hypertension Mother   . Heart failure Mother   . Hypertension Father   . Colon cancer Father 6  . Colon cancer Paternal Uncle     Social History   Social History  . Marital status: Married    Spouse name: N/A  . Number of children: N/A  . Years of education: N/A   Occupational History  .  Not on file.   Social History Main Topics  . Smoking status: Current Some Day Smoker    Packs/day: 0.00    Types: Cigarettes  . Smokeless tobacco: Never Used  . Alcohol use No  . Drug use: No  . Sexual activity: Not on file   Other Topics Concern  . Not on file   Social History Narrative  . No narrative on file     Prescriptions Prior to Admission  Medication Sig Dispense Refill Last Dose  . amLODipine (NORVASC) 10 MG tablet TAKE 1 TABLET BY MOUTH EVERY DAY 30 tablet 11 02/23/2016 at Unknown time  . hydrALAZINE (APRESOLINE) 50 MG tablet Take 1 tablet (50 mg total) by mouth 3 (three) times daily. 90 tablet 3 02/23/2016 at Unknown time  . labetalol (NORMODYNE) 300 MG tablet TAKE 1 TABLET BY MOUTH TWICE A DAY 60 tablet 10 02/23/2016 at Unknown time  . nitroGLYCERIN (NITROSTAT) 0.4 MG SL tablet Place 1 tablet (0.4 mg total) under the tongue every 5 (five) minutes as needed for chest pain. 25 tablet 1 unknown  . olmesartan (BENICAR) 40 MG tablet Take 1 tablet (40 mg total) by mouth daily. 30 tablet 11 02/23/2016 at Unknown time  . rosuvastatin (CRESTOR) 40 MG tablet Take 1 tablet (40 mg total) by mouth daily. 90 tablet 3 02/23/2016 at Unknown time  . thiamine (VITAMIN B-1) 100 MG tablet Take 1 tablet (100 mg  total) by mouth daily. 90 tablet 3 02/23/2016 at Unknown time  . vortioxetine HBr (TRINTELLIX) 10 MG TABS Take 1 tablet (10 mg total) by mouth daily. (Patient not taking: Reported on 02/24/2016) 28 tablet 0 Not Taking at Unknown time    Physical Exam: Blood pressure 154/93, pulse (!) 48, temperature 98.4 F (36.9 C), temperature source Oral, resp. rate 14, height 5\' 8"  (1.727 m), weight 190 lb 12.8 oz (86.5 kg), SpO2 100 %.  General: NAD Neck: No JVD, no thyromegaly or thyroid nodule.  Lungs: Clear to auscultation bilaterally with normal respiratory effort. CV: Nondisplaced PMI.  Heart regular S1/S2, no S3/S4, no murmur.  No peripheral edema.  No carotid bruit.  Normal pedal pulses.    Abdomen: Soft, nontender, no hepatosplenomegaly, no distention.  Skin: Intact without lesions or rashes.  Neurologic: Alert and oriented x 3.  Psych: Normal affect. Extremities: No clubbing or cyanosis.  HEENT: Normal.   Labs:   Lab Results  Component Value Date   WBC 5.8 02/24/2016   HGB 11.4 (L) 02/24/2016   HCT 35.6 (L) 02/24/2016   MCV 90.8 02/24/2016   PLT 242 02/24/2016    Recent Labs Lab 02/24/16 0115  NA 139  K 4.0  CL 105  CO2 28  BUN 15  CREATININE 1.50*  CALCIUM 9.3  GLUCOSE 105*   Lab Results  Component Value Date   CKTOTAL 237 (H) 03/05/2011   CKMB 2.6 03/05/2011   TROPONINI 0.15 (HH) 02/24/2016   Radiology: - CXR: No acute changes.  EKG: NSR, inferior biphasic Ts, anterolateral TWIs.  Similar to prior.   ASSESSMENT AND PLAN: 55 yo with history of HTN, prior CVA, hypertensive heart disease with LVH, and nonobstructive CAD presented to ER last night with chest pain. 1. CAD:  Known nonobstructive CAD.  Admitted last night with NSTEMI, TnI 0.15 and chest pain.  ECG abnormal but not changed from prior.  He has had episodes of chest pain that were somewhat atypical for 3 wks, prolonged chest pain last night.   - Continue heparin gtt.  - Given mild residual CP and HTN, will add NTG gtt.  - ASA, statin, continue home labetalol. - Will plan coronary angiography today.  Discussed risks/benefits with patient and he agrees to proceed.  2. LVH: ?Hypertensive heart disease versus apical variant HCM.  Some notes describe apical HCM, but recent TEE mentions only concentric LVH.  His ECG, which is not significantly changed compared to prior, could be suggestive of apical HCM.  3. HTN: Historically poorly controlled.  BP remains high today.   - Continue home amlodipine, hydralazine, and labetalol.  Can titrate up hydralazine as needed and can use NTG gtt for BP control.  - Hold olmesartan for now as creatinine is mildly elevated.  4. AKI: Mild, creatinine 1.5 up from  1.25 baseline.  Gentle hydration prior to cath and hold olmesartan for now.   Signed: Marca AnconaDalton Khyre Germond 02/24/2016, 6:50 AM

## 2016-02-24 NOTE — Progress Notes (Signed)
ELECTROPHYSIOLOGY CONSULT NOTE    Patient ID: Lott Seelbach MRN: [id number], DOB/AGE: May 20, 1961 55 y.o.  Admit date: 02/24/2016 Date of Consult: 02/24/2016  Primary Physician: Sanda Linger, MD Primary Cardiologist: Dr. Katrinka Blazing Requesting MD: Dr. Herbie Baltimore  Reason for Consultation: CHB  HPI: Ezreal Turay is a 55 y.o. male with PMHx of HTN, CVA, LVH/hypertensive heart disease/and prior imaging demonstrated Lambls excresense, was admitted to [facility] today with c/o CP, NSTEMI and is planned for cath/poss PCI today.  EP is being asked to evaluate episode of bradycardia overnight. He reported episodes of chest pain x 3 weeks.  Episodes have been daily and generally brought on by stress and not exertion.  He took NTG with the first episode with relief, but episodes after that were mild and only lasted about 15 minutes, so he had not taken any further NTG until last night. Last night, he was watching football game around 11 and developed substernal chest aching. This was more severe than prior episodes and persisted and came in.  He continues with a vague/mild sense of chest discomfort is peniding cath today, denies any kind of dizziness, lightheaded or syncope/near syncope now or ever.   Records note previous caths: CAD: LHC in 2011 with severe disease D1/D2 and distal LAD, not amenable to PCI.  LHC in 6/15 after abnormal nuclear study with nonobstructive disease.   LABS: BUN/Creat 15/1.50 K+ 4.0 Trop I: 0.15 >> 0.11 H/H 11/35 WBC 5.8 plts 242  HOME meds include labetalol 300mg  BID, none has been given in-patient, patient took his home dose as usual yesterday including last night prior to coming to the hospital Norvasc 10mg  (not taken yesterday)  Past Medical History:  Diagnosis Date  . Apical variant hypertrophic cardiomyopathy (HCC)   . CAD (coronary artery disease)    a. LHC in 2011 with severe dz in D1/D2 and very distal LAD--> too small for PCI and managed medically. b.  LHC in  10/2013 after an abnormal nuclear study w/ non-obst dz/  . Chronic back pain   . 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  Marland Kitchen HOH (hard of hearing)    rgiht ear  . Hyperlipidemia   . Hypertension   . Migraine headache   . Stroke [facility])      Surgical History:  Past Surgical History:  Procedure Laterality Date  . INGUINAL HERNIA REPAIR    . LEFT HEART CATHETERIZATION WITH CORONARY ANGIOGRAM N/A 11/22/2013   Procedure: LEFT HEART CATHETERIZATION WITH CORONARY ANGIOGRAM;  Surgeon: [name], MD;  Location: [facility] CATH LAB;  Service: Cardiovascular;  Laterality: N/A;  . TEE WITHOUT CARDIOVERSION N/A 08/22/2015   Procedure: TRANSESOPHAGEAL ECHOCARDIOGRAM (TEE);  Surgeon: Vesta Mixer, MD;  Location: [facility] ENDOSCOPY;  Service: Cardiovascular;  Laterality: N/A;     Prescriptions Prior to Admission  Medication Sig Dispense Refill Last Dose  . amLODipine (NORVASC) 10 MG tablet TAKE 1 TABLET BY MOUTH EVERY DAY 30 tablet 11 02/23/2016 at Unknown time  . hydrALAZINE (APRESOLINE) 50 MG tablet Take 1 tablet (50 mg total) by mouth 3 (three) times daily. 90 tablet 3 02/23/2016 at Unknown time  . labetalol (NORMODYNE) 300 MG tablet TAKE 1 TABLET BY MOUTH TWICE A DAY 60 tablet 10 02/23/2016  at Unknown time  . nitroGLYCERIN (NITROSTAT) 0.4 MG SL tablet Place 1 tablet (0.4 mg total) under the tongue every 5 (five) minutes as needed for chest pain. 25 tablet 1 unknown  . olmesartan (BENICAR) 40 MG tablet Take 1 tablet (40 mg total) by mouth daily. 30 tablet 11 02/23/2016 at Unknown time  . rosuvastatin (CRESTOR) 40 MG tablet Take 1 tablet (40 mg total) by mouth daily. 90 tablet 3 02/23/2016 at Unknown time  . thiamine (VITAMIN B-1) 100 MG tablet Take 1 tablet  (100 mg total) by mouth daily. 90 tablet 3 02/23/2016 at Unknown time  . vortioxetine HBr (TRINTELLIX) 10 MG TABS Take 1 tablet (10 mg total) by mouth daily. (Patient not taking: Reported on 02/24/2016) 28 tablet 0 Not Taking at Unknown time    Inpatient Medications:  . amLODipine  10 mg Oral Daily  . aspirin  324 mg Oral NOW   Or  . aspirin  300 mg Rectal NOW  . [START ON 02/25/2016] aspirin  81 mg Oral Pre-Cath  . [START ON 02/25/2016] aspirin EC  81 mg Oral Daily  . feeding supplement (ENSURE ENLIVE)  237 mL Oral BID BM  . hydrALAZINE  50 mg Oral Q8H  . rosuvastatin  40 mg Oral q1800  . sodium chloride flush  3 mL Intravenous Q12H  . thiamine  100 mg Oral Daily    Allergies:  Allergies  Allergen Reactions  . Lisinopril Swelling    Social History   Social History  . Marital status: Married    Spouse name: N/A  . Number of children: N/A  . Years of education: N/A   Occupational History  . Not on file.   Social History Main Topics  . Smoking status: Current Some Day Smoker    Packs/day: 0.00    Types: Cigarettes  . Smokeless tobacco: Never Used  . Alcohol use No  . Drug use: No  . Sexual activity: Not on file   Other Topics Concern  . Not on file   Social History Narrative  . No narrative on file     Family History  Problem Relation Age of Onset  . Hypertension Mother   . Heart failure Mother   . Hypertension Father   . Colon cancer Father [id number]  . Colon cancer Paternal Uncle      Review of Systems: All other systems reviewed and are otherwise negative except as noted above.  Physical Exam: Vitals:   02/24/16 0445 02/24/16 0500 02/24/16 0544 02/24/16 0751  BP: 144/96 154/93  (!) 159/85  Pulse: (!) 50 (!) 48  (!) 52  Resp: 14 14  18   Temp:    98.1 F (36.7 C)  TempSrc:    Oral  SpO2: 100% 100%  100%  Weight:   190 lb 12.8 oz (86.5 kg)   Height:   5\' 8"  (1.727 m)      GEN- The patient is well appearing, alert and oriented x 3 today.   HEENT:  normocephalic, atraumatic; sclera clear, conjunctiva pink; hearing intact; oropharynx clear; neck supple,   Lungs- Clear to ausculation bilaterally, normal work of breathing.  No wheezes, rales, rhonchi Heart- Regular rate and rhythm, no murmurs, rubs or gallops, PMI not laterally displaced GI- soft, non-tender, non-distended Extremities- no clubbing, cyanosis, or edema MS- no significant deformity or atrophy Skin- warm and dry, no rash or lesion Psych- euthymic mood, full affect Neuro- no gross deficits observed  Labs:   Lab Results  Component Value  Date   WBC 5.8 02/24/2016   HGB 11.4 (L) 02/24/2016   HCT 35.6 (L) 02/24/2016   MCV 90.8 02/24/2016   PLT 242 02/24/2016    Recent Labs Lab 02/24/16 0815  NA 140  K 3.7  CL 106  CO2 26  BUN 15  CREATININE 1.40*  CALCIUM 9.1  GLUCOSE 92      Radiology/Studies:  Dg Chest 2 View Result Date: 02/24/2016 CLINICAL DATA:  55 year old male with chest pain EXAM: CHEST  2 VIEW COMPARISON:  Chest radiograph dated 08/27/2014 FINDINGS: The heart size and mediastinal contours are within normal limits. Both lungs are clear. The visualized skeletal structures are unremarkable. IMPRESSION: No active cardiopulmonary disease. Electronically Signed   By: Elgie Collard M.D.   On: 02/24/2016 02:33    EKG: SB, inf/lat ST/T changes (similar to older) TELEMETRY: SB/SR, 50's-60's, 3 episodes of AV block, longest associated with v pause of 6.13seconds, all occurred early this morning/overnight, 04:41, 07:48, 07:54.  PP interval prolongation at same time of AV block is suggestive of increased vagal tone as the cause.  08/22/15: TEE Study Conclusions - Left ventricle: The cavity size was normal. Wall thickness was   increased in a pattern of moderate LVH. Systolic function was   normal. - Aortic valve: No evidence of vegetation. - Mitral valve: No evidence of vegetation. - Left atrium: No evidence of thrombus in the atrial cavity or    appendage. - Atrial septum: No defect or patent foramen ovale was identified.  08/15/15: TTE Study Conclusions - Left ventricle: The cavity size was normal. Wall thickness was   increased in a pattern of mild LVH. There does not appear to be   significant LV wall thickening at the apex. The estimated   ejection fraction was 75%. Wall motion was normal; there were no   regional wall motion abnormalities. Doppler parameters are   consistent with abnormal left ventricular relaxation (grade 1   diastolic dysfunction). The E/e&' ratio is between 8-15,   suggesting indeterminate LV filling pressure. 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 - Left atrium: The atrium was normal in size. - Inferior vena cava: The vessel was normal in size. The   respirophasic diameter changes were in the normal range (= 50%),   consistent with normal central venous pressure. 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 clinical correlation is advised -   fibroelastoma has been associated with an increased risk of   TIA/stroke. The LV apex does not appear significantly thickened,   ?diagnosis of apical HCM.  Assessment and Plan:   1. CP, NSTEMI, mild/vague ongoing discomfort though significantly improved      On heparin gtt, nitro gtt, ASA, statin,        Pending cath today  2. Nocturnal brady episodes       All occurred/patient confirmed while asleep, no hx of syncope or symptoms of bradycardia      Reports 3 years ago negative sleep study, wife states snores only occasionally       Cath is pending  Discussed case with Dr. Johney Frame, no further at this time  3.  HTN/LVH     C/w primary  team      4.AKI  Signed, Francis Dowse, PA-C 02/24/2016 9:34 AM  I have seen, examined the patient, and reviewed the above assessment and plan.  Changes to above are made where necessary.  Pt with asymptomatic nocturnal bradycardia.  PP interval prolongation associated with AV block is suggestive of high vagal tone as the cause.  He denies prior syncope.  Would not advise pacemaker for this patient currently. Ok to start low dose metoprolol.  Electrophysiology team to see as needed while here. Please call with questions.   Co Sign: Hillis Range, MD 02/24/2016 5:30 PM

## 2016-02-24 NOTE — Interval H&P Note (Signed)
Cath Lab Visit (complete for each Cath Lab visit)  Clinical Evaluation Leading to the Procedure:   ACS: Yes.    Non-ACS:    Anginal Classification: CCS IV  Anti-ischemic medical therapy: Minimal Therapy (1 class of medications)  Non-Invasive Test Results: No non-invasive testing performed  Prior CABG: No previous CABG      History and Physical Interval Note:  02/24/2016 1:31 PM  [name]  has presented today for surgery, with the diagnosis of cp  The various methods of treatment have been discussed with the patient and family. After consideration of risks, benefits and other options for treatment, the patient has consented to  Procedure(s): Left Heart Cath and Coronary Angiography (N/A) as a surgical intervention .  The patient's history has been reviewed, patient examined, no change in status, stable for surgery.  I have reviewed the patient's chart and labs.  Questions were answered to the patient's satisfaction.     Tonny Bollman

## 2016-02-24 NOTE — Progress Notes (Signed)
TR BAND REMOVAL  LOCATION:  right radial  DEFLATED PER PROTOCOL:  Yes.    TIME BAND OFF / DRESSING APPLIED:   1900   SITE UPON ARRIVAL:   Level 0  SITE AFTER BAND REMOVAL:  Level 0  CIRCULATION SENSATION AND MOVEMENT:  Within Normal Limits  Yes.    COMMENTS:    

## 2016-02-24 NOTE — Progress Notes (Signed)
Nutrition Brief Note  Patient identified on the Malnutrition Screening Tool (MST) Report. Weight loss has been minimal and is not significant for the time frame.  Wt Readings from Last 15 Encounters:  02/24/16 190 lb 12.8 oz (86.5 kg)  11/19/15 194 lb (88 kg)  11/19/15 195 lb 12.8 oz (88.8 kg)  08/22/15 198 lb (89.8 kg)  08/21/15 198 lb 12.8 oz (90.2 kg)  08/16/15 197 lb 8 oz (89.6 kg)  08/27/14 209 lb 12 oz (95.1 kg)  07/02/14 210 lb (95.3 kg)  06/18/14 203 lb (92.1 kg)  02/28/14 194 lb 9.6 oz (88.3 kg)  11/22/13 198 lb (89.8 kg)  11/20/13 197 lb (89.4 kg)  10/24/13 190 lb (86.2 kg)  10/18/13 195 lb (88.5 kg)  04/23/13 194 lb (88 kg)    Body mass index is 29.01 kg/m. Patient meets criteria for overweight based on current BMI.   Current diet order is NPO for coronary angiography later today. Labs and medications reviewed.   No nutrition interventions warranted at this time. If nutrition issues arise, please consult RD.   Joaquin Courts, RD, LDN, CNSC Pager [phone] After Hours Pager [phone]

## 2016-02-24 NOTE — ED Triage Notes (Signed)
Pt. reports intermittent central chest pain radiating to,left arm with mild SOB onset 3 weeks ago , denies emesis or diaphoresis .

## 2016-02-24 NOTE — ED Notes (Signed)
Patient does not want anymore nitro at this time.

## 2016-02-24 NOTE — Progress Notes (Signed)
ANTICOAGULATION CONSULT NOTE - Initial Consult  Pharmacy Consult for Heparin Indication: chest pain/ACS  Allergies  Allergen Reactions  . Lisinopril Swelling    Patient Measurements: Wt: 88 kg Height: 5\' 8"  (172.7 cm) IBW/kg (Calculated) : 68.4 Heparin Dosing Weight: 88 kg  Vital Signs: Temp: 98.4 F (36.9 C) (09/26 0058) Temp Source: Oral (09/26 0058) BP: 143/90 (09/26 0310) Pulse Rate: 64 (09/26 0310)  Labs:  Recent Labs  02/24/16 0115  HGB 11.4*  HCT 35.6*  PLT 242  CREATININE 1.50*  TROPONINI 0.15*    CrCl cannot be calculated (Unknown ideal weight.).   Medical History: Past Medical History:  Diagnosis Date  . Apical variant hypertrophic cardiomyopathy (HCC)   . CAD (coronary artery disease)    a. LHC in 2011 with severe dz in D1/D2 and very distal LAD--> too small for PCI and managed medically. b.  LHC in 10/2013 after an abnormal nuclear study w/ non-obst dz/  . Chronic back pain   . 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  Marland Kitchen HOH (hard of hearing)    rgiht ear  . Hyperlipidemia   . Hypertension   . Migraine headache   . Stroke [facility])     Medications:  See electronic med rec  Assessment: 55 y.o. M presents with CP. To begin heparin for r/o ACS. No AC PTA. CBC ok on admission.  Goal of Therapy:  Heparin level 0.3-0.7 units/ml Monitor platelets by anticoagulation protocol: Yes   Plan:  Heparin IV bolus 4000 units Heparin gtt at 1200 units/hr Will f/u heparin level in 6 hours Daily heparin level and CBC  Christoper Fabian, PharmD, BCPS Clinical pharmacist, pager [phone] 02/24/2016,4:19 AM

## 2016-02-24 NOTE — Progress Notes (Signed)
Pt noted to have episodes of bradycardia, down in the 40s. Pt also noted to have episodes of what appears to be a heart block as he had p-waves but no QRS complexes. Pt sleeping heavily, evening snoring at times during these episodes. Easily awakened, VSS. Laverda Page, NP on floor and made aware. Denny Peon, NP also text paged and made aware as she will be rounding on patient this morning. Will hold am dose of labetalol until team rounds.

## 2016-02-25 ENCOUNTER — Other Ambulatory Visit: Payer: Self-pay | Admitting: Cardiology

## 2016-02-25 ENCOUNTER — Telehealth: Payer: Self-pay | Admitting: Nurse Practitioner

## 2016-02-25 ENCOUNTER — Encounter (HOSPITAL_COMMUNITY): Payer: Self-pay | Admitting: Cardiovascular Disease

## 2016-02-25 DIAGNOSIS — I2510 Atherosclerotic heart disease of native coronary artery without angina pectoris: Secondary | ICD-10-CM

## 2016-02-25 DIAGNOSIS — Z9861 Coronary angioplasty status: Secondary | ICD-10-CM

## 2016-02-25 DIAGNOSIS — E785 Hyperlipidemia, unspecified: Secondary | ICD-10-CM

## 2016-02-25 DIAGNOSIS — I200 Unstable angina: Secondary | ICD-10-CM

## 2016-02-25 DIAGNOSIS — R001 Bradycardia, unspecified: Secondary | ICD-10-CM

## 2016-02-25 DIAGNOSIS — I10 Essential (primary) hypertension: Secondary | ICD-10-CM

## 2016-02-25 DIAGNOSIS — I442 Atrioventricular block, complete: Secondary | ICD-10-CM

## 2016-02-25 DIAGNOSIS — I25110 Atherosclerotic heart disease of native coronary artery with unstable angina pectoris: Secondary | ICD-10-CM

## 2016-02-25 DIAGNOSIS — I214 Non-ST elevation (NSTEMI) myocardial infarction: Secondary | ICD-10-CM

## 2016-02-25 DIAGNOSIS — N189 Chronic kidney disease, unspecified: Secondary | ICD-10-CM

## 2016-02-25 LAB — CBC
HEMATOCRIT: 38.6 % — AB (ref 39.0–52.0)
HEMOGLOBIN: 12.4 g/dL — AB (ref 13.0–17.0)
MCH: 29.3 pg (ref 26.0–34.0)
MCHC: 32.1 g/dL (ref 30.0–36.0)
MCV: 91.3 fL (ref 78.0–100.0)
Platelets: 248 10*3/uL (ref 150–400)
RBC: 4.23 MIL/uL (ref 4.22–5.81)
RDW: 12.6 % (ref 11.5–15.5)
WBC: 8.3 10*3/uL (ref 4.0–10.5)

## 2016-02-25 LAB — BASIC METABOLIC PANEL
ANION GAP: 10 (ref 5–15)
BUN: 12 mg/dL (ref 6–20)
CO2: 23 mmol/L (ref 22–32)
Calcium: 9.5 mg/dL (ref 8.9–10.3)
Chloride: 106 mmol/L (ref 101–111)
Creatinine, Ser: 1.37 mg/dL — ABNORMAL HIGH (ref 0.61–1.24)
GFR calc Af Amer: >60 mL/min (ref >60)
GFR, EST NON AFRICAN AMERICAN: 57 mL/min — AB (ref >60)
GLUCOSE: 119 mg/dL — AB (ref 65–99)
POTASSIUM: 4 mmol/L (ref 3.5–5.1)
Sodium: 139 mmol/L (ref 135–145)

## 2016-02-25 LAB — LIPID PANEL
CHOL/HDL RATIO: 4 ratio
CHOLESTEROL: 174 mg/dL (ref 0–200)
HDL: 44 mg/dL (ref >40)
LDL Cholesterol: 114 mg/dL — ABNORMAL HIGH (ref 0–99)
TRIGLYCERIDES: 82 mg/dL (ref <150)
VLDL: 16 mg/dL (ref 0–40)

## 2016-02-25 MED ORDER — CLOPIDOGREL BISULFATE 75 MG PO TABS: 75.0000 mg | ORAL_TABLET | Freq: Every day | ORAL | 12 refills | Status: DC

## 2016-02-25 MED ORDER — IRBESARTAN 300 MG PO TABS: 300.0000 mg | ORAL_TABLET | Freq: Every day | ORAL | 12 refills | Status: DC

## 2016-02-25 MED ORDER — IRBESARTAN 300 MG PO TABS
300.0000 mg | ORAL_TABLET | Freq: Every day | ORAL | Status: DC
  Administered 2016-02-25: 10:00:00 300 mg via ORAL
  Filled 2016-02-25: qty 1

## 2016-02-25 MED ORDER — ANGIOPLASTY BOOK
Freq: Once | Status: AC
  Administered 2016-02-25: 06:00:00 1
  Filled 2016-02-25: qty 1

## 2016-02-25 MED ORDER — ASPIRIN 81 MG PO TBEC: 81.0000 mg | DELAYED_RELEASE_TABLET | Freq: Every day | ORAL | Status: DC

## 2016-02-25 MED ORDER — HYDRALAZINE HCL 50 MG PO TABS: 100.0000 mg | ORAL_TABLET | Freq: Three times a day (TID) | ORAL | Status: DC

## 2016-02-25 MED ORDER — HYDRALAZINE HCL 100 MG PO TABS: 100.0000 mg | ORAL_TABLET | Freq: Three times a day (TID) | ORAL | 12 refills | Status: DC

## 2016-02-25 NOTE — Research (Signed)
DAL-gENE Informed Consent   Subject Name: [name]  Subject met inclusion and exclusion criteria.  The informed consent form, study requirements and expectations were reviewed with the subject and questions and concerns were addressed prior to the signing of the consent form.  The subject verbalized understanding of the trail requirements.  The subject agreed to participate in the DAL-GENE trial and signed the informed consent.  The informed consent was obtained prior to performance of any protocol-specific procedures for the subject.  A copy of the signed informed consent was given to the subject and a copy was placed in the subject's medical record. Copy of Main study consent given to patient for review at home as well.  Hedrick,Tammy W 02/25/2016, 8:39 AM

## 2016-02-25 NOTE — Telephone Encounter (Signed)
New message      TCM appt on 03-03-16 per Suzzette Righter, PA

## 2016-02-25 NOTE — Progress Notes (Signed)
CARDIAC REHAB PHASE I   PRE:  Rate/Rhythm: 61 SR c/ PVCs  BP:  Sitting: 171/83        SaO2: 98 RA  MODE:  Ambulation: 800 ft   POST:  Rate/Rhythm: 66 SR c/ PVCs  BP:  Sitting:  178/94         SaO2: 99 RA  Pt ambulated 800 ft on RA, independent, steady gait, tolerated well with no complaints. Completed MI/stent education with pt and wife at bedside.  Reviewed risk factors, tobacco cessation (gave pt fake cigarette), MI book, anti-platelet therapy, stent card, activity restrictions, ntg, exercise, heart healthy diet, portion control, and phase 2 cardiac rehab. Pt verbalized understanding, receptive to education. Pt agrees to phase 2 cardiac rehab referral, will send to [facility] per pt request. Pt to bed per pt request after walk, call bell within reach.     [phone] Joylene Grapes, RN, BSN 02/25/2016 9:45 AM

## 2016-02-25 NOTE — Telephone Encounter (Signed)
Patient contacted regarding discharge from [facility] on 02/25/16.  Patient understands to follow up with provider Norma Fredrickson NP on 03/03/16 at 9:30 at [facility]. Patient understands discharge instructions? yes Patient understands medications and regiment? yes Patient understands to bring all medications to this visit? yes

## 2016-02-25 NOTE — Care Management Note (Signed)
Case Management Note  Patient Details  Name: Karar Wilker MRN: [id number] Date of Birth: Nov 13, 1960  Subjective/Objective:    S/p coronary intervention, NSTEMI, will be on plavix.  Patient is for dc,  No other needs.                 Action/Plan:   Expected Discharge Date:                  Expected Discharge Plan:  Home/Self Care  In-House Referral:     Discharge planning Services  CM Consult  Post Acute Care Choice:    Choice offered to:     DME Arranged:    DME Agency:     HH Arranged:    HH Agency:     Status of Service:  Completed, signed off  If discussed at [employer] of Stay Meetings, dates discussed:    Additional Comments:  Leone Haven, RN 02/25/2016, 11:47 AM

## 2016-02-25 NOTE — Discharge Summary (Signed)
Discharge Summary    Patient ID: [name],  MRN: [id number], DOB/AGE: 08-04-1960 55 y.o.  Admit date: 02/24/2016 Discharge date: 02/25/2016  Primary Care Provider: Sanda Linger Primary Cardiologist: Dr. Katrinka Blazing   Discharge Diagnoses    Principal Problem:   Unstable angina [facility]) Active Problems:   Coronary artery disease involving native coronary artery of native heart with unstable angina pectoris [facility])   NSTEMI (non-ST elevated myocardial infarction) (HCC)   AV block   CAD S/P PCI- Ramus DES 2.5 x 38 mm    Hyperlipidemia with target LDL less than 70   Essential hypertension   Chronic renal insufficiency, stage II (mild)   Bradycardia   Allergies Allergies  Allergen Reactions  . Lisinopril Swelling    Diagnostic Studies/Procedures   Coronary Stent Intervention  Left Heart Cath and Coronary Angiography 02/24/16   1. Critical stenosis of a large ramus intermedius branch treated successfully with PCI using a long drug-eluting stent 2. Mild diffuse proximal and mid LAD stenosis with severe apical stenosis of the LAD and total occlusion of the small first diagonal subbranch supplied by left to left collaterals 3. Large, dominant RCA with minimal irregularity and nonobstructive stenosis of a large posterolateral branch 4. Moderate stenosis of the mid left circumflex and severe OM subbranch stenosis  Recommendations: The patient was loaded with Effient 60 mg on the Cath Lab table. I did not realize his history of stroke at the time. With this history, he cannot take effient as a long-term medication. Will transition him to plavix tomorrow morning. Brilinta should be avoided because of bradyarrhythmias.  _____________   History of Present Illness   55 yo with history of HTN, prior CVA, hypertensive heart disease with LVH, and nonobstructive CAD presented to ER on 02/24/16 with chest pain. He reports episodes of chest pain x 3 weeks.  Episodes have been daily and  generally brought on by stress and not exertion.  He took NTG with the first episode with relief, but episodes after that were mild and only lasted about 15 minutes, so he had not taken any further NTG until the night of 02/24/16. On the night of 02/24/16, he was watching football game around 11 and developed substernal chest aching. This was more severe than prior episodes and persisted.  He came to the ER, TnI was 0.15.  Pain decreased with NTG and is currently at 1/10.  ECG was abnormal with inferior and anterolateral T wave changes but was similar to prior.    PMH: 1. HTN 2. Hypertensive heart disease with moderate LVH.  Apical hypertrophic cardiomyopathy is listed in notes but most recent TEE in 3/17 does not mention.  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  3. Hyperlipidemia 4. CAD: LHC in 2011 with severe disease D1/D2 and distal LAD, not amenable to PCI.  LHC in 6/15 after abnormal nuclear study with nonobstructive disease.  5. H/o CVA 6. H/o migraines   Hospital Course  He was admitted and left heart catheterization was planned. Full cath report above. He had critical stenosis of his ramus intermedius branch that was successfully treated with DES. He will be on DAPT with Plavix and ASA. Brilinta was avoided because he was bradycardic. He tolerated the procedure well and was stable post cath. His right radial site was stable without hematoma.   There was some mention of apical hypertrophic  cardiomyopathy, but he recently had a TEE in March of this year that did not mention it. 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   He will continue on amlodipine, olmesartan. No beta blocker as he was bradycardiac prior to PCI with some pauses.  He will need a 30 day event monitor to further assess. He will be on high  intensity statin, his LDL was 114.  He was seen today by Dr. Herbie Baltimore and deemed suitable for discharge.  _____________  Discharge Vitals Blood pressure (!) 147/79, pulse 63, temperature 98.6 F (37 C), temperature source Oral, resp. rate 19, height 5\' 8"  (1.727 m), weight 86.4 kg (190 lb 7.6 oz), SpO2 99 %.  Filed Weights   02/24/16 0544 02/25/16 0200  Weight: 86.5 kg (190 lb 12.8 oz) 86.4 kg (190 lb 7.6 oz)    Labs & Radiologic Studies    GEN- The patient is well appearing, alert and oriented x 3 today.   HEENT: normocephalic, atraumatic; sclera clear, conjunctiva pink; hearing intact; oropharynx clear; neck supple,   Lungs- Clear to ausculation bilaterally, normal work of breathing.  No wheezes, rales, rhonchi Heart- Regular rate and rhythm, no murmurs, rubs or gallops, PMI not laterally displaced GI- soft, non-tender, non-distended Extremities- no clubbing, cyanosis, or edema MS- no significant deformity or atrophy Skin- warm and dry, no rash or lesion Psych- euthymic mood, full affect Neuro- no gross deficits observed  CBC  Recent Labs  02/24/16 0115 02/25/16 0341  WBC 5.8 8.3  HGB 11.4* 12.4*  HCT 35.6* 38.6*  MCV 90.8 91.3  PLT 242 248   Basic Metabolic Panel  Recent Labs  02/24/16 1149 02/25/16 0341  NA 139 139  K 3.8 4.0  CL 109 106  CO2 25 23  GLUCOSE 78 119*  BUN 13 12  CREATININE 1.24 1.37*  CALCIUM 9.1 9.5   Cardiac Enzymes  Recent Labs  02/24/16 0815 02/24/16 1149 02/24/16 1701  TROPONINI 0.11* 0.09* 0.09*   Fasting Lipid Panel  Recent Labs  02/25/16 0341  CHOL 174  HDL 44  LDLCALC 114*  TRIG 82  CHOLHDL 4.0   Thyroid Function Tests  Recent Labs  02/24/16 0815  TSH 2.409    Dg Chest 2 View  Result Date: 02/24/2016 CLINICAL DATA:  55 year old male with chest pain EXAM: CHEST  2 VIEW COMPARISON:  Chest radiograph dated 08/27/2014 FINDINGS: The heart size and mediastinal contours are within normal limits. Both lungs are  clear. The visualized skeletal structures are unremarkable. IMPRESSION: No active cardiopulmonary disease. Electronically Signed   By: Elgie Collard M.D.   On: 02/24/2016 02:33    Disposition   Pt is being discharged home today in good condition.  Follow-up Plans & Appointments    Follow-up Information    Norma Fredrickson, NP Follow up on 03/03/2016.   Specialties:  Nurse Practitioner, Interventional Cardiology, Cardiology, Radiology Why:  at 9:30 am for hospital follow up.  Contact information: [address] [place] [address] [phone]        [facility] [address] [facility] Follow up on 03/08/2016.   Specialty:  Cardiology Why:  at 7:45 am for echocardiogram  Contact information: [address] [place] [address] [phone]         Discharge Instructions    Amb Referral to Cardiac Rehabilitation    Complete by:  As directed    Diagnosis:   Coronary Stents NSTEMI  Diet - low sodium heart healthy    Complete by:  As directed    Discharge instructions    Complete by:  As directed    Radial Site Care Refer to this sheet in the next few weeks. These instructions provide you with information on caring for yourself after your procedure. Your caregiver may also give you more specific instructions. Your treatment has been planned according to current medical practices, but problems sometimes occur. Call your caregiver if you have any problems or questions after your procedure. HOME CARE INSTRUCTIONS You may shower the day after the procedure.Remove the bandage (dressing) and gently wash the site with plain soap and water.Gently pat the site dry.  Do not apply powder or lotion to the site.  Do not submerge the affected site in water for 3 to 5 days.  Inspect the site at least twice daily.  Do not flex or bend the affected arm for 24 hours.  No lifting over 5 pounds (2.3 kg) for 5 days after your procedure.  Do not  drive home if you are discharged the same day of the procedure. Have someone else drive you.  You may drive 24 hours after the procedure unless otherwise instructed by your caregiver.  What to expect: Any bruising will usually fade within 1 to 2 weeks.  Blood that collects in the tissue (hematoma) may be painful to the touch. It should usually decrease in size and tenderness within 1 to 2 weeks.  SEEK IMMEDIATE MEDICAL CARE IF: You have unusual pain at the radial site.  You have redness, warmth, swelling, or pain at the radial site.  You have drainage (other than a small amount of blood on the dressing).  You have chills.  You have a fever or persistent symptoms for more than 72 hours.  You have a fever and your symptoms suddenly get worse.  Your arm becomes pale, cool, tingly, or numb.  You have heavy bleeding from the site. Hold pressure on the site.   NO HEAVY LIFTING OR SEXUAL ACTIVITY for 7 DAYS. NO DRIVING for 3-5 DAYS. NO SOAKING BATHS, HOT TUBS, POOLS, ETC., for 7 DAYS   Increase activity slowly    Complete by:  As directed       Discharge Medications   Discharge Medication List as of 02/25/2016 11:28 AM    START taking these medications   Details  aspirin EC 81 MG EC tablet Take 1 tablet (81 mg total) by mouth daily., Starting Wed 02/25/2016, OTC    clopidogrel (PLAVIX) 75 MG tablet Take 1 tablet (75 mg total) by mouth daily with breakfast., Starting Thu 02/26/2016, Normal    irbesartan (AVAPRO) 300 MG tablet Take 1 tablet (300 mg total) by mouth daily., Starting Thu 02/26/2016, Normal      CONTINUE these medications which have CHANGED   Details  hydrALAZINE (APRESOLINE) 100 MG tablet Take 1 tablet (100 mg total) by mouth 3 (three) times daily., Starting Wed 02/25/2016, Normal      CONTINUE these medications which have NOT CHANGED   Details  amLODipine (NORVASC) 10 MG tablet TAKE 1 TABLET BY MOUTH EVERY DAY, Normal    nitroGLYCERIN (NITROSTAT) 0.4 MG SL tablet Place 1  tablet (0.4 mg total) under the tongue every 5 (five) minutes as needed for chest pain., Starting Thu 02/12/2016, Until Wed 05/12/2016, Normal    rosuvastatin (CRESTOR) 40 MG tablet Take 1 tablet (40 mg total) by mouth daily., Starting Mon 10/06/2015, Normal    thiamine (  VITAMIN B-1) 100 MG tablet Take 1 tablet (100 mg total) by mouth daily., Starting Mon 11/24/2015, Normal    vortioxetine HBr (TRINTELLIX) 10 MG TABS Take 1 tablet (10 mg total) by mouth daily., Starting Wed 11/19/2015, Sample      STOP taking these medications     labetalol (NORMODYNE) 300 MG tablet      olmesartan (BENICAR) 40 MG tablet          Aspirin prescribed at discharge?  Yes High Intensity Statin Prescribed? (Lipitor 40-80mg  or Crestor 20-40mg ): Yes Beta Blocker Prescribed? No: Bradycardia For EF 40% or less, Was ACEI/ARB Prescribed? Yes, EF normal, but previously on ARB at home ADP Receptor Inhibitor Prescribed? (i.e. Plavix etc.-Includes Medically Managed Patients): Yes For EF <40%, Aldosterone Inhibitor Prescribed? No: EF normal Was EF assessed during THIS hospitalization? Yes Was Cardiac Rehab II ordered? (Included Medically managed Patients): Yes   Outstanding Labs/Studies   BMP  Duration of Discharge Encounter   Greater than 30 minutes including physician time.  Signed, Little Ishikawa NP 02/25/2016, 11:25 AM   I have seen, examined and evaluated the patient this Morning along with Suzzette Righter, NP.  After reviewing all the available data and chart, we discussed the patients laboratory, study & physical findings as well as symptoms in detail. I agree with her findings, examination as well as discharge summaries noted above.  Patient with known moderate CAD, with significant hypertension presented with symptoms concerning for unstable angina. He was also noted to have significant sinus bradycardia with brief episodes of complete heart block on the first night and morning while sleeping. He was evaluated  by EP who felt that we should simply hold his beta blocker and the fact this happens at nighttime was less concerning. He says when he underwent cardiac catheterization which revealed a severe lesion in the ramus intermedius treated with DES stent. He has been astigmatic ever since the PCI. Did well overnight. No issues post cath. We did hold his labetalol 300 mg and he has had bradycardia, but no pauses. Unfortunately his blood pressure has gone up. Plan will be to increase his hydralazine and restart his Diovan was held for his catheterization. He will need a 30 day event monitor to reassess his bradycardia while off of beta blocker. With him having coronary disease, we would like to have him on a beta blocker.    Otherwise, he is stable for discharge with follow-up as noted.    Bryan Lemma, M.D., M.S. Interventional Cardiologist   Pager # [phone] Phone # [phone] [address] [place], [place] [address]

## 2016-03-02 ENCOUNTER — Encounter: Payer: Self-pay | Admitting: Nurse Practitioner

## 2016-03-03 ENCOUNTER — Encounter: Payer: Self-pay | Admitting: Nurse Practitioner

## 2016-03-03 ENCOUNTER — Ambulatory Visit (INDEPENDENT_AMBULATORY_CARE_PROVIDER_SITE_OTHER): Payer: BLUE CROSS/BLUE SHIELD | Admitting: Nurse Practitioner

## 2016-03-03 VITALS — BP 142/76 | HR 88 | Ht 68.0 in | Wt 187.0 lb

## 2016-03-03 DIAGNOSIS — I10 Essential (primary) hypertension: Secondary | ICD-10-CM

## 2016-03-03 DIAGNOSIS — I25119 Atherosclerotic heart disease of native coronary artery with unspecified angina pectoris: Secondary | ICD-10-CM | POA: Diagnosis not present

## 2016-03-03 DIAGNOSIS — E7800 Pure hypercholesterolemia, unspecified: Secondary | ICD-10-CM

## 2016-03-03 NOTE — Patient Instructions (Addendum)
We will be checking the following labs today - NONE   Medication Instructions:    Continue with your current medicines. BUT  I am adding Imdur 30 mg to take one a day - if this causes a headache - switch to taking at night.  Use your NTG under your tongue for recurrent chest pain. May take one tablet every 5 minutes. If you are still having discomfort after 3 tablets in 15 minutes, call 911.    Testing/Procedures To Be Arranged:  N/A  Follow-Up:   See me early next week.     Other Special Instructions:   If your symptoms get worse/progress - use your sl NTG - go to the ER    If you need a refill on your cardiac medications before your next appointment, please call your pharmacy.   Call the [facility] office at [phone] if you have any questions, problems or concerns.

## 2016-03-03 NOTE — Progress Notes (Signed)
CARDIOLOGY OFFICE NOTE  Date:  03/03/2016    [name] Date of Birth: 1960-07-20 Medical Record #[id number]  PCP:  Sanda Linger, MD  Cardiologist:  Katrinka Blazing   Chief Complaint  Patient presents with  . Coronary Artery Disease    TOC visit - seen for Dr. Katrinka Blazing    History of Present Illness: Adaiah Morken is a 55 y.o. male who presents today for a post hospital visit. This is a TOC visit. Seen for Dr. Katrinka Blazing.   He has a history of HTN, prior CVA, hypertensive heart disease with LVH, and nonobstructive CAD.   He presented to ER on 02/24/16 with chest pain. TnI was 0.15. Pain decreased with NTG. ECG was abnormal with inferior and anterolateral T wave changes but was similar to prior. He was admitted and left heart catheterization was planned.  He had critical stenosis of his ramus intermedius branch that was successfully treated with DES. He will be on DAPT with Plavix and ASA. Brilinta was avoided because he was bradycardic. He was to have a 30 day event monitor placed. He is on high dose intensity statin.   There was some mention of apical hypertrophic cardiomyopathy, but he recently had a TEE in March of this year that did not mention it.   Comes in today. Here with his wife. He notes he has continued to have chest pain ever since his discharge a week ago - not getting any worse but not improving. Seems worse with exertion - going up steps, cutting pipe, etc. Described that now is more like a pressure sensation. Different from his presentation that led to admission. He has used some NTG on 2 occasions with some "easing" of symptoms. 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 He is taking his Plavix. Not dizzy or lightheaded. No syncope.   PMH: 1. HTN 2. Hypertensive heart disease with moderate LVH. Apical hypertrophic cardiomyopathy is listed in notes but most recent TEE in 3/17 does not mention.  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  3. Hyperlipidemia 4. CAD: LHC in 2011 with severe disease D1/D2 and distal LAD, not amenable to PCI. LHC in 6/15 after abnormal nuclear study with nonobstructive disease.  5. H/o CVA 6. H/o migraines  Past Medical History:  Diagnosis Date  . Apical variant hypertrophic cardiomyopathy (HCC)   . CAD (coronary artery disease)    a. LHC in 2011 with severe dz in D1/D2 and very distal LAD--> too small for PCI and managed medically. b.  LHC in 10/2013 after an abnormal nuclear study w/ non-obst dz/c. 01/2016 NSTEMI 2.5 x 38 mm Promus DES to Ramus    . Chronic back pain   . 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  Marland Kitchen HOH (hard of hearing)    rgiht ear  . Hyperlipidemia   . Hypertension   . Migraine headache   . Stroke [facility])     Past Surgical History:  Procedure Laterality Date  . CARDIAC CATHETERIZATION N/A 02/24/2016   Procedure: Left Heart Cath  and Coronary Angiography;  Surgeon: [name], MD;  [facility];  Service: Cardiovascular;  Laterality: N/A;  . CARDIAC CATHETERIZATION N/A 02/24/2016   Procedure: Coronary Stent Intervention;  Surgeon: [name], MD;  [facility];  Service: Cardiovascular;  Laterality: N/A;  . INGUINAL HERNIA REPAIR    . LEFT HEART CATHETERIZATION WITH CORONARY ANGIOGRAM N/A 11/22/2013   Procedure: LEFT HEART CATHETERIZATION WITH CORONARY ANGIOGRAM;  Surgeon: [name], MD;  [facility];  Service: Cardiovascular;  Laterality: N/A;  . TEE WITHOUT CARDIOVERSION N/A 08/22/2015   Procedure:  TRANSESOPHAGEAL ECHOCARDIOGRAM (TEE);  Surgeon: Vesta MixerPhilip J Nahser, MD;  [facility];  Service: Cardiovascular;  Laterality: N/A;     Medications: Current Outpatient Prescriptions  Medication Sig Dispense Refill  . amLODipine (NORVASC) 10 MG tablet TAKE 1 TABLET BY MOUTH EVERY DAY 30 tablet 11  . aspirin EC 81 MG EC tablet Take 1 tablet (81 mg total) by mouth daily.    . clopidogrel (PLAVIX) 75 MG tablet Take 1 tablet (75 mg total) by mouth daily with breakfast. 30 tablet 12  . hydrALAZINE (APRESOLINE) 100 MG tablet Take 1 tablet (100 mg total) by mouth 3 (three) times daily. 90 tablet 12  . irbesartan (AVAPRO) 300 MG tablet Take 1 tablet (300 mg total) by mouth daily. 30 tablet 12  . nitroGLYCERIN (NITROSTAT) 0.4 MG SL tablet Place 1 tablet (0.4 mg total) under the tongue every 5 (five) minutes as needed for chest pain. 25 tablet 1  . rosuvastatin (CRESTOR) 40 MG tablet Take 1 tablet (40 mg total) by mouth daily. 90 tablet 3  . thiamine (VITAMIN B-1) 100 MG tablet Take 1 tablet (100 mg total) by mouth daily. 90 tablet 3  . vortioxetine HBr (TRINTELLIX) 10 MG TABS Take 1 tablet (10 mg total) by mouth daily. 28 tablet 0   No current facility-administered medications for this visit.     Allergies: Allergies  Allergen Reactions  . Lisinopril Swelling    Social History: The patient  reports that he has been smoking Cigarettes.  He has been smoking about 0.00 packs per day. He has never used smokeless tobacco. He reports that he does not drink alcohol or use drugs.   Family History: The patient's family history includes Colon cancer in his paternal uncle; Colon cancer (age of onset: 4766) in his father; Heart failure in his mother; Hypertension in his father and mother.   Review of Systems: Please see the history of present illness.   Otherwise, the review of systems is positive for none.   All other systems are reviewed and negative.   Physical Exam: VS:  BP (!) 142/76   Pulse  88   Ht 5\' 8"  (1.727 m)   Wt 187 lb (84.8 kg)   BMI 28.43 kg/m  .  BMI Body mass index is 28.43 kg/m.  Wt Readings from Last 3 Encounters:  03/03/16 187 lb (84.8 kg)  02/25/16 190 lb 7.6 oz (86.4 kg)  11/19/15 194 lb (88 kg)    General: Pleasant. Well developed, well nourished and in no acute distress.   HEENT: Normal.  Neck: Supple, no JVD, carotid bruits, or masses noted.  Cardiac: Regular rate and rhythm. No murmurs, rubs, or gallops. No edema.  Respiratory:  Lungs are clear to auscultation bilaterally with normal work of breathing.  GI: Soft and nontender.  MS: No deformity or atrophy. Gait and ROM intact.  Skin: Warm and dry. Color is  normal.  Neuro:  Strength and sensation are intact and no gross focal deficits noted.  Psych: Alert, appropriate and with normal affect.   LABORATORY DATA:  EKG:  EKG is ordered today. This shows NSR with inferior and anterolateral T wave changes.   Lab Results  Component Value Date   WBC 8.3 02/25/2016   HGB 12.4 (L) 02/25/2016   HCT 38.6 (L) 02/25/2016   PLT 248 02/25/2016   GLUCOSE 119 (H) 02/25/2016   CHOL 174 02/25/2016   TRIG 82 02/25/2016   HDL 44 02/25/2016   LDLCALC 114 (H) 02/25/2016   ALT 12 01/06/2016   AST 14 01/06/2016   NA 139 02/25/2016   K 4.0 02/25/2016   CL 106 02/25/2016   CREATININE 1.37 (H) 02/25/2016   BUN 12 02/25/2016   CO2 23 02/25/2016   TSH 2.409 02/24/2016   PSA 1.77 08/27/2014   INR 1.06 02/24/2016   HGBA1C  09/17/2009    5.1 (NOTE)                                                                       According to the ADA Clinical Practice Recommendations for 2011, when HbA1c is used as a screening test:   >=6.5%   Diagnostic of Diabetes Mellitus           (if abnormal result  is confirmed)  5.7-6.4%   Increased risk of developing Diabetes Mellitus  References:Diagnosis and Classification of Diabetes Mellitus,Diabetes Care,2011,34(Suppl 1):S62-S69 and Standards of Medical Care in         Diabetes -  2011,Diabetes Care,2011,34  (Suppl 1):S11-S61.    BNP (last 3 results) No results for input(s): BNP in the last 8760 hours.  ProBNP (last 3 results) No results for input(s): PROBNP in the last 8760 hours.   Other Studies Reviewed Today: Coronary Stent Intervention - [facility]  Left Heart Cath and Coronary Angiography 02/24/16   1. Critical stenosis of a large ramus intermedius branch treated successfully with PCI using a long drug-eluting stent 2. Mild diffuse proximal and mid LAD stenosis with severe apical stenosis of the LAD and total occlusion of the small first diagonal subbranch supplied by left to left collaterals 3. Large, dominant RCA with minimal irregularity and nonobstructive stenosis of a large posterolateral branch 4. Moderate stenosis of the mid left circumflex and severe OM subbranch stenosis  Recommendations: The patient was loaded with Effient 60 mg on the Cath Lab table. I did not realize his history of stroke at the time. With this history, he cannot take effient as a long-term medication. Will transition him to plavix tomorrow morning. Brilinta should be avoided because of bradyarrhythmias.     TEE Study Conclusions from 07/2015  - Left ventricle: The cavity size was normal. Wall thickness was   increased in a pattern of moderate LVH. Systolic function was   normal. - Aortic valve: No evidence of vegetation. - Mitral valve: No evidence of vegetation. - Left atrium: No evidence of thrombus in the atrial cavity or   appendage. - Atrial septum: No defect or patent foramen ovale was identified.   Assessment/Plan: 1. Recent NSTEMI with PCI to the ramus intermediate (2.5 x 38) with known residual disease - - There was an  equal size subbranch of the ramus that was jailed from the stent, but there was noted TIMI 3 flow and the patient was initially chest pain-free at the completion of the procedure. The vessel was diffusely diseased beyond the bifurcation point and  required a long stent. Now presenting with about a week long of chest pain - different from prior presentation but still worrisome. EKG with new/evolving changes - reviewed with Dr. Eden Emms here in the office today - patient does not wish to go back to the hospital at this time - will add Imdur 30 mg a day - see back early next week. He is to go to the ER if continues to have symptoms or worsening/progressive symptoms. He is agreeable. Discussed with Dr. Eden Emms who is in agreement with this plan. For echo next week as well.   2. HTN - BP ok on current regimen.   3. HLD - on statin therapy.   4. Bradycardia - to wear an event monitor - not being placed until Monday - currently does not appear to be an issue.   Current medicines are reviewed with the patient today.  The patient does not have concerns regarding medicines other than what has been noted above.  The following changes have been made:  See above.  Labs/ tests ordered today include:    Orders Placed This Encounter  Procedures  . Basic metabolic panel  . CBC  . EKG 12-Lead     Disposition:   FU with me early next week or in the ER as advised.   Patient is agreeable to this plan and will call if any problems develop in the interim.   Signed: Rosalio Macadamia, RN, ANP-C 03/03/2016 10:14 AM  [facility] [address] [place], [place]  [address] Phone: [phone] Fax: [phone]

## 2016-03-04 ENCOUNTER — Other Ambulatory Visit: Payer: Self-pay | Admitting: Cardiology

## 2016-03-04 ENCOUNTER — Other Ambulatory Visit: Payer: Self-pay | Admitting: *Deleted

## 2016-03-04 ENCOUNTER — Telehealth: Payer: Self-pay | Admitting: Nurse Practitioner

## 2016-03-04 DIAGNOSIS — R001 Bradycardia, unspecified: Secondary | ICD-10-CM

## 2016-03-04 MED ORDER — ISOSORBIDE MONONITRATE ER 30 MG PO TB24: 30.0000 mg | ORAL_TABLET | Freq: Every day | ORAL | 11 refills | Status: DC

## 2016-03-04 NOTE — Telephone Encounter (Signed)
S/w pt's wife per DPR sent in Imdur ( 30 mg ) daily per pt's requested pharmacy and Norma Fredrickson, NP, ov note.

## 2016-03-04 NOTE — Telephone Encounter (Signed)
New message      Pt was seen yesterday.  Wife thinks a new presc was to have been called in to [facility] [address].  They do not have a presc.  Please call wife and let her know if a new presc is to be called in.

## 2016-03-08 ENCOUNTER — Other Ambulatory Visit: Payer: Self-pay

## 2016-03-08 ENCOUNTER — Ambulatory Visit (HOSPITAL_COMMUNITY): Payer: BLUE CROSS/BLUE SHIELD | Attending: Cardiovascular Disease

## 2016-03-08 ENCOUNTER — Ambulatory Visit (INDEPENDENT_AMBULATORY_CARE_PROVIDER_SITE_OTHER): Payer: BLUE CROSS/BLUE SHIELD

## 2016-03-08 DIAGNOSIS — I214 Non-ST elevation (NSTEMI) myocardial infarction: Secondary | ICD-10-CM | POA: Insufficient documentation

## 2016-03-08 DIAGNOSIS — R001 Bradycardia, unspecified: Secondary | ICD-10-CM | POA: Diagnosis not present

## 2016-03-08 NOTE — Progress Notes (Signed)
CARDIOLOGY OFFICE NOTE  Date:  03/09/2016    [name] Date of Birth: [birth year] Medical Record #[id number]  PCP:  Sanda Linger, MD  Cardiologist:  Kyra Manges    Chief Complaint  Patient presents with  . Chest Pain  . Coronary Artery Disease    1 week check - seen for Dr. Katrinka Blazing    History of Present Illness: [name] is a 55 y.o. male who presents today for a one week check. Seen for Dr. Katrinka Blazing.   He has a history of HTN, prior CVA, hypertensive heart disease with LVH, and nonobstructive CAD.   He presented to ER on 9/26/17with chest pain. TnI was 0.15. Pain decreased with NTG. ECG was abnormal with inferior and anterolateral T wave changes but was similar to prior. He was admitted and left heart catheterization was planned.  He had critical stenosis of his ramus intermedius branch that was successfully treated with DES. He will be on DAPT with Plavix and ASA. Brilinta was avoided because he was bradycardic. He was to have a 30 day event monitor placed. He is on high dose intensity statin.   There was some mention of apical hypertrophic cardiomyopathy, but he recently had a TEE in March of this year that did not mention it.   I saw him last week - continued to have chest pain - using NTG. EKG abnormal. 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 Discussed with Dr. Eden Emms (DOD) and we placed him on Imdur.   Comes in today. Here with his wife. He feels better. Notes dramatic improvement in his chest pain with the addition of Imdur. Still easily fatigued - hard to keep up at work - says if he does too much - "will feel something building up". No sl NTG used since last visit here. Taking the Imdur at night now due to headache which is improving. Overall he and his wife feel like he is doing better.   PMH: 1. HTN 2. Hypertensive heart disease with moderate LVH. Apical hypertrophic cardiomyopathy is listed in notes  but most recent TEE in 3/17 does not mention.  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  3. Hyperlipidemia 4. CAD: LHC in 2011 with severe disease D1/D2 and distal LAD, not amenable to PCI. LHC in 6/15 after abnormal nuclear study with nonobstructive disease.  5. H/o CVA 6. H/o migraines   Past Medical History:  Diagnosis Date  . Apical variant hypertrophic cardiomyopathy (HCC)   . CAD (coronary artery disease)    a. LHC in 2011 with severe dz in D1/D2 and very distal LAD--> too small for PCI and managed medically. b.  LHC in 10/2013 after an abnormal nuclear study w/ non-obst dz/c. 01/2016 NSTEMI 2.5 x 38 mm Promus DES to Ramus    . Chronic back pain   . 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  Marland Kitchen HOH (hard of hearing)    rgiht ear  . Hyperlipidemia   . Hypertension   . Migraine headache   . Stroke [facility])  Past Surgical History:  Procedure Laterality Date  . CARDIAC CATHETERIZATION N/A 02/24/2016   Procedure: Left Heart Cath and Coronary Angiography;  Surgeon: Tonny Bollman, MD;  Location: [facility] INVASIVE CV LAB;  Service: Cardiovascular;  Laterality: N/A;  . CARDIAC CATHETERIZATION N/A 02/24/2016   Procedure: Coronary Stent Intervention;  Surgeon: Tonny Bollman, MD;  Location: [facility] INVASIVE CV LAB;  Service: Cardiovascular;  Laterality: N/A;  . INGUINAL HERNIA REPAIR    . LEFT HEART CATHETERIZATION WITH CORONARY ANGIOGRAM N/A 11/22/2013   Procedure: LEFT HEART CATHETERIZATION WITH CORONARY ANGIOGRAM;  Surgeon: [name], MD;  Location: [facility] CATH LAB;  Service: Cardiovascular;  Laterality: N/A;  .  TEE WITHOUT CARDIOVERSION N/A 08/22/2015   Procedure: TRANSESOPHAGEAL ECHOCARDIOGRAM (TEE);  Surgeon: Vesta Mixer, MD;  Location: [facility] ENDOSCOPY;  Service: Cardiovascular;  Laterality: N/A;     Medications: Current Outpatient Prescriptions  Medication Sig Dispense Refill  . amLODipine (NORVASC) 10 MG tablet TAKE 1 TABLET BY MOUTH EVERY DAY 30 tablet 11  . aspirin EC 81 MG EC tablet Take 1 tablet (81 mg total) by mouth daily.    . clopidogrel (PLAVIX) 75 MG tablet Take 1 tablet (75 mg total) by mouth daily with breakfast. 30 tablet 12  . hydrALAZINE (APRESOLINE) 100 MG tablet Take 1 tablet (100 mg total) by mouth 3 (three) times daily. 90 tablet 12  . irbesartan (AVAPRO) 300 MG tablet Take 1 tablet (300 mg total) by mouth daily. 30 tablet 12  . isosorbide mononitrate (IMDUR) 30 MG 24 hr tablet Take 1 tablet (30 mg total) by mouth daily. 30 tablet 11  . nitroGLYCERIN (NITROSTAT) 0.4 MG SL tablet Place 1 tablet (0.4 mg total) under the tongue every 5 (five) minutes as needed for chest pain. 25 tablet 1  . rosuvastatin (CRESTOR) 40 MG tablet Take 1 tablet (40 mg total) by mouth daily. 90 tablet 3  . thiamine (VITAMIN B-1) 100 MG tablet Take 1 tablet (100 mg total) by mouth daily. 90 tablet 3  . vortioxetine HBr (TRINTELLIX) 10 MG TABS Take 1 tablet (10 mg total) by mouth daily. 28 tablet 0   No current facility-administered medications for this visit.     Allergies: Allergies  Allergen Reactions  . Lisinopril Swelling    Social History: The patient  reports that he has been smoking Cigarettes.  He has been smoking about 0.00 packs per day. He has never used smokeless tobacco. He reports that he does not drink alcohol or use drugs.   Family History: The patient's family history includes Colon cancer in his paternal uncle; Colon cancer (age of onset: 93) in his father; Heart failure in his mother; Hypertension in his father and mother.   Review of Systems: Please see the history of  present illness.   Otherwise, the review of systems is positive for none.   All other systems are reviewed and negative.   Physical Exam: VS:  BP (!) 142/88   Pulse 70   Ht 5\' 8"  (1.727 m)   Wt 191 lb 12.8 oz (87 kg)   BMI 29.16 kg/m  .  BMI Body mass index is 29.16 kg/m.  Wt Readings from Last 3 Encounters:  03/09/16 191 lb 12.8 oz (87 kg)  03/03/16 187 lb (84.8 kg)  02/25/16 190 lb 7.6 oz (86.4 kg)    General: Pleasant. Well developed, well nourished and in no acute distress.   HEENT: Normal.  Neck: Supple, no JVD, carotid bruits, or masses noted.  Cardiac: Regular rate  and rhythm. No murmurs, rubs, or gallops. No edema.  Respiratory:  Lungs are clear to auscultation bilaterally with normal work of breathing.  GI: Soft and nontender.  MS: No deformity or atrophy. Gait and ROM intact.  Skin: Warm and dry. Color is normal.  Neuro:  Strength and sensation are intact and no gross focal deficits noted.  Psych: Alert, appropriate and with normal affect.   LABORATORY DATA:  EKG:  EKG is ordered today. This demonstrates NSR with LVH and marked T wave abnormality. Reviewed with Dr. Katrinka Blazing.  Lab Results  Component Value Date   WBC 8.3 02/25/2016   HGB 12.4 (L) 02/25/2016   HCT 38.6 (L) 02/25/2016   PLT 248 02/25/2016   GLUCOSE 119 (H) 02/25/2016   CHOL 174 02/25/2016   TRIG 82 02/25/2016   HDL 44 02/25/2016   LDLCALC 114 (H) 02/25/2016   ALT 12 01/06/2016   AST 14 01/06/2016   NA 139 02/25/2016   K 4.0 02/25/2016   CL 106 02/25/2016   CREATININE 1.37 (H) 02/25/2016   BUN 12 02/25/2016   CO2 23 02/25/2016   TSH 2.409 02/24/2016   PSA 1.77 08/27/2014   INR 1.06 02/24/2016   HGBA1C  09/17/2009    5.1 (NOTE)                                                                       According to the ADA Clinical Practice Recommendations for 2011, when HbA1c is used as a screening test:   >=6.5%   Diagnostic of Diabetes Mellitus           (if abnormal result  is confirmed)   5.7-6.4%   Increased risk of developing Diabetes Mellitus  References:Diagnosis and Classification of Diabetes Mellitus,Diabetes Care,2011,34(Suppl 1):S62-S69 and Standards of Medical Care in         Diabetes - 2011,Diabetes Care,2011,34  (Suppl 1):S11-S61.    BNP (last 3 results) No results for input(s): BNP in the last 8760 hours.  ProBNP (last 3 results) No results for input(s): PROBNP in the last 8760 hours.   Other Studies Reviewed Today:  Coronary Stent Intervention - [facility]  Left Heart Cath and Coronary Angiography 02/24/16   1. Critical stenosis of a large ramus intermedius branch treated successfully with PCI using a long drug-eluting stent 2. Mild diffuse proximal and mid LAD stenosis with severe apical stenosis of the LAD and total occlusion of the small first diagonal subbranch supplied by left to left collaterals 3. Large, dominant RCA with minimal irregularity and nonobstructive stenosis of a large posterolateral branch 4. Moderate stenosis of the mid left circumflex and severe OM subbranch stenosis  Recommendations: The patient was loaded with Effient 60 mg on the Cath Lab table. I did not realize his history of stroke at the time. With this history, he cannot take effient as a long-term medication. Will transition him to plavix tomorrow morning. Brilinta should be avoided because of bradyarrhythmias.     TEE Study Conclusions from 07/2015  - Left ventricle: The cavity size was normal. Wall thickness was increased in a pattern of moderate LVH. Systolic function was normal. - Aortic valve: No evidence of vegetation. - Mitral valve: No evidence of vegetation. - Left atrium: No evidence  of thrombus in the atrial cavity or appendage. - Atrial septum: No defect or patent foramen ovale was identified.   Assessment/Plan: 1. Recent NSTEMI with PCI to the ramus intermediate (2.5 x 38) with known residual disease - - There was an equal size subbranch of the  ramus that was jailed from the stent, but there was noted TIMI 3 flow and the patient was initially chest pain-free at the completion of the procedure. The vessel was diffusely diseased beyond the bifurcation point and required a long stent.   He has had recurrent chest pain since his PCI - this has improved with the addition of Imdur. EKG remains unchanged. Reviewed last several EKGs with Dr. Katrinka BlazingSmith - felt to also be impacted by LVH. Will need to optimize medicines and try to get his symptoms stable. It is not surprising to Dr. Katrinka BlazingSmith that he has had chest pain upon review of his cath films. Will leave him on his current dose of Imdur for now - but could certainly increase the dose or consider addition of Ranexa.  2. HTN - BP ok on current regimen. He has not had his medicines yet today.   3. HLD - on statin therapy.   4. Bradycardia - to wear an event monitor - not being placed until Monday - currently does not appear to be an issue.   5. Tobacco abuse - needs total cessation.    Current medicines are reviewed with the patient today.  The patient does not have concerns regarding medicines other than what has been noted above.  The following changes have been made:  See above.  Labs/ tests ordered today include:    Orders Placed This Encounter  Procedures  . Basic metabolic panel  . CBC  . EKG 12-Lead     Disposition:   FU with me in 3 weeks and Dr. Katrinka BlazingSmith in 2 months.    Patient is agreeable to this plan and will call if any problems develop in the interim.   Signed: Rosalio MacadamiaLori C. Viveca Beckstrom, RN, ANP-C 03/09/2016 10:23 AM  [facility] [address] [place]  [address] Phone: [phone] Fax: [phone]

## 2016-03-09 ENCOUNTER — Encounter: Payer: Self-pay | Admitting: Nurse Practitioner

## 2016-03-09 ENCOUNTER — Ambulatory Visit (INDEPENDENT_AMBULATORY_CARE_PROVIDER_SITE_OTHER): Payer: BLUE CROSS/BLUE SHIELD | Admitting: Nurse Practitioner

## 2016-03-09 VITALS — BP 142/88 | HR 70 | Ht 68.0 in | Wt 191.8 lb

## 2016-03-09 DIAGNOSIS — E7800 Pure hypercholesterolemia, unspecified: Secondary | ICD-10-CM | POA: Diagnosis not present

## 2016-03-09 DIAGNOSIS — I25119 Atherosclerotic heart disease of native coronary artery with unspecified angina pectoris: Secondary | ICD-10-CM

## 2016-03-09 DIAGNOSIS — I10 Essential (primary) hypertension: Secondary | ICD-10-CM

## 2016-03-09 LAB — CBC
HCT: 37.4 % — ABNORMAL LOW (ref 38.5–50.0)
Hemoglobin: 12.7 g/dL — ABNORMAL LOW (ref 13.2–17.1)
MCH: 29.9 pg (ref 27.0–33.0)
MCHC: 34 g/dL (ref 32.0–36.0)
MCV: 88 fL (ref 80.0–100.0)
MPV: 9.7 fL (ref 7.5–12.5)
Platelets: 332 10*3/uL (ref 140–400)
RBC: 4.25 MIL/uL (ref 4.20–5.80)
RDW: 12.4 % (ref 11.0–15.0)
WBC: 8.2 10*3/uL (ref 3.8–10.8)

## 2016-03-09 LAB — BASIC METABOLIC PANEL
BUN: 15 mg/dL (ref 7–25)
CO2: 25 mmol/L (ref 20–31)
Calcium: 9.7 mg/dL (ref 8.6–10.3)
Chloride: 105 mmol/L (ref 98–110)
Creat: 1.33 mg/dL (ref 0.70–1.33)
Glucose, Bld: 83 mg/dL (ref 65–99)
Potassium: 4 mmol/L (ref 3.5–5.3)
Sodium: 140 mmol/L (ref 135–146)

## 2016-03-09 NOTE — Patient Instructions (Addendum)
We will be checking the following labs today - CBC and BMET   Medication Instructions:    Continue with your current medicines.     Testing/Procedures To Be Arranged:  N/A  Follow-Up:   See me in about 3 weeks  See Dr. Katrinka Blazing in about 2 months.    Other Special Instructions:   Keep working on stopping smoking!    If you need a refill on your cardiac medications before your next appointment, please call your pharmacy.   Call the [facility] office at [phone] if you have any questions, problems or concerns.

## 2016-03-10 ENCOUNTER — Telehealth: Payer: Self-pay | Admitting: Interventional Cardiology

## 2016-03-10 NOTE — Telephone Encounter (Signed)
I spoke with [employer]. They report pt had auto detected 3.6 second pause at 7 AM Central Time today. They were unable to reach pt. Follow up readings showed Sinus Rhythm.  I spoke with pt's wife. She is currently at work but reports at that time pt was sleeping.  She gave me pt's number--[phone].  I spoke with pt who reports at 8 AM [place] time he was sleeping.  He reports he is feeling fine at this time.  States he was a little tired after showering this morning but no other complaints.

## 2016-03-10 NOTE — Telephone Encounter (Signed)
Strips reviewed with Norma Fredrickson, NP and pt should continue to wear monitor.  Needs new patient appt with EP after wearing monitor.  I spoke with pt and he would like to proceed with EP referral.  Will have scheduler contact pt with appt time.

## 2016-03-10 NOTE — Telephone Encounter (Signed)
Calling with abnormal EKG Results . Please call

## 2016-03-11 NOTE — Telephone Encounter (Signed)
Patent has OV with Dr. Ladona Ridgel scheduled 11/15.

## 2016-03-12 ENCOUNTER — Telehealth: Payer: Self-pay | Admitting: *Deleted

## 2016-03-12 ENCOUNTER — Telehealth: Payer: Self-pay | Admitting: Internal Medicine

## 2016-03-12 ENCOUNTER — Telehealth: Payer: Self-pay | Admitting: Interventional Cardiology

## 2016-03-12 NOTE — Telephone Encounter (Signed)
Noted  

## 2016-03-12 NOTE — Telephone Encounter (Signed)
I spoke with patient to let him know he is positive for genotype for Dal-Gene Study. I will meet with patient on October 27 at 10:30 to discuss the next steps for Dal-Gene study. Patient verbalized understanding.

## 2016-03-12 NOTE — Telephone Encounter (Signed)
[employer] called and told that patient had a pause of 6 seconds with sinus bradycardia after which patient resumed to sinus bradycardia. Pt was contacted twice but he did not respond. His last transmission was sinus bradycardia. It was communicated that in case patient has more of these episodes to send EMS to the patient, otherwise have patient call in the morning to the office.

## 2016-03-12 NOTE — Telephone Encounter (Signed)
Did not need this encounter °

## 2016-03-12 NOTE — Telephone Encounter (Signed)
Called, spoke with pt. Pt reported He fell asleep around 11:45-12:00 last night. Pt denied having any symptoms during events last night. Pt stated he is not experiencing and symptoms now. Denies dizziness or episodes of passing out. Pt stated he has been feeling weak the past 2 days. Informed I would forward information to DOD to advise. I will call back with recommendations.

## 2016-03-12 NOTE — Telephone Encounter (Signed)
Called, spoke with pt. Informed we would like to move pt's appt with Dr. Ladona Ridgel up to 03/29/16 at 11:30, arriving 15 mins prior. Pt said that would be fine. Advised if pt experiences dizziness or episodes of passing out he needs to go to the emergency department. Asked pt to call our office back with question or concerns. Pt verbalized understand and agreed with plan.

## 2016-03-12 NOTE — Telephone Encounter (Signed)
Called pt. Wife answered. Pt's wife stated [employer] called in the early AM of this morning. Pt's wife said she thinks pt was asleep during events.  Pt's wife gave me cell number for pt. Called pt on cell. No answer. Requested call back to see if pt is having symptoms now and if he had symptoms during events in around 1:00 AM.   Informed I will check with DOD and call back.

## 2016-03-14 ENCOUNTER — Telehealth: Payer: Self-pay | Admitting: Cardiology

## 2016-03-14 NOTE — Telephone Encounter (Signed)
[employer] called and reported the patient had a 4.1 sec pause this afternoon with sinus brady then resumed sinus rhythm. The patient was contacted and informed that he was sleeping during this episode. He was asymptomatic with this. Instructed that if the patient continues to have these episodes or becomes symptomatic needs to come to the ED. I spoke with the patient personally and instructed to come to the ED if he has any weakness, dizziness, light-headed, chest pain or dyspnea. Has appt with Dr. Ladona Ridgel on 10/30.

## 2016-03-15 NOTE — Telephone Encounter (Signed)
Rec'd [employer] transmission reports for holter monitor. Dates: 03/12/16; 03/13/16; 03/14/16; 03/15/16 - vent standstill.   Called, spoke with pt. Informed pt of date/time for each event. Pt stated he was asleep during all 4 events. Pt denied dizziness, CP, or syncope. Advised to call our office with questions of concerns and report to the emergency department if pt experiences dizziness, CP, or syncope. Pt pleasant and verbalized understanding of information.

## 2016-03-16 ENCOUNTER — Telehealth: Payer: Self-pay | Admitting: *Deleted

## 2016-03-16 NOTE — Telephone Encounter (Signed)
Appt with Dr. Ladona Ridgel is 03/23/16 at 10:15

## 2016-03-16 NOTE — Telephone Encounter (Signed)
Dr. Katrinka Blazing would like pt to be seen earlier than 10/30 by EP if availability.  I spoke with pt and appt made for him to see Dr Ladona Ridgel on 10/24 at 10/15.

## 2016-03-21 ENCOUNTER — Telehealth: Payer: Self-pay | Admitting: Internal Medicine

## 2016-03-21 NOTE — Telephone Encounter (Signed)
[employer] called and reported the patient had a  3.4 sec pause tonight while sleeping. He was asymptomatic with this per his wife and sleeping.  Appt w/ Dr. Ladona Ridgel in 2 days

## 2016-03-22 ENCOUNTER — Telehealth: Payer: Self-pay | Admitting: Cardiology

## 2016-03-22 NOTE — Telephone Encounter (Signed)
Rec'd fax from [employer]. Fax note: Only call for pauses 4.0 seconds or greater. Requesting Dr's signature to approve.  Pt has appt with Dr. Ladona Ridgel tomorrow. Forwarded to Dr. Katrinka Blazing to advise.

## 2016-03-22 NOTE — Telephone Encounter (Signed)
[employer] called to notify that patient had a 3.9 second pause.  Patient was sleeping at the time and stated he felt fine

## 2016-03-22 NOTE — Telephone Encounter (Signed)
He is having nocturnal pauses. There appeared to be AV block. He will see Dr. Ladona Ridgel 03/23/16 to consider whether pacing is required. The patient is otherwise doing well.

## 2016-03-22 NOTE — Telephone Encounter (Signed)
Noted. EP evaluation in a.m.

## 2016-03-22 NOTE — Telephone Encounter (Signed)
Rec'd [employer] transmission reports for holter monitor. 03/21/16 AT 12:53 AM; 03/21/16 at 2:41 AM; 03/22/16 AT 3:18 AM. Vent standstill / 2nd Deg Type ll  Called pt, left msg for pt to call us to discuss.

## 2016-03-23 ENCOUNTER — Encounter: Payer: Self-pay | Admitting: Internal Medicine

## 2016-03-23 ENCOUNTER — Ambulatory Visit (INDEPENDENT_AMBULATORY_CARE_PROVIDER_SITE_OTHER): Payer: BLUE CROSS/BLUE SHIELD | Admitting: Internal Medicine

## 2016-03-23 VITALS — BP 122/86 | HR 75 | Ht 68.0 in | Wt 187.8 lb

## 2016-03-23 DIAGNOSIS — I442 Atrioventricular block, complete: Secondary | ICD-10-CM

## 2016-03-23 NOTE — Patient Instructions (Addendum)
Medication Instructions:  Your physician recommends that you continue on your current medications as directed. Please refer to the Current Medication list given to you today.   Labwork: None Ordered   Testing/Procedures: None Ordered .   Follow-Up: Follow-up with Dr. Katrinka Blazing (follow-up with Dr. Ladona Ridgel as needed)    Any Other Special Instructions Will Be Listed Below (If Applicable).     If you need a refill on your cardiac medications before your next appointment, please call your pharmacy.

## 2016-03-23 NOTE — Progress Notes (Signed)
HPI [name] is referred today due to intermittent CHB. He is a pleasant 55 yo man with HTN and CAD, who was admitted several weeks ago with an NSTEMI. The patient was treated with PCI to a large RI stenosis. During his hospital stay, he was noted to be bradycardic and his beta blocker was held. He has been wearing a cardiac monitor which demonstrates nocturnal bradycardia with high grade AV block, typically with 2-4 non-conducted beats. He is asymptomatic. No syncope or near syncope. Allergies  Allergen Reactions  . Lisinopril Swelling     Current Outpatient Prescriptions  Medication Sig Dispense Refill  . amLODipine (NORVASC) 10 MG tablet TAKE 1 TABLET BY MOUTH EVERY DAY 30 tablet 11  . aspirin EC 81 MG EC tablet Take 1 tablet (81 mg total) by mouth daily.    . clopidogrel (PLAVIX) 75 MG tablet Take 1 tablet (75 mg total) by mouth daily with breakfast. 30 tablet 12  . hydrALAZINE (APRESOLINE) 100 MG tablet Take 1 tablet (100 mg total) by mouth 3 (three) times daily. 90 tablet 12  . irbesartan (AVAPRO) 300 MG tablet Take 1 tablet (300 mg total) by mouth daily. 30 tablet 12  . isosorbide mononitrate (IMDUR) 30 MG 24 hr tablet Take 1 tablet (30 mg total) by mouth daily. 30 tablet 11  . nitroGLYCERIN (NITROSTAT) 0.4 MG SL tablet Place 1 tablet (0.4 mg total) under the tongue every 5 (five) minutes as needed for chest pain. 25 tablet 1  . rosuvastatin (CRESTOR) 40 MG tablet Take 1 tablet (40 mg total) by mouth daily. 90 tablet 3  . thiamine (VITAMIN B-1) 100 MG tablet Take 1 tablet (100 mg total) by mouth daily. 90 tablet 3  . vortioxetine HBr (TRINTELLIX) 10 MG TABS Take 1 tablet (10 mg total) by mouth daily. 28 tablet 0   No current facility-administered medications for this visit.      Past Medical History:  Diagnosis Date  . Apical variant hypertrophic cardiomyopathy (HCC)   . CAD (coronary artery disease)    a. LHC in 2011 with severe dz in D1/D2 and very distal LAD-->  too small for PCI and managed medically. b.  LHC in 10/2013 after an abnormal nuclear study w/ non-obst dz/c. 01/2016 NSTEMI 2.5 x 38 mm Promus DES to Ramus    . Chronic back pain   . 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  [name] Kitchen HOH (hard of hearing)    rgiht ear  . Hyperlipidemia   . Hypertension   . Migraine headache   . Stroke (HCC)     ROS:   All systems reviewed and negative except as noted in the HPI.   Past Surgical History:  Procedure Laterality Date  . CARDIAC CATHETERIZATION N/A 02/24/2016   Procedure: Left Heart Cath and Coronary Angiography;  Surgeon: [name] Bollman, MD;  Location: [facility] INVASIVE CV LAB;  Service: Cardiovascular;  Laterality: N/A;  . CARDIAC CATHETERIZATION N/A 02/24/2016   Procedure: Coronary Stent Intervention;  Surgeon: [name] Bollman, MD;  Location: [facility] INVASIVE CV LAB;  Service: Cardiovascular;  Laterality: N/A;  . INGUINAL HERNIA REPAIR    . LEFT HEART CATHETERIZATION WITH CORONARY ANGIOGRAM N/A 11/22/2013   Procedure: LEFT HEART CATHETERIZATION  WITH CORONARY ANGIOGRAM;  Surgeon: [name] NoeHenry W Smith III, MD;  [facility];  Service: Cardiovascular;  Laterality: N/A;  . TEE WITHOUT CARDIOVERSION N/A 08/22/2015   Procedure: TRANSESOPHAGEAL ECHOCARDIOGRAM (TEE);  Surgeon: [name] MixerPhilip J Nahser, MD;  [facility];  Service: Cardiovascular;  Laterality: N/A;     Family History  Problem Relation Age of Onset  . Hypertension Mother   . Heart failure Mother   . Hypertension Father   . Colon cancer Father 566  . Colon cancer Paternal Uncle      Social History   Social History  . Marital status: Married    Spouse name: N/A  . Number of children: N/A  . Years of education: N/A   Occupational  History  . Not on file.   Social History Main Topics  . Smoking status: Current Some Day Smoker    Packs/day: 0.00    Types: Cigarettes  . Smokeless tobacco: Never Used  . Alcohol use No  . Drug use: No  . Sexual activity: Not on file   Other Topics Concern  . Not on file   Social History Narrative  . No narrative on file     BP 122/86   Pulse 75   Ht 5\' 8"  (1.727 m)   Wt 187 lb 12.8 oz (85.2 kg)   BMI 28.55 kg/m   Physical Exam:  Well appearing NAD HEENT: Unremarkable Neck:  No JVD, no thyromegally Lymphatics:  No adenopathy Back:  No CVA tenderness Lungs:  Clear with no wheezes HEART:  Regular rate rhythm, no murmurs, no rubs, no clicks Abd:  soft, positive bowel sounds, no organomegally, no rebound, no guarding Ext:  2 plus pulses, no edema, no cyanosis, no clubbing Skin:  No rashes no nodules Neuro:  CN II through XII intact, motor grossly intact   Heart monitor - nsr with nocturnal pauses  Assess/Plan: 1. CAD - he is s/p NSTEMI with preserved LV function - he will continue his current meds. I have asked him to stop smoking. 2. HTN - his blood pressure is well controlled today. He will continue his current meds. 3. Tobacco abuse - smoking cessation is recommended 4. Heart block - as his heart block occurs when he is asleep, no indication for PPM. He is going to hold off on any AV nodal blocking drugs for now.  Leonia ReevesGregg Lisandra English,M.D.

## 2016-03-24 ENCOUNTER — Telehealth: Payer: Self-pay | Admitting: Interventional Cardiology

## 2016-03-24 NOTE — Telephone Encounter (Signed)
Received critical monitor tracing from [employer].  Pt had a 5.2 sec ventricular standstill this morning at 0619.  Tracing shows that Suzzette Righter, NP was notified of this.   Pt did convert back to NSR. Pt was seen by Dr. Ladona Ridgel yesterday and said no indication for PPM.  Will forward to Dr. Katrinka Blazing to make him aware.

## 2016-03-24 NOTE — Telephone Encounter (Signed)
Dr. Katrinka Blazing in office and said ok for pt to d/c monitor.  Spoke with pt and informed him ok to take monitor off and send back in.  Pt verbalized understanding and was in agreement with plan.

## 2016-03-29 ENCOUNTER — Institutional Professional Consult (permissible substitution): Payer: BLUE CROSS/BLUE SHIELD | Admitting: Internal Medicine

## 2016-03-30 NOTE — Progress Notes (Signed)
CARDIOLOGY OFFICE NOTE  Date:  03/31/2016    [name] Date of Birth: 05-27-1961 Medical Record #[id number]  PCP:  Sanda Linger, MD  Cardiologist:  Tyrone [facility]age [facility]  Chief Complaint  Patient presents with  . Coronary Artery Disease    Follow up visit - seen for Dr. Katrinka Blazing    History of Present Illness: [name] is a 55 y.o. male who presents today for a follow up visit. Seen for Dr. Katrinka Blazing & Ladona Ridgel.   He has a history of HTN, prior CVA, hypertensive heart disease with LVH, and nonobstructive CAD. Noted hypertrophic CM in the past.   Hepresented to ER on 9/26/17with chest pain. TnI was 0.15. Pain decreased with NTG. ECG was abnormal with inferior and anterolateral T wave changes but was similar to prior. He was admitted and left heart catheterization was planned. He had critical stenosis of his ramus intermedius branch that was successfully treated with DES. He will be on DAPT with Plavix and ASA. Brilinta was avoided because he was bradycardic. He was to have a 30 day event monitor placed. He is on high dose intensity statin.   There was some mention of apical hypertrophic cardiomyopathy, but he recently had a TEE in March of this year that did NOT mention it.   I saw him back a couple of times since his initial discharge. Still having some chest pain. Got him on Imdur and his chest pain improved. Got his monitor placed. Has been referred to EP for intermittent CHB - nocturnal - no indication for PPM according to Dr. Ladona Ridgel - last seen a week ago by Dr. Ladona Ridgel.   Comes in today. Here     Past Medical History:  Diagnosis Date  . Apical variant hypertrophic cardiomyopathy (HCC)   . CAD (coronary artery disease)    a. LHC in 2011 with severe dz in D1/D2 and very distal LAD--> too small for PCI and managed medically. b.  LHC in 10/2013 after an abnormal nuclear study w/ non-obst dz/c. 01/2016 NSTEMI 2.5 x 38 mm Promus DES to Ramus    . Chronic back  pain   . 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  Marland Kitchen HOH (hard of hearing)    rgiht ear  . Hyperlipidemia   . Hypertension   . Migraine headache   . Stroke [facility])     Past Surgical History:  Procedure Laterality Date  . CARDIAC CATHETERIZATION N/A 02/24/2016   Procedure: Left Heart Cath and Coronary Angiography;  Surgeon: Tonny Bollman, MD;  Location: [facility] INVASIVE CV LAB;  Service: Cardiovascular;  Laterality: N/A;  . CARDIAC CATHETERIZATION N/A 02/24/2016   Procedure: Coronary Stent Intervention;  Surgeon: Tonny Bollman, MD;  Location: [facility] INVASIVE CV LAB;  Service: Cardiovascular;  Laterality: N/A;  . INGUINAL HERNIA REPAIR    . LEFT HEART CATHETERIZATION WITH CORONARY ANGIOGRAM N/A 11/22/2013   Procedure: LEFT HEART CATHETERIZATION WITH CORONARY ANGIOGRAM;  Surgeon: [name], MD;  Location[facility] CATH LAB;  Service: Cardiovascular;  Laterality: N/A;  . TEE WITHOUT CARDIOVERSION N/A 08/22/2015   Procedure: TRANSESOPHAGEAL ECHOCARDIOGRAM (TEE);  Surgeon: Vesta Mixer, MD;  Location: [facility] ENDOSCOPY;  Service: Cardiovascular;  Laterality: N/A;     Medications:  Current Outpatient Prescriptions  Medication Sig Dispense Refill  . amLODipine (NORVASC) 10 MG tablet TAKE 1 TABLET BY MOUTH EVERY DAY 30 tablet 11  . aspirin EC 81 MG EC tablet Take 1 tablet (81 mg total) by mouth daily.    . clopidogrel (PLAVIX) 75 MG tablet Take 1 tablet (75 mg total) by mouth daily with breakfast. 30 tablet 12  . hydrALAZINE (APRESOLINE) 100 MG tablet Take 1 tablet (100 mg total) by mouth 3 (three) times daily. 90 tablet 12  . irbesartan (AVAPRO) 300 MG tablet Take 1 tablet (300 mg total) by mouth daily. 30 tablet 12  . isosorbide mononitrate (IMDUR)  30 MG 24 hr tablet Take 1 tablet (30 mg total) by mouth daily. 30 tablet 11  . nitroGLYCERIN (NITROSTAT) 0.4 MG SL tablet Place 1 tablet (0.4 mg total) under the tongue every 5 (five) minutes as needed for chest pain. 25 tablet 1  . rosuvastatin (CRESTOR) 40 MG tablet Take 1 tablet (40 mg total) by mouth daily. 90 tablet 3  . thiamine (VITAMIN B-1) 100 MG tablet Take 1 tablet (100 mg total) by mouth daily. 90 tablet 3  . vortioxetine HBr (TRINTELLIX) 10 MG TABS Take 1 tablet (10 mg total) by mouth daily. 28 tablet 0   No current facility-administered medications for this visit.     Allergies: Allergies  Allergen Reactions  . Lisinopril Swelling    Social History: The patient  reports that he has been smoking Cigarettes.  He has been smoking about 0.00 packs per day. He has never used smokeless tobacco. He reports that he does not drink alcohol or use drugs.   Family History: The patient's family history includes Colon cancer in his paternal uncle; Colon cancer (age of onset: 48) in his father; Heart failure in his mother; Hypertension in his father and mother.   Review of Systems: Please see the history of present illness.   Otherwise, the review of systems is positive for none.   All other systems are reviewed and negative.   Physical Exam: VS:  There were no vitals taken for this visit. Marland Kitchen  BMI There is no height or weight on file to calculate BMI.  Wt Readings from Last 3 Encounters:  03/23/16 187 lb 12.8 oz (85.2 kg)  03/09/16 191 lb 12.8 oz (87 kg)  03/03/16 187 lb (84.8 kg)    General: Pleasant. Well developed, well nourished and in no acute distress.   HEENT: Normal.  Neck: Supple, no JVD, carotid bruits, or masses noted.  Cardiac: Regular rate and rhythm. No murmurs, rubs, or gallops. No edema.  Respiratory:  Lungs are clear to auscultation bilaterally with normal work of breathing.  GI: Soft and nontender.  MS: No deformity or atrophy. Gait and ROM intact.  Skin: Warm  and dry. Color is normal.  Neuro:  Strength and sensation are intact and no gross focal deficits noted.  Psych: Alert, appropriate and with normal affect.   LABORATORY DATA:  EKG:  EKG is not ordered today.  Lab Results  Component Value Date   WBC 8.2 03/09/2016   HGB 12.7 (L) 03/09/2016   HCT 37.4 (L) 03/09/2016   PLT 332 03/09/2016   GLUCOSE 83 03/09/2016   CHOL 174 02/25/2016   TRIG 82 02/25/2016   HDL 44 02/25/2016   LDLCALC 114 (H) 02/25/2016   ALT 12 01/06/2016   AST 14 01/06/2016   NA 140 03/09/2016   K 4.0 03/09/2016   CL 105 03/09/2016  CREATININE 1.33 03/09/2016   BUN 15 03/09/2016   CO2 25 03/09/2016   TSH 2.409 02/24/2016   PSA 1.77 08/27/2014   INR 1.06 02/24/2016   HGBA1C  09/17/2009    5.1 (NOTE)                                                                       According to the ADA Clinical Practice Recommendations for 2011, when HbA1c is used as a screening test:   >=6.5%   Diagnostic of Diabetes Mellitus           (if abnormal result  is confirmed)  5.7-6.4%   Increased risk of developing Diabetes Mellitus  References:Diagnosis and Classification of Diabetes Mellitus,Diabetes Care,2011,34(Suppl 1):S62-S69 and Standards of Medical Care in         Diabetes - 2011,Diabetes Care,2011,34  (Suppl 1):S11-S61.    BNP (last 3 results) No results for input(s): BNP in the last 8760 hours.  ProBNP (last 3 results) No results for input(s): PROBNP in the last 8760 hours.   Other Studies Reviewed Today:  Coronary Stent Intervention - [place]  Left Heart Cath and Coronary Angiography 02/24/16   1. Critical stenosis of a large ramus intermedius branch treated successfully with PCI using a long drug-eluting stent 2. Mild diffuse proximal and mid LAD stenosis with severe apical stenosis of the LAD and total occlusion of the small first diagonal subbranch supplied by left to left collaterals 3. Large, dominant RCA with minimal irregularity and nonobstructive  stenosis of a large posterolateral branch 4. Moderate stenosis of the mid left circumflex and severe OM subbranch stenosis  Recommendations: The patient was loaded with Effient 60 mg on the Cath Lab table. I did not realize his history of stroke at the time. With this history, he cannot take effient as a long-term medication. Will transition him to plavix tomorrow morning. Brilinta should be avoided because of bradyarrhythmias.     TEE Study Conclusions from 07/2015  - Left ventricle: The cavity size was normal. Wall thickness was increased in a pattern of moderate LVH. Systolic function was normal. - Aortic valve: No evidence of vegetation. - Mitral valve: No evidence of vegetation. - Left atrium: No evidence of thrombus in the atrial cavity or appendage. - Atrial septum: No defect or patent foramen ovale was identified.   Assessment/Plan:  1. Recent NSTEMI with PCI to the ramus intermediate (2.5 x 38) with known residual disease - - There was an equal size subbranch of the ramus that wasjailed from the stent, but there was notedTIMI 3 flow and the patient was initially chest pain-free at the completion of the procedure. The vessel was diffusely diseased beyond the bifurcation point and required a long stent.   He has had recurrent chest pain since his PCI - this has improved with the addition of Imdur. EKG remains unchanged. Reviewed last several EKGs with Dr. Katrinka BlazingSmith - felt to also be impacted by LVH. Will need to optimize medicines and try to get his symptoms stable. It is not surprising to Dr. Katrinka BlazingSmith that he has had chest pain upon review of his cath films. Will leave him on his current dose of Imdur for now - but could certainly increase the dose or consider  addition of Ranexa.  2. HTN - BP ok on current regimen. He has not had his medicines yet today.   3. HLD - on statin therapy.   4. Bradycardia - subsequent intermittent CHB noted - has seen Dr. Ladona Ridgel - episodes  were all nocturnal - no indication for PPM at this time. Reminded to make [place] aware of any dizzy/passing out spells.   5. Tobacco abuse - needs total cessation   Current medicines are reviewed with the patient today.  The patient does not have concerns regarding medicines other than what has been noted above.  The following changes have been made:  See above.  Labs/ tests ordered today include:   No orders of the defined types were placed in this encounter.    Disposition:   FU with Dr. Katrinka Blazing in   Patient is agreeable to this plan and will call if any problems develop in the interim.   Signed: Rosalio Macadamia, RN, ANP-C 03/31/2016 9:55 AM  [facility] [address] [place], [place]  [address] Phone: [phone] Fax: [phone]         This encounter was created in error - please disregard.

## 2016-03-31 ENCOUNTER — Encounter: Payer: Self-pay | Admitting: Nurse Practitioner

## 2016-03-31 ENCOUNTER — Encounter: Payer: BLUE CROSS/BLUE SHIELD | Admitting: Nurse Practitioner

## 2016-03-31 NOTE — Patient Instructions (Addendum)
We will be checking the following labs today -    Medication Instructions:    Continue with your current medicines.     Testing/Procedures To Be Arranged:  N/A  Follow-Up:   See Dr. Katrinka Blazing in December as planned    Other Special Instructions:   N/A    If you need a refill on your cardiac medications before your next appointment, please call your pharmacy.   Call the [facility] office at [phone] if you have any questions, problems or concerns.

## 2016-03-31 NOTE — Progress Notes (Signed)
Patient did not wish to be seen today. Has no symptoms. No chest pain. Stopped his Imdur.   Seeing Dr. Katrinka Blazing next month.   Rosalio Macadamia, RN, ANP-C [facility] [address] [place], [place]  [address] [phone]

## 2016-04-14 ENCOUNTER — Institutional Professional Consult (permissible substitution): Payer: BLUE CROSS/BLUE SHIELD | Admitting: Internal Medicine

## 2016-05-12 ENCOUNTER — Ambulatory Visit (INDEPENDENT_AMBULATORY_CARE_PROVIDER_SITE_OTHER): Payer: BLUE CROSS/BLUE SHIELD | Admitting: Interventional Cardiology

## 2016-05-12 ENCOUNTER — Encounter: Payer: Self-pay | Admitting: Interventional Cardiology

## 2016-05-12 VITALS — BP 152/84 | HR 76 | Ht 68.0 in | Wt 192.2 lb

## 2016-05-12 DIAGNOSIS — I4430 Unspecified atrioventricular block: Secondary | ICD-10-CM

## 2016-05-12 DIAGNOSIS — I2510 Atherosclerotic heart disease of native coronary artery without angina pectoris: Secondary | ICD-10-CM | POA: Diagnosis not present

## 2016-05-12 DIAGNOSIS — N182 Chronic kidney disease, stage 2 (mild): Secondary | ICD-10-CM

## 2016-05-12 DIAGNOSIS — R001 Bradycardia, unspecified: Secondary | ICD-10-CM

## 2016-05-12 DIAGNOSIS — I25110 Atherosclerotic heart disease of native coronary artery with unstable angina pectoris: Secondary | ICD-10-CM

## 2016-05-12 DIAGNOSIS — E785 Hyperlipidemia, unspecified: Secondary | ICD-10-CM

## 2016-05-12 DIAGNOSIS — I10 Essential (primary) hypertension: Secondary | ICD-10-CM

## 2016-05-12 DIAGNOSIS — I422 Other hypertrophic cardiomyopathy: Secondary | ICD-10-CM

## 2016-05-12 DIAGNOSIS — F17200 Nicotine dependence, unspecified, uncomplicated: Secondary | ICD-10-CM

## 2016-05-12 DIAGNOSIS — Z9861 Coronary angioplasty status: Secondary | ICD-10-CM

## 2016-05-12 NOTE — Progress Notes (Signed)
Cardiology Office Note    Date:  05/12/2016   ID:  [name], DOB July 23, 1960, MRN [id number]  PCP:  Sanda Linger, MD  Cardiologist: [name], MD   Chief Complaint  Patient presents with  . Coronary Artery Disease    History of Present Illness:  [name] is a 55 y.o. male with HTN and CAD, who was admitted several weeks ago with an NSTEMI. The patient was treated with PCI to a large RI stenosis. During his hospital stay, he was noted to be bradycardic and his beta blocker was held.During the hospital stay he was noted to have bradycardia and continuous monitoring demonstrated high-grade AV block during sleep with greater than 5 second pauses. He was seen by Dr. Gilman Schmidt who felt that conservative management was the appropriate approach.  Patient is doing well. He denies chest pain. He is not able to do rule heavy physical activity without developing angina. We discussed his residual disease noted on recent cath after ramus intermedius stenting during non-ST elevation MI. He has done well since that time. He has had no bleeding on dual antiplatelet therapy. Despite Crestor 40 mg per day as LDL is 80.   Past Medical History:  Diagnosis Date  . Apical variant hypertrophic cardiomyopathy (HCC)   . CAD (coronary artery disease)    a. LHC in 2011 with severe dz in D1/D2 and very distal LAD--> too small for PCI and managed medically. b.  LHC in 10/2013 after an abnormal nuclear study w/ non-obst dz/c. 01/2016 NSTEMI 2.5 x 38 mm Promus DES to Ramus    . Chronic back pain   . 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  Marland Kitchen HOH (hard of hearing)    rgiht ear  . Hyperlipidemia   . Hypertension   . Migraine headache    . Stroke [facility])     Past Surgical History:  Procedure Laterality Date  . CARDIAC CATHETERIZATION N/A 02/24/2016   Procedure: Left Heart Cath and Coronary Angiography;  Surgeon: Tonny Bollman, MD;  Location: [facility] INVASIVE CV LAB;  Service: Cardiovascular;  Laterality: N/A;  . CARDIAC CATHETERIZATION N/A 02/24/2016   Procedure: Coronary Stent Intervention;  Surgeon: Tonny Bollman, MD;  Location: [facility] INVASIVE CV LAB;  Service: Cardiovascular;  Laterality: N/A;  . INGUINAL HERNIA REPAIR    . LEFT HEART CATHETERIZATION WITH CORONARY ANGIOGRAM N/A 11/22/2013   Procedure: LEFT HEART CATHETERIZATION WITH CORONARY ANGIOGRAM;  Surgeon: [name], MD;  Location: [facility] CATH LAB;  Service: Cardiovascular;  Laterality: N/A;  . TEE WITHOUT CARDIOVERSION N/A 08/22/2015   Procedure: TRANSESOPHAGEAL ECHOCARDIOGRAM (TEE);  Surgeon: Vesta Mixer, MD;  Location: [facility] ENDOSCOPY;  Service: Cardiovascular;  Laterality: N/A;    Current Medications: Outpatient Medications Prior to Visit  Medication Sig Dispense Refill  . amLODipine (NORVASC) 10 MG tablet TAKE 1 TABLET BY MOUTH EVERY DAY 30 tablet 11  . aspirin EC 81 MG EC tablet Take 1 tablet (81 mg total) by mouth daily.    . clopidogrel (PLAVIX) 75 MG tablet Take 1 tablet (75 mg total) by mouth daily with breakfast. 30 tablet 12  . hydrALAZINE (APRESOLINE) 100 MG tablet Take 1 tablet (100 mg total) by mouth 3 (three) times  daily. 90 tablet 12  . irbesartan (AVAPRO) 300 MG tablet Take 1 tablet (300 mg total) by mouth daily. 30 tablet 12  . isosorbide mononitrate (IMDUR) 30 MG 24 hr tablet Take 1 tablet (30 mg total) by mouth daily. 30 tablet 11  . nitroGLYCERIN (NITROSTAT) 0.4 MG SL tablet Place 1 tablet (0.4 mg total) under the tongue every 5 (five) minutes as needed for chest pain. 25 tablet 1  . rosuvastatin (CRESTOR) 40 MG tablet Take 1 tablet (40 mg total) by mouth daily. 90 tablet 3  . thiamine (VITAMIN B-1) 100 MG tablet Take 1 tablet (100 mg total) by  mouth daily. 90 tablet 3  . vortioxetine HBr (TRINTELLIX) 10 MG TABS Take 1 tablet (10 mg total) by mouth daily. (Patient not taking: Reported on 05/12/2016) 28 tablet 0   No facility-administered medications prior to visit.      Allergies:   Lisinopril   Social History   Social History  . Marital status: Married    Spouse name: N/A  . Number of children: N/A  . Years of education: N/A   Social History Main Topics  . Smoking status: Current Some Day Smoker    Packs/day: 0.00    Types: Cigarettes  . Smokeless tobacco: Never Used  . Alcohol use No  . Drug use: No  . Sexual activity: Not Asked   Other Topics Concern  . None   Social History Narrative  . None     Family History:  The patient's family history includes Colon cancer in his paternal uncle; Colon cancer (age of onset: 2666) in his father; Heart failure in his mother; Hypertension in his father and mother.   ROS:   Please see the history of present illness.    He has no other specific complaints.  All other systems reviewed and are negative.   PHYSICAL EXAM:   VS:  BP (!) 152/84 (BP Location: Left Arm)   Pulse 76   Ht 5\' 8"  (1.727 m)   Wt 192 lb 3.2 oz (87.2 kg)   BMI 29.22 kg/m    GEN: Well nourished, well developed, in no acute distress  HEENT: normal  Neck: no JVD, carotid bruits, or masses Cardiac: RRR; no murmurs, rubs, or gallops,no edema  Respiratory:  clear to auscultation bilaterally, normal work of breathing GI: soft, nontender, nondistended, + BS MS: no deformity or atrophy  Skin: warm and dry, no rash Neuro:  Alert and Oriented x 3, Strength and sensation are intact Psych: euthymic mood, full affect  Wt Readings from Last 3 Encounters:  05/12/16 192 lb 3.2 oz (87.2 kg)  03/23/16 187 lb 12.8 oz (85.2 kg)  03/09/16 191 lb 12.8 oz (87 kg)      Studies/Labs Reviewed:   EKG:  EKG  No new data  Recent Labs: 01/06/2016: ALT 12 02/24/2016: TSH 2.409 03/09/2016: BUN 15; Creat 1.33;  Hemoglobin 12.7; Platelets 332; Potassium 4.0; Sodium 140   Lipid Panel    Component Value Date/Time   CHOL 174 02/25/2016 0341   TRIG 82 02/25/2016 0341   HDL 44 02/25/2016 0341   CHOLHDL 4.0 02/25/2016 0341   VLDL 16 02/25/2016 0341   LDLCALC 114 (H) 02/25/2016 0341    Additional studies/ records that were reviewed today include:  No new data    ASSESSMENT:    1. CAD S/P PCI- Ramus DES 2.5 x 38 mm    2. Apical variant hypertrophic cardiomyopathy (HCC)   3. AV block   4.  Essential hypertension   5. Hyperlipidemia with target LDL less than 70   6. Chronic renal insufficiency, stage II (mild)   7. Coronary artery disease involving native coronary artery of native heart with unstable angina pectoris (HCC)   8. TOBACCO USE   9. Bradycardia      PLAN:  In order of problems listed above:  1. No recurrence of angina since stenting. 2. Not addressed 3. Occurs during sleep and is not felt to be clinically relevant. 4. 2 g sodium diet 5. Consider the Orion 10 trial. Will speak with Victorino Dike at research. He has initially refused the study while in the hospital but after discussion today he is interested in potentially enrolling.    Medication Adjustments/Labs and Tests Ordered: Current medicines are reviewed at length with the patient today.  Concerns regarding medicines are outlined above.  Medication changes, Labs and Tests ordered today are listed in the Patient Instructions below. Patient Instructions  Medication Instructions:  None  Labwork: None  Testing/Procedures: None  Follow-Up: Your physician wants you to follow-up in: 6 months with Dr. Katrinka Blazing.  You will receive a reminder letter in the mail two months in advance. If you don't receive a letter, please call our office to schedule the follow-up appointment.   Any Other Special Instructions Will Be Listed Below (If Applicable).     If you need a refill on your cardiac medications before your next  appointment, please call your pharmacy.      Signed, [name], MD  05/12/2016 11:20 AM    [facility] [address] [place], [place], [place]  [address] Phone: [phone]; Fax: [phone]

## 2016-05-12 NOTE — Patient Instructions (Signed)
Medication Instructions:  None  Labwork: None  Testing/Procedures: None  Follow-Up: Your physician wants you to follow-up in: 6 months with Dr. Smith.  You will receive a reminder letter in the mail two months in advance. If you don't receive a letter, please call our office to schedule the follow-up appointment.   Any Other Special Instructions Will Be Listed Below (If Applicable).     If you need a refill on your cardiac medications before your next appointment, please call your pharmacy.   

## 2016-07-08 ENCOUNTER — Other Ambulatory Visit: Payer: BLUE CROSS/BLUE SHIELD

## 2016-07-15 ENCOUNTER — Encounter (HOSPITAL_COMMUNITY): Payer: Self-pay | Admitting: Internal Medicine

## 2016-07-15 ENCOUNTER — Telehealth (HOSPITAL_COMMUNITY): Payer: Self-pay | Admitting: Internal Medicine

## 2016-07-15 NOTE — Telephone Encounter (Signed)
Mailed letter with [facility] along with my chart message... KJ  °

## 2016-07-21 ENCOUNTER — Encounter (HOSPITAL_COMMUNITY): Payer: Self-pay

## 2016-07-21 DIAGNOSIS — I2510 Atherosclerotic heart disease of native coronary artery without angina pectoris: Secondary | ICD-10-CM | POA: Diagnosis not present

## 2016-07-21 DIAGNOSIS — F17210 Nicotine dependence, cigarettes, uncomplicated: Secondary | ICD-10-CM | POA: Diagnosis not present

## 2016-07-21 DIAGNOSIS — I10 Essential (primary) hypertension: Secondary | ICD-10-CM | POA: Insufficient documentation

## 2016-07-21 DIAGNOSIS — Z7982 Long term (current) use of aspirin: Secondary | ICD-10-CM | POA: Insufficient documentation

## 2016-07-21 DIAGNOSIS — Z8673 Personal history of transient ischemic attack (TIA), and cerebral infarction without residual deficits: Secondary | ICD-10-CM | POA: Diagnosis not present

## 2016-07-21 DIAGNOSIS — R05 Cough: Secondary | ICD-10-CM | POA: Insufficient documentation

## 2016-07-21 DIAGNOSIS — R509 Fever, unspecified: Secondary | ICD-10-CM | POA: Insufficient documentation

## 2016-07-21 NOTE — ED Triage Notes (Signed)
Pt endorses dry cough, bodyache and fever x 2 days. VSS. Oral temp 99.8 in triage. Breath sounds clear.

## 2016-07-22 ENCOUNTER — Emergency Department (HOSPITAL_COMMUNITY): Payer: BLUE CROSS/BLUE SHIELD

## 2016-07-22 ENCOUNTER — Emergency Department (HOSPITAL_COMMUNITY)
Admission: EM | Admit: 2016-07-22 | Discharge: 2016-07-22 | Disposition: A | Discharge status: Home / Self Care | Payer: BLUE CROSS/BLUE SHIELD | Attending: Emergency Medicine | Admitting: Emergency Medicine

## 2016-07-22 DIAGNOSIS — R6889 Other general symptoms and signs: Secondary | ICD-10-CM

## 2016-07-22 MED ORDER — HYDROCODONE-HOMATROPINE 5-1.5 MG/5ML PO SYRP: 5.0000 mL | ORAL_SOLUTION | Freq: Four times a day (QID) | ORAL | 0 refills | Status: DC | PRN

## 2016-07-22 MED ORDER — OSELTAMIVIR PHOSPHATE 75 MG PO CAPS
75.0000 mg | ORAL_CAPSULE | Freq: Once | ORAL | Status: AC
  Administered 2016-07-22: 75 mg via ORAL
  Filled 2016-07-22: qty 1

## 2016-07-22 MED ORDER — OSELTAMIVIR PHOSPHATE 75 MG PO CAPS: 75.0000 mg | ORAL_CAPSULE | Freq: Two times a day (BID) | ORAL | 0 refills | Status: DC

## 2016-07-22 MED ORDER — BENZONATATE 100 MG PO CAPS: 100.0000 mg | ORAL_CAPSULE | Freq: Three times a day (TID) | ORAL | 0 refills | Status: DC

## 2016-07-22 NOTE — ED Notes (Signed)
Pt verbalized understanding of d/c instructions and has no further questions. VSS A/O ambulatory

## 2016-07-22 NOTE — ED Provider Notes (Signed)
[facility]-EMERGENCY DEPT Provider Note   CSN: [id number] Arrival date & time: 07/21/16  2300  By signing my name below, I, Levon Hedger, attest that this documentation has been prepared under the direction and in the presence of Gilda Crease, MD . Electronically Signed: Levon Hedger, Scribe. 07/22/2016. 1:14 AM.   History   Chief Complaint Chief Complaint  Patient presents with  . Influenza    HPI [name] is a 56 y.o. male with a history of CAD, HTN and stroke who presents to the Emergency Department complaining of constant, moderate generalized body aches onset two days ago. He notes associated nonproductive cough, congestion and fever. No alleviating or modifying factors noted.  No prior treatments indicated. He denies any history of asthma.   The history is provided by the patient. No language interpreter was used.   Past Medical History:  Diagnosis Date  . Apical variant hypertrophic cardiomyopathy (HCC)   . CAD (coronary artery disease)    a. LHC in 2011 with severe dz in D1/D2 and very distal LAD--> too small for PCI and managed medically. b.  LHC in 10/2013 after an abnormal nuclear study w/ non-obst dz/c. 01/2016 NSTEMI 2.5 x 38 mm Promus DES to Ramus    . Chronic back pain   . 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  Marland Kitchen HOH (hard of hearing)    rgiht ear  . Hyperlipidemia   . Hypertension   . Migraine headache   . Stroke [facility])     Patient Active Problem List   Diagnosis Date Noted  . Bradycardia   . AV block   . Thiamine deficiency 11/19/2015  . Cephalalgia 11/19/2015  . Abnormal brain MRI 11/19/2015  . 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 Chronic renal insufficiency, stage II (mild)  07/02/2014  . Coronary artery disease involving native coronary artery of native heart with unstable angina pectoris (HCC)   . 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/2013  . Drug noncompliance 03/26/2013  . Migraine, unspecified, without mention of intractable migraine without mention of status migrainosus 03/26/2013  . GERD (gastroesophageal reflux disease) 11/14/2012  . Routine general medical examination at a health care facility 07/28/2012  . ERECTILE DYSFUNCTION 08/12/2010  . Hyperlipidemia with target LDL less than 70 10/15/2009  . TOBACCO USE 10/15/2009  . Essential hypertension 10/15/2009    Past Surgical History:  Procedure Laterality Date  . CARDIAC CATHETERIZATION N/A 02/24/2016   Procedure: Left Heart Cath and Coronary Angiography;  Surgeon: Tonny Bollman, MD;  Location: [facility] INVASIVE CV LAB;  Service: Cardiovascular;  Laterality: N/A;  . CARDIAC CATHETERIZATION N/A 02/24/2016   Procedure: Coronary Stent Intervention;  Surgeon: Tonny Bollman, MD;  Location: [facility] INVASIVE CV LAB;  Service: Cardiovascular;  Laterality: N/A;  . INGUINAL HERNIA REPAIR    . LEFT HEART CATHETERIZATION WITH CORONARY ANGIOGRAM N/A 11/22/2013   Procedure: LEFT HEART CATHETERIZATION WITH CORONARY ANGIOGRAM;  Surgeon: [name], MD;  Location: [facility] CATH LAB;  Service: Cardiovascular;  Laterality: N/A;  . TEE WITHOUT CARDIOVERSION N/A 08/22/2015   Procedure: TRANSESOPHAGEAL ECHOCARDIOGRAM (TEE);  Surgeon: Vesta Mixer, MD;  Location: [facility] ENDOSCOPY;  Service: Cardiovascular;  Laterality: N/A;       Home Medications    Prior  to Admission medications   Medication Sig Start Date End Date Taking? Authorizing Provider  amLODipine (NORVASC) 10 MG tablet TAKE 1 TABLET BY MOUTH EVERY DAY 08/08/15   Lyn Records, MD  aspirin EC 81 MG EC tablet Take 1 tablet (81 mg total) by mouth daily. 02/25/16   Little Ishikawa, NP  benzonatate (TESSALON) 100 MG capsule Take 1 capsule (100 mg total) by mouth  every 8 (eight) hours. 07/22/16   Gilda Crease, MD  clopidogrel (PLAVIX) 75 MG tablet Take 1 tablet (75 mg total) by mouth daily with breakfast. 02/26/16   Little Ishikawa, NP  hydrALAZINE (APRESOLINE) 100 MG tablet Take 1 tablet (100 mg total) by mouth 3 (three) times daily. 02/25/16   Little Ishikawa, NP  HYDROcodone-homatropine (HYCODAN) 5-1.5 MG/5ML syrup Take 5 mLs by mouth every 6 (six) hours as needed for cough. 07/22/16   Gilda Crease, MD  irbesartan (AVAPRO) 300 MG tablet Take 1 tablet (300 mg total) by mouth daily. 02/26/16   Little Ishikawa, NP  isosorbide mononitrate (IMDUR) 30 MG 24 hr tablet Take 1 tablet (30 mg total) by mouth daily. 03/04/16 06/02/16  Rosalio Macadamia, NP  nitroGLYCERIN (NITROSTAT) 0.4 MG SL tablet Place 1 tablet (0.4 mg total) under the tongue every 5 (five) minutes as needed for chest pain. 02/12/16 05/12/16  Lyn Records, MD  oseltamivir (TAMIFLU) 75 MG capsule Take 1 capsule (75 mg total) by mouth every 12 (twelve) hours. 07/22/16   Gilda Crease, MD  rosuvastatin (CRESTOR) 40 MG tablet Take 1 tablet (40 mg total) by mouth daily. 10/06/15   Beatrice Lecher, PA-C  thiamine (VITAMIN B-1) 100 MG tablet Take 1 tablet (100 mg total) by mouth daily. 11/24/15   Etta Grandchild, MD    Family History Family History  Problem Relation Age of Onset  . Hypertension Mother   . Heart failure Mother   . Hypertension Father   . Colon cancer Father 105  . Colon cancer Paternal Uncle     Social History Social History  Substance Use Topics  . Smoking status: Current Some Day Smoker    Packs/day: 0.00    Types: Cigarettes  . Smokeless tobacco: Never Used  . Alcohol use No     Allergies   Lisinopril   Review of Systems Review of Systems 10 systems reviewed and all are negative for acute change except as noted in the HPI.   Physical Exam Updated Vital Signs BP 140/95 (BP Location: Left Arm)   Pulse 96   Temp 99.8 F (37.7 C) (Oral)   Resp 18   Ht 5\' 8"   (1.727 m)   Wt 189 lb (85.7 kg)   SpO2 100%   BMI 28.74 kg/m   Physical Exam  Constitutional: He is oriented to person, place, and time. He appears well-developed and well-nourished. No distress.  HENT:  Head: Normocephalic and atraumatic.  Right Ear: Hearing normal.  Left Ear: Hearing normal.  Nose: Nose normal.  Mouth/Throat: Oropharynx is clear and moist and mucous membranes are normal.  Eyes: Conjunctivae and EOM are normal. Pupils are equal, round, and reactive to light.  Neck: Normal range of motion. Neck supple.  Cardiovascular: Regular rhythm, S1 normal and S2 normal.  Exam reveals no gallop and no friction rub.   No murmur heard. Pulmonary/Chest: Effort normal and breath sounds normal. No respiratory distress. He exhibits no tenderness.  Abdominal: Soft. Normal appearance and bowel sounds are normal. There is  no hepatosplenomegaly. There is no tenderness. There is no rebound, no guarding, no tenderness at McBurney's point and negative Murphy's sign. No hernia.  Musculoskeletal: Normal range of motion.  Neurological: He is alert and oriented to person, place, and time. He has normal strength. No cranial nerve deficit or sensory deficit. Coordination normal. GCS eye subscore is 4. GCS verbal subscore is 5. GCS motor subscore is 6.  Skin: Skin is warm, dry and intact. No rash noted. No cyanosis.  Psychiatric: He has a normal mood and affect. His speech is normal and behavior is normal. Thought content normal.  Nursing note and vitals reviewed.  ED Treatments / Results  DIAGNOSTIC STUDIES:  Oxygen Saturation is 100% on RA, normal by my interpretation.    COORDINATION OF CARE:  1:13 AM Discussed treatment plan with pt at bedside and pt agreed to plan.   Labs (all labs ordered are listed, but only abnormal results are displayed) Labs Reviewed - No data to display  EKG  EKG Interpretation None       Radiology Dg Chest 2 View  Result Date: 07/22/2016 CLINICAL DATA:   Body aches fever and cough EXAM: CHEST  2 VIEW COMPARISON:  02/24/2016 FINDINGS: The heart size and mediastinal contours are within normal limits. Both lungs are clear. The visualized skeletal structures are unremarkable. IMPRESSION: No active cardiopulmonary disease. Electronically Signed   By: Jasmine Pang M.D.   On: 07/22/2016 00:07    Procedures Procedures (including critical care time)  Medications Ordered in ED Medications  oseltamivir (TAMIFLU) capsule 75 mg (75 mg Oral Given 07/22/16 0127)     Initial Impression / Assessment and Plan / ED Course  I have reviewed the triage vital signs and the nursing notes.  Pertinent labs & imaging results that were available during my care of the patient were reviewed by me and considered in my medical decision making (see chart for details).     Patient presents with complaints of fever, chills, myalgias, malaise, chest congestion and cough. Symptoms ongoing for 2 days. Patient's vital signs are normal. He is not hypoxic. Chest x-ray is clear. Examination is unremarkable. Will treat empirically for influenza.  Final Clinical Impressions(s) / ED Diagnoses   Final diagnoses:  Flu-like symptoms    New Prescriptions New Prescriptions   BENZONATATE (TESSALON) 100 MG CAPSULE    Take 1 capsule (100 mg total) by mouth every 8 (eight) hours.   HYDROCODONE-HOMATROPINE (HYCODAN) 5-1.5 MG/5ML SYRUP    Take 5 mLs by mouth every 6 (six) hours as needed for cough.   OSELTAMIVIR (TAMIFLU) 75 MG CAPSULE    Take 1 capsule (75 mg total) by mouth every 12 (twelve) hours.  I personally performed the services described in this documentation, which was scribed in my presence. The recorded information has been reviewed and is accurate.    Gilda Crease, MD 07/22/16 0130

## 2016-08-11 ENCOUNTER — Other Ambulatory Visit: Payer: Self-pay | Admitting: Interventional Cardiology

## 2016-09-05 ENCOUNTER — Emergency Department (HOSPITAL_COMMUNITY)
Admission: EM | Admit: 2016-09-05 | Discharge: 2016-09-05 | Disposition: A | Discharge status: Home / Self Care | Payer: BLUE CROSS/BLUE SHIELD | Attending: Emergency Medicine | Admitting: Emergency Medicine

## 2016-09-05 ENCOUNTER — Emergency Department (HOSPITAL_COMMUNITY): Payer: BLUE CROSS/BLUE SHIELD

## 2016-09-05 DIAGNOSIS — F17210 Nicotine dependence, cigarettes, uncomplicated: Secondary | ICD-10-CM | POA: Insufficient documentation

## 2016-09-05 DIAGNOSIS — M5442 Lumbago with sciatica, left side: Secondary | ICD-10-CM | POA: Insufficient documentation

## 2016-09-05 DIAGNOSIS — I129 Hypertensive chronic kidney disease with stage 1 through stage 4 chronic kidney disease, or unspecified chronic kidney disease: Secondary | ICD-10-CM | POA: Diagnosis not present

## 2016-09-05 DIAGNOSIS — Z8673 Personal history of transient ischemic attack (TIA), and cerebral infarction without residual deficits: Secondary | ICD-10-CM | POA: Diagnosis not present

## 2016-09-05 DIAGNOSIS — M545 Low back pain: Secondary | ICD-10-CM | POA: Diagnosis present

## 2016-09-05 DIAGNOSIS — M5441 Lumbago with sciatica, right side: Secondary | ICD-10-CM

## 2016-09-05 DIAGNOSIS — N182 Chronic kidney disease, stage 2 (mild): Secondary | ICD-10-CM | POA: Diagnosis not present

## 2016-09-05 DIAGNOSIS — Z7982 Long term (current) use of aspirin: Secondary | ICD-10-CM | POA: Insufficient documentation

## 2016-09-05 DIAGNOSIS — I2510 Atherosclerotic heart disease of native coronary artery without angina pectoris: Secondary | ICD-10-CM | POA: Insufficient documentation

## 2016-09-05 DIAGNOSIS — Z79899 Other long term (current) drug therapy: Secondary | ICD-10-CM | POA: Insufficient documentation

## 2016-09-05 MED ORDER — PREDNISONE 20 MG PO TABS: 40.0000 mg | ORAL_TABLET | Freq: Every day | ORAL | 0 refills | Status: DC

## 2016-09-05 MED ORDER — GABAPENTIN 300 MG PO CAPS: 300.0000 mg | ORAL_CAPSULE | Freq: Three times a day (TID) | ORAL | 0 refills | Status: DC

## 2016-09-05 NOTE — ED Provider Notes (Signed)
[facility]-EMERGENCY DEPT Provider Note   CSN: [id number] Arrival date & time: 09/05/16  1304 By signing my name below, I, [name], attest that this documentation has been prepared under the direction and in the presence of non-physician practitioner, Eyvonne Mechanic, PA-C. Electronically Signed: Levon English, Scribe. 09/05/2016. 3:26 PM.   History   Chief Complaint Chief Complaint  Patient presents with  . Back Pain   HPI [name] is a 56 y.o. male with a history of chronic back pain who presents to the Emergency Department complaining of persistent, gradually worsening, moderate lower back pain that radiates down his bilateral legs onset one month ago. Pt notes increased pain with movement; no alleviating factors noted. Pt has taken Ibuprofen with no significant relief of pain. Pt is currently employed as a Nutritional therapist. Pt has not been evaluated for this pain PTA. No hx of DM. He denies any numbness, weakness, or any other associated symptoms.   The history is provided by the patient. No language interpreter was used.   Past Medical History:  Diagnosis Date  . Apical variant hypertrophic cardiomyopathy (HCC)   . CAD (coronary artery disease)    a. LHC in 2011 with severe dz in D1/D2 and very distal LAD--> too small for PCI and managed medically. b.  LHC in 10/2013 after an abnormal nuclear study w/ non-obst dz/c. 01/2016 NSTEMI 2.5 x 38 mm Promus DES to Ramus    . Chronic back pain   . 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  Marland Kitchen HOH (hard of hearing)    rgiht ear  . Hyperlipidemia   . Hypertension   . Migraine headache   . Stroke [facility])     Patient Active Problem List   Diagnosis Date Noted  . Bradycardia   . AV block   .  Thiamine deficiency 11/19/2015  . Cephalalgia 11/19/2015  . Abnormal brain MRI 11/19/2015  . 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 Chronic renal insufficiency, stage II (mild) 07/02/2014  . Coronary artery disease involving native coronary artery of native heart with unstable angina pectoris (HCC)   . 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/2013  . Drug noncompliance 03/26/2013  . Migraine, unspecified, without mention of intractable migraine without mention of status migrainosus 03/26/2013  . GERD (gastroesophageal reflux disease) 11/14/2012  . Routine general medical examination at a health care facility 07/28/2012  . ERECTILE DYSFUNCTION 08/12/2010  . Hyperlipidemia with target LDL less than 70 10/15/2009  . TOBACCO USE 10/15/2009  . Essential hypertension 10/15/2009    Past Surgical History:  Procedure Laterality Date  . CARDIAC CATHETERIZATION N/A 02/24/2016   Procedure: Left Heart Cath and Coronary Angiography;  Surgeon: Tonny Bollman, MD;  Location: [facility] INVASIVE CV LAB;  Service: Cardiovascular;  Laterality: N/A;  . CARDIAC CATHETERIZATION N/A 02/24/2016   Procedure: Coronary Stent Intervention;  Surgeon: Tonny Bollman, MD;  Location: [facility] INVASIVE CV LAB;  Service: Cardiovascular;  Laterality: N/A;  . INGUINAL HERNIA REPAIR    . LEFT HEART CATHETERIZATION WITH CORONARY ANGIOGRAM N/A 11/22/2013   Procedure: LEFT HEART CATHETERIZATION WITH CORONARY ANGIOGRAM;  Surgeon: [name], MD;  Location: [facility] CATH LAB;  Service: Cardiovascular;  Laterality: N/A;  . TEE WITHOUT CARDIOVERSION N/A 08/22/2015   Procedure:  TRANSESOPHAGEAL ECHOCARDIOGRAM (TEE);  Surgeon: Vesta Mixer, MD;  Location: [facility] ENDOSCOPY;  Service: Cardiovascular;  Laterality: N/A;    Home Medications    Prior to Admission medications   Medication Sig Start Date End Date Taking? Authorizing Provider  amLODipine (NORVASC) 10 MG tablet TAKE 1 TABLET BY MOUTH EVERY DAY 08/11/16   Lyn Records, MD    aspirin EC 81 MG EC tablet Take 1 tablet (81 mg total) by mouth daily. 02/25/16   Little Ishikawa, NP  benzonatate (TESSALON) 100 MG capsule Take 1 capsule (100 mg total) by mouth every 8 (eight) hours. 07/22/16   Gilda Crease, MD  clopidogrel (PLAVIX) 75 MG tablet Take 1 tablet (75 mg total) by mouth daily with breakfast. 02/26/16   Little Ishikawa, NP  gabapentin (NEURONTIN) 300 MG capsule Take 1 capsule (300 mg total) by mouth 3 (three) times daily. 09/05/16   Eyvonne Mechanic, PA-C  hydrALAZINE (APRESOLINE) 100 MG tablet Take 1 tablet (100 mg total) by mouth 3 (three) times daily. 02/25/16   Little Ishikawa, NP  HYDROcodone-homatropine (HYCODAN) 5-1.5 MG/5ML syrup Take 5 mLs by mouth every 6 (six) hours as needed for cough. 07/22/16   Gilda Crease, MD  irbesartan (AVAPRO) 300 MG tablet Take 1 tablet (300 mg total) by mouth daily. 02/26/16   Little Ishikawa, NP  isosorbide mononitrate (IMDUR) 30 MG 24 hr tablet Take 1 tablet (30 mg total) by mouth daily. 03/04/16 06/02/16  Rosalio Macadamia, NP  nitroGLYCERIN (NITROSTAT) 0.4 MG SL tablet Place 1 tablet (0.4 mg total) under the tongue every 5 (five) minutes as needed for chest pain. 02/12/16 05/12/16  Lyn Records, MD  oseltamivir (TAMIFLU) 75 MG capsule Take 1 capsule (75 mg total) by mouth every 12 (twelve) hours. 07/22/16   Gilda Crease, MD  predniSONE (DELTASONE) 20 MG tablet Take 2 tablets (40 mg total) by mouth daily. 09/05/16   Eyvonne Mechanic, PA-C  rosuvastatin (CRESTOR) 40 MG tablet Take 1 tablet (40 mg total) by mouth daily. 10/06/15   Beatrice Lecher, PA-C  thiamine (VITAMIN B-1) 100 MG tablet Take 1 tablet (100 mg total) by mouth daily. 11/24/15   Etta Grandchild, MD    Family History Family History  Problem Relation Age of Onset  . Hypertension Mother   . Heart failure Mother   . Hypertension Father   . Colon cancer Father 46  . Colon cancer Paternal Uncle     Social History Social History  Substance Use Topics  . Smoking status:  Current Some Day Smoker    Packs/day: 0.00    Types: Cigarettes  . Smokeless tobacco: Never Used  . Alcohol use No   Allergies   Lisinopril   Review of Systems Review of Systems 10 systems reviewed and are negative for acute change except as noted in the HPI.   Physical Exam Updated Vital Signs BP 135/82 (BP Location: Left Arm)   Pulse 83   Temp 98.2 F (36.8 C) (Oral)   Resp 20   Ht 5\' 8"  (1.727 m)   Wt 83.1 kg   SpO2 100%   BMI 27.87 kg/m   Physical Exam  Constitutional: He is oriented to person, place, and time. He appears well-developed and well-nourished. No distress.  HENT:  Head: Normocephalic and atraumatic.  Eyes: Conjunctivae are normal.  Cardiovascular: Normal rate.   Pulmonary/Chest: Effort normal.  Abdominal: He exhibits no distension.  Musculoskeletal:  Tenderness to palpation bilateral lower lumbar  soft tissue.  Straight leg negative, distal sensation strength and motor function intact.  Neurological: He is alert and oriented to person, place, and time.  Skin: Skin is warm and dry.  Psychiatric: He has a normal mood and affect.  Nursing note and vitals reviewed.  ED Treatments / Results  DIAGNOSTIC STUDIES:  Oxygen Saturation is 100% on RA, normal by my interpretation.    COORDINATION OF CARE:  3:27 PM Will order DG lumbar spine. Discussed treatment plan with pt at bedside and pt agreed to plan.   Labs (all labs ordered are listed, but only abnormal results are displayed) Labs Reviewed - No data to display  EKG  EKG Interpretation None      Radiology Dg Lumbar Spine Complete  Result Date: 09/05/2016 CLINICAL DATA:  Low back and buttock pain for 1 month. No known injury. EXAM: LUMBAR SPINE - COMPLETE 4+ VIEW COMPARISON:  CT on 10/06/2014 FINDINGS: There is no evidence of lumbar spine fracture. Alignment is normal. Intervertebral disc spaces are maintained. No other osseous abnormality identified. Aortic atherosclerosis. A dense  calcification is again seen in the right lower quadrant, consistent with an appendicolith. These findings show no significant change compared to previous CT. IMPRESSION: No radiographic abnormality of lumbar spine. Electronically Signed   By: Myles Rosenthal M.D.   On: 09/05/2016 16:15    Procedures Procedures (including critical care time)  Medications Ordered in ED Medications - No data to display   Initial Impression / Assessment and Plan / ED Course  I have reviewed the triage vital signs and the nursing notes.  Pertinent labs & imaging results that were available during my care of the patient were reviewed by me and considered in my medical decision making (see chart for details).      Final Clinical Impressions(s) / ED Diagnoses   Final diagnoses:  Acute bilateral low back pain with bilateral sciatica    56 year old male presents today with back pain with bilateral sciatica.  Patient has no neurological deficits, no red flags for back pain.  Patient has been using NSAIDs at home without significant relief of symptoms.  Patient will be started on steroids and gabapentin.  He will have close follow-up with neurosurgery for further evaluation and management.  Strict return precautions given.  Patient verbalized understanding and agreement to today's plan had no further questions or concerns at time discharge  New Prescriptions New Prescriptions   GABAPENTIN (NEURONTIN) 300 MG CAPSULE    Take 1 capsule (300 mg total) by mouth 3 (three) times daily.   PREDNISONE (DELTASONE) 20 MG TABLET    Take 2 tablets (40 mg total) by mouth daily.   I personally performed the services described in this documentation, which was scribed in my presence. The recorded information has been reviewed and is accurate.   Eyvonne Mechanic, PA-C 09/05/16 1637    Doug Sou, MD 09/06/16 [phone]

## 2016-09-05 NOTE — ED Notes (Signed)
Bed: WTR5 Expected date:  Expected time:  Means of arrival:  Comments: 

## 2016-09-05 NOTE — ED Notes (Signed)
Discharge instructions, follow up care, and rx x2 reviewed with patient. Patient verbalized understanding. 

## 2016-09-05 NOTE — Discharge Instructions (Signed)
Please read attached information. If you experience any new or worsening signs or symptoms please return to the emergency room for evaluation. Please follow-up with your primary care provider or specialist as discussed. Please use medication prescribed only as directed and discontinue taking if you have any concerning signs or symptoms.   °

## 2016-09-05 NOTE — ED Triage Notes (Addendum)
Pt c/o lower back pain radiating down legs x 1 month. No urinary symptoms, nausea, emesis, diarrhea, change in bowel or bladder. Ambulatory.

## 2016-09-20 ENCOUNTER — Other Ambulatory Visit: Payer: Self-pay | Admitting: *Deleted

## 2016-09-20 ENCOUNTER — Other Ambulatory Visit: Payer: BLUE CROSS/BLUE SHIELD

## 2016-09-20 DIAGNOSIS — I25119 Atherosclerotic heart disease of native coronary artery with unspecified angina pectoris: Secondary | ICD-10-CM

## 2016-09-20 DIAGNOSIS — E785 Hyperlipidemia, unspecified: Secondary | ICD-10-CM

## 2016-09-20 LAB — HEPATIC FUNCTION PANEL
ALK PHOS: 74 IU/L (ref 39–117)
ALT: 9 IU/L (ref 0–44)
AST: 16 IU/L (ref 0–40)
Albumin: 4.3 g/dL (ref 3.5–5.5)
BILIRUBIN TOTAL: 0.4 mg/dL (ref 0.0–1.2)
BILIRUBIN, DIRECT: 0.1 mg/dL (ref 0.00–0.40)
TOTAL PROTEIN: 6.8 g/dL (ref 6.0–8.5)

## 2016-09-20 LAB — LIPID PANEL
CHOLESTEROL TOTAL: 200 mg/dL — AB (ref 100–199)
Chol/HDL Ratio: 4.9 ratio (ref 0.0–5.0)
HDL: 41 mg/dL (ref >39)
LDL Calculated: 145 mg/dL — ABNORMAL HIGH (ref 0–99)
Triglycerides: 71 mg/dL (ref 0–149)
VLDL CHOLESTEROL CAL: 14 mg/dL (ref 5–40)

## 2016-09-21 ENCOUNTER — Telehealth: Payer: Self-pay | Admitting: *Deleted

## 2016-09-21 DIAGNOSIS — I25119 Atherosclerotic heart disease of native coronary artery with unspecified angina pectoris: Secondary | ICD-10-CM

## 2016-09-21 DIAGNOSIS — E785 Hyperlipidemia, unspecified: Secondary | ICD-10-CM

## 2016-09-21 MED ORDER — ROSUVASTATIN CALCIUM 40 MG PO TABS: 40.0000 mg | ORAL_TABLET | Freq: Every day | ORAL | 3 refills | Status: DC

## 2016-09-21 NOTE — Telephone Encounter (Signed)
DPR on file ok to s/w pt's wife. Pt's wife notified of lab results. I asked if pt has been taking Crestor. Wife read back medications to me and she did not have Crestor for pt. Rx for Crestor has been sent to [facility]. FLP/LFT to be done when pt sees Dr. Katrinka Blazing 11/29/16 @ 8 am. Dr. Katrinka Blazing appt scheduled per recall in Epic.Marland KitchenMarland Kitchen

## 2016-09-21 NOTE — Telephone Encounter (Signed)
-----   Message from Beatrice Lecher, [place] sent at 09/20/2016  4:52 PM EDT ----- Please call the patient LDL too high. LFTs ok. Is he taking Crestor? Tereso Newcomer, PA-C    09/20/2016 4:51 PM

## 2016-09-22 ENCOUNTER — Encounter: Payer: Self-pay | Admitting: Internal Medicine

## 2016-09-22 ENCOUNTER — Ambulatory Visit (INDEPENDENT_AMBULATORY_CARE_PROVIDER_SITE_OTHER): Payer: BLUE CROSS/BLUE SHIELD | Admitting: Internal Medicine

## 2016-09-22 ENCOUNTER — Other Ambulatory Visit (INDEPENDENT_AMBULATORY_CARE_PROVIDER_SITE_OTHER): Payer: BLUE CROSS/BLUE SHIELD

## 2016-09-22 VITALS — BP 138/84 | HR 77 | Temp 98.5°F | Resp 16 | Ht 68.0 in | Wt 187.5 lb

## 2016-09-22 DIAGNOSIS — I10 Essential (primary) hypertension: Secondary | ICD-10-CM

## 2016-09-22 DIAGNOSIS — M5416 Radiculopathy, lumbar region: Secondary | ICD-10-CM | POA: Diagnosis not present

## 2016-09-22 DIAGNOSIS — E519 Thiamine deficiency, unspecified: Secondary | ICD-10-CM

## 2016-09-22 DIAGNOSIS — D539 Nutritional anemia, unspecified: Secondary | ICD-10-CM | POA: Diagnosis not present

## 2016-09-22 DIAGNOSIS — M48061 Spinal stenosis, lumbar region without neurogenic claudication: Secondary | ICD-10-CM | POA: Insufficient documentation

## 2016-09-22 LAB — IBC PANEL
Iron: 90 ug/dL (ref 42–165)
Saturation Ratios: 31.1 % (ref 20.0–50.0)
Transferrin: 207 mg/dL — ABNORMAL LOW (ref 212.0–360.0)

## 2016-09-22 LAB — CBC WITH DIFFERENTIAL/PLATELET
BASOS PCT: 1 % (ref 0.0–3.0)
Basophils Absolute: 0.1 10*3/uL (ref 0.0–0.1)
EOS PCT: 4.6 % (ref 0.0–5.0)
Eosinophils Absolute: 0.3 10*3/uL (ref 0.0–0.7)
HCT: 38.3 % — ABNORMAL LOW (ref 39.0–52.0)
Hemoglobin: 12.7 g/dL — ABNORMAL LOW (ref 13.0–17.0)
Lymphocytes Relative: 22.8 % (ref 12.0–46.0)
Lymphs Abs: 1.6 10*3/uL (ref 0.7–4.0)
MCHC: 33.1 g/dL (ref 30.0–36.0)
MCV: 91 fl (ref 78.0–100.0)
MONO ABS: 0.5 10*3/uL (ref 0.1–1.0)
MONOS PCT: 7.5 % (ref 3.0–12.0)
NEUTROS ABS: 4.4 10*3/uL (ref 1.4–7.7)
NEUTROS PCT: 64.1 % (ref 43.0–77.0)
Platelets: 306 10*3/uL (ref 150.0–400.0)
RBC: 4.21 Mil/uL — ABNORMAL LOW (ref 4.22–5.81)
RDW: 12.7 % (ref 11.5–15.5)
WBC: 6.8 10*3/uL (ref 4.0–10.5)

## 2016-09-22 LAB — VITAMIN B12: Vitamin B-12: 421 pg/mL (ref 211–911)

## 2016-09-22 LAB — URINALYSIS, ROUTINE W REFLEX MICROSCOPIC
BILIRUBIN URINE: NEGATIVE
Hgb urine dipstick: NEGATIVE
Ketones, ur: NEGATIVE
Leukocytes, UA: NEGATIVE
Nitrite: NEGATIVE
PH: 6 (ref 5.0–8.0)
RBC / HPF: NONE SEEN (ref 0–?)
Specific Gravity, Urine: >=1.03 — AB (ref 1.000–1.030)
TOTAL PROTEIN, URINE-UPE24: NEGATIVE
UROBILINOGEN UA: 0.2 (ref 0.0–1.0)
Urine Glucose: NEGATIVE

## 2016-09-22 LAB — BASIC METABOLIC PANEL
BUN: 15 mg/dL (ref 6–23)
CHLORIDE: 107 meq/L (ref 96–112)
CO2: 25 meq/L (ref 19–32)
Calcium: 9.6 mg/dL (ref 8.4–10.5)
Creatinine, Ser: 1.3 mg/dL (ref 0.40–1.50)
GFR: 73.71 mL/min (ref >60.00)
GLUCOSE: 90 mg/dL (ref 70–99)
Potassium: 4 mEq/L (ref 3.5–5.1)
SODIUM: 140 meq/L (ref 135–145)

## 2016-09-22 LAB — FERRITIN: Ferritin: 119.8 ng/mL (ref 22.0–322.0)

## 2016-09-22 LAB — FOLATE: Folate: 13.5 ng/mL (ref >5.9)

## 2016-09-22 MED ORDER — TRAMADOL HCL ER 100 MG PO TB24: 100.0000 mg | ORAL_TABLET | Freq: Every day | ORAL | 1 refills | Status: DC

## 2016-09-22 NOTE — Progress Notes (Signed)
Subjective:  Patient ID: [name], male    DOB: [birth year]  Age: 56 y.o. MRN: [id number]  CC: Hypertension and Back Pain   HPI Kristian Batdorf presents for a blood pressure check and complains of a 3 month history of low back pain. He was recently seen by someone else and had plain films of the lower back that were normal. The pain is mostly on the left side and radiates into his left lower extremity and he complains of tingling in his left foot. He is getting symptom relief with Motrin but he says there is some pain that interferes with his activity and keeps him awake at night that is not controlled by Motrin. He denies any bowel or bladder incontinence or retention and his had no weakness in the left lower extremity.  Outpatient Medications Prior to Visit  Medication Sig Dispense Refill  . amLODipine (NORVASC) 10 MG tablet TAKE 1 TABLET BY MOUTH EVERY DAY 30 tablet 8  . aspirin EC 81 MG EC tablet Take 1 tablet (81 mg total) by mouth daily.    . clopidogrel (PLAVIX) 75 MG tablet Take 1 tablet (75 mg total) by mouth daily with breakfast. 30 tablet 12  . gabapentin (NEURONTIN) 300 MG capsule Take 1 capsule (300 mg total) by mouth 3 (three) times daily. 90 capsule 0  . hydrALAZINE (APRESOLINE) 100 MG tablet Take 1 tablet (100 mg total) by mouth 3 (three) times daily. 90 tablet 12  . irbesartan (AVAPRO) 300 MG tablet Take 1 tablet (300 mg total) by mouth daily. 30 tablet 12  . rosuvastatin (CRESTOR) 40 MG tablet Take 1 tablet (40 mg total) by mouth daily. 90 tablet 3  . thiamine (VITAMIN B-1) 100 MG tablet Take 1 tablet (100 mg total) by mouth daily. 90 tablet 3  . nitroGLYCERIN (NITROSTAT) 0.4 MG SL tablet Place 1 tablet (0.4 mg total) under the tongue every 5 (five) minutes as needed for chest pain. 25 tablet 1  . benzonatate (TESSALON) 100 MG capsule Take 1 capsule (100 mg total) by mouth every 8 (eight) hours. 21 capsule 0  . HYDROcodone-homatropine (HYCODAN) 5-1.5 MG/5ML syrup Take  5 mLs by mouth every 6 (six) hours as needed for cough. 75 mL 0  . isosorbide mononitrate (IMDUR) 30 MG 24 hr tablet Take 1 tablet (30 mg total) by mouth daily. 30 tablet 11  . oseltamivir (TAMIFLU) 75 MG capsule Take 1 capsule (75 mg total) by mouth every 12 (twelve) hours. 10 capsule 0  . predniSONE (DELTASONE) 20 MG tablet Take 2 tablets (40 mg total) by mouth daily. 10 tablet 0   No facility-administered medications prior to visit.     ROS Review of Systems  Constitutional: Negative for chills, fatigue and fever.  HENT: Negative.   Eyes: Negative.  Negative for visual disturbance.  Respiratory: Negative for cough, chest tightness, shortness of breath and wheezing.   Cardiovascular: Negative for chest pain, palpitations and leg swelling.  Gastrointestinal: Negative for abdominal pain, constipation, diarrhea and nausea.  Endocrine: Negative.   Genitourinary: Negative.  Negative for difficulty urinating and urgency.  Musculoskeletal: Positive for back pain. Negative for arthralgias, myalgias and neck pain.  Skin: Negative.   Allergic/Immunologic: Negative.   Neurological: Negative.  Negative for dizziness, tremors, weakness and numbness.  Hematological: Negative for adenopathy. Does not bruise/bleed easily.  Psychiatric/Behavioral: Negative.     Objective:  BP 138/84 (BP Location: Left Arm, Patient Position: Sitting, Cuff Size: Normal)   Pulse 77   Temp 98.5  F (36.9 C) (Oral)   Resp 16   Ht 5\' 8"  (1.727 m)   Wt 187 lb 8 oz (85 kg)   SpO2 98%   BMI 28.51 kg/m   BP Readings from Last 3 Encounters:  09/22/16 138/84  09/05/16 129/83  07/22/16 134/88    Wt Readings from Last 3 Encounters:  09/22/16 187 lb 8 oz (85 kg)  09/05/16 183 lb 4.8 oz (83.1 kg)  07/21/16 189 lb (85.7 kg)    Physical Exam  Constitutional: He is oriented to person, place, and time. No distress.  HENT:  Mouth/Throat: Oropharynx is clear and moist. No oropharyngeal exudate.  Eyes: Conjunctivae  are normal. Right eye exhibits no discharge. Left eye exhibits no discharge. No scleral icterus.  Neck: Normal range of motion. Neck supple. No JVD present. No tracheal deviation present. No thyromegaly present.  Cardiovascular: Normal rate, regular rhythm, normal heart sounds and intact distal pulses.  Exam reveals no gallop and no friction rub.   No murmur heard. Pulmonary/Chest: Effort normal and breath sounds normal. No stridor. No respiratory distress. He has no wheezes. He has no rales. He exhibits no tenderness.  Abdominal: Soft. Bowel sounds are normal. He exhibits no distension and no mass. There is no tenderness. There is no rebound and no guarding.  Musculoskeletal: Normal range of motion. He exhibits no edema, tenderness or deformity.       Lumbar back: Normal.  Lymphadenopathy:    He has no cervical adenopathy.  Neurological: He is oriented to person, place, and time. He has normal strength. He displays no atrophy, no tremor and normal reflexes. No cranial nerve deficit or sensory deficit. He exhibits normal muscle tone. He displays a negative Romberg sign. He displays no seizure activity. Coordination and gait normal.  Reflex Scores:      Tricep reflexes are 1+ on the right side and 1+ on the left side.      Bicep reflexes are 1+ on the right side and 1+ on the left side.      Brachioradialis reflexes are 1+ on the right side and 1+ on the left side.      Patellar reflexes are 1+ on the right side and 1+ on the left side.      Achilles reflexes are 0 on the right side and 0 on the left side. +SLR in LLE -SLR in RLE  Skin: Skin is warm and dry. No rash noted. He is not diaphoretic. No erythema. No pallor.  Vitals reviewed.   Lab Results  Component Value Date   WBC 6.8 09/22/2016   HGB 12.7 (L) 09/22/2016   HCT 38.3 (L) 09/22/2016   PLT 306.0 09/22/2016   GLUCOSE 90 09/22/2016   CHOL 200 (H) 09/20/2016   TRIG 71 09/20/2016   HDL 41 09/20/2016   LDLCALC 145 (H) 09/20/2016     ALT 9 09/20/2016   AST 16 09/20/2016   NA 140 09/22/2016   K 4.0 09/22/2016   CL 107 09/22/2016   CREATININE 1.30 09/22/2016   BUN 15 09/22/2016   CO2 25 09/22/2016   TSH 2.409 02/24/2016   PSA 1.77 08/27/2014   INR 1.06 02/24/2016   HGBA1C  09/17/2009    5.1 (NOTE)  According to the ADA Clinical Practice Recommendations for 2011, when HbA1c is used as a screening test:   >=6.5%   Diagnostic of Diabetes Mellitus           (if abnormal result  is confirmed)  5.7-6.4%   Increased risk of developing Diabetes Mellitus  References:Diagnosis and Classification of Diabetes Mellitus,Diabetes Care,2011,34(Suppl 1):S62-S69 and Standards of Medical Care in         Diabetes - 2011,Diabetes Care,2011,34  (Suppl 1):S11-S61.    Dg Lumbar Spine Complete  Result Date: 09/05/2016 CLINICAL DATA:  Low back and buttock pain for 1 month. No known injury. EXAM: LUMBAR SPINE - COMPLETE 4+ VIEW COMPARISON:  CT on 10/06/2014 FINDINGS: There is no evidence of lumbar spine fracture. Alignment is normal. Intervertebral disc spaces are maintained. No other osseous abnormality identified. Aortic atherosclerosis. A dense calcification is again seen in the right lower quadrant, consistent with an appendicolith. These findings show no significant change compared to previous CT. IMPRESSION: No radiographic abnormality of lumbar spine. Electronically Signed   By: Myles Rosenthal M.D.   On: 09/05/2016 16:15    Assessment & Plan:   Trayvon was seen today for hypertension and back pain.  Diagnoses and all orders for this visit:  Left lumbar radiculitis- He has a 3 month history of low back pain that is not controlled with ibuprofen and the pain is interfering with his activities and ability to sleep so Avastin to add tramadol to the ibuprofen. He has a positive straight leg raise on left lower extremity so will get an MRI of the lumbar spine to see if he has a  disc herniation, spinal tumor, or nerve impingement. -     MR LUMBAR SPINE WO CONTRAST; Future -     Ambulatory referral to Physical Therapy -     traMADol (ULTRAM-ER) 100 MG 24 hr tablet; Take 1 tablet (100 mg total) by mouth daily.  Essential hypertension- his blood pressure is adequately well controlled, electrolytes and renal function are normal. -     Basic metabolic panel; Future -     Urinalysis, Routine w reflex microscopic; Future  Thiamine deficiency- he continues to be anemic, will recheck his thiamine level. -     CBC with Differential/Platelet; Future -     Vitamin B1; Future  Deficiency anemia- he remains mildly anemic, will recheck his thiamine level and will screen for other vitamin deficiencies and will check a reticulocyte count to screen for bone marrow failure. -     CBC with Differential/Platelet; Future -     IBC panel; Future -     Vitamin B12; Future -     Ferritin; Future -     Folate; Future -     Reticulocytes; Future   I have discontinued Mr. Mcwherter's isosorbide mononitrate, benzonatate, HYDROcodone-homatropine, oseltamivir, and predniSONE. I am also having him start on traMADol. Additionally, I am having him maintain his thiamine, nitroGLYCERIN, aspirin, clopidogrel, hydrALAZINE, irbesartan, amLODipine, gabapentin, and rosuvastatin.  Meds ordered this encounter  Medications  . traMADol (ULTRAM-ER) 100 MG 24 hr tablet    Sig: Take 1 tablet (100 mg total) by mouth daily.    Dispense:  90 tablet    Refill:  1     Follow-up: Return in about 2 months (around 11/22/2016).  Sanda Linger, MD

## 2016-09-22 NOTE — Progress Notes (Signed)
Pre visit review using our clinic review tool, if applicable. No additional management support is needed unless otherwise documented below in the visit note. 

## 2016-09-22 NOTE — Patient Instructions (Signed)

## 2016-09-23 ENCOUNTER — Telehealth: Payer: Self-pay | Admitting: Internal Medicine

## 2016-09-23 NOTE — Telephone Encounter (Signed)
MRI ordered for Pt needs to be authorized.

## 2016-09-26 LAB — VITAMIN B1: VITAMIN B1 (THIAMINE): 26 nmol/L (ref 8–30)

## 2016-09-27 ENCOUNTER — Encounter: Payer: Self-pay | Admitting: Internal Medicine

## 2016-09-27 ENCOUNTER — Other Ambulatory Visit: Payer: Self-pay | Admitting: Internal Medicine

## 2016-09-27 ENCOUNTER — Ambulatory Visit
Admission: RE | Admit: 2016-09-27 | Discharge: 2016-09-27 | Disposition: A | Discharge status: Home / Self Care | Payer: BLUE CROSS/BLUE SHIELD | Source: Ambulatory Visit | Attending: Internal Medicine | Admitting: Internal Medicine

## 2016-09-27 DIAGNOSIS — M5416 Radiculopathy, lumbar region: Secondary | ICD-10-CM

## 2016-09-27 DIAGNOSIS — M5106 Intervertebral disc disorders with myelopathy, lumbar region: Secondary | ICD-10-CM | POA: Insufficient documentation

## 2016-09-27 DIAGNOSIS — M48061 Spinal stenosis, lumbar region without neurogenic claudication: Secondary | ICD-10-CM

## 2016-09-28 ENCOUNTER — Telehealth: Payer: Self-pay

## 2016-09-28 NOTE — Telephone Encounter (Signed)
Started via covermymeds  Key: [id number]

## 2016-09-29 ENCOUNTER — Other Ambulatory Visit: Payer: BLUE CROSS/BLUE SHIELD

## 2016-09-29 DIAGNOSIS — E785 Hyperlipidemia, unspecified: Secondary | ICD-10-CM

## 2016-09-29 LAB — RETICULOCYTES

## 2016-09-29 NOTE — Telephone Encounter (Signed)
Pts wife called and said that as of Monday they do not have insurance. She said that they are going to look to try to find a coupon through [employer].

## 2016-09-29 NOTE — Telephone Encounter (Signed)
Closing note 

## 2016-11-02 ENCOUNTER — Telehealth: Payer: Self-pay | Admitting: Interventional Cardiology

## 2016-11-02 NOTE — Telephone Encounter (Signed)
Request for surgical clearance:  1. What type of surgery is being performed? Lumbar Laminectomy   2. When is this surgery scheduled?  Pending   3. Are there any medications that need to be held prior to surgery and how long? ASA, if needed   4. Name of physician performing surgery?  Dr. Shirlean Kelly   5. What is your office phone and fax number?  [facility] [phone] Alison Stalling

## 2016-11-02 NOTE — Telephone Encounter (Signed)
Cleared for upcoming surgery. Better to wait until July to be as close to 1 year after stent as possible. Aspirin should be continued if possible. Plavix should/can be help 7 days prior to surgery.

## 2016-11-03 NOTE — Telephone Encounter (Signed)
Faxed to requesting office. 

## 2016-11-28 NOTE — Progress Notes (Signed)
Cardiology Office Note    Date:  11/29/2016   ID:  [name], DOB 08/01/1960, MRN [id number]  PCP:  Etta Grandchild, MD  Cardiologist: [name], MD   Chief Complaint  Patient presents with  . Coronary Artery Disease    Angina  . Hyperlipidemia    History of Present Illness:  [name] is a 56 y.o. male with HTN and CAD, who was admitted several weeks ago with an NSTEMI. The patient was treated with PCI to a large RI stenosis. During his hospital stay, he was noted to be bradycardic and his beta blocker was held.During the hospital stay he was noted to have bradycardia and continuous monitoring demonstrated high-grade AV block during sleep with greater than 5 second pauses. He was seen by Dr. Gilman Schmidt who felt that conservative management was the appropriate approach.  Has had angina since ramus intermedius stenting in September 2017 during acute coronary syndrome. He has not had syncope, still has angina with moderate to heavy activity in warm weather. Does not use nitroglycerin because it makes his head hurt. If he rests the discomfort goes away. This pattern is stable.  Past Medical History:  Diagnosis Date  . Apical variant hypertrophic cardiomyopathy (HCC)   . CAD (coronary artery disease)    a. LHC in 2011 with severe dz in D1/D2 and very distal LAD--> too small for PCI and managed medically. b.  LHC in 10/2013 after an abnormal nuclear study w/ non-obst dz/c. 01/2016 NSTEMI 2.5 x 38 mm Promus DES to Ramus    . Chronic back pain   . 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  Marland Kitchen HOH (hard of hearing)    rgiht ear  . Hyperlipidemia   . Hypertension   . Migraine headache   . Stroke [facility])     Past  Surgical History:  Procedure Laterality Date  . CARDIAC CATHETERIZATION N/A 02/24/2016   Procedure: Left Heart Cath and Coronary Angiography;  Surgeon: Tonny Bollman, MD;  Location: [facility] INVASIVE CV LAB;  Service: Cardiovascular;  Laterality: N/A;  . CARDIAC CATHETERIZATION N/A 02/24/2016   Procedure: Coronary Stent Intervention;  Surgeon: Tonny Bollman, MD;  Location: [facility] INVASIVE CV LAB;  Service: Cardiovascular;  Laterality: N/A;  . INGUINAL HERNIA REPAIR    . LEFT HEART CATHETERIZATION WITH CORONARY ANGIOGRAM N/A 11/22/2013   Procedure: LEFT HEART CATHETERIZATION WITH CORONARY ANGIOGRAM;  Surgeon: [name], MD;  Location: [facility] CATH LAB;  Service: Cardiovascular;  Laterality: N/A;  . TEE WITHOUT CARDIOVERSION N/A 08/22/2015   Procedure: TRANSESOPHAGEAL ECHOCARDIOGRAM (TEE);  Surgeon: Vesta Mixer, MD;  Location: [facility] ENDOSCOPY;  Service: Cardiovascular;  Laterality: N/A;    Current Medications: Outpatient Medications Prior to Visit  Medication Sig Dispense Refill  . amLODipine (NORVASC) 10 MG tablet TAKE 1 TABLET BY MOUTH EVERY DAY 30 tablet 8  . aspirin EC 81 MG EC tablet Take 1 tablet (81 mg total) by mouth daily.    . clopidogrel (PLAVIX) 75 MG tablet Take 1 tablet (75 mg total) by mouth daily with breakfast. 30 tablet 12  . gabapentin (NEURONTIN) 300 MG capsule Take 1 capsule (300 mg total) by mouth 3 (three) times daily. 90 capsule 0  .  hydrALAZINE (APRESOLINE) 100 MG tablet Take 1 tablet (100 mg total) by mouth 3 (three) times daily. 90 tablet 12  . irbesartan (AVAPRO) 300 MG tablet Take 1 tablet (300 mg total) by mouth daily. 30 tablet 12  . rosuvastatin (CRESTOR) 40 MG tablet Take 1 tablet (40 mg total) by mouth daily. 90 tablet 3  . thiamine (VITAMIN B-1) 100 MG tablet Take 1 tablet (100 mg total) by mouth daily. 90 tablet 3  . traMADol (ULTRAM-ER) 100 MG 24 hr tablet Take 1 tablet (100 mg total) by mouth daily. 90 tablet 1  . nitroGLYCERIN (NITROSTAT) 0.4 MG SL tablet Place 1  tablet (0.4 mg total) under the tongue every 5 (five) minutes as needed for chest pain. 25 tablet 1   No facility-administered medications prior to visit.      Allergies:   Lisinopril   Social History   Social History  . Marital status: Married    Spouse name: N/A  . Number of children: N/A  . Years of education: N/A   Social History Main Topics  . Smoking status: Current Some Day Smoker    Packs/day: 0.00    Types: Cigarettes  . Smokeless tobacco: Never Used  . Alcohol use No  . Drug use: No  . Sexual activity: Not Asked   Other Topics Concern  . None   Social History Narrative  . None     Family History:  The patient's family history includes Colon cancer in his paternal uncle; Colon cancer (age of onset: 68) in his father; Heart failure in his mother; Hypertension in his father and mother.   ROS:   Please see the history of present illness.    Unexplained weight gain, back pain, otherwise no complaints  All other systems reviewed and are negative.   PHYSICAL EXAM:   VS:  BP 122/78   Pulse 70   Ht 5\' 8"  (1.727 m)   Wt 183 lb (83 kg)   BMI 27.83 kg/m    GEN: Well nourished, well developed, in no acute distress  HEENT: normal  Neck: no JVD, carotid bruits, or masses Cardiac: RRR; no murmurs, rubs, or gallops,no edema  Respiratory:  clear to auscultation bilaterally, normal work of breathing GI: soft, nontender, nondistended, + BS MS: no deformity or atrophy  Skin: warm and dry, no rash Neuro:  Alert and Oriented x 3, Strength and sensation are intact Psych: euthymic mood, full affect  Wt Readings from Last 3 Encounters:  11/29/16 183 lb (83 kg)  09/22/16 187 lb 8 oz (85 kg)  09/05/16 183 lb 4.8 oz (83.1 kg)      Studies/Labs Reviewed:   EKG:  EKG  Not repeated  Recent Labs: 02/24/2016: TSH 2.409 09/20/2016: ALT 9 09/22/2016: BUN 15; Creatinine, Ser 1.30; Hemoglobin 12.7; Platelets 306.0; Potassium 4.0; Sodium 140   Lipid Panel    Component Value  Date/Time   CHOL 200 (H) 09/20/2016 0942   TRIG 71 09/20/2016 0942   HDL 41 09/20/2016 0942   CHOLHDL 4.9 09/20/2016 0942   CHOLHDL 4.0 02/25/2016 0341   VLDL 16 02/25/2016 0341   LDLCALC 145 (H) 09/20/2016 0942    Additional studies/ records that were reviewed today include:  CARDIAC CATHETERIZATION AND PCI September 2017: Diagnostic Diagram       Post-Intervention Diagram           ASSESSMENT:    1. 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  Essential hypertension   3. Apical variant hypertrophic cardiomyopathy (HCC)   4. AV block   5. Chronic renal insufficiency, stage II (mild)      PLAN:  In order of problems listed above:  1. Stable angina pectoris with anatomy as noted above. No change in pattern over the past 9 months. Plan aerobic activity, sublingual nitroglycerin if needed, and notify us if increasing anginal severity. Plan medical therapy for now. 2. Target blood pressure 130/90 mmHg a less. 3. This is not a confirmed diagnosis. Less have chronically abnormal EKG with precordial T-wave abnormality. 4. This is been evaluated and since it occurs predominantly during sleep, no therapy is indicated based upon consultation with EP. 5. Hyperlipidemia with LDL greater than target of 70 when last evaluated. LDL will be obtained today. 6. Not addressed.  Clinical follow-up in 6-9 months. Lipid panel today.  Medication Adjustments/Labs and Tests Ordered: Current medicines are reviewed at length with the patient today.  Concerns regarding medicines are outlined above.  Medication changes, Labs and Tests ordered today are listed in the Patient Instructions below. Patient Instructions  Medication Instructions:  None  Labwork: None  Testing/Procedures: None  Follow-Up: Your physician wants you to follow-up in: 6-9 months with Dr. Katrinka Blazing.  You will receive a reminder letter in the mail two months in  advance. If you don't receive a letter, please call our office to schedule the follow-up appointment.   Any Other Special Instructions Will Be Listed Below (If Applicable).     If you need a refill on your cardiac medications before your next appointment, please call your pharmacy.      Signed, [name], MD  11/29/2016 8:33 AM    [facility] [address] [place], [place], [place]  [address] Phone: [phone]; Fax: [phone]

## 2016-11-29 ENCOUNTER — Encounter: Payer: Self-pay | Admitting: Interventional Cardiology

## 2016-11-29 ENCOUNTER — Other Ambulatory Visit: Payer: Self-pay | Admitting: *Deleted

## 2016-11-29 ENCOUNTER — Telehealth: Payer: Self-pay | Admitting: *Deleted

## 2016-11-29 ENCOUNTER — Ambulatory Visit (INDEPENDENT_AMBULATORY_CARE_PROVIDER_SITE_OTHER): Payer: Self-pay | Admitting: Interventional Cardiology

## 2016-11-29 VITALS — BP 122/78 | HR 70 | Ht 68.0 in | Wt 183.0 lb

## 2016-11-29 DIAGNOSIS — I25119 Atherosclerotic heart disease of native coronary artery with unspecified angina pectoris: Secondary | ICD-10-CM

## 2016-11-29 DIAGNOSIS — E785 Hyperlipidemia, unspecified: Secondary | ICD-10-CM

## 2016-11-29 DIAGNOSIS — I422 Other hypertrophic cardiomyopathy: Secondary | ICD-10-CM

## 2016-11-29 DIAGNOSIS — I10 Essential (primary) hypertension: Secondary | ICD-10-CM

## 2016-11-29 DIAGNOSIS — N182 Chronic kidney disease, stage 2 (mild): Secondary | ICD-10-CM

## 2016-11-29 DIAGNOSIS — I4430 Unspecified atrioventricular block: Secondary | ICD-10-CM

## 2016-11-29 LAB — HEPATIC FUNCTION PANEL
ALBUMIN: 4.2 g/dL (ref 3.5–5.5)
ALK PHOS: 85 IU/L (ref 39–117)
ALT: 13 IU/L (ref 0–44)
AST: 15 IU/L (ref 0–40)
Bilirubin Total: 0.4 mg/dL (ref 0.0–1.2)
Bilirubin, Direct: 0.12 mg/dL (ref 0.00–0.40)
TOTAL PROTEIN: 6.8 g/dL (ref 6.0–8.5)

## 2016-11-29 LAB — LIPID PANEL
Chol/HDL Ratio: 3.2 ratio (ref 0.0–5.0)
Cholesterol, Total: 138 mg/dL (ref 100–199)
HDL: 43 mg/dL (ref >39)
LDL CALC: 82 mg/dL (ref 0–99)
Triglycerides: 67 mg/dL (ref 0–149)
VLDL CHOLESTEROL CAL: 13 mg/dL (ref 5–40)

## 2016-11-29 NOTE — Patient Instructions (Signed)
Medication Instructions:  None  Labwork: None  Testing/Procedures: None  Follow-Up: Your physician wants you to follow-up in: 6-9 months with Dr. Smith.  You will receive a reminder letter in the mail two months in advance. If you don't receive a letter, please call our office to schedule the follow-up appointment.   Any Other Special Instructions Will Be Listed Below (If Applicable).     If you need a refill on your cardiac medications before your next appointment, please call your pharmacy.   

## 2016-11-29 NOTE — Telephone Encounter (Signed)
DPR for pt's wife who has been notified of lab results by phone for the pt. Pt's wife is aware to have pt continue all current medications. Pt's wife thanked me for my call today.

## 2016-11-29 NOTE — Telephone Encounter (Signed)
-----   Message from Beatrice Lecher, [place] sent at 11/29/2016  4:18 PM EDT ----- Please call patient. The LDL is at goal.   Other cholesterol parameters are at acceptable levels. The liver enzyme tests are normal. Continue with current treatment plan. Tereso Newcomer, PA-C 11/29/2016 4:18 PM  11/29/2016 4:18 PM

## 2017-04-02 ENCOUNTER — Other Ambulatory Visit: Payer: Self-pay | Admitting: Internal Medicine

## 2017-04-02 DIAGNOSIS — M5416 Radiculopathy, lumbar region: Secondary | ICD-10-CM

## 2017-04-04 NOTE — Telephone Encounter (Signed)
Faxed script back to [facility] pharmacy.../lmb  

## 2017-04-11 ENCOUNTER — Other Ambulatory Visit: Payer: Self-pay | Admitting: *Deleted

## 2017-04-11 MED ORDER — CLOPIDOGREL BISULFATE 75 MG PO TABS: 75.0000 mg | ORAL_TABLET | Freq: Every day | ORAL | 11 refills | Status: DC

## 2017-04-14 ENCOUNTER — Other Ambulatory Visit: Payer: Self-pay

## 2017-04-14 MED ORDER — IRBESARTAN 300 MG PO TABS: 300.0000 mg | ORAL_TABLET | Freq: Every day | ORAL | 8 refills | Status: DC

## 2017-06-29 ENCOUNTER — Other Ambulatory Visit: Payer: Self-pay | Admitting: Internal Medicine

## 2017-06-29 DIAGNOSIS — E519 Thiamine deficiency, unspecified: Secondary | ICD-10-CM

## 2017-07-04 ENCOUNTER — Other Ambulatory Visit: Payer: Self-pay | Admitting: Interventional Cardiology

## 2017-07-04 MED ORDER — HYDRALAZINE HCL 100 MG PO TABS: 100.0000 mg | ORAL_TABLET | Freq: Three times a day (TID) | ORAL | 5 refills | Status: DC

## 2017-09-06 ENCOUNTER — Encounter: Payer: Self-pay | Admitting: Gastroenterology

## 2017-11-03 ENCOUNTER — Other Ambulatory Visit: Payer: Self-pay | Admitting: Internal Medicine

## 2017-11-03 DIAGNOSIS — M5416 Radiculopathy, lumbar region: Secondary | ICD-10-CM

## 2018-03-14 ENCOUNTER — Other Ambulatory Visit: Payer: Self-pay | Admitting: Physician Assistant

## 2018-03-14 DIAGNOSIS — I25119 Atherosclerotic heart disease of native coronary artery with unspecified angina pectoris: Secondary | ICD-10-CM

## 2018-03-14 DIAGNOSIS — E785 Hyperlipidemia, unspecified: Secondary | ICD-10-CM

## 2018-03-16 ENCOUNTER — Other Ambulatory Visit: Payer: Self-pay

## 2018-03-16 MED ORDER — IRBESARTAN 300 MG PO TABS: 300.0000 mg | ORAL_TABLET | Freq: Every day | ORAL | 0 refills | Status: DC

## 2018-03-26 ENCOUNTER — Other Ambulatory Visit: Payer: Self-pay | Admitting: Interventional Cardiology

## 2018-03-26 IMAGING — DX DG CHEST 2V
2 series · 2 of 2 positions shown · non-contrast
Comparison: Chest radiograph dated 08/27/2014

CLINICAL DATA: 55-year-old male with chest pain

EXAM:
CHEST  2 VIEW

[chest pa]
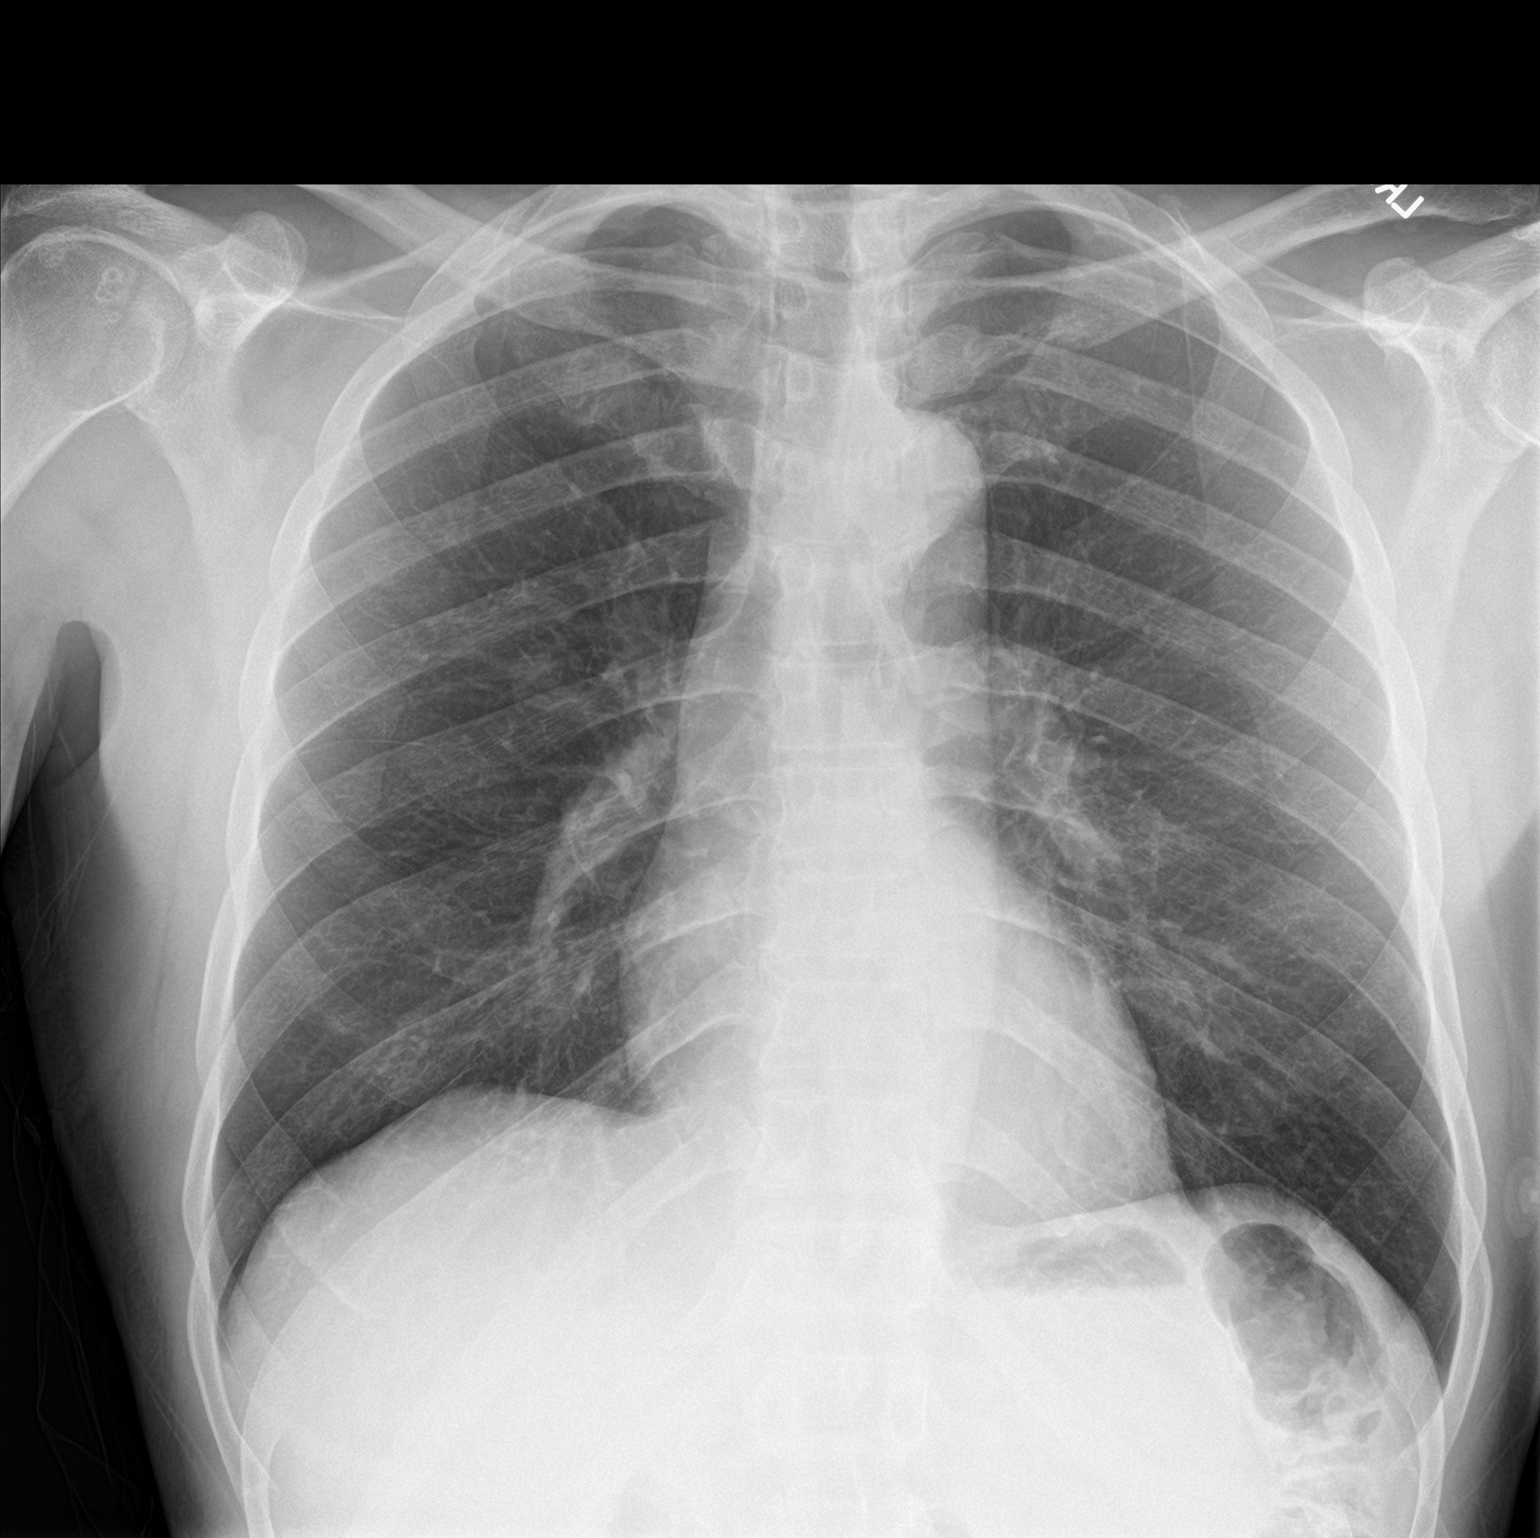

[chest lat]
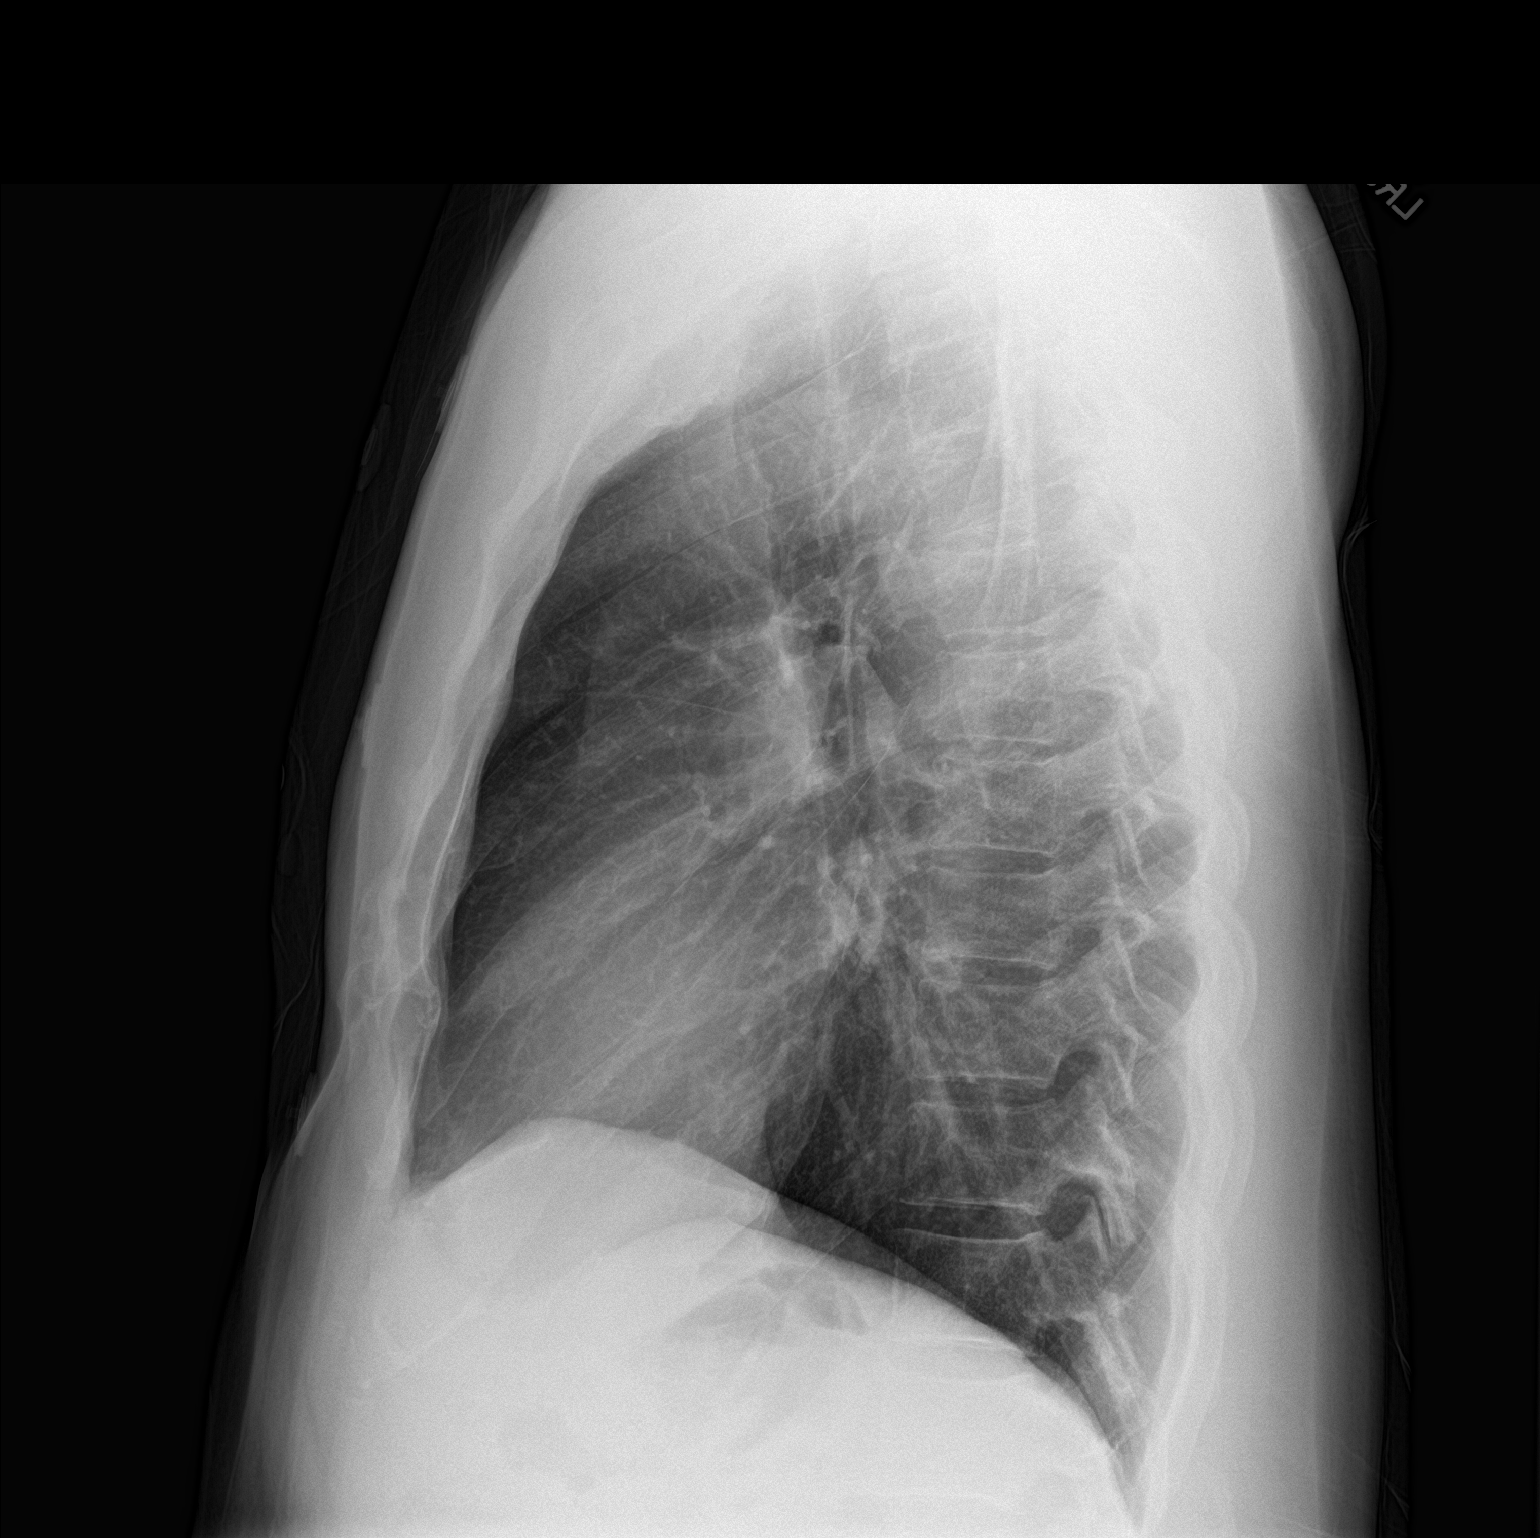

[2 of 2 positions shown; findings below may reference images not displayed]

FINDINGS: The heart size and mediastinal contours are within normal limits.
Both lungs are clear. The visualized skeletal structures are
unremarkable.
IMPRESSION: No active cardiopulmonary disease.

## 2018-03-28 ENCOUNTER — Other Ambulatory Visit: Payer: Self-pay | Admitting: *Deleted

## 2018-03-28 MED ORDER — IRBESARTAN 300 MG PO TABS: 300.0000 mg | ORAL_TABLET | Freq: Every day | ORAL | 0 refills | Status: DC

## 2018-04-03 ENCOUNTER — Ambulatory Visit (INDEPENDENT_AMBULATORY_CARE_PROVIDER_SITE_OTHER): Payer: Self-pay | Admitting: Interventional Cardiology

## 2018-04-03 ENCOUNTER — Encounter: Payer: Self-pay | Admitting: Interventional Cardiology

## 2018-04-03 VITALS — BP 158/100 | HR 82 | Ht 68.0 in | Wt 178.8 lb

## 2018-04-03 DIAGNOSIS — E785 Hyperlipidemia, unspecified: Secondary | ICD-10-CM

## 2018-04-03 DIAGNOSIS — I25119 Atherosclerotic heart disease of native coronary artery with unspecified angina pectoris: Secondary | ICD-10-CM

## 2018-04-03 DIAGNOSIS — I422 Other hypertrophic cardiomyopathy: Secondary | ICD-10-CM

## 2018-04-03 DIAGNOSIS — I10 Essential (primary) hypertension: Secondary | ICD-10-CM

## 2018-04-03 MED ORDER — LOSARTAN POTASSIUM 100 MG PO TABS: 100.0000 mg | ORAL_TABLET | Freq: Every day | ORAL | 3 refills | Status: DC

## 2018-04-03 NOTE — Patient Instructions (Signed)
Medication Instructions:  Your physician has recommended you make the following change in your medication:   1. STOP: irbesartan  2. START: losartan 100 mg tablet: Take 1 tablet by mouth once a day   If you need a refill on your cardiac medications before your next appointment, please call your pharmacy.   Lab work: Your physician recommends that you return for lab work in: 1 week for BMET  If you have labs (blood work) drawn today and your tests are completely normal, you will receive your results only by: Marland Kitchen MyChart Message (if you have MyChart) OR . A paper copy in the mail If you have any lab test that is abnormal or we need to change your treatment, we will call you to review the results.  Testing/Procedures: None ordered  Follow-Up: Your physician recommends that you schedule a follow-up appointment in: 1 month with an APP on Dr. Michaelle Copas team.   At [facility], you and your health needs are our priority.  As part of our continuing mission to provide you with exceptional heart care, we have created designated Provider Care Teams.  These Care Teams include your primary Cardiologist (physician) and Advanced Practice Providers (APPs -  Physician Assistants and Nurse Practitioners) who all work together to provide you with the care you need, when you need it. . You will need a follow up appointment in 1 year. Please call our office 2 months in advance to schedule this appointment.  You may see [name], MD or one of the following Advanced Practice Providers on your designated Care Team:   . Norma Fredrickson, NP . Nada Boozer, NP . Georgie Chard, NP   Any Other Special Instructions Will Be Listed Below (If Applicable).

## 2018-04-03 NOTE — Progress Notes (Signed)
Cardiology Office Note:    Date:  04/03/2018   ID:  [name], DOB 1960-06-16, MRN [id number]  PCP:  Etta Grandchild, MD  Cardiologist:  [name], MD   Referring MD: Etta Grandchild, MD   Chief Complaint  Patient presents with  . Coronary Artery Disease  . Hypertension    History of Present Illness:    [name] is a 57 y.o. male with a hx of HTN and CAD, who was admitted several weeks ago with an NSTEMI. The patient was treated with PCI to a large RI stenosis. During his hospital stay, he was noted to be bradycardic and his beta blocker was held.During the hospital stay he was noted to have bradycardia and continuous monitoring demonstrated high-grade AV block during sleep with greater than 5 second pauses. He was seen by Dr. Gilman Schmidt who felt that conservative management was the appropriate approach.  Difficulty affording his medications.  Having near continuous chest discomfort as he has had from time to time over the past 6 to 8 years.  Not influenced by physical activity.  Denies dyspnea.  Still able to work through   Past Medical History:  Diagnosis Date  . Apical variant hypertrophic cardiomyopathy (HCC)   . CAD (coronary artery disease)    a. LHC in 2011 with severe dz in D1/D2 and very distal LAD--> too small for PCI and managed medically. b.  LHC in 10/2013 after an abnormal nuclear study w/ non-obst dz/c. 01/2016 NSTEMI 2.5 x 38 mm Promus DES to Ramus    . Chronic back pain   . 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  Marland Kitchen HOH (hard of hearing)    rgiht ear  . Hyperlipidemia   . Hypertension   . Migraine headache   . Stroke [facility])     Past Surgical History:  Procedure Laterality Date    . CARDIAC CATHETERIZATION N/A 02/24/2016   Procedure: Left Heart Cath and Coronary Angiography;  Surgeon: Tonny Bollman, MD;  Location: [facility] INVASIVE CV LAB;  Service: Cardiovascular;  Laterality: N/A;  . CARDIAC CATHETERIZATION N/A 02/24/2016   Procedure: Coronary Stent Intervention;  Surgeon: Tonny Bollman, MD;  Location: [facility] INVASIVE CV LAB;  Service: Cardiovascular;  Laterality: N/A;  . INGUINAL HERNIA REPAIR    . LEFT HEART CATHETERIZATION WITH CORONARY ANGIOGRAM N/A 11/22/2013   Procedure: LEFT HEART CATHETERIZATION WITH CORONARY ANGIOGRAM;  Surgeon: [name], MD;  Location: [facility] CATH LAB;  Service: Cardiovascular;  Laterality: N/A;  . TEE WITHOUT CARDIOVERSION N/A 08/22/2015   Procedure: TRANSESOPHAGEAL ECHOCARDIOGRAM (TEE);  Surgeon: Vesta Mixer, MD;  Location: [facility] ENDOSCOPY;  Service: Cardiovascular;  Laterality: N/A;    Current Medications: Current Meds  Medication Sig  . amLODipine (NORVASC) 10 MG tablet TAKE 1 TABLET BY MOUTH EVERY DAY  . aspirin EC 81 MG EC tablet Take 1 tablet (81 mg total) by mouth daily.  . clopidogrel (PLAVIX) 75 MG tablet Take 1 tablet (75 mg total) daily with breakfast by mouth.  . [facility] B-1 100 MG tablet TAKE 1 TABLET BY MOUTH EVERY DAY  . hydrALAZINE (APRESOLINE) 100 MG tablet Take 1 tablet (100 mg total) by mouth 3 (three) times daily. Please make yearly appt  with Dr. Katrinka Blazing for July. Thank you  . irbesartan (AVAPRO) 300 MG tablet Take 1 tablet (300 mg total) by mouth daily. Keep November 2019 follow up appointment to receive further refills. Thank you.  . nitroGLYCERIN (NITROSTAT) 0.4 MG SL tablet Place 1 tablet (0.4 mg total) under the tongue every 5 (five) minutes as needed for chest pain.  . rosuvastatin (CRESTOR) 40 MG tablet Take 1 tablet (40 mg total) by mouth daily. Please keep upcoming appt in November with Dr. Katrinka Blazing for future refills. Thank you     Allergies:   Lisinopril   Social History   Socioeconomic History  . Marital status:  Married    Spouse name: Not on file  . Number of children: Not on file  . Years of education: Not on file  . Highest education level: Not on file  Occupational History  . Not on file  Social Needs  . Financial resource strain: Not on file  . Food insecurity:    Worry: Not on file    Inability: Not on file  . Transportation needs:    Medical: Not on file    Non-medical: Not on file  Tobacco Use  . Smoking status: Current Some Day Smoker    Packs/day: 0.00    Types: Cigarettes  . Smokeless tobacco: Never Used  Substance and Sexual Activity  . Alcohol use: No    Alcohol/week: 0.0 standard drinks  . Drug use: No  . Sexual activity: Not on file  Lifestyle  . Physical activity:    Days per week: Not on file    Minutes per session: Not on file  . Stress: Not on file  Relationships  . Social connections:    Talks on phone: Not on file    Gets together: Not on file    Attends religious service: Not on file    Active member of club or organization: Not on file    Attends meetings of clubs or organizations: Not on file    Relationship status: Not on file  Other Topics Concern  . Not on file  Social History Narrative  . Not on file     Family History: The patient's family history includes Colon cancer in his paternal uncle; Colon cancer (age of onset: 32) in his father; Heart failure in his mother; Hypertension in his father and mother.  ROS:   Please see the history of present illness.    Include unexplained weight loss, leg pain, headaches, difficulty sleeping, hearing loss, and anxiety.  All other systems reviewed and are negative.  EKGs/Labs/Other Studies Reviewed:    The following studies were reviewed today: No recent functional testing.  EKG:  EKG is  ordered today.  The ekg ordered today demonstrates LVH with strain pattern.  Normal sinus rhythm.  Recent Labs: No results found for requested labs within last 8760 hours.  Recent Lipid Panel    Component Value  Date/Time   CHOL 138 11/29/2016 0813   TRIG 67 11/29/2016 0813   HDL 43 11/29/2016 0813   CHOLHDL 3.2 11/29/2016 0813   CHOLHDL 4.0 02/25/2016 0341   VLDL 16 02/25/2016 0341   LDLCALC 82 11/29/2016 0813    Physical Exam:    VS:  BP (!) 158/100 Comment: pt has been off meds due to no insurance to buy them.  Pulse 82   Ht 5\' 8"  (1.727 m)   Wt 178 lb 12.8 oz (81.1 kg)   SpO2 99%   BMI 27.19 kg/m  Wt Readings from Last 3 Encounters:  04/03/18 178 lb 12.8 oz (81.1 kg)  11/29/16 183 lb (83 kg)  09/22/16 187 lb 8 oz (85 kg)     GEN:  Well nourished, well developed in no acute distress HEENT: Normal NECK: No JVD. LYMPHATICS: No lymphadenopathy CARDIAC: RRR, no murmur, no gallop, no  edema. VASCULAR: no  pulses. No  bruits. RESPIRATORY:  Clear to auscultation without rales, wheezing or rhonchi  ABDOMEN: Soft, non-tender, non-distended, No pulsatile mass, MUSCULOSKELETAL: No deformity  SKIN: Warm and dry NEUROLOGIC:  Alert and oriented x 3 PSYCHIATRIC:  Normal affect   ASSESSMENT:    1. 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 Apical variant hypertrophic cardiomyopathy (HCC)   3. Essential hypertension   4. Hyperlipidemia with target LDL less than 70    PLAN:    In order of problems listed above:  1. Current ongoing chest pain is nonischemic.  He has had this type discomfort in the past it is likely related to hypertrophic cardiomyopathy.  Better blood pressure control will help.  He has not been able to afford Irbesartan.  We will switch to losartan 100 mg/day.  Blood pressure follow-up (APP 4 weeks) and basic metabolic panel (1 week). 2. Needs better blood pressure control.  EKG is compatible with the diagnosis. 3. BP target under 140/90 mmHg.  Salt limitation is again discussed.  Blood pressures today are elevated but he has not been on ARB therapy now for 3 or 4 days.  This is due to a recall on  irbesartan. 4. He is unable to afford rosuvastatin for lipid therapy.  [facility] see if medications can be purchased more economically.  We will switch from current ARB to losartan which [facility]  Blood pressure follow-up in 4 weeks with APP.  Basic metabolic panel in 1 week.  May need low-dose HCTZ or Spironolactone on return if still elevated.  Clinical follow-up with me in 8 to 12 months.  Would need to have a echocardiogram performed some time between now and the next office visit.   Medication Adjustments/Labs and Tests Ordered: Current medicines are reviewed at length with the patient today.  Concerns regarding medicines are outlined above.  No orders of the defined types were placed in this encounter.  No orders of the defined types were placed in this encounter.   There are no Patient Instructions on file for this visit.   Signed, [name], MD  04/03/2018 11:42 AM    [facility]

## 2018-04-09 ENCOUNTER — Other Ambulatory Visit: Payer: Self-pay | Admitting: Interventional Cardiology

## 2018-04-10 ENCOUNTER — Other Ambulatory Visit: Payer: Self-pay | Admitting: *Deleted

## 2018-04-10 ENCOUNTER — Other Ambulatory Visit: Payer: Self-pay

## 2018-04-10 DIAGNOSIS — I10 Essential (primary) hypertension: Secondary | ICD-10-CM

## 2018-04-10 DIAGNOSIS — I25119 Atherosclerotic heart disease of native coronary artery with unspecified angina pectoris: Secondary | ICD-10-CM

## 2018-04-10 DIAGNOSIS — E785 Hyperlipidemia, unspecified: Secondary | ICD-10-CM

## 2018-04-10 DIAGNOSIS — I422 Other hypertrophic cardiomyopathy: Secondary | ICD-10-CM

## 2018-04-10 LAB — BASIC METABOLIC PANEL
BUN / CREAT RATIO: 8 — AB (ref 9–20)
BUN: 12 mg/dL (ref 6–24)
CHLORIDE: 103 mmol/L (ref 96–106)
CO2: 22 mmol/L (ref 20–29)
Calcium: 9.6 mg/dL (ref 8.7–10.2)
Creatinine, Ser: 1.43 mg/dL — ABNORMAL HIGH (ref 0.76–1.27)
GFR calc Af Amer: 63 mL/min/{1.73_m2} (ref >59)
GFR calc non Af Amer: 54 mL/min/{1.73_m2} — ABNORMAL LOW (ref >59)
GLUCOSE: 93 mg/dL (ref 65–99)
POTASSIUM: 4.3 mmol/L (ref 3.5–5.2)
SODIUM: 141 mmol/L (ref 134–144)

## 2018-04-11 MED ORDER — NITROGLYCERIN 0.4 MG SL SUBL: 0.4000 mg | SUBLINGUAL_TABLET | SUBLINGUAL | 3 refills | Status: DC | PRN

## 2018-04-18 ENCOUNTER — Other Ambulatory Visit: Payer: Self-pay | Admitting: Interventional Cardiology

## 2018-05-03 NOTE — Progress Notes (Signed)
Cardiology Office Note   Date:  05/04/2018   ID:  [name], DOB Oct 21, 1960, MRN [id number]  PCP:  Etta Grandchild, MD  Cardiologist:  Dr. Katrinka Blazing    Chief Complaint  Patient presents with  . Hypertension      History of Present Illness: [name] is a 57 y.o. male who presents for HTN  HTN and CAD, who was admitted 2017 with an NSTEMI. The patient was treated with PCI to a large RI stenosis. During his hospital stay, he was noted to be bradycardic and his beta blocker was held.During the hospital stay he was noted to have bradycardia and continuous monitoring demonstrated high-grade AV block during sleep with greater than 5 second pauses. He was seen by Dr. Gilman Schmidt who felt that conservative management was the appropriate approach.  Has had angina since ramus intermedius stenting in September 2017 during acute coronary syndrome. He had chest pain on last visit 04/03/18 thought to be due to hypertrophic cardiomyopathy.  Having difficulty affording meds   Follow up for BP today after change from valsartan to losartan.  Today pt admits to not taking his medications yesterday or today.  We discussed need and importance of medications.  Discussed places to put meds so he will remember.  He also indulges in salt and discussed decreasing.   Continues with chronic chest pain.    Past Medical History:  Diagnosis Date  . Apical variant hypertrophic cardiomyopathy (HCC)   . CAD (coronary artery disease)    a. LHC in 2011 with severe dz in D1/D2 and very distal LAD--> too small for PCI and managed medically. b.  LHC in 10/2013 after an abnormal nuclear study w/ non-obst dz/c. 01/2016 NSTEMI 2.5 x 38 mm Promus DES to Ramus    . Chronic back pain   . 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  Marland Kitchen HOH (hard of hearing)    rgiht ear  . Hyperlipidemia   . Hypertension   . Migraine headache   . Stroke [facility])     Past Surgical History:  Procedure Laterality Date  . CARDIAC CATHETERIZATION N/A 02/24/2016   Procedure: Left Heart Cath and Coronary Angiography;  Surgeon: Tonny Bollman, MD;  Location: [facility] INVASIVE CV LAB;  Service: Cardiovascular;  Laterality: N/A;  . CARDIAC CATHETERIZATION N/A 02/24/2016   Procedure: Coronary Stent Intervention;  Surgeon: Tonny Bollman, MD;  Location: [facility] INVASIVE CV LAB;  Service: Cardiovascular;  Laterality: N/A;  . INGUINAL HERNIA REPAIR    . LEFT HEART CATHETERIZATION WITH CORONARY ANGIOGRAM N/A 11/22/2013   Procedure: LEFT HEART CATHETERIZATION WITH CORONARY ANGIOGRAM;  Surgeon: [name], MD;  Location: [facility] CATH LAB;  Service: Cardiovascular;  Laterality: N/A;  . TEE WITHOUT CARDIOVERSION N/A 08/22/2015   Procedure: TRANSESOPHAGEAL ECHOCARDIOGRAM (TEE);  Surgeon: Vesta Mixer, MD;  Location: [facility] ENDOSCOPY;  Service: Cardiovascular;  Laterality: N/A;     Current Outpatient Medications  Medication Sig Dispense Refill  . amLODipine (NORVASC) 10 MG tablet TAKE 1 TABLET BY MOUTH EVERY DAY 30 tablet 1  . aspirin EC 81 MG EC tablet Take 1 tablet (81 mg total) by mouth daily.    . clopidogrel (PLAVIX) 75 MG tablet TAKE 1 TABLET BY MOUTH  EVERY DAY WITH BREAKFEST 30 tablet 11  . CVS B-1 100 MG tablet TAKE 1 TABLET BY MOUTH EVERY DAY 100 tablet 2  . hydrALAZINE (APRESOLINE) 100 MG tablet Take 1 tablet (100 mg total) by mouth 3 (three) times daily. Please make yearly appt with Dr. Katrinka Blazing for July. Thank you (Patient taking differently: Take 100 mg by mouth 3 (three) times daily. ) 90 tablet 5  . losartan (COZAAR) 100 MG tablet Take 1 tablet (100 mg total) by mouth daily. 90 tablet 3  . nitroGLYCERIN (NITROSTAT) 0.4 MG SL tablet Place 1 tablet (0.4 mg total) under the tongue every 5  (five) minutes as needed for chest pain. 25 tablet 3  . rosuvastatin (CRESTOR) 40 MG tablet Take 1 tablet (40 mg total) by mouth daily. Please keep upcoming appt in November with Dr. Katrinka Blazing for future refills. Thank you 90 tablet 0   No current facility-administered medications for this visit.     Allergies:   Lisinopril    Social History:  The patient  reports that he has been smoking cigarettes. He has been smoking about 0.00 packs per day. He has never used smokeless tobacco. He reports that he does not drink alcohol or use drugs.   Family History:  The patient's family history includes Colon cancer in his paternal uncle; Colon cancer (age of onset: 2) in his father; Heart failure in his mother; Hypertension in his father and mother.    ROS:  General:no colds or fevers, no weight changes CV:see HPI PUL:see HPI  Wt Readings from Last 3 Encounters:  05/04/18 182 lb (82.6 kg)  04/03/18 178 lb 12.8 oz (81.1 kg)  11/29/16 183 lb (83 kg)     PHYSICAL EXAM: VS:  BP (!) 162/92   Pulse 71   Ht 5\' 8"  (1.727 m)   Wt 182 lb (82.6 kg)   SpO2 97%   BMI 27.67 kg/m  , BMI Body mass index is 27.67 kg/m. General:Pleasant affect, NAD Heart:S1S2 RRR without murmur, gallup, rub or click Lungs:clear without rales, rhonchi, or wheezes    EKG:  EKG is NOTordered today.    Recent Labs: 04/10/2018: BUN 12; Creatinine, Ser 1.43; Potassium 4.3; Sodium 141    Lipid Panel    Component Value Date/Time   CHOL 138 11/29/2016 0813   TRIG 67 11/29/2016 0813   HDL 43 11/29/2016 0813   CHOLHDL 3.2 11/29/2016 0813   CHOLHDL 4.0 02/25/2016 0341   VLDL 16 02/25/2016 0341   LDLCALC 82 11/29/2016 0813       Other studies Reviewed: Additional studies/ records that were reviewed today include: see prior OV.   ASSESSMENT AND PLAN:  1.  HTN  Has not taken meds in 2 days.  We discussed importance of his meds. Return 1 week for BP check and then follow up with me in 1 month.   Current  medicines are reviewed with the patient today.  The patient Has no concerns regarding medicines.  The following changes have been made:  See above Labs/ tests ordered today include:see above  Disposition:   FU:  see above  Signed, Nada Boozer, NP  05/04/2018 10:00 AM    [facility] [address] [place], [place], [place]  [address]/ [address] [place], [place] Phone: [phone]; Fax: [phone]  [phone]

## 2018-05-04 ENCOUNTER — Encounter: Payer: Self-pay | Admitting: Cardiology

## 2018-05-04 ENCOUNTER — Ambulatory Visit (INDEPENDENT_AMBULATORY_CARE_PROVIDER_SITE_OTHER): Payer: Self-pay | Admitting: Cardiology

## 2018-05-04 VITALS — BP 162/92 | HR 71 | Ht 68.0 in | Wt 182.0 lb

## 2018-05-04 DIAGNOSIS — I10 Essential (primary) hypertension: Secondary | ICD-10-CM

## 2018-05-04 NOTE — Patient Instructions (Addendum)
Medication Instructions:  Your physician recommends that you continue on your current medications as directed. Please refer to the Current Medication list given to you today.  If you need a refill on your cardiac medications before your next appointment, please call your pharmacy.   Lab work: None ordered  If you have labs (blood work) drawn today and your tests are completely normal, you will receive your results only by: Marland Kitchen MyChart Message (if you have MyChart) OR . A paper copy in the mail If you have any lab test that is abnormal or we need to change your treatment, we will call you to review the results.  Testing/Procedures: None ordered  Follow-Up: Your physician recommends that you schedule a follow-up appointment in: 1 WEEK FOR NURSE VISIT FOR BLOOD PRESSURE CHECK   Your physician recommends that you schedule a follow-up appointment in: 1 MONTH WITH Nada Boozer, NP   At [facility], you and your health needs are our priority.  As part of our continuing mission to provide you with exceptional heart care, we have created designated Provider Care Teams.  These Care Teams include your primary Cardiologist (physician) and Advanced Practice Providers (APPs -  Physician Assistants and Nurse Practitioners) who all work together to provide you with the care you need, when you need it. You will need a follow up appointment in 12 months.  Please call our office 2 months in advance to schedule this appointment.  You may see [name], MD or one of the following Advanced Practice Providers on your designated Care Team:   Norma Fredrickson, NP Nada Boozer, NP . Georgie Chard, NP  Any Other Special Instructions Will Be Listed Below (If Applicable).

## 2018-05-10 ENCOUNTER — Encounter: Payer: Self-pay | Admitting: Cardiology

## 2018-05-12 ENCOUNTER — Ambulatory Visit: Payer: Self-pay

## 2018-05-16 ENCOUNTER — Ambulatory Visit: Payer: Self-pay

## 2018-06-04 NOTE — Progress Notes (Signed)
Cardiology Office Note   Date:  06/05/2018   ID:  [name], DOB [birth year], MRN [id number]  PCP:  Etta Grandchild, MD  Cardiologist:  Dr. Katrinka Blazing    Chief Complaint  Patient presents with  . Hypertension      History of Present Illness: [name] is a 58 y.o. male who presents for CAD.  HTN and CAD, who was admitted 2017 with an NSTEMI. The patient was treated with PCI to a large RI stenosis. During his hospital stay, he was noted to be bradycardic and his beta blocker was held.During the hospital stay he was noted to have bradycardia and continuous monitoring demonstrated high-grade AV block during sleep with greater than 5 second pauses. He was seen by Dr. Gilman Schmidt who felt that conservative management was the appropriate approach.  Has had angina since ramus intermedius stenting in September 2017 during acute coronary syndrome. He had chest pain on last visit 04/03/18 thought to be due to hypertrophic cardiomyopathy.  Having difficulty affording meds   Follow up for BP today after change from valsartan to losartan.  Today pt admits to not taking his medications yesterday or today.  We discussed need and importance of medications.  Discussed places to put meds so he will remember.  He also indulges in salt and discussed decreasing.   Continues with chronic chest pain.    On last visit he had not taken meds, back today for eval.   Past Medical History:  Diagnosis Date  . Apical variant hypertrophic cardiomyopathy (HCC)   . CAD (coronary artery disease)    a. LHC in 2011 with severe dz in D1/D2 and very distal LAD--> too small for PCI and managed medically. b.  LHC in 10/2013 after an abnormal nuclear study w/ non-obst dz/c. 01/2016 NSTEMI 2.5 x 38 mm Promus DES to Ramus    . Chronic back pain   . 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  Marland Kitchen HOH (hard of hearing)    rgiht ear  . Hyperlipidemia   . Hypertension   . Migraine headache   . Stroke [facility])     Past Surgical History:  Procedure Laterality Date  . CARDIAC CATHETERIZATION N/A 02/24/2016   Procedure: Left Heart Cath and Coronary Angiography;  Surgeon: Tonny Bollman, MD;  Location: [facility] INVASIVE CV LAB;  Service: Cardiovascular;  Laterality: N/A;  . CARDIAC CATHETERIZATION N/A 02/24/2016   Procedure: Coronary Stent Intervention;  Surgeon: Tonny Bollman, MD;  Location: [facility] INVASIVE CV LAB;  Service: Cardiovascular;  Laterality: N/A;  . INGUINAL HERNIA REPAIR    . LEFT HEART CATHETERIZATION WITH CORONARY ANGIOGRAM N/A 11/22/2013   Procedure: LEFT HEART CATHETERIZATION WITH CORONARY ANGIOGRAM;  Surgeon: [name], MD;  Location: [facility] CATH LAB;  Service: Cardiovascular;  Laterality: N/A;  . TEE WITHOUT CARDIOVERSION N/A 08/22/2015   Procedure: TRANSESOPHAGEAL ECHOCARDIOGRAM (TEE);  Surgeon: Vesta Mixer, MD;  Location: [facility] ENDOSCOPY;  Service: Cardiovascular;  Laterality: N/A;     Current Outpatient Medications  Medication Sig Dispense Refill  . amLODipine (NORVASC) 10 MG tablet TAKE 1 TABLET BY MOUTH EVERY DAY 30 tablet 1  . aspirin EC 81 MG EC tablet Take 1 tablet (81 mg total) by mouth daily.    Marland Kitchen  clopidogrel (PLAVIX) 75 MG tablet TAKE 1 TABLET BY MOUTH EVERY DAY WITH BREAKFEST 30 tablet 11  . CVS B-1 100 MG tablet TAKE 1 TABLET BY MOUTH EVERY DAY 100 tablet 2  . hydrALAZINE (APRESOLINE) 100 MG tablet Take 100 mg by mouth 3 (three) times daily.    Marland Kitchen losartan (COZAAR) 100 MG tablet Take 1 tablet (100 mg total) by mouth daily. 90 tablet 3  . nitroGLYCERIN (NITROSTAT) 0.4 MG SL tablet Place 1 tablet (0.4 mg total) under the tongue every 5 (five) minutes as needed for chest pain. 25 tablet 3  . rosuvastatin (CRESTOR) 40 MG  tablet Take 1 tablet (40 mg total) by mouth daily. Please keep upcoming appt in November with Dr. Katrinka Blazing for future refills. Thank you 90 tablet 0   No current facility-administered medications for this visit.     Allergies:   Lisinopril    Social History:  The patient  reports that he has been smoking cigarettes. He has been smoking about 0.00 packs per day. He has never used smokeless tobacco. He reports that he does not drink alcohol or use drugs.   Family History:  The patient's family history includes Colon cancer in his paternal uncle; Colon cancer (age of onset: 37) in his father; Heart failure in his mother; Hypertension in his father and mother.    ROS:  General:no colds or fevers, no weight changes CV:see HPI PUL:see HPI GI:no diarrhea constipation or melena, no indigestion Neuro:no syncope, no lightheadedness   Wt Readings from Last 3 Encounters:  06/05/18 177 lb 12.8 oz (80.6 kg)  05/04/18 182 lb (82.6 kg)  04/03/18 178 lb 12.8 oz (81.1 kg)     PHYSICAL EXAM: VS:  BP 134/82   Pulse 69   Ht 5\' 8"  (1.727 m)   Wt 177 lb 12.8 oz (80.6 kg)   SpO2 99%   BMI 27.03 kg/m  , BMI Body mass index is 27.03 kg/m. General:Pleasant affect, NAD Skin:Warm and dry, brisk capillary refill Heart:S1S2 RRR without murmur, gallup, rub or click Lungs:clear without rales, rhonchi, or wheezes Neuro:alert and oriented X 3,   EKG:  EKG is NOT ordered today.    Recent Labs: 04/10/2018: BUN 12; Creatinine, Ser 1.43; Potassium 4.3; Sodium 141    Lipid Panel    Component Value Date/Time   CHOL 138 11/29/2016 0813   TRIG 67 11/29/2016 0813   HDL 43 11/29/2016 0813   CHOLHDL 3.2 11/29/2016 0813   CHOLHDL 4.0 02/25/2016 0341   VLDL 16 02/25/2016 0341   LDLCALC 82 11/29/2016 0813       Other studies Reviewed: Additional studies/ records that were reviewed today include: . No changes  ASSESSMENT AND PLAN:  1. HTN pt here for BP check he had missed several doses on last  visit.  Now taking and BP controlled.  Target 140/90 or less.  He watches his salt.    2.  Hypertrophic cardiomyopathy - will recheck Echo in 6 months and follow with Dr. Katrinka Blazing in 8 months.    Current medicines are reviewed with the patient today.  The patient Has no concerns regarding medicines.  The following changes have been made:  See above Labs/ tests ordered today include:see above  Disposition:   FU:  see above  Signed, Nada Boozer, NP  06/05/2018 10:58 AM    [facility] [address] [place], [place], [place]  [address]/ [address] [place], [place] Phone: [phone]; Fax: [phone]  [phone] 

## 2018-06-05 ENCOUNTER — Ambulatory Visit (INDEPENDENT_AMBULATORY_CARE_PROVIDER_SITE_OTHER): Payer: Self-pay | Admitting: Cardiology

## 2018-06-05 ENCOUNTER — Encounter: Payer: Self-pay | Admitting: Cardiology

## 2018-06-05 VITALS — BP 134/82 | HR 69 | Ht 68.0 in | Wt 177.8 lb

## 2018-06-05 DIAGNOSIS — I422 Other hypertrophic cardiomyopathy: Secondary | ICD-10-CM

## 2018-06-05 DIAGNOSIS — I10 Essential (primary) hypertension: Secondary | ICD-10-CM

## 2018-06-05 NOTE — Patient Instructions (Addendum)
Medication Instructions:  Your physician recommends that you continue on your current medications as directed. Please refer to the Current Medication list given to you today.  If you need a refill on your cardiac medications before your next appointment, please call your pharmacy.   Lab work: None ordered  If you have labs (blood work) drawn today and your tests are completely normal, you will receive your results only by: Marland Kitchen MyChart Message (if you have MyChart) OR . A paper copy in the mail If you have any lab test that is abnormal or we need to change your treatment, we will call you to review the results.  Testing/Procedures: Your physician has requested that you have an echocardiogram IN 6 MONTHS.Marland Kitchen Echocardiography is a painless test that uses sound waves to create images of your heart. It provides your doctor with information about the size and shape of your heart and how well your heart's chambers and valves are working. This procedure takes approximately one hour. There are no restrictions for this procedure.    Follow-Up: At [facility], you and your health needs are our priority.  As part of our continuing mission to provide you with exceptional heart care, we have created designated Provider Care Teams.  These Care Teams include your primary Cardiologist (physician) and Advanced Practice Providers (APPs -  Physician Assistants and Nurse Practitioners) who all work together to provide you with the care you need, when you need it. You will need a follow up appointment in 8 months.  Please call our office 2 months in advance to schedule this appointment.  You may see [name], MD or one of the following Advanced Practice Providers on your designated Care Team:   Norma Fredrickson, NP Nada Boozer, NP . Georgie Chard, NP  Any Other Special Instructions Will Be Listed Below (If Applicable).   Echocardiogram An echocardiogram is a procedure that uses painless sound waves  (ultrasound) to produce an image of the heart. Images from an echocardiogram can provide important information about:  Signs of coronary artery disease (CAD).  Aneurysm detection. An aneurysm is a weak or damaged part of an artery wall that bulges out from the normal force of blood pumping through the body.  Heart size and shape. Changes in the size or shape of the heart can be associated with certain conditions, including heart failure, aneurysm, and CAD.  Heart muscle function.  Heart valve function.  Signs of a past heart attack.  Fluid buildup around the heart.  Thickening of the heart muscle.  A tumor or infectious growth around the heart valves. Tell a health care provider about:  Any allergies you have.  All medicines you are taking, including vitamins, herbs, eye drops, creams, and over-the-counter medicines.  Any blood disorders you have.  Any surgeries you have had.  Any medical conditions you have.  Whether you are pregnant or may be pregnant. What are the risks? Generally, this is a safe procedure. However, problems may occur, including:  Allergic reaction to dye (contrast) that may be used during the procedure. What happens before the procedure? No specific preparation is needed. You may eat and drink normally. What happens during the procedure?   An IV tube may be inserted into one of your veins.  You may receive contrast through this tube. A contrast is an injection that improves the quality of the pictures from your heart.  A gel will be applied to your chest.  A wand-like tool (transducer) will  be moved over your chest. The gel will help to transmit the sound waves from the transducer.  The sound waves will harmlessly bounce off of your heart to allow the heart images to be captured in real-time motion. The images will be recorded on a computer. The procedure may vary among health care providers and hospitals. What happens after the  procedure?  You may return to your normal, everyday life, including diet, activities, and medicines, unless your health care provider tells you not to do that. Summary  An echocardiogram is a procedure that uses painless sound waves (ultrasound) to produce an image of the heart.  Images from an echocardiogram can provide important information about the size and shape of your heart, heart muscle function, heart valve function, and fluid buildup around your heart.  You do not need to do anything to prepare before this procedure. You may eat and drink normally.  After the echocardiogram is completed, you may return to your normal, everyday life, unless your health care provider tells you not to do that. This information is not intended to replace advice given to you by your health care provider. Make sure you discuss any questions you have with your health care provider. Document Released: 05/14/2000 Document Revised: 06/19/2016 Document Reviewed: 06/19/2016 Elsevier Interactive Patient Education  2019 [employer].

## 2018-07-20 ENCOUNTER — Other Ambulatory Visit: Payer: Self-pay | Admitting: *Deleted

## 2018-07-20 MED ORDER — HYDRALAZINE HCL 100 MG PO TABS: 100.0000 mg | ORAL_TABLET | Freq: Three times a day (TID) | ORAL | 3 refills | Status: DC

## 2018-08-14 ENCOUNTER — Other Ambulatory Visit: Payer: Self-pay | Admitting: Interventional Cardiology

## 2018-10-18 ENCOUNTER — Encounter: Payer: Self-pay | Admitting: Internal Medicine

## 2018-10-18 ENCOUNTER — Other Ambulatory Visit (INDEPENDENT_AMBULATORY_CARE_PROVIDER_SITE_OTHER): Payer: Self-pay

## 2018-10-18 ENCOUNTER — Other Ambulatory Visit: Payer: Self-pay

## 2018-10-18 ENCOUNTER — Ambulatory Visit (INDEPENDENT_AMBULATORY_CARE_PROVIDER_SITE_OTHER)
Admission: RE | Admit: 2018-10-18 | Discharge: 2018-10-18 | Disposition: A | Discharge status: Home / Self Care | Payer: Self-pay | Source: Ambulatory Visit | Attending: Internal Medicine | Admitting: Internal Medicine

## 2018-10-18 ENCOUNTER — Ambulatory Visit (INDEPENDENT_AMBULATORY_CARE_PROVIDER_SITE_OTHER): Payer: Self-pay | Admitting: Internal Medicine

## 2018-10-18 VITALS — BP 212/110 | HR 58 | Temp 98.3°F | Resp 16 | Ht 68.0 in | Wt 188.2 lb

## 2018-10-18 DIAGNOSIS — I10 Essential (primary) hypertension: Secondary | ICD-10-CM

## 2018-10-18 DIAGNOSIS — E519 Thiamine deficiency, unspecified: Secondary | ICD-10-CM

## 2018-10-18 DIAGNOSIS — N2889 Other specified disorders of kidney and ureter: Secondary | ICD-10-CM

## 2018-10-18 DIAGNOSIS — E785 Hyperlipidemia, unspecified: Secondary | ICD-10-CM

## 2018-10-18 DIAGNOSIS — Z800 Family history of malignant neoplasm of digestive organs: Secondary | ICD-10-CM

## 2018-10-18 DIAGNOSIS — M25511 Pain in right shoulder: Secondary | ICD-10-CM

## 2018-10-18 DIAGNOSIS — I422 Other hypertrophic cardiomyopathy: Secondary | ICD-10-CM

## 2018-10-18 DIAGNOSIS — N182 Chronic kidney disease, stage 2 (mild): Secondary | ICD-10-CM

## 2018-10-18 DIAGNOSIS — D539 Nutritional anemia, unspecified: Secondary | ICD-10-CM

## 2018-10-18 DIAGNOSIS — I25119 Atherosclerotic heart disease of native coronary artery with unspecified angina pectoris: Secondary | ICD-10-CM

## 2018-10-18 DIAGNOSIS — Z0000 Encounter for general adult medical examination without abnormal findings: Secondary | ICD-10-CM

## 2018-10-18 LAB — URINALYSIS, ROUTINE W REFLEX MICROSCOPIC
Bilirubin Urine: NEGATIVE
Hgb urine dipstick: NEGATIVE
Ketones, ur: NEGATIVE
Leukocytes,Ua: NEGATIVE
Nitrite: NEGATIVE
RBC / HPF: NONE SEEN (ref 0–?)
Specific Gravity, Urine: 1.02 (ref 1.000–1.030)
Total Protein, Urine: NEGATIVE
Urine Glucose: NEGATIVE
Urobilinogen, UA: 0.2 (ref 0.0–1.0)
WBC, UA: NONE SEEN (ref 0–?)
pH: 7 (ref 5.0–8.0)

## 2018-10-18 LAB — CBC WITH DIFFERENTIAL/PLATELET
Basophils Absolute: 0 10*3/uL (ref 0.0–0.1)
Basophils Relative: 0.6 % (ref 0.0–3.0)
Eosinophils Absolute: 0.2 10*3/uL (ref 0.0–0.7)
Eosinophils Relative: 2.8 % (ref 0.0–5.0)
HCT: 37.6 % — ABNORMAL LOW (ref 39.0–52.0)
Hemoglobin: 12.5 g/dL — ABNORMAL LOW (ref 13.0–17.0)
Lymphocytes Relative: 31.4 % (ref 12.0–46.0)
Lymphs Abs: 1.7 10*3/uL (ref 0.7–4.0)
MCHC: 33.2 g/dL (ref 30.0–36.0)
MCV: 91 fl (ref 78.0–100.0)
Monocytes Absolute: 0.4 10*3/uL (ref 0.1–1.0)
Monocytes Relative: 7.1 % (ref 3.0–12.0)
Neutro Abs: 3.2 10*3/uL (ref 1.4–7.7)
Neutrophils Relative %: 58.1 % (ref 43.0–77.0)
Platelets: 271 10*3/uL (ref 150.0–400.0)
RBC: 4.13 Mil/uL — ABNORMAL LOW (ref 4.22–5.81)
RDW: 13 % (ref 11.5–15.5)
WBC: 5.5 10*3/uL (ref 4.0–10.5)

## 2018-10-18 LAB — LIPID PANEL
Cholesterol: 192 mg/dL (ref 0–200)
HDL: 51.2 mg/dL (ref >39.00)
LDL Cholesterol: 130 mg/dL — ABNORMAL HIGH (ref 0–99)
NonHDL: 140.43
Total CHOL/HDL Ratio: 4
Triglycerides: 51 mg/dL (ref 0.0–149.0)
VLDL: 10.2 mg/dL (ref 0.0–40.0)

## 2018-10-18 LAB — IBC PANEL
Iron: 106 ug/dL (ref 42–165)
Saturation Ratios: 37.9 % (ref 20.0–50.0)
Transferrin: 200 mg/dL — ABNORMAL LOW (ref 212.0–360.0)

## 2018-10-18 LAB — TSH: TSH: 1.44 u[IU]/mL (ref 0.35–4.50)

## 2018-10-18 LAB — FERRITIN: Ferritin: 76.1 ng/mL (ref 22.0–322.0)

## 2018-10-18 LAB — FOLATE: Folate: 11.6 ng/mL (ref >5.9)

## 2018-10-18 LAB — VITAMIN B12: Vitamin B-12: 304 pg/mL (ref 211–911)

## 2018-10-18 LAB — PSA: PSA: 3.12 ng/mL (ref 0.10–4.00)

## 2018-10-18 MED ORDER — AMLODIPINE BESYLATE 10 MG PO TABS: 10.0000 mg | ORAL_TABLET | Freq: Every day | ORAL | 1 refills | Status: DC

## 2018-10-18 MED ORDER — LOSARTAN POTASSIUM 100 MG PO TABS: 100.0000 mg | ORAL_TABLET | Freq: Every day | ORAL | 1 refills | Status: DC

## 2018-10-18 MED ORDER — ROSUVASTATIN CALCIUM 40 MG PO TABS: 40.0000 mg | ORAL_TABLET | Freq: Every day | ORAL | 1 refills | Status: DC

## 2018-10-18 MED ORDER — HYDRALAZINE HCL 100 MG PO TABS: 100.0000 mg | ORAL_TABLET | Freq: Three times a day (TID) | ORAL | 1 refills | Status: DC

## 2018-10-18 MED ORDER — ASPIRIN 81 MG PO TBEC: 81.0000 mg | DELAYED_RELEASE_TABLET | Freq: Every day | ORAL | 1 refills | Status: DC

## 2018-10-18 MED ORDER — INDAPAMIDE 2.5 MG PO TABS: 2.5000 mg | ORAL_TABLET | Freq: Every day | ORAL | 0 refills | Status: DC

## 2018-10-18 NOTE — Patient Instructions (Signed)

## 2018-10-18 NOTE — Progress Notes (Signed)
Subjective:  Patient ID: [name], male    DOB: [birth year]  Age: 58 y.o. MRN: [id number]  CC: Annual Exam; Hypertension; and Hyperlipidemia   HPI [name] presents for a CPX.   He does not monitor his blood pressure.  He ran out of his antihypertensives several weeks ago.  He complains of a mild headache but denies blurred vision, CP, DOE, palpitations, edema, or fatigue.  He also complains of a several month history of nontraumatic right shoulder pain.  Outpatient Medications Prior to Visit  Medication Sig Dispense Refill  . clopidogrel (PLAVIX) 75 MG tablet TAKE 1 TABLET BY MOUTH EVERY DAY WITH BREAKFEST 30 tablet 11  . [facility]S B-1 100 MG tablet TAKE 1 TABLET BY MOUTH EVERY DAY 100 tablet 2  . amLODipine (NORVASC) 10 MG tablet TAKE 1 TABLET BY MOUTH EVERY DAY 30 tablet 10  . aspirin EC 81 MG EC tablet Take 1 tablet (81 mg total) by mouth daily.    . hydrALAZINE (APRESOLINE) 100 MG tablet Take 1 tablet (100 mg total) by mouth 3 (three) times daily. 270 tablet 3  . rosuvastatin (CRESTOR) 40 MG tablet Take 1 tablet (40 mg total) by mouth daily. Please keep upcoming appt in November with Dr. Katrinka Blazing for future refills. Thank you 90 tablet 0  . nitroGLYCERIN (NITROSTAT) 0.4 MG SL tablet Place 1 tablet (0.4 mg total) under the tongue every 5 (five) minutes as needed for chest pain. 25 tablet 3  . losartan (COZAAR) 100 MG tablet Take 1 tablet (100 mg total) by mouth daily. 90 tablet 3   No facility-administered medications prior to visit.     ROS Review of Systems  Objective:  BP (!) 212/110 (BP Location: Left Arm, Patient Position: Sitting, Cuff Size: Normal)   Pulse (!) 58   Temp 98.3 F (36.8 C) (Oral)   Resp 16   Ht  (1.727 m)   Wt 188 lb 4 oz (85.4 kg)   SpO2 99%   BMI 28.62 kg/m   BP Readings from Last 3 Encounters:  10/18/18 (!) 212/110  06/05/18 134/82  05/04/18 (!) 162/92    Wt Readings from Last 3 Encounters:  10/18/18 188 lb 4 oz (85.4 kg)   06/05/18 177 lb 12.8 oz (80.6 kg)  05/04/18 182 lb (82.6 kg)    Physical Exam Vitals signs reviewed.  Constitutional:      Appearance: He is obese. He is not ill-appearing or diaphoretic.  HENT:     Nose: Nose normal.     Mouth/Throat:     Mouth: Mucous membranes are moist.     Pharynx: No oropharyngeal exudate or posterior oropharyngeal erythema.  Eyes:     General: No scleral icterus.    Conjunctiva/sclera: Conjunctivae normal.  Neck:     Musculoskeletal: Normal range of motion and neck supple. No neck rigidity.  Cardiovascular:     Rate and Rhythm: Normal rate and regular rhythm.     Heart sounds: No murmur. No gallop.      Comments: EKG ----  Sinus  Bradycardia  Voltage criteria for LVH  (S(V1)+R(V6) exceeds 3.50 mV).   -  Extensive T-abnormality  - Anterior/lateral and inferior ischemia.   ABNORMAL - no change from the prior EKG  Pulmonary:     Effort: Pulmonary effort is normal.     Breath sounds: No stridor. No wheezing, rhonchi or rales.  Abdominal:     General: Abdomen is flat.     Palpations: There is no hepatomegaly,  splenomegaly or mass.     Tenderness: There is no abdominal tenderness. There is no guarding.     Hernia: No hernia is present. There is no hernia in the right inguinal area or left inguinal area.  Genitourinary:    Pubic Area: No rash.      Penis: Normal and uncircumcised. No phimosis, paraphimosis, hypospadias, erythema, tenderness, discharge, swelling or lesions.      Scrotum/Testes: Normal.        Right: Mass or tenderness not present.        Left: Mass or tenderness not present.     Epididymis:     Right: Normal.     Left: Normal.     Prostate: Normal. Not enlarged, not tender and no nodules present.     Rectum: Guaiac result negative. External hemorrhoid present. No mass, tenderness, anal fissure or internal hemorrhoid. Normal anal tone.  Musculoskeletal: Normal range of motion.        General: No swelling.     Right lower leg: No  edema.     Left lower leg: No edema.  Lymphadenopathy:     Cervical: No cervical adenopathy.     Lower Body: No right inguinal adenopathy. No left inguinal adenopathy.  Skin:    General: Skin is warm.     Coloration: Skin is not pale.  Neurological:     General: No focal deficit present.  Psychiatric:        Mood and Affect: Mood normal.        Behavior: Behavior normal.     Lab Results  Component Value Date   WBC 5.5 10/18/2018   HGB 12.5 (L) 10/18/2018   HCT 37.6 (L) 10/18/2018   PLT 271.0 10/18/2018   GLUCOSE 79 10/19/2018   CHOL 192 10/18/2018   TRIG 51.0 10/18/2018   HDL 51.20 10/18/2018   LDLCALC 130 (H) 10/18/2018   ALT 13 11/29/2016   AST 15 11/29/2016   NA 141 10/19/2018   K 4.3 10/19/2018   CL 106 10/19/2018   CREATININE 1.30 10/19/2018   BUN 16 10/19/2018   CO2 24 10/19/2018   TSH 1.44 10/18/2018   PSA 3.12 10/18/2018   INR 1.06 02/24/2016   HGBA1C  09/17/2009    5.1 (NOTE)                                                                       According to the ADA Clinical Practice Recommendations for 2011, when HbA1c is used as a screening test:   >=6.5%   Diagnostic of Diabetes Mellitus           (if abnormal result  is confirmed)  5.7-6.4%   Increased risk of developing Diabetes Mellitus  References:Diagnosis and Classification of Diabetes Mellitus,Diabetes Care,2011,34(Suppl 1):S62-S69 and Standards of Medical Care in         Diabetes - 2011,Diabetes Care,2011,34  (Suppl 1):S11-S61.    Mr Lumbar Spine Wo Contrast  Result Date: 09/27/2016 CLINICAL DATA:  58 year old male. Lumbar back pain with bilateral leg pain and numbness for 2 months left worse than right. No specific injury known. Left lumbar radiculitis. EXAM: MRI LUMBAR SPINE WITHOUT CONTRAST TECHNIQUE: Multiplanar, multisequence MR imaging of the lumbar spine was performed. No intravenous contrast  was administered. COMPARISON:  Lumbar radiographs 09/05/2016. CT Abdomen and Pelvis 10/06/2014  FINDINGS: Segmentation:  Normal as seen on the recent radiographs. Alignment: Straightening of lumbar lordosis. Subtle retrolisthesis of L5 on S1 is stable since 2016. Vertebrae: Minimal degenerative appearing posterosuperior endplate marrow edema at L5. See L4-L5 findings below. No other marrow edema or acute osseous abnormality. Other Visualized bone marrow signal is within normal limits. Negative visible sacrum and SI joints. Conus medullaris: Extends to the L1-L2 level and appears normal. Paraspinal and other soft tissues: Negative. Disc levels: T11-T12:  Negative. T12-L1:  Negative. L1-L2:  Negative disc.  Mild facet hypertrophy. L2-L3: Minimal to mild circumferential disc bulge. Mild facet hypertrophy on the left. No stenosis. L3-L4: Minimal to mild disc bulge. Mild endplate spurring. No significant stenosis. L4-L5: Mild disc space loss with circumferential disc bulge. Broad-based posterior component of disc. Superimposed T2 and STIR heterogeneous caudal disc extrusion into the left lateral recess. Sequestered disc fragment encompassing 10 x 13 x 18 mm (AP by transverse by CC) tracks along the course of the descending and proximal exiting left L5 nerve. Superimposed moderate to severe facet and ligament flavum hypertrophy greater on the left. Trace left facet joint fluid. At the disc space level there is mild to moderate spinal stenosis. There is no significant L4 foraminal stenosis. L5-S1: Mild disc desiccation and disc space loss with circumferential disc bulge and endplate spurring. Broad-based posterior component. Mild to moderate facet and ligament flavum hypertrophy greater on the right. No spinal or lateral recess stenosis at this level. No additional left L5 foraminal stenosis superimposed on the caudal disc extrusion described above. There is moderate to severe chronic appearing right L5 foraminal stenosis, stable since the 2016 CT. IMPRESSION: 1. Symptomatic level felt to be L4-L5 where a bulky  caudal disc extrusion into the left lateral recess with sequestered disc fragment severely involves of the descending and proximal exiting left L5 nerve. Correlate for corresponding radiculitis. Superimposed multifactorial mild to moderate spinal stenosis at the L4-L5 disc space level. No significant L4 foraminal stenosis. 2. Chronic disc, endplate, and posterior element degeneration at L5-S1 greater on the right. Chronic appearing right L5 neural foraminal stenosis. Electronically Signed   By: Odessa FlemingH  Hall M.D.   On: 09/27/2016 17:01   No results found.  Assessment & Plan:   [name] Serverrnest was seen today for annual exam, hypertension and hyperlipidemia.  Diagnoses and all orders for this visit:  Essential hypertension - His blood pressure is not adequately well controlled and he is mildly symptomatic with a headache.  He has LVH on EKG.  I have asked him to restart his previous antihypertensives and to add on a thiazide diuretic as well. -     EKG 12-Lead -     indapamide (LOZOL) 2.5 MG tablet; Take 1 tablet (2.5 mg total) by mouth daily. -     Urinalysis, Routine w reflex microscopic; Future -     TSH; Future -     losartan (COZAAR) 100 MG tablet; Take 1 tablet (100 mg total) by mouth daily. -     hydrALAZINE (APRESOLINE) 100 MG tablet; Take 1 tablet (100 mg total) by mouth 3 (three) times daily. -     amLODipine (NORVASC) 10 MG tablet; Take 1 tablet (10 mg total) by mouth daily. -     Basic metabolic panel; Future  Chronic renal insufficiency, stage II (mild)- His renal function has improved.  I have asked him to avoid nephrotoxic agents and to try to gain  better control of his blood pressure. -     Urinalysis, Routine w reflex microscopic; Future -     Basic metabolic panel; Future  Deficiency anemia- His H&H remain mildly low.  I will screen him for vitamin deficiencies.  He has been anemic for several years so this is more likely to be a hemoglobinopathy.  If his vitamin levels are normal he then I  will evaluate him for hemoglobinopathy. -     CBC with Differential/Platelet; Future -     Vitamin B1; Future -     Folate; Future -     Ferritin; Future -     Vitamin B12; Future -     IBC panel; Future  Thiamine deficiency -     Vitamin B1; Future  Routine general medical examination at a health care facility- Exam completed, labs reviewed - His PSA has increased over the last year or so I have asked him to return in 3 to 4 months to recheck his PSA and to not ejaculate for at least 1 week prior to the testing, vaccines reviewed, he is referred for follow-up colonoscopy, patient education material was given. -     Lipid panel; Future -     Hepatitis C antibody; Future -     HIV Antibody (routine testing w rflx); Future -     PSA; Future  Apical variant hypertrophic cardiomyopathy (HCC) -     losartan (COZAAR) 100 MG tablet; Take 1 tablet (100 mg total) by mouth daily. -     hydrALAZINE (APRESOLINE) 100 MG tablet; Take 1 tablet (100 mg total) by mouth 3 (three) times daily.  Acute pain of right shoulder -     DG Shoulder Right; Future -     Ambulatory referral to Sports Medicine  Coronary artery disease involving native coronary artery of native heart with angina pectoris (HCC) -     rosuvastatin (CRESTOR) 40 MG tablet; Take 1 tablet (40 mg total) by mouth daily. -     aspirin 81 MG EC tablet; Take 1 tablet (81 mg total) by mouth daily.  Hyperlipidemia, unspecified hyperlipidemia type -     rosuvastatin (CRESTOR) 40 MG tablet; Take 1 tablet (40 mg total) by mouth daily.  Hyperlipidemia with target LDL less than 70- He has not achieved his LDL goal so of asked him to restart the statin.  Family history of colon cancer -     Ambulatory referral to Gastroenterology   I have changed [name]'s amLODipine, rosuvastatin, and aspirin. I am also having him start on indapamide. Additionally, I am having him maintain his CVS B-1, nitroGLYCERIN, clopidogrel, losartan, and  hydrALAZINE.  Meds ordered this encounter  Medications  . indapamide (LOZOL) 2.5 MG tablet    Sig: Take 1 tablet (2.5 mg total) by mouth daily.    Dispense:  90 tablet    Refill:  0  . losartan (COZAAR) 100 MG tablet    Sig: Take 1 tablet (100 mg total) by mouth daily.    Dispense:  90 tablet    Refill:  1  . hydrALAZINE (APRESOLINE) 100 MG tablet    Sig: Take 1 tablet (100 mg total) by mouth 3 (three) times daily.    Dispense:  270 tablet    Refill:  1  . amLODipine (NORVASC) 10 MG tablet    Sig: Take 1 tablet (10 mg total) by mouth daily.    Dispense:  90 tablet    Refill:  1  .  rosuvastatin (CRESTOR) 40 MG tablet    Sig: Take 1 tablet (40 mg total) by mouth daily.    Dispense:  90 tablet    Refill:  1  . aspirin 81 MG EC tablet    Sig: Take 1 tablet (81 mg total) by mouth daily.    Dispense:  90 tablet    Refill:  1     Follow-up: Return in about 4 weeks (around 11/15/2018).  Sanda Linger, MD

## 2018-10-19 ENCOUNTER — Other Ambulatory Visit (INDEPENDENT_AMBULATORY_CARE_PROVIDER_SITE_OTHER): Payer: Self-pay

## 2018-10-19 ENCOUNTER — Encounter: Payer: Self-pay | Admitting: Internal Medicine

## 2018-10-19 DIAGNOSIS — I10 Essential (primary) hypertension: Secondary | ICD-10-CM

## 2018-10-19 DIAGNOSIS — Z800 Family history of malignant neoplasm of digestive organs: Secondary | ICD-10-CM | POA: Insufficient documentation

## 2018-10-19 DIAGNOSIS — N182 Chronic kidney disease, stage 2 (mild): Secondary | ICD-10-CM

## 2018-10-19 LAB — BASIC METABOLIC PANEL
BUN: 16 mg/dL (ref 6–23)
CO2: 24 mEq/L (ref 19–32)
Calcium: 9 mg/dL (ref 8.4–10.5)
Chloride: 106 mEq/L (ref 96–112)
Creatinine, Ser: 1.3 mg/dL (ref 0.40–1.50)
GFR: 68.83 mL/min (ref >60.00)
Glucose, Bld: 79 mg/dL (ref 70–99)
Potassium: 4.3 mEq/L (ref 3.5–5.1)
Sodium: 141 mEq/L (ref 135–145)

## 2018-10-24 ENCOUNTER — Other Ambulatory Visit: Payer: Self-pay | Admitting: Internal Medicine

## 2018-10-24 ENCOUNTER — Encounter: Payer: Self-pay | Admitting: Internal Medicine

## 2018-10-24 DIAGNOSIS — E519 Thiamine deficiency, unspecified: Secondary | ICD-10-CM

## 2018-10-24 LAB — HIV ANTIBODY (ROUTINE TESTING W REFLEX): HIV 1&2 Ab, 4th Generation: NONREACTIVE

## 2018-10-24 LAB — HEPATITIS C ANTIBODY
Hepatitis C Ab: NONREACTIVE
SIGNAL TO CUT-OFF: 0.03 (ref <1.00)

## 2018-10-24 LAB — VITAMIN B1: Vitamin B1 (Thiamine): 7 nmol/L — ABNORMAL LOW (ref 8–30)

## 2018-10-24 MED ORDER — THIAMINE HCL 100 MG PO TABS: 100.0000 mg | ORAL_TABLET | Freq: Every day | ORAL | 1 refills | Status: DC

## 2018-11-02 ENCOUNTER — Encounter: Payer: Self-pay | Admitting: Family Medicine

## 2018-11-02 ENCOUNTER — Ambulatory Visit: Payer: Self-pay

## 2018-11-02 ENCOUNTER — Other Ambulatory Visit: Payer: Self-pay

## 2018-11-02 ENCOUNTER — Ambulatory Visit (INDEPENDENT_AMBULATORY_CARE_PROVIDER_SITE_OTHER): Payer: Self-pay | Admitting: Family Medicine

## 2018-11-02 VITALS — BP 136/60 | HR 88 | Ht 68.0 in | Wt 184.0 lb

## 2018-11-02 DIAGNOSIS — M25511 Pain in right shoulder: Secondary | ICD-10-CM

## 2018-11-02 DIAGNOSIS — G8929 Other chronic pain: Secondary | ICD-10-CM

## 2018-11-02 DIAGNOSIS — M75101 Unspecified rotator cuff tear or rupture of right shoulder, not specified as traumatic: Secondary | ICD-10-CM | POA: Insufficient documentation

## 2018-11-02 DIAGNOSIS — M75121 Complete rotator cuff tear or rupture of right shoulder, not specified as traumatic: Secondary | ICD-10-CM

## 2018-11-02 MED ORDER — DICLOFENAC SODIUM 2 % TD SOLN: 2.0000 g | Freq: Two times a day (BID) | TRANSDERMAL | 3 refills | Status: DC

## 2018-11-02 NOTE — Assessment & Plan Note (Signed)
Patient does have a rotator cuff tear and given the injection today.  Patient wants to start with home exercises.  Concern of possible need for an MRI will be necessary.  Topical anti-inflammatories sent in.  Discussed with patient that there is a very strong possibility that this may need surgical intervention.  Patient is in agreement with the plan.  Follow-up again in 3 to 4 weeks

## 2018-11-02 NOTE — Progress Notes (Signed)
[facility] [address] [place], [place] [address] Phone: [phone] Subjective:   I Ronelle Nigh am serving as a Neurosurgeon for Dr. Antoine Primas.   CC: Right shoulder pain  [id number]  [name] is a 58 y.o. male coming in with complaint of right shoulder pain. Remembers swinging a tool when he felt an ache.   Onset- Chronic  Location - anterior joint pain Duration-  Character- achy  Aggravating factors- flexion is limited Reliving factors-  Therapies tried- ice, heat, oral Severity-7 out of 10    Patient does have x-rays done.  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 Apical variant hypertrophic cardiomyopathy (HCC)   . CAD (coronary artery disease)    a. LHC in 2011 with severe dz in D1/D2 and very distal LAD--> too small for PCI and managed medically. b.  LHC in 10/2013 after an abnormal nuclear study w/ non-obst dz/c. 01/2016 NSTEMI 2.5 x 38 mm Promus DES to Ramus    . Chronic back pain   . 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  Marland Kitchen HOH (hard of hearing)    rgiht ear  . Hyperlipidemia   . Hypertension   . Migraine headache   . Stroke [facility])    Past Surgical History:  Procedure Laterality Date  . CARDIAC CATHETERIZATION N/A 02/24/2016   Procedure: Left Heart Cath and Coronary Angiography;  Surgeon: Tonny Bollman, MD;  Location: [facility] INVASIVE CV LAB;  Service: Cardiovascular;  Laterality: N/A;  . CARDIAC CATHETERIZATION N/A 02/24/2016   Procedure: Coronary Stent Intervention;  Surgeon: Tonny Bollman, MD;  Location: [facility] INVASIVE CV LAB;  Service:  Cardiovascular;  Laterality: N/A;  . INGUINAL HERNIA REPAIR    . LEFT HEART CATHETERIZATION WITH CORONARY ANGIOGRAM N/A 11/22/2013   Procedure: LEFT HEART CATHETERIZATION WITH CORONARY ANGIOGRAM;  Surgeon: [name], MD;  Location[facility] CATH LAB;  Service: Cardiovascular;  Laterality: N/A;  . TEE WITHOUT CARDIOVERSION N/A 08/22/2015   Procedure: TRANSESOPHAGEAL ECHOCARDIOGRAM (TEE);  Surgeon: Vesta Mixer, MD;  Location: [facility] ENDOSCOPY;  Service: Cardiovascular;  Laterality: N/A;   Social History   Socioeconomic History  . Marital status: Married    Spouse name: Not on file  . Number of children: Not on file  . Years of education: Not on file  . Highest education level: Not on file  Occupational History  . Not on file  Social Needs  . Financial resource strain: Not on file  . Food insecurity:    Worry: Not on file    Inability: Not on file  . Transportation needs:    Medical: Not on file    Non-medical: Not on file  Tobacco Use  . Smoking status: Current Some Day Smoker    Packs/day: 0.00    Types: Cigarettes  . Smokeless tobacco: Never Used  Substance and Sexual Activity  . Alcohol use: No    Alcohol/week: 0.0 standard drinks  . Drug use: No  . Sexual activity: Not on file  Lifestyle  . Physical activity:    Days per week: Not on file  Minutes per session: Not on file  . Stress: Not on file  Relationships  . Social connections:    Talks on phone: Not on file    Gets together: Not on file    Attends religious service: Not on file    Active member of club or organization: Not on file    Attends meetings of clubs or organizations: Not on file    Relationship status: Not on file  Other Topics Concern  . Not on file  Social History Narrative  . Not on file   Allergies  Allergen Reactions  . Lisinopril Swelling   Family History  Problem Relation Age of Onset  . Hypertension Mother   . Heart failure Mother   . Hypertension Father   . Colon cancer Father  27  . Colon cancer Paternal Uncle      Current Outpatient Medications (Cardiovascular):  .  amLODipine (NORVASC) 10 MG tablet, Take 1 tablet (10 mg total) by mouth daily. .  hydrALAZINE (APRESOLINE) 100 MG tablet, Take 1 tablet (100 mg total) by mouth 3 (three) times daily. .  indapamide (LOZOL) 2.5 MG tablet, Take 1 tablet (2.5 mg total) by mouth daily. Marland Kitchen  losartan (COZAAR) 100 MG tablet, Take 1 tablet (100 mg total) by mouth daily. .  rosuvastatin (CRESTOR) 40 MG tablet, Take 1 tablet (40 mg total) by mouth daily. .  nitroGLYCERIN (NITROSTAT) 0.4 MG SL tablet, Place 1 tablet (0.4 mg total) under the tongue every 5 (five) minutes as needed for chest pain.   Current Outpatient Medications (Analgesics):  .  aspirin 81 MG EC tablet, Take 1 tablet (81 mg total) by mouth daily.  Current Outpatient Medications (Hematological):  .  clopidogrel (PLAVIX) 75 MG tablet, TAKE 1 TABLET BY MOUTH EVERY DAY WITH BREAKFEST  Current Outpatient Medications (Other):  .  thiamine (CVS B-1) 100 MG tablet, Take 1 tablet (100 mg total) by mouth daily. .  Diclofenac Sodium (PENNSAID) 2 % SOLN, Place 2 g onto the skin 2 (two) times daily.    Past medical history, social, surgical and family history all reviewed in electronic medical record.  No pertanent information unless stated regarding to the chief complaint.   Review of Systems:  No headache, visual changes, nausea, vomiting, diarrhea, constipation, dizziness, abdominal pain, skin rash, fevers, chills, night sweats, weight loss, swollen lymph nodes, body aches, joint swelling, muscle aches, chest pain, shortness of breath, mood changes.   Objective  Blood pressure 136/60, pulse 88, height  (1.727 m), weight 184 lb (83.5 kg), SpO2 98 %.    General: No apparent distress alert and oriented x3 mood and affect normal, dressed appropriately.  HEENT: Pupils equal, extraocular movements intact  Respiratory: Patient's speak in full sentences and does  not appear short of breath  Cardiovascular: No lower extremity edema, non tender, no erythema  Skin: Warm dry intact with no signs of infection or rash on extremities or on axial skeleton.  Abdomen: Soft nontender  Neuro: Cranial nerves II through XII are intact, neurovascularly intact in all extremities with 2+ DTRs and 2+ pulses.  Lymph: No lymphadenopathy of posterior or anterior cervical chain or axillae bilaterally.  Gait normal with good balance and coordination.  MSK:  Non tender with full range of motion and good stability and symmetric strength and tone of  elbows, wrist, hip, knee and ankles bilaterally.  Shoulder: Right Inspection reveals no abnormalities, atrophy or asymmetry. Palpation is normal with no tenderness over AC joint or bicipital groove.  ROM is full in all planes passively. Rotator cuff strength 3-5 compared to the contralateral side signs of impingement with positive Neer and Hawkin's tests, but negative empty can sign.  Positive O'Brien's positive crossover Normal scapular function observed. Positive painful arc and drop arm sign No apprehension sign Contralateral shoulder unremarkable  MSK US performed of: Right This study was ordered, performed, and interpreted by Terrilee Files D.O.  Shoulder:   Supraspinatus: Severe tearing noted.  Patient does have some retraction of the supraspinatus.] Subscapularis shows some hypoechoic changes and increasing Doppler flow.  Mild retraction also noted.. Moderate arthritic changes of the glenohumeral joint noted. Severe arthritic changes of the acromioclavicular joint.  Impression: Subacromial bursitis  Procedure: Real-time Ultrasound Guided Injection of right glenohumeral joint Device: GE Logiq E  Ultrasound guided injection is preferred based studies that show increased duration, increased effect, greater accuracy, decreased procedural pain, increased response rate with ultrasound guided versus blind injection.  Verbal  informed consent obtained.  Time-out conducted.  Noted no overlying erythema, induration, or other signs of local infection.  Skin prepped in a sterile fashion.  Local anesthesia: Topical Ethyl chloride.  With sterile technique and under real time ultrasound guidance:  Joint visualized.  23g 1  inch needle inserted posterior approach. Pictures taken for needle placement. Patient did have injection of 2 cc of 1% lidocaine, 2 cc of 0.5% Marcaine, and 1.0 cc of Kenalog 40 mg/dL. Completed without difficulty  Pain immediately resolved suggesting accurate placement of the medication.  Advised to call if fevers/chills, erythema, induration, drainage, or persistent bleeding.  Images permanently stored and available for review in the ultrasound unit.  Impression: Technically successful ultrasound guided injection.    Impression and Recommendations:     This case required medical decision making of moderate complexity. The above documentation has been reviewed and is accurate and complete Judi Saa, DO       Note: This dictation was prepared with Dragon dictation along with smaller phrase technology. Any transcriptional errors that result from this process are unintentional.

## 2018-11-02 NOTE — Patient Instructions (Signed)
Good to see you  Ice 20 minutes 2 times daily. Usually after activity and before bed. Exercises 3 times a week.  Keep hands within peripheral vision  See me again in 3-4 weeks and if not better we will need to consider MRI

## 2018-11-23 ENCOUNTER — Encounter: Payer: Self-pay | Admitting: Family Medicine

## 2018-11-23 ENCOUNTER — Ambulatory Visit: Payer: Self-pay

## 2018-11-23 ENCOUNTER — Ambulatory Visit: Payer: Self-pay | Admitting: Family Medicine

## 2018-11-23 ENCOUNTER — Other Ambulatory Visit: Payer: Self-pay | Admitting: Internal Medicine

## 2018-11-23 ENCOUNTER — Other Ambulatory Visit: Payer: Self-pay

## 2018-11-23 VITALS — BP 140/84 | HR 80 | Ht 68.0 in

## 2018-11-23 DIAGNOSIS — I25119 Atherosclerotic heart disease of native coronary artery with unspecified angina pectoris: Secondary | ICD-10-CM

## 2018-11-23 DIAGNOSIS — G8929 Other chronic pain: Secondary | ICD-10-CM

## 2018-11-23 DIAGNOSIS — M19019 Primary osteoarthritis, unspecified shoulder: Secondary | ICD-10-CM

## 2018-11-23 DIAGNOSIS — M25511 Pain in right shoulder: Secondary | ICD-10-CM

## 2018-11-23 DIAGNOSIS — I422 Other hypertrophic cardiomyopathy: Secondary | ICD-10-CM

## 2018-11-23 DIAGNOSIS — I10 Essential (primary) hypertension: Secondary | ICD-10-CM

## 2018-11-23 DIAGNOSIS — M19011 Primary osteoarthritis, right shoulder: Secondary | ICD-10-CM

## 2018-11-23 HISTORY — DX: Primary osteoarthritis, unspecified shoulder: M19.019

## 2018-11-23 MED ORDER — EDARBI 40 MG PO TABS: 1.0000 | ORAL_TABLET | Freq: Every day | ORAL | 0 refills | Status: DC

## 2018-11-23 NOTE — Assessment & Plan Note (Signed)
Patient was given injection.  Tolerated the procedure well.  Discussed icing regimen and home exercise.  I do not believe that this is the likely significant amount of his pain.  I believe it is more secondary to the rotator cuff tear.  And follow-up we will consider the aspiration.  Underlying gout could also be contributing.  Due to patient's financial constraints unable to get an MRI or laboratory work-up at this point patient will consider PRP.  Follow-up again in 2 weeks to consider the PRP anterior approach for the rotator cuff

## 2018-11-23 NOTE — Patient Instructions (Signed)
See me in 2 weeks for PRP anterior approach Arm compression sleeve with work Pennsaid 2x daily as needed Injected AC joint today

## 2018-11-23 NOTE — Progress Notes (Signed)
[facility] [address] [place], [place] [address] Phone: [phone] Subjective:   [name], am serving as a scribe for Dr. Antoine Primas.    CC: Right shoulder pain follow-up  VEL:[id number]   11/02/2018: Patient does have a rotator cuff tear and given the injection today.  Patient wants to start with home exercises.  Concern of possible need for an MRI will be necessary.  Topical anti-inflammatories sent in.  Discussed with patient that there is a very strong possibility that this may need surgical intervention.  Patient is in agreement with the plan.  Follow-up again in 3 to 4 weeks  Update 11/23/2018:  [name] is a 57 y.o. male coming in with complaint of right shoulder pain. Pain is no better since last visit. Injection lasted one day. Does use pennsaid which helps somewhat. Pain is constant if he is not using pennsaid.  Patient states that unfortunately the injection did not last a significant amount of time.  Continues to have discomfort and pain.  Still working on a daily basis.    Past Medical History:  Diagnosis Date   Apical variant hypertrophic cardiomyopathy (HCC)    CAD (coronary artery disease)    a. LHC in 2011 with severe dz in D1/D2 and very distal LAD--> too small for PCI and managed medically. b.  LHC in 10/2013 after an abnormal nuclear study w/ non-obst dz/c. 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   HOH (hard of hearing)    rgiht ear   Hyperlipidemia    Hypertension    Migraine headache    Stroke [facility])    Past Surgical History:  Procedure Laterality Date   CARDIAC CATHETERIZATION  N/A 02/24/2016   Procedure: Left Heart Cath and Coronary Angiography;  Surgeon: Tonny Bollman, MD;  Location: [facility] INVASIVE CV LAB;  Service: Cardiovascular;  Laterality: N/A;   CARDIAC CATHETERIZATION N/A 02/24/2016   Procedure: Coronary Stent Intervention;  Surgeon: Tonny Bollman, MD;  Location: [facility] INVASIVE CV LAB;  Service: Cardiovascular;  Laterality: N/A;   INGUINAL HERNIA REPAIR     LEFT HEART CATHETERIZATION WITH CORONARY ANGIOGRAM N/A 11/22/2013   Procedure: LEFT HEART CATHETERIZATION WITH CORONARY ANGIOGRAM;  Surgeon: [name], MD;  Location: [facility] CATH LAB;  Service: Cardiovascular;  Laterality: N/A;   TEE WITHOUT CARDIOVERSION N/A 08/22/2015   Procedure: TRANSESOPHAGEAL ECHOCARDIOGRAM (TEE);  Surgeon: Vesta Mixer, MD;  Location: [facility] ENDOSCOPY;  Service: Cardiovascular;  Laterality: N/A;   Social History   Socioeconomic History   Marital status: Married    Spouse name: Not on file   Number of children: Not on file   Years of education: Not on file   Highest education level: Not on file  Occupational History   Not on file  Social Needs   Financial resource strain: Not on file   Food insecurity    Worry: Not on file    Inability: Not on file   Transportation needs    Medical: Not on file    Non-medical: Not on file  Tobacco Use   Smoking status: Current Some  Day Smoker    Packs/day: 0.00    Types: Cigarettes   Smokeless tobacco: Never Used  Substance and Sexual Activity   Alcohol use: No    Alcohol/week: 0.0 standard drinks   Drug use: No   Sexual activity: Not on file  Lifestyle   Physical activity    Days per week: Not on file    Minutes per session: Not on file   Stress: Not on file  Relationships   Social connections    Talks on phone: Not on file    Gets together: Not on file    Attends religious service: Not on file    Active member of club or organization: Not on file    Attends meetings of clubs or organizations: Not on file     Relationship status: Not on file  Other Topics Concern   Not on file  Social History Narrative   Not on file   Allergies  Allergen Reactions   Lisinopril Swelling   Family History  Problem Relation Age of Onset   Hypertension Mother    Heart failure Mother    Hypertension Father    Colon cancer Father [id number]   Colon cancer Paternal Uncle      Current Outpatient Medications (Cardiovascular):    amLODipine (NORVASC) 10 MG tablet, Take 1 tablet (10 mg total) by mouth daily.   hydrALAZINE (APRESOLINE) 100 MG tablet, Take 1 tablet (100 mg total) by mouth 3 (three) times daily.   indapamide (LOZOL) 2.5 MG tablet, Take 1 tablet (2.5 mg total) by mouth daily.   rosuvastatin (CRESTOR) 40 MG tablet, Take 1 tablet (40 mg total) by mouth daily.   Azilsartan Medoxomil (EDARBI) 40 MG TABS, Take 1 tablet by mouth daily.   nitroGLYCERIN (NITROSTAT) 0.4 MG SL tablet, Place 1 tablet (0.4 mg total) under the tongue every 5 (five) minutes as needed for chest pain.   Current Outpatient Medications (Analgesics):    aspirin 81 MG EC tablet, Take 1 tablet (81 mg total) by mouth daily.  Current Outpatient Medications (Hematological):    clopidogrel (PLAVIX) 75 MG tablet, TAKE 1 TABLET BY MOUTH EVERY DAY WITH BREAKFEST  Current Outpatient Medications (Other):    Diclofenac Sodium (PENNSAID) 2 % SOLN, Place 2 g onto the skin 2 (two) times daily.   thiamine (CVS B-1) 100 MG tablet, Take 1 tablet (100 mg total) by mouth daily.    Past medical history, social, surgical and family history all reviewed in electronic medical record.  No pertanent information unless stated regarding to the chief complaint.   Review of Systems:  No headache, visual changes, nausea, vomiting, diarrhea, constipation, dizziness, abdominal pain, skin rash, fevers, chills, night sweats, weight loss, swollen lymph nodes, body aches, joint swelling, chest pain, shortness of breath, mood changes.  Positive muscle  aches  Objective  Blood pressure 140/84, pulse 80, height 5\' 8"  (1.727 m), SpO2 98 %.     General: No apparent distress alert and oriented x3 mood and affect normal, dressed appropriately.  HEENT: Pupils equal, extraocular movements intact  Respiratory: Patient's speak in full sentences and does not appear short of breath  Cardiovascular: No lower extremity edema, non tender, no erythema  Skin: Warm dry intact with no signs of infection or rash on extremities or on axial skeleton.  Abdomen: Soft nontender  Neuro: Cranial nerves II through XII are intact, neurovascularly intact in all extremities with 2+ DTRs and 2+ pulses.  Lymph: No lymphadenopathy of posterior or anterior cervical chain  or axillae bilaterally.  Gait normal with good balance and coordination.  MSK:  Non tender with full range of motion and good stability and symmetric strength and tone of  elbows, wrist, hip, knee and ankles bilaterally.  Right shoulder exam does show some mild effusion anteriorly as well as over the acromioclavicular joint.  Patient does have a positive crossover.  3+ out of 5 strength of the rotator cuff.  Positive speeds test.  Contralateral shoulder unremarkable.  After verbal consent patient was prepped with alcohol swabs and with a 25-gauge half inch needle was injected with 0.5 cc of 0.5% Marcaine and 0.5 cc of Kenalog 40 mg/mL.  No blood loss.  Postinjection instructions given    Impression and Recommendations:    . The above documentation has been reviewed and is accurate and complete Lyndal Pulley, DO       Note: This dictation was prepared with Dragon dictation along with smaller phrase technology. Any transcriptional errors that result from this process are unintentional.

## 2018-12-07 ENCOUNTER — Ambulatory Visit (INDEPENDENT_AMBULATORY_CARE_PROVIDER_SITE_OTHER): Payer: Self-pay | Admitting: Family Medicine

## 2018-12-07 ENCOUNTER — Other Ambulatory Visit: Payer: Self-pay

## 2018-12-07 ENCOUNTER — Ambulatory Visit: Payer: Self-pay

## 2018-12-07 ENCOUNTER — Encounter: Payer: Self-pay | Admitting: Family Medicine

## 2018-12-07 VITALS — BP 160/80 | HR 75 | Ht 68.0 in

## 2018-12-07 DIAGNOSIS — M25511 Pain in right shoulder: Secondary | ICD-10-CM

## 2018-12-07 DIAGNOSIS — M75121 Complete rotator cuff tear or rupture of right shoulder, not specified as traumatic: Secondary | ICD-10-CM

## 2018-12-07 DIAGNOSIS — G8929 Other chronic pain: Secondary | ICD-10-CM

## 2018-12-07 NOTE — Progress Notes (Signed)
[facility] [place], [place] [address] Phone: [phone] Subjective:   I [name] am serving as a Education administrator for Dr. Hulan Saas.  CC: rotator cuff tear.   EXB:[id number]   11/23/2018 Patient was given injection.  Tolerated the procedure well.  Discussed icing regimen and home exercise.  I do not believe that this is the likely significant amount of his pain.  I believe it is more secondary to the rotator cuff tear.  And follow-up we will consider the aspiration.  Underlying gout could also be contributing.  Due to patient's financial constraints unable to get an MRI or laboratory work-up at this point patient will consider PRP.  Follow-up again in 2 weeks to consider the PRP anterior approach for the rotator cuff  12/07/2018 [name] is a 58 y.o. male coming in with complaint of shoulder pain. PRP. Patient does have a very large rotator cuff tear.  Patient wants to avoid surgical intervention if possible.  Patient is unable to take off work at the moment and is trying to save up for the surgery.  Going to try PRP first.     Past Medical History:  Diagnosis Date  . Apical variant hypertrophic cardiomyopathy ([place])   . CAD (coronary artery disease)    a. LHC in 2011 with severe dz in D1/D2 and very distal LAD--> too small for PCI and managed medically. b.  LHC in 10/2013 after an abnormal nuclear study w/ non-obst dz/c. 01/2016 NSTEMI 2.5 x 38 mm Promus DES to Ramus    . Chronic back pain   . 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  Marland Kitchen HOH (hard of hearing)    rgiht ear  . Hyperlipidemia   . Hypertension   . Migraine headache   . Stroke [facility])    Past Surgical History:  Procedure  Laterality Date  . CARDIAC CATHETERIZATION N/A 02/24/2016   Procedure: Left Heart Cath and Coronary Angiography;  Surgeon: Sherren Mocha, MD;  Location: [facility] CV LAB;  Service: Cardiovascular;  Laterality: N/A;  . CARDIAC CATHETERIZATION N/A 02/24/2016   Procedure: Coronary Stent Intervention;  Surgeon: Sherren Mocha, MD;  Location: [place] CV LAB;  Service: Cardiovascular;  Laterality: N/A;  . INGUINAL HERNIA REPAIR    . LEFT HEART CATHETERIZATION WITH CORONARY ANGIOGRAM N/A 11/22/2013   Procedure: LEFT HEART CATHETERIZATION WITH CORONARY ANGIOGRAM;  Surgeon: [name], MD;  Location: [facility] CATH LAB;  Service: Cardiovascular;  Laterality: N/A;  . TEE WITHOUT CARDIOVERSION N/A 08/22/2015   Procedure: TRANSESOPHAGEAL ECHOCARDIOGRAM (TEE);  Surgeon: Thayer Headings, MD;  Location: [facility] ENDOSCOPY;  Service: Cardiovascular;  Laterality: N/A;   Social History   Socioeconomic History  . Marital status: Married    Spouse name: Not on file  . Number of children: Not on file  . Years of education: Not on file  . Highest education level: Not on file  Occupational History  . Not on file  Social Needs  . Financial resource strain: Not on file  . Food insecurity    Worry: Not on file    Inability: Not on file  . Transportation needs    Medical: Not on file    Non-medical:  Not on file  Tobacco Use  . Smoking status: Current Some Day Smoker    Packs/day: 0.00    Types: Cigarettes  . Smokeless tobacco: Never Used  Substance and Sexual Activity  . Alcohol use: No    Alcohol/week: 0.0 standard drinks  . Drug use: No  . Sexual activity: Not on file  Lifestyle  . Physical activity    Days per week: Not on file    Minutes per session: Not on file  . Stress: Not on file  Relationships  . Social Musician on phone: Not on file    Gets together: Not on file    Attends religious service: Not on file    Active member of club or organization: Not on file    Attends  meetings of clubs or organizations: Not on file    Relationship status: Not on file  Other Topics Concern  . Not on file  Social History Narrative  . Not on file   Allergies  Allergen Reactions  . Lisinopril Swelling   Family History  Problem Relation Age of Onset  . Hypertension Mother   . Heart failure Mother   . Hypertension Father   . Colon cancer Father 48  . Colon cancer Paternal Uncle      Current Outpatient Medications (Cardiovascular):  .  amLODipine (NORVASC) 10 MG tablet, Take 1 tablet (10 mg total) by mouth daily. .  Azilsartan Medoxomil (EDARBI) 40 MG TABS, Take 1 tablet by mouth daily. .  hydrALAZINE (APRESOLINE) 100 MG tablet, Take 1 tablet (100 mg total) by mouth 3 (three) times daily. .  indapamide (LOZOL) 2.5 MG tablet, Take 1 tablet (2.5 mg total) by mouth daily. .  nitroGLYCERIN (NITROSTAT) 0.4 MG SL tablet, Place 1 tablet (0.4 mg total) under the tongue every 5 (five) minutes as needed for chest pain. .  rosuvastatin (CRESTOR) 40 MG tablet, Take 1 tablet (40 mg total) by mouth daily.   Current Outpatient Medications (Analgesics):  .  aspirin 81 MG EC tablet, Take 1 tablet (81 mg total) by mouth daily.  Current Outpatient Medications (Hematological):  .  clopidogrel (PLAVIX) 75 MG tablet, TAKE 1 TABLET BY MOUTH EVERY DAY WITH BREAKFEST  Current Outpatient Medications (Other):  Marland Kitchen  Diclofenac Sodium (PENNSAID) 2 % SOLN, Place 2 g onto the skin 2 (two) times daily. Marland Kitchen  thiamine (CVS B-1) 100 MG tablet, Take 1 tablet (100 mg total) by mouth daily.    Past medical history, social, surgical and family history all reviewed in electronic medical record.  No pertanent information unless stated regarding to the chief complaint.   Review of Systems:  No headache, visual changes, nausea, vomiting, diarrhea, constipation, dizziness, abdominal pain, skin rash, fevers, chills, night sweats, weight loss, swollen lymph nodes, body aches, joint swelling, muscle aches,  chest pain, shortness of breath, mood changes.   Objective  There were no vitals taken for this visit. Systems examined below as of    General: No apparent distress alert and oriented x3 mood and affect normal, dressed appropriately.  HEENT: Pupils equal, extraocular movements intact  Respiratory: Patient's speak in full sentences and does not appear short of breath  Cardiovascular: No lower extremity edema, non tender, no erythema  Skin: Warm dry intact with no signs of infection or rash on extremities or on axial skeleton.  Abdomen: Soft nontender  Neuro: Cranial nerves II through XII are intact, neurovascularly intact in all extremities with 2+ DTRs and 2+ pulses.  Lymph: No lymphadenopathy of posterior or anterior cervical chain or axillae bilaterally.  Gait normal with good balance and coordination.  MSK:    Procedure: Real-time Ultrasound Guided Injection of right glenohumeral joint Device: GE Logiq Q7  Ultrasound guided injection is preferred based studies that show increased duration, increased effect, greater accuracy, decreased procedural pain, increased response rate with ultrasound guided versus blind injection.  Verbal informed consent obtained.  Time-out conducted.  Noted no overlying erythema, induration, or other signs of local infection.  Skin prepped in a sterile fashion.  Local anesthesia: Topical Ethyl chloride.  With sterile technique and under real time ultrasound guidance:  Joint visualized.  21g 2 inch needle inserted anterior approach. Pictures taken for needle placement. Patient did have injection of  cc of 0.5% Marcaine, and 6 cc of pre centrifuge PRP Completed without difficulty  Pain immediately resolved suggesting accurate placement of the medication.  Advised to call if fevers/chills, erythema, induration, drainage, or persistent bleeding.  Images permanently stored and available for review in the ultrasound unit.  Impression: Technically successful  ultrasound guided injection.    Impression and Recommendations:     This case required medical decision making of moderate complexity. The above documentation has been reviewed and is accurate and complete Judi SaaZachary M Stran Raper, DO       Note: This dictation was prepared with Dragon dictation along with smaller phrase technology. Any transcriptional errors that result from this process are unintentional.

## 2018-12-07 NOTE — Assessment & Plan Note (Signed)
Patient having PRP.  6-week return to progression activities.  Tori's and icing regimen.  Worsening symptoms will need to consider surgical intervention.  Patient understands.  He will follow-up with me potentially in 6 weeks if needed

## 2018-12-08 ENCOUNTER — Other Ambulatory Visit (HOSPITAL_COMMUNITY): Payer: Self-pay

## 2018-12-12 ENCOUNTER — Other Ambulatory Visit: Payer: Self-pay

## 2018-12-12 ENCOUNTER — Ambulatory Visit (HOSPITAL_COMMUNITY): Payer: Self-pay | Attending: Cardiology

## 2018-12-12 DIAGNOSIS — I422 Other hypertrophic cardiomyopathy: Secondary | ICD-10-CM

## 2018-12-15 ENCOUNTER — Telehealth: Payer: Self-pay

## 2018-12-15 NOTE — Telephone Encounter (Signed)
-----   Message from Isaiah Serge, NP sent at 12/15/2018  6:57 AM EDT ----- Echo stable but BP control is very important.  Needs follow up with dr. Tamala Julian in 1-2 months and should be dr. Tamala Julian

## 2018-12-15 NOTE — Telephone Encounter (Signed)
Notes recorded by Frederik Schmidt, RN on 12/15/2018 at 8:43 AM EDT  The patient has been notified of the result and verbalized understanding. All questions (if any) were answered.  Frederik Schmidt, RN 12/15/2018 8:43 AM   ------

## 2019-02-25 NOTE — Progress Notes (Signed)
Cardiology Office Note:    Date:  02/26/2019   ID:  [name], DOB June 16, 1960, MRN [id number]  PCP:  [name], MD  Cardiologist:  [name], MD   Referring MD: [name], MD   Chief Complaint  Patient presents with  . Coronary Artery Disease  . Hyperlipidemia    History of Present Illness:    [name] is a 58 y.o. male with a hx of HTN and CAD, who was admitted several weeks ago with an NSTEMI. The patient was treated with PCI to a large RI stenosis. Documented high grade AVB during sleep.   Minimal angina. No dyspnea. Has not needed NTG.  He is working vigorously without limitations.  Review of recent data demonstrates extreme elevation of LDL cholesterol despite being on max dose rosuvastatin.  We discussed the importance of lipid lowering.  Past Medical History:  Diagnosis Date  . Apical variant hypertrophic cardiomyopathy ([place])   . CAD (coronary artery disease)    a. LHC in 2011 with severe dz in D1/D2 and very distal LAD--> too small for PCI and managed medically. b.  LHC in 10/2013 after an abnormal nuclear study w/ non-obst dz/c. 01/2016 NSTEMI 2.5 x 38 mm Promus DES to Ramus    . Chronic back pain   . 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  Marland Kitchen HOH (hard of hearing)    rgiht ear  . Hyperlipidemia   . Hypertension   . Migraine headache   . Stroke [facility])     Past Surgical History:  Procedure Laterality Date  . CARDIAC CATHETERIZATION N/A 02/24/2016   Procedure: Left Heart Cath and Coronary Angiography;  Surgeon: Sherren Mocha, MD;  Location: [place] CV LAB;  Service: Cardiovascular;  Laterality: N/A;  . CARDIAC CATHETERIZATION N/A 02/24/2016   Procedure: Coronary Stent  Intervention;  Surgeon: Sherren Mocha, MD;  Location: [place] CV LAB;  Service: Cardiovascular;  Laterality: N/A;  . INGUINAL HERNIA REPAIR    . LEFT HEART CATHETERIZATION WITH CORONARY ANGIOGRAM N/A 11/22/2013   Procedure: LEFT HEART CATHETERIZATION WITH CORONARY ANGIOGRAM;  Surgeon: [name], MD;  Location: [facility] CATH LAB;  Service: Cardiovascular;  Laterality: N/A;  . TEE WITHOUT CARDIOVERSION N/A 08/22/2015   Procedure: TRANSESOPHAGEAL ECHOCARDIOGRAM (TEE);  Surgeon: Thayer Headings, MD;  Location: [facility] ENDOSCOPY;  Service: Cardiovascular;  Laterality: N/A;    Current Medications: Current Meds  Medication Sig  . amLODipine (NORVASC) 10 MG tablet Take 1 tablet (10 mg total) by mouth daily.  Marland Kitchen aspirin 81 MG EC tablet Take 1 tablet (81 mg total) by mouth daily.  . Azilsartan Medoxomil (EDARBI) 40 MG TABS Take 1 tablet by mouth daily.  . clopidogrel (PLAVIX) 75 MG tablet TAKE 1 TABLET BY MOUTH EVERY DAY WITH BREAKFEST  . Diclofenac Sodium (PENNSAID) 2 % SOLN Place 2 g onto the skin 2 (two) times daily.  . hydrALAZINE (APRESOLINE) 100 MG tablet Take 1 tablet (100 mg total) by mouth 3 (three) times daily.  . indapamide (LOZOL) 2.5 MG tablet Take 1 tablet (2.5 mg total) by mouth daily.  . nitroGLYCERIN (NITROSTAT) 0.4 MG SL tablet Place 1 tablet (0.4 mg total) under the tongue every 5 (five) minutes as  needed for chest pain.  . rosuvastatin (CRESTOR) 40 MG tablet Take 1 tablet (40 mg total) by mouth daily.  Marland Kitchen thiamine (CVS B-1) 100 MG tablet Take 1 tablet (100 mg total) by mouth daily.     Allergies:   Lisinopril   Social History   Socioeconomic History  . Marital status: Married    Spouse name: Not on file  . Number of children: Not on file  . Years of education: Not on file  . Highest education level: Not on file  Occupational History  . Not on file  Social Needs  . Financial resource strain: Not on file  . Food insecurity    Worry: Not on file    Inability: Not on file   . Transportation needs    Medical: Not on file    Non-medical: Not on file  Tobacco Use  . Smoking status: Current Some Day Smoker    Packs/day: 0.00    Types: Cigarettes  . Smokeless tobacco: Never Used  Substance and Sexual Activity  . Alcohol use: No    Alcohol/week: 0.0 standard drinks  . Drug use: No  . Sexual activity: Not on file  Lifestyle  . Physical activity    Days per week: Not on file    Minutes per session: Not on file  . Stress: Not on file  Relationships  . Social Musician on phone: Not on file    Gets together: Not on file    Attends religious service: Not on file    Active member of club or organization: Not on file    Attends meetings of clubs or organizations: Not on file    Relationship status: Not on file  Other Topics Concern  . Not on file  Social History Narrative  . Not on file     Family History: The patient's family history includes Colon cancer in his paternal uncle; Colon cancer (age of onset: 4) in his father; Heart failure in his mother; Hypertension in his father and mother.  ROS:   Please see the history of present illness.    Sleeps well.  Stress related to his work in the cold at pandemic.  All other systems reviewed and are negative.  EKGs/Labs/Other Studies Reviewed:    The following studies were reviewed today: No new data  EKG:   the prior tracing from May 2020, sinus bradycardia, and marked T wave abnormality..  When compared to prior tracings, the heart rate is slower.  Recent Labs: 10/18/2018: Hemoglobin 12.5; Platelets 271.0; TSH 1.44 10/19/2018: BUN 16; Creatinine, Ser 1.30; Potassium 4.3; Sodium 141  Recent Lipid Panel    Component Value Date/Time   CHOL 192 10/18/2018 1131   CHOL 138 11/29/2016 0813   TRIG 51.0 10/18/2018 1131   HDL 51.20 10/18/2018 1131   HDL 43 11/29/2016 0813   CHOLHDL 4 10/18/2018 1131   VLDL 10.2 10/18/2018 1131   LDLCALC 130 (H) 10/18/2018 1131   LDLCALC 82 11/29/2016 0813     Physical Exam:    VS:  BP (!) 142/86   Pulse 80   Ht 5\' 8"  (1.727 m)   Wt 183 lb 6.4 oz (83.2 kg)   SpO2 98%   BMI 27.89 kg/m     Wt Readings from Last 3 Encounters:  02/26/19 183 lb 6.4 oz (83.2 kg)  11/02/18 184 lb (83.5 kg)  10/18/18 188 lb 4 oz (85.4 kg)     GEN: Healthy-appearing. No acute distress HEENT:  Normal NECK: No JVD. LYMPHATICS: No lymphadenopathy CARDIAC:  RRR without murmur, gallop, or edema. VASCULAR:  Normal Pulses. No bruits. RESPIRATORY:  Clear to auscultation without rales, wheezing or rhonchi  ABDOMEN: Soft, non-tender, non-distended, No pulsatile mass, MUSCULOSKELETAL: No deformity  SKIN: Warm and dry NEUROLOGIC:  Alert and oriented x 3 PSYCHIATRIC:  Normal affect   ASSESSMENT:    1. Apical variant hypertrophic cardiomyopathy (HCC)   2. Essential hypertension   3. Coronary artery disease involving native coronary artery of native heart with angina pectoris (HCC)   4. Hyperlipidemia with target LDL less than 70   5. Educated About Covid-19 Virus Infection    PLAN:    In order of problems listed above:  1. Clinical diagnosis.  EKG is stable. 2. Systolic blood pressure is mildly elevated.  Low-salt diet.  Aerobic activity encouraged. 3. Discontinue aspirin but continue Plavix 75 mg/day. 4. Most recent LDL was not at target.  He advocates medication compliance.  Was sent to the lipid clinic with target LDL less than 70 and preferably 55. 5. Handwashing, transferring from socially distancing is recommended.  Overall education and awareness concerning primary/secondary risk prevention was discussed in detail: LDL less than 70, hemoglobin A1c less than 7, blood pressure target less than 130/80 mmHg, >150 minutes of moderate aerobic activity per week, avoidance of smoking, weight control (via diet and exercise), and continued surveillance/management of/for obstructive sleep apnea.    Medication Adjustments/Labs and Tests Ordered: Current medicines  are reviewed at length with the patient today.  Concerns regarding medicines are outlined above.  Orders Placed This Encounter  Procedures  . AMB Referral to Advanced Lipid Disorders Clinic   No orders of the defined types were placed in this encounter.   There are no Patient Instructions on file for this visit.   Signed, [name], MD  02/26/2019 1:42 PM    [facility]

## 2019-02-26 ENCOUNTER — Encounter: Payer: Self-pay | Admitting: Interventional Cardiology

## 2019-02-26 ENCOUNTER — Ambulatory Visit (INDEPENDENT_AMBULATORY_CARE_PROVIDER_SITE_OTHER): Payer: Self-pay | Admitting: Interventional Cardiology

## 2019-02-26 ENCOUNTER — Other Ambulatory Visit: Payer: Self-pay

## 2019-02-26 VITALS — BP 142/86 | HR 80 | Ht 68.0 in | Wt 183.4 lb

## 2019-02-26 DIAGNOSIS — I25119 Atherosclerotic heart disease of native coronary artery with unspecified angina pectoris: Secondary | ICD-10-CM

## 2019-02-26 DIAGNOSIS — E785 Hyperlipidemia, unspecified: Secondary | ICD-10-CM

## 2019-02-26 DIAGNOSIS — Z7189 Other specified counseling: Secondary | ICD-10-CM

## 2019-02-26 DIAGNOSIS — I10 Essential (primary) hypertension: Secondary | ICD-10-CM

## 2019-02-26 DIAGNOSIS — I422 Other hypertrophic cardiomyopathy: Secondary | ICD-10-CM

## 2019-02-26 NOTE — Patient Instructions (Addendum)
Medication Instructions:  1) DISCONTINUE Aspirin  If you need a refill on your cardiac medications before your next appointment, please call your pharmacy.   Lab work: None If you have labs (blood work) drawn today and your tests are completely normal, you will receive your results only by: Marland Kitchen MyChart Message (if you have MyChart) OR . A paper copy in the mail If you have any lab test that is abnormal or we need to change your treatment, we will call you to review the results.  Testing/Procedures: None  Follow-Up: At [facility], you and your health needs are our priority.  As part of our continuing mission to provide you with exceptional heart care, we have created designated Provider Care Teams.  These Care Teams include your primary Cardiologist (physician) and Advanced Practice Providers (APPs -  Physician Assistants and Nurse Practitioners) who all work together to provide you with the care you need, when you need it. You will need a follow up appointment in 9-12 months.  Please call our office 2 months in advance to schedule this appointment.  You may see [name], MD or one of the following Advanced Practice Providers on your designated Care Team:   Truitt Merle, NP Cecilie Kicks, NP . Kathyrn Drown, NP  Any Other Special Instructions Will Be Listed Below (If Applicable).  You have been referred to the Lipid clinic here in our office

## 2019-02-27 ENCOUNTER — Telehealth: Payer: Self-pay | Admitting: Interventional Cardiology

## 2019-02-27 NOTE — Telephone Encounter (Signed)
Returned call and spoke with pt - he states he has been off of rosuvastatin for at least 3 months. Last lipid panel was drawn 5/20 - LDL 130 at the time, pt states he believes he had been taking rosuvastatin leading up to that lipid panel. Advised pt to restart his rosuvastatin and to keep his appt for next week since his LDL back in May when he was on rosuvastatin was still elevated above goal.

## 2019-02-27 NOTE — Telephone Encounter (Signed)
° °  Spouse calling to report they realized last night patient had not taking  rosuvastatin (CRESTOR) 40 MG tablet since May. They plan to pick up medication today from pharmacy.  Should patient keep the 10/7 appointment with pharmacist?

## 2019-03-01 ENCOUNTER — Telehealth: Payer: Self-pay | Admitting: Internal Medicine

## 2019-03-01 NOTE — Telephone Encounter (Signed)
Pt called in refill request for indapamide (LOZOL) 2.5 MG tablet    Pharmacy: [facility] [place], [place]

## 2019-03-01 NOTE — Telephone Encounter (Signed)
Appt has been made.   Pt is requesting refill of indapaminde to [facility] on [address].   Please advise

## 2019-03-01 NOTE — Telephone Encounter (Signed)
Pt contacted and spoke to spouse, informed he is due for a follow up appointment.

## 2019-03-02 ENCOUNTER — Other Ambulatory Visit: Payer: Self-pay | Admitting: Internal Medicine

## 2019-03-02 DIAGNOSIS — I10 Essential (primary) hypertension: Secondary | ICD-10-CM

## 2019-03-02 MED ORDER — INDAPAMIDE 2.5 MG PO TABS: 2.5000 mg | ORAL_TABLET | Freq: Every day | ORAL | 0 refills | Status: DC

## 2019-03-06 ENCOUNTER — Ambulatory Visit (INDEPENDENT_AMBULATORY_CARE_PROVIDER_SITE_OTHER): Payer: Self-pay | Admitting: Internal Medicine

## 2019-03-06 ENCOUNTER — Other Ambulatory Visit: Payer: Self-pay

## 2019-03-06 ENCOUNTER — Encounter: Payer: Self-pay | Admitting: Internal Medicine

## 2019-03-06 ENCOUNTER — Other Ambulatory Visit (INDEPENDENT_AMBULATORY_CARE_PROVIDER_SITE_OTHER): Payer: Self-pay

## 2019-03-06 VITALS — BP 164/100 | HR 75 | Temp 98.4°F | Ht 68.0 in | Wt 180.2 lb

## 2019-03-06 DIAGNOSIS — C61 Malignant neoplasm of prostate: Secondary | ICD-10-CM | POA: Insufficient documentation

## 2019-03-06 DIAGNOSIS — N182 Chronic kidney disease, stage 2 (mild): Secondary | ICD-10-CM

## 2019-03-06 DIAGNOSIS — I10 Essential (primary) hypertension: Secondary | ICD-10-CM

## 2019-03-06 DIAGNOSIS — E519 Thiamine deficiency, unspecified: Secondary | ICD-10-CM

## 2019-03-06 DIAGNOSIS — Z9114 Patient's other noncompliance with medication regimen: Secondary | ICD-10-CM

## 2019-03-06 DIAGNOSIS — R972 Elevated prostate specific antigen [PSA]: Secondary | ICD-10-CM

## 2019-03-06 DIAGNOSIS — I25119 Atherosclerotic heart disease of native coronary artery with unspecified angina pectoris: Secondary | ICD-10-CM

## 2019-03-06 LAB — BASIC METABOLIC PANEL
BUN: 13 mg/dL (ref 6–23)
CO2: 25 mEq/L (ref 19–32)
Calcium: 9.6 mg/dL (ref 8.4–10.5)
Chloride: 105 mEq/L (ref 96–112)
Creatinine, Ser: 1.23 mg/dL (ref 0.40–1.50)
GFR: 73.27 mL/min (ref >60.00)
Glucose, Bld: 74 mg/dL (ref 70–99)
Potassium: 3.9 mEq/L (ref 3.5–5.1)
Sodium: 139 mEq/L (ref 135–145)

## 2019-03-06 LAB — CBC WITH DIFFERENTIAL/PLATELET
Basophils Absolute: 0.1 10*3/uL (ref 0.0–0.1)
Basophils Relative: 0.9 % (ref 0.0–3.0)
Eosinophils Absolute: 0.2 10*3/uL (ref 0.0–0.7)
Eosinophils Relative: 2.7 % (ref 0.0–5.0)
HCT: 38.6 % — ABNORMAL LOW (ref 39.0–52.0)
Hemoglobin: 12.5 g/dL — ABNORMAL LOW (ref 13.0–17.0)
Lymphocytes Relative: 24.5 % (ref 12.0–46.0)
Lymphs Abs: 1.5 10*3/uL (ref 0.7–4.0)
MCHC: 32.3 g/dL (ref 30.0–36.0)
MCV: 91.8 fl (ref 78.0–100.0)
Monocytes Absolute: 0.4 10*3/uL (ref 0.1–1.0)
Monocytes Relative: 6.6 % (ref 3.0–12.0)
Neutro Abs: 3.9 10*3/uL (ref 1.4–7.7)
Neutrophils Relative %: 65.3 % (ref 43.0–77.0)
Platelets: 291 10*3/uL (ref 150.0–400.0)
RBC: 4.2 Mil/uL — ABNORMAL LOW (ref 4.22–5.81)
RDW: 12.6 % (ref 11.5–15.5)
WBC: 5.9 10*3/uL (ref 4.0–10.5)

## 2019-03-06 LAB — PSA: PSA: 3.62 ng/mL (ref 0.10–4.00)

## 2019-03-06 MED ORDER — EDARBYCLOR 40-25 MG PO TABS: 1.0000 | ORAL_TABLET | Freq: Every day | ORAL | 0 refills | Status: DC

## 2019-03-06 NOTE — Patient Instructions (Addendum)
It was a pleasure seeing you in clinic today Mr. Vadala!  Today the plan is...  Please call the PharmD clinic at [phone] if you have any questions that you would like to speak with a pharmacist about [facility], [place], [place]).

## 2019-03-06 NOTE — Patient Instructions (Signed)

## 2019-03-06 NOTE — Progress Notes (Signed)
Patient ID: [name]                 DOB: 1960-12-04                    MRN: [id number]     HPI: [name] is a 59 y.o. male patient referred to lipid clinic by Dr. Katrinka English. PMH is significant for HTN, CAD, and hx of NSTEMI (2017; treated with PCI to a large RI stenosis), migraine, GERD, arthritis, Stage II CKD, and ED. Pt documented high grade AVB during sleep. Pt recently called and stated he had been off of rosuvastatin for at least 3 months. His last lipid panel was drawn May 2020 (LDL 130 at the time). Pt states he believes he had been taking rosuvastatin leading up to that lipid panel.   Pt presents today for initial appt with lipid clinic.   --updated insurance card??? --amgen safety net???  Current Medications: rosuvastatin 40 mg daily Intolerances: zetia 10 mg daily, simvastatin 40 mg daily Risk Factors: hx of NSTEMI, CAD  LDL goal: <55 mg/dL  Diet:   Exercise:   Family History: father (colon cancer (age of onset: 32), HTN); mother (HF, HTN); paternal uncle (colon cancer)  Social History:   Labs: 10/18/18: Chol 192 HDL 51.20 LDL 130 NonHDL 140.43 RG 51.0 VLDL 10.2; rosuvastatin 40 mg daily  Past Medical History:  Diagnosis Date   Apical variant hypertrophic cardiomyopathy (HCC)    CAD (coronary artery disease)    a. LHC in 2011 with severe dz in D1/D2 and very distal LAD--> too small for PCI and managed medically. b.  LHC in 10/2013 after an abnormal nuclear study w/ non-obst dz/c. 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   HOH (hard of hearing)    rgiht ear   Hyperlipidemia    Hypertension    Migraine headache    Stroke  [facility])     Current Outpatient Medications on File Prior to Visit  Medication Sig Dispense Refill   amLODipine (NORVASC) 10 MG tablet Take 1 tablet (10 mg total) by mouth daily. 90 tablet 1   Azilsartan-Chlorthalidone (EDARBYCLOR) 40-25 MG TABS Take 1 tablet by mouth daily. 70 tablet 0   clopidogrel (PLAVIX) 75 MG tablet TAKE 1 TABLET BY MOUTH EVERY DAY WITH BREAKFEST (Patient not taking: Reported on 03/06/2019) 30 tablet 11   hydrALAZINE (APRESOLINE) 100 MG tablet Take 1 tablet (100 mg total) by mouth 3 (three) times daily. 270 tablet 1   nitroGLYCERIN (NITROSTAT) 0.4 MG SL tablet Place 1 tablet (0.4 mg total) under the tongue every 5 (five) minutes as needed for chest pain. 25 tablet 3   rosuvastatin (CRESTOR) 40 MG tablet Take 1 tablet (40 mg total) by mouth daily. 90 tablet 1   thiamine (CVS B-1) 100 MG tablet Take 1 tablet (100 mg total) by mouth daily. 100 tablet 1   No current facility-administered medications on file prior to visit.     Allergies  Allergen Reactions   Lisinopril Swelling    Assessment/Plan:  1. Hyperlipidemia - goal < 55 mg/dL per Dr. Katrinka English; therefore, pt is  not at goal. Plan to initiate PCSK9 inhibitor.  Thank you for involving pharmacy to assist in providing [name]'s care.   Drexel Iha, PharmD PGY2 Ambulatory Care Pharmacy Resident

## 2019-03-06 NOTE — Progress Notes (Signed)
Subjective:  Patient ID: [name], male    DOB: 10-Dec-1960  Age: 58 y.o. MRN: [id number]  CC: Anemia and Hypertension   HPI [name] presents for f/up - He is not taking indapamide or azilsartan because they were too expensive.  He is uninsured.  He tells me he is taking amlodipine and hydralazine.  He does not monitor his blood pressure.  He recently saw his cardiologist.  Outpatient Medications Prior to Visit  Medication Sig Dispense Refill   amLODipine (NORVASC) 10 MG tablet Take 1 tablet (10 mg total) by mouth daily. 90 tablet 1   hydrALAZINE (APRESOLINE) 100 MG tablet Take 1 tablet (100 mg total) by mouth 3 (three) times daily. 270 tablet 1   rosuvastatin (CRESTOR) 40 MG tablet Take 1 tablet (40 mg total) by mouth daily. 90 tablet 1   thiamine (CVS B-1) 100 MG tablet Take 1 tablet (100 mg total) by mouth daily. 100 tablet 1   clopidogrel (PLAVIX) 75 MG tablet TAKE 1 TABLET BY MOUTH EVERY DAY WITH BREAKFEST (Patient not taking: Reported on 03/06/2019) 30 tablet 11   nitroGLYCERIN (NITROSTAT) 0.4 MG SL tablet Place 1 tablet (0.4 mg total) under the tongue every 5 (five) minutes as needed for chest pain. 25 tablet 3   Azilsartan Medoxomil (EDARBI) 40 MG TABS Take 1 tablet by mouth daily. (Patient not taking: Reported on 03/06/2019) 90 tablet 0   Diclofenac Sodium (PENNSAID) 2 % SOLN Place 2 g onto the skin 2 (two) times daily. (Patient not taking: Reported on 03/06/2019) 112 g 3   indapamide (LOZOL) 2.5 MG tablet Take 1 tablet (2.5 mg total) by mouth daily. (Patient not taking: Reported on 03/06/2019) 30 tablet 0   No facility-administered medications prior to visit.     ROS Review of Systems  Constitutional: Negative for diaphoresis and fatigue.  HENT: Negative.  Negative for trouble swallowing.   Eyes: Negative for visual disturbance.  Respiratory: Negative for cough, chest tightness, shortness of breath and wheezing.   Cardiovascular: Negative for chest pain,  palpitations and leg swelling.  Gastrointestinal: Negative for abdominal pain, constipation, diarrhea, nausea and vomiting.  Endocrine: Negative.   Genitourinary: Negative.  Negative for difficulty urinating and hematuria.  Musculoskeletal: Negative for arthralgias and myalgias.  Skin: Negative for color change and rash.  Neurological: Positive for dizziness. Negative for weakness, light-headedness, numbness and headaches.  Hematological: Negative for adenopathy. Does not bruise/bleed easily.  Psychiatric/Behavioral: Negative.     Objective:  BP (!) 164/100 (BP Location: Left Arm, Patient Position: Sitting, Cuff Size: Normal)    Pulse 75    Temp 98.4 F (36.9 C) (Oral)    Ht 5\' 8"  (1.727 m)    Wt 180 lb 4 oz (81.8 kg)    SpO2 99%    BMI 27.41 kg/m   BP Readings from Last 3 Encounters:  03/06/19 (!) 164/100  02/26/19 (!) 142/86  12/07/18 (!) 160/80    Wt Readings from Last 3 Encounters:  03/06/19 180 lb 4 oz (81.8 kg)  02/26/19 183 lb 6.4 oz (83.2 kg)  11/02/18 184 lb (83.5 kg)    Physical Exam Vitals signs reviewed.  HENT:     Nose: Nose normal.  Eyes:     General: No scleral icterus.    Conjunctiva/sclera: Conjunctivae normal.  Neck:     Musculoskeletal: Normal range of motion. No neck rigidity.  Cardiovascular:     Rate and Rhythm: Normal rate and regular rhythm.     Heart sounds: No  murmur.  Pulmonary:     Effort: Pulmonary effort is normal.     Breath sounds: No stridor. No wheezing, rhonchi or rales.  Abdominal:     General: Abdomen is flat. Bowel sounds are normal.     Palpations: There is no hepatomegaly or splenomegaly.     Tenderness: There is no abdominal tenderness.  Musculoskeletal: Normal range of motion.     Right lower leg: No edema.     Left lower leg: No edema.  Skin:    General: Skin is warm and dry.  Neurological:     General: No focal deficit present.     Mental Status: He is alert.  Psychiatric:        Mood and Affect: Mood normal.         Behavior: Behavior normal.     Lab Results  Component Value Date   WBC 5.9 03/06/2019   HGB 12.5 (L) 03/06/2019   HCT 38.6 (L) 03/06/2019   PLT 291.0 03/06/2019   GLUCOSE 74 03/06/2019   CHOL 192 10/18/2018   TRIG 51.0 10/18/2018   HDL 51.20 10/18/2018   LDLCALC 130 (H) 10/18/2018   ALT 13 11/29/2016   AST 15 11/29/2016   NA 139 03/06/2019   K 3.9 03/06/2019   CL 105 03/06/2019   CREATININE 1.23 03/06/2019   BUN 13 03/06/2019   CO2 25 03/06/2019   TSH 1.44 10/18/2018   PSA 3.62 03/06/2019   INR 1.06 02/24/2016   HGBA1C  09/17/2009    5.1 (NOTE)                                                                       According to the ADA Clinical Practice Recommendations for 2011, when HbA1c is used as a screening test:   >=6.5%   Diagnostic of Diabetes Mellitus           (if abnormal result  is confirmed)  5.7-6.4%   Increased risk of developing Diabetes Mellitus  References:Diagnosis and Classification of Diabetes Mellitus,Diabetes Care,2011,34(Suppl 1):S62-S69 and Standards of Medical Care in         Diabetes - 2011,Diabetes Care,2011,34  (Suppl 1):S11-S61.    Dg Shoulder Right  Result Date: 10/18/2018 CLINICAL DATA:  Right shoulder pain for the past month. EXAM: RIGHT SHOULDER - 2+ VIEW COMPARISON:  None. FINDINGS: No fracture or dislocation. Mild degenerative change the right glenohumeral joint with joint space loss, subchondral sclerosis and osteophytosis. Moderate degenerative change of the right AC joint with joint space loss and inferiorly directed osteophytosis. Additionally there is a apparent prominent osteophyte about the undersurface of the acromion. No evidence of calcific tendinitis. Limited visualization adjacent thorax is normal. Regional soft tissues appear normal. IMPRESSION: 1. No acute findings. 2. Moderate degenerative change the right AC joint with associated prominent osteophyte about the undersurface of the acromion, nonspecific though could predispose the  patient to rotator cuff impingement type syndromes. 3. Mild degenerative change the right glenohumeral joint. Electronically Signed   By: Simonne Come M.D.   On: 10/18/2018 17:39    Assessment & Plan:   [name] was seen today for anemia and hypertension.  Diagnoses and all orders for this visit:  Essential hypertension- His blood pressure is not adequately  well controlled.  I have given him samples of Edarbyclor and asked him to continue taking hydralazine and amlodipine. -     Azilsartan-Chlorthalidone (EDARBYCLOR) 40-25 MG TABS; Take 1 tablet by mouth daily. -     Basic metabolic panel; Future  Chronic renal insufficiency, stage II (mild)- His renal function has improved.  He will avoid nephrotoxic agents.  Will try to achieve better blood pressure control.  Thiamine deficiency-his H&H remain mildly low.  I will recheck his thiamine level. -     CBC with Differential/Platelet; Future -     Vitamin B1; Future  Drug noncompliance  Coronary artery disease involving native coronary artery of native heart with angina pectoris (HCC) -     Azilsartan-Chlorthalidone (EDARBYCLOR) 40-25 MG TABS; Take 1 tablet by mouth daily.  Rising PSA level- His PSA has not risen statistically over the last 5 months.  This is reassuring that he does not have prostate cancer.  Will recheck his PSA in about 6 months. -     PSA; Future   I have discontinued [name]'s Diclofenac Sodium, Edarbi, and indapamide. I am also having him start on Edarbyclor. Additionally, I am having him maintain his nitroGLYCERIN, clopidogrel, hydrALAZINE, amLODipine, rosuvastatin, and thiamine.  Meds ordered this encounter  Medications   Azilsartan-Chlorthalidone (EDARBYCLOR) 40-25 MG TABS    Sig: Take 1 tablet by mouth daily.    Dispense:  70 tablet    Refill:  0     Follow-up: Return in about 6 weeks (around 04/17/2019).  Scarlette Calico, MD

## 2019-03-07 ENCOUNTER — Ambulatory Visit: Payer: Self-pay

## 2019-03-09 ENCOUNTER — Encounter: Payer: Self-pay | Admitting: Pharmacist

## 2019-03-12 ENCOUNTER — Other Ambulatory Visit: Payer: Self-pay | Admitting: Internal Medicine

## 2019-03-12 ENCOUNTER — Encounter: Payer: Self-pay | Admitting: Internal Medicine

## 2019-03-12 DIAGNOSIS — E519 Thiamine deficiency, unspecified: Secondary | ICD-10-CM

## 2019-03-12 LAB — VITAMIN B1: Vitamin B1 (Thiamine): 100 nmol/L — ABNORMAL HIGH (ref 8–30)

## 2019-03-12 MED ORDER — THIAMINE HCL 100 MG PO TABS: 100.0000 mg | ORAL_TABLET | ORAL | 1 refills | Status: DC

## 2019-03-14 NOTE — Progress Notes (Signed)
Patient ID: [name]                 DOB: 1960-07-07                    MRN: [id number]     HPI: [name] is a 58 y.o. male patient referred to lipid clinic by Dr. Katrinka Blazing. PMH is significant for CAD w/ angina, HTN, HLD, NSTEMI (2017; treated with PCI to a large RI stenosis), stroke, migraine, GERD, arthritis, Stage II CKD, ED and documented high grade AVB during sleep. Of note, patient is uninsured.   Per conversation with the pharmacist, Margaretmary Dys, on 02/27/2019, patient states that he has been off rosuvastatin for at least 3 months and was advised to restart rosuvastatin. Patient does not have insurance and was unable to afford the medication at that time.    Patients presents today in good spirits to the lipid clinic. Patient reports he has restarted taking rosuvastatin 40 mg about 1 week ago and has not experienced any side effects. He has previously tried Zetia 10 mg daily and simvastatin 40 mg daily but cannot not recall why he stopped taking these medications. His diet consist of scrambled eggs 3-4 times a week and toast for breakfast; Ensure and fast food for lunch and dinner. 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  Current Medications: Rosuvastatin 40 mg daily Intolerances: Zetia 10 mg daily, simvastatin 40 mg daily (unknown side effects) Risk Factors: HTN, CAD, hx of NSTEMI and stroke LDL goal: < 55 mg/dL  Diet: scrambled eggs 3-4 times a week and toast for breakfast, ensure and fast food for lunch and dinner.  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  Family History: The patient's family history includes Colon cancer in his paternal uncle; Colon cancer (age of onset: 82) in his father; Heart failure in his mother; Hypertension in his father and mother  Social History: Tobacco smoker  Labs: 10/18/2018: LDL 130, TG 51, TC 192, HDL 51,  NonHDL 140.43 (rosuvastatin 40 mg daily) 11/29/2016: LDL 82, TG 67, TC 138, HDL 43  Past Medical History:  Diagnosis Date  . Apical variant hypertrophic cardiomyopathy (HCC)   . CAD (coronary artery disease)    a. LHC in 2011 with severe dz in D1/D2 and very distal LAD--> too small for PCI and managed medically. b.  LHC in 10/2013 after an abnormal nuclear study w/ non-obst dz/c. 01/2016 NSTEMI 2.5 x 38 mm Promus DES to Ramus    . Chronic back pain   . 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  Marland Kitchen HOH (hard of hearing)    rgiht ear  . Hyperlipidemia   . Hypertension   . Migraine headache   . Stroke [facility])     Current Outpatient Medications on File Prior to Visit  Medication Sig Dispense Refill  . amLODipine (NORVASC) 10 MG tablet Take 1 tablet (10 mg total) by mouth daily. 90 tablet 1  . Azilsartan-Chlorthalidone (EDARBYCLOR) 40-25 MG TABS Take 1 tablet by mouth daily. 70 tablet 0  . clopidogrel (PLAVIX) 75 MG  tablet TAKE 1 TABLET BY MOUTH EVERY DAY WITH BREAKFEST (Patient not taking: Reported on 03/06/2019) 30 tablet 11  . hydrALAZINE (APRESOLINE) 100 MG tablet Take 1 tablet (100 mg total) by mouth 3 (three) times daily. 270 tablet 1  . nitroGLYCERIN (NITROSTAT) 0.4 MG SL tablet Place 1 tablet (0.4 mg total) under the tongue every 5 (five) minutes as needed for chest pain. 25 tablet 3  . rosuvastatin (CRESTOR) 40 MG tablet Take 1 tablet (40 mg total) by mouth daily. 90 tablet 1  . thiamine ([facility] B-1) 100 MG tablet Take 1 tablet (100 mg total) by mouth every other day. 45 tablet 1   No current facility-administered medications on file prior to visit.     Allergies  Allergen Reactions  . Lisinopril Swelling    Assessment/Plan:  1.  Hyperlipidemia - LDL is above goal < 55 mg/dL. Start injecting Repathasubcutaneously into the stomach every other week.Patient completed the [employer] form in clinic and will be contacted once application is approved. Patient is aware that Repatha will be mailed directly to his home. Patient was instructed to store unused pens inside the fridge and counseled on the benefits, potential side effects, and proper administration technique of Repatha. Continue taking Rosuvastatin 40 mg daily. Encouraged patient to continue eating a low-carb, low-salt diet and limit eating fast food and decrease his egg intake to at least 1-2 times a week. Encourage patient to continue exercising. Recommend rechecking a fasting lipid panel in 2 months to monitor efficacy of Repatha.   Lorel Monaco, PharmD PGY1 [facility] [address] [place], [place] [address] Phone: [phone]; Fax: [phone]

## 2019-03-15 ENCOUNTER — Other Ambulatory Visit: Payer: Self-pay

## 2019-03-15 ENCOUNTER — Ambulatory Visit (INDEPENDENT_AMBULATORY_CARE_PROVIDER_SITE_OTHER): Payer: Self-pay | Admitting: Pharmacist

## 2019-03-15 DIAGNOSIS — E785 Hyperlipidemia, unspecified: Secondary | ICD-10-CM

## 2019-03-15 NOTE — Patient Instructions (Addendum)
Nice to see you today!  Keep up the good work with exercising. We encourage you to aim for a diet full of vegetables, fruit and lean meats (chicken, [place], fish). Try to limit carbs (bread, pasta, sugar, rice), red meat consumption and fast food. Also try to limit the amount of scrambled eggs to 1-2 times a week.    Your goal LDL is 55 mg/dL, you're currently at 130 mg/dL  Medication Changes: We will contact you once your patient assistance application has been approved.  Start/Inject Repatha once every other week (any day of the week that works for you) into the fatty skin of stomach, upper outer thigh or back of the arm. Clean the site with soap and warm water or an alcohol pad. Keep the medication in the fridge until you are ready to give your dose, then take it out and let warm up to room temperature for 30-60 mins.  Continue taking Rosuvastatin 40 mg daily  Come fasting to recheck your lipid panel on December 15th anytime after 7:30AM.   Please give [place] a call at [phone] with any questions or concerns.

## 2019-03-21 ENCOUNTER — Telehealth: Payer: Self-pay | Admitting: Pharmacist

## 2019-03-21 NOTE — Telephone Encounter (Signed)
Patient approved for Repatha through the [employer] Patient Assistance Program through 03/2019 to 02/2020. Patient has been contacted and instructed to call [employer] to coordinate shipping arrangements to receive Repatha. Discussed the proper administration technique and storage requirements. Follow-up labs have been scheduled for December 15th.

## 2019-04-28 ENCOUNTER — Other Ambulatory Visit: Payer: Self-pay

## 2019-04-28 ENCOUNTER — Encounter (HOSPITAL_COMMUNITY): Payer: Self-pay | Admitting: Emergency Medicine

## 2019-04-28 ENCOUNTER — Inpatient Hospital Stay (HOSPITAL_COMMUNITY)
Admission: EM | Admit: 2019-04-28 | Discharge: 2019-04-30 | DRG: 281 | Disposition: A | Discharge status: Home / Self Care | Payer: Self-pay | Attending: Cardiology | Admitting: Cardiology

## 2019-04-28 ENCOUNTER — Emergency Department (HOSPITAL_COMMUNITY): Payer: Self-pay

## 2019-04-28 DIAGNOSIS — I214 Non-ST elevation (NSTEMI) myocardial infarction: Principal | ICD-10-CM

## 2019-04-28 DIAGNOSIS — Z9861 Coronary angioplasty status: Secondary | ICD-10-CM

## 2019-04-28 DIAGNOSIS — I422 Other hypertrophic cardiomyopathy: Secondary | ICD-10-CM | POA: Diagnosis present

## 2019-04-28 DIAGNOSIS — Z800 Family history of malignant neoplasm of digestive organs: Secondary | ICD-10-CM

## 2019-04-28 DIAGNOSIS — G43909 Migraine, unspecified, not intractable, without status migrainosus: Secondary | ICD-10-CM | POA: Diagnosis present

## 2019-04-28 DIAGNOSIS — E785 Hyperlipidemia, unspecified: Secondary | ICD-10-CM | POA: Diagnosis present

## 2019-04-28 DIAGNOSIS — K219 Gastro-esophageal reflux disease without esophagitis: Secondary | ICD-10-CM | POA: Diagnosis present

## 2019-04-28 DIAGNOSIS — I252 Old myocardial infarction: Secondary | ICD-10-CM

## 2019-04-28 DIAGNOSIS — I10 Essential (primary) hypertension: Secondary | ICD-10-CM | POA: Diagnosis present

## 2019-04-28 DIAGNOSIS — M549 Dorsalgia, unspecified: Secondary | ICD-10-CM | POA: Diagnosis present

## 2019-04-28 DIAGNOSIS — I200 Unstable angina: Secondary | ICD-10-CM

## 2019-04-28 DIAGNOSIS — I129 Hypertensive chronic kidney disease with stage 1 through stage 4 chronic kidney disease, or unspecified chronic kidney disease: Secondary | ICD-10-CM | POA: Diagnosis present

## 2019-04-28 DIAGNOSIS — Z8673 Personal history of transient ischemic attack (TIA), and cerebral infarction without residual deficits: Secondary | ICD-10-CM

## 2019-04-28 DIAGNOSIS — T82855A Stenosis of coronary artery stent, initial encounter: Secondary | ICD-10-CM | POA: Diagnosis present

## 2019-04-28 DIAGNOSIS — I25110 Atherosclerotic heart disease of native coronary artery with unstable angina pectoris: Secondary | ICD-10-CM | POA: Diagnosis present

## 2019-04-28 DIAGNOSIS — Z20828 Contact with and (suspected) exposure to other viral communicable diseases: Secondary | ICD-10-CM | POA: Diagnosis present

## 2019-04-28 DIAGNOSIS — I25119 Atherosclerotic heart disease of native coronary artery with unspecified angina pectoris: Secondary | ICD-10-CM | POA: Diagnosis present

## 2019-04-28 DIAGNOSIS — Z79899 Other long term (current) drug therapy: Secondary | ICD-10-CM

## 2019-04-28 DIAGNOSIS — G8929 Other chronic pain: Secondary | ICD-10-CM | POA: Diagnosis present

## 2019-04-28 DIAGNOSIS — N179 Acute kidney failure, unspecified: Secondary | ICD-10-CM | POA: Diagnosis present

## 2019-04-28 DIAGNOSIS — Z8249 Family history of ischemic heart disease and other diseases of the circulatory system: Secondary | ICD-10-CM

## 2019-04-28 DIAGNOSIS — Z7902 Long term (current) use of antithrombotics/antiplatelets: Secondary | ICD-10-CM

## 2019-04-28 DIAGNOSIS — N182 Chronic kidney disease, stage 2 (mild): Secondary | ICD-10-CM | POA: Diagnosis present

## 2019-04-28 DIAGNOSIS — H9190 Unspecified hearing loss, unspecified ear: Secondary | ICD-10-CM | POA: Diagnosis present

## 2019-04-28 LAB — CBC WITH DIFFERENTIAL/PLATELET
Abs Immature Granulocytes: 0.02 10*3/uL (ref 0.00–0.07)
Basophils Absolute: 0 10*3/uL (ref 0.0–0.1)
Basophils Relative: 1 %
Eosinophils Absolute: 0.1 10*3/uL (ref 0.0–0.5)
Eosinophils Relative: 2 %
HCT: 33.6 % — ABNORMAL LOW (ref 39.0–52.0)
Hemoglobin: 11 g/dL — ABNORMAL LOW (ref 13.0–17.0)
Immature Granulocytes: 0 %
Lymphocytes Relative: 23 %
Lymphs Abs: 2 10*3/uL (ref 0.7–4.0)
MCH: 30.3 pg (ref 26.0–34.0)
MCHC: 32.7 g/dL (ref 30.0–36.0)
MCV: 92.6 fL (ref 80.0–100.0)
Monocytes Absolute: 0.5 10*3/uL (ref 0.1–1.0)
Monocytes Relative: 6 %
Neutro Abs: 5.9 10*3/uL (ref 1.7–7.7)
Neutrophils Relative %: 68 %
Platelets: 323 10*3/uL (ref 150–400)
RBC: 3.63 MIL/uL — ABNORMAL LOW (ref 4.22–5.81)
RDW: 11.9 % (ref 11.5–15.5)
WBC: 8.7 10*3/uL (ref 4.0–10.5)
nRBC: 0 % (ref 0.0–0.2)

## 2019-04-28 LAB — BASIC METABOLIC PANEL
Anion gap: 11 (ref 5–15)
BUN: 19 mg/dL (ref 6–20)
CO2: 23 mmol/L (ref 22–32)
Calcium: 9.1 mg/dL (ref 8.9–10.3)
Chloride: 104 mmol/L (ref 98–111)
Creatinine, Ser: 1.57 mg/dL — ABNORMAL HIGH (ref 0.61–1.24)
GFR calc Af Amer: 56 mL/min — ABNORMAL LOW (ref >60)
GFR calc non Af Amer: 48 mL/min — ABNORMAL LOW (ref >60)
Glucose, Bld: 137 mg/dL — ABNORMAL HIGH (ref 70–99)
Potassium: 3.2 mmol/L — ABNORMAL LOW (ref 3.5–5.1)
Sodium: 138 mmol/L (ref 135–145)

## 2019-04-28 LAB — TROPONIN I (HIGH SENSITIVITY)
Troponin I (High Sensitivity): 8 ng/L (ref <18)
Troponin I (High Sensitivity): 28 ng/L — ABNORMAL HIGH (ref <18)

## 2019-04-28 LAB — PROTIME-INR
INR: 1 (ref 0.8–1.2)
Prothrombin Time: 13.3 seconds (ref 11.4–15.2)

## 2019-04-28 LAB — APTT: aPTT: 30 seconds (ref 24–36)

## 2019-04-28 LAB — D-DIMER, QUANTITATIVE: D-Dimer, Quant: 0.81 ug/mL-FEU — ABNORMAL HIGH (ref 0.00–0.50)

## 2019-04-28 MED ORDER — IOHEXOL 350 MG/ML SOLN
100.0000 mL | Freq: Once | INTRAVENOUS | Status: AC | PRN
  Administered 2019-04-28: 100 mL via INTRAVENOUS

## 2019-04-28 MED ORDER — SODIUM CHLORIDE 0.9 % IV BOLUS
500.0000 mL | Freq: Once | INTRAVENOUS | Status: AC
  Administered 2019-04-28: 500 mL via INTRAVENOUS

## 2019-04-28 MED ORDER — POTASSIUM CHLORIDE CRYS ER 20 MEQ PO TBCR
40.0000 meq | EXTENDED_RELEASE_TABLET | Freq: Once | ORAL | Status: AC
  Administered 2019-04-28: 40 meq via ORAL
  Filled 2019-04-28: qty 2

## 2019-04-28 NOTE — ED Provider Notes (Signed)
[facility] EMERGENCY DEPARTMENT Provider Note   CSN: [id number] Arrival date & time: 04/28/19  2030     History   Chief Complaint Chief Complaint  Patient presents with   Chest Pain    HPI [name] is a 58 y.o. male.     [name] is a 58 y.o. male with a history of CAD with most recent PCI in 2017, hypertension, hyperlipidemia, stroke, apical variant hypertrophic cardiomyopathy, and migraines, who presents to the ED via EMS for evaluation of chest pain.  Patient reports between 5 and 6 PM when he was driving home from work he began experiencing some mild left-sided chest pain.  When he arrived home he sat down to have dinner at about 7:00 chest pain became much more severe.  He describes it as "feeling like someone was pressing there needed to the left side of my chest.  Reports that pain radiated into the left arm and he also began to experience some headache.  He reports his headache felt a bit different than his typical migraine.  He denies any associated vision change, numbness or weakness.  Reports he felt a bit lightheaded when the pain became severe but denies dizziness.  Reports he had some mild nausea but no vomiting.  No syncope.  Felt some mild shortness of breath when chest pain reached its maximum, but has not continued to experience any shortness of breath.  Patient's wife called EMS and they instructed her to give him 1 tablet of nitroglycerin and 4 aspirin, patient reports he had no relief in his pain after aspirin and nitroglycerin.  When EMS arrived initial EKG showed some ST depressions in the inferior lateral leads, patient was given morphine and Zofran and afterwards showed improvement in his EKG.  Patient reports that over the past few weeks he has been experiencing episodes of mild chest pain on an almost daily basis.  He reports some intermittent lightheadedness.  He has been taking his blood pressure medications regularly.  Last saw  his cardiologist about 3 months ago and was noted to have worsening cholesterol and was started on Repatha injections.  He denies any associated abdominal pain has not had any fevers or cough.  Denies lower extremity swelling or pain.  No previous history of DVT or PE.  No other aggravating or alleviating factors.     Past Medical History:  Diagnosis Date   Apical variant hypertrophic cardiomyopathy ([place])    CAD (coronary artery disease)    a. LHC in 2011 with severe dz in D1/D2 and very distal LAD--> too small for PCI and managed medically. b.  LHC in 10/2013 after an abnormal nuclear study w/ non-obst dz/c. 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   HOH (hard of hearing)    rgiht ear   Hyperlipidemia    Hypertension    Migraine headache    Stroke [facility])     Patient Active Problem List   Diagnosis Date Noted   Rising PSA level 03/06/2019   AC (acromioclavicular) arthritis 11/23/2018   Family history of colon cancer 10/19/2018   Lumbar disc  herniation with myelopathy 09/27/2016   Spinal stenosis at L4-L5 level 09/22/2016   Bradycardia    AV block    Thiamine deficiency 11/19/2015   Abnormal brain MRI 11/19/2015   Depression with somatization 11/19/2015   Chronic renal insufficiency, stage II (mild) 07/02/2014   Coronary artery disease involving native coronary artery of native heart with angina pectoris (HCC)    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/2013   Drug noncompliance 03/26/2013   Migraine, unspecified, without mention of intractable migraine without mention of status migrainosus 03/26/2013   GERD  (gastroesophageal reflux disease) 11/14/2012   Routine general medical examination at a health care facility 07/28/2012   ERECTILE DYSFUNCTION 08/12/2010   Hyperlipidemia with target LDL less than 70 10/15/2009   TOBACCO USE 10/15/2009   Essential hypertension 10/15/2009    Past Surgical History:  Procedure Laterality Date   CARDIAC CATHETERIZATION N/A 02/24/2016   Procedure: Left Heart Cath and Coronary Angiography;  Surgeon: Tonny Bollman, MD;  Location: [facility] INVASIVE CV LAB;  Service: Cardiovascular;  Laterality: N/A;   CARDIAC CATHETERIZATION N/A 02/24/2016   Procedure: Coronary Stent Intervention;  Surgeon: Tonny Bollman, MD;  Location: [facility] INVASIVE CV LAB;  Service: Cardiovascular;  Laterality: N/A;   CORONARY ANGIOPLASTY WITH STENT PLACEMENT  2017   INGUINAL HERNIA REPAIR     LEFT HEART CATHETERIZATION WITH CORONARY ANGIOGRAM N/A 11/22/2013   Procedure: LEFT HEART CATHETERIZATION WITH CORONARY ANGIOGRAM;  Surgeon: [name], MD;  Location: [facility] CATH LAB;  Service: Cardiovascular;  Laterality: N/A;   TEE WITHOUT CARDIOVERSION N/A 08/22/2015   Procedure: TRANSESOPHAGEAL ECHOCARDIOGRAM (TEE);  Surgeon: Vesta Mixer, MD;  Location: [facility] ENDOSCOPY;  Service: Cardiovascular;  Laterality: N/A;        Home Medications    Prior to Admission medications   Medication Sig Start Date End Date Taking? Authorizing Provider  amLODipine (NORVASC) 10 MG tablet Take 1 tablet (10 mg total) by mouth daily. 10/18/18   Etta Grandchild, MD  Azilsartan-Chlorthalidone (EDARBYCLOR) 40-25 MG TABS Take 1 tablet by mouth daily. 03/06/19   Etta Grandchild, MD  clopidogrel (PLAVIX) 75 MG tablet TAKE 1 TABLET BY MOUTH EVERY DAY WITH BREAKFEST Patient not taking: Reported on 03/06/2019 04/18/18   Lyn Records, MD  Evolocumab (REPATHA SURECLICK) 140 MG/ML SOAJ Inject 140 mg into the skin every 14 (fourteen) days.    [provider]  hydrALAZINE (APRESOLINE) 100 MG tablet Take 1 tablet (100  mg total) by mouth 3 (three) times daily. 10/18/18   Etta Grandchild, MD  nitroGLYCERIN (NITROSTAT) 0.4 MG SL tablet Place 1 tablet (0.4 mg total) under the tongue every 5 (five) minutes as needed for chest pain. 04/11/18 02/26/19  Lyn Records, MD  rosuvastatin (CRESTOR) 40 MG tablet Take 1 tablet (40 mg total) by mouth daily. 10/18/18   Etta Grandchild, MD  thiamine (CVS B-1) 100 MG tablet Take 1 tablet (100 mg total) by mouth every other day. 03/12/19   Etta Grandchild, MD    Family History Family History  Problem Relation Age of Onset   Hypertension Mother    Heart failure Mother    Hypertension Father    Colon cancer Father 75   Colon cancer Paternal Uncle     Social History Social History   Tobacco Use   Smoking status: Current Some Day Smoker    Packs/day: 0.00    Types: Cigarettes   Smokeless tobacco: Never Used  Substance Use Topics   Alcohol use: No  Alcohol/week: 0.0 standard drinks   Drug use: No     Allergies   Lisinopril   Review of Systems Review of Systems  Constitutional: Negative for chills and fever.  HENT: Negative for congestion, rhinorrhea and sore throat.   Eyes: Negative for visual disturbance.  Respiratory: Negative for cough and shortness of breath.   Cardiovascular: Positive for chest pain.  Gastrointestinal: Positive for nausea. Negative for abdominal pain, diarrhea and vomiting.  Musculoskeletal: Negative for arthralgias and myalgias.  Skin: Negative for color change and rash.  Neurological: Positive for light-headedness and headaches. Negative for dizziness, syncope, facial asymmetry, weakness and numbness.  All other systems reviewed and are negative.    Physical Exam Updated Vital Signs BP 109/63 (BP Location: Right Arm)    Pulse 86    Temp 98.4 F (36.9 C) (Oral)    Resp 18    Ht 5\' 8"  (1.727 m)    Wt 84.8 kg    SpO2 100%    BMI 28.43 kg/m   Physical Exam Vitals signs and nursing note reviewed.  Constitutional:       General: He is not in acute distress.    Appearance: He is well-developed and normal weight. He is not ill-appearing or diaphoretic.  HENT:     Head: Normocephalic and atraumatic.  Eyes:     General:        Right eye: No discharge.        Left eye: No discharge.     Pupils: Pupils are equal, round, and reactive to light.  Neck:     Musculoskeletal: Neck supple.  Cardiovascular:     Rate and Rhythm: Normal rate and regular rhythm.     Pulses:          Radial pulses are 2+ on the right side and 2+ on the left side.       Dorsalis pedis pulses are 2+ on the right side and 2+ on the left side.     Heart sounds: Normal heart sounds. No murmur. No friction rub. No gallop.   Pulmonary:     Effort: Pulmonary effort is normal. No respiratory distress.     Breath sounds: Normal breath sounds. No wheezing or rales.     Comments: Respirations equal and unlabored, patient able to speak in full sentences, lungs clear to auscultation bilaterally Abdominal:     General: Bowel sounds are normal. There is no distension.     Palpations: Abdomen is soft. There is no mass.     Tenderness: There is no abdominal tenderness. There is no guarding.     Comments: Abdomen soft, nondistended, nontender to palpation in all quadrants without guarding or peritoneal signs  Musculoskeletal:        General: No deformity.     Right lower leg: He exhibits no tenderness. No edema.     Left lower leg: He exhibits no tenderness. No edema.  Skin:    General: Skin is warm and dry.     Capillary Refill: Capillary refill takes less than 2 seconds.  Neurological:     Mental Status: He is alert and oriented to person, place, and time.     Coordination: Coordination normal.     Comments: Speech is clear, able to follow commands CN III-XII intact Normal strength in upper and lower extremities bilaterally including dorsiflexion and plantar flexion, strong and equal grip strength Sensation normal to light and sharp  touch Moves extremities without ataxia, coordination intact  ED Treatments / Results  Labs (all labs ordered are listed, but only abnormal results are displayed) Labs Reviewed  BASIC METABOLIC PANEL - Abnormal; Notable for the following components:      Result Value   Potassium 3.2 (*)    Glucose, Bld 137 (*)    Creatinine, Ser 1.57 (*)    GFR calc non Af Amer 48 (*)    GFR calc Af Amer 56 (*)    All other components within normal limits  CBC WITH DIFFERENTIAL/PLATELET - Abnormal; Notable for the following components:   RBC 3.63 (*)    Hemoglobin 11.0 (*)    HCT 33.6 (*)    All other components within normal limits  D-DIMER, QUANTITATIVE (NOT AT [facility]) - Abnormal; Notable for the following components:   D-Dimer, Quant 0.81 (*)    All other components within normal limits  TROPONIN I (HIGH SENSITIVITY) - Abnormal; Notable for the following components:   Troponin I (High Sensitivity) 28 (*)    All other components within normal limits  SARS CORONAVIRUS 2 (TAT 6-24 HRS)  PROTIME-INR  APTT  TROPONIN I (HIGH SENSITIVITY)    EKG EKG Interpretation  Date/Time:  Saturday April 28 2019 20:31:41 EST Ventricular Rate:  87 PR Interval:    QRS Duration: 87 QT Interval:  367 QTC Calculation: 442 R Axis:   76 Text Interpretation: Sinus rhythm Consider left atrial enlargement Consider left ventricular hypertrophy Nonspecific T abnrm, anterolateral leads no acute ischemic appearance, resolution of previously inverted t waves Confirmed by Arby Barrette [id number]) on 04/28/2019 9:11:37 PM   Radiology Ct Angio Chest Pe W And/or Wo Contrast  Result Date: 04/28/2019 CLINICAL DATA:  PE suspected, intermediate probability, positive D-dimer, gradual onset left chest pain EXAM: CT ANGIOGRAPHY CHEST WITH CONTRAST TECHNIQUE: Multidetector CT imaging of the chest was performed using the standard protocol during bolus administration of intravenous contrast. Multiplanar CT image  reconstructions and MIPs were obtained to evaluate the vascular anatomy. CONTRAST:  OMNIPAQUE IOHEXOL 350 MG/ML SOLN COMPARISON:  Chest radiograph 04/28/2019 FINDINGS: Cardiovascular: Satisfactory opacification the pulmonary arteries to the segmental level. No pulmonary artery filling defects are identified. Central pulmonary arteries are normal caliber. Normal heart size. No pericardial effusion. Coronary artery calcification is seen. Thoracic aorta is normal caliber with minimal atherosclerotic plaque. No acute aortic abnormality. The left vertebral artery arises directly from the aortic arch. Mediastinum/Nodes: No enlarged mediastinal, hilar, or axillary lymph nodes. Thyroid gland and thoracic inlet are unremarkable. Posterior bowing of the trachea compatible with imaging during exhalation. High attenuation material within the thoracic esophagus and proximal stomach. Lungs/Pleura: Atelectatic changes posteriorly. No consolidation, features of edema, pneumothorax, or effusion. No suspicious pulmonary nodules or masses. Upper Abdomen: Stomach distended with ingested material including more high attenuation material. No acute abnormalities present in the visualized portions of the upper abdomen. Atherosclerotic calcifications of the imaged abdominal aorta as well. Musculoskeletal: Multilevel degenerative changes are present in the imaged portions of the spine. No acute osseous abnormality or suspicious osseous lesion. Review of the MIP images confirms the above findings. IMPRESSION: 1. No evidence for pulmonary embolus. 2. No acute intrathoracic process. 3. High attenuation material within the thoracic esophagus and proximal stomach, may represent ingested material with some trace reflux. 4. Aortic Atherosclerosis (ICD10-I70.0). Electronically Signed   By: Kreg Shropshire M.D.   On: 04/28/2019 23:59   Dg Chest Port 1 View  Result Date: 04/28/2019 CLINICAL DATA:  Chest pain EXAM: PORTABLE CHEST 1 VIEW  COMPARISON:  07/21/2016 FINDINGS:  Heart and mediastinal contours are within normal limits. No focal opacities or effusions. No acute bony abnormality. IMPRESSION: No active disease. Electronically Signed   By: Charlett NoseKevin  Dover M.D.   On: 04/28/2019 21:38    Procedures Procedures (including critical care time)  Medications Ordered in ED Medications  potassium chloride SA (KLOR-CON) CR tablet 40 mEq (40 mEq Oral Given 04/28/19 2235)  sodium chloride 0.9 % bolus 500 mL (500 mLs Intravenous New Bag/Given 04/28/19 2304)  iohexol (OMNIPAQUE) 350 MG/ML injection 100 mL (100 mLs Intravenous Contrast Given 04/28/19 2331)     Initial Impression / Assessment and Plan / ED Course  I have reviewed the triage vital signs and the nursing notes.  Pertinent labs & imaging results that were available during my care of the patient were reviewed by me and considered in my medical decision making (see chart for details).  58 year old male presents with episode of severe left-sided chest pain that began when he was returning home from work.  Pain radiates into the left arm.  He also reports associated headache but with no other focal neurologic symptoms.  Patient reports he has been having more mild episodes of chest pain over the last few weeks prior to this severe episode tonight.  He was not initially relieved with nitroglycerin or aspirin but after patient was given morphine with EMS his pain went from 10/10-4/10.  When EMS arrived initially EKG showed inferior lateral depressions, but no ST elevations.  After morphine patient's EKG appeared to be improved.  On arrival to the department patient continues to complain of 4/10 chest pain.  He denies any associated abdominal pain.  Pain does not radiate to the back but given that pain was also associated with headache, considered dissection, but patient has equal pulses in all extremities and I feel this is less likely.  Patient does not have risk factors for PE aside from  his age.  He does not have lower extremity edema or swelling.  EKG on arrival to the ED shows some nonspecific T wave abnormalities but no other significant ischemic changes.  Basic labs troponin and coags ordered, as well as chest x-ray.  Patient's chest x-ray is unremarkable.  Labs show stable hemoglobin at 11, creatinine slightly elevated at 1.57 but no other significant electrolyte derangements.  Initial troponin is 8, which is somewhat reassuring but I am still very concerned for patient story, feel it is likely indicative of unstable angina.  Will discuss with cardiology.  Case discussed with Greggory StallionGeorge, cardiology fellow who does agree that chest pain story is concerning, but given initial troponin is minimally elevated questions if there could be a noncardiac cause for patient's pain, recommends getting D-dimer which will help assess for both PE and dissection, if elevated will proceed with CT angio of the chest.  We will also get delta troponin.  Cardiology fellow states that if troponin or D-dimer elevated they will plan to admit patient to the cardiology service.  Patient's troponin has gone from 8 to 28, D-dimer was elevated at 0.81, but CTA shows no evidence of PE or dissection.  Discussed with cardiology fellow who recommends starting patient on heparin and he will see and admit the patient to cardiology service.  Final Clinical Impressions(s) / ED Diagnoses   Final diagnoses:  Unstable angina [facility](HCC)    ED Discharge Orders    None       Dartha LodgeFord, Shevawn Langenberg N, [place] JerseyPA-C 04/29/19 1048    Arby BarrettePfeiffer, Marcy, MD 05/14/19 [phone]

## 2019-04-28 NOTE — ED Triage Notes (Signed)
Pt present sfrom home for CP. Was driving home from work when he began experiencing gradual onset CP in L chest. Once home, became 10/10 pain and called EMS. When EMS arrived, had taken 324mg  ASA and 1 dose of nitro without relief and was diaphoretic sitting on his floor. Initially, BP 148/80, HR 130bpm. Given 4mg  zofran, 4mg  morphine which improved pain from 10 to 4/10 with improvements to EKG per EMS.   H/o HTN , stroke, STEMI with stent placement  Arrival BP 104/64, HR 98

## 2019-04-29 ENCOUNTER — Encounter (HOSPITAL_COMMUNITY): Payer: Self-pay

## 2019-04-29 ENCOUNTER — Inpatient Hospital Stay (HOSPITAL_COMMUNITY): Payer: Self-pay

## 2019-04-29 DIAGNOSIS — I200 Unstable angina: Secondary | ICD-10-CM

## 2019-04-29 DIAGNOSIS — I214 Non-ST elevation (NSTEMI) myocardial infarction: Secondary | ICD-10-CM

## 2019-04-29 DIAGNOSIS — I10 Essential (primary) hypertension: Secondary | ICD-10-CM

## 2019-04-29 LAB — CBC
HCT: 31.9 % — ABNORMAL LOW (ref 39.0–52.0)
Hemoglobin: 10.5 g/dL — ABNORMAL LOW (ref 13.0–17.0)
MCH: 29.8 pg (ref 26.0–34.0)
MCHC: 32.9 g/dL (ref 30.0–36.0)
MCV: 90.6 fL (ref 80.0–100.0)
Platelets: 291 10*3/uL (ref 150–400)
RBC: 3.52 MIL/uL — ABNORMAL LOW (ref 4.22–5.81)
RDW: 12 % (ref 11.5–15.5)
WBC: 6.7 10*3/uL (ref 4.0–10.5)
nRBC: 0 % (ref 0.0–0.2)

## 2019-04-29 LAB — BASIC METABOLIC PANEL
Anion gap: 9 (ref 5–15)
BUN: 21 mg/dL — ABNORMAL HIGH (ref 6–20)
CO2: 24 mmol/L (ref 22–32)
Calcium: 9 mg/dL (ref 8.9–10.3)
Chloride: 107 mmol/L (ref 98–111)
Creatinine, Ser: 1.7 mg/dL — ABNORMAL HIGH (ref 0.61–1.24)
GFR calc Af Amer: 51 mL/min — ABNORMAL LOW (ref >60)
GFR calc non Af Amer: 44 mL/min — ABNORMAL LOW (ref >60)
Glucose, Bld: 84 mg/dL (ref 70–99)
Potassium: 4 mmol/L (ref 3.5–5.1)
Sodium: 140 mmol/L (ref 135–145)

## 2019-04-29 LAB — LIPID PANEL
Cholesterol: 76 mg/dL (ref 0–200)
HDL: 37 mg/dL — ABNORMAL LOW (ref >40)
LDL Cholesterol: 32 mg/dL (ref 0–99)
Total CHOL/HDL Ratio: 2.1 RATIO
Triglycerides: 33 mg/dL (ref <150)
VLDL: 7 mg/dL (ref 0–40)

## 2019-04-29 LAB — ECHOCARDIOGRAM COMPLETE
Height: 68 in
Weight: 2715.2 oz

## 2019-04-29 LAB — BRAIN NATRIURETIC PEPTIDE: B Natriuretic Peptide: 12 pg/mL (ref 0.0–100.0)

## 2019-04-29 LAB — HEPARIN LEVEL (UNFRACTIONATED)
Heparin Unfractionated: 0.32 IU/mL (ref 0.30–0.70)
Heparin Unfractionated: 0.25 IU/mL — ABNORMAL LOW (ref 0.30–0.70)

## 2019-04-29 LAB — SARS CORONAVIRUS 2 (TAT 6-24 HRS): SARS Coronavirus 2: NEGATIVE

## 2019-04-29 LAB — TROPONIN I (HIGH SENSITIVITY): Troponin I (High Sensitivity): 46 ng/L — ABNORMAL HIGH (ref <18)

## 2019-04-29 LAB — HEMOGLOBIN A1C
Hgb A1c MFr Bld: 5.3 % (ref 4.8–5.6)
Mean Plasma Glucose: 105.41 mg/dL

## 2019-04-29 MED ORDER — HEPARIN BOLUS VIA INFUSION
4000.0000 [IU] | Freq: Once | INTRAVENOUS | Status: AC
  Administered 2019-04-29: 4000 [IU] via INTRAVENOUS
  Filled 2019-04-29: qty 4000

## 2019-04-29 MED ORDER — SODIUM CHLORIDE 0.9% FLUSH: 3.0000 mL | INTRAVENOUS | Status: DC | PRN

## 2019-04-29 MED ORDER — SODIUM CHLORIDE 0.9 % WEIGHT BASED INFUSION
1.0000 mL/kg/h | INTRAVENOUS | Status: DC
  Administered 2019-04-30: 1 mL/kg/h via INTRAVENOUS

## 2019-04-29 MED ORDER — ASPIRIN 81 MG PO CHEW: 324.0000 mg | CHEWABLE_TABLET | ORAL | Status: AC

## 2019-04-29 MED ORDER — SODIUM CHLORIDE 0.9% FLUSH
3.0000 mL | Freq: Two times a day (BID) | INTRAVENOUS | Status: DC
  Administered 2019-04-30: 3 mL via INTRAVENOUS

## 2019-04-29 MED ORDER — ROSUVASTATIN CALCIUM 20 MG PO TABS
40.0000 mg | ORAL_TABLET | Freq: Every day | ORAL | Status: DC
  Administered 2019-04-29 – 2019-04-30 (×2): 40 mg via ORAL
  Filled 2019-04-29 (×2): qty 2

## 2019-04-29 MED ORDER — ASPIRIN 81 MG PO CHEW
81.0000 mg | CHEWABLE_TABLET | ORAL | Status: AC
  Administered 2019-04-30: 81 mg via ORAL
  Filled 2019-04-29: qty 1

## 2019-04-29 MED ORDER — HEPARIN (PORCINE) 25000 UT/250ML-% IV SOLN
1200.0000 [IU]/h | INTRAVENOUS | Status: DC
  Administered 2019-04-29: 1200 [IU]/h via INTRAVENOUS
  Administered 2019-04-29: 1000 [IU]/h via INTRAVENOUS
  Filled 2019-04-29 (×2): qty 250

## 2019-04-29 MED ORDER — SODIUM CHLORIDE 0.9 % IV SOLN: 250.0000 mL | INTRAVENOUS | Status: DC | PRN

## 2019-04-29 MED ORDER — ASPIRIN EC 81 MG PO TBEC: 81.0000 mg | DELAYED_RELEASE_TABLET | Freq: Every day | ORAL | Status: DC

## 2019-04-29 MED ORDER — SODIUM CHLORIDE 0.9 % IV BOLUS
1000.0000 mL | Freq: Once | INTRAVENOUS | Status: AC
  Administered 2019-04-29: 1000 mL via INTRAVENOUS

## 2019-04-29 MED ORDER — ACETAMINOPHEN 325 MG PO TABS: 650.0000 mg | ORAL_TABLET | ORAL | Status: DC | PRN

## 2019-04-29 MED ORDER — NITROGLYCERIN 0.4 MG SL SUBL: 0.4000 mg | SUBLINGUAL_TABLET | SUBLINGUAL | Status: DC | PRN

## 2019-04-29 MED ORDER — ASPIRIN 300 MG RE SUPP: 300.0000 mg | RECTAL | Status: AC

## 2019-04-29 MED ORDER — ONDANSETRON HCL 4 MG/2ML IJ SOLN: 4.0000 mg | Freq: Four times a day (QID) | INTRAMUSCULAR | Status: DC | PRN

## 2019-04-29 MED ORDER — SODIUM CHLORIDE 0.9 % WEIGHT BASED INFUSION
3.0000 mL/kg/h | INTRAVENOUS | Status: DC
  Administered 2019-04-30: 3 mL/kg/h via INTRAVENOUS

## 2019-04-29 NOTE — ED Notes (Signed)
Report called to [place] rn

## 2019-04-29 NOTE — Progress Notes (Signed)
ANTICOAGULATION CONSULT NOTE - Initial Consult  Pharmacy Consult for heparin Indication: chest pain/ACS  Allergies  Allergen Reactions  . Lisinopril Swelling    Facial swelling    Patient Measurements: Height: 5\' 8"  (172.7 cm) Weight: 169 lb 11.2 oz (77 kg) IBW/kg (Calculated) : 68.4 Heparin Dosing Weight: 77 kg  Vital Signs: Temp: 98.2 F (36.8 C) (11/29 0505) Temp Source: Oral (11/29 0505) BP: 111/61 (11/29 0505) Pulse Rate: 65 (11/29 0505)  Labs: Recent Labs    04/28/19 2034 04/28/19 2248 04/29/19 0623 04/29/19 0738  HGB 11.0*  --  10.5*  --   HCT 33.6*  --  31.9*  --   PLT 323  --  291  --   APTT 30  --   --   --   LABPROT 13.3  --   --   --   INR 1.0  --   --   --   HEPARINUNFRC  --   --   --  0.32  CREATININE 1.57*  --   --   --   TROPONINIHS 8 28* 46*  --     Estimated Creatinine Clearance: 50.2 mL/min (A) (by C-G formula based on SCr of 1.57 mg/dL (H)).   Medical History: Past Medical History:  Diagnosis Date  . Apical variant hypertrophic cardiomyopathy ([place])   . CAD (coronary artery disease)    a. LHC in 2011 with severe dz in D1/D2 and very distal LAD--> too small for PCI and managed medically. b.  LHC in 10/2013 after an abnormal nuclear study w/ non-obst dz/c. 01/2016 NSTEMI 2.5 x 38 mm Promus DES to Ramus    . Chronic back pain   . 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  Marland Kitchen HOH (hard of hearing)    rgiht ear  . Hyperlipidemia   . Hypertension   . Migraine headache   . Stroke [facility])     Assessment: 58 yo man to start heparin for CP.  He was not on anticoagulation PTA.  First heparin level (0.32) on lower end of therapeutic on drip rate 1000 units/hr. CBC stable. No overt bleeding or  infusion issues per RN. Will increase drip rate slightly to keep therapeutic.  Goal of Therapy:  Heparin level 0.3-0.7 units/ml Monitor platelets by anticoagulation protocol: Yes   Plan:  Increase Heparin IV to 1050 units/hr Check 6-hr heparin level Daily heparin level and CBC Monitor for bleeding compliations  Richardine Service, PharmD PGY1 Pharmacy Resident Phone: [phone] 04/29/2019  8:13 AM  Please check [email or web address] for unit-specific pharmacy phone numbers.

## 2019-04-29 NOTE — Progress Notes (Signed)
*  PRELIMINARY RESULTS* Echocardiogram 2D Echocardiogram has been performed.  [name] 04/29/2019, 3:49 PM

## 2019-04-29 NOTE — Progress Notes (Signed)
[place] for heparin Indication: chest pain/ACS  Allergies  Allergen Reactions  . Lisinopril Swelling    Facial swelling    Patient Measurements: Height: 5\' 8"  (172.7 cm) Weight: 169 lb 11.2 oz (77 kg) IBW/kg (Calculated) : 68.4 Heparin Dosing Weight: 77 kg  Vital Signs: Temp: 98.2 F (36.8 C) (11/29 0505) Temp Source: Oral (11/29 0505) BP: 111/61 (11/29 0505) Pulse Rate: 65 (11/29 0505)  Labs: Recent Labs    04/28/19 2034 04/28/19 2248 04/29/19 0623 04/29/19 0738 04/29/19 1440  HGB 11.0*  --  10.5*  --   --   HCT 33.6*  --  31.9*  --   --   PLT 323  --  291  --   --   APTT 30  --   --   --   --   LABPROT 13.3  --   --   --   --   INR 1.0  --   --   --   --   HEPARINUNFRC  --   --   --  0.32 0.25*  CREATININE 1.57*  --  1.70*  --   --   TROPONINIHS 8 28* 46*  --   --     Estimated Creatinine Clearance: 46.4 mL/min (A) (by C-G formula based on SCr of 1.7 mg/dL (H)).   Medical History: Past Medical History:  Diagnosis Date  . Apical variant hypertrophic cardiomyopathy ([place])   . CAD (coronary artery disease)    a. LHC in 2011 with severe dz in D1/D2 and very distal LAD--> too small for PCI and managed medically. b.  LHC in 10/2013 after an abnormal nuclear study w/ non-obst dz/c. 01/2016 NSTEMI 2.5 x 38 mm Promus DES to Ramus    . Chronic back pain   . 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  Marland Kitchen HOH (hard of hearing)    rgiht ear  . Hyperlipidemia   . Hypertension   . Migraine headache   . Stroke [facility])     Assessment: 58 yo man to start heparin for CP.  He was not on anticoagulation PTA.  Heparin level came back slightly subthearpeutic at 0.25, on 1050 units/hr.  CBC stable. No s/sx of bleeding or infusion issues.   Goal of Therapy:  Heparin level 0.3-0.7 units/ml Monitor platelets by anticoagulation protocol: Yes   Plan:  Increase Heparin IV to 1200 units/hr Daily heparin level and CBC Monitor for bleeding compliations  Antonietta Jewel, PharmD, BCCCP Clinical Pharmacist  Phone: [phone]  Please check AMION for all [place] phone numbers After 10:00 PM, call [place] [phone] 04/29/2019  3:28 PM  Please check [email or web address] for unit-specific pharmacy phone numbers.

## 2019-04-29 NOTE — Progress Notes (Signed)
ANTICOAGULATION CONSULT NOTE - Initial Consult  Pharmacy Consult for heparin Indication: chest pain/ACS  Allergies  Allergen Reactions  . Lisinopril Swelling    Facial swelling    Patient Measurements: Height: 5\' 8"  (172.7 cm) Weight: 187 lb (84.8 kg) IBW/kg (Calculated) : 68.4 Heparin Dosing Weight: 84.8 kg  Vital Signs: Temp: 98.4 F (36.9 C) (11/28 2040) Temp Source: Oral (11/28 2040) BP: 106/62 (11/28 2315) Pulse Rate: 72 (11/28 2315)  Labs: Recent Labs    04/28/19 2034 04/28/19 2248  HGB 11.0*  --   HCT 33.6*  --   PLT 323  --   APTT 30  --   LABPROT 13.3  --   INR 1.0  --   CREATININE 1.57*  --   TROPONINIHS 8 28*    Estimated Creatinine Clearance: 55.1 mL/min (A) (by C-G formula based on SCr of 1.57 mg/dL (H)).   Medical History: Past Medical History:  Diagnosis Date  . Apical variant hypertrophic cardiomyopathy ([place])   . CAD (coronary artery disease)    a. LHC in 2011 with severe dz in D1/D2 and very distal LAD--> too small for PCI and managed medically. b.  LHC in 10/2013 after an abnormal nuclear study w/ non-obst dz/c. 01/2016 NSTEMI 2.5 x 38 mm Promus DES to Ramus    . Chronic back pain   . 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  Marland Kitchen HOH (hard of hearing)    rgiht ear  . Hyperlipidemia   . Hypertension   . Migraine headache   . Stroke [facility])     Medications:  See medication history  Assessment: 58 yo man to start heparin for CP.  He was not on anticoagulation PTA.  Hg 11.0, PTLC 323 Goal of Therapy:  Heparin level 0.3-0.7 units/ml Monitor platelets by anticoagulation protocol: Yes   Plan:  Heparin 4000 unit bolus and drip at 1000 units/hr Check heparin level 6-8 hours after  start Daily HL and CBC while on heparin Monitor for bleeding compliations  Llesenia Fogal Poteet 04/29/2019,12:18 AM

## 2019-04-29 NOTE — ED Notes (Signed)
ED TO INPATIENT HANDOFF REPORT  ED Nurse Name and Phone #: Wyline CopasLouie, RN [phone]  S Name/Age/Gender [name] 58 y.o. male Room/Bed: 023C/023C  Code Status   Code Status: Prior  Home/SNF/Other Home Patient oriented to: self, place, time and situation Is this baseline? No   Triage Complete: Triage complete  Chief Complaint CP  Triage Note Pt present sfrom home for CP. Was driving home from work when he began experiencing gradual onset CP in L chest. Once home, became 10/10 pain and called EMS. When EMS arrived, had taken 324mg  ASA and 1 dose of nitro without relief and was diaphoretic sitting on his floor. Initially, BP 148/80, HR 130bpm. Given 4mg  zofran, 4mg  morphine which improved pain from 10 to 4/10 with improvements to EKG per EMS.   H/o HTN , stroke, STEMI with stent placement  Arrival BP 104/64, HR 98   Allergies Allergies  Allergen Reactions  . Lisinopril Swelling    Facial swelling    Level of Care/Admitting Diagnosis ED Disposition    ED Disposition Condition Comment   Admit  The patient appears reasonably stabilized for admission considering the current resources, flow, and capabilities available in the ED at this time, and I doubt any [facility] requiring further screening and/or treatment in the ED prior to admission is  present.       B Medical/Surgery History Past Medical History:  Diagnosis Date  . Apical variant hypertrophic cardiomyopathy ([facility])   . CAD (coronary artery disease)    a. LHC in 2011 with severe dz in D1/D2 and very distal LAD--> too small for PCI and managed medically. b.  LHC in 10/2013 after an abnormal nuclear study w/ non-obst dz/c. 01/2016 NSTEMI 2.5 x 38 mm Promus DES to Ramus    . Chronic back pain   . 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  Marland Kitchen. HOH (hard of hearing)    rgiht ear  . Hyperlipidemia   . Hypertension   . Migraine headache   . Stroke [facility](HCC)    Past Surgical History:  Procedure Laterality Date  . CARDIAC CATHETERIZATION N/A 02/24/2016   Procedure: Left Heart Cath and Coronary Angiography;  Surgeon: [name], MD;  [facility];  Service: Cardiovascular;  Laterality: N/A;  . CARDIAC CATHETERIZATION N/A 02/24/2016   Procedure: Coronary Stent Intervention;  Surgeon: [name], MD;  [facility];  Service: Cardiovascular;  Laterality: N/A;  . CORONARY ANGIOPLASTY WITH STENT PLACEMENT  2017  . INGUINAL HERNIA REPAIR    . LEFT HEART CATHETERIZATION WITH CORONARY ANGIOGRAM N/A 11/22/2013   Procedure: LEFT HEART CATHETERIZATION WITH CORONARY ANGIOGRAM;  Surgeon: [name], MD;  [facility];  Service: Cardiovascular;  Laterality: N/A;  . TEE WITHOUT CARDIOVERSION N/A 08/22/2015   Procedure: TRANSESOPHAGEAL ECHOCARDIOGRAM (TEE);  Surgeon: Vesta MixerPhilip J Nahser, MD;  [facility];  Service: Cardiovascular;  Laterality: N/A;     A IV Location/Drains/Wounds Patient Lines/Drains/Airways Status   Active Line/Drains/Airways    Name:   Placement date:   Placement time:   Site:   Days:   Peripheral IV 04/28/19 Anterior;Distal;Left;Upper Arm   04/28/19    2032    Arm   1  Wound / Incision (Open or Dehisced) 01/08/15 Laceration Hand Right   01/08/15    2213    Hand   1572          Intake/Output Last 24 hours No intake or output data in the 24 hours ending 04/29/19 0054  Labs/Imaging Results for orders placed or performed during the hospital encounter of 04/28/19 (from the past 48 hour(s))  Basic metabolic panel     Status: Abnormal   Collection Time: 04/28/19  8:34 PM  Result Value Ref Range   Sodium 138 135 - 145 mmol/L   Potassium 3.2 (L) 3.5 - 5.1 mmol/L   Chloride 104 98 - 111  mmol/L   CO2 23 22 - 32 mmol/L   Glucose, Bld 137 (H) 70 - 99 mg/dL   BUN 19 6 - 20 mg/dL   Creatinine, Ser 6.21 (H) 0.61 - 1.24 mg/dL   Calcium 9.1 8.9 - 30.8 mg/dL   GFR calc non Af Amer 48 (L) >60 mL/min   GFR calc Af Amer 56 (L) >60 mL/min   Anion gap 11 5 - 15    Comment: Performed at [facility], [address], [place], [place] [address]  Troponin I (High Sensitivity)     Status: None   Collection Time: 04/28/19  8:34 PM  Result Value Ref Range   Troponin I (High Sensitivity) 8 <18 ng/L    Comment: (NOTE) Elevated high sensitivity troponin I (hsTnI) values and significant  changes across serial measurements may suggest ACS but many other  chronic and acute conditions are known to elevate hsTnI results.  Refer to the "Links" section for chest pain algorithms and additional  guidance. Performed at [facility], [address], [place], [place] [address]   CBC with Differential     Status: Abnormal   Collection Time: 04/28/19  8:34 PM  Result Value Ref Range   WBC 8.7 4.0 - 10.5 K/uL   RBC 3.63 (L) 4.22 - 5.81 MIL/uL   Hemoglobin 11.0 (L) 13.0 - 17.0 g/dL   HCT 52.8 (L) 41.3 - 24.4 %   MCV 92.6 80.0 - 100.0 fL   MCH 30.3 26.0 - 34.0 pg   MCHC 32.7 30.0 - 36.0 g/dL   RDW 01.0 27.2 - 53.6 %   Platelets 323 150 - 400 K/uL   nRBC 0.0 0.0 - 0.2 %   Neutrophils Relative % 68 %   Neutro Abs 5.9 1.7 - 7.7 K/uL   Lymphocytes Relative 23 %   Lymphs Abs 2.0 0.7 - 4.0 K/uL   Monocytes Relative 6 %   Monocytes Absolute 0.5 0.1 - 1.0 K/uL   Eosinophils Relative 2 %   Eosinophils Absolute 0.1 0.0 - 0.5 K/uL   Basophils Relative 1 %   Basophils Absolute 0.0 0.0 - 0.1 K/uL   Immature Granulocytes 0 %   Abs Immature Granulocytes 0.02 0.00 - 0.07 K/uL    Comment: Performed at [facility], [address] [place], [place] [address]  Protime-INR     Status: None   Collection Time: 04/28/19  8:34 PM  Result Value Ref Range   Prothrombin Time 13.3 11.4 - 15.2  seconds   INR 1.0 0.8 - 1.2    Comment: (NOTE) INR goal varies based on device and disease states. Performed at [facility], [address], [place], [place] [address]   APTT     Status: None   Collection Time: 04/28/19  8:34  PM  Result Value Ref Range   aPTT 30 24 - 36 seconds    Comment: Performed at [facility], [address], [place], [place] [address]  D-dimer, quantitative (not at [facility])     Status: Abnormal   Collection Time: 04/28/19 10:04 PM  Result Value Ref Range   D-Dimer, Quant 0.81 (H) 0.00 - 0.50 ug/mL-FEU    Comment: (NOTE) At the manufacturer cut-off of 0.50 ug/mL FEU, this assay has been documented to exclude PE with a sensitivity and negative predictive value of 97 to 99%.  At this time, this assay has not been approved by the FDA to exclude DVT/VTE. Results should be correlated with clinical presentation. Performed at [facility] Lab, [address] [place], [place] [address]   Troponin I (High Sensitivity)     Status: Abnormal   Collection Time: 04/28/19 10:48 PM  Result Value Ref Range   Troponin I (High Sensitivity) 28 (H) <18 ng/L    Comment: RESULT CALLED TO, READ BACK BY AND VERIFIED WITH: RN L Storie Heffern @2346  04/28/19 BY S GEZAHEGN (NOTE) Elevated high sensitivity troponin I (hsTnI) values and significant  changes across serial measurements may suggest ACS but many other  chronic and acute conditions are known to elevate hsTnI results.  Refer to the Links section for chest pain algorithms and additional  guidance. Performed at [facility], [address] [place], [place] [address]    Ct Angio Chest Pe W And/or Wo Contrast  Result Date: 04/28/2019 CLINICAL DATA:  PE suspected, intermediate probability, positive D-dimer, gradual onset left chest pain EXAM: CT ANGIOGRAPHY CHEST WITH CONTRAST TECHNIQUE: Multidetector CT imaging of the chest was performed using the standard protocol during bolus administration of intravenous  contrast. Multiplanar CT image reconstructions and MIPs were obtained to evaluate the vascular anatomy. CONTRAST:  OMNIPAQUE IOHEXOL 350 MG/ML SOLN COMPARISON:  Chest radiograph 04/28/2019 FINDINGS: Cardiovascular: Satisfactory opacification the pulmonary arteries to the segmental level. No pulmonary artery filling defects are identified. Central pulmonary arteries are normal caliber. Normal heart size. No pericardial effusion. Coronary artery calcification is seen. Thoracic aorta is normal caliber with minimal atherosclerotic plaque. No acute aortic abnormality. The left vertebral artery arises directly from the aortic arch. Mediastinum/Nodes: No enlarged mediastinal, hilar, or axillary lymph nodes. Thyroid gland and thoracic inlet are unremarkable. Posterior bowing of the trachea compatible with imaging during exhalation. High attenuation material within the thoracic esophagus and proximal stomach. Lungs/Pleura: Atelectatic changes posteriorly. No consolidation, features of edema, pneumothorax, or effusion. No suspicious pulmonary nodules or masses. Upper Abdomen: Stomach distended with ingested material including more high attenuation material. No acute abnormalities present in the visualized portions of the upper abdomen. Atherosclerotic calcifications of the imaged abdominal aorta as well. Musculoskeletal: Multilevel degenerative changes are present in the imaged portions of the spine. No acute osseous abnormality or suspicious osseous lesion. Review of the MIP images confirms the above findings. IMPRESSION: 1. No evidence for pulmonary embolus. 2. No acute intrathoracic process. 3. High attenuation material within the thoracic esophagus and proximal stomach, may represent ingested material with some trace reflux. 4. Aortic Atherosclerosis (ICD10-I70.0). Electronically Signed   By: Kreg Shropshire M.D.   On: 04/28/2019 23:59   Dg Chest Port 1 View  Result Date: 04/28/2019 CLINICAL DATA:  Chest pain  EXAM: PORTABLE CHEST 1 VIEW COMPARISON:  07/21/2016 FINDINGS: Heart and mediastinal contours are within normal limits. No focal opacities or effusions. No acute bony abnormality. IMPRESSION: No active disease. Electronically  Signed   By: Rolm Baptise M.D.   On: 04/28/2019 21:38    Pending Labs Unresulted Labs (From admission, onward)    Start     Ordered   04/30/19 0500  Heparin level (unfractionated)  Daily,   R     04/29/19 0022   04/30/19 0500  CBC  Daily,   R     04/29/19 0022   04/29/19 0800  Heparin level (unfractionated)  Once-Timed,   STAT     04/29/19 0022   04/29/19 0015  SARS CORONAVIRUS 2 (TAT 6-24 HRS) Nasopharyngeal Nasopharyngeal Swab  (Asymptomatic/Tier 3)  Once,   STAT    Question Answer Comment  Is this test for diagnosis or screening Screening   Symptomatic for COVID-19 as defined by CDC No   Hospitalized for COVID-19 No   Admitted to ICU for COVID-19 No   Previously tested for COVID-19 No   Resident in a congregate (group) care setting No   Employed in healthcare setting No      04/29/19 0014          Vitals/Pain Today's Vitals   04/29/19 0000 04/29/19 0015 04/29/19 0030 04/29/19 0045  BP:   102/63 100/60  Pulse:  75  74  Resp: (!) 28 16 18 19   Temp:      TempSrc:      SpO2:  99%  98%  Weight:      Height:      PainSc:        Isolation Precautions No active isolations  Medications Medications  heparin ADULT infusion 100 units/mL (25000 units/253mL sodium chloride 0.45%) (1,000 Units/hr Intravenous New Bag/Given 04/29/19 0046)  potassium chloride SA (KLOR-CON) CR tablet 40 mEq (40 mEq Oral Given 04/28/19 2235)  sodium chloride 0.9 % bolus 500 mL (0 mLs Intravenous Stopped 04/29/19 0040)  iohexol (OMNIPAQUE) 350 MG/ML injection 100 mL (100 mLs Intravenous Contrast Given 04/28/19 2331)  heparin bolus via infusion 4,000 Units (4,000 Units Intravenous Bolus from Bag 04/29/19 0047)    Mobility walks Low fall risk   Focused Assessments Cardiac  Assessment Handoff:  Cardiac Rhythm: Normal sinus rhythm Lab Results  Component Value Date   CKTOTAL 237 (H) 03/05/2011   CKMB 2.6 03/05/2011   TROPONINI 0.09 (HH) 02/24/2016   Lab Results  Component Value Date   DDIMER 0.81 (H) 04/28/2019   Does the Patient currently have chest pain? No     R Recommendations: See Admitting Provider Note  Report given to:   Additional Notes:

## 2019-04-29 NOTE — H&P (Addendum)
Cardiology History & Physical    Patient ID: [name] MRN: [id number], DOB/AGE: [birth year]   Admit date: 04/28/2019  Primary Physician: [name], MD Primary Cardiologist: [name], MD  Patient Profile    58 year old man with CAD s/p prior PCI to large RI 2017, HTN, HLD (on rosuvastatin and repatha), "documented AVB during sleep," "apical variant hypertrophic cardiomyopathy," prior CVA, who presented to the ED with concerning story of chest pain, now admitted to cardiology team.   Patient last saw Dr. Tamala Julian 02/26/2019, discontinued aspirin and continued plavix 75 at that visit, LDL not at target so follows in lipid clinic.   History of Present Illness    Today, patient presented to ED with several days of worsening CP, was particularly bad last night while driving home from work, EMS called and gave 324 ASA and nitro with minimal relief. Per EMS he reportedly also had some ST depressions on his strip, although I cannot find strips in his chart. In ED initial troponin 8 but repeat 28, D-dimer positive, CTA negative for PE, no evidence for dissection, some evidence for reflux noted. Heparin drip was started, chest pain resolved when I see him.   Patient reports several days of worsening exertional CP occasionally occurring at rest, last night happened at rest while driving home and was very bad (10/10 for a while) and did not relent until in the ED. He says pain was deep, radiates some to left arm, associated with dyspnea and nausea, and reminds him of his prior heart attack. He denies any syncope, palpitations, edema, orthopnea, or PND. He does report fatigue lately, has stress at his new job and lost some of his appetite. He also notes he has been getting light headed when he stands up too quickly recently, notably his BP is on the softer side in the ED today (90-100's / 50's-60's). He did take his amlodipine and hydralazine yesterday.   Review of Systems   [y] = yes,  [ ]  = no      General: Weight gain [ ] ; Weight loss [ ] ; Fever [ ]   Cardiac: Chest pain/pressure [ y]; Resting SOB [ ] ; Exertional SOB [ ] ; Orthopnea [ ] ; Paroxysmal nocturnal dyspnea[ ] ; Lower extremity Edema [ ] ; Palpitations [ ] ; Syncope [ ] ; Presyncope [ ]   Pulmonary: Cough [ ] ; Hemoptysis[ ]   GI: Vomiting[ ] ; Dysphagia[ ] ; Melena[ ] ; Hematochezia [ ] ; Heartburn[ ] ; Diarrhea [ ] ; BRBPR [ ]     GU: Hematuria[ ] ; Dysuria [ ]   Vascular: Pain in legs with walking [ ] ; Non-healing sores [ ]   Neuro: Stroke [ ] ; TIA [ ] ; Vision changes [ ]     Ortho/Skin: back pain [ ] ; Rash [ ]     Heme: Bleeding problems [ ] ; Clotting disorders [ ] ; Anemia [ ]     Endocrine: Diabetes [ ] ; Thyroid dysfunction[ ]   Past Medical History   Past Medical History:  Diagnosis Date   Apical variant hypertrophic cardiomyopathy ([place])    CAD (coronary artery disease)    a. LHC in 2011 with severe dz in D1/D2 and very distal LAD--> too small for PCI and managed medically. b.  LHC in 10/2013 after an abnormal nuclear study w/ non-obst dz/c. 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   HOH (hard of hearing)    rgiht ear   Hyperlipidemia    Hypertension    Migraine headache    Stroke [facility](HCC)     Past Surgical History:  Procedure Laterality Date   CARDIAC CATHETERIZATION N/A 02/24/2016   Procedure: Left Heart Cath and Coronary Angiography;  Surgeon: [name], MD;  [facility];  Service: Cardiovascular;  Laterality: N/A;   CARDIAC CATHETERIZATION N/A 02/24/2016   Procedure: Coronary Stent Intervention;  Surgeon: [name], MD;  [facility];   Service: Cardiovascular;  Laterality: N/A;   CORONARY ANGIOPLASTY WITH STENT PLACEMENT  2017   INGUINAL HERNIA REPAIR     LEFT HEART CATHETERIZATION WITH CORONARY ANGIOGRAM N/A 11/22/2013   Procedure: LEFT HEART CATHETERIZATION WITH CORONARY ANGIOGRAM;  Surgeon: [name], MD;  [facility];  Service: Cardiovascular;  Laterality: N/A;   TEE WITHOUT CARDIOVERSION N/A 08/22/2015   Procedure: TRANSESOPHAGEAL ECHOCARDIOGRAM (TEE);  Surgeon: Vesta MixerPhilip J Nahser, MD;  [facility];  Service: Cardiovascular;  Laterality: N/A;     Allergies Allergies  Allergen Reactions   Lisinopril Swelling    Facial swelling    Home Medications    Prior to Admission medications   Medication Sig Start Date End Date Taking? Authorizing Provider  amLODipine (NORVASC) 10 MG tablet Take 1 tablet (10 mg total) by mouth daily. 10/18/18  Yes Etta GrandchildJones, [name], MD  Azilsartan-Chlorthalidone (EDARBYCLOR) 40-25 MG TABS Take 1 tablet by mouth daily. 03/06/19  Yes Etta GrandchildJones, [name], MD  clopidogrel (PLAVIX) 75 MG tablet TAKE 1 TABLET BY MOUTH EVERY DAY WITH BREAKFEST Patient taking differently: Take 75 mg by mouth daily with breakfast.  04/18/18  Yes Lyn RecordsSmith, Henry W, MD  Evolocumab (REPATHA SURECLICK) 140 MG/ML SOAJ Inject 140 mg into the skin every 14 (fourteen) days.   Yes [provider]  hydrALAZINE (APRESOLINE) 100 MG tablet Take 1 tablet (100 mg total) by mouth 3 (three) times daily. 10/18/18  Yes Etta GrandchildJones, [name], MD  nitroGLYCERIN (NITROSTAT) 0.4 MG SL tablet Place 1 tablet (0.4 mg total) under the tongue every 5 (five) minutes as needed for chest pain. 04/11/18 05/31/19 Yes Lyn RecordsSmith, Henry W, MD  rosuvastatin (CRESTOR) 40 MG tablet Take 1 tablet (40 mg total) by mouth daily. 10/18/18  Yes Etta GrandchildJones, [name], MD  Tetrahydrozoline HCl (VISINE OP) Place 1 drop into both eyes daily as needed (dry eyes).   Yes [provider]  thiamine ([facility] B-1) 100 MG tablet Take 1 tablet (100 mg total) by  mouth every other day. 03/12/19  Yes Etta GrandchildJones, [name], MD    Family History    Family History  Problem Relation Age of Onset   Hypertension Mother    Heart failure Mother    Hypertension Father    Colon cancer Father 466   Colon cancer Paternal Uncle    He indicated that his mother is alive. He indicated that his father is alive. He indicated that his maternal grandmother is deceased. He indicated that his maternal grandfather is deceased. He indicated that his paternal grandmother is deceased. He indicated that his paternal grandfather is deceased. He indicated that the status of his paternal uncle is unknown.   Social History    Social History   Socioeconomic History   Marital status: Married  Spouse name: Not on file   Number of children: Not on file   Years of education: Not on file   Highest education level: Not on file  Occupational History   Not on file  Social Needs   Financial resource strain: Not on file   Food insecurity    Worry: Not on file    Inability: Not on file   Transportation needs    Medical: Not on file    Non-medical: Not on file  Tobacco Use   Smoking status: Current Some Day Smoker    Packs/day: 0.00    Types: Cigarettes   Smokeless tobacco: Never Used  Substance and Sexual Activity   Alcohol use: No    Alcohol/week: 0.0 standard drinks   Drug use: No   Sexual activity: Not on file  Lifestyle   Physical activity    Days per week: Not on file    Minutes per session: Not on file   Stress: Not on file  Relationships   Social connections    Talks on phone: Not on file    Gets together: Not on file    Attends religious service: Not on file    Active member of club or organization: Not on file    Attends meetings of clubs or organizations: Not on file    Relationship status: Not on file   Intimate partner violence    Fear of current or ex partner: Not on file    Emotionally abused: Not on file    Physically abused:  Not on file    Forced sexual activity: Not on file  Other Topics Concern   Not on file  Social History Narrative   Not on file     Physical Exam    BP 111/73    Pulse 76    Temp 98.4 F (36.9 C) (Oral)    Resp 13    Ht 5\' 8"  (1.727 m)    Wt 84.8 kg    SpO2 100%    BMI 28.43 kg/m  General: Alert, NAD HEENT: Normal  Neck: No bruits or JVD. Lungs:  Resp regular and unlabored, CTA bilaterally. Heart: Regular rhythm, no s3, s4, or murmurs. Abdomen: Soft, non-tender, non-distended, BS +.  Extremities: Warm. No clubbing, cyanosis or edema. DP/PT/Radials 2+ and equal bilaterally. Psych: Normal affect. Neuro: Alert and oriented. No gross focal deficits. No abnormal movements.  Labs    Troponin (Point of Care Test) No results for input(s): TROPIPOC in the last 72 hours. No results for input(s): CKTOTAL, CKMB, TROPONINI in the last 72 hours. Lab Results  Component Value Date   WBC 8.7 04/28/2019   HGB 11.0 (L) 04/28/2019   HCT 33.6 (L) 04/28/2019   MCV 92.6 04/28/2019   PLT 323 04/28/2019    Recent Labs  Lab 04/28/19 2034  NA 138  K 3.2*  CL 104  CO2 23  BUN 19  CREATININE 1.57*  CALCIUM 9.1  GLUCOSE 137*   Lab Results  Component Value Date   CHOL 192 10/18/2018   HDL 51.20 10/18/2018   LDLCALC 130 (H) 10/18/2018   TRIG 51.0 10/18/2018   Lab Results  Component Value Date   DDIMER 0.81 (H) 04/28/2019     Radiology Studies    Ct Angio Chest Pe W And/or Wo Contrast  Result Date: 04/28/2019 CLINICAL DATA:  PE suspected, intermediate probability, positive D-dimer, gradual onset left chest pain EXAM: CT ANGIOGRAPHY CHEST WITH CONTRAST TECHNIQUE: Multidetector CT imaging of the chest  was performed using the standard protocol during bolus administration of intravenous contrast. Multiplanar CT image reconstructions and MIPs were obtained to evaluate the vascular anatomy. CONTRAST:  100mL OMNIPAQUE IOHEXOL 350 MG/ML SOLN COMPARISON:  Chest radiograph 04/28/2019 FINDINGS:  Cardiovascular: Satisfactory opacification the pulmonary arteries to the segmental level. No pulmonary artery filling defects are identified. Central pulmonary arteries are normal caliber. Normal heart size. No pericardial effusion. Coronary artery calcification is seen. Thoracic aorta is normal caliber with minimal atherosclerotic plaque. No acute aortic abnormality. The left vertebral artery arises directly from the aortic arch. Mediastinum/Nodes: No enlarged mediastinal, hilar, or axillary lymph nodes. Thyroid gland and thoracic inlet are unremarkable. Posterior bowing of the trachea compatible with imaging during exhalation. High attenuation material within the thoracic esophagus and proximal stomach. Lungs/Pleura: Atelectatic changes posteriorly. No consolidation, features of edema, pneumothorax, or effusion. No suspicious pulmonary nodules or masses. Upper Abdomen: Stomach distended with ingested material including more high attenuation material. No acute abnormalities present in the visualized portions of the upper abdomen. Atherosclerotic calcifications of the imaged abdominal aorta as well. Musculoskeletal: Multilevel degenerative changes are present in the imaged portions of the spine. No acute osseous abnormality or suspicious osseous lesion. Review of the MIP images confirms the above findings. IMPRESSION: 1. No evidence for pulmonary embolus. 2. No acute intrathoracic process. 3. High attenuation material within the thoracic esophagus and proximal stomach, may represent ingested material with some trace reflux. 4. Aortic Atherosclerosis (ICD10-I70.0). Electronically Signed   By: Kreg ShropshirePrice  DeHay M.D.   On: 04/28/2019 23:59   Dg Chest Port 1 View  Result Date: 04/28/2019 CLINICAL DATA:  Chest pain EXAM: PORTABLE CHEST 1 VIEW COMPARISON:  07/21/2016 FINDINGS: Heart and mediastinal contours are within normal limits. No focal opacities or effusions. No acute bony abnormality. IMPRESSION: No active disease.  Electronically Signed   By: Charlett NoseKevin  Dover M.D.   On: 04/28/2019 21:38    ECG & Cardiac Imaging    Last Cath - 02/24/2016 1. Critical stenosis of a large ramus intermedius branch treated successfully with PCI using a long drug-eluting stent 2. Mild diffuse proximal and mid LAD stenosis with severe apical stenosis of the LAD and total occlusion of the small first diagonal subbranch supplied by left to left collaterals 3. Large, dominant RCA with minimal irregularity and nonobstructive stenosis of a large posterolateral branch 4. Moderate stenosis of the mid left circumflex and severe OM subbranch stenosis Recommendations: The patient was loaded with Effient 60 mg on the Cath Lab table. I did not realize his history of stroke at the time. With this history, he cannot take effient as a long-term medication. Will transition him to plavix tomorrow morning. Brilinta should be avoided because of bradyarrhythmias.   Last TTE - 12/12/2018  1. The left ventricle has hyperdynamic systolic function, with an ejection fraction of >65%. The cavity size was normal. There is severe hypertrophy of the apical and mid segments. Left ventricular diastolic parameters were normal.  2. The right ventricle has normal systolic function. The cavity was normal. There is no increase in right ventricular wall thickness.  ECG - NSR with LVH by modified cornell criteria, non-specific T wave changes  Assessment & Plan    55M w/ CAD s/p PCI, apical variant HCM, AVB documented during sleep, prior stroke, HTN, HLD, who presents with concerning CP and mild troponin elevation, reportedly had transient ST changes noted by EMS. He is now chest pain free, but given his story as noted in HPI, as well as high  burden of CAD on last LHC 2017, have started heparin drip and plan for likely ischemic evaluation.   Of note, blood pressure a little soft and patient reports light headedness with standing recently. He's had poor appetite 2/2  stressful job and lost a little weight (he thinks about 10 lbs). I wonder if his BP has been running lower due to less PO intake, may need to pull back on HTN regimen some.   Unstable angina - continue aspirin, high intensity statin - heparin infusion - asa loaded, cont 81mg  daily - trend troponin to peak, serial ECG's, monitor closely on telemetry - NPO except meds, possible LHC  Essential HTN but orthostatic symptoms and soft pressure in ED - holding hydralazine and amlodipine - obtain orthostatic BP in AM (order placed) - plan to resume antihypertensives, but potentially at lower dose - looks like we have historically avoided beta-blockers due to documented AV dysfunction with sleep  Mild creatinine elevation from baseline & low potassium - 500cc NS and K given in AM - f/u I/O's, daily weights, BMP   PPX: DVT: on heparin infusion Diet: NPO except meds GI: not indicated Code status: Full code   Signed, , MD 04/29/2019, 1:58 AM  Attending addendum:  History and all data above reviewed.  Patient examined.  I agree with the findings as above.All available labs, radiology testing, previous records reviewed. Agree with documented assessment and plan. Mr. Mall is a 82M with CAD s/p RI PCI in 2017, hypertension, hyperlipidemia, and apical HCM admitted with NSTEMI.  He developed chest pain while driving that was similar to when he required PCI in the past.  HS-troponin mildly elevated and rising.  EKG consistent with HCM and unchanged from baseline.  Renal function is worse than baseline.  He reports poor oral intake for the last couple months due to stress at work.  BP low and home antihypertensives are on hold.  We will hydrate today and plan for [facility] tomorrow pending improvement in renal function.  Echo pending.   Itzamara Casas C. [facility], MD, [facility]  04/29/2019 9:36 AM

## 2019-04-30 ENCOUNTER — Encounter (HOSPITAL_COMMUNITY)
Admission: EM | Disposition: A | Discharge status: Home / Self Care | Payer: Self-pay | Source: Home / Self Care | Attending: Cardiology

## 2019-04-30 ENCOUNTER — Encounter (HOSPITAL_COMMUNITY): Payer: Self-pay | Admitting: Internal Medicine

## 2019-04-30 DIAGNOSIS — I25110 Atherosclerotic heart disease of native coronary artery with unstable angina pectoris: Secondary | ICD-10-CM

## 2019-04-30 HISTORY — PX: LEFT HEART CATH AND CORONARY ANGIOGRAPHY: CATH118249

## 2019-04-30 HISTORY — PX: CORONARY BALLOON ANGIOPLASTY: CATH118233

## 2019-04-30 LAB — BASIC METABOLIC PANEL
Anion gap: 8 (ref 5–15)
BUN: 21 mg/dL — ABNORMAL HIGH (ref 6–20)
CO2: 23 mmol/L (ref 22–32)
Calcium: 8.7 mg/dL — ABNORMAL LOW (ref 8.9–10.3)
Chloride: 107 mmol/L (ref 98–111)
Creatinine, Ser: 1.25 mg/dL — ABNORMAL HIGH (ref 0.61–1.24)
GFR calc Af Amer: >60 mL/min (ref >60)
GFR calc non Af Amer: >60 mL/min (ref >60)
Glucose, Bld: 83 mg/dL (ref 70–99)
Potassium: 4.1 mmol/L (ref 3.5–5.1)
Sodium: 138 mmol/L (ref 135–145)

## 2019-04-30 LAB — CBC
HCT: 30.4 % — ABNORMAL LOW (ref 39.0–52.0)
Hemoglobin: 9.8 g/dL — ABNORMAL LOW (ref 13.0–17.0)
MCH: 29.5 pg (ref 26.0–34.0)
MCHC: 32.2 g/dL (ref 30.0–36.0)
MCV: 91.6 fL (ref 80.0–100.0)
Platelets: 268 10*3/uL (ref 150–400)
RBC: 3.32 MIL/uL — ABNORMAL LOW (ref 4.22–5.81)
RDW: 12 % (ref 11.5–15.5)
WBC: 6 10*3/uL (ref 4.0–10.5)
nRBC: 0 % (ref 0.0–0.2)

## 2019-04-30 LAB — POCT ACTIVATED CLOTTING TIME
Activated Clotting Time: 246 seconds
Activated Clotting Time: 268 seconds

## 2019-04-30 LAB — HEPARIN LEVEL (UNFRACTIONATED): Heparin Unfractionated: 0.38 IU/mL (ref 0.30–0.70)

## 2019-04-30 SURGERY — LEFT HEART CATH AND CORONARY ANGIOGRAPHY
Anesthesia: LOCAL

## 2019-04-30 MED ORDER — MIDAZOLAM HCL 2 MG/2ML IJ SOLN
INTRAMUSCULAR | Status: DC | PRN
  Administered 2019-04-30: 2 mg via INTRAVENOUS

## 2019-04-30 MED ORDER — SODIUM CHLORIDE 0.9% FLUSH: 3.0000 mL | Freq: Two times a day (BID) | INTRAVENOUS | Status: DC

## 2019-04-30 MED ORDER — VERAPAMIL HCL 2.5 MG/ML IV SOLN
INTRAVENOUS | Status: DC | PRN
  Administered 2019-04-30: 10 mL via INTRA_ARTERIAL

## 2019-04-30 MED ORDER — FENTANYL CITRATE (PF) 100 MCG/2ML IJ SOLN
INTRAMUSCULAR | Status: AC
  Filled 2019-04-30: qty 2

## 2019-04-30 MED ORDER — CLOPIDOGREL BISULFATE 75 MG PO TABS: 75.0000 mg | ORAL_TABLET | Freq: Every day | ORAL | Status: DC

## 2019-04-30 MED ORDER — HEPARIN (PORCINE) IN NACL 1000-0.9 UT/500ML-% IV SOLN
INTRAVENOUS | Status: AC
  Filled 2019-04-30: qty 500

## 2019-04-30 MED ORDER — ASPIRIN 81 MG PO CHEW: 81.0000 mg | CHEWABLE_TABLET | Freq: Every day | ORAL | Status: DC

## 2019-04-30 MED ORDER — CLOPIDOGREL BISULFATE 300 MG PO TABS
ORAL_TABLET | ORAL | Status: DC | PRN
  Administered 2019-04-30: 600 mg via ORAL

## 2019-04-30 MED ORDER — VERAPAMIL HCL 2.5 MG/ML IV SOLN
INTRAVENOUS | Status: AC
  Filled 2019-04-30: qty 2

## 2019-04-30 MED ORDER — HEPARIN SODIUM (PORCINE) 1000 UNIT/ML IJ SOLN
INTRAMUSCULAR | Status: DC | PRN
  Administered 2019-04-30: 6000 [IU] via INTRAVENOUS
  Administered 2019-04-30: 3000 [IU] via INTRAVENOUS

## 2019-04-30 MED ORDER — NITROGLYCERIN 0.4 MG SL SUBL: 0.4000 mg | SUBLINGUAL_TABLET | SUBLINGUAL | 3 refills | Status: DC | PRN

## 2019-04-30 MED ORDER — IOHEXOL 350 MG/ML SOLN
INTRAVENOUS | Status: DC | PRN
  Administered 2019-04-30: 40 mL via INTRA_ARTERIAL

## 2019-04-30 MED ORDER — MIDAZOLAM HCL 2 MG/2ML IJ SOLN
INTRAMUSCULAR | Status: AC
  Filled 2019-04-30: qty 2

## 2019-04-30 MED ORDER — SODIUM CHLORIDE 0.9% FLUSH: 3.0000 mL | INTRAVENOUS | Status: DC | PRN

## 2019-04-30 MED ORDER — FENTANYL CITRATE (PF) 100 MCG/2ML IJ SOLN
INTRAMUSCULAR | Status: DC | PRN
  Administered 2019-04-30: 25 ug via INTRAVENOUS

## 2019-04-30 MED ORDER — SODIUM CHLORIDE 0.9 % IV SOLN: 250.0000 mL | INTRAVENOUS | Status: DC | PRN

## 2019-04-30 MED ORDER — MIDAZOLAM HCL 2 MG/2ML IJ SOLN
INTRAMUSCULAR | Status: DC | PRN
  Administered 2019-04-30: 1 mg via INTRAVENOUS

## 2019-04-30 MED ORDER — CLOPIDOGREL BISULFATE 300 MG PO TABS
ORAL_TABLET | ORAL | Status: AC
  Filled 2019-04-30: qty 2

## 2019-04-30 MED ORDER — SODIUM CHLORIDE 0.9 % WEIGHT BASED INFUSION
1.0000 mL/kg/h | INTRAVENOUS | Status: AC
  Administered 2019-04-30: 1 mL/kg/h via INTRAVENOUS

## 2019-04-30 MED ORDER — HEPARIN SODIUM (PORCINE) 1000 UNIT/ML IJ SOLN
INTRAMUSCULAR | Status: DC | PRN
  Administered 2019-04-30: 4000 [IU] via INTRAVENOUS

## 2019-04-30 MED ORDER — NITROGLYCERIN 1 MG/10 ML FOR IR/CATH LAB
INTRA_ARTERIAL | Status: AC
  Filled 2019-04-30: qty 10

## 2019-04-30 MED ORDER — LIDOCAINE HCL (PF) 1 % IJ SOLN
INTRAMUSCULAR | Status: AC
  Filled 2019-04-30: qty 30

## 2019-04-30 MED ORDER — HEPARIN (PORCINE) IN NACL 1000-0.9 UT/500ML-% IV SOLN
INTRAVENOUS | Status: DC | PRN
  Administered 2019-04-30 (×2): 500 mL

## 2019-04-30 MED ORDER — HEPARIN SODIUM (PORCINE) 1000 UNIT/ML IJ SOLN
INTRAMUSCULAR | Status: AC
  Filled 2019-04-30: qty 1

## 2019-04-30 MED ORDER — NITROGLYCERIN 1 MG/10 ML FOR IR/CATH LAB
INTRA_ARTERIAL | Status: DC | PRN
  Administered 2019-04-30: 200 ug via INTRACORONARY

## 2019-04-30 MED ORDER — ENSURE ENLIVE PO LIQD
237.0000 mL | Freq: Two times a day (BID) | ORAL | Status: DC
  Administered 2019-04-30: 237 mL via ORAL

## 2019-04-30 MED ORDER — FENTANYL CITRATE (PF) 100 MCG/2ML IJ SOLN
INTRAMUSCULAR | Status: DC | PRN
  Administered 2019-04-30 (×2): 25 ug via INTRAVENOUS

## 2019-04-30 MED ORDER — LIDOCAINE HCL (PF) 1 % IJ SOLN
INTRAMUSCULAR | Status: DC | PRN
  Administered 2019-04-30: 2 mL

## 2019-04-30 MED ORDER — IOHEXOL 350 MG/ML SOLN
INTRAVENOUS | Status: DC | PRN
  Administered 2019-04-30: 90 mL

## 2019-04-30 SURGICAL SUPPLY — 15 items
BALLN WOLVERINE 3.00X15 (BALLOONS) ×2
BALLOON WOLVERINE 3.00X15 (BALLOONS) ×1 IMPLANT
CATH 5FR JL3.5 JR4 ANG PIG MP (CATHETERS) ×2 IMPLANT
CATH VISTA GUIDE 6FR JL4 (CATHETERS) ×2 IMPLANT
DEVICE RAD COMP TR BAND LRG (VASCULAR PRODUCTS) ×2 IMPLANT
GLIDESHEATH SLEND SS 6F .021 (SHEATH) ×2 IMPLANT
GUIDEWIRE INQWIRE 1.5J.035X260 (WIRE) ×1 IMPLANT
INQWIRE 1.5J .035X260CM (WIRE) ×2
KIT ENCORE 26 ADVANTAGE (KITS) ×2 IMPLANT
KIT HEART LEFT (KITS) ×2 IMPLANT
PACK CARDIAC CATHETERIZATION (CUSTOM PROCEDURE TRAY) ×2 IMPLANT
SYR MEDRAD MARK 7 150ML (SYRINGE) ×2 IMPLANT
TRANSDUCER W/STOPCOCK (MISCELLANEOUS) ×2 IMPLANT
TUBING CIL FLEX 10 FLL-RA (TUBING) ×2 IMPLANT
WIRE ASAHI PROWATER 180CM (WIRE) ×2 IMPLANT

## 2019-04-30 NOTE — Plan of Care (Signed)
  Problem: Education: Goal: Knowledge of General Education information will improve Description: Including pain rating scale, medication(s)/side effects and non-pharmacologic comfort measures Outcome: Progressing   Problem: Health Behavior/Discharge Planning: Goal: Ability to manage health-related needs will improve Outcome: Progressing   Problem: Clinical Measurements: Goal: Ability to maintain clinical measurements within normal limits will improve Outcome: Progressing   Problem: Nutrition: Goal: Adequate nutrition will be maintained Outcome: Progressing   Problem: Coping: Goal: Level of anxiety will decrease Outcome: Progressing   Problem: Pain Managment: Goal: General experience of comfort will improve Outcome: Progressing   Problem: Safety: Goal: Ability to remain free from injury will improve Outcome: Progressing   Elesa Hacker, RN

## 2019-04-30 NOTE — Progress Notes (Addendum)
Discussed PTCA, restrictions, Plavix/ASA, smoking cessation, diet, exercise, stress relief, NTG and CRPII with pt and wife. Receptive. He wants to quit smoking but is not ready right now as his job is very stressful. Discussed methods and benefits of quitting smoking. Will refer to [place]. Gave videos to watch. He needs new NTG.   Pt is interested in participating in Virtual Cardiac and Pulmonary Rehab. Pt advised that Virtual Cardiac and Pulmonary Rehab is provided at no cost to the patient. Checklist:  1. Pt has smart device  ie smartphone and/or ipad for downloading an app  Yes 2. Reliable internet/wifi service    Yes 3. Understands how to use their smartphone and navigate within an app.  Yes Pt verbalized understanding and is in agreement.  [place], ACSM 1:37 PM 04/30/2019

## 2019-04-30 NOTE — Interval H&P Note (Signed)
History and Physical Interval Note:  04/30/2019 9:39 AM  [name]  has presented today for surgery, with the diagnosis of unstable angina.  The various methods of treatment have been discussed with the patient and family. After consideration of risks, benefits and other options for treatment, the patient has consented to  Procedure(s): LEFT HEART CATH AND CORONARY ANGIOGRAPHY (N/A) and possible coronary angioplasty  a surgical intervention.  The patient's history has been reviewed, patient examined, no change in status, stable for surgery.  I have reviewed the patient's chart and labs.  Questions were answered to the patient's satisfaction.     [name]

## 2019-04-30 NOTE — Progress Notes (Signed)
TR BAND REMOVAL  LOCATION:  right radial  DEFLATED PER PROTOCOL:  Yes.    TIME BAND OFF / DRESSING APPLIED:   1630   SITE UPON ARRIVAL:   Level 0  SITE AFTER BAND REMOVAL:  Level 0  CIRCULATION SENSATION AND MOVEMENT:  Within Normal Limits  No.  COMMENTS:  Capillary refill greater than 3 seconds on right and left hands.

## 2019-04-30 NOTE — Progress Notes (Signed)
Initial Nutrition Assessment  INTERVENTION:   -Ensure Enlive po BID, each supplement provides 350 kcal and 20 grams of protein  NUTRITION DIAGNOSIS:   Inadequate oral intake related to poor appetite as evidenced by per patient/family report.  GOAL:   Patient will meet greater than or equal to 90% of their needs  MONITOR:   PO intake, Supplement acceptance, Labs, Weight trends, I & O's  REASON FOR ASSESSMENT:   Malnutrition Screening Tool    ASSESSMENT:   58 year old man with CAD s/p prior PCI to large RI 2017, HTN, HLD (on rosuvastatin and repatha), "documented AVB during sleep," "apical variant hypertrophic cardiomyopathy," prior CVA, who presented to the ED with concerning story of chest pain  **RD working remotely**  Pt currently in cath lab for left heart cath and coronary angiography. Currently NPO.  Per chart review, pt was having chest pain with nausea PTA. Pt has been stressed from a new job which has affected his appetite. Reports 10 lbs of weight loss. Once diet is advanced, recommend Ensure supplements.  Per weight records, pt has lost 12 lbs since 10/6 (6% wt loss x 2 months, significant for time frame).   I/Os: +1.2L since admit UOP: 750 ml x 24 hrs  Labs reviewed. Medications reviewed.  NUTRITION - FOCUSED PHYSICAL EXAM:  Working remotely.  Diet Order:   Diet Order            Diet NPO time specified Except for: Sips with Meds  Diet effective midnight              EDUCATION NEEDS:   No education needs have been identified at this time  Skin:  Skin Assessment: Reviewed RN Assessment  Last BM:  PTA  Height:   Ht Readings from Last 1 Encounters:  04/29/19 5\' 8"  (1.727 m)    Weight:   Wt Readings from Last 1 Encounters:  04/30/19 76.3 kg    Ideal Body Weight:  70 kg  BMI:  Body mass index is 25.59 kg/m.  Estimated Nutritional Needs:   Kcal:  1900-2100  Protein:  85-95g  Fluid:  1.9L/day  Clayton Bibles, MS, RD,  LDN Inpatient Clinical Dietitian Pager: [phone] After Hours Pager: [phone]

## 2019-04-30 NOTE — Progress Notes (Signed)
Progress Note  Patient Name: [name] Date of Encounter: 04/30/2019  Primary Cardiologist: [name], MD   Subjective   No complaints this morning.   Inpatient Medications    Scheduled Meds:  aspirin EC  81 mg Oral Daily   rosuvastatin  40 mg Oral Daily   sodium chloride flush  3 mL Intravenous Q12H   Continuous Infusions:  sodium chloride     sodium chloride 1 mL/kg/hr (04/30/19 0501)   heparin 1,200 Units/hr (04/29/19 1817)   PRN Meds: sodium chloride, acetaminophen, nitroGLYCERIN, ondansetron (ZOFRAN) IV, sodium chloride flush   Vital Signs    Vitals:   04/29/19 0726 04/29/19 1700 04/29/19 2020 04/30/19 0609  BP:  (!) 100/56 104/65 124/73  Pulse:  66 61 62  Resp: 20     Temp:  98.7 F (37.1 C) 98.4 F (36.9 C) 98.3 F (36.8 C)  TempSrc:  Oral Oral Oral  SpO2:  100% 100% 100%  Weight:    76.3 kg  Height:        Intake/Output Summary (Last 24 hours) at 04/30/2019 0840 Last data filed at 04/30/2019 0300 Gross per 24 hour  Intake 1966.08 ml  Output --  Net 1966.08 ml   Last 3 Weights 04/30/2019 04/29/2019 04/28/2019  Weight (lbs) 168 lb 4.8 oz 169 lb 11.2 oz 187 lb  Weight (kg) 76.34 kg 76.975 kg 84.823 kg      Telemetry    SB - Personally Reviewed  ECG    SR with LVH - Personally Reviewed  Physical Exam  Pleasant AAM, sitting up in bed.  GEN: No acute distress.   Neck: No JVD Cardiac: RRR, no murmurs, rubs, or gallops.  Respiratory: Clear to auscultation bilaterally. GI: Soft, nontender, non-distended  MS: No edema; No deformity. Neuro:  Nonfocal  Psych: Normal affect   Labs    High Sensitivity Troponin:   Recent Labs  Lab 04/28/19 2034 04/28/19 2248 04/29/19 0623  TROPONINIHS 8 28* 46*      Chemistry Recent Labs  Lab 04/28/19 2034 04/29/19 0623  NA 138 140  K 3.2* 4.0  CL 104 107  CO2 23 24  GLUCOSE 137* 84  BUN 19 21*  CREATININE 1.57* 1.70*  CALCIUM 9.1 9.0  GFRNONAA 48* 44*  GFRAA 56* 51*   ANIONGAP 11 9     Hematology Recent Labs  Lab 04/28/19 2034 04/29/19 0623 04/30/19 0416  WBC 8.7 6.7 6.0  RBC 3.63* 3.52* 3.32*  HGB 11.0* 10.5* 9.8*  HCT 33.6* 31.9* 30.4*  MCV 92.6 90.6 91.6  MCH 30.3 29.8 29.5  MCHC 32.7 32.9 32.2  RDW 11.9 12.0 12.0  PLT 323 291 268    BNP Recent Labs  Lab 04/29/19 0623  BNP 12.0     DDimer  Recent Labs  Lab 04/28/19 2204  DDIMER 0.81*     Radiology    Ct Angio Chest Pe W And/or Wo Contrast  Result Date: 04/28/2019 CLINICAL DATA:  PE suspected, intermediate probability, positive D-dimer, gradual onset left chest pain EXAM: CT ANGIOGRAPHY CHEST WITH CONTRAST TECHNIQUE: Multidetector CT imaging of the chest was performed using the standard protocol during bolus administration of intravenous contrast. Multiplanar CT image reconstructions and MIPs were obtained to evaluate the vascular anatomy. CONTRAST:  OMNIPAQUE IOHEXOL 350 MG/ML SOLN COMPARISON:  Chest radiograph 04/28/2019 FINDINGS: Cardiovascular: Satisfactory opacification the pulmonary arteries to the segmental level. No pulmonary artery filling defects are identified. Central pulmonary arteries are normal caliber. Normal heart size.  No pericardial effusion. Coronary artery calcification is seen. Thoracic aorta is normal caliber with minimal atherosclerotic plaque. No acute aortic abnormality. The left vertebral artery arises directly from the aortic arch. Mediastinum/Nodes: No enlarged mediastinal, hilar, or axillary lymph nodes. Thyroid gland and thoracic inlet are unremarkable. Posterior bowing of the trachea compatible with imaging during exhalation. High attenuation material within the thoracic esophagus and proximal stomach. Lungs/Pleura: Atelectatic changes posteriorly. No consolidation, features of edema, pneumothorax, or effusion. No suspicious pulmonary nodules or masses. Upper Abdomen: Stomach distended with ingested material including more high attenuation material.  No acute abnormalities present in the visualized portions of the upper abdomen. Atherosclerotic calcifications of the imaged abdominal aorta as well. Musculoskeletal: Multilevel degenerative changes are present in the imaged portions of the spine. No acute osseous abnormality or suspicious osseous lesion. Review of the MIP images confirms the above findings. IMPRESSION: 1. No evidence for pulmonary embolus. 2. No acute intrathoracic process. 3. High attenuation material within the thoracic esophagus and proximal stomach, may represent ingested material with some trace reflux. 4. Aortic Atherosclerosis (ICD10-I70.0). Electronically Signed   By: Lovena Le M.D.   On: 04/28/2019 23:59   Dg Chest Port 1 View  Result Date: 04/28/2019 CLINICAL DATA:  Chest pain EXAM: PORTABLE CHEST 1 VIEW COMPARISON:  07/21/2016 FINDINGS: Heart and mediastinal contours are within normal limits. No focal opacities or effusions. No acute bony abnormality. IMPRESSION: No active disease. Electronically Signed   By: Rolm Baptise M.D.   On: 04/28/2019 21:38    Cardiac Studies   TTE: 04/29/19  IMPRESSIONS    1. Left ventricular ejection fraction, by visual estimation, is 60 to 65%. The left ventricle has normal function. Left ventricular septal wall thickness was mildly increased. Mildly increased left ventricular posterior wall thickness. There is no left  ventricular hypertrophy.  2. Global right ventricle has normal systolic function.The right ventricular size is normal. No increase in right ventricular wall thickness.  3. Left atrial size was mildly dilated.  4. Right atrial size was normal.  5. The mitral valve is normal in structure. Trace mitral valve regurgitation. No evidence of mitral stenosis.  6. The tricuspid valve is normal in structure. Tricuspid valve regurgitation is not demonstrated.  7. The aortic valve was not well visualized. Aortic valve regurgitation is not visualized. No evidence of aortic valve  sclerosis or stenosis.  8. The pulmonic valve was normal in structure. Pulmonic valve regurgitation is not visualized.  9. The inferior vena cava is normal in size with greater than 50% respiratory variability, suggesting right atrial pressure of 3 mmHg.  Patient Profile     58 y.o. male w/ CAD s/p PCI, apical variant HCM, AVB documented during sleep, prior stroke, HTN, HLD, who presented with concerning CP and mild troponin elevation, reportedly had transient ST changes noted by EMS.  Assessment & Plan    1. Unstable Angina: presented with episode of chest pain while driving. Very similar to what he experienced back in 2017 with PCI. Did have residual disease in OM and diag vessels, along with 50% in Lcx. Had mild episode this morning. Planned for cath today. Echo noted above with normal EF. -- remains on IV heparin. ASA, statin  2. HTN: has been on amlodipine as an outpatient. Blood pressures were soft overnight but improved this morning. Meds on hold.  3. HL: on statin and Repatha. LDL 32  4. AKI: Cr 1.5>>1.7. BMET pending this morning.  For questions or updates, please contact [facility] Please  consult [email or web address] for contact info under     Signed, Laverda Page, NP  04/30/2019, 8:40 AM

## 2019-04-30 NOTE — Discharge Summary (Signed)
Discharge Summary    Patient ID: [name],  MRN: [id number], DOB/AGE: January 20, 1961 58 y.o.  Admit date: 04/28/2019 Discharge date: 04/30/2019  Primary Care Provider: Etta GrandchildJones, [name] Primary Cardiologist: [name], MD  Discharge Diagnoses    Principal Problem:   NSTEMI (non-ST elevated myocardial infarction) [facility](HCC) Active Problems:   Hyperlipidemia with target LDL less than 70   Essential hypertension   GERD (gastroesophageal reflux disease)   Coronary artery disease involving native coronary artery of native heart with angina pectoris (HCC)   Chronic renal insufficiency, stage II (mild)   Unstable angina (HCC)   Allergies Allergies  Allergen Reactions   Lisinopril Swelling    Facial swelling    Diagnostic Studies/Procedures    Cath: 04/30/19   Prox LAD to Mid LAD lesion is 30% stenosed.  Dist LAD lesion is 95% stenosed.  Ost 2nd Diag to 2nd Diag lesion is 80% stenosed.  1st Diag lesion is 100% stenosed.  Balloon angioplasty was performed.  Lat 2nd Mrg lesion is 95% stenosed.  1st RPL lesion is 50% stenosed.  Ramus lesion is 95% stenosed.  Ost LM lesion is 20% stenosed.  Prox RCA lesion is 30% stenosed.  Dist RCA lesion is 20% stenosed.  Mid Cx lesion is 70% stenosed.  RPAV lesion is 50% stenosed.    Findings:  1. 3v CAD 2. There is severe 95% restenosis in the 2nd half of the long Ramus stent 3. Mild progression of bifurcation lesion in mid LCX. Now ~ 70% 4. CAD otherwise stable 5. EF 60-65% 6. LV = 110/1   Assessment:  Plan PCI ISR of Ramus with Dr. [place].   Arvilla Meresaniel Bensimhon, MD  10:57 AM   Dist LAD lesion is 90% stenosed.  1st Diag lesion is 100% stenosed.  Ramus lesion is 90% stenosed.  Post intervention, there is a 25% residual stenosis.   1. Successful cutting balloon angioplasty of the ramus intermediate artery for in stent restenosis.  Plan; DAPT for one year for ACS indication. If patient has   restenosis again could consider repeat stenting.   Diagnostic Dominance: Right  Intervention     TTE: 04/29/19  IMPRESSIONS    1. Left ventricular ejection fraction, by visual estimation, is 60 to 65%. The left ventricle has normal function. Left ventricular septal wall thickness was mildly increased. Mildly increased left ventricular posterior wall thickness. There is no left  ventricular hypertrophy.  2. Global right ventricle has normal systolic function.The right ventricular size is normal. No increase in right ventricular wall thickness.  3. Left atrial size was mildly dilated.  4. Right atrial size was normal.  5. The mitral valve is normal in structure. Trace mitral valve regurgitation. No evidence of mitral stenosis.  6. The tricuspid valve is normal in structure. Tricuspid valve regurgitation is not demonstrated.  7. The aortic valve was not well visualized. Aortic valve regurgitation is not visualized. No evidence of aortic valve sclerosis or stenosis.  8. The pulmonic valve was normal in structure. Pulmonic valve regurgitation is not visualized.  9. The inferior vena cava is normal in size with greater than 50% respiratory variability, suggesting right atrial pressure of 3 mmHg. _____________   History of Present Illness     58 year old man with CAD s/p prior PCI to large RI 2017, HTN, HLD (on rosuvastatin and repatha), "documented AVB during sleep," "apical variant hypertrophic cardiomyopathy," prior CVA, who presented to the ED with concerning story of chest pain, now admitted to  cardiology team.   Patient last saw Dr. Tamala Julian 02/26/2019, discontinued aspirin and continued plavix 75 at that visit, LDL not at target so follows in lipid clinic. Placed on [place].  The patient presented to ED with several days of worsening CP on 04/29/19, was particularly bad the night prior while driving home from work, EMS called and gave 324 ASA and nitro with minimal relief. Per EMS  he reportedly also had some ST depressions on his strip, although unable to Iocate cannot find strips in his chart. In ED initial troponin 8 but repeat 28, D-dimer positive, CTA negative for PE, no evidence for dissection, some evidence for reflux noted. Heparin drip was started, chest pain resolved.   Patient reported several days of worsening exertional CP occasionally occurring at rest, last night happened at rest while driving home and was very bad (10/10 for a while) and did not relent until in the ED. He said pain was deep, radiated some to left arm, associated with dyspnea and nausea, and reminded him of his prior heart attack. He denied any syncope, palpitations, edema, orthopnea, or PND. He did report fatigue lately, has stress at his new job and lost some of his appetite. He also noted he had been getting light headed when he stands up too quickly recently.  Hospital Course     He was started on IV heparin and admitted with plans for cardiac cath. HsT peaked at 45. Underwent cardiac cath noted above with ISR in the ramus branch with prior stenting. Successful cutting balloon angioplasty. Plan to continue on DAPT with ASA/plavix for at least one year. No complications note post cath. He was continued on his home medications and instructed to follow his blood pressures closely as an outpatient. Seen by cardiac rehab prior to discharge.    Gardner Servantes was seen by Dr. [place] and determined stable for discharge home. Follow up in the office has been arranged. Medications are listed below.   _____________  Discharge Vitals Blood pressure 119/78, pulse (!) 57, temperature 98.6 F (37 C), temperature source Oral, resp. rate 14, height 5\' 8"  (1.727 m), weight 76.3 kg, SpO2 100 %.  Filed Weights   04/28/19 2041 04/29/19 0456 04/30/19 0609  Weight: 84.8 kg 77 kg 76.3 kg    Labs & Radiologic Studies    CBC Recent Labs    04/28/19 2034 04/29/19 0623 04/30/19 0416  WBC 8.7 6.7 6.0    NEUTROABS 5.9  --   --   HGB 11.0* 10.5* 9.8*  HCT 33.6* 31.9* 30.4*  MCV 92.6 90.6 91.6  PLT 323 291 106   Basic Metabolic Panel Recent Labs    04/29/19 0623 04/30/19 0819  NA 140 138  K 4.0 4.1  CL 107 107  CO2 24 23  GLUCOSE 84 83  BUN 21* 21*  CREATININE 1.70* 1.25*  CALCIUM 9.0 8.7*   Liver Function Tests No results for input(s): AST, ALT, ALKPHOS, BILITOT, PROT, ALBUMIN in the last 72 hours. No results for input(s): LIPASE, AMYLASE in the last 72 hours. Cardiac Enzymes No results for input(s): CKTOTAL, CKMB, CKMBINDEX, TROPONINI in the last 72 hours. BNP Invalid input(s): POCBNP D-Dimer Recent Labs    04/28/19 2204  DDIMER 0.81*   Hemoglobin A1C Recent Labs    04/29/19 0623  HGBA1C 5.3   Fasting Lipid Panel Recent Labs    04/29/19 0623  CHOL 76  HDL 37*  LDLCALC 32  TRIG 33  CHOLHDL 2.1   Thyroid Function Tests No  results for input(s): TSH, T4TOTAL, T3FREE, THYROIDAB in the last 72 hours.  Invalid input(s): FREET3 _____________  Ct Angio Chest Pe W And/or Wo Contrast  Result Date: 04/28/2019 CLINICAL DATA:  PE suspected, intermediate probability, positive D-dimer, gradual onset left chest pain EXAM: CT ANGIOGRAPHY CHEST WITH CONTRAST TECHNIQUE: Multidetector CT imaging of the chest was performed using the standard protocol during bolus administration of intravenous contrast. Multiplanar CT image reconstructions and MIPs were obtained to evaluate the vascular anatomy. CONTRAST:  OMNIPAQUE IOHEXOL 350 MG/ML SOLN COMPARISON:  Chest radiograph 04/28/2019 FINDINGS: Cardiovascular: Satisfactory opacification the pulmonary arteries to the segmental level. No pulmonary artery filling defects are identified. Central pulmonary arteries are normal caliber. Normal heart size. No pericardial effusion. Coronary artery calcification is seen. Thoracic aorta is normal caliber with minimal atherosclerotic plaque. No acute aortic abnormality. The left vertebral  artery arises directly from the aortic arch. Mediastinum/Nodes: No enlarged mediastinal, hilar, or axillary lymph nodes. Thyroid gland and thoracic inlet are unremarkable. Posterior bowing of the trachea compatible with imaging during exhalation. High attenuation material within the thoracic esophagus and proximal stomach. Lungs/Pleura: Atelectatic changes posteriorly. No consolidation, features of edema, pneumothorax, or effusion. No suspicious pulmonary nodules or masses. Upper Abdomen: Stomach distended with ingested material including more high attenuation material. No acute abnormalities present in the visualized portions of the upper abdomen. Atherosclerotic calcifications of the imaged abdominal aorta as well. Musculoskeletal: Multilevel degenerative changes are present in the imaged portions of the spine. No acute osseous abnormality or suspicious osseous lesion. Review of the MIP images confirms the above findings. IMPRESSION: 1. No evidence for pulmonary embolus. 2. No acute intrathoracic process. 3. High attenuation material within the thoracic esophagus and proximal stomach, may represent ingested material with some trace reflux. 4. Aortic Atherosclerosis (ICD10-I70.0). Electronically Signed   By: Kreg Shropshire M.D.   On: 04/28/2019 23:59   Dg Chest Port 1 View  Result Date: 04/28/2019 CLINICAL DATA:  Chest pain EXAM: PORTABLE CHEST 1 VIEW COMPARISON:  07/21/2016 FINDINGS: Heart and mediastinal contours are within normal limits. No focal opacities or effusions. No acute bony abnormality. IMPRESSION: No active disease. Electronically Signed   By: Charlett Nose M.D.   On: 04/28/2019 21:38   Disposition   Pt is being discharged home today in good condition.  Follow-up Plans & Appointments    Follow-up Information    Berton Bon, NP Follow up on 05/16/2019.   Specialty: Cardiology Why: at 11am for your follow up appt.  Contact information: [address] [place]  [address] [phone]          Discharge Instructions    Amb Referral to Cardiac Rehabilitation   Complete by: As directed    Diagnosis: PTCA   After initial evaluation and assessments completed: Virtual Based Care may be provided alone or in conjunction with Phase 2 Cardiac Rehab based on patient barriers.: Yes   Call MD for:  redness, tenderness, or signs of infection (pain, swelling, redness, odor or green/yellow discharge around incision site)   Complete by: As directed    Diet - low sodium heart healthy   Complete by: As directed    Discharge instructions   Complete by: As directed    Radial Site Care Refer to this sheet in the next few weeks. These instructions provide you with information on caring for yourself after your procedure. Your caregiver may also give you more specific instructions. Your treatment has been planned according to current medical practices, but  problems sometimes occur. Call your caregiver if you have any problems or questions after your procedure. HOME CARE INSTRUCTIONS You may shower the day after the procedure.Remove the bandage (dressing) and gently wash the site with plain soap and water.Gently pat the site dry.  Do not apply powder or lotion to the site.  Do not submerge the affected site in water for 3 to 5 days.  Inspect the site at least twice daily.  Do not flex or bend the affected arm for 24 hours.  No lifting over 5 pounds (2.3 kg) for 5 days after your procedure.  Do not drive home if you are discharged the same day of the procedure. Have someone else drive you.  You may drive 24 hours after the procedure unless otherwise instructed by your caregiver.  What to expect: Any bruising will usually fade within 1 to 2 weeks.  Blood that collects in the tissue (hematoma) may be painful to the touch. It should usually decrease in size and tenderness within 1 to 2 weeks.  SEEK IMMEDIATE MEDICAL CARE IF: You have unusual pain at the radial site.   You have redness, warmth, swelling, or pain at the radial site.  You have drainage (other than a small amount of blood on the dressing).  You have chills.  You have a fever or persistent symptoms for more than 72 hours.  You have a fever and your symptoms suddenly get worse.  Your arm becomes pale, cool, tingly, or numb.  You have heavy bleeding from the site. Hold pressure on the site.   PLEASE DO NOT MISS ANY DOSES OF YOUR PLAVIX!!!!! Also keep a log of you blood pressures and bring back to your follow up appt. Please call the office with any questions.   Patients taking blood thinners should generally stay away from medicines like ibuprofen, Advil, Motrin, naproxen, and Aleve due to risk of stomach bleeding. You may take Tylenol as directed or talk to your primary doctor about alternatives.   Increase activity slowly   Complete by: As directed        Discharge Medications     Medication List    TAKE these medications   amLODipine 10 MG tablet Commonly known as: NORVASC Take 1 tablet (10 mg total) by mouth daily.   aspirin 81 MG chewable tablet Chew 1 tablet (81 mg total) by mouth daily. Start taking on: May 01, 2019   clopidogrel 75 MG tablet Commonly known as: PLAVIX TAKE 1 TABLET BY MOUTH EVERY DAY WITH BREAKFEST What changed: See the new instructions.   Edarbyclor 40-25 MG Tabs Generic drug: Azilsartan-Chlorthalidone Take 1 tablet by mouth daily.   hydrALAZINE 100 MG tablet Commonly known as: APRESOLINE Take 1 tablet (100 mg total) by mouth 3 (three) times daily.   nitroGLYCERIN 0.4 MG SL tablet Commonly known as: NITROSTAT Place 1 tablet (0.4 mg total) under the tongue every 5 (five) minutes as needed for chest pain.   Repatha SureClick 140 MG/ML [place] Generic drug: Evolocumab Inject 140 mg into the skin every 14 (fourteen) days.   rosuvastatin 40 MG tablet Commonly known as: CRESTOR Take 1 tablet (40 mg total) by mouth daily.   thiamine 100 MG  tablet Commonly known as: [employer]S B-1 Take 1 tablet (100 mg total) by mouth every other day.   VISINE OP Place 1 drop into both eyes daily as needed (dry eyes).        Yes  AHA/ACC Clinical Performance & Quality Measures: 1. Aspirin prescribed? - Yes 2. ADP Receptor Inhibitor (Plavix/Clopidogrel, Brilinta/Ticagrelor or Effient/Prasugrel) prescribed (includes medically managed patients)? - Yes 3. Beta Blocker prescribed? - No - bradycardia 4. High Intensity Statin (Lipitor 40-80mg  or Crestor 20-40mg ) prescribed? - Yes 5. EF assessed during THIS hospitalization? - Yes 6. For EF <40%, was ACEI/ARB prescribed? - Not Applicable (EF >/= 40%) 7. For EF <40%, Aldosterone Antagonist (Spironolactone or Eplerenone) prescribed? - Not Applicable (EF >/= 40%) 8. Cardiac Rehab Phase II ordered (Included Medically managed Patients)? - Yes      Outstanding Labs/Studies   N/a   Duration of Discharge Encounter   Greater than 30 minutes including physician time.  Signed, Laverda Page NP-C 04/30/2019, 4:04 PM

## 2019-05-02 ENCOUNTER — Telehealth: Payer: Self-pay | Admitting: *Deleted

## 2019-05-02 NOTE — Telephone Encounter (Signed)
Pt was on TCM report admitted 04/28/19 for evaluation of CP. Pt initial troponin 8 but repeat 28, D-dimer positive, CTA negative for PE, no evidence for dissection, some evidence for reflux noted. Heparin drip was started, chest pain resolved.Pt Underwent cardiac cath with ISR in the ramus branch with prior stenting. Successful cutting balloon angioplasty. Pt D/c 04/30/19, and will follow-up w/cardiology in 2 weeks.[name] KitchenJohny Chess

## 2019-05-11 ENCOUNTER — Emergency Department (HOSPITAL_COMMUNITY): Payer: Self-pay

## 2019-05-11 ENCOUNTER — Inpatient Hospital Stay (HOSPITAL_COMMUNITY)
Admission: EM | Admit: 2019-05-11 | Discharge: 2019-05-15 | DRG: 287 | Disposition: A | Discharge status: Home / Self Care | Payer: Self-pay | Attending: Cardiology | Admitting: Cardiology

## 2019-05-11 ENCOUNTER — Other Ambulatory Visit: Payer: Self-pay

## 2019-05-11 ENCOUNTER — Encounter (HOSPITAL_COMMUNITY): Payer: Self-pay | Admitting: Emergency Medicine

## 2019-05-11 DIAGNOSIS — I25110 Atherosclerotic heart disease of native coronary artery with unstable angina pectoris: Principal | ICD-10-CM | POA: Diagnosis present

## 2019-05-11 DIAGNOSIS — E785 Hyperlipidemia, unspecified: Secondary | ICD-10-CM | POA: Diagnosis present

## 2019-05-11 DIAGNOSIS — I10 Essential (primary) hypertension: Secondary | ICD-10-CM

## 2019-05-11 DIAGNOSIS — G43909 Migraine, unspecified, not intractable, without status migrainosus: Secondary | ICD-10-CM | POA: Diagnosis present

## 2019-05-11 DIAGNOSIS — I249 Acute ischemic heart disease, unspecified: Secondary | ICD-10-CM

## 2019-05-11 DIAGNOSIS — K219 Gastro-esophageal reflux disease without esophagitis: Secondary | ICD-10-CM | POA: Diagnosis present

## 2019-05-11 DIAGNOSIS — N182 Chronic kidney disease, stage 2 (mild): Secondary | ICD-10-CM | POA: Diagnosis present

## 2019-05-11 DIAGNOSIS — I214 Non-ST elevation (NSTEMI) myocardial infarction: Secondary | ICD-10-CM

## 2019-05-11 DIAGNOSIS — I200 Unstable angina: Secondary | ICD-10-CM | POA: Diagnosis present

## 2019-05-11 DIAGNOSIS — Z20828 Contact with and (suspected) exposure to other viral communicable diseases: Secondary | ICD-10-CM | POA: Diagnosis present

## 2019-05-11 DIAGNOSIS — Z7902 Long term (current) use of antithrombotics/antiplatelets: Secondary | ICD-10-CM

## 2019-05-11 DIAGNOSIS — I422 Other hypertrophic cardiomyopathy: Secondary | ICD-10-CM

## 2019-05-11 DIAGNOSIS — Z7982 Long term (current) use of aspirin: Secondary | ICD-10-CM

## 2019-05-11 DIAGNOSIS — Z8673 Personal history of transient ischemic attack (TIA), and cerebral infarction without residual deficits: Secondary | ICD-10-CM

## 2019-05-11 DIAGNOSIS — I441 Atrioventricular block, second degree: Secondary | ICD-10-CM | POA: Diagnosis present

## 2019-05-11 DIAGNOSIS — I252 Old myocardial infarction: Secondary | ICD-10-CM

## 2019-05-11 DIAGNOSIS — I129 Hypertensive chronic kidney disease with stage 1 through stage 4 chronic kidney disease, or unspecified chronic kidney disease: Secondary | ICD-10-CM | POA: Diagnosis present

## 2019-05-11 DIAGNOSIS — I25119 Atherosclerotic heart disease of native coronary artery with unspecified angina pectoris: Secondary | ICD-10-CM

## 2019-05-11 DIAGNOSIS — Z955 Presence of coronary angioplasty implant and graft: Secondary | ICD-10-CM

## 2019-05-11 DIAGNOSIS — N1831 Chronic kidney disease, stage 3a: Secondary | ICD-10-CM | POA: Diagnosis present

## 2019-05-11 LAB — BASIC METABOLIC PANEL
Anion gap: 10 (ref 5–15)
BUN: 23 mg/dL — ABNORMAL HIGH (ref 6–20)
CO2: 25 mmol/L (ref 22–32)
Calcium: 9.2 mg/dL (ref 8.9–10.3)
Chloride: 105 mmol/L (ref 98–111)
Creatinine, Ser: 1.68 mg/dL — ABNORMAL HIGH (ref 0.61–1.24)
GFR calc Af Amer: 51 mL/min — ABNORMAL LOW (ref >60)
GFR calc non Af Amer: 44 mL/min — ABNORMAL LOW (ref >60)
Glucose, Bld: 116 mg/dL — ABNORMAL HIGH (ref 70–99)
Potassium: 3.6 mmol/L (ref 3.5–5.1)
Sodium: 140 mmol/L (ref 135–145)

## 2019-05-11 LAB — TROPONIN I (HIGH SENSITIVITY)
Troponin I (High Sensitivity): 18 ng/L — ABNORMAL HIGH (ref <18)
Troponin I (High Sensitivity): 5 ng/L (ref <18)

## 2019-05-11 LAB — CBC
HCT: 30.5 % — ABNORMAL LOW (ref 39.0–52.0)
Hemoglobin: 10 g/dL — ABNORMAL LOW (ref 13.0–17.0)
MCH: 30.1 pg (ref 26.0–34.0)
MCHC: 32.8 g/dL (ref 30.0–36.0)
MCV: 91.9 fL (ref 80.0–100.0)
Platelets: 315 10*3/uL (ref 150–400)
RBC: 3.32 MIL/uL — ABNORMAL LOW (ref 4.22–5.81)
RDW: 12 % (ref 11.5–15.5)
WBC: 7.5 10*3/uL (ref 4.0–10.5)
nRBC: 0 % (ref 0.0–0.2)

## 2019-05-11 MED ORDER — HEPARIN (PORCINE) 25000 UT/250ML-% IV SOLN
1100.0000 [IU]/h | INTRAVENOUS | Status: DC
  Administered 2019-05-11: 900 [IU]/h via INTRAVENOUS
  Administered 2019-05-12 – 2019-05-13 (×2): 1100 [IU]/h via INTRAVENOUS
  Filled 2019-05-11 (×3): qty 250

## 2019-05-11 MED ORDER — HEPARIN BOLUS VIA INFUSION
4000.0000 [IU] | Freq: Once | INTRAVENOUS | Status: AC
  Administered 2019-05-11: 4000 [IU] via INTRAVENOUS
  Filled 2019-05-11: qty 4000

## 2019-05-11 MED ORDER — ONDANSETRON HCL 4 MG/2ML IJ SOLN: 4.0000 mg | Freq: Four times a day (QID) | INTRAMUSCULAR | Status: DC | PRN

## 2019-05-11 MED ORDER — AZILSARTAN-CHLORTHALIDONE 40-25 MG PO TABS: 1.0000 | ORAL_TABLET | Freq: Every day | ORAL | Status: DC

## 2019-05-11 MED ORDER — ROSUVASTATIN CALCIUM 20 MG PO TABS
40.0000 mg | ORAL_TABLET | Freq: Every day | ORAL | Status: DC
  Administered 2019-05-12 – 2019-05-15 (×3): 40 mg via ORAL
  Filled 2019-05-11 (×3): qty 2

## 2019-05-11 MED ORDER — CLOPIDOGREL BISULFATE 75 MG PO TABS
75.0000 mg | ORAL_TABLET | Freq: Every day | ORAL | Status: DC
  Administered 2019-05-12 – 2019-05-15 (×4): 75 mg via ORAL
  Filled 2019-05-11 (×4): qty 1

## 2019-05-11 MED ORDER — CHLORTHALIDONE 25 MG PO TABS
25.0000 mg | ORAL_TABLET | Freq: Every day | ORAL | Status: DC
  Administered 2019-05-11 – 2019-05-12 (×2): 25 mg via ORAL
  Filled 2019-05-11 (×2): qty 1

## 2019-05-11 MED ORDER — ASPIRIN 81 MG PO CHEW
81.0000 mg | CHEWABLE_TABLET | Freq: Every day | ORAL | Status: DC
  Administered 2019-05-11 – 2019-05-15 (×4): 81 mg via ORAL
  Filled 2019-05-11 (×4): qty 1

## 2019-05-11 MED ORDER — ACETAMINOPHEN 325 MG PO TABS: 650.0000 mg | ORAL_TABLET | ORAL | Status: DC | PRN

## 2019-05-11 MED ORDER — IRBESARTAN 300 MG PO TABS
300.0000 mg | ORAL_TABLET | Freq: Every day | ORAL | Status: DC
  Administered 2019-05-11 – 2019-05-12 (×2): 300 mg via ORAL
  Filled 2019-05-11 (×3): qty 1

## 2019-05-11 MED ORDER — NITROGLYCERIN 0.4 MG SL SUBL: 0.4000 mg | SUBLINGUAL_TABLET | SUBLINGUAL | Status: DC | PRN

## 2019-05-11 MED ORDER — SODIUM CHLORIDE 0.9% FLUSH: 3.0000 mL | Freq: Once | INTRAVENOUS | Status: DC

## 2019-05-11 NOTE — ED Provider Notes (Addendum)
[facility] Provider Note   CSN: [id number] Arrival date & time: 05/11/19  1846     History Chief Complaint  Patient presents with  . Chest Pain    [name] is a 58 y.o. male.  Presents to the department after new onset chest pain.  Patient states pain started around 5:00, occurring at rest, initially 10-10 severity, constant, pressure, rating down left arm.  Similar to pain from prior MI.  Pain improved after receiving nitroglycerin and morphine and aspirin from EMS.  Pain now 2 out of 10 in severity.  No pain in left arm.  No associated difficulty breathing.  Patient states he has not had any recent missed doses of his cardiac medications.  Per chart review, recent admission for NSTEMI, multivessel disease, severe 95% restenosis of long ramus stent that received balloon angioplasty.  HPI     Past Medical History:  Diagnosis Date  . Apical variant hypertrophic cardiomyopathy (HCC)   . CAD (coronary artery disease)    a. LHC in 2011 with severe dz in D1/D2 and very distal LAD--> too small for PCI and managed medically. b.  LHC in 10/2013 after an abnormal nuclear study w/ non-obst dz/c. 01/2016 NSTEMI 2.5 x 38 mm Promus DES to Ramus    . Chronic back pain   . 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  Marland Kitchen. HOH (hard of hearing)    rgiht ear  . Hyperlipidemia   . Hypertension   . Migraine headache   . Stroke [facility](HCC)     Patient Active Problem List   Diagnosis Date Noted  . Rising PSA level 03/06/2019  . AC (acromioclavicular) arthritis 11/23/2018  . Family history of colon cancer 10/19/2018  . 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  . Spinal stenosis at L4-L5 level  09/22/2016  . Unstable angina (HCC) 02/25/2016  . NSTEMI (non-ST elevated myocardial infarction) (HCC) 02/24/2016  . Bradycardia   . AV block   . Thiamine deficiency 11/19/2015  . Abnormal brain MRI 11/19/2015  . 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 Chronic renal insufficiency, stage II (mild) 07/02/2014  . Coronary artery disease involving native coronary artery of native heart with angina pectoris (HCC)   . 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/2013  . Drug noncompliance 03/26/2013  . Migraine, unspecified, without mention of intractable migraine without mention of status migrainosus 03/26/2013  . GERD (gastroesophageal reflux disease) 11/14/2012  . Routine general medical examination at a health care facility 07/28/2012  . ERECTILE DYSFUNCTION 08/12/2010  . Hyperlipidemia with target LDL less than 70 10/15/2009  . TOBACCO USE 10/15/2009  . Essential hypertension 10/15/2009    Past Surgical History:  Procedure Laterality Date  . CARDIAC CATHETERIZATION N/A 02/24/2016   Procedure: Left Heart Cath and Coronary Angiography;  Surgeon: [name], MD;  [facility];  Service: Cardiovascular;  Laterality: N/A;  . CARDIAC CATHETERIZATION N/A 02/24/2016   Procedure: Coronary Stent Intervention;  Surgeon: [name], MD;  [facility];  Service: Cardiovascular;  Laterality: N/A;  . CORONARY ANGIOPLASTY WITH STENT PLACEMENT  2017  . CORONARY BALLOON ANGIOPLASTY N/A 04/30/2019   Procedure: CORONARY BALLOON ANGIOPLASTY;  Surgeon: SwazilandJordan, Peter M, MD;  Location:  [facility] INVASIVE CV LAB;  Service: Cardiovascular;  Laterality: N/A;  . INGUINAL HERNIA REPAIR    . LEFT HEART CATH AND CORONARY ANGIOGRAPHY N/A 04/30/2019   Procedure: LEFT HEART CATH AND CORONARY ANGIOGRAPHY;  Surgeon: Dolores Patty, MD;  Location: [facility] INVASIVE CV LAB;  Service: Cardiovascular;  Laterality: N/A;  . LEFT HEART CATHETERIZATION WITH CORONARY ANGIOGRAM N/A  11/22/2013   Procedure: LEFT HEART CATHETERIZATION WITH CORONARY ANGIOGRAM;  Surgeon: [name], MD;  Location: [facility] CATH LAB;  Service: Cardiovascular;  Laterality: N/A;  . TEE WITHOUT CARDIOVERSION N/A 08/22/2015   Procedure: TRANSESOPHAGEAL ECHOCARDIOGRAM (TEE);  Surgeon: Vesta Mixer, MD;  Location[facility] ENDOSCOPY;  Service: Cardiovascular;  Laterality: N/A;       Family History  Problem Relation Age of Onset  . Hypertension Mother   . Heart failure Mother   . Hypertension Father   . Colon cancer Father 57  . Colon cancer Paternal Uncle     Social History   Tobacco Use  . Smoking status: Current Some Day Smoker    Packs/day: 0.00    Types: Cigarettes  . Smokeless tobacco: Never Used  Substance Use Topics  . Alcohol use: No    Alcohol/week: 0.0 standard drinks  . Drug use: No    Home Medications Prior to Admission medications   Medication Sig Start Date End Date Taking? Authorizing Provider  amLODipine (NORVASC) 10 MG tablet Take 1 tablet (10 mg total) by mouth daily. 10/18/18   Etta Grandchild, MD  aspirin 81 MG chewable tablet Chew 1 tablet (81 mg total) by mouth daily. 05/01/19   Arty Baumgartner, NP  Azilsartan-Chlorthalidone (EDARBYCLOR) 40-25 MG TABS Take 1 tablet by mouth daily. 03/06/19   Etta Grandchild, MD  clopidogrel (PLAVIX) 75 MG tablet TAKE 1 TABLET BY MOUTH EVERY DAY WITH BREAKFEST Patient taking differently: Take 75 mg by mouth daily with breakfast.  04/18/18   Lyn Records, MD  Evolocumab (REPATHA SURECLICK) 140 MG/ML SOAJ Inject 140 mg into the skin every 14 (fourteen) days.    [provider]  hydrALAZINE (APRESOLINE) 100 MG tablet Take 1 tablet (100 mg total) by mouth 3 (three) times daily. 10/18/18   Etta Grandchild, MD  nitroGLYCERIN (NITROSTAT) 0.4 MG SL tablet Place 1 tablet (0.4 mg total) under the tongue every 5 (five) minutes as needed for chest pain. 04/30/19 07/29/19  Arty Baumgartner, NP  rosuvastatin (CRESTOR) 40 MG tablet  Take 1 tablet (40 mg total) by mouth daily. 10/18/18   Etta Grandchild, MD  Tetrahydrozoline HCl (VISINE OP) Place 1 drop into both eyes daily as needed (dry eyes).    [provider]  thiamine (CVS B-1) 100 MG tablet Take 1 tablet (100 mg total) by mouth every other day. 03/12/19   Etta Grandchild, MD    Allergies    Lisinopril  Review of Systems   Review of Systems  Constitutional: Negative for chills and fever.  HENT: Negative for ear pain and sore throat.   Eyes: Negative for pain and visual disturbance.  Respiratory: Positive for chest tightness. Negative for cough and shortness of breath.   Cardiovascular: Positive for chest pain. Negative for palpitations.  Gastrointestinal: Negative for abdominal pain and vomiting.  Genitourinary: Negative for dysuria and hematuria.  Musculoskeletal: Negative for arthralgias and back pain.  Skin: Negative for color change and rash.  Neurological: Negative for seizures and syncope.  All other systems reviewed and are negative.   Physical Exam  Updated Vital Signs Ht 5\' 8"  (1.727 m)   Wt 76.3 kg   BMI 25.58 kg/m   Physical Exam Vitals and nursing note reviewed.  Constitutional:      Appearance: He is well-developed.  HENT:     Head: Normocephalic and atraumatic.  Eyes:     Conjunctiva/sclera: Conjunctivae normal.  Cardiovascular:     Rate and Rhythm: Normal rate and regular rhythm.     Heart sounds: No murmur.  Pulmonary:     Effort: Pulmonary effort is normal. No respiratory distress.     Breath sounds: Normal breath sounds.  Abdominal:     Palpations: Abdomen is soft.     Tenderness: There is no abdominal tenderness.  Musculoskeletal:     Cervical back: Neck supple.     Right lower leg: No edema.     Left lower leg: No edema.  Skin:    General: Skin is warm and dry.     Capillary Refill: Capillary refill takes less than 2 seconds.  Neurological:     General: No focal deficit present.     Mental Status: He is  alert.     ED Results / Procedures / Treatments   Labs (all labs ordered are listed, but only abnormal results are displayed) Labs Reviewed  BASIC METABOLIC PANEL - Abnormal; Notable for the following components:      Result Value   Glucose, Bld 116 (*)    BUN 23 (*)    Creatinine, Ser 1.68 (*)    GFR calc non Af Amer 44 (*)    GFR calc Af Amer 51 (*)    All other components within normal limits  CBC - Abnormal; Notable for the following components:   RBC 3.32 (*)    Hemoglobin 10.0 (*)    HCT 30.5 (*)    All other components within normal limits  TROPONIN I (HIGH SENSITIVITY) - Abnormal; Notable for the following components:   Troponin I (High Sensitivity) 18 (*)    All other components within normal limits  HEPARIN LEVEL (UNFRACTIONATED)  CBC  TROPONIN I (HIGH SENSITIVITY)    EKG EKG Interpretation  Date/Time:  Friday May 11 2019 18:55:17 EST Ventricular Rate:  85 PR Interval:  170 QRS Duration: 98 QT Interval:  376 QTC Calculation: 447 R Axis:   81 Text Interpretation: Normal sinus rhythm Moderate voltage criteria for LVH, may be normal variant ( Sokolow-Lyon , Cornell product ) T wave abnormality, consider lateral ischemia Abnormal ECG no acute STEMI Confirmed by [id number] (Marianna Fuss) on 05/11/2019 7:01:57 PM   Radiology DG Chest 2 View  Result Date: 05/11/2019 CLINICAL DATA:  Chest pain EXAM: CHEST - 2 VIEW COMPARISON:  April 28, 2019 FINDINGS: The heart size and mediastinal contours are within normal limits. Both lungs are clear. The visualized skeletal structures are unremarkable. IMPRESSION: No active cardiopulmonary disease. Electronically Signed   By: April 30, 2019 M.D.   On: 05/11/2019 19:41    Procedures .Critical Care Performed by: 14/04/2019, MD Authorized by: Milagros Loll, MD   Critical care provider statement:    Critical care time (minutes):  45   Critical care was necessary to treat or prevent imminent or  life-threatening deterioration of the following conditions:  Cardiac failure and circulatory failure   Critical care was time spent personally by me on the following activities:  Discussions with consultants, evaluation of patient's response to treatment, examination of patient, ordering and performing treatments and interventions, ordering and review of  laboratory studies, ordering and review of radiographic studies, pulse oximetry, re-evaluation of patient's condition, obtaining history from patient or surrogate and review of old charts   (including critical care time)  Medications Ordered in ED Medications  sodium chloride flush (NS) 0.9 % injection 3 mL (has no administration in time range)  nitroGLYCERIN (NITROSTAT) SL tablet 0.4 mg (has no administration in time range)  heparin bolus via infusion 4,000 Units (has no administration in time range)  heparin ADULT infusion 100 units/mL (25000 units/253mL sodium chloride 0.45%) (has no administration in time range)    ED Course  I have reviewed the triage vital signs and the nursing notes.  Pertinent labs & imaging results that were available during my care of the patient were reviewed by me and considered in my medical decision making (see chart for details).  Clinical Course as of May 11 1540  Fri May 11, 2019  1940 Completed initial assessment, pain 2 out of 10 severity, will repeat EKG, give additional dose of nitroglycerin, start heparin for unstable angina, patient already received aspirin   [RD]  1950 Repeat EKG with somewhat deeper T wave inversion in lateral leads, no acute STEMI, but given concerning story and known CAD, will discuss with on-call STEMI doctor   [RD]  2005 Discussed with Dr. Burt Knack, STEMI doc, no acute STEMI on EKG, recommends admission to cardiology service, initiation of heparin gtt; the night fellow will come evaluate patient and place admission orders   [RD]  2034 Cardiology at bedside   [RD]    Clinical  Course User Index [RD] Lucrezia Starch, MD   MDM Rules/Calculators/A&P                        58 year old male presents to ER with new onset of chest pain in setting of known extensive CAD history.  EKG initially without significant change, repeat with slight deepening of T wave inversion in lateral leads. Reviewed with on-call STEMI doc. No acute STEMI. He recommended admission to cardiology service, medical management for now.  Patient pain drastically improved after meds given PTA. He is curretnly well appearing with stable vitals. Initiated heparin gtt for ACS per pharmacy.   Final Clinical Impression(s) / ED Diagnoses Final diagnoses:  Acute coronary syndrome (HCC)  NSTEMI (non-ST elevated myocardial infarction) ([place])  Coronary artery disease involving native heart with angina pectoris, unspecified vessel or lesion type [facility])    Rx / DC Orders ED Discharge Orders    None       Lucrezia Starch, MD 05/11/19 2029    Lucrezia Starch, MD 05/12/19 [phone]

## 2019-05-11 NOTE — Progress Notes (Addendum)
ANTICOAGULATION CONSULT NOTE - Initial Consult  Pharmacy Consult for Heparin  Indication: chest pain/ACS  Allergies  Allergen Reactions  . Lisinopril Swelling    Facial swelling    Patient Measurements: Height: 5\' 8"  (172.7 cm) Weight: 168 lb 3.4 oz (76.3 kg) IBW/kg (Calculated) : 68.4 Heparin Dosing Weight: 76.3 kg  Vital Signs:    Labs: Recent Labs    05/11/19 1852  HGB 10.0*  HCT 30.5*  PLT 315  CREATININE 1.68*  TROPONINIHS 18*    Estimated Creatinine Clearance: 46.9 mL/min (A) (by C-G formula based on SCr of 1.68 mg/dL (H)).   Medical History: Past Medical History:  Diagnosis Date  . Apical variant hypertrophic cardiomyopathy ([place])   . CAD (coronary artery disease)    a. LHC in 2011 with severe dz in D1/D2 and very distal LAD--> too small for PCI and managed medically. b.  LHC in 10/2013 after an abnormal nuclear study w/ non-obst dz/c. 01/2016 NSTEMI 2.5 x 38 mm Promus DES to Ramus    . Chronic back pain   . 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  Marland Kitchen HOH (hard of hearing)    rgiht ear  . Hyperlipidemia   . Hypertension   . Migraine headache   . Stroke [facility])     Medications:  Scheduled:  . heparin  4,000 Units Intravenous Once  . sodium chloride flush  3 mL Intravenous Once    Assessment: Patient is a 47 yom that presented to the ED today with chest pain. Patient just had a recent stent placed 11/30. Patients pain was relieved after receiving nitro and morphine. Pharmacy has been asked to dose heparin in this patient at this time.   Goal of Therapy:  Heparin level 0.3-0.7 units/ml Monitor platelets by anticoagulation protocol: Yes   Plan:  - Heparin bolus 4000 units IV x 1 dose  - Heparin  drip @ 900 units/hr  - Heparin level in 8 hours  - Monitor patient for s/s of bleeding and CBC daily while on heparin  Duanne Limerick PharmD. BCPS  05/11/2019,8:06 PM

## 2019-05-11 NOTE — H&P (Signed)
Cardiology Admission History and Physical:   Patient ID: [name]; MRN: [id number]; DOB: 07-19-1960   Admission date: 05/11/2019  Primary Care Provider:  Etta Grandchild, MD Primary Cardiologist:  [name], MD    Chief Complaint:   Chest pain  History of Present Illness:   [name] is a 58 y.o. male with a history of CAD (recent cutting balloon angioplasty of the ramus intermedius on 05-08-19), apical variant hypertrophic cardiomyopathy, GERD, chronic renal insufficiency, chronic back pain, hyperlipidemia (on rosuvastatin and Repatha), hypertension, tobacco abuse, prior CVA and migraine headaches who presented to the hospital with complaints of chest pain. The patient reports acute onset of chest pain today. It progressively got worse such that at one point it was 10 out of 10 in intensity. The pain spread across the precordium and also radiated down the left arm. He does admit to significant stress related to his job. He works as a Nutritional therapist. He took 1 sublingual nitroglycerin which did not help with the chest pain. The discomfort finally improved once he received morphine and aspirin from EMS. He denied any associated shortness of breath, diaphoresis, nausea or vomiting. He states that he has exertional fatigue. He barely walks 10-20 steps before having to stop because he feels tired. He does admit to intermittent palpitations as well. He denies orthopnea, paroxysmal nocturnal dyspnea or lower extremity edema. There have been no episodes of presyncope or syncope. Of note, the patient was admitted 2 weeks ago with similar symptoms. At that time he underwent angioplasty for restenosis in the ramus intermedius branch.  In the ED his labs were as follows: Potassium 3.6, BUN 23, creatinine 1.68, high-sensitivity troponin 18, hematocrit 30.5 and platelets 315. The ECG revealed voltage criteria for LVH and T wave inversions in the inferolateral leads. The chest x-ray was  unremarkable. He was started on IV heparin with improvement in his chest pain.   Relevant CV Studies: Cardiac catheterization 05-08-2019 1. 3v CAD 2. There is severe 95% restenosis in the 2nd half of the long Ramus stent 3. Mild progression of bifurcation lesion in mid LCX. Now ~ 70% 4. CAD otherwise stable 5. EF 60-65% 6. Successful Cutting Balloon angioplasty of ramus intermedius   Echocardiogram 04/29/2019 1. Left ventricular ejection fraction, by visual estimation, is 60 to 65%. The left ventricle has normal function. Left ventricular septal wall thickness was mildly increased. Mildly increased left ventricular posterior wall thickness. There is no left ventricular hypertrophy. 2. Global right ventricle has normal systolic function.The right ventricular size is normal. No increase in right ventricular wall thickness. 3. Left atrial size was mildly dilated. 4. Right atrial size was normal. 5. The mitral valve is normal in structure. Trace mitral valve regurgitation. No evidence of mitral stenosis. 6. The tricuspid valve is normal in structure. Tricuspid valve regurgitation is not demonstrated. 7. The aortic valve was not well visualized. Aortic valve regurgitation is not visualized. No evidence of aortic valve sclerosis or stenosis. 8. The pulmonic valve was normal in structure. Pulmonic valve regurgitation is not visualized. 9. The inferior vena cava is normal in size with greater than 50% respiratory variability, suggesting right atrial pressure of 3 mmHg.    Past Medical History:  Diagnosis Date  . Apical variant hypertrophic cardiomyopathy (HCC)   . CAD (coronary artery disease)    a. LHC in 2011 with severe dz in D1/D2 and very distal LAD--> too small for PCI and managed medically. b.  LHC in 10/2013 after an abnormal nuclear  study w/ non-obst dz/c. 01/2016 NSTEMI 2.5 x 38 mm Promus DES to Ramus    . Chronic back pain   . 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  Marland Kitchen HOH (hard of hearing)    rgiht ear  . Hyperlipidemia   . Hypertension   . Migraine headache   . Stroke [facility])     Past Surgical History:  Procedure Laterality Date  . CARDIAC CATHETERIZATION N/A 02/24/2016   Procedure: Left Heart Cath and Coronary Angiography;  Surgeon: Sherren Mocha, MD;  Location: [place] [facility];  Service: Cardiovascular;  Laterality: N/A;  . CARDIAC CATHETERIZATION N/A 02/24/2016   Procedure: Coronary Stent Intervention;  Surgeon: Sherren Mocha, MD;  Location: [place] [facility];  Service: Cardiovascular;  Laterality: N/A;  . CORONARY ANGIOPLASTY WITH STENT PLACEMENT  2017  . CORONARY BALLOON ANGIOPLASTY N/A 04/30/2019   Procedure: CORONARY BALLOON ANGIOPLASTY;  Surgeon: Martinique, Peter M, MD;  Location: [place] [facility];  Service: Cardiovascular;  Laterality: N/A;  . INGUINAL HERNIA REPAIR    . LEFT HEART CATH AND CORONARY ANGIOGRAPHY N/A 04/30/2019   Procedure: LEFT HEART CATH AND CORONARY ANGIOGRAPHY;  Surgeon: Jolaine Artist, MD;  Location: [place] [facility];  Service: Cardiovascular;  Laterality: N/A;  . LEFT HEART CATHETERIZATION WITH CORONARY ANGIOGRAM N/A 11/22/2013   Procedure: LEFT HEART CATHETERIZATION WITH CORONARY ANGIOGRAM;  Surgeon: [name], MD;  Location: [facility];  Service: Cardiovascular;  Laterality: N/A;  . TEE WITHOUT CARDIOVERSION N/A 08/22/2015   Procedure: TRANSESOPHAGEAL ECHOCARDIOGRAM (TEE);  Surgeon: Thayer Headings, MD;  Location: [place];  Service: Cardiovascular;  Laterality: N/A;     Medications Prior to Admission: Prior to Admission medications   Medication Sig Start Date End Date Taking? Authorizing Provider  amLODipine (NORVASC) 10 MG tablet Take 1 tablet  (10 mg total) by mouth daily. 10/18/18   [name], MD  aspirin 81 MG chewable tablet Chew 1 tablet (81 mg total) by mouth daily. 05/01/19   Cheryln Manly, NP  Azilsartan-Chlorthalidone (EDARBYCLOR) 40-25 MG TABS Take 1 tablet by mouth daily. 03/06/19   [name], MD  clopidogrel (PLAVIX) 75 MG tablet TAKE 1 TABLET BY MOUTH EVERY DAY WITH BREAKFEST Patient taking differently: Take 75 mg by mouth daily with breakfast.  04/18/18   Belva Crome, MD  Evolocumab (REPATHA SURECLICK) 938 MG/ML SOAJ Inject 140 mg into the skin every 14 (fourteen) days.    [provider]  hydrALAZINE (APRESOLINE) 100 MG tablet Take 1 tablet (100 mg total) by mouth 3 (three) times daily. 10/18/18   [name], MD  nitroGLYCERIN (NITROSTAT) 0.4 MG SL tablet Place 1 tablet (0.4 mg total) under the tongue every 5 (five) minutes as needed for chest pain. 04/30/19 07/29/19  Cheryln Manly, NP  rosuvastatin (CRESTOR) 40 MG tablet Take 1 tablet (40 mg total) by mouth daily. 10/18/18   [name], MD  Tetrahydrozoline HCl (VISINE OP) Place 1 drop into both eyes daily as needed (dry eyes).    [provider]  thiamine ([facility] B-1) 100 MG tablet Take 1 tablet (100 mg total) by mouth every other day. 03/12/19   [name], MD  Allergies:    Allergies  Allergen Reactions  . Lisinopril Swelling    Facial swelling    Social History:   Social History   Socioeconomic History  . Marital status: Married    Spouse name: Not on file  . Number of children: Not on file  . Years of education: Not on file  . Highest education level: Not on file  Occupational History  . Not on file  Tobacco Use  . Smoking status: Current Some Day Smoker    Packs/day: 0.00    Types: Cigarettes  . Smokeless tobacco: Never Used  Substance and Sexual Activity  . Alcohol use: No    Alcohol/week: 0.0 standard drinks  . Drug use: No  . Sexual activity: Not on file  Other Topics Concern  . Not on  file  Social History Narrative  . Not on file   Social Determinants of Health   Financial Resource Strain:   . Difficulty of Paying Living Expenses: Not on file  Food Insecurity:   . Worried About Programme researcher, broadcasting/film/videounning Out of Food in the Last Year: Not on file  . Ran Out of Food in the Last Year: Not on file  Transportation Needs:   . Lack of Transportation (Medical): Not on file  . Lack of Transportation (Non-Medical): Not on file  Physical Activity:   . Days of Exercise per Week: Not on file  . Minutes of Exercise per Session: Not on file  Stress:   . Feeling of Stress : Not on file  Social Connections:   . Frequency of Communication with Friends and Family: Not on file  . Frequency of Social Gatherings with Friends and Family: Not on file  . Attends Religious Services: Not on file  . Active Member of Clubs or Organizations: Not on file  . Attends BankerClub or Organization Meetings: Not on file  . Marital Status: Not on file  Intimate Partner Violence:   . Fear of Current or Ex-Partner: Not on file  . Emotionally Abused: Not on file  . Physically Abused: Not on file  . Sexually Abused: Not on file     Family History:   The patient's family history includes Colon cancer in his paternal uncle; Colon cancer (age of onset: 8466) in his father; Heart failure in his mother; Hypertension in his father and mother.     Review of Systems: [y] = yes, [ ]  = no   . General: Weight gain [ ] ; Weight loss [ ] ; Anorexia [ ] ; Fatigue [Y]; Fever [ ] ; Chills [ ] ; Weakness [Y]  . Cardiac: Chest pain/pressure [Y]; Resting SOB [ ] ; Exertional SOB [ ] ; Orthopnea [ ] ; Pedal Edema [ ] ; Palpitations [Y]; Syncope [ ] ; Presyncope [ ] ; Paroxysmal nocturnal dyspnea[ ]   . Pulmonary: Cough [ ] ; Wheezing[ ] ; Hemoptysis[ ] ; Sputum [ ] ; Snoring [ ]   . GI: Vomiting[ ] ; Dysphagia[ ] ; Melena[ ] ; Hematochezia [ ] ; Heartburn[ ] ; Abdominal pain [ ] ; Constipation [ ] ; Diarrhea [ ] ; BRBPR [ ]   . GU: Hematuria[ ] ; Dysuria [ ] ; Nocturia[  ]  . Vascular: Pain in legs with walking [ ] ; Pain in feet with lying flat [ ] ; Non-healing sores [ ] ; Stroke [ ] ; TIA [ ] ; Slurred speech [ ] ;  . Neuro: Headaches[ ] ; Vertigo[ ] ; Seizures[ ] ; Paresthesias[ ] ;Blurred vision [ ] ; Diplopia [ ] ; Vision changes [ ]   . Ortho/Skin: Arthritis [ ] ; Joint pain [ ] ; Muscle pain [ ] ; Joint swelling [ ] ; Back  Pain [ ] ; Rash [ ]   . Psych: Depression[ ] ; Anxiety[ ]   . Heme: Bleeding problems [ ] ; Clotting disorders [ ] ; Anemia [ ]   . Endocrine: Diabetes [ ] ; Thyroid dysfunction[ ]      Physical Exam/Data:   Vitals:   05/11/19 2000  Weight: 76.3 kg  Height: 5\' 8"  (1.727 m)   No intake or output data in the 24 hours ending 05/11/19 2101 Filed Weights   05/11/19 2000  Weight: 76.3 kg   Body mass index is 25.58 kg/m.  General:  Well nourished, well developed, in no acute distress HEENT: normal Lymph: no adenopathy Neck: no JVD Endocrine:  No thryomegaly Vascular: No carotid bruits; FA pulses 2+ bilaterally without bruits  Cardiac:  normal S1, S2; RRR; 1/6 systolic murmur Lungs:  clear to auscultation bilaterally, no wheezing, rhonchi or rales  Abd: soft, nontender, no hepatomegaly  Ext: no edema Musculoskeletal:  No deformities, BUE and BLE strength normal and equal Skin: warm and dry  Neuro:  CNs 2-12 intact, no focal abnormalities noted Psych:  Normal affect    EKG:  The ECG that was done 05/11/2019 (at 19:42) was personally reviewed and demonstrates sinus rhythm, ventricular rate 70 bpm, voltage criteria for LVH and T wave inversions in the inferolateral leads. Compared to the ECG from 04/30/2019 the T wave changes were new.   Laboratory Data:  Chemistry Recent Labs  Lab 05/11/19 1852  NA 140  K 3.6  CL 105  CO2 25  GLUCOSE 116*  BUN 23*  CREATININE 1.68*  CALCIUM 9.2  GFRNONAA 44*  GFRAA 51*  ANIONGAP 10    No results for input(s): PROT, ALBUMIN, AST, ALT, ALKPHOS, BILITOT in the last 168 hours. Hematology Recent Labs    Lab 05/11/19 1852  WBC 7.5  RBC 3.32*  HGB 10.0*  HCT 30.5*  MCV 91.9  MCH 30.1  MCHC 32.8  RDW 12.0  PLT 315   Cardiac EnzymesNo results for input(s): TROPONINI in the last 168 hours. No results for input(s): TROPIPOC in the last 168 hours.  BNPNo results for input(s): BNP, PROBNP in the last 168 hours.  DDimer No results for input(s): DDIMER in the last 168 hours.  Radiology/Studies:  DG Chest 2 View  Result Date: 05/11/2019 CLINICAL DATA:  Chest pain EXAM: CHEST - 2 VIEW COMPARISON:  April 28, 2019 FINDINGS: The heart size and mediastinal contours are within normal limits. Both lungs are clear. The visualized skeletal structures are unremarkable. IMPRESSION: No active cardiopulmonary disease. Electronically Signed   By: Katherine Mantle M.D.   On: 05/11/2019 19:41    Assessment and Plan:    1. Unstable angina  - Monitor on telemetry - Trend cardiac biomarkers and obtain serial ECGs - Continue aspirin 81 mg daily - Continue Plavix 75 mg daily - Continue high dose statins. Check a lipid panel in the morning. - Continue intravenous unfractionated heparin per pharmacy protocol -  Please make the patient NPO (except medications) after midnight  2. Apical variant hypertrophic cardiomyopathy His previous studies have documented hyperdynamic systolic function with only a mild intracavitary gradient. It is unclear if he has ever had a trial of beta-blockers. There is a mention of bradycardia in the EHR. Back in 2017 a cardiac monitor revealed a 3.2-second pause along with transient second-degree AV block.  - Consider a repeat cardiac monitor for 14 days as an outpatient - Avoid episodes of significant dehydration - We will hold hydralazine and amlodipine today since SBP is <  100 mmHg at the time of his evaluation    Severity of Illness: The appropriate patient status for this patient is INPATIENT. Inpatient status is judged to be reasonable and necessary in order to  provide the required intensity of service to ensure the patient's safety. The patient's presenting symptoms, physical exam findings, and initial radiographic and laboratory data in the context of their chronic comorbidities is felt to place them at high risk for further clinical deterioration. Furthermore, it is not anticipated that the patient will be medically stable for discharge from the hospital within 2 midnights of admission. The following factors support the patient status of inpatient.   " The patient's presenting symptoms include chest pain. " The worrisome physical exam findings include systolic murmur. " The initial radiographic and laboratory data are worrisome because of T wave changes on ECG. " The chronic co-morbidities include LV apical hypertrophy.   * I certify that at the point of admission it is my clinical judgment that the patient will require inpatient hospital care spanning beyond 2 midnights from the point of admission due to high intensity of service, high risk for further deterioration and high frequency of surveillance required.*    For questions or updates, please contact [facility] Please consult [email or web address] for contact info under Cardiology/STEMI.    Signed, Lonie PeakMohammed W Malayja Freund, MD  05/11/2019 9:01 PM

## 2019-05-11 NOTE — ED Triage Notes (Signed)
Pt arrives from home after a sudden onset of chest pain- pt had stent placed just 04/30/19. Pt was given 324 asa, 2 sl nitro and 6 mg morphine by ems pts pain went from 10/10 to 2/10 on arrival to ED.

## 2019-05-12 ENCOUNTER — Encounter (HOSPITAL_COMMUNITY): Payer: Self-pay | Admitting: Cardiovascular Disease

## 2019-05-12 LAB — LIPID PANEL
Cholesterol: 77 mg/dL (ref 0–200)
HDL: 43 mg/dL (ref >40)
LDL Cholesterol: 27 mg/dL (ref 0–99)
Total CHOL/HDL Ratio: 1.8 RATIO
Triglycerides: 37 mg/dL (ref <150)
VLDL: 7 mg/dL (ref 0–40)

## 2019-05-12 LAB — SARS CORONAVIRUS 2 (TAT 6-24 HRS): SARS Coronavirus 2: NEGATIVE

## 2019-05-12 LAB — CBC
HCT: 29.9 % — ABNORMAL LOW (ref 39.0–52.0)
Hemoglobin: 9.7 g/dL — ABNORMAL LOW (ref 13.0–17.0)
MCH: 29.8 pg (ref 26.0–34.0)
MCHC: 32.4 g/dL (ref 30.0–36.0)
MCV: 92 fL (ref 80.0–100.0)
Platelets: 274 10*3/uL (ref 150–400)
RBC: 3.25 MIL/uL — ABNORMAL LOW (ref 4.22–5.81)
RDW: 12.2 % (ref 11.5–15.5)
WBC: 5.7 10*3/uL (ref 4.0–10.5)
nRBC: 0 % (ref 0.0–0.2)

## 2019-05-12 LAB — BASIC METABOLIC PANEL
Anion gap: 6 (ref 5–15)
BUN: 23 mg/dL — ABNORMAL HIGH (ref 6–20)
CO2: 26 mmol/L (ref 22–32)
Calcium: 9 mg/dL (ref 8.9–10.3)
Chloride: 108 mmol/L (ref 98–111)
Creatinine, Ser: 1.56 mg/dL — ABNORMAL HIGH (ref 0.61–1.24)
GFR calc Af Amer: 56 mL/min — ABNORMAL LOW (ref >60)
GFR calc non Af Amer: 49 mL/min — ABNORMAL LOW (ref >60)
Glucose, Bld: 102 mg/dL — ABNORMAL HIGH (ref 70–99)
Potassium: 3.6 mmol/L (ref 3.5–5.1)
Sodium: 140 mmol/L (ref 135–145)

## 2019-05-12 LAB — HEPARIN LEVEL (UNFRACTIONATED)
Heparin Unfractionated: 0.21 IU/mL — ABNORMAL LOW (ref 0.30–0.70)
Heparin Unfractionated: 0.45 IU/mL (ref 0.30–0.70)

## 2019-05-12 LAB — PROTIME-INR
INR: 1 (ref 0.8–1.2)
Prothrombin Time: 13.1 seconds (ref 11.4–15.2)

## 2019-05-12 MED ORDER — HEPARIN BOLUS VIA INFUSION
2000.0000 [IU] | Freq: Once | INTRAVENOUS | Status: AC
  Administered 2019-05-12: 2000 [IU] via INTRAVENOUS
  Filled 2019-05-12: qty 2000

## 2019-05-12 MED ORDER — ENSURE ENLIVE PO LIQD
237.0000 mL | Freq: Two times a day (BID) | ORAL | Status: DC
  Administered 2019-05-12 – 2019-05-15 (×3): 237 mL via ORAL

## 2019-05-12 NOTE — Progress Notes (Signed)
Cardiology Progress Note  Patient ID: Dontel Harshberger MRN: [id number] DOB: 10-02-2014 Date of Encounter: 05/12/2019  Primary Cardiologist: [name], MD  Subjective  Admitted with concerns for unstable angina overnight.  Reports he does not have any pain with sitting still but can get it with any movement.  Recent balloon angioplasty to the ramus artery is concerning.  He does have known extensive three-vessel CAD.  Heart rate in the 60s on no AV nodal agents blood pressure well controlled no room to move.  ROS:  All other ROS reviewed and negative. Pertinent positives noted in the HPI.     Inpatient Medications  Scheduled Meds: . aspirin  81 mg Oral Daily  . clopidogrel  75 mg Oral Q breakfast  . irbesartan  300 mg Oral Daily  . rosuvastatin  40 mg Oral Daily  . sodium chloride flush  3 mL Intravenous Once   Continuous Infusions: . heparin 1,100 Units/hr (05/12/19 0437)   PRN Meds: acetaminophen, nitroGLYCERIN, ondansetron (ZOFRAN) IV   Vital Signs   Vitals:   05/12/19 0000 05/12/19 0515 05/12/19 0526 05/12/19 0727  BP:  (!) 103/59  (!) 105/57  Pulse:  (!) 57  62  Resp: 17 16  18   Temp:  98.2 F (36.8 C)  98.1 F (36.7 C)  TempSrc:  Oral  Oral  SpO2:  99%  100%  Weight:   75.3 kg   Height:        Intake/Output Summary (Last 24 hours) at 05/12/2019 1038 Last data filed at 05/12/2019 0600 Gross per 24 hour  Intake 249.67 ml  Output 700 ml  Net -450.33 ml   Last 3 Weights 05/12/2019 05/11/2019 05/11/2019  Weight (lbs) 166 lb 1.6 oz 168 lb 4.8 oz 168 lb 3.4 oz  Weight (kg) 75.342 kg 76.34 kg 76.3 kg      Telemetry  Overnight telemetry shows normal sinus rhythm with heart rate in the 60s, which I personally reviewed.   ECG  The most recent ECG shows normal sinus rhythm, heart rate 70, LVH with repolarization abnormality, pretty profound T wave inversions in the anterolateral leads, which I personally reviewed.   Physical Exam   Vitals:   05/12/19  0000 05/12/19 0515 05/12/19 0526 05/12/19 0727  BP:  (!) 103/59  (!) 105/57  Pulse:  (!) 57  62  Resp: 17 16  18   Temp:  98.2 F (36.8 C)  98.1 F (36.7 C)  TempSrc:  Oral  Oral  SpO2:  99%  100%  Weight:   75.3 kg   Height:         Intake/Output Summary (Last 24 hours) at 05/12/2019 1038 Last data filed at 05/12/2019 0600 Gross per 24 hour  Intake 249.67 ml  Output 700 ml  Net -450.33 ml    Last 3 Weights 05/12/2019 05/11/2019 05/11/2019  Weight (lbs) 166 lb 1.6 oz 168 lb 4.8 oz 168 lb 3.4 oz  Weight (kg) 75.342 kg 76.34 kg 76.3 kg    Body mass index is 25.26 kg/m.  General: Well nourished, well developed, in no acute distress Head: Atraumatic, normal size  Eyes: PEERLA, EOMI  Neck: Supple, no JVD Endocrine: No thryomegaly Cardiac: Normal S1, S2; RRR; no murmurs, rubs, or gallops Lungs: Clear to auscultation bilaterally, no wheezing, rhonchi or rales  Abd: Soft, nontender, no hepatomegaly  Ext: No edema, pulses 2+ Musculoskeletal: No deformities, BUE and BLE strength normal and equal Skin: Warm and dry, no rashes   Neuro: Alert and  oriented to person, place, time, and situation, CNII-XII grossly intact, no focal deficits  Psych: Normal mood and affect   Labs  High Sensitivity Troponin:   Recent Labs  Lab 04/28/19 2034 04/28/19 2248 04/29/19 0623 05/11/19 1852 05/11/19 2051  TROPONINIHS 8 28* 46* 18* 5     Cardiac EnzymesNo results for input(s): TROPONINI in the last 168 hours. No results for input(s): TROPIPOC in the last 168 hours.  Chemistry Recent Labs  Lab 05/11/19 1852 05/12/19 0317  NA 140 140  K 3.6 3.6  CL 105 108  CO2 25 26  GLUCOSE 116* 102*  BUN 23* 23*  CREATININE 1.68* 1.56*  CALCIUM 9.2 9.0  GFRNONAA 44* 49*  GFRAA 51* 56*  ANIONGAP 10 6    Hematology Recent Labs  Lab 05/11/19 1852 05/12/19 0317  WBC 7.5 5.7  RBC 3.32* 3.25*  HGB 10.0* 9.7*  HCT 30.5* 29.9*  MCV 91.9 92.0  MCH 30.1 29.8  MCHC 32.8 32.4  RDW 12.0 12.2  PLT  315 274   BNPNo results for input(s): BNP, PROBNP in the last 168 hours.  DDimer No results for input(s): DDIMER in the last 168 hours.   Radiology  DG Chest 2 View  Result Date: 05/11/2019 CLINICAL DATA:  Chest pain EXAM: CHEST - 2 VIEW COMPARISON:  April 28, 2019 FINDINGS: The heart size and mediastinal contours are within normal limits. Both lungs are clear. The visualized skeletal structures are unremarkable. IMPRESSION: No active cardiopulmonary disease. Electronically Signed   By: Katherine Mantle M.D.   On: 05/11/2019 19:41    Cardiac Studies  TTE 04/29/2019  1. Left ventricular ejection fraction, by visual estimation, is 60 to 65%. The left ventricle has normal function. Left ventricular septal wall thickness was mildly increased. Mildly increased left ventricular posterior wall thickness. There is no left  ventricular hypertrophy.  2. Global right ventricle has normal systolic function.The right ventricular size is normal. No increase in right ventricular wall thickness.  3. Left atrial size was mildly dilated.  4. Right atrial size was normal.  5. The mitral valve is normal in structure. Trace mitral valve regurgitation. No evidence of mitral stenosis.  6. The tricuspid valve is normal in structure. Tricuspid valve regurgitation is not demonstrated.  7. The aortic valve was not well visualized. Aortic valve regurgitation is not visualized. No evidence of aortic valve sclerosis or stenosis.  8. The pulmonic valve was normal in structure. Pulmonic valve regurgitation is not visualized.  9. The inferior vena cava is normal in size with greater than 50% respiratory variability, suggesting right atrial pressure of 3 mmHg.  LHC 04/30/2019  Prox LAD to Mid LAD lesion is 30% stenosed.  Dist LAD lesion is 95% stenosed.  Ost 2nd Diag to 2nd Diag lesion is 80% stenosed.  1st Diag lesion is 100% stenosed.  Balloon angioplasty was performed.  Lat 2nd Mrg lesion is 95%  stenosed.  1st RPL lesion is 50% stenosed.  Ramus lesion is 95% stenosed.  Ost LM lesion is 20% stenosed.  Prox RCA lesion is 30% stenosed.  Dist RCA lesion is 20% stenosed.  Mid Cx lesion is 70% stenosed.  RPAV lesion is 50% stenosed.    Findings:  1. 3v CAD 2. There is severe 95% restenosis in the 2nd half of the long Ramus stent 3. Mild progression of bifurcation lesion in mid LCX. Now ~ 70% 4. CAD otherwise stable 5. EF 60-65% 6. LV = 110/1    Patient Profile  Amed  Coolman is a 58 y.o. male with reported history of apical variant hypertrophic cardiomyopathy, CAD (extensive three-vessel CAD with recent ISR of ramus intermedius stent status post balloon angioplasty on 04/30/2019), hypertension, CKD 3 who was admitted on 12/11 with concerns for unstable angina.  Assessment & Plan   1.  Unstable angina/three-vessel CAD: Left heart cath on 11/30 demonstrated pretty significant three-vessel CAD diffusely.  He did have a 95% ISR of the ramus intermedius stent that underwent balloon angioplasty.  He reports he is really had no benefit since that time.  He reports he gets pretty profound chest pressure in the central chest with minimal movement, walking up to 20 feet.  He reports yesterday when he got off work he began to have the pain at rest.  He said the pain lasted until he arrived to the emergency room and received nitroglycerin and heparin drip.  Given his recent angioplasty, I am concerned about the patency of the recent intervention.  I discussed with him the risk benefit of proceeding with cardiac cath.  He does have CKD stage III.  However given his continued symptoms that appear to have now worsened at rest I think we have no choice but to pursue a cardiac catheterization on Monday.  He has limited blood pressure options.  His heart rate is in the 60s and has had known conduction disease in the past with beta-blockers.  He remains on an ARB for CKD.  Given his limited  options for antianginal therapy, this also leads me to think he will need cardiac cath.  We will continue heparin aspirin and Plavix for now.  We will tentatively plan for cardiac cath on Monday.   2.  Apical variant hypertrophic cardiomyopathy: Echo really unimpressive for this diagnosis.  Nonetheless he is on appropriate medical therapy.  Beta-blockers cannot be used due to transient second-degree AV block.  His heart rate is also in the 60s at baseline without any AV nodal agents.  I think we are overall concern for an unstable angina picture versus symptoms related to this condition.  3.  Hypertension: Continue home Avapro in setting of CKD.  4.  CKD stage III: Creatinine stable.  We will plan for hydration tomorrow prior to cath.  FEN -no IVF -diet: general -dvt ppx: heparin drip -code: full   For questions or updates, please contact [facility] Please consult [email or web address] for contact info under    Signed, Gerri Spore T. Flora Lipps, MD [facility]  [facility]  05/12/2019 10:38 AM

## 2019-05-12 NOTE — Progress Notes (Signed)
Initial Nutrition Assessment  DOCUMENTATION CODES:   Not applicable  INTERVENTION:  Ensure Enlive po BID, each supplement provides 350 kcal and 20 grams of protein  NUTRITION DIAGNOSIS:   Increased nutrient needs related to acute illness, chronic illness(CAD s/p recent balloon angioplasty admitted with untable angina) as evidenced by estimated needs.  GOAL:   Patient will meet greater than or equal to 90% of their needs   MONITOR:   PO intake, Weight trends, Labs, I & O's, Supplement acceptance  REASON FOR ASSESSMENT:   Malnutrition Screening Tool    ASSESSMENT:  RD working remotely.  58 year old male with history of CAD s/p recent cutting balloon angioplasty of ramus intermedius on 11/30, apical variant hypertrophic cardiomyopathy, GERD, chronic renal insufficiency, chronic back pain, HLD, HTN, prior CVA and migraine headaches. Patient presented to ED with complaints of progressively worsening chest pain that spread across precordium and radiated down left arm s/p nitroglycerin and admitted with unstable angina.  11/30 Left heart cath  Per chart review, patient with limited options for antianginal therapy, cardiac cath tentatively planned for Monday.  Unable to obtain nutrition history at this time. Per chart pt is eating 100% of meals at this time. Will continue to monitor for po intake and provide Ensure to aid with calorie/protein needs.  I/Os: -450 ml since admit UOP: 700 ml since admit  Patient has lost 17.4 lbs (9.5%) over the past 3 months which is severe for time frame.  04/30/19 76.3 kg  03/06/19 81.8 kg  02/26/19 83.2 kg  11/02/18 83.5 kg  10/18/18 85.4 kg  06/05/18 80.6 kg    Medications and labs reviewed  NUTRITION - FOCUSED PHYSICAL EXAM: Unable to complete at this time, RD working remotely.  Diet Order:   Diet Order            Diet regular Room service appropriate? Yes; Fluid consistency: Thin  Diet effective now              EDUCATION  NEEDS:   No education needs have been identified at this time  Skin:  Skin Assessment: Reviewed RN Assessment  Last BM:  12/11  Height:   Ht Readings from Last 1 Encounters:  05/11/19 5\' 8"  (1.727 m)    Weight:   Wt Readings from Last 1 Encounters:  05/12/19 75.3 kg    Ideal Body Weight:  70 kg  BMI:  Body mass index is 25.26 kg/m.  Estimated Nutritional Needs:   Kcal:  1900-2100  Protein:  85-95  Fluid:  >/= 1.9 L/day   Lajuan Lines, RD, LDN Clinical Nutrition [facility] Telephone [phone] After Hours/Weekend Pager: [phone]

## 2019-05-12 NOTE — Progress Notes (Signed)
ANTICOAGULATION CONSULT NOTE - Follow Up Consult  Pharmacy Consult for heparin Indication: CP after recent PCI  Labs: Recent Labs    05/11/19 1852 05/11/19 2051 05/12/19 0317  HGB 10.0*  --  9.7*  HCT 30.5*  --  29.9*  PLT 315  --  274  LABPROT  --   --  13.1  INR  --   --  1.0  HEPARINUNFRC  --   --  0.21*  CREATININE 1.68*  --  1.56*  TROPONINIHS 18* 5  --     Assessment: 58yo male subtherapeutic on heparin with initial dosing for USAP; no gtt issues or signs of bleeding per RN.  Goal of Therapy:  Heparin level 0.3-0.7 units/ml   Plan:  Will rebolus with heparin 2000 units and increase heparin gtt by 2-3 units/kg/hr to 1100 units/hr and check level in 6 hours.    Wynona Neat, PharmD, BCPS  05/12/2019,4:21 AM

## 2019-05-12 NOTE — Progress Notes (Signed)
ANTICOAGULATION CONSULT NOTE - Follow Up Consult  Pharmacy Consult for heparin Indication: chest pain/ACS  Allergies  Allergen Reactions  . Lisinopril Swelling    Facial swelling    Patient Measurements: Height: 5\' 8"  (172.7 cm) Weight: 166 lb 1.6 oz (75.3 kg)(scale b) IBW/kg (Calculated) : 68.4 Heparin Dosing Weight: 76 kg  Vital Signs: Temp: 98.1 F (36.7 C) (12/12 0727) Temp Source: Oral (12/12 0727) BP: 105/57 (12/12 0727) Pulse Rate: 62 (12/12 0727)  Labs: Recent Labs    05/11/19 1852 05/11/19 2051 05/12/19 0317 05/12/19 1056  HGB 10.0*  --  9.7*  --   HCT 30.5*  --  29.9*  --   PLT 315  --  274  --   LABPROT  --   --  13.1  --   INR  --   --  1.0  --   HEPARINUNFRC  --   --  0.21* 0.45  CREATININE 1.68*  --  1.56*  --   TROPONINIHS 18* 5  --   --     Estimated Creatinine Clearance: 50.5 mL/min (A) (by C-G formula based on SCr of 1.56 mg/dL (H)).   Medications:  Scheduled:  . aspirin  81 mg Oral Daily  . clopidogrel  75 mg Oral Q breakfast  . irbesartan  300 mg Oral Daily  . rosuvastatin  40 mg Oral Daily  . sodium chloride flush  3 mL Intravenous Once    Assessment: 54 yom presenting with chest pain and CAD s/p recent angioplasty on 11/30, pharmacy to dose heparin. No anticoagulation PTA. Cardiac cath tentatively planned for Monday.   Heparin level this morning is therapeutic at 0.45 on 1100 units/hour. H&H is stable at 9.7/29.9, plts are wnl. Of note, patient is on DAPT.    Goal of Therapy:  Heparin level 0.3-0.7 units/ml Monitor platelets by anticoagulation protocol: Yes   Plan:  Continue heparin infusion at 1100 units/hr Check anti-Xa level daily while on heparin Continue to monitor H&H and platelets and signs of bleeding   Thank you,   Eddie Candle, PharmD PGY-1 Pharmacy Resident   Please check amion for clinical pharmacist contact number   05/12/2019,11:48 AM

## 2019-05-13 LAB — BASIC METABOLIC PANEL
Anion gap: 8 (ref 5–15)
BUN: 21 mg/dL — ABNORMAL HIGH (ref 6–20)
CO2: 27 mmol/L (ref 22–32)
Calcium: 9.1 mg/dL (ref 8.9–10.3)
Chloride: 104 mmol/L (ref 98–111)
Creatinine, Ser: 1.44 mg/dL — ABNORMAL HIGH (ref 0.61–1.24)
GFR calc Af Amer: >60 mL/min (ref >60)
GFR calc non Af Amer: 54 mL/min — ABNORMAL LOW (ref >60)
Glucose, Bld: 93 mg/dL (ref 70–99)
Potassium: 4.1 mmol/L (ref 3.5–5.1)
Sodium: 139 mmol/L (ref 135–145)

## 2019-05-13 LAB — CBC
HCT: 31.7 % — ABNORMAL LOW (ref 39.0–52.0)
Hemoglobin: 10.1 g/dL — ABNORMAL LOW (ref 13.0–17.0)
MCH: 29.9 pg (ref 26.0–34.0)
MCHC: 31.9 g/dL (ref 30.0–36.0)
MCV: 93.8 fL (ref 80.0–100.0)
Platelets: 309 10*3/uL (ref 150–400)
RBC: 3.38 MIL/uL — ABNORMAL LOW (ref 4.22–5.81)
RDW: 12.2 % (ref 11.5–15.5)
WBC: 5.1 10*3/uL (ref 4.0–10.5)
nRBC: 0 % (ref 0.0–0.2)

## 2019-05-13 LAB — HEPARIN LEVEL (UNFRACTIONATED): Heparin Unfractionated: 0.37 IU/mL (ref 0.30–0.70)

## 2019-05-13 MED ORDER — SODIUM CHLORIDE 0.9 % IV SOLN
INTRAVENOUS | Status: AC
  Administered 2019-05-13 (×2): via INTRAVENOUS

## 2019-05-13 MED ORDER — SODIUM CHLORIDE 0.9 % WEIGHT BASED INFUSION
3.0000 mL/kg/h | INTRAVENOUS | Status: DC
  Administered 2019-05-14: 3 mL/kg/h via INTRAVENOUS

## 2019-05-13 MED ORDER — SODIUM CHLORIDE 0.9 % WEIGHT BASED INFUSION: 1.0000 mL/kg/h | INTRAVENOUS | Status: DC

## 2019-05-13 MED ORDER — SODIUM CHLORIDE 0.9% FLUSH
3.0000 mL | Freq: Two times a day (BID) | INTRAVENOUS | Status: DC
  Administered 2019-05-13 – 2019-05-15 (×4): 3 mL via INTRAVENOUS

## 2019-05-13 MED ORDER — SODIUM CHLORIDE 0.9% FLUSH: 3.0000 mL | INTRAVENOUS | Status: DC | PRN

## 2019-05-13 MED ORDER — SODIUM CHLORIDE 0.9 % IV SOLN: 250.0000 mL | INTRAVENOUS | Status: DC | PRN

## 2019-05-13 MED ORDER — ASPIRIN 81 MG PO CHEW
81.0000 mg | CHEWABLE_TABLET | ORAL | Status: AC
  Administered 2019-05-14: 81 mg via ORAL
  Filled 2019-05-13: qty 1

## 2019-05-13 NOTE — Progress Notes (Signed)
ANTICOAGULATION CONSULT NOTE - Follow Up Consult  Pharmacy Consult for heparin Indication: chest pain/ACS  Allergies  Allergen Reactions  . Lisinopril Swelling    Facial swelling    Patient Measurements: Height: 5\' 8"  (172.7 cm) Weight: 165 lb (74.8 kg)(scale b) IBW/kg (Calculated) : 68.4 Heparin Dosing Weight: 76 kg  Vital Signs: Temp: 98.2 F (36.8 C) (12/13 0722) Temp Source: Oral (12/13 0722) BP: 93/62 (12/13 0722) Pulse Rate: 55 (12/13 0722)  Labs: Recent Labs    05/11/19 1852 05/11/19 2051 05/12/19 0317 05/12/19 1056 05/13/19 0417 05/13/19 0659  HGB 10.0*  --  9.7*  --  10.1*  --   HCT 30.5*  --  29.9*  --  31.7*  --   PLT 315  --  274  --  309  --   LABPROT  --   --  13.1  --   --   --   INR  --   --  1.0  --   --   --   HEPARINUNFRC  --   --  0.21* 0.45 0.37  --   CREATININE 1.68*  --  1.56*  --   --  1.44*  TROPONINIHS 18* 5  --   --   --   --     Estimated Creatinine Clearance: 54.8 mL/min (A) (by C-G formula based on SCr of 1.44 mg/dL (H)).   Medications:  Scheduled:  . aspirin  81 mg Oral Daily  . clopidogrel  75 mg Oral Q breakfast  . feeding supplement (ENSURE ENLIVE)  237 mL Oral BID BM  . irbesartan  300 mg Oral Daily  . rosuvastatin  40 mg Oral Daily  . sodium chloride flush  3 mL Intravenous Once  . sodium chloride flush  3 mL Intravenous Q12H    Assessment: 18 yom presenting with chest pain and CAD s/p recent angioplasty on 11/30, pharmacy to dose heparin. No anticoagulation PTA. Cardiac cath tentatively planned for Monday.   Heparin level this morning is therapeutic at 0.37 on 1100 units/hour. H&H is stable at 10.1/31.7, plts are wnl. Of note, patient is on DAPT.    Goal of Therapy:  Heparin level 0.3-0.7 units/ml Monitor platelets by anticoagulation protocol: Yes   Plan:  Continue heparin infusion at 1100 units/hr Check anti-Xa level daily while on heparin Continue to monitor H&H and platelets and signs of bleeding    Thank  you,   Eddie Candle, PharmD PGY-1 Pharmacy Resident   Please check amion for clinical pharmacist contact number 05/13/2019,8:26 AM

## 2019-05-13 NOTE — Plan of Care (Signed)
Patient is improved from symptoms experienced on admission. Aware of clinical plan and agreeable to move forward with recommendations Problem: Education: Goal: Knowledge of General Education information will improve Description: Including pain rating scale, medication(s)/side effects and non-pharmacologic comfort measures Outcome: Progressing   Problem: Clinical Measurements: Goal: Cardiovascular complication will be avoided Outcome: Progressing   Problem: Activity: Goal: Risk for activity intolerance will decrease Outcome: Progressing   Problem: Pain Managment: Goal: General experience of comfort will improve Outcome: Progressing

## 2019-05-13 NOTE — Plan of Care (Signed)

## 2019-05-13 NOTE — Progress Notes (Signed)
Cardiology Progress Note  Patient ID: Jadd Gasior MRN: [id number] DOB: 01/29/1961 Date of Encounter: 05/13/2019  Primary Cardiologist: [name], MD  Subjective  No chest pain overnight. Has been mainly in bed. He is concerned about the level of his pain.   ROS:  All other ROS reviewed and negative. Pertinent positives noted in the HPI.     Inpatient Medications  Scheduled Meds: . aspirin  81 mg Oral Daily  . clopidogrel  75 mg Oral Q breakfast  . feeding supplement (ENSURE ENLIVE)  237 mL Oral BID BM  . irbesartan  300 mg Oral Daily  . rosuvastatin  40 mg Oral Daily  . sodium chloride flush  3 mL Intravenous Once  . sodium chloride flush  3 mL Intravenous Q12H   Continuous Infusions: . sodium chloride 100 mL/hr at 05/13/19 0815  . heparin 1,100 Units/hr (05/12/19 1543)   PRN Meds: acetaminophen, nitroGLYCERIN, ondansetron (ZOFRAN) IV   Vital Signs   Vitals:   05/12/19 1949 05/13/19 0446 05/13/19 0455 05/13/19 0722  BP: 119/60 98/72  93/62  Pulse: 64 (!) 57  (!) 55  Resp: 17 16  18   Temp: 98.5 F (36.9 C) 98.1 F (36.7 C)  98.2 F (36.8 C)  TempSrc: Oral Oral  Oral  SpO2: 100% 93%  100%  Weight:   74.8 kg   Height:        Intake/Output Summary (Last 24 hours) at 05/13/2019 0849 Last data filed at 05/13/2019 0501 Gross per 24 hour  Intake 834.22 ml  Output 400 ml  Net 434.22 ml   Last 3 Weights 05/13/2019 05/12/2019 05/11/2019  Weight (lbs) 165 lb 166 lb 1.6 oz 168 lb 4.8 oz  Weight (kg) 74.844 kg 75.342 kg 76.34 kg      Telemetry  Overnight telemetry shows normal sinus rhythm with heart rate in the 60s, which I personally reviewed.   ECG  The most recent ECG shows sinus bradycardia, heart rate 57, LVH noted, impressive T wave inversions in the anterolateral leads, which I personally reviewed.   Physical Exam   Vitals:   05/12/19 1949 05/13/19 0446 05/13/19 0455 05/13/19 0722  BP: 119/60 98/72  93/62  Pulse: 64 (!) 57  (!) 55  Resp:  17 16  18   Temp: 98.5 F (36.9 C) 98.1 F (36.7 C)  98.2 F (36.8 C)  TempSrc: Oral Oral  Oral  SpO2: 100% 93%  100%  Weight:   74.8 kg   Height:         Intake/Output Summary (Last 24 hours) at 05/13/2019 0849 Last data filed at 05/13/2019 0501 Gross per 24 hour  Intake 834.22 ml  Output 400 ml  Net 434.22 ml    Last 3 Weights 05/13/2019 05/12/2019 05/11/2019  Weight (lbs) 165 lb 166 lb 1.6 oz 168 lb 4.8 oz  Weight (kg) 74.844 kg 75.342 kg 76.34 kg    Body mass index is 25.09 kg/m.  General: Well nourished, well developed, in no acute distress Head: Atraumatic, normal size  Eyes: PEERLA, EOMI  Neck: Supple, no JVD Endocrine: No thryomegaly Cardiac: Normal S1, S2; RRR; no murmurs, rubs, or gallops Lungs: Clear to auscultation bilaterally, no wheezing, rhonchi or rales  Abd: Soft, nontender, no hepatomegaly  Ext: No edema, pulses 2+ Musculoskeletal: No deformities, BUE and BLE strength normal and equal Skin: Warm and dry, no rashes   Neuro: Alert and oriented to person, place, time, and situation, CNII-XII grossly intact, no focal deficits  Psych: Normal  mood and affect   Labs  High Sensitivity Troponin:   Recent Labs  Lab 04/28/19 2034 04/28/19 2248 04/29/19 0623 05/11/19 1852 05/11/19 2051  TROPONINIHS 8 28* 46* 18* 5     Cardiac EnzymesNo results for input(s): TROPONINI in the last 168 hours. No results for input(s): TROPIPOC in the last 168 hours.  Chemistry Recent Labs  Lab 05/11/19 1852 05/12/19 0317 05/13/19 0659  NA 140 140 139  K 3.6 3.6 4.1  CL 105 108 104  CO2 25 26 27   GLUCOSE 116* 102* 93  BUN 23* 23* 21*  CREATININE 1.68* 1.56* 1.44*  CALCIUM 9.2 9.0 9.1  GFRNONAA 44* 49* 54*  GFRAA 51* 56* >60  ANIONGAP 10 6 8     Hematology Recent Labs  Lab 05/11/19 1852 05/12/19 0317 05/13/19 0417  WBC 7.5 5.7 5.1  RBC 3.32* 3.25* 3.38*  HGB 10.0* 9.7* 10.1*  HCT 30.5* 29.9* 31.7*  MCV 91.9 92.0 93.8  MCH 30.1 29.8 29.9  MCHC 32.8 32.4 31.9   RDW 12.0 12.2 12.2  PLT 315 274 309   BNPNo results for input(s): BNP, PROBNP in the last 168 hours.  DDimer No results for input(s): DDIMER in the last 168 hours.   Radiology  DG Chest 2 View  Result Date: 05/11/2019 CLINICAL DATA:  Chest pain EXAM: CHEST - 2 VIEW COMPARISON:  April 28, 2019 FINDINGS: The heart size and mediastinal contours are within normal limits. Both lungs are clear. The visualized skeletal structures are unremarkable. IMPRESSION: No active cardiopulmonary disease. Electronically Signed   By: Constance Holster M.D.   On: 05/11/2019 19:41    Cardiac Studies  TTE 04/29/2019  1. Left ventricular ejection fraction, by visual estimation, is 60 to 65%. The left ventricle has normal function. Left ventricular septal wall thickness was mildly increased. Mildly increased left ventricular posterior wall thickness. There is no left  ventricular hypertrophy.  2. Global right ventricle has normal systolic function.The right ventricular size is normal. No increase in right ventricular wall thickness.  3. Left atrial size was mildly dilated.  4. Right atrial size was normal.  5. The mitral valve is normal in structure. Trace mitral valve regurgitation. No evidence of mitral stenosis.  6. The tricuspid valve is normal in structure. Tricuspid valve regurgitation is not demonstrated.  7. The aortic valve was not well visualized. Aortic valve regurgitation is not visualized. No evidence of aortic valve sclerosis or stenosis.  8. The pulmonic valve was normal in structure. Pulmonic valve regurgitation is not visualized.  9. The inferior vena cava is normal in size with greater than 50% respiratory variability, suggesting right atrial pressure of 3 mmHg.  LHC 04/30/2019  Prox LAD to Mid LAD lesion is 30% stenosed.  Dist LAD lesion is 95% stenosed.  Ost 2nd Diag to 2nd Diag lesion is 80% stenosed.  1st Diag lesion is 100% stenosed.  Balloon angioplasty was performed.  Lat  2nd Mrg lesion is 95% stenosed.  1st RPL lesion is 50% stenosed.  Ramus lesion is 95% stenosed.  Ost LM lesion is 20% stenosed.  Prox RCA lesion is 30% stenosed.  Dist RCA lesion is 20% stenosed.  Mid Cx lesion is 70% stenosed.  RPAV lesion is 50% stenosed.    Findings: 1. 3v CAD 2. There is severe 95% restenosis in the 2nd half of the long Ramus stent 3. Mild progression of bifurcation lesion in mid LCX. Now ~ 70% 4. CAD otherwise stable 5. EF 60-65% 6. LV = 110/1  Patient Profile  Bakari Vernet is a 58 y.o. male with reported history of apical variant hypertrophic cardiomyopathy, CAD (extensive three-vessel CAD with recent ISR of ramus intermedius stent status post balloon angioplasty on 04/30/2019), hypertension, CKD 3 who was admitted on 12/11 with concerns for unstable angina.  Assessment & Plan   1.  Unstable angina/three-vessel CAD:  -Recent left heart cath 11/30 with diffuse three-vessel CAD. -He did have 95% ISR of the ramus stent.  He underwent balloon angioplasty then. -He appears to have had no benefit and gets chest pressure and pain with minimal movement. -He reports he cannot walk more than 20 feet without having pain. -Really concerning for possible restenosis of that stent -He carries a questionable diagnosis of apical variant hypertrophic dermopathy.  I am concerned that the T wave inversions are actually new because he did not have those.  Either way chest pain-free on heparin drip. -We will plan for left heart cath tomorrow -We will hydrate him today given his kidney function but it seems to be improving. -He remains on Plavix given his -Heart rate in the 60s and there has been concerns for conductions in the past.  He is not on a beta-blocker.  We will continue to hold beta-blocker therapy. -Blood pressures are relatively soft.  He has no really room to move on any antianginal therapy.  Either way given the significance of his symptoms and rather  concerning alarming symptoms to him, I think he does merit repeat heart catheterization as detailed above -N.p.o. at midnight for left heart cath  2.  Apical variant hypertrophic cardiomyopathy:  -Echo really unimpressive for this diagnosis.  No room for beta-blocker.  Has had transient second-degree AV block in the past and not on these anyway.  Symptoms are more consistent with unstable angina.  3.  Hypertension -Home ARB  4.  CKD stage III -Creatinine is stable.  We will hydrate him today for heart cath tomorrow  FEN -no IVF -diet: general -dvt ppx: heparin drip -code: full   For questions or updates, please contact [facility] Please consult [email or web address] for contact info under    Signed, Gerri Spore T. Flora Lipps, MD [facility]  [facility]  05/13/2019 8:49 AM

## 2019-05-13 NOTE — Progress Notes (Signed)
Notified of decrease in HR.  Reassessed pt. Patient sleeping.  HR 56 at this time.

## 2019-05-14 ENCOUNTER — Encounter: Payer: Self-pay | Admitting: Cardiology

## 2019-05-14 ENCOUNTER — Encounter (HOSPITAL_COMMUNITY)
Admission: EM | Disposition: A | Discharge status: Home / Self Care | Payer: Self-pay | Source: Home / Self Care | Attending: Cardiology

## 2019-05-14 DIAGNOSIS — I10 Essential (primary) hypertension: Secondary | ICD-10-CM

## 2019-05-14 DIAGNOSIS — I25110 Atherosclerotic heart disease of native coronary artery with unstable angina pectoris: Principal | ICD-10-CM

## 2019-05-14 DIAGNOSIS — N182 Chronic kidney disease, stage 2 (mild): Secondary | ICD-10-CM

## 2019-05-14 HISTORY — PX: LEFT HEART CATH AND CORONARY ANGIOGRAPHY: CATH118249

## 2019-05-14 LAB — BASIC METABOLIC PANEL
Anion gap: 9 (ref 5–15)
BUN: 16 mg/dL (ref 6–20)
CO2: 23 mmol/L (ref 22–32)
Calcium: 8.7 mg/dL — ABNORMAL LOW (ref 8.9–10.3)
Chloride: 108 mmol/L (ref 98–111)
Creatinine, Ser: 1.24 mg/dL (ref 0.61–1.24)
GFR calc Af Amer: >60 mL/min (ref >60)
GFR calc non Af Amer: >60 mL/min (ref >60)
Glucose, Bld: 91 mg/dL (ref 70–99)
Potassium: 4 mmol/L (ref 3.5–5.1)
Sodium: 140 mmol/L (ref 135–145)

## 2019-05-14 LAB — CBC
HCT: 29.3 % — ABNORMAL LOW (ref 39.0–52.0)
Hemoglobin: 9.7 g/dL — ABNORMAL LOW (ref 13.0–17.0)
MCH: 30.8 pg (ref 26.0–34.0)
MCHC: 33.1 g/dL (ref 30.0–36.0)
MCV: 93 fL (ref 80.0–100.0)
Platelets: 256 10*3/uL (ref 150–400)
RBC: 3.15 MIL/uL — ABNORMAL LOW (ref 4.22–5.81)
RDW: 11.9 % (ref 11.5–15.5)
WBC: 4.7 10*3/uL (ref 4.0–10.5)
nRBC: 0 % (ref 0.0–0.2)

## 2019-05-14 LAB — HEPARIN LEVEL (UNFRACTIONATED): Heparin Unfractionated: 0.29 IU/mL — ABNORMAL LOW (ref 0.30–0.70)

## 2019-05-14 SURGERY — LEFT HEART CATH AND CORONARY ANGIOGRAPHY
Anesthesia: LOCAL

## 2019-05-14 MED ORDER — SODIUM CHLORIDE 0.9 % IV SOLN: 250.0000 mL | INTRAVENOUS | Status: DC | PRN

## 2019-05-14 MED ORDER — HEPARIN SODIUM (PORCINE) 1000 UNIT/ML IJ SOLN
INTRAMUSCULAR | Status: AC
  Filled 2019-05-14: qty 1

## 2019-05-14 MED ORDER — FENTANYL CITRATE (PF) 100 MCG/2ML IJ SOLN
INTRAMUSCULAR | Status: AC
  Filled 2019-05-14: qty 2

## 2019-05-14 MED ORDER — MIDAZOLAM HCL 2 MG/2ML IJ SOLN
INTRAMUSCULAR | Status: AC
  Filled 2019-05-14: qty 2

## 2019-05-14 MED ORDER — VERAPAMIL HCL 2.5 MG/ML IV SOLN
INTRAVENOUS | Status: AC
  Filled 2019-05-14: qty 2

## 2019-05-14 MED ORDER — SODIUM CHLORIDE 0.9% FLUSH: 3.0000 mL | INTRAVENOUS | Status: DC | PRN

## 2019-05-14 MED ORDER — LIDOCAINE HCL (PF) 1 % IJ SOLN
INTRAMUSCULAR | Status: DC | PRN
  Administered 2019-05-14: 2 mL

## 2019-05-14 MED ORDER — MIDAZOLAM HCL 2 MG/2ML IJ SOLN
INTRAMUSCULAR | Status: DC | PRN
  Administered 2019-05-14: 1 mg via INTRAVENOUS

## 2019-05-14 MED ORDER — HEPARIN (PORCINE) IN NACL 1000-0.9 UT/500ML-% IV SOLN
INTRAVENOUS | Status: DC | PRN
  Administered 2019-05-14: 500 mL

## 2019-05-14 MED ORDER — LIDOCAINE HCL (PF) 1 % IJ SOLN
INTRAMUSCULAR | Status: AC
  Filled 2019-05-14: qty 30

## 2019-05-14 MED ORDER — HEPARIN (PORCINE) IN NACL 1000-0.9 UT/500ML-% IV SOLN
INTRAVENOUS | Status: AC
  Filled 2019-05-14: qty 1000

## 2019-05-14 MED ORDER — ENOXAPARIN SODIUM 40 MG/0.4ML ~~LOC~~ SOLN
40.0000 mg | SUBCUTANEOUS | Status: DC
  Administered 2019-05-15: 40 mg via SUBCUTANEOUS
  Filled 2019-05-14: qty 0.4

## 2019-05-14 MED ORDER — VERAPAMIL HCL 2.5 MG/ML IV SOLN
INTRAVENOUS | Status: DC | PRN
  Administered 2019-05-14: 10 mL via INTRA_ARTERIAL

## 2019-05-14 MED ORDER — FENTANYL CITRATE (PF) 100 MCG/2ML IJ SOLN
INTRAMUSCULAR | Status: DC | PRN
  Administered 2019-05-14: 25 ug via INTRAVENOUS

## 2019-05-14 MED ORDER — IRBESARTAN 300 MG PO TABS
300.0000 mg | ORAL_TABLET | Freq: Every day | ORAL | Status: DC
  Administered 2019-05-15: 300 mg via ORAL
  Filled 2019-05-14: qty 1

## 2019-05-14 MED ORDER — HEPARIN SODIUM (PORCINE) 1000 UNIT/ML IJ SOLN
INTRAMUSCULAR | Status: DC | PRN
  Administered 2019-05-14: 4000 [IU] via INTRAVENOUS

## 2019-05-14 MED ORDER — ISOSORBIDE MONONITRATE ER 30 MG PO TB24
30.0000 mg | ORAL_TABLET | Freq: Every day | ORAL | Status: DC
  Administered 2019-05-14 – 2019-05-15 (×2): 30 mg via ORAL
  Filled 2019-05-14 (×2): qty 1

## 2019-05-14 MED ORDER — SODIUM CHLORIDE 0.9 % WEIGHT BASED INFUSION
1.0000 mL/kg/h | INTRAVENOUS | Status: AC
  Administered 2019-05-14: 1 mL/kg/h via INTRAVENOUS

## 2019-05-14 MED ORDER — SODIUM CHLORIDE 0.9% FLUSH
3.0000 mL | Freq: Two times a day (BID) | INTRAVENOUS | Status: DC
  Administered 2019-05-15: 3 mL via INTRAVENOUS

## 2019-05-14 MED ORDER — IOHEXOL 350 MG/ML SOLN
INTRAVENOUS | Status: DC | PRN
  Administered 2019-05-14: 50 mL

## 2019-05-14 SURGICAL SUPPLY — 9 items
CATH 5FR JL3.5 JR4 ANG PIG MP (CATHETERS) ×2 IMPLANT
DEVICE RAD COMP TR BAND LRG (VASCULAR PRODUCTS) ×2 IMPLANT
GLIDESHEATH SLEND SS 6F .021 (SHEATH) ×2 IMPLANT
GUIDEWIRE INQWIRE 1.5J.035X260 (WIRE) ×1 IMPLANT
INQWIRE 1.5J .035X260CM (WIRE) ×2
KIT HEART LEFT (KITS) ×2 IMPLANT
PACK CARDIAC CATHETERIZATION (CUSTOM PROCEDURE TRAY) ×2 IMPLANT
TRANSDUCER W/STOPCOCK (MISCELLANEOUS) ×2 IMPLANT
TUBING CIL FLEX 10 FLL-RA (TUBING) ×2 IMPLANT

## 2019-05-14 NOTE — Progress Notes (Signed)
Air removed from Right Radial TR band. Patient was able to tolerate well and site is dry with no signs of bleeding. Will leave TR band in place for one hour before placing a dressing. TR syringe at bedside Randell Loop, RN

## 2019-05-14 NOTE — Interval H&P Note (Signed)
History and Physical Interval Note:  05/14/2019 1:34 PM  [name]  has presented today for surgery, with the diagnosis of unstable angina.  The various methods of treatment have been discussed with the patient and family. After consideration of risks, benefits and other options for treatment, the patient has consented to  Procedure(s): LEFT HEART CATH AND CORONARY ANGIOGRAPHY (N/A) as a surgical intervention.  The patient's history has been reviewed, patient examined, no change in status, stable for surgery.  I have reviewed the patient's chart and labs.  Questions were answered to the patient's satisfaction.   Cath Lab Visit (complete for each Cath Lab visit)  Clinical Evaluation Leading to the Procedure:   ACS: Yes.    Non-ACS:    Anginal Classification: CCS III  Anti-ischemic medical therapy: Minimal Therapy (1 class of medications)  Non-Invasive Test Results: No non-invasive testing performed  Prior CABG: No previous CABG        [name] 05/14/2019 1:34 PM

## 2019-05-14 NOTE — H&P (View-Only) (Signed)
 The patient has been seen in conjunction with Lindsay Roberts, NP.  All aspects of care have been considered and discussed. The patient has been personally interviewed, examined, and all clinical data has been reviewed.   Has recurrent unstable angina.  Have reviewed images dating back to 2017.  Unfortunately, much of his coronary disease involves bifurcation.  The 2017 stent in the large ramus crossed over an equally large branch jailing it and resulting in greater than 90% obstruction.  Most recently he has developed in-stent restenosis, diffuse which was angioplastied within the past 3 weeks.  Symptoms have recurred.  We will schedule for repeat cath and possible PCI today.  In looking at images he is also developed progression of the distal circumflex which is also at a bifurcation point with big disparity in branches beyond the bifurcation and the proximal vessel.  Stenting could also jail a side branch.  LAD has moderate disease.  Need to consider FFR on LAD and if significant in the setting of apical hypertrophy, consider surgical revascularization.  If no LAD territory are right coronary territory disease, may need to be redilated and do the best we can with medications.  Additional complicating issues include anemia and chronic kidney impairment.  Baseline hemoglobin in October 2020 was 12.5 prior to the late November PCI.   Progress Note  Patient Name: [name] Date of Encounter: 05/14/2019  Primary Cardiologist: Jaykob Minichiello W Natsha Guidry III, MD   Subjective   Mild chest discomfort this morning.   Inpatient Medications    Scheduled Meds: . aspirin  81 mg Oral Daily  . clopidogrel  75 mg Oral Q breakfast  . feeding supplement (ENSURE ENLIVE)  237 mL Oral BID BM  . irbesartan  300 mg Oral Daily  . rosuvastatin  40 mg Oral Daily  . sodium chloride flush  3 mL Intravenous Once  . sodium chloride flush  3 mL Intravenous Q12H   Continuous Infusions: . sodium chloride    .  sodium chloride 1 mL/kg/hr (05/14/19 0526)  . heparin 1,100 Units/hr (05/13/19 1348)   PRN Meds: sodium chloride, acetaminophen, nitroGLYCERIN, ondansetron (ZOFRAN) IV, sodium chloride flush   Vital Signs    Vitals:   05/13/19 1203 05/13/19 2014 05/13/19 2020 05/14/19 0443  BP: 100/61 116/66  124/75  Pulse: 60 64  62  Resp: 18 18 13 18  Temp: 98.4 F (36.9 C) 98.2 F (36.8 C)  97.8 F (36.6 C)  TempSrc: Oral Oral  Oral  SpO2: 100% 100%  100%  Weight:    75.1 kg  Height:        Intake/Output Summary (Last 24 hours) at 05/14/2019 0915 Last data filed at 05/13/2019 2156 Gross per 24 hour  Intake 1387.86 ml  Output 700 ml  Net 687.86 ml   Last 3 Weights 05/14/2019 05/13/2019 05/12/2019  Weight (lbs) 165 lb 9.6 oz 165 lb 166 lb 1.6 oz  Weight (kg) 75.116 kg 74.844 kg 75.342 kg      Telemetry    SB - Personally Reviewed  ECG    SB with biphasic T wave in anterior leads - Personally Reviewed  Physical Exam  Pleasant younger AAM GEN: No acute distress.   Neck: No JVD Cardiac: RRR, no murmurs, rubs, or gallops.  Respiratory: Clear to auscultation bilaterally. GI: Soft, nontender, non-distended  MS: No edema; No deformity. Neuro:  Nonfocal  Psych: Normal affect   Labs    High Sensitivity Troponin:   Recent Labs  Lab 04/28/19 2034   04/28/19 2248 04/29/19 0623 05/11/19 1852 05/11/19 2051  TROPONINIHS 8 28* 46* 18* 5      Chemistry Recent Labs  Lab 05/12/19 0317 05/13/19 0659 05/14/19 0552  NA 140 139 140  K 3.6 4.1 4.0  CL 108 104 108  CO2 26 27 23   GLUCOSE 102* 93 91  BUN 23* 21* 16  CREATININE 1.56* 1.44* 1.24  CALCIUM 9.0 9.1 8.7*  GFRNONAA 49* 54* >60  GFRAA 56* >60 >60  ANIONGAP 6 8 9      Hematology Recent Labs  Lab 05/12/19 0317 05/13/19 0417 05/14/19 0552  WBC 5.7 5.1 4.7  RBC 3.25* 3.38* 3.15*  HGB 9.7* 10.1* 9.7*  HCT 29.9* 31.7* 29.3*  MCV 92.0 93.8 93.0  MCH 29.8 29.9 30.8  MCHC 32.4 31.9 33.1  RDW 12.2 12.2 11.9  PLT  274 309 256    BNPNo results for input(s): BNP, PROBNP in the last 168 hours.   DDimer No results for input(s): DDIMER in the last 168 hours.   Radiology    No results found.  Cardiac Studies   TTE 04/29/2019 1. Left ventricular ejection fraction, by visual estimation, is 60 to 65%. The left ventricle has normal function. Left ventricular septal wall thickness was mildly increased. Mildly increased left ventricular posterior wall thickness. There is no left  ventricular hypertrophy. 2. Global right ventricle has normal systolic function.The right ventricular size is normal. No increase in right ventricular wall thickness. 3. Left atrial size was mildly dilated. 4. Right atrial size was normal. 5. The mitral valve is normal in structure. Trace mitral valve regurgitation. No evidence of mitral stenosis. 6. The tricuspid valve is normal in structure. Tricuspid valve regurgitation is not demonstrated. 7. The aortic valve was not well visualized. Aortic valve regurgitation is not visualized. No evidence of aortic valve sclerosis or stenosis. 8. The pulmonic valve was normal in structure. Pulmonic valve regurgitation is not visualized. 9. The inferior vena cava is normal in size with greater than 50% respiratory variability, suggesting right atrial pressure of 3 mmHg.  LHC 04/30/2019  Prox LAD to Mid LAD lesion is 30% stenosed.  Dist LAD lesion is 95% stenosed.  Ost 2nd Diag to 2nd Diag lesion is 80% stenosed.  1st Diag lesion is 100% stenosed.  Balloon angioplasty was performed.  Lat 2nd Mrg lesion is 95% stenosed.  1st RPL lesion is 50% stenosed.  Ramus lesion is 95% stenosed.  Ost LM lesion is 20% stenosed.  Prox RCA lesion is 30% stenosed.  Dist RCA lesion is 20% stenosed.  Mid Cx lesion is 70% stenosed.  RPAV lesion is 50% stenosed.   Findings: 1. 3v CAD 2. There is severe 95% restenosis in the 2nd half of the long Ramus stent 3. Mild progression of  bifurcation lesion in mid LCX. Now ~ 70% 4. CAD otherwise stable 5. EF 60-65% 6. LV = 110/1   Patient Profile     58 y.o. male with reported history of apical variant hypertrophic cardiomyopathy, CAD (extensive three-vessel CAD with recent ISR of ramus intermedius stent status post balloon angioplasty on 04/30/2019), hypertension, CKD 3 who was admitted on 12/11 with concerns for unstable angina.   Assessment & Plan    1.  Unstable angina/three-vessel CAD: Recent left heart cath 11/30 with diffuse three-vessel CAD with ISR in ramus stent. Has been maintained on ASA, plavix. Reports compliance. Does have new T wave changes in anterior leads. Planned for cardiac cath today. -- on IV heparin. Blood pressures have  been soft in the past preventing further titration of therapy. May be able to add on Imdur given concomitant disease.   2.  Apical variant hypertrophic cardiomyopathy:  No room for beta-blocker.  Has had transient second-degree AV block in the past and not on these anyway.  Symptoms are more consistent with unstable angina.  3.  Hypertension -hold ARB today  4.  CKD stage III -Creatinine is stable.    For questions or updates, please contact [facility] Please consult [email or web address] for contact info under        Signed, Laverda Page, NP  05/14/2019, 9:15 AM

## 2019-05-14 NOTE — Progress Notes (Signed)
ANTICOAGULATION CONSULT NOTE - Follow Up Consult  Pharmacy Consult for heparin Indication: chest pain/ACS  Allergies  Allergen Reactions  . Lisinopril Swelling    Facial swelling    Patient Measurements: Height: 5\' 8"  (172.7 cm) Weight: 165 lb 9.6 oz (75.1 kg) IBW/kg (Calculated) : 68.4 Heparin Dosing Weight: 76 kg  Vital Signs: Temp: 97.8 F (36.6 C) (12/14 0443) Temp Source: Oral (12/14 0443) BP: 124/75 (12/14 0443) Pulse Rate: 62 (12/14 0443)  Labs: Recent Labs    05/11/19 1852 05/11/19 1852 05/11/19 2051 05/12/19 0317 05/12/19 1056 05/13/19 0417 05/13/19 0659 05/14/19 0552  HGB 10.0*  --   --  9.7*  --  10.1*  --  9.7*  HCT 30.5*  --   --  29.9*  --  31.7*  --  29.3*  PLT 315  --   --  274  --  309  --  256  LABPROT  --   --   --  13.1  --   --   --   --   INR  --   --   --  1.0  --   --   --   --   HEPARINUNFRC  --    < >  --  0.21* 0.45 0.37  --  0.29*  CREATININE 1.68*  --   --  1.56*  --   --  1.44* 1.24  TROPONINIHS 18*  --  5  --   --   --   --   --    < > = values in this interval not displayed.    Estimated Creatinine Clearance: 63.6 mL/min (by C-G formula based on SCr of 1.24 mg/dL).   Medications:  Scheduled:  . aspirin  81 mg Oral Daily  . clopidogrel  75 mg Oral Q breakfast  . feeding supplement (ENSURE ENLIVE)  237 mL Oral BID BM  . irbesartan  300 mg Oral Daily  . rosuvastatin  40 mg Oral Daily  . sodium chloride flush  3 mL Intravenous Once  . sodium chloride flush  3 mL Intravenous Q12H    Assessment: 67 yom presenting with chest pain and CAD s/p recent angioplasty on 11/30, pharmacy to dose heparin. No anticoagulation PTA. Cardiac cath tentatively planned for Monday.   Heparin level this morning is at low end of the range 0.29 on 1100 units/hour. H&H is low  stable, plts are wnl. Of note, patient is on DAPT.    Goal of Therapy:  Heparin level 0.3-0.7 units/ml Monitor platelets by anticoagulation protocol: Yes   Plan:   Continue heparin infusion at 1100 units/hr Check anti-Xa level daily while on heparin Continue to monitor H&H and platelets and signs of bleeding  Follow up after cath  [place].D. CPP, BCPS Clinical Pharmacist [phone] 05/14/2019 9:11 AM     Please check amion for clinical pharmacist contact number 05/14/2019,9:09 AM

## 2019-05-14 NOTE — Plan of Care (Signed)
  Problem: Clinical Measurements: Goal: Ability to maintain clinical measurements within normal limits will improve Outcome: Progressing Goal: Cardiovascular complication will be avoided Outcome: Progressing   Problem: Activity: Goal: Risk for activity intolerance will decrease Outcome: Progressing   Problem: Nutrition: Goal: Adequate nutrition will be maintained Outcome: Progressing   Problem: Coping: Goal: Level of anxiety will decrease Outcome: Progressing Note: Patient has verbalized understanding of procedures and has expressed he is feeling better post heart cath. Is eager to return to work

## 2019-05-14 NOTE — Progress Notes (Signed)
Patient picked up by Cath Lab. Cardiac monitor placed on standby.

## 2019-05-14 NOTE — Progress Notes (Addendum)
The patient has been seen in conjunction with Laverda Page, NP.  All aspects of care have been considered and discussed. The patient has been personally interviewed, examined, and all clinical data has been reviewed.   Has recurrent unstable angina.  Have reviewed images dating back to 2017.  Unfortunately, much of his coronary disease involves bifurcation.  The 2017 stent in the large ramus crossed over an equally large branch jailing it and resulting in greater than 90% obstruction.  Most recently he has developed in-stent restenosis, diffuse which was angioplastied within the past 3 weeks.  Symptoms have recurred.  We will schedule for repeat cath and possible PCI today.  In looking at images he is also developed progression of the distal circumflex which is also at a bifurcation point with big disparity in branches beyond the bifurcation and the proximal vessel.  Stenting could also jail a side branch.  LAD has moderate disease.  Need to consider FFR on LAD and if significant in the setting of apical hypertrophy, consider surgical revascularization.  If no LAD territory are right coronary territory disease, may need to be redilated and do the best we can with medications.  Additional complicating issues include anemia and chronic kidney impairment.  Baseline hemoglobin in October 2020 was 12.5 prior to the late November PCI.   Progress Note  Patient Name: [name] Date of Encounter: 05/14/2019  Primary Cardiologist: Lyn Records III, MD   Subjective   Mild chest discomfort this morning.   Inpatient Medications    Scheduled Meds: . aspirin  81 mg Oral Daily  . clopidogrel  75 mg Oral Q breakfast  . feeding supplement (ENSURE ENLIVE)  237 mL Oral BID BM  . irbesartan  300 mg Oral Daily  . rosuvastatin  40 mg Oral Daily  . sodium chloride flush  3 mL Intravenous Once  . sodium chloride flush  3 mL Intravenous Q12H   Continuous Infusions: . sodium chloride    .  sodium chloride 1 mL/kg/hr (05/14/19 0526)  . heparin 1,100 Units/hr (05/13/19 1348)   PRN Meds: sodium chloride, acetaminophen, nitroGLYCERIN, ondansetron (ZOFRAN) IV, sodium chloride flush   Vital Signs    Vitals:   05/13/19 1203 05/13/19 2014 05/13/19 2020 05/14/19 0443  BP: 100/61 116/66  124/75  Pulse: 60 64  62  Resp: 18 18 13 18   Temp: 98.4 F (36.9 C) 98.2 F (36.8 C)  97.8 F (36.6 C)  TempSrc: Oral Oral  Oral  SpO2: 100% 100%  100%  Weight:    75.1 kg  Height:        Intake/Output Summary (Last 24 hours) at 05/14/2019 0915 Last data filed at 05/13/2019 2156 Gross per 24 hour  Intake 1387.86 ml  Output 700 ml  Net 687.86 ml   Last 3 Weights 05/14/2019 05/13/2019 05/12/2019  Weight (lbs) 165 lb 9.6 oz 165 lb 166 lb 1.6 oz  Weight (kg) 75.116 kg 74.844 kg 75.342 kg      Telemetry    SB - Personally Reviewed  ECG    SB with biphasic T wave in anterior leads - Personally Reviewed  Physical Exam  Pleasant younger AAM GEN: No acute distress.   Neck: No JVD Cardiac: RRR, no murmurs, rubs, or gallops.  Respiratory: Clear to auscultation bilaterally. GI: Soft, nontender, non-distended  MS: No edema; No deformity. Neuro:  Nonfocal  Psych: Normal affect   Labs    High Sensitivity Troponin:   Recent Labs  Lab 04/28/19 2034  04/28/19 2248 04/29/19 0623 05/11/19 1852 05/11/19 2051  TROPONINIHS 8 28* 46* 18* 5      Chemistry Recent Labs  Lab 05/12/19 0317 05/13/19 0659 05/14/19 0552  NA 140 139 140  K 3.6 4.1 4.0  CL 108 104 108  CO2 26 27 23   GLUCOSE 102* 93 91  BUN 23* 21* 16  CREATININE 1.56* 1.44* 1.24  CALCIUM 9.0 9.1 8.7*  GFRNONAA 49* 54* >60  GFRAA 56* >60 >60  ANIONGAP 6 8 9      Hematology Recent Labs  Lab 05/12/19 0317 05/13/19 0417 05/14/19 0552  WBC 5.7 5.1 4.7  RBC 3.25* 3.38* 3.15*  HGB 9.7* 10.1* 9.7*  HCT 29.9* 31.7* 29.3*  MCV 92.0 93.8 93.0  MCH 29.8 29.9 30.8  MCHC 32.4 31.9 33.1  RDW 12.2 12.2 11.9  PLT  274 309 256    BNPNo results for input(s): BNP, PROBNP in the last 168 hours.   DDimer No results for input(s): DDIMER in the last 168 hours.   Radiology    No results found.  Cardiac Studies   TTE 04/29/2019 1. Left ventricular ejection fraction, by visual estimation, is 60 to 65%. The left ventricle has normal function. Left ventricular septal wall thickness was mildly increased. Mildly increased left ventricular posterior wall thickness. There is no left  ventricular hypertrophy. 2. Global right ventricle has normal systolic function.The right ventricular size is normal. No increase in right ventricular wall thickness. 3. Left atrial size was mildly dilated. 4. Right atrial size was normal. 5. The mitral valve is normal in structure. Trace mitral valve regurgitation. No evidence of mitral stenosis. 6. The tricuspid valve is normal in structure. Tricuspid valve regurgitation is not demonstrated. 7. The aortic valve was not well visualized. Aortic valve regurgitation is not visualized. No evidence of aortic valve sclerosis or stenosis. 8. The pulmonic valve was normal in structure. Pulmonic valve regurgitation is not visualized. 9. The inferior vena cava is normal in size with greater than 50% respiratory variability, suggesting right atrial pressure of 3 mmHg.  LHC 04/30/2019  Prox LAD to Mid LAD lesion is 30% stenosed.  Dist LAD lesion is 95% stenosed.  Ost 2nd Diag to 2nd Diag lesion is 80% stenosed.  1st Diag lesion is 100% stenosed.  Balloon angioplasty was performed.  Lat 2nd Mrg lesion is 95% stenosed.  1st RPL lesion is 50% stenosed.  Ramus lesion is 95% stenosed.  Ost LM lesion is 20% stenosed.  Prox RCA lesion is 30% stenosed.  Dist RCA lesion is 20% stenosed.  Mid Cx lesion is 70% stenosed.  RPAV lesion is 50% stenosed.   Findings: 1. 3v CAD 2. There is severe 95% restenosis in the 2nd half of the long Ramus stent 3. Mild progression of  bifurcation lesion in mid LCX. Now ~ 70% 4. CAD otherwise stable 5. EF 60-65% 6. LV = 110/1   Patient Profile     58 y.o. male with reported history of apical variant hypertrophic cardiomyopathy, CAD (extensive three-vessel CAD with recent ISR of ramus intermedius stent status post balloon angioplasty on 04/30/2019), hypertension, CKD 3 who was admitted on 12/11 with concerns for unstable angina.   Assessment & Plan    1.  Unstable angina/three-vessel CAD: Recent left heart cath 11/30 with diffuse three-vessel CAD with ISR in ramus stent. Has been maintained on ASA, plavix. Reports compliance. Does have new T wave changes in anterior leads. Planned for cardiac cath today. -- on IV heparin. Blood pressures have  been soft in the past preventing further titration of therapy. May be able to add on Imdur given concomitant disease.   2.  Apical variant hypertrophic cardiomyopathy:  No room for beta-blocker.  Has had transient second-degree AV block in the past and not on these anyway.  Symptoms are more consistent with unstable angina.  3.  Hypertension -hold ARB today  4.  CKD stage III -Creatinine is stable.    For questions or updates, please contact [facility] Please consult [email or web address] for contact info under        Signed, Laverda Page, NP  05/14/2019, 9:15 AM

## 2019-05-15 ENCOUNTER — Other Ambulatory Visit: Payer: Self-pay

## 2019-05-15 ENCOUNTER — Other Ambulatory Visit: Payer: Self-pay | Admitting: Cardiology

## 2019-05-15 DIAGNOSIS — I422 Other hypertrophic cardiomyopathy: Secondary | ICD-10-CM

## 2019-05-15 LAB — BASIC METABOLIC PANEL
Anion gap: 9 (ref 5–15)
BUN: 16 mg/dL (ref 6–20)
CO2: 25 mmol/L (ref 22–32)
Calcium: 9 mg/dL (ref 8.9–10.3)
Chloride: 106 mmol/L (ref 98–111)
Creatinine, Ser: 1.5 mg/dL — ABNORMAL HIGH (ref 0.61–1.24)
GFR calc Af Amer: 59 mL/min — ABNORMAL LOW (ref >60)
GFR calc non Af Amer: 51 mL/min — ABNORMAL LOW (ref >60)
Glucose, Bld: 89 mg/dL (ref 70–99)
Potassium: 4 mmol/L (ref 3.5–5.1)
Sodium: 140 mmol/L (ref 135–145)

## 2019-05-15 LAB — CBC
HCT: 29.2 % — ABNORMAL LOW (ref 39.0–52.0)
Hemoglobin: 9.5 g/dL — ABNORMAL LOW (ref 13.0–17.0)
MCH: 30.2 pg (ref 26.0–34.0)
MCHC: 32.5 g/dL (ref 30.0–36.0)
MCV: 92.7 fL (ref 80.0–100.0)
Platelets: 271 10*3/uL (ref 150–400)
RBC: 3.15 MIL/uL — ABNORMAL LOW (ref 4.22–5.81)
RDW: 11.9 % (ref 11.5–15.5)
WBC: 4.9 10*3/uL (ref 4.0–10.5)
nRBC: 0 % (ref 0.0–0.2)

## 2019-05-15 MED ORDER — AMLODIPINE BESYLATE 5 MG PO TABS: 5.0000 mg | ORAL_TABLET | Freq: Every day | ORAL | 1 refills | Status: DC

## 2019-05-15 NOTE — Plan of Care (Signed)

## 2019-05-15 NOTE — Progress Notes (Signed)
Progress Note  Patient Name: [name] Date of Encounter: 05/15/2019  Primary Cardiologist: [name], MD   Subjective   He is disappointed that anatomy does not allow intervention that may help improve angina.  We spent significant time discussing location and anatomic findings that are not appropriate for PCI.  Discussed the possibility that relatively low blood pressures for him could be contributing to symptoms.  Amlodipine and diuretic therapy have been discontinued.  As an outpatient has been treated with Lozol, Edarbi,Amlodipine, hydralazine, simultaneously and at home has been recording blood pressures in the 90 to 100 mmHg range.  Hydralazine, indapamide, amlodipine, and Edarbi (azilsartan) have been discontinued.  Inpatient Medications    Scheduled Meds: . aspirin  81 mg Oral Daily  . clopidogrel  75 mg Oral Q breakfast  . enoxaparin (LOVENOX) injection  40 mg Subcutaneous Q24H  . feeding supplement (ENSURE ENLIVE)  237 mL Oral BID BM  . irbesartan  300 mg Oral Daily  . rosuvastatin  40 mg Oral Daily  . sodium chloride flush  3 mL Intravenous Once  . sodium chloride flush  3 mL Intravenous Q12H  . sodium chloride flush  3 mL Intravenous Q12H   Continuous Infusions: . sodium chloride     PRN Meds: sodium chloride, acetaminophen, nitroGLYCERIN, ondansetron (ZOFRAN) IV, sodium chloride flush   Vital Signs    Vitals:   05/14/19 1702 05/14/19 1800 05/14/19 1946 05/15/19 0400  BP: 117/78 129/74 123/67 125/74  Pulse: (!) 55 (!) 51 64 63  Resp:   18 18  Temp:   98.3 F (36.8 C) 98.3 F (36.8 C)  TempSrc:   Oral Oral  SpO2: 100%  100% 99%  Weight:    76.9 kg  Height:        Intake/Output Summary (Last 24 hours) at 05/15/2019 1118 Last data filed at 05/15/2019 0915 Gross per 24 hour  Intake 897.2 ml  Output 600 ml  Net 297.2 ml   Last 3 Weights 05/15/2019 05/14/2019 05/13/2019  Weight (lbs) 169 lb 9.6 oz 165 lb 9.6 oz 165 lb  Weight (kg)  76.93 kg 75.116 kg 74.844 kg      Telemetry    Sinus rhythm and sinus bradycardia- Personally Reviewed  ECG    Sinus bradycardia 58 bpm, prominent voltage, lateral precordial symmetrical T wave inversion.  When compared to historical EKGs dating back to 2011, the repolarization abnormality appears less severe on today's EKG compared with prior where the T wave abnormality at times has extended into the lateral limb leads and inferior leads.- Personally Reviewed  Physical Exam  Relatively young in appearance GEN: No acute distress.   Neck: No JVD Cardiac: RRR, no murmurs, rubs, or gallops.  Respiratory: Clear to auscultation bilaterally. GI: Soft, nontender, non-distended  MS: No edema; No deformity. Neuro:  Nonfocal  Psych: Dull affect  Labs    High Sensitivity Troponin:   Recent Labs  Lab 04/28/19 2034 04/28/19 2248 04/29/19 0623 05/11/19 1852 05/11/19 2051  TROPONINIHS 8 28* 46* 18* 5      Chemistry Recent Labs  Lab 05/13/19 0659 05/14/19 0552 05/15/19 0550  NA 139 140 140  K 4.1 4.0 4.0  CL 104 108 106  CO2 27 23 25   GLUCOSE 93 91 89  BUN 21* 16 16  CREATININE 1.44* 1.24 1.50*  CALCIUM 9.1 8.7* 9.0  GFRNONAA 54* >60 51*  GFRAA >60 >60 59*  ANIONGAP 8 9 9      Hematology Recent Labs  Lab 05/13/19 0417 05/14/19 0552 05/15/19 0550  WBC 5.1 4.7 4.9  RBC 3.38* 3.15* 3.15*  HGB 10.1* 9.7* 9.5*  HCT 31.7* 29.3* 29.2*  MCV 93.8 93.0 92.7  MCH 29.9 30.8 30.2  MCHC 31.9 33.1 32.5  RDW 12.2 11.9 11.9  PLT 309 256 271    BNPNo results for input(s): BNP, PROBNP in the last 168 hours.   DDimer No results for input(s): DDIMER in the last 168 hours.   Radiology    CARDIAC CATHETERIZATION  Result Date: 05/14/2019  Mid LAD lesion is 30% stenosed.  Dist LAD lesion is 90% stenosed.  1st Diag lesion is 100% stenosed.  Ramus lesion is 25% stenosed.  Lat Ramus lesion is 90% stenosed.  Mid Cx lesion is 70% stenosed.  Lat 2nd Mrg lesion is 99% stenosed.   Prox RCA to Mid RCA lesion is 20% stenosed.  RPAV lesion is 35% stenosed.  LV end diastolic pressure is normal.  1. CAD. Patient has multiple obstructive lesions in small branches including distal LAD, first diagonal, sub-branch of the ramus intermediate, and sub-branch of OM2. The ramus intermediate stent is still widely patent at prior angioplasty site. There is a moderate stenosis in the mid to distal LCx that involves a bifurcation. 2. Normal LVEDP Plan: I do not see any good targets for PCI and feel his symptoms are consistent with his small vessel disease. I would recommend aggressive anti-anginal medical therapy.   Cardiac Studies   The digital images from yesterday's angiography was reviewed.  Agree with findings and recommendations by Dr. [place].  Patient Profile     58 y.o. male of apical variant hypertrophic cardiomyopathy, CAD involving ramus intermedius(distal and branch CAD with ramus intermedius stent 2017 and recent ISR of ramus intermedius stent status post balloon angioplasty on 04/30/2019), apical LAD, and distal circumflex, essential hypertension with recent low blood pressures, CKD 3 who was admitted on 12/11 with concerns for unstable angina.  Repeat cardiac catheterization 05/14/2019 unchanged from the late November images.  Assessment & Plan    1. Angina pectoris, related to sidebranch and distal disease plus or minus impact of apical hypertrophy with increase blood flow requirement.  With no change in anatomy, adjustment of medications will be tried.  Assuming apical hypertrophy, long-acting nitrates have been discontinued.  These can be used if episodes of pain are not relieved by rest.  ARB therapy will be the antihypertensive and preventive therapy of choice.  Have been unable to add beta-blocker therapy due to intrinsic slow heart rates.  Same is true of the negatively inotropic calcium channel blockers.  Procardia has been discontinued because of concern that blood  pressures have been too low and could be paradoxically increasing episodes of angina.  The plan is to accept blood pressures in the 130 to 145 mmHg systolic range.  Continue to work on risk factor modification with Repatha and max dose rosuvastatin.  Target LDL less than 70.  Enroll in phase 2 cardiac rehab.  This is an essential activity to better understand when he has angina and to be able to assess vital signs at those times.  He will have an outpatient cardiac MRI to confirm or refute the diagnosis of apical hypertrophic cardiomyopathy.  If he has apical hypertrophy his presence may be a source of pain related to relative ischemia in the setting of small branch CAD. 2. Possible underlying apical hypertrophic cardiomyopathy: MRI will be performed as outpatient 3. Essential hypertension: Restarting over with a management strategy.  He was clearly on too much medication and at times had hypotension as an outpatient.  Perhaps with slightly higher blood pressures, perfusion can be improved and symptoms decreased.   Ambulate.  If he tolerates well, can be discharged later today.  Cardiac rehab phase 2 needs to be set up.  Sublingual nitroglycerin can be used if prolonged pain.  We are attempting to allow his blood pressure to be higher than previous.  For questions or updates, please contact [facility] Please consult [email or web address] for contact info under        Signed, [name], MD  05/15/2019, 11:18 AM

## 2019-05-15 NOTE — TOC Transition Note (Signed)
Transition of Care [facility]) - CM/SW Discharge Note   Patient Details  Name: [name] MRN: [id number] Date of Birth: Mar 18, 1961  [facility] CM/SW Contact:  Zenon Mayo, RN Phone Number: 05/15/2019, 1:26 PM   Clinical Narrative:    Patient for dc today, he has a follow up apt at the [facility] clinic.  His medications  Cost is 17.92 , he states he can afford his medications.  He has transportation at [employer]. He has no other needs.   Final next level of care: Home/Self Care Barriers to Discharge: No Barriers Identified   Patient Goals and CMS Choice        Discharge Placement                       Discharge Plan and Services                          HH Arranged: NA          Social Determinants of Health (SDOH) Interventions     Readmission Risk Interventions No flowsheet data found.

## 2019-05-15 NOTE — Discharge Summary (Addendum)
The patient has been seen in conjunction with [name] Page, NP. All aspects of care have been considered and discussed. The patient has been personally interviewed, examined, and all clinical data has been reviewed.  Please see note from earlier today. Plan to MRI and refer to Cardiac rehab. Decrease intensity of medication regimen. Stopped hydralazine, may need to decrease diuretic, and continues with BP < 100 mmHg systolic, will need to DC amlodipine.   Discharge Summary    Patient ID: [name],  MRN: [id number], DOB/AGE: [birth year] 58 y.o.  Admit date: 05/11/2019 Discharge date: 05/15/2019  Primary Care Provider: Etta Grandchild Primary Cardiologist: [name], MD  Discharge Diagnoses    Principal Problem:   Unstable angina [facility]) Active Problems:   Essential hypertension   GERD (gastroesophageal reflux disease)   Apical variant hypertrophic cardiomyopathy (HCC)   Chronic renal insufficiency, stage II (mild)   Allergies Allergies  Allergen Reactions   Lisinopril Swelling    Facial swelling    Diagnostic Studies/Procedures    Cath: 05/14/19  Mid LAD lesion is 30% stenosed. Dist LAD lesion is 90% stenosed. 1st Diag lesion is 100% stenosed. Ramus lesion is 25% stenosed. Lat Ramus lesion is 90% stenosed. Mid Cx lesion is 70% stenosed. Lat 2nd Mrg lesion is 99% stenosed. Prox RCA to Mid RCA lesion is 20% stenosed. RPAV lesion is 35% stenosed. LV end diastolic pressure is normal.   1. CAD. Patient has multiple obstructive lesions in small branches including distal LAD, first diagonal, sub-branch of the ramus intermediate, and sub-branch of OM2. The ramus intermediate stent is still widely patent at prior angioplasty site. There is a moderate stenosis in the mid to distal LCx that involves a bifurcation. 2. Normal LVEDP   Plan: I do not see any good targets for PCI and feel his symptoms are consistent with his small vessel disease. I would  recommend aggressive anti-anginal medical therapy.   Diagnostic Dominance: Right    _____________   History of Present Illness     [name] is a 58 y.o. male with a history of CAD (recent cutting balloon angioplasty of the ramus intermedius on 04/30/2019), apical variant hypertrophic cardiomyopathy, GERD, chronic renal insufficiency, chronic back pain, hyperlipidemia (on rosuvastatin and Repatha), hypertension, tobacco abuse, prior CVA and migraine headaches who presented to the hospital with complaints of chest pain. The patient reported acute onset of chest pain the day of admission. It progressively got worse such that at one point it was 10 out of 10 in intensity. The pain spread across the precordium and also radiated down the left arm. He does admit to significant stress related to his job. He works as a Nutritional therapist. He took 1 sublingual nitroglycerin which did not help with the chest pain. The discomfort finally improved once he received morphine and aspirin from EMS. He denied any associated shortness of breath, diaphoresis, nausea or vomiting. He stated that he has exertional fatigue. He barely walks 10-20 steps before having to stop because he feels tired. He does admit to intermittent palpitations as well. He denied orthopnea, paroxysmal nocturnal dyspnea or lower extremity edema. There have been no episodes of presyncope or syncope. Of note, the patient was admitted 2 weeks ago with similar symptoms. At that time he underwent angioplasty for restenosis in the ramus intermedius branch.   In the ED his labs were as follows: Potassium 3.6, BUN 23, creatinine 1.68, high-sensitivity troponin 18, hematocrit 30.5 and platelets 315. The ECG revealed voltage  criteria for LVH and T wave inversions in the inferolateral leads. The chest x-ray was unremarkable. He was started on IV heparin with improvement in his chest pain. Planned for cardiac cath.     Hospital Course     Underwent cardiac cath  noted above with known multiple obstructive lesions in small branches including the distal LAD, 1st diag, sub branch of RI and OM2. Previously ramus intermediate stent widely patent from prior angioplasty. No targets felt appropriate for PCI. Symptoms were felt to be related to small vessel disease. He was started on Imdur post cath but developed significant headache, therefore was stopped. Also plan to stop home hydralazine, and reduce amlodipine from 10->5mg  daily for allow for more blood pressure room. He has had several episodes of dizziness as an outpatient.  Plan for outpatient cardiac MRI to further assess hypertrophic cardiomyopathy.   General: Well developed, well nourished, male appearing in no acute distress. Head: Normocephalic, atraumatic.  Neck: Supple without bruits, JVD. Lungs:  Resp regular and unlabored, CTA. Heart: RRR, S1, S2, no S3, S4, or murmur; no rub. Abdomen: Soft, non-tender, non-distended with normoactive bowel sounds. No hepatomegaly. No rebound/guarding. No obvious abdominal masses. Extremities: No clubbing, cyanosis, edema. Distal pedal pulses are 2+ bilaterally. Right radial cath site stable without bruising or hematoma Neuro: Alert and oriented X 3. Moves all extremities spontaneously. Psych: Normal affect.  [name] was seen by Dr. Katrinka Blazing and determined stable for discharge home. Follow up in the office has been arranged. Medications are listed below.   _____________  Discharge Vitals Blood pressure 117/74, pulse (!) 54, temperature 98 F (36.7 C), temperature source Oral, resp. rate 18, height 5\' 8"  (1.727 m), weight 76.9 kg, SpO2 100 %.  Filed Weights   05/13/19 0455 05/14/19 0443 05/15/19 0400  Weight: 74.8 kg 75.1 kg 76.9 kg    Labs & Radiologic Studies    CBC Recent Labs    05/14/19 0552 05/15/19 0550  WBC 4.7 4.9  HGB 9.7* 9.5*  HCT 29.3* 29.2*  MCV 93.0 92.7  PLT 256 271   Basic Metabolic Panel Recent Labs    96/29/52 0552  05/15/19 0550  NA 140 140  K 4.0 4.0  CL 108 106  CO2 23 25  GLUCOSE 91 89  BUN 16 16  CREATININE 1.24 1.50*  CALCIUM 8.7* 9.0   Liver Function Tests No results for input(s): AST, ALT, ALKPHOS, BILITOT, PROT, ALBUMIN in the last 72 hours. No results for input(s): LIPASE, AMYLASE in the last 72 hours. Cardiac Enzymes No results for input(s): CKTOTAL, CKMB, CKMBINDEX, TROPONINI in the last 72 hours. BNP Invalid input(s): POCBNP D-Dimer No results for input(s): DDIMER in the last 72 hours. Hemoglobin A1C No results for input(s): HGBA1C in the last 72 hours. Fasting Lipid Panel No results for input(s): CHOL, HDL, LDLCALC, TRIG, CHOLHDL, LDLDIRECT in the last 72 hours. Thyroid Function Tests No results for input(s): TSH, T4TOTAL, T3FREE, THYROIDAB in the last 72 hours.  Invalid input(s): FREET3 _____________  DG Chest 2 View  Result Date: 05/11/2019 CLINICAL DATA:  Chest pain EXAM: CHEST - 2 VIEW COMPARISON:  April 28, 2019 FINDINGS: The heart size and mediastinal contours are within normal limits. Both lungs are clear. The visualized skeletal structures are unremarkable. IMPRESSION: No active cardiopulmonary disease. Electronically Signed   By: Katherine Mantle M.D.   On: 05/11/2019 19:41   CT Angio Chest PE W and/or Wo Contrast  Result Date: 04/28/2019 CLINICAL DATA:  PE suspected, intermediate probability, positive  D-dimer, gradual onset left chest pain EXAM: CT ANGIOGRAPHY CHEST WITH CONTRAST TECHNIQUE: Multidetector CT imaging of the chest was performed using the standard protocol during bolus administration of intravenous contrast. Multiplanar CT image reconstructions and MIPs were obtained to evaluate the vascular anatomy. CONTRAST:  117mL OMNIPAQUE IOHEXOL 350 MG/ML SOLN COMPARISON:  Chest radiograph 04/28/2019 FINDINGS: Cardiovascular: Satisfactory opacification the pulmonary arteries to the segmental level. No pulmonary artery filling defects are identified. Central  pulmonary arteries are normal caliber. Normal heart size. No pericardial effusion. Coronary artery calcification is seen. Thoracic aorta is normal caliber with minimal atherosclerotic plaque. No acute aortic abnormality. The left vertebral artery arises directly from the aortic arch. Mediastinum/Nodes: No enlarged mediastinal, hilar, or axillary lymph nodes. Thyroid gland and thoracic inlet are unremarkable. Posterior bowing of the trachea compatible with imaging during exhalation. High attenuation material within the thoracic esophagus and proximal stomach. Lungs/Pleura: Atelectatic changes posteriorly. No consolidation, features of edema, pneumothorax, or effusion. No suspicious pulmonary nodules or masses. Upper Abdomen: Stomach distended with ingested material including more high attenuation material. No acute abnormalities present in the visualized portions of the upper abdomen. Atherosclerotic calcifications of the imaged abdominal aorta as well. Musculoskeletal: Multilevel degenerative changes are present in the imaged portions of the spine. No acute osseous abnormality or suspicious osseous lesion. Review of the MIP images confirms the above findings. IMPRESSION: 1. No evidence for pulmonary embolus. 2. No acute intrathoracic process. 3. High attenuation material within the thoracic esophagus and proximal stomach, may represent ingested material with some trace reflux. 4. Aortic Atherosclerosis (ICD10-I70.0). Electronically Signed   By: Lovena Le M.D.   On: 04/28/2019 23:59   CARDIAC CATHETERIZATION  Result Date: 05/14/2019  Mid LAD lesion is 30% stenosed.  Dist LAD lesion is 90% stenosed.  1st Diag lesion is 100% stenosed.  Ramus lesion is 25% stenosed.  Lat Ramus lesion is 90% stenosed.  Mid Cx lesion is 70% stenosed.  Lat 2nd Mrg lesion is 99% stenosed.  Prox RCA to Mid RCA lesion is 20% stenosed.  RPAV lesion is 35% stenosed.  LV end diastolic pressure is normal.  1. CAD. Patient has  multiple obstructive lesions in small branches including distal LAD, first diagonal, sub-branch of the ramus intermediate, and sub-branch of OM2. The ramus intermediate stent is still widely patent at prior angioplasty site. There is a moderate stenosis in the mid to distal LCx that involves a bifurcation. 2. Normal LVEDP Plan: I do not see any good targets for PCI and feel his symptoms are consistent with his small vessel disease. I would recommend aggressive anti-anginal medical therapy.  CARDIAC CATHETERIZATION  Result Date: 04/30/2019  Dist LAD lesion is 90% stenosed.  1st Diag lesion is 100% stenosed.  Ramus lesion is 90% stenosed.  Post intervention, there is a 25% residual stenosis.  1. Successful cutting balloon angioplasty of the ramus intermediate artery for in stent restenosis. Plan; DAPT for one year for ACS indication. If patient has  restenosis again could consider repeat stenting.   CARDIAC CATHETERIZATION  Result Date: 04/30/2019  Prox LAD to Mid LAD lesion is 30% stenosed.  Dist LAD lesion is 95% stenosed.  Ost 2nd Diag to 2nd Diag lesion is 80% stenosed.  1st Diag lesion is 100% stenosed.  Balloon angioplasty was performed.  Lat 2nd Mrg lesion is 95% stenosed.  1st RPL lesion is 50% stenosed.  Ramus lesion is 95% stenosed.  Ost LM lesion is 20% stenosed.  Prox RCA lesion is 30% stenosed.  Dist  RCA lesion is 20% stenosed.  Mid Cx lesion is 70% stenosed.  RPAV lesion is 50% stenosed.  Findings: 1. 3v CAD 2. There is severe 95% restenosis in the 2nd half of the long Ramus stent 3. Mild progression of bifurcation lesion in mid LCX. Now ~ 70% 4. CAD otherwise stable 5. EF 60-65% 6. LV = 110/1 Assessment: Plan PCI ISR of Ramus with Dr. [place]. Arvilla Meresaniel Bensimhon, MD 10:57 AM   DG Chest Port 1 View  Result Date: 04/28/2019 CLINICAL DATA:  Chest pain EXAM: PORTABLE CHEST 1 VIEW COMPARISON:  07/21/2016 FINDINGS: Heart and mediastinal contours are within normal limits. No focal  opacities or effusions. No acute bony abnormality. IMPRESSION: No active disease. Electronically Signed   By: Charlett NoseKevin  Dover M.D.   On: 04/28/2019 21:38   ECHOCARDIOGRAM COMPLETE  Result Date: 04/29/2019   ECHOCARDIOGRAM REPORT   Patient Name:   [name] PolkaRNEST Latouche Date of Exam: 04/29/2019 Medical Rec #:  [id number]        Height:       68.0 in Accession #:    [id number]       Weight:       169.7 lb Date of Birth:  1960/12/04        BSA:          1.91 m Patient Age:    58 years         BP:           111/61 mmHg Patient Gender: M                HR:           65 bpm. Exam Location:  Inpatient Procedure: 2D Echo Indications:    NSTEMI I21.4  History:        Patient has prior history of Echocardiogram examinations, most                 recent 12/12/2018. Previous Myocardial Infarction and CAD; Risk                 Factors:Hypertension, Dyslipidemia and Current Smoker. Apical                 variant hypertrophic cardiomyopathy , Bradycardia.  Sonographer:    Jeryl ColumbiaJohanna Elliott Referring Phys: [id number] Melrose NakayamaGEORGE M BODZIOCK IMPRESSIONS  1. Left ventricular ejection fraction, by visual estimation, is 60 to 65%. The left ventricle has normal function. Left ventricular septal wall thickness was mildly increased. Mildly increased left ventricular posterior wall thickness. There is no left ventricular hypertrophy.  2. Global right ventricle has normal systolic function.The right ventricular size is normal. No increase in right ventricular wall thickness.  3. Left atrial size was mildly dilated.  4. Right atrial size was normal.  5. The mitral valve is normal in structure. Trace mitral valve regurgitation. No evidence of mitral stenosis.  6. The tricuspid valve is normal in structure. Tricuspid valve regurgitation is not demonstrated.  7. The aortic valve was not well visualized. Aortic valve regurgitation is not visualized. No evidence of aortic valve sclerosis or stenosis.  8. The pulmonic valve was normal in structure. Pulmonic valve  regurgitation is not visualized.  9. The inferior vena cava is normal in size with greater than 50% respiratory variability, suggesting right atrial pressure of 3 mmHg. FINDINGS  Left Ventricle: Left ventricular ejection fraction, by visual estimation, is 60 to 65%. The left ventricle has normal function. Mildly increased left ventricular posterior wall thickness. There is no left ventricular  hypertrophy. Left ventricular diastolic parameters were normal. Normal left atrial pressure. Right Ventricle: The right ventricular size is normal. No increase in right ventricular wall thickness. Global RV systolic function is has normal systolic function. Left Atrium: Left atrial size was mildly dilated. Right Atrium: Right atrial size was normal in size Pericardium: There is no evidence of pericardial effusion. Mitral Valve: The mitral valve is normal in structure. No evidence of mitral valve stenosis by observation. Trace mitral valve regurgitation. Tricuspid Valve: The tricuspid valve is normal in structure. Tricuspid valve regurgitation is not demonstrated. Aortic Valve: The aortic valve was not well visualized. Aortic valve regurgitation is not visualized. The aortic valve is structurally normal, with no evidence of sclerosis or stenosis. Pulmonic Valve: The pulmonic valve was normal in structure. Pulmonic valve regurgitation is not visualized. Aorta: The aortic root, ascending aorta and aortic arch are all structurally normal, with no evidence of dilitation or obstruction. Venous: The inferior vena cava is normal in size with greater than 50% respiratory variability, suggesting right atrial pressure of 3 mmHg. IAS/Shunts: No atrial level shunt detected by color flow Doppler. No ventricular septal defect is seen or detected. There is no evidence of an atrial septal defect.  LEFT VENTRICLE PLAX 2D LVIDd:         3.50 cm  Diastology LVIDs:         2.50 cm  LV e' lateral:   14.50 cm/s LV PW:         1.25 cm  LV E/e'  lateral: 6.0 LV IVS:        1.20 cm  LV e' medial:    9.25 cm/s LVOT diam:     2.20 cm  LV E/e' medial:  9.4 LV SV:         29 ml LV SV Index:   14.77 LVOT Area:     3.80 cm  RIGHT VENTRICLE RV S prime:     16.90 cm/s TAPSE (M-mode): 2.2 cm LEFT ATRIUM           Index       RIGHT ATRIUM           Index LA diam:      2.70 cm 1.42 cm/m  RA Area:     11.80 cm LA Vol (A2C): 38.9 ml 20.41 ml/m RA Volume:   24.60 ml  12.91 ml/m LA Vol (A4C): 63.1 ml 33.11 ml/m   AORTA Ao Root diam: 2.70 cm MITRAL VALVE MV Area (PHT): 3.21 cm             SHUNTS MV PHT:        68.44 msec           Systemic Diam: 2.20 cm MV Decel Time: 236 msec MV E velocity: 86.50 cm/s 103 cm/s MV A velocity: 68.10 cm/s 70.3 cm/s MV E/A ratio:  1.27       1.5  Chilton Si MD Electronically signed by Chilton Si MD Signature Date/Time: 04/29/2019/4:24:34 PM    Final    Disposition   Pt is being discharged home today in good condition.  Follow-up Plans & Appointments    Follow-up Information     [facility]. Go on 06/27/2019.   Why: :50am Contact information: [address] [place] [address] [phone]        Leone Brand, NP Follow up on 05/23/2019.   Specialties: Cardiology, Radiology Why: at 8am for your follow up appt. Contact information: [address] [place] [address] [phone]           Discharge Instructions     Call MD for:  redness, tenderness, or signs of infection (pain, swelling, redness, odor or green/yellow discharge around incision site)   Complete by: As directed    Diet - low sodium heart healthy   Complete by: As directed    Discharge instructions   Complete by: As directed    Radial Site Care Refer to this sheet in the next few weeks. These instructions provide you with information on caring for yourself after your procedure. Your caregiver may also give you more specific instructions. Your treatment has  been planned according to current medical practices, but problems sometimes occur. Call your caregiver if you have any problems or questions after your procedure. HOME CARE INSTRUCTIONS You may shower the day after the procedure. Remove the bandage (dressing) and gently wash the site with plain soap and water. Gently pat the site dry.  Do not apply powder or lotion to the site.  Do not submerge the affected site in water for 3 to 5 days.  Inspect the site at least twice daily.  Do not flex or bend the affected arm for 24 hours.  No lifting over 5 pounds (2.3 kg) for 5 days after your procedure.  Do not drive home if you are discharged the same day of the procedure. Have someone else drive you.  You may drive 24 hours after the procedure unless otherwise instructed by your caregiver.  What to expect: Any bruising will usually fade within 1 to 2 weeks.  Blood that collects in the tissue (hematoma) may be painful to the touch. It should usually decrease in size and tenderness within 1 to 2 weeks.  SEEK IMMEDIATE MEDICAL CARE IF: You have unusual pain at the radial site.  You have redness, warmth, swelling, or pain at the radial site.  You have drainage (other than a small amount of blood on the dressing).  You have chills.  You have a fever or persistent symptoms for more than 72 hours.  You have a fever and your symptoms suddenly get worse.  Your arm becomes pale, cool, tingly, or numb.  You have heavy bleeding from the site. Hold pressure on the site.   Increase activity slowly   Complete by: As directed        Discharge Medications     Medication List     STOP taking these medications    hydrALAZINE 100 MG tablet Commonly known as: APRESOLINE       TAKE these medications    amLODipine 5 MG tablet Commonly known as: NORVASC Take 1 tablet (5 mg total) by mouth daily. What changed:  medication strength how much to take   aspirin 81 MG chewable tablet Chew 1 tablet (81  mg total) by mouth daily.   clopidogrel 75 MG tablet Commonly known as: PLAVIX TAKE 1 TABLET BY MOUTH EVERY DAY WITH BREAKFEST What changed: See the new instructions.   Edarbyclor 40-25 MG Tabs Generic drug: Azilsartan-Chlorthalidone Take 1 tablet by mouth daily.   nitroGLYCERIN 0.4 MG SL tablet Commonly known as: NITROSTAT Place 1 tablet (0.4 mg total) under the tongue every 5 (five) minutes as needed for chest pain.   Repatha SureClick 140 MG/ML Soaj Generic drug: Evolocumab Inject 140 mg into the skin every 14 (fourteen) days.   rosuvastatin 40 MG tablet Commonly known as: CRESTOR Take 1 tablet (40 mg  total) by mouth daily.   thiamine 100 MG tablet Commonly known as: CVS B-1 Take 1 tablet (100 mg total) by mouth every other day.   VISINE OP Place 1 drop into both eyes daily as needed (dry eyes).         No                               Did the patient have a percutaneous coronary intervention (stent / angioplasty)?:  No.      Outstanding Labs/Studies   Cardiac MRI  Duration of Discharge Encounter   Greater than 30 minutes including physician time.  Signed, [name] Page NP-C 05/15/2019, 1:12 PM

## 2019-05-15 NOTE — Plan of Care (Signed)
  Problem: Education: Goal: Knowledge of General Education information will improve Description: Including pain rating scale, medication(s)/side effects and non-pharmacologic comfort measures 05/15/2019 1417 by Ethel Rana, RN Outcome: Adequate for Discharge 05/15/2019 0816 by Ethel Rana, RN Outcome: Progressing   Problem: Health Behavior/Discharge Planning: Goal: Ability to manage health-related needs will improve 05/15/2019 1417 by Ethel Rana, RN Outcome: Adequate for Discharge 05/15/2019 0816 by Ethel Rana, RN Outcome: Progressing   Problem: Clinical Measurements: Goal: Ability to maintain clinical measurements within normal limits will improve 05/15/2019 1417 by Ethel Rana, RN Outcome: Adequate for Discharge 05/15/2019 0816 by Ethel Rana, RN Outcome: Progressing Goal: Will remain free from infection 05/15/2019 1417 by Ethel Rana, RN Outcome: Adequate for Discharge 05/15/2019 0816 by Ethel Rana, RN Outcome: Progressing Goal: Diagnostic test results will improve Outcome: Adequate for Discharge Goal: Respiratory complications will improve Outcome: Adequate for Discharge Goal: Cardiovascular complication will be avoided Outcome: Adequate for Discharge   Problem: Activity: Goal: Risk for activity intolerance will decrease Outcome: Adequate for Discharge   Problem: Nutrition: Goal: Adequate nutrition will be maintained Outcome: Adequate for Discharge   Problem: Coping: Goal: Level of anxiety will decrease Outcome: Adequate for Discharge   Problem: Elimination: Goal: Will not experience complications related to bowel motility Outcome: Adequate for Discharge Goal: Will not experience complications related to urinary retention Outcome: Adequate for Discharge   Problem: Pain Managment: Goal: General experience of comfort will improve Outcome: Adequate for Discharge   Problem: Safety: Goal: Ability to remain free from  injury will improve Outcome: Adequate for Discharge   Problem: Skin Integrity: Goal: Risk for impaired skin integrity will decrease Outcome: Adequate for Discharge   Problem: Education: Goal: Understanding of CV disease, CV risk reduction, and recovery process will improve Outcome: Adequate for Discharge Goal: Individualized Educational Video(s) Outcome: Adequate for Discharge   Problem: Activity: Goal: Ability to return to baseline activity level will improve Outcome: Adequate for Discharge   Problem: Cardiovascular: Goal: Ability to achieve and maintain adequate cardiovascular perfusion will improve Outcome: Adequate for Discharge Goal: Vascular access site(s) Level 0-1 will be maintained Outcome: Adequate for Discharge   Problem: Health Behavior/Discharge Planning: Goal: Ability to safely manage health-related needs after discharge will improve Outcome: Adequate for Discharge

## 2019-05-15 NOTE — Progress Notes (Signed)
Nutrition Education Note  RD consulted for nutrition education regarding  CHF.  Per RN, pt with questions regarding diet.   Spoke with pt at bedside, who reports he is excited to be going home today (pt with active discharge orders). Pt shares that he usually consumes 3 meals per day, which consist of baked meats, canned or frozen vegetables OR salad. Pt wife does most of the cooking and has reduced sodium at the table and during cooking process.   Pt concerned about weight he has lost during hospitalization. Discussed importance of weight monitoring for fluid status.   Most of discussion was focused on ways to decrease added sodium in diet and importance of chronic disease self-management.   RD provided "Low Sodium Nutrition Therapy" handout from the Academy of Nutrition and Dietetics. Reviewed patient's dietary recall. Provided examples on ways to decrease sodium intake in diet. Discouraged intake of processed foods and use of salt shaker. Encouraged fresh fruits and vegetables as well as whole grain sources of carbohydrates to maximize fiber intake.   RD discussed why it is important for patient to adhere to diet recommendations, and emphasized the role of fluids, foods to avoid, and importance of weighing self daily. Teach back method used.  Expect fair to good compliance.  Body mass index is 25.79 kg/m. Pt meets criteria for overweight based on current BMI.  Current diet order is Heart Healthy, patient is consuming approximately 100% of meals at this time. Labs and medications reviewed. No further nutrition interventions warranted at this time. RD contact information provided. If additional nutrition issues arise, please re-consult RD.   Dimitra Woodstock A. Jimmye Norman, RD, LDN, [place] Registered Dietitian II Certified Diabetes Care and Education Specialist Pager: [phone] After hours Pager: [phone]

## 2019-05-16 ENCOUNTER — Telehealth: Payer: Self-pay | Admitting: *Deleted

## 2019-05-16 ENCOUNTER — Ambulatory Visit: Payer: Self-pay | Admitting: Cardiology

## 2019-05-16 NOTE — Telephone Encounter (Signed)
Pt was on TCM report admitted 05/11/19 for Unstable angina. Pt underwent a cardiac cath. Due to pt not having insurance he will be going to [facility] on 06/27/2019. Pt has an follow-up appt w/cardiology Isaiah Serge, NP on 05/23/2019...Johny Chess

## 2019-05-18 ENCOUNTER — Other Ambulatory Visit: Payer: Self-pay | Admitting: Interventional Cardiology

## 2019-05-22 ENCOUNTER — Encounter: Payer: Self-pay | Admitting: *Deleted

## 2019-05-22 ENCOUNTER — Telehealth: Payer: Self-pay | Admitting: *Deleted

## 2019-05-22 NOTE — Telephone Encounter (Signed)
Left message regarding appointment for Cardiac MRI scheduled 06/13/19 at 8:00 am---arrival time is 7:15 am 1st floor admission office at [facility].  Information will be mailed to patient and it is also in My Chart

## 2019-05-22 NOTE — Progress Notes (Signed)
Cardiology Office Note   Date:  05/23/2019   ID:  [name], DOB [birth year], MRN [id number]  PCP:  [name], MD  Cardiologist:  Dr. Tamala Julian     Chief Complaint  Patient presents with  . Hospitalization Follow-up      History of Present Illness: [name] is a 58 y.o. male who presents for post hospital   history of CAD(recent cutting balloon angioplasty of the ramus intermedius on 04/30/2019),apical variant hypertrophic cardiomyopathy, GERD, chronic renal insufficiency, chronic back pain, hyperlipidemia(on rosuvastatin and Repatha), hypertension, tobacco abuse,prior CVA and migraine headaches who presented to the hospital with complaints of chest pain. The patient reported acute onset of chest pain the day of admission. It progressively got worse such that atonepoint it was 10 out of 10 in intensity. The pain spread across the precordium and also radiated down the left arm. He does admit to significant stress related to his job. He works as a Development worker, community. He took 1 sublingual nitroglycerin which did not help with the chest pain. The discomfort finally improved once he received morphine and aspirin from EMS. He denied any associated shortness of breath, diaphoresis, nausea or vomiting. He stated that he has exertional fatigue. He barely walks 10-20 steps before having to stop because he feels tired. He does admit to intermittent palpitations as well. He denied orthopnea, paroxysmal nocturnal dyspnea or lower extremity edema. There have been no episodes of presyncope or syncope. Of note, the patient was admitted 2 weeks ago with similar symptoms. At that time he underwent angioplasty for restenosis in the ramus intermedius branch.    The ECG revealed voltage criteria for LVH and T wave inversions in the inferolateral leads.   Pt underwent repeat cath and ramus intermedius PCI was patent.  No targets for for PCI.  Felt symptoms were related to small vessel disease.  Did  not tolerate imdur due headache.  Home hydralazine stopped and amlodipine reduced from 10 to 5 mg.  This due to dizziness.  ASA and plavix continued. Pt is for cardiac MRI, for apical variant hypertrophic cardiomyopathy. .    Today overall much improved.  Though when he walks 60 feet or so he has chest discomfort and palpitations.  He rests and it resolves.   His BP today is stable but lower end.  HR 55,  So unable to add BB.  As noted he did not tolerate Imdur.  He is having sexual dysfunction asking if meds could cause.   He is supposed to be on edarbyclor but he is not sure, nor his wife, they will call when arrive home.  If not will add low dose arb.   He is down to 3 cigarettes per day, doing much better with plan to stop.     He does wear mask and wash hands and social distance, plans to take COVID vaccine.    Past Medical History:  Diagnosis Date  . Apical variant hypertrophic cardiomyopathy ([place])   . CAD (coronary artery disease)    a. LHC in 2011 with severe dz in D1/D2 and very distal LAD--> too small for PCI and managed medically. b.  LHC in 10/2013 after an abnormal nuclear study w/ non-obst dz/c. 01/2016 NSTEMI 2.5 x 38 mm Promus DES to Ramus    . Chronic back pain   . 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  Marland Kitchen. HOH (hard of hearing)    rgiht ear  . Hyperlipidemia   . Hypertension   . Migraine headache   . Stroke [facility](HCC)     Past Surgical History:  Procedure Laterality Date  . CARDIAC CATHETERIZATION N/A 02/24/2016   Procedure: Left Heart Cath and Coronary Angiography;  Surgeon: [name], MD;  [facility];  Service: Cardiovascular;  Laterality: N/A;  . CARDIAC CATHETERIZATION N/A 02/24/2016    Procedure: Coronary Stent Intervention;  Surgeon: [name], MD;  [facility];  Service: Cardiovascular;  Laterality: N/A;  . CORONARY ANGIOPLASTY WITH STENT PLACEMENT  2017  . CORONARY BALLOON ANGIOPLASTY N/A 04/30/2019   Procedure: CORONARY BALLOON ANGIOPLASTY;  Surgeon: SwazilandJordan, Peter M, MD;  [facility];  Service: Cardiovascular;  Laterality: N/A;  . INGUINAL HERNIA REPAIR    . LEFT HEART CATH AND CORONARY ANGIOGRAPHY N/A 04/30/2019   Procedure: LEFT HEART CATH AND CORONARY ANGIOGRAPHY;  Surgeon: Dolores PattyBensimhon, Daniel R, MD;  Location: [facility] INVASIVE CV LAB;  Service: Cardiovascular;  Laterality: N/A;  . LEFT HEART CATH AND CORONARY ANGIOGRAPHY N/A 05/14/2019   Procedure: LEFT HEART CATH AND CORONARY ANGIOGRAPHY;  Surgeon: SwazilandJordan, Peter M, MD;  [facility];  Service: Cardiovascular;  Laterality: N/A;  . LEFT HEART CATHETERIZATION WITH CORONARY ANGIOGRAM N/A 11/22/2013   Procedure: LEFT HEART CATHETERIZATION WITH CORONARY ANGIOGRAM;  Surgeon: [name], MD;  [facility];  Service: Cardiovascular;  Laterality: N/A;  . TEE WITHOUT CARDIOVERSION N/A 08/22/2015   Procedure: TRANSESOPHAGEAL ECHOCARDIOGRAM (TEE);  Surgeon: Vesta MixerPhilip J Nahser, MD;  [facility];  Service: Cardiovascular;  Laterality: N/A;     Current Outpatient Medications  Medication Sig Dispense Refill  . amLODipine (NORVASC) 5 MG tablet Take 1 tablet (5 mg total) by mouth daily. 30 tablet 1  . aspirin 81 MG chewable tablet Chew 1 tablet (81 mg total) by mouth daily.    . Azilsartan-Chlorthalidone (EDARBYCLOR) 40-25 MG TABS Take 1 tablet by mouth daily. 70 tablet 0  . clopidogrel (PLAVIX) 75 MG tablet Take 1 tablet (75 mg total) by mouth daily. 90 tablet 3  . Evolocumab (REPATHA SURECLICK) 140 MG/ML SOAJ Inject 140 mg into the skin every 14 (fourteen) days.    . nitroGLYCERIN (NITROSTAT) 0.4 MG SL tablet Place 1 tablet (0.4 mg total) under the tongue every 5  (five) minutes as needed for chest pain. 25 tablet 3  . rosuvastatin (CRESTOR) 40 MG tablet Take 1 tablet (40 mg total) by mouth daily. 90 tablet 1  . Tetrahydrozoline HCl (VISINE OP) Place 1 drop into both eyes daily as needed (dry eyes).    . thiamine (CVS B-1) 100 MG tablet Take 1 tablet (100 mg total) by mouth every other day. 45 tablet 1   No current facility-administered medications for this visit.    Allergies:   Lisinopril    Social History:  The patient  reports that he has been smoking cigarettes. He has been smoking about 0.00 packs per day. He has never used smokeless tobacco. He reports that he does not drink alcohol or use drugs.   Family History:  The patient's family history includes Colon cancer in his paternal uncle; Colon cancer (age of onset: 5266) in his father; Heart failure in his mother; Hypertension in his father  and mother.    ROS:  General:no colds or fevers, no weight changes Skin:no rashes or ulcers HEENT:no blurred vision, no congestion CV:see HPI PUL:see HPI GI:no diarrhea constipation or melena, no indigestion GU:no hematuria, no dysuria, sexual dysfunction. MS:no joint pain, no claudication Neuro:no syncope, no lightheadedness Endo:no diabetes, no thyroid disease  Wt Readings from Last 3 Encounters:  05/23/19 170 lb (77.1 kg)  05/15/19 169 lb 9.6 oz (76.9 kg)  04/30/19 168 lb 4.8 oz (76.3 kg)     PHYSICAL EXAM: VS:  BP 108/64   Pulse (!) 55   Ht 5\' 8"  (1.727 m)   Wt 170 lb (77.1 kg)   SpO2 99%   BMI 25.85 kg/m  , BMI Body mass index is 25.85 kg/m. General:Pleasant affect, NAD Skin:Warm and dry, brisk capillary refill HEENT:normocephalic, sclera clear, mucus membranes moist Neck:supple, no JVD, no bruits  Heart:S1S2 RRR without murmur, gallup, rub or click Lungs:clear without rales, rhonchi, or wheezes , non tender, + BS, do not palpate liver spleen or masses Ext:no lower ext edema, 2+ pedal pulses, 2+ radial pulses Neuro:alert  and oriented X 3, MAE, follows commands, + facial symmetry    EKG:  EKG is NOT  ordered today.   Recent Labs: 10/18/2018: TSH 1.44 04/29/2019: B Natriuretic Peptide 12.0 05/15/2019: BUN 16; Creatinine, Ser 1.50; Hemoglobin 9.5; Platelets 271; Potassium 4.0; Sodium 140    Lipid Panel    Component Value Date/Time   CHOL 77 05/12/2019 0317   CHOL 138 11/29/2016 0813   TRIG 37 05/12/2019 0317   HDL 43 05/12/2019 0317   HDL 43 11/29/2016 0813   CHOLHDL 1.8 05/12/2019 0317   VLDL 7 05/12/2019 0317   LDLCALC 27 05/12/2019 0317   LDLCALC 82 11/29/2016 0813       Other studies Reviewed: Additional studies/ records that were reviewed today include: . CT Angio Chest PE W and/or Wo Contrast  Result Date: 04/28/2019 CLINICAL DATA:  PE suspected, intermediate probability, positive D-dimer, gradual onset left chest pain EXAM: CT ANGIOGRAPHY CHEST WITH CONTRAST TECHNIQUE: Multidetector CT imaging of the chest was performed using the standard protocol during bolus administration of intravenous contrast. Multiplanar CT image reconstructions and MIPs were obtained to evaluate the vascular anatomy. CONTRAST:  04/30/2019 OMNIPAQUE IOHEXOL 350 MG/ML SOLN COMPARISON:  Chest radiograph 04/28/2019 FINDINGS: Cardiovascular: Satisfactory opacification the pulmonary arteries to the segmental level. No pulmonary artery filling defects are identified. Central pulmonary arteries are normal caliber. Normal heart size. No pericardial effusion. Coronary artery calcification is seen. Thoracic aorta is normal caliber with minimal atherosclerotic plaque. No acute aortic abnormality. The left vertebral artery arises directly from the aortic arch. Mediastinum/Nodes: No enlarged mediastinal, hilar, or axillary lymph nodes. Thyroid gland and thoracic inlet are unremarkable. Posterior bowing of the trachea compatible with imaging during exhalation. High attenuation material within the thoracic esophagus and proximal stomach.  Lungs/Pleura: Atelectatic changes posteriorly. No consolidation, features of edema, pneumothorax, or effusion. No suspicious pulmonary nodules or masses. Upper Abdomen: Stomach distended with ingested material including more high attenuation material. No acute abnormalities present in the visualized portions of the upper abdomen. Atherosclerotic calcifications of the imaged abdominal aorta as well. Musculoskeletal: Multilevel degenerative changes are present in the imaged portions of the spine. No acute osseous abnormality or suspicious osseous lesion. Review of the MIP images confirms the above findings. IMPRESSION: 1. No evidence for pulmonary embolus. 2. No acute intrathoracic process. 3. High attenuation material within the thoracic esophagus and proximal stomach, may represent ingested material with  some trace reflux. 4. Aortic Atherosclerosis (ICD10-I70.0). Electronically Signed   By: Kreg Shropshire M.D.   On: 04/28/2019 23:59   CARDIAC CATHETERIZATION  Result Date: 05/14/2019  Mid LAD lesion is 30% stenosed.  Dist LAD lesion is 90% stenosed.  1st Diag lesion is 100% stenosed.  Ramus lesion is 25% stenosed.  Lat Ramus lesion is 90% stenosed.  Mid Cx lesion is 70% stenosed.  Lat 2nd Mrg lesion is 99% stenosed.  Prox RCA to Mid RCA lesion is 20% stenosed.  RPAV lesion is 35% stenosed.  LV end diastolic pressure is normal.  1. CAD. Patient has multiple obstructive lesions in small branches including distal LAD, first diagonal, sub-branch of the ramus intermediate, and sub-branch of OM2. The ramus intermediate stent is still widely patent at prior angioplasty site. There is a moderate stenosis in the mid to distal LCx that involves a bifurcation. 2. Normal LVEDP Plan: I do not see any good targets for PCI and feel his symptoms are consistent with his small vessel disease. I would recommend aggressive anti-anginal medical therapy.  CARDIAC CATHETERIZATION  Result Date: 04/30/2019  Dist LAD  lesion is 90% stenosed.  1st Diag lesion is 100% stenosed.  Ramus lesion is 90% stenosed.  Post intervention, there is a 25% residual stenosis.  1. Successful cutting balloon angioplasty of the ramus intermediate artery for in stent restenosis. Plan; DAPT for one year for ACS indication. If patient has  restenosis again could consider repeat stenting.   CARDIAC CATHETERIZATION  Result Date: 04/30/2019  Prox LAD to Mid LAD lesion is 30% stenosed.  Dist LAD lesion is 95% stenosed.  Ost 2nd Diag to 2nd Diag lesion is 80% stenosed.  1st Diag lesion is 100% stenosed.  Balloon angioplasty was performed.  Lat 2nd Mrg lesion is 95% stenosed.  1st RPL lesion is 50% stenosed.  Ramus lesion is 95% stenosed.  Ost LM lesion is 20% stenosed.  Prox RCA lesion is 30% stenosed.  Dist RCA lesion is 20% stenosed.  Mid Cx lesion is 70% stenosed.  RPAV lesion is 50% stenosed.  Findings: 1. 3v CAD 2. There is severe 95% restenosis in the 2nd half of the long Ramus stent 3. Mild progression of bifurcation lesion in mid LCX. Now ~ 70% 4. CAD otherwise stable 5. EF 60-65% 6. LV = 110/1 Assessment: Plan PCI ISR of Ramus with Dr. [place]. Arvilla Meres, MD 10:57 AM   DG Chest Port 1 View  Result Date: 04/28/2019 CLINICAL DATA:  Chest pain EXAM: PORTABLE CHEST 1 VIEW COMPARISON:  07/21/2016 FINDINGS: Heart and mediastinal contours are within normal limits. No focal opacities or effusions. No acute bony abnormality. IMPRESSION: No active disease. Electronically Signed   By: Charlett Nose M.D.   On: 04/28/2019 21:38   ECHOCARDIOGRAM COMPLETE  Result Date: 04/29/2019   ECHOCARDIOGRAM REPORT   Patient Name:   [name] Date of Exam: 04/29/2019 Medical Rec #:  [id number]        Height:       68.0 in Accession #:    [id number]       Weight:       169.7 lb Date of Birth:  01-24-1961        BSA:          1.91 m Patient Age:    58 years         BP:           111/61 mmHg Patient Gender: M  HR:            65 bpm. Exam Location:  Inpatient Procedure: 2D Echo Indications:    NSTEMI I21.4  History:        Patient has prior history of Echocardiogram examinations, most                 recent 12/12/2018. Previous Myocardial Infarction and CAD; Risk                 Factors:Hypertension, Dyslipidemia and Current Smoker. Apical                 variant hypertrophic cardiomyopathy , Bradycardia.  Sonographer:    Jeryl ColumbiaJohanna Elliott Referring Phys: [id number] Melrose NakayamaGEORGE M BODZIOCK IMPRESSIONS  1. Left ventricular ejection fraction, by visual estimation, is 60 to 65%. The left ventricle has normal function. Left ventricular septal wall thickness was mildly increased. Mildly increased left ventricular posterior wall thickness. There is no left ventricular hypertrophy.  2. Global right ventricle has normal systolic function.The right ventricular size is normal. No increase in right ventricular wall thickness.  3. Left atrial size was mildly dilated.  4. Right atrial size was normal.  5. The mitral valve is normal in structure. Trace mitral valve regurgitation. No evidence of mitral stenosis.  6. The tricuspid valve is normal in structure. Tricuspid valve regurgitation is not demonstrated.  7. The aortic valve was not well visualized. Aortic valve regurgitation is not visualized. No evidence of aortic valve sclerosis or stenosis.  8. The pulmonic valve was normal in structure. Pulmonic valve regurgitation is not visualized.  9. The inferior vena cava is normal in size with greater than 50% respiratory variability, suggesting right atrial pressure of 3 mmHg. FINDINGS  Left Ventricle: Left ventricular ejection fraction, by visual estimation, is 60 to 65%. The left ventricle has normal function. Mildly increased left ventricular posterior wall thickness. There is no left ventricular hypertrophy. Left ventricular diastolic parameters were normal. Normal left atrial pressure. Right Ventricle: The right ventricular size is normal. No increase  in right ventricular wall thickness. Global RV systolic function is has normal systolic function. Left Atrium: Left atrial size was mildly dilated. Right Atrium: Right atrial size was normal in size Pericardium: There is no evidence of pericardial effusion. Mitral Valve: The mitral valve is normal in structure. No evidence of mitral valve stenosis by observation. Trace mitral valve regurgitation. Tricuspid Valve: The tricuspid valve is normal in structure. Tricuspid valve regurgitation is not demonstrated. Aortic Valve: The aortic valve was not well visualized. Aortic valve regurgitation is not visualized. The aortic valve is structurally normal, with no evidence of sclerosis or stenosis. Pulmonic Valve: The pulmonic valve was normal in structure. Pulmonic valve regurgitation is not visualized. Aorta: The aortic root, ascending aorta and aortic arch are all structurally normal, with no evidence of dilitation or obstruction. Venous: The inferior vena cava is normal in size with greater than 50% respiratory variability, suggesting right atrial pressure of 3 mmHg. IAS/Shunts: No atrial level shunt detected by color flow Doppler. No ventricular septal defect is seen or detected. There is no evidence of an atrial septal defect.  LEFT VENTRICLE PLAX 2D LVIDd:         3.50 cm  Diastology LVIDs:         2.50 cm  LV e' lateral:   14.50 cm/s LV PW:         1.25 cm  LV E/e' lateral: 6.0 LV IVS:  1.20 cm  LV e' medial:    9.25 cm/s LVOT diam:     2.20 cm  LV E/e' medial:  9.4 LV SV:         29 ml LV SV Index:   14.77 LVOT Area:     3.80 cm  RIGHT VENTRICLE RV S prime:     16.90 cm/s TAPSE (M-mode): 2.2 cm LEFT ATRIUM           Index       RIGHT ATRIUM           Index LA diam:      2.70 cm 1.42 cm/m  RA Area:     11.80 cm LA Vol (A2C): 38.9 ml 20.41 ml/m RA Volume:   24.60 ml  12.91 ml/m LA Vol (A4C): 63.1 ml 33.11 ml/m   AORTA Ao Root diam: 2.70 cm MITRAL VALVE MV Area (PHT): 3.21 cm             SHUNTS MV PHT:         68.44 msec           Systemic Diam: 2.20 cm MV Decel Time: 236 msec MV E velocity: 86.50 cm/s 103 cm/s MV A velocity: 68.10 cm/s 70.3 cm/s MV E/A ratio:  1.27       1.5  Chilton Si MD Electronically signed by Chilton Si MD Signature Date/Time: 04/29/2019/4:24:34 PM    Final       ASSESSMENT AND PLAN:  1.  Stable angina, thought to be small vessel disease after recath with patent PCI site.   Unable to take IMDUR so will add ranaexa to see if this will allow more activity and less pain.  Will keep on ARB  2.  CAD with PCI, on ASA and plavix.  Continue for stent patency.  3.  Palpitations symptomatic with angina, will check 14 day zio patch.  Follow up in 3 weeks.   4.  HLD on crestor and repatha checked hepatic and lipids 12/12 with LDL 27   5.  Apical variant hypertrophic cardiomyopathy  For cardiac MRI.  6.  Tobacco use, down to 3 cigarettes per day. Plans to stop    Current medicines are reviewed with the patient today.  The patient Has no concerns regarding medicines.  The following changes have been made:  See above Labs/ tests ordered today include:see above  Disposition:   FU:  see above  Signed, Nada Boozer, NP  05/23/2019 8:25 AM    [facility] [address] [place], [place], [place]  [address]/ [address] [place], [place] Phone: [phone]; Fax: [phone]  [phone]

## 2019-05-23 ENCOUNTER — Telehealth: Payer: Self-pay | Admitting: Cardiology

## 2019-05-23 ENCOUNTER — Ambulatory Visit (INDEPENDENT_AMBULATORY_CARE_PROVIDER_SITE_OTHER): Payer: Self-pay | Admitting: Cardiology

## 2019-05-23 ENCOUNTER — Other Ambulatory Visit: Payer: Self-pay

## 2019-05-23 ENCOUNTER — Other Ambulatory Visit: Payer: Self-pay | Admitting: *Deleted

## 2019-05-23 ENCOUNTER — Encounter: Payer: Self-pay | Admitting: Cardiology

## 2019-05-23 ENCOUNTER — Telehealth: Payer: Self-pay | Admitting: Radiology

## 2019-05-23 VITALS — BP 108/64 | HR 55 | Ht 68.0 in | Wt 170.0 lb

## 2019-05-23 DIAGNOSIS — Z79899 Other long term (current) drug therapy: Secondary | ICD-10-CM

## 2019-05-23 DIAGNOSIS — E785 Hyperlipidemia, unspecified: Secondary | ICD-10-CM

## 2019-05-23 DIAGNOSIS — R002 Palpitations: Secondary | ICD-10-CM

## 2019-05-23 DIAGNOSIS — I25119 Atherosclerotic heart disease of native coronary artery with unspecified angina pectoris: Secondary | ICD-10-CM

## 2019-05-23 DIAGNOSIS — Z720 Tobacco use: Secondary | ICD-10-CM

## 2019-05-23 DIAGNOSIS — I422 Other hypertrophic cardiomyopathy: Secondary | ICD-10-CM

## 2019-05-23 DIAGNOSIS — I10 Essential (primary) hypertension: Secondary | ICD-10-CM

## 2019-05-23 MED ORDER — LOSARTAN POTASSIUM-HCTZ 100-25 MG PO TABS: 1.0000 | ORAL_TABLET | Freq: Every day | ORAL | 3 refills | Status: DC

## 2019-05-23 MED ORDER — RANOLAZINE ER 500 MG PO TB12: 500.0000 mg | ORAL_TABLET | Freq: Two times a day (BID) | ORAL | 3 refills | Status: DC

## 2019-05-23 MED ORDER — EDARBYCLOR 40-25 MG PO TABS: 1.0000 | ORAL_TABLET | Freq: Every day | ORAL | 3 refills | Status: DC

## 2019-05-23 MED FILL — RANOLAZINE ER 500 MG TB12: 500 | 30 days supply | Qty: 60 | Fill #0

## 2019-05-23 MED FILL — LOSARTAN-HCTZ 100-25 MG TAB: 100-25 | 30 days supply | Qty: 30 | Fill #0

## 2019-05-23 NOTE — Telephone Encounter (Addendum)
Per Cecilie Kicks to start patient on Losartan-hydrochlorothiazide 100-25 mg once a day. Pt will finish up what he has left of the edarbyclor before starting new medication.Pt agreeable to plan. Will recheck BMET in 2 weeks

## 2019-05-23 NOTE — Patient Instructions (Signed)
Medication Instructions:  1.) START: Ranolazine (Ranexa) 500 mg twice a day   2.) STOP: Amlodipine  *If you need a refill on your cardiac medications before your next appointment, please call your pharmacy*  Lab Work: None ordered   If you have labs (blood work) drawn today and your tests are completely normal, you will receive your results only by: Marland Kitchen MyChart Message (if you have MyChart) OR . A paper copy in the mail If you have any lab test that is abnormal or we need to change your treatment, we will call you to review the results.  Testing/Procedures: A zio monitor was placed today. It will remain on for 14 days. You will then return monitor and event diary in provided box. It takes 1-2 weeks for report to be downloaded and returned to [place]. We will call you with the results. If monitor falls off or has orange flashing light, please call Zio for further instructions.    Follow-Up: At [facility], you and your health needs are our priority.  As part of our continuing mission to provide you with exceptional heart care, we have created designated Provider Care Teams.  These Care Teams include your primary Cardiologist (physician) and Advanced Practice Providers (APPs -  Physician Assistants and Nurse Practitioners) who all work together to provide you with the care you need, when you need it.  Your next appointment:   3 week(s)  The format for your next appointment:   In Person  Provider:   You may see [name], MD or one of the following Advanced Practice Providers on your designated Care Team:    Truitt Merle, NP  Cecilie Kicks, NP  Kathyrn Drown, NP   Other Instructions

## 2019-05-23 NOTE — Telephone Encounter (Signed)
Enrolled patient for a 14 day Zio monitor to be mailed to patients home.  

## 2019-05-23 NOTE — Addendum Note (Signed)
Addended by: Mendel Ryder on: 05/23/2019 09:15 AM   Modules accepted: Orders

## 2019-05-23 NOTE — Telephone Encounter (Signed)
Pt could not afford edarbyclor so will change to losartan 100 and hydrochlorothiazide 12.5  Losartan hydrochlorothiazide. 100/12.5

## 2019-05-24 ENCOUNTER — Encounter (HOSPITAL_COMMUNITY): Payer: Self-pay

## 2019-05-24 LAB — HEPATIC FUNCTION PANEL
ALT: 14 IU/L (ref 0–44)
AST: 18 IU/L (ref 0–40)
Albumin: 4.6 g/dL (ref 3.8–4.9)
Alkaline Phosphatase: 86 IU/L (ref 39–117)
Bilirubin Total: 0.7 mg/dL (ref 0.0–1.2)
Bilirubin, Direct: 0.23 mg/dL (ref 0.00–0.40)
Total Protein: 7.2 g/dL (ref 6.0–8.5)

## 2019-05-24 LAB — LIPID PANEL
Chol/HDL Ratio: 1.8 ratio (ref 0.0–5.0)
Cholesterol, Total: 81 mg/dL — ABNORMAL LOW (ref 100–199)
HDL: 44 mg/dL (ref >39)
LDL Chol Calc (NIH): 25 mg/dL (ref 0–99)
Triglycerides: 46 mg/dL (ref 0–149)
VLDL Cholesterol Cal: 12 mg/dL (ref 5–40)

## 2019-05-28 ENCOUNTER — Other Ambulatory Visit (INDEPENDENT_AMBULATORY_CARE_PROVIDER_SITE_OTHER): Payer: Self-pay

## 2019-05-28 DIAGNOSIS — R002 Palpitations: Secondary | ICD-10-CM

## 2019-06-08 ENCOUNTER — Telehealth (HOSPITAL_COMMUNITY): Payer: Self-pay

## 2019-06-08 NOTE — Telephone Encounter (Signed)
No response from pt, closed referral. °

## 2019-06-11 ENCOUNTER — Encounter (HOSPITAL_COMMUNITY): Payer: Self-pay

## 2019-06-11 ENCOUNTER — Telehealth (HOSPITAL_COMMUNITY): Payer: Self-pay | Admitting: Emergency Medicine

## 2019-06-11 NOTE — Telephone Encounter (Signed)
Left message on voicemail with name and callback number Harold Moncus RN Navigator Cardiac Imaging [facility] [phone] Office [phone] Cell  

## 2019-06-12 NOTE — Progress Notes (Deleted)
Cardiology Office Note   Date:  06/12/2019   ID:  [name], DOB January 31, 1961, MRN [id number]  PCP:  Etta Grandchild, MD  Cardiologist:  Dr. Katrinka Blazing    No chief complaint on file.     History of Present Illness: [name] is a 59 y.o. male who presents for ***  history of CAD(recent cutting balloon angioplasty of the ramus intermedius on 04/30/2019),apical variant hypertrophic cardiomyopathy, GERD, chronic renal insufficiency, chronic back pain, hyperlipidemia(on rosuvastatin and Repatha), hypertension, tobacco abuse,prior CVA and migraine headaches who presented to the hospital with complaints of chest pain. The patient reportedacute onset of chest pain the day of admission. It progressively got worse such that atonepoint it was 10 out of 10 in intensity. The pain spread across the precordium and also radiated down the left arm. He does admit to significant stress related to his job. He works as a Nutritional therapist. He took 1 sublingual nitroglycerin which did not help with the chest pain. The discomfort finally improved once he received morphine and aspirin from EMS. He denied any associated shortness of breath, diaphoresis, nausea or vomiting. He statedthat he has exertional fatigue. He barely walks 10-20 steps before having to stop because he feels tired. He does admit to intermittent palpitations as well. He deniedorthopnea, paroxysmal nocturnal dyspnea or lower extremity edema. There have been no episodes of presyncope or syncope. Of note, the patient was admitted 2 weeks ago with similar symptoms. At that time he underwent angioplasty for restenosis in the ramus intermedius branch.    The ECG revealed voltage criteria for LVH and T wave inversions in the inferolateral leads.   Pt underwent repeat cath and ramus intermedius PCI was patent.  No targets for for PCI.  Felt symptoms were related to small vessel disease.  Did not tolerate imdur due headache.  Home hydralazine  stopped and amlodipine reduced from 10 to 5 mg.  This due to dizziness.  ASA and plavix continued. Pt is for cardiac MRI, for apical variant hypertrophic cardiomyopathy. .    Today overall much improved.  Though when he walks 60 feet or so he has chest discomfort and palpitations.  He rests and it resolves.   His BP today is stable but lower end.  HR 55,  So unable to add BB.  As noted he did not tolerate Imdur.  He is having sexual dysfunction asking if meds could cause.   He is supposed to be on edarbyclor but he is not sure, nor his wife, they will call when arrive home.  If not will add low dose arb.   He is down to 3 cigarettes per day, doing much better with plan to stop.    Changed imdur to ranexa cardiac MRI tobacco  zio patch not done, not done   Past Medical History:  Diagnosis Date  . Apical variant hypertrophic cardiomyopathy (HCC)   . CAD (coronary artery disease)    a. LHC in 2011 with severe dz in D1/D2 and very distal LAD--> too small for PCI and managed medically. b.  LHC in 10/2013 after an abnormal nuclear study w/ non-obst dz/c. 01/2016 NSTEMI 2.5 x 38 mm Promus DES to Ramus    . Chronic back pain   . 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  Marland Kitchen HOH (hard of hearing)    rgiht ear  . Hyperlipidemia   . Hypertension   . Migraine headache   . Stroke [facility]     Past Surgical History:  Procedure Laterality Date  . CARDIAC CATHETERIZATION N/A 02/24/2016   Procedure: Left Heart Cath and Coronary Angiography;  Surgeon: Tonny Bollman, MD;  Location[facility] INVASIVE CV LAB;  Service: Cardiovascular;  Laterality: N/A;  . CARDIAC CATHETERIZATION N/A 02/24/2016   Procedure: Coronary Stent Intervention;  Surgeon: Tonny Bollman, MD;   Location: [facility] INVASIVE CV LAB;  Service: Cardiovascular;  Laterality: N/A;  . CORONARY ANGIOPLASTY WITH STENT PLACEMENT  2017  . CORONARY BALLOON ANGIOPLASTY N/A 04/30/2019   Procedure: CORONARY BALLOON ANGIOPLASTY;  Surgeon: Swaziland, Peter M, MD;  Location: [facility] INVASIVE CV LAB;  Service: Cardiovascular;  Laterality: N/A;  . INGUINAL HERNIA REPAIR    . LEFT HEART CATH AND CORONARY ANGIOGRAPHY N/A 04/30/2019   Procedure: LEFT HEART CATH AND CORONARY ANGIOGRAPHY;  Surgeon: Dolores Patty, MD;  Location: [facility] INVASIVE CV LAB;  Service: Cardiovascular;  Laterality: N/A;  . LEFT HEART CATH AND CORONARY ANGIOGRAPHY N/A 05/14/2019   Procedure: LEFT HEART CATH AND CORONARY ANGIOGRAPHY;  Surgeon: Swaziland, Peter M, MD;  Location: [facility] INVASIVE CV LAB;  Service: Cardiovascular;  Laterality: N/A;  . LEFT HEART CATHETERIZATION WITH CORONARY ANGIOGRAM N/A 11/22/2013   Procedure: LEFT HEART CATHETERIZATION WITH CORONARY ANGIOGRAM;  Surgeon: [name], MD;  Location: [facility] CATH LAB;  Service: Cardiovascular;  Laterality: N/A;  . TEE WITHOUT CARDIOVERSION N/A 08/22/2015   Procedure: TRANSESOPHAGEAL ECHOCARDIOGRAM (TEE);  Surgeon: Vesta Mixer, MD;  Location: [facility] ENDOSCOPY;  Service: Cardiovascular;  Laterality: N/A;     Current Outpatient Medications  Medication Sig Dispense Refill  . aspirin 81 MG chewable tablet Chew 1 tablet (81 mg total) by mouth daily.    . Azilsartan-Chlorthalidone (EDARBYCLOR) 40-25 MG TABS Take 1 tablet by mouth daily. 90 tablet 3  . clopidogrel (PLAVIX) 75 MG tablet Take 1 tablet (75 mg total) by mouth daily. 90 tablet 3  . Evolocumab (REPATHA SURECLICK) 140 MG/ML SOAJ Inject 140 mg into the skin every 14 (fourteen) days.    Marland Kitchen losartan-hydrochlorothiazide (HYZAAR) 100-25 MG tablet Take 1 tablet by mouth daily. 90 tablet 3  . nitroGLYCERIN (NITROSTAT) 0.4 MG SL tablet Place 1 tablet (0.4 mg total) under the tongue every 5 (five) minutes as needed for chest pain. 25 tablet 3  .  ranolazine (RANEXA) 500 MG 12 hr tablet Take 1 tablet (500 mg total) by mouth 2 (two) times daily. 180 tablet 3  . rosuvastatin (CRESTOR) 40 MG tablet Take 1 tablet (40 mg total) by mouth daily. 90 tablet 1  . Tetrahydrozoline HCl (VISINE OP) Place 1 drop into both eyes daily as needed (dry eyes).    . thiamine (CVS B-1) 100 MG tablet Take 1 tablet (100 mg total) by mouth every other day. 45 tablet 1   No current facility-administered medications for this visit.    Allergies:   Lisinopril    Social History:  The patient  reports that he has been smoking cigarettes. He has been smoking about 0.00 packs per day. He has never used smokeless tobacco. He reports that he does not drink alcohol or use drugs.   Family History:  The patient's ***family history includes Colon cancer in his paternal uncle; Colon cancer (age of onset: 9) in his father; Heart failure in his mother;  Hypertension in his father and mother.    ROS:  General:no colds or fevers, no weight changes Skin:no rashes or ulcers HEENT:no blurred vision, no congestion CV:see HPI PUL:see HPI GI:no diarrhea constipation or melena, no indigestion GU:no hematuria, no dysuria MS:no joint pain, no claudication Neuro:no syncope, no lightheadedness Endo:no diabetes, no thyroid disease Wt Readings from Last 3 Encounters:  05/23/19 170 lb (77.1 kg)  05/15/19 169 lb 9.6 oz (76.9 kg)  04/30/19 168 lb 4.8 oz (76.3 kg)     PHYSICAL EXAM: VS:  There were no vitals taken for this visit. , BMI There is no height or weight on file to calculate BMI. General:Pleasant affect, NAD Skin:Warm and dry, brisk capillary refill HEENT:normocephalic, sclera clear, mucus membranes moist Neck:supple, no JVD, no bruits  Heart:S1S2 RRR without murmur, gallup, rub or click Lungs:clear without rales, rhonchi, or wheezes DGL:OVFI, non tender, + BS, do not palpate liver spleen or masses Ext:no lower ext edema, 2+ pedal pulses, 2+ radial pulses Neuro:alert  and oriented, MAE, follows commands, + facial symmetry    EKG:  EKG is ordered today. The ekg ordered today demonstrates ***   Recent Labs: 10/18/2018: TSH 1.44 04/29/2019: B Natriuretic Peptide 12.0 05/15/2019: BUN 16; Creatinine, Ser 1.50; Hemoglobin 9.5; Platelets 271; Potassium 4.0; Sodium 140 05/23/2019: ALT 14    Lipid Panel    Component Value Date/Time   CHOL 81 (L) 05/23/2019 0800   TRIG 46 05/23/2019 0800   HDL 44 05/23/2019 0800   CHOLHDL 1.8 05/23/2019 0800   CHOLHDL 1.8 05/12/2019 0317   VLDL 7 05/12/2019 0317   LDLCALC 25 05/23/2019 0800       Other studies Reviewed: Additional studies/ records that were reviewed today include: ***.   ASSESSMENT AND PLAN:  1.  ***   Current medicines are reviewed with the patient today.  The patient Has no concerns regarding medicines.  The following changes have been made:  See above Labs/ tests ordered today include:see above  Disposition:   FU:  see above  Signed, Cecilie Kicks, NP  06/12/2019 9:05 PM    [facility] [address], [place], [place] Phone: [phone]; Fax: [phone]

## 2019-06-13 ENCOUNTER — Ambulatory Visit (HOSPITAL_COMMUNITY): Admission: RE | Admit: 2019-06-13 | Payer: Self-pay | Source: Ambulatory Visit

## 2019-06-13 ENCOUNTER — Ambulatory Visit: Payer: Self-pay | Admitting: Cardiology

## 2019-06-27 ENCOUNTER — Ambulatory Visit: Payer: Self-pay | Attending: Family Medicine | Admitting: Family Medicine

## 2019-06-27 ENCOUNTER — Other Ambulatory Visit: Payer: Self-pay | Admitting: Family Medicine

## 2019-06-27 ENCOUNTER — Other Ambulatory Visit: Payer: Self-pay

## 2019-06-27 ENCOUNTER — Encounter: Payer: Self-pay | Admitting: Family Medicine

## 2019-06-27 VITALS — BP 121/65 | HR 66 | Temp 97.2°F | Ht 70.0 in | Wt 174.0 lb

## 2019-06-27 DIAGNOSIS — Z79899 Other long term (current) drug therapy: Secondary | ICD-10-CM

## 2019-06-27 DIAGNOSIS — I25119 Atherosclerotic heart disease of native coronary artery with unspecified angina pectoris: Secondary | ICD-10-CM

## 2019-06-27 DIAGNOSIS — N2889 Other specified disorders of kidney and ureter: Secondary | ICD-10-CM

## 2019-06-27 DIAGNOSIS — E785 Hyperlipidemia, unspecified: Secondary | ICD-10-CM

## 2019-06-27 DIAGNOSIS — I10 Essential (primary) hypertension: Secondary | ICD-10-CM

## 2019-06-27 DIAGNOSIS — Z7902 Long term (current) use of antithrombotics/antiplatelets: Secondary | ICD-10-CM

## 2019-06-27 DIAGNOSIS — N182 Chronic kidney disease, stage 2 (mild): Secondary | ICD-10-CM

## 2019-06-27 DIAGNOSIS — E519 Thiamine deficiency, unspecified: Secondary | ICD-10-CM

## 2019-06-27 MED ORDER — LOSARTAN POTASSIUM-HCTZ 100-25 MG PO TABS: 1.0000 | ORAL_TABLET | Freq: Every day | ORAL | 3 refills | Status: DC

## 2019-06-27 MED ORDER — THIAMINE HCL 100 MG PO TABS: 100.0000 mg | ORAL_TABLET | ORAL | 1 refills | Status: DC

## 2019-06-27 MED ORDER — REPATHA SURECLICK 140 MG/ML ~~LOC~~ SOAJ: 140.0000 mg | SUBCUTANEOUS | 11 refills | Status: DC

## 2019-06-27 MED ORDER — CLOPIDOGREL BISULFATE 75 MG PO TABS: 75.0000 mg | ORAL_TABLET | Freq: Every day | ORAL | 3 refills | Status: DC

## 2019-06-27 MED ORDER — RANOLAZINE ER 500 MG PO TB12: 500.0000 mg | ORAL_TABLET | Freq: Two times a day (BID) | ORAL | 3 refills | Status: DC

## 2019-06-27 MED ORDER — NITROGLYCERIN 0.4 MG SL SUBL: 0.4000 mg | SUBLINGUAL_TABLET | SUBLINGUAL | 3 refills | Status: DC | PRN

## 2019-06-27 MED ORDER — ROSUVASTATIN CALCIUM 40 MG PO TABS: 40.0000 mg | ORAL_TABLET | Freq: Every day | ORAL | 1 refills | Status: DC

## 2019-06-27 MED FILL — NITROGLYCERIN 0.4 MG TAB SL: 0.4 | 5 days supply | Qty: 25 | Fill #1

## 2019-06-27 MED FILL — REPATHA SURECLICK 140 MG/ML: 140 | 28 days supply | Qty: 2 | Fill #0

## 2019-06-27 MED FILL — LOSARTAN-HCTZ 100-25 MG TAB: 100-25 | 30 days supply | Qty: 30 | Fill #1

## 2019-06-27 MED FILL — NITROGLYCERIN 0.4 MG TAB SL: 0.4 | 5 days supply | Qty: 25 | Fill #0

## 2019-06-27 NOTE — Progress Notes (Signed)
Pt. Is here to establish care.  Pt. See a Cardiologist.

## 2019-06-27 NOTE — Progress Notes (Signed)
Subjective:  Patient ID: [name], male    DOB: Apr 08, 1961  Age: 59 y.o. MRN: [id number]  CC: New Patient (Initial Visit)   HPI Brenon Antosh, 59 year old African-American male, who presents to establish care.  He is status post hospitalization from 05/11/2019 through 05/15/2019 with principal diagnosis of unstable angina and active medical issues of hypertension, GERD, apical variant hypertrophic cardiomyopathy and chronic renal insufficiency-stage II.  He additionally has past history of chronic back pain, hyperlipidemia, tobacco abuse, migraines and prior CVA.  Patient with heart cath on 05/14/2019 showing multiple obstructive lesions in the small branches with patent ramus intermedius stent from prior angioplasty on 04/30/2019.  He reports that he went to the emergency department on that date secondary to onset of left-sided chest pain as well as pain in the left arm while he was at work.  He reports no current issues with chest pain at this time.  He is compliant with medications but needs refills.  He reports no nitroglycerin use since his last hospitalization.  He denies any current palpitations, no shortness of breath or cough, no abdominal pain-no nausea/vomiting/diarrhea or constipation.  He continues to take Plavix and denies any unusual bruising or bleeding.  No blood in the stool or black stools.  No increased muscle or joint pain associated with his use of cholesterol medicine.  Past Medical History:  Diagnosis Date  . Apical variant hypertrophic cardiomyopathy ([employer])   . CAD (coronary artery disease)    a. LHC in 2011 with severe dz in D1/D2 and very distal LAD--> too small for PCI and managed medically. b.  LHC in 10/2013 after an abnormal nuclear study w/ non-obst dz/c. 01/2016 NSTEMI 2.5 x 38 mm Promus DES to Ramus    . Chronic back pain   . 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  Marland Kitchen HOH (hard of hearing)    rgiht ear  . Hyperlipidemia   . Hypertension   . Migraine headache   . Stroke [facility])     Past Surgical History:  Procedure Laterality Date  . CARDIAC CATHETERIZATION N/A 02/24/2016   Procedure: Left Heart Cath and Coronary Angiography;  Surgeon: Sherren Mocha, MD;  Location: [place] CV LAB;  Service: Cardiovascular;  Laterality: N/A;  . CARDIAC CATHETERIZATION N/A 02/24/2016   Procedure: Coronary Stent Intervention;  Surgeon: Sherren Mocha, MD;  Location: [place] CV LAB;  Service: Cardiovascular;  Laterality: N/A;  . CORONARY ANGIOPLASTY WITH STENT PLACEMENT  2017  . CORONARY BALLOON ANGIOPLASTY N/A 04/30/2019   Procedure: CORONARY BALLOON ANGIOPLASTY;  Surgeon: Martinique, Peter M, MD;  Location: [place] CV LAB;  Service: Cardiovascular;  Laterality: N/A;  . INGUINAL HERNIA REPAIR    . LEFT HEART CATH AND CORONARY ANGIOGRAPHY N/A 04/30/2019   Procedure: LEFT HEART CATH AND CORONARY ANGIOGRAPHY;  Surgeon: Jolaine Artist, MD;  Location: [place] CV LAB;  Service: Cardiovascular;  Laterality: N/A;  . LEFT HEART CATH AND CORONARY ANGIOGRAPHY N/A 05/14/2019   Procedure: LEFT HEART CATH AND CORONARY ANGIOGRAPHY;  Surgeon: Martinique, Peter M, MD;  Location: [place] CV LAB;  Service: Cardiovascular;  Laterality: N/A;  . LEFT HEART CATHETERIZATION WITH CORONARY ANGIOGRAM N/A 11/22/2013   Procedure: LEFT HEART CATHETERIZATION WITH CORONARY ANGIOGRAM;  Surgeon: [name], MD;  Location: [facility] CATH LAB;  Service: Cardiovascular;  Laterality: N/A;  . TEE WITHOUT CARDIOVERSION N/A 08/22/2015   Procedure: TRANSESOPHAGEAL ECHOCARDIOGRAM (TEE);  Surgeon: Vesta Mixer, MD;  Location: [facility] ENDOSCOPY;  Service: Cardiovascular;  Laterality: N/A;    Family History  Problem Relation Age of  Onset  . Hypertension Mother   . Heart failure Mother   . Hypertension Father   . Colon cancer Father 66  . Colon cancer Paternal Uncle     Social History   Tobacco Use  . Smoking status: Current Some Day Smoker    Packs/day: 0.00    Types: Cigarettes  . Smokeless tobacco: Never Used  Substance Use Topics  . Alcohol use: No    Alcohol/week: 0.0 standard drinks    ROS Review of Systems  Constitutional: Positive for fatigue (Mild). Negative for chills and fever.  HENT: Negative for sore throat and trouble swallowing.   Eyes: Negative for photophobia and visual disturbance.  Respiratory: Negative for cough and shortness of breath.   Cardiovascular: Negative for chest pain and palpitations.  Gastrointestinal: Negative for abdominal pain, blood in stool, constipation, diarrhea and nausea.  Endocrine: Negative for cold intolerance, heat intolerance, polydipsia, polyphagia and polyuria.  Genitourinary: Negative for dysuria and frequency.  Musculoskeletal: Positive for back pain (Chronic, recurrent). Negative for arthralgias.  Skin: Negative for rash and wound.  Neurological: Negative for dizziness and headaches.  Hematological: Negative for adenopathy. Does not bruise/bleed easily.    Objective:   Today's Vitals: BP 121/65 (BP Location: Left Arm, Patient Position: Sitting, Cuff Size: Normal)   Pulse 66   Temp (!) 97.2 F (36.2 C) (Oral)   Ht 5\' 10"  (1.778 m)   Wt 174 lb (78.9 kg)   SpO2 98%   BMI 24.97 kg/m   Physical Exam Constitutional:      Appearance: Normal appearance.  Neck:     Vascular: No carotid bruit.  Cardiovascular:     Rate and Rhythm: Normal rate and regular rhythm.  Pulmonary:     Effort: Pulmonary effort is normal.     Breath sounds: Normal breath sounds.  Abdominal:     Palpations: Abdomen is soft.     Tenderness: There is no abdominal tenderness. There is no right CVA tenderness, left CVA tenderness, guarding or rebound.  Musculoskeletal:         General: No tenderness or deformity.     Cervical back: Normal range of motion and neck supple.     Right lower leg: No edema.     Left lower leg: No edema.     Comments: Thoracolumbar paraspinous spasm present  Skin:    General: Skin is warm and dry.  Neurological:     General: No focal deficit present.     Mental Status: He is alert and oriented to person, place, and time.  Psychiatric:        Mood and Affect: Mood normal.        Behavior: Behavior normal.     Assessment & Plan:  1. Thiamine deficiency Refill provided of thiamine, which was prescribed at time of hospital discharge due to thiamine deficiency. - thiamine (CVS B-1) 100 MG tablet; Take 1 tablet (100 mg total) by mouth every other day.  Dispense: 45 tablet; Refill: 1  2. Coronary artery disease involving native coronary artery of native heart with angina pectoris [facility]) Discussed the need for patient to make sure that he is taking all prescribed medications  to help with secondary prevention for coronary artery disease.  He is also encouraged to make sure that he completely stop smoking as he reports that he occasionally smokes socially.  He is provided with refills of his cholesterol medications and antithrombotic/antiplatelet agents.  He will have CBC and comprehensive metabolic panel in follow-up of medication usage.  Continue follow-up with cardiology. - Comprehensive metabolic panel - CBC - rosuvastatin (CRESTOR) 40 MG tablet; Take 1 tablet (40 mg total) by mouth daily.  Dispense: 90 tablet; Refill: 1 - ranolazine (RANEXA) 500 MG 12 hr tablet; Take 1 tablet (500 mg total) by mouth 2 (two) times daily.  Dispense: 180 tablet; Refill: 3 - nitroGLYCERIN (NITROSTAT) 0.4 MG SL tablet; Place 1 tablet (0.4 mg total) under the tongue every 5 (five) minutes as needed for chest pain.  Dispense: 25 tablet; Refill: 3 - Evolocumab (REPATHA SURECLICK) 140 MG/ML SOAJ; Inject 140 mg into the skin every 14 (fourteen) days.  Dispense: 1  mL; Refill: 11 - clopidogrel (PLAVIX) 75 MG tablet; Take 1 tablet (75 mg total) by mouth daily.  Dispense: 90 tablet; Refill: 3  3. Hyperlipidemia, unspecified hyperlipidemia type Low-fat diet and continued use of prescription medications, rosuvastatin and Repatha to help lower cholesterol to prevent new blockages. - Comprehensive metabolic panel - rosuvastatin (CRESTOR) 40 MG tablet; Take 1 tablet (40 mg total) by mouth daily.  Dispense: 90 tablet; Refill: 1 - Evolocumab (REPATHA SURECLICK) 140 MG/ML SOAJ; Inject 140 mg into the skin every 14 (fourteen) days.  Dispense: 1 mL; Refill: 11  4. Essential hypertension Blood pressure appears well-controlled at today's visit on his current losartan hydrochlorothiazide 100-25 mg which was refilled at today's visit. - losartan-hydrochlorothiazide (HYZAAR) 100-25 MG tablet; Take 1 tablet by mouth daily.  Dispense: 90 tablet; Refill: 3  5. Long-term use of high-risk medication Comprehensive metabolic panel in follow-up of long-term use of high-risk medications for the treatment of hypertension and hyperlipidemia - Comprehensive metabolic panel  6. Long term (current) use of antithrombotics/antiplatelets CBC in follow-up of use of antithrombotic/antiplatelet medication - CBC  7. Chronic renal insufficiency, stage II (mild) Patient with history of chronic renal insufficiency, stage II and importance of control of blood pressure stressed patient will have CBC and electrolytes/creatinine as part of comprehensive metabolic panel.  Outpatient Encounter Medications as of 06/27/2019  Medication Sig  . aspirin 81 MG chewable tablet Chew 1 tablet (81 mg total) by mouth daily.  . clopidogrel (PLAVIX) 75 MG tablet Take 1 tablet (75 mg total) by mouth daily.  . Evolocumab (REPATHA SURECLICK) 140 MG/ML SOAJ Inject 140 mg into the skin every 14 (fourteen) days.  Marland Kitchen losartan-hydrochlorothiazide (HYZAAR) 100-25 MG tablet Take 1 tablet by mouth daily.  .  nitroGLYCERIN (NITROSTAT) 0.4 MG SL tablet Place 1 tablet (0.4 mg total) under the tongue every 5 (five) minutes as needed for chest pain.  . ranolazine (RANEXA) 500 MG 12 hr tablet Take 1 tablet (500 mg total) by mouth 2 (two) times daily.  . rosuvastatin (CRESTOR) 40 MG tablet Take 1 tablet (40 mg total) by mouth daily.  . Tetrahydrozoline HCl (VISINE OP) Place 1 drop into both eyes daily as needed (dry eyes).  . thiamine (CVS B-1) 100 MG tablet Take 1 tablet (100 mg total) by mouth every other day.  . [DISCONTINUED] clopidogrel (PLAVIX) 75 MG tablet Take 1 tablet (75 mg total) by mouth daily.  . [DISCONTINUED] Evolocumab (REPATHA SURECLICK) 140 MG/ML SOAJ Inject 140 mg into the skin every 14 (fourteen) days.  . [  DISCONTINUED] losartan-hydrochlorothiazide (HYZAAR) 100-25 MG tablet Take 1 tablet by mouth daily.  . [DISCONTINUED] nitroGLYCERIN (NITROSTAT) 0.4 MG SL tablet Place 1 tablet (0.4 mg total) under the tongue every 5 (five) minutes as needed for chest pain.  . [DISCONTINUED] ranolazine (RANEXA) 500 MG 12 hr tablet Take 1 tablet (500 mg total) by mouth 2 (two) times daily.  . [DISCONTINUED] rosuvastatin (CRESTOR) 40 MG tablet Take 1 tablet (40 mg total) by mouth daily.  . [DISCONTINUED] thiamine (CVS B-1) 100 MG tablet Take 1 tablet (100 mg total) by mouth every other day.  . Azilsartan-Chlorthalidone (EDARBYCLOR) 40-25 MG TABS Take 1 tablet by mouth daily. (Patient not taking: Reported on 06/27/2019)   No facility-administered encounter medications on file as of 06/27/2019.    An After Visit Summary was printed and given to the patient.   Follow-up: Return in about 4 months (around 10/25/2019) for chronic issues; sooner if needed.    Cain Saupe MD

## 2019-06-28 LAB — COMPREHENSIVE METABOLIC PANEL WITH GFR
ALT: 9 IU/L (ref 0–44)
AST: 19 IU/L (ref 0–40)
Albumin/Globulin Ratio: 2 (ref 1.2–2.2)
Albumin: 4.5 g/dL (ref 3.8–4.9)
Alkaline Phosphatase: 81 IU/L (ref 39–117)
BUN/Creatinine Ratio: 12 (ref 9–20)
BUN: 16 mg/dL (ref 6–24)
Bilirubin Total: 0.7 mg/dL (ref 0.0–1.2)
CO2: 25 mmol/L (ref 20–29)
Calcium: 9.4 mg/dL (ref 8.7–10.2)
Chloride: 101 mmol/L (ref 96–106)
Creatinine, Ser: 1.35 mg/dL — ABNORMAL HIGH (ref 0.76–1.27)
GFR calc Af Amer: 67 mL/min/1.73
GFR calc non Af Amer: 58 mL/min/1.73 — ABNORMAL LOW
Globulin, Total: 2.3 g/dL (ref 1.5–4.5)
Glucose: 82 mg/dL (ref 65–99)
Potassium: 3.8 mmol/L (ref 3.5–5.2)
Sodium: 140 mmol/L (ref 134–144)
Total Protein: 6.8 g/dL (ref 6.0–8.5)

## 2019-06-28 LAB — CBC
Hematocrit: 34.2 % — ABNORMAL LOW (ref 37.5–51.0)
Hemoglobin: 11 g/dL — ABNORMAL LOW (ref 13.0–17.7)
MCH: 29.4 pg (ref 26.6–33.0)
MCHC: 32.2 g/dL (ref 31.5–35.7)
MCV: 91 fL (ref 79–97)
Platelets: 286 x10E3/uL (ref 150–450)
RBC: 3.74 x10E6/uL — ABNORMAL LOW (ref 4.14–5.80)
RDW: 11.4 % — ABNORMAL LOW (ref 11.6–15.4)
WBC: 4.7 x10E3/uL (ref 3.4–10.8)

## 2019-07-02 NOTE — Progress Notes (Deleted)
CARDIOLOGY OFFICE NOTE  Date:  07/03/2019    [name] Date of Birth: 06/08/1960 Medical Record #[id number]  PCP:  Cain Saupe, MD  Cardiologist:  Princella Ion chief complaint on file.   History of Present Illness: [name] is a 59 y.o. male who presents today for a follow up visit. Seen for Dr. Katrinka Blazing.   He has a history of known CAD (prior cutting balloon angioplasty of the ramus intermedius on 04/30/2019),apical variant hypertrophic cardiomyopathy, GERD, chronic renal insufficiency, chronic back pain, hyperlipidemia(on rosuvastatin and Repatha), hypertension, tobacco abuse,prior CVA and migraine headaches.   Admitted back in December 2020 with chest pain/palpitations.  This was about 2 weeks following his angioplasty for restenosis in the ramus intermedius branch. He  underwent repeat cath and ramus intermedius PCI was patent.  No targets for for PCI.  Felt symptoms were related to small vessel disease. Did not tolerate Imdur due headache.  Home hydralazine stopped and amlodipine reduced from 10 to 5 mg due to dizziness.  ASA and plavix continued. To have cardiac MRI for his apical variant hypertrophic cardiomyopathy.   Last seen right before Christmas by Vernona Rieger - he was doing better. Not able to have beta blocker due to bradycardia. Smoking less. 14 day monitor arranged.   I do not see completion of cardiac MRI.   The patient {does/does not:200015} have symptoms concerning for COVID-19 infection (fever, chills, cough, or new shortness of breath).   Comes in today. Here with   Past Medical History:  Diagnosis Date  . Apical variant hypertrophic cardiomyopathy (HCC)   . CAD (coronary artery disease)    a. LHC in 2011 with severe dz in D1/D2 and very distal LAD--> too small for PCI and managed medically. b.  LHC in 10/2013 after an abnormal nuclear study w/ non-obst dz/c. 01/2016 NSTEMI 2.5 x 38 mm Promus DES to Ramus    . Chronic back pain   . 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  Marland Kitchen HOH (hard of hearing)    rgiht ear  . Hyperlipidemia   . Hypertension   . Migraine headache   . Stroke [facility])     Past Surgical History:  Procedure Laterality Date  . CARDIAC CATHETERIZATION N/A 02/24/2016   Procedure: Left Heart Cath and Coronary Angiography;  Surgeon: Tonny Bollman, MD;  Location: [facility] INVASIVE CV LAB;  Service: Cardiovascular;  Laterality: N/A;  . CARDIAC CATHETERIZATION N/A 02/24/2016   Procedure: Coronary Stent Intervention;  Surgeon: Tonny Bollman, MD;  Location: [facility] INVASIVE CV LAB;  Service: Cardiovascular;  Laterality: N/A;  . CORONARY ANGIOPLASTY WITH STENT PLACEMENT  2017  . CORONARY BALLOON ANGIOPLASTY N/A 04/30/2019   Procedure: CORONARY BALLOON ANGIOPLASTY;  Surgeon: Swaziland, Peter M, MD;  Location: [facility] INVASIVE CV LAB;  Service: Cardiovascular;  Laterality: N/A;  . INGUINAL HERNIA REPAIR    . LEFT HEART CATH AND CORONARY ANGIOGRAPHY N/A 04/30/2019   Procedure: LEFT HEART CATH AND CORONARY ANGIOGRAPHY;  Surgeon: Dolores Patty, MD;  Location: [facility] INVASIVE CV LAB;  Service: Cardiovascular;  Laterality: N/A;  . LEFT HEART CATH AND CORONARY ANGIOGRAPHY N/A 05/14/2019   Procedure: LEFT HEART CATH AND CORONARY ANGIOGRAPHY;  Surgeon: Swaziland, Peter M, MD;  Location:  [place] INVASIVE CV LAB;  Service: Cardiovascular;  Laterality: N/A;  . LEFT HEART CATHETERIZATION WITH CORONARY ANGIOGRAM N/A 11/22/2013   Procedure: LEFT HEART CATHETERIZATION WITH CORONARY ANGIOGRAM;  Surgeon: [name], MD;  Location: [facility] CATH LAB;  Service: Cardiovascular;  Laterality: N/A;  . TEE WITHOUT CARDIOVERSION N/A 08/22/2015   Procedure: TRANSESOPHAGEAL ECHOCARDIOGRAM (TEE);  Surgeon: Thayer Headings, MD;   Location: [facility] ENDOSCOPY;  Service: Cardiovascular;  Laterality: N/A;     Medications: No outpatient medications have been marked as taking for the 07/03/19 encounter (Appointment) with Burtis Junes, NP.     Allergies: Allergies  Allergen Reactions  . Lisinopril Swelling    Facial swelling    Social History: The patient  reports that he has been smoking cigarettes. He has been smoking about 0.00 packs per day. He has never used smokeless tobacco. He reports that he does not drink alcohol or use drugs.   Family History: The patient's ***family history includes Colon cancer in his paternal uncle; Colon cancer (age of onset: 57) in his father; Heart failure in his mother; Hypertension in his father and mother.   Review of Systems: Please see the history of present illness.   All other systems are reviewed and negative.   Physical Exam: VS:  There were no vitals taken for this visit. Marland Kitchen  BMI There is no height or weight on file to calculate BMI.  Wt Readings from Last 3 Encounters:  06/27/19 174 lb (78.9 kg)  05/23/19 170 lb (77.1 kg)  05/15/19 169 lb 9.6 oz (76.9 kg)    General: Pleasant. Well developed, well nourished and in no acute distress.   HEENT: Normal.  Neck: Supple, no JVD, carotid bruits, or masses noted.  Cardiac: ***Regular rate and rhythm. No murmurs, rubs, or gallops. No edema.  Respiratory:  Lungs are clear to auscultation bilaterally with normal work of breathing.  GI: Soft and nontender.  MS: No deformity or atrophy. Gait and ROM intact.  Skin: Warm and dry. Color is normal.  Neuro:  Strength and sensation are intact and no gross focal deficits noted.  Psych: Alert, appropriate and with normal affect.   LABORATORY DATA:  EKG:  EKG {ACTION; IS/IS ZOX:[id number]} ordered today. This demonstrates ***.  Lab Results  Component Value Date   WBC 4.7 06/27/2019   HGB 11.0 (L) 06/27/2019   HCT 34.2 (L) 06/27/2019   PLT 286 06/27/2019   GLUCOSE 82 06/27/2019     CHOL 81 (L) 05/23/2019   TRIG 46 05/23/2019   HDL 44 05/23/2019   LDLCALC 25 05/23/2019   ALT 9 06/27/2019   AST 19 06/27/2019   NA 140 06/27/2019   K 3.8 06/27/2019   CL 101 06/27/2019   CREATININE 1.35 (H) 06/27/2019   BUN 16 06/27/2019   CO2 25 06/27/2019   TSH 1.44 10/18/2018   PSA 3.62 03/06/2019   INR 1.0 05/12/2019   HGBA1C 5.3 04/29/2019     BNP (last 3 results) Recent Labs    04/29/19 0623  BNP 12.0    ProBNP (last 3 results) No results for input(s): PROBNP in the last 8760 hours.   Other Studies Reviewed Today:  14 day Monitor Study Highlights 06/2019   Normal sinus rhythm as the basic mechanism.  Occasional PVCs are noted. No PACs are noted.  Intermittent AV block, both type I and type II Mobitz are seen during sleep with pauses up to 5 seconds.  Nonsustained VT, 5 beats not associated with symptoms.  No atrial fibrillation or flutter.  Complaints correlate with both PACs and PVCs.    LEFT HEART CATH AND CORONARY ANGIOGRAPHY 05/2019  Conclusion    Mid LAD lesion is 30% stenosed.  Dist LAD lesion is 90% stenosed.  1st Diag lesion is 100% stenosed.  Ramus lesion is 25% stenosed.  Lat Ramus lesion is 90% stenosed.  Mid Cx lesion is 70% stenosed.  Lat 2nd Mrg lesion is 99% stenosed.  Prox RCA to Mid RCA lesion is 20% stenosed.  RPAV lesion is 35% stenosed.  LV end diastolic pressure is normal.   1. CAD. Patient has multiple obstructive lesions in small branches including distal LAD, first diagonal, sub-branch of the ramus intermediate, and sub-branch of OM2. The ramus intermediate stent is still widely patent at prior angioplasty site. There is a moderate stenosis in the mid to distal LCx that involves a bifurcation. 2. Normal LVEDP  Plan: I do not see any good targets for PCI and feel his symptoms are consistent with his small vessel disease. I would recommend aggressive anti-anginal medical therapy.    CORONARY BALLOON  ANGIOPLASTY 04/2019  Conclusion    Dist LAD lesion is 90% stenosed.  1st Diag lesion is 100% stenosed.  Ramus lesion is 90% stenosed.  Post intervention, there is a 25% residual stenosis.   1. Successful cutting balloon angioplasty of the ramus intermediate artery for in stent restenosis.  Plan; DAPT for one year for ACS indication. If patient has  restenosis again could consider repeat stenting.      ASSESSMENT AND PLAN:  1.  Stable angina, thought to be small vessel disease after recath with patent PCI site.   Unable to take IMDUR so will add ranaexa to see if this will allow more activity and less pain.  Will keep on ARB  2.  CAD with PCI, on ASA and plavix.  Continue for stent patency.  3.  Palpitations symptomatic with angina, will check 14 day zio patch.  Follow up in 3 weeks.   4.  HLD on crestor and repatha checked hepatic and lipids 12/12 with LDL 27   5.  Apical variant hypertrophic cardiomyopathy  For cardiac MRI.  6.  Tobacco use, down to 3 cigarettes per day. Plans to stop   . COVID-19 Education: The signs and symptoms of COVID-19 were discussed with the patient and how to seek care for testing (follow up with PCP or arrange E-visit).  The importance of social distancing, staying at home, hand hygiene and wearing a mask when out in public were discussed today.  Current medicines are reviewed with the patient today.  The patient does not have concerns regarding medicines other than what has been noted above.  The following changes have been made:  See above.  Labs/ tests ordered today include:   No orders of the defined types were placed in this encounter.    Disposition:   FU with *** in {gen number 3-55:732202} {Days to years:10300}.   Patient is agreeable to this plan and will call if any problems develop in the interim.   SignedNorma Fredrickson, NP  07/03/2019 3:15 PM  [facility] [address] [place], [place]  [address] Phone: [phone] Fax: [phone]

## 2019-07-03 ENCOUNTER — Ambulatory Visit: Payer: Self-pay | Admitting: Nurse Practitioner

## 2019-07-06 MED FILL — RANEXA ER 500 MG TABLET: 500 | 30 days supply | Qty: 60 | Fill #1

## 2019-07-30 MED FILL — LOSARTAN-HCTZ 100-25 MG TAB: 100-25 | 30 days supply | Qty: 30 | Fill #2

## 2019-08-21 MED FILL — CLOPIDOGREL 75 MG TABLET: 75 | 30 days supply | Qty: 30 | Fill #0

## 2019-08-21 MED FILL — RANOLAZINE ER 500 MG TABLET: 500 | 30 days supply | Qty: 60 | Fill #2

## 2019-09-03 MED FILL — LOSARTAN-HCTZ 100-25 MG TAB: 100-25 | 30 days supply | Qty: 30 | Fill #3

## 2019-09-24 MED FILL — CLOPIDOGREL 75 MG TABLET: 75 | 30 days supply | Qty: 30 | Fill #1

## 2019-09-25 MED FILL — ROSUVASTATIN CALCIUM 40 MG: 40 | 30 days supply | Qty: 30 | Fill #0

## 2019-10-25 ENCOUNTER — Telehealth: Payer: Self-pay | Admitting: Family Medicine

## 2019-10-25 ENCOUNTER — Ambulatory Visit: Payer: Self-pay | Admitting: Family Medicine

## 2019-10-25 NOTE — Telephone Encounter (Signed)
LVM to call back to reschedule the appt since PCP is out the office 

## 2019-10-26 ENCOUNTER — Ambulatory Visit: Payer: Self-pay | Admitting: Family Medicine

## 2019-10-26 MED FILL — RANOLAZINE ER 500 MG TABLET: 500 | 30 days supply | Qty: 60 | Fill #3

## 2019-11-12 NOTE — Progress Notes (Signed)
Virtual Visit via Telephone Note  I connected with [name], on 11/13/2019 at 11:06 AM by telephone due to the COVID-19 pandemic and verified that I am speaking with the correct person using two identifiers.  Due to current restrictions/limitations of in-office visits due to the COVID-19 pandemic, this scheduled clinical appointment was converted to a telehealth visit.   Consent: I discussed the limitations, risks, security and privacy concerns of performing an evaluation and management service by telephone and the availability of in person appointments. I also discussed with the patient that there may be a patient responsible charge related to this service. The patient expressed understanding and agreed to proceed.  Location of Patient: Home  Location of Provider: [place]   Persons participating in Telemedicine visit: [name] Stamps Rosemarie Galvis Minette Brine, NP [name], [name]  History of Present Illness:  Patient ID: [name], male    DOB: Nov 28, 1960  MRN: [id number]  CC: Hypertension follow-up  Subjective: [name] is a 59 y.o. male with history of essential hypertension, apical variant hypertrophic cardiomyopathy, non-ST elevated myocardial infarction, AV block, unstable angina who presents for chronic disease follow-up.  1. HYPERTENSION FOLLOW-UP: Currently taking: see medication list Med Adherence: [x] Yes    [] No Medication side effects: [] Yes    [x] No Adherence with salt restriction: [x] Yes    [] No Exercise: Yes [x] No [] Home Monitoring?: [] Yes    [x] No Monitoring Frequency: [] Yes    [x] No Home BP results range: [] Yes    [x] No Smoking [x] Yes, 10 - 12 cigarettes daily SOB? [] Yes    [x] No Chest Pain?: [x] Yes, especially in hot weather Leg swelling?: [] Yes    [x] No Headaches?: [] Yes    [x] No Dizziness? [] Yes    [x] No Comments: Last visit 06/27/2019 with Dr. Chapman Fitch. During that encounter blood pressure  well-controlled. Losartan-Hydrochlorothiazide continued.  2. CAD FOLLOW-UP: Symptoms:  chest pain occasionally especially if the weather is hot and takes Nitroglycerin only as needed the last time being about 4 months ago, palpitations denies, SOB denies Med Compliance: Current Medications:   Antiplatelet agent:  [x] yes [] no   Beta Blocker: [] yes [x] no   Ace inhibitor:  [] yes [x] no   Statin  [x] yes [] no   Last visit 06/27/2019 with Dr. Chapman Fitch. During that encounter CBC and CMP obtained. Crestor, Ranexa, Nitroglycerin, Evolocumab, and Clopidogrel continued.   3. HYPERLIPIDEMIA FOLLOW-UP: Last Lipid Panel results:  HDL  Date Value Ref Range Status  05/23/2019 44 >39 mg/dL Final   Triglycerides  Date Value Ref Range Status  05/23/2019 46 0 - 149 mg/dL Final    Low-fat diet: Yes Med Adherence: [x] Yes    [] No Medication side effects: [] Yes    [x] No Muscle aches:  [] Yes    [x] No Diet Adherence: [x] Yes    [] No Comments: Last visit 06/27/2019 with Dr. Chapman Fitch. During that encounter CMP obtained. Crestor and Evolocumab continued.  4. RENAL INSUFFICIENCY FOLLOW-UP: Last visit 06/27/2019 with Dr. Chapman Fitch. During that encounter importance of control of blood pressure discussed. CBC and CMP obtained.  Past Medical History:  Diagnosis Date  . Apical variant hypertrophic cardiomyopathy ([place])   . CAD (coronary artery disease)    a. LHC in 2011 with severe dz in D1/D2 and very distal LAD--> too small for PCI and managed medically. b.  LHC  in 10/2013 after an abnormal nuclear study w/ non-obst dz/c. 01/2016 NSTEMI 2.5 x 38 mm Promus DES to Ramus    . Chronic back pain   . 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  Marland Kitchen HOH (hard of  hearing)    rgiht ear  . Hyperlipidemia   . Hypertension   . Migraine headache   . Stroke [facility])    Allergies  Allergen Reactions  . Lisinopril Swelling    Facial swelling    Current Outpatient Medications on File Prior to Visit  Medication Sig Dispense Refill  . aspirin 81 MG chewable tablet Chew 1 tablet (81 mg total) by mouth daily.    . Azilsartan-Chlorthalidone (EDARBYCLOR) 40-25 MG TABS Take 1 tablet by mouth daily. (Patient not taking: Reported on 06/27/2019) 90 tablet 3  . clopidogrel (PLAVIX) 75 MG tablet Take 1 tablet (75 mg total) by mouth daily. 90 tablet 3  . Evolocumab (REPATHA SURECLICK) 449 MG/ML SOAJ Inject 140 mg into the skin every 14 (fourteen) days. 1 mL 11  . losartan-hydrochlorothiazide (HYZAAR) 100-25 MG tablet Take 1 tablet by mouth daily. 90 tablet 3  . nitroGLYCERIN (NITROSTAT) 0.4 MG SL tablet Place 1 tablet (0.4 mg total) under the tongue every 5 (five) minutes as needed for chest pain. 25 tablet 3  . ranolazine (RANEXA) 500 MG 12 hr tablet Take 1 tablet (500 mg total) by mouth 2 (two) times daily. 180 tablet 3  . rosuvastatin (CRESTOR) 40 MG tablet Take 1 tablet (40 mg total) by mouth daily. 90 tablet 1  . Tetrahydrozoline HCl (VISINE OP) Place 1 drop into both eyes daily as needed (dry eyes).    . thiamine ([employer] B-1) 100 MG tablet Take 1 tablet (100 mg total) by mouth every other day. 45 tablet 1   No current facility-administered medications on file prior to visit.    Observations/Objective: Alert and oriented x 3. Not in acute distress. Physical examination not completed as this is a telemedicine visit.  Assessment and Plan: 1. Essential hypertension: -Continue Losartan-Hydrochlorothiazide as prescribed. -Patient has refills on file to last until January 2022. Last refilled by his primary physician on 06/27/2019. -Counseled on blood pressure goal of less than 130/80, low-sodium, DASH diet, medication compliance, 150 minutes of moderate intensity  exercise per week. Discussed medication compliance, adverse effects. -CMP to evaluate kidney function, liver function, and electrolyte balance. -CBC to evaluate blood count. -Follow up with primary physician in 3 months or sooner if needed. - CMP14+EGFR; Future - CBC With Differential; Future  2. Coronary artery disease involving native coronary artery of native heart with angina pectoris [facility]): -Continue Rosuvastatin, Ranolazine, Nitroglycerin, Evolocumab, and Clopidrogel as prescribed.  -Patient has refills for Ranolazine on file to last until January 2022. Last refilled by his primary physician on 06/27/2019. -Patient has Nitroglycerin at home and refills on file. Last refilled by his primary physician on 06/27/2019. -Patient has refills for Evolocumab on file to last until January 2022. Last refilled by his primary physician on 06/27/2019. -Patient has refills for Clopidrogel on file to last until January 2022. Last refilled by his primary physician on 06/27/2019. -Continue Rosuvastatin as prescribed. -Lipid panel to evaluate cholesterol levels. -Keep scheduled appointments with Cardiology. -Follow-up with primary physician  in 3 months or sooner if needed. - Lipid Panel; Future - rosuvastatin (CRESTOR) 40 MG tablet; Take 1 tablet (40 mg total) by mouth daily.  Dispense: 90 tablet; Refill: 0  3. Hyperlipidemia, unspecified hyperlipidemia type: -Continue Rosuvastatin as prescribed. -Lipid panel to evaluate cholesterol levels. -Keep scheduled appointments with Cardiology. -Follow-up with primary physician in 3 months or sooner if needed. - rosuvastatin (CRESTOR) 40 MG tablet; Take 1 tablet (40 mg total) by mouth daily.  Dispense: 90 tablet; Refill: 0 - Lipid Panel;Future  4. Long term (current) use of antithrombotics/antiplatelets: -CBC to evaluate blood count in follow-up of use of anti-thrombotic and anti-platelet medication. - CBC With Differential; Future  5. Long-term use of  high-risk medication: -CMP to evaluate kidney function, liver function, and electrolyte balance in follow-up of long-term use of high-risk medications for the treatment of hypertension and hyperlipidemia. - CMP14+EGFR; Future  6. Chronic renal insufficiency, stage II (mild): -Counseled patient the importance of control of blood pressure. -CMP to evaluate kidney function, liver function, and electrolyte balance.  -CBC to evaluate blood count. - CMP14+EGFR; Future - CBC With Differential; Future  Follow Up Instructions: Follow-up with primary physician in 3 months or sooner if needed. Keep scheduled appointments with Cardiology.   Patient was given clear instructions to go to Emergency Department or return to medical center if symptoms don't improve, worsen, or new problems develop.The patient verbalized understanding.  I discussed the assessment and treatment plan with the patient. The patient was provided an opportunity to ask questions and all were answered. The patient agreed with the plan and demonstrated an understanding of the instructions.   The patient was advised to call back or seek an in-person evaluation if the symptoms worsen or if the condition fails to improve as anticipated.  I provided 7 minutes total of non-face-to-face time during this encounter including median intraservice time, reviewing previous notes, labs, imaging, medications, management and patient verbalized understanding.  Camillia Herter, NP  [facility] and [facility], [place]   11/13/2019, 11:16 AM

## 2019-11-13 ENCOUNTER — Other Ambulatory Visit: Payer: Self-pay | Admitting: Family

## 2019-11-13 ENCOUNTER — Ambulatory Visit: Payer: Self-pay | Attending: Family | Admitting: Family

## 2019-11-13 DIAGNOSIS — I10 Essential (primary) hypertension: Secondary | ICD-10-CM

## 2019-11-13 DIAGNOSIS — Z7902 Long term (current) use of antithrombotics/antiplatelets: Secondary | ICD-10-CM

## 2019-11-13 DIAGNOSIS — E785 Hyperlipidemia, unspecified: Secondary | ICD-10-CM

## 2019-11-13 DIAGNOSIS — N182 Chronic kidney disease, stage 2 (mild): Secondary | ICD-10-CM

## 2019-11-13 DIAGNOSIS — Z79899 Other long term (current) drug therapy: Secondary | ICD-10-CM

## 2019-11-13 DIAGNOSIS — I25119 Atherosclerotic heart disease of native coronary artery with unspecified angina pectoris: Secondary | ICD-10-CM

## 2019-11-13 MED ORDER — ROSUVASTATIN CALCIUM 40 MG PO TABS: 40.0000 mg | ORAL_TABLET | Freq: Every day | ORAL | 0 refills | Status: DC

## 2019-11-13 NOTE — Patient Instructions (Signed)
Continue all medications as prescribed for high blood pressure, coronary artery disease, and high cholesterol. Labs on this week, please come in to the office for this appointment. Keep appointments with Cardiology. Follow-up with primary physician in 3 months or sooner if needed. Hypertension, Adult Hypertension is another name for high blood pressure. High blood pressure forces your heart to work harder to pump blood. This can cause problems over time. There are two numbers in a blood pressure reading. There is a top number (systolic) over a bottom number (diastolic). It is best to have a blood pressure that is below 120/80. Healthy choices can help lower your blood pressure, or you may need medicine to help lower it. What are the causes? The cause of this condition is not known. Some conditions may be related to high blood pressure. What increases the risk?  Smoking.  Having type 2 diabetes mellitus, high cholesterol, or both.  Not getting enough exercise or physical activity.  Being overweight.  Having too much fat, sugar, calories, or salt (sodium) in your diet.  Drinking too much alcohol.  Having long-term (chronic) kidney disease.  Having a family history of high blood pressure.  Age. Risk increases with age.  Race. You may be at higher risk if you are African American.  Gender. Men are at higher risk than women before age 15. After age 68, women are at higher risk than men.  Having obstructive sleep apnea.  Stress. What are the signs or symptoms?  High blood pressure may not cause symptoms. Very high blood pressure (hypertensive crisis) may cause: ? Headache. ? Feelings of worry or nervousness (anxiety). ? Shortness of breath. ? Nosebleed. ? A feeling of being sick to your stomach (nausea). ? Throwing up (vomiting). ? Changes in how you see. ? Very bad chest pain. ? Seizures. How is this treated?  This condition is treated by making healthy lifestyle changes,  such as: ? Eating healthy foods. ? Exercising more. ? Drinking less alcohol.  Your health care provider may prescribe medicine if lifestyle changes are not enough to get your blood pressure under control, and if: ? Your top number is above 130. ? Your bottom number is above 80.  Your personal target blood pressure may vary. Follow these instructions at home: Eating and drinking   If told, follow the DASH eating plan. To follow this plan: ? Fill one half of your plate at each meal with fruits and vegetables. ? Fill one fourth of your plate at each meal with whole grains. Whole grains include whole-wheat pasta, brown rice, and whole-grain bread. ? Eat or drink low-fat dairy products, such as skim milk or low-fat yogurt. ? Fill one fourth of your plate at each meal with low-fat (lean) proteins. Low-fat proteins include fish, chicken without skin, eggs, beans, and tofu. ? Avoid fatty meat, cured and processed meat, or chicken with skin. ? Avoid pre-made or processed food.  Eat less than 1,500 mg of salt each day.  Do not drink alcohol if: ? Your doctor tells you not to drink. ? You are pregnant, may be pregnant, or are planning to become pregnant.  If you drink alcohol: ? Limit how much you use to:  0-1 drink a day for women.  0-2 drinks a day for men. ? Be aware of how much alcohol is in your drink. In the [place] one drink equals one 12 oz bottle of beer (355 mL), one 5 oz glass of wine (148 mL), or one 1  oz glass of hard liquor (44 mL). Lifestyle   Work with your doctor to stay at a healthy weight or to lose weight. Ask your doctor what the best weight is for you.  Get at least 30 minutes of exercise most days of the week. This may include walking, swimming, or biking.  Get at least 30 minutes of exercise that strengthens your muscles (resistance exercise) at least 3 days a week. This may include lifting weights or doing Pilates.  Do not use any products that contain  nicotine or tobacco, such as cigarettes, e-cigarettes, and chewing tobacco. If you need help quitting, ask your doctor.  Check your blood pressure at home as told by your doctor.  Keep all follow-up visits as told by your doctor. This is important. Medicines  Take over-the-counter and prescription medicines only as told by your doctor. Follow directions carefully.  Do not skip doses of blood pressure medicine. The medicine does not work as well if you skip doses. Skipping doses also puts you at risk for problems.  Ask your doctor about side effects or reactions to medicines that you should watch for. Contact a doctor if you:  Think you are having a reaction to the medicine you are taking.  Have headaches that keep coming back (recurring).  Feel dizzy.  Have swelling in your ankles.  Have trouble with your vision. Get help right away if you:  Get a very bad headache.  Start to feel mixed up (confused).  Feel weak or numb.  Feel faint.  Have very bad pain in your: ? Chest. ? Belly (abdomen).  Throw up more than once.  Have trouble breathing. Summary  Hypertension is another name for high blood pressure.  High blood pressure forces your heart to work harder to pump blood.  For most people, a normal blood pressure is less than 120/80.  Making healthy choices can help lower blood pressure. If your blood pressure does not get lower with healthy choices, you may need to take medicine. This information is not intended to replace advice given to you by your health care provider. Make sure you discuss any questions you have with your health care provider. Document Revised: 01/25/2018 Document Reviewed: 01/25/2018 Elsevier Patient Education  2020 [employer].

## 2019-11-21 MED FILL — LOSARTAN-HCTZ 100-25 MG TAB: 100-25 | 30 days supply | Qty: 30 | Fill #5

## 2019-12-17 MED FILL — ROSUVASTATIN CALCIUM 40 MG: 40 | 30 days supply | Qty: 30 | Fill #2

## 2019-12-17 MED FILL — CLOPIDOGREL 75 MG TABLET: 75 | 30 days supply | Qty: 30 | Fill #3

## 2019-12-31 MED FILL — LOSARTAN-HCTZ 100-25 MG TAB: 100-25 | 30 days supply | Qty: 30 | Fill #6

## 2020-01-21 MED FILL — RANOLAZINE ER 500 MG TABLET: 500 | 30 days supply | Qty: 60 | Fill #4

## 2020-01-29 ENCOUNTER — Telehealth: Payer: Self-pay

## 2020-01-29 ENCOUNTER — Telehealth: Payer: Self-pay | Admitting: Interventional Cardiology

## 2020-01-29 DIAGNOSIS — E785 Hyperlipidemia, unspecified: Secondary | ICD-10-CM

## 2020-01-29 DIAGNOSIS — I25119 Atherosclerotic heart disease of native coronary artery with unspecified angina pectoris: Secondary | ICD-10-CM

## 2020-01-29 MED ORDER — REPATHA SURECLICK 140 MG/ML ~~LOC~~ SOAJ: 140.0000 mg | SUBCUTANEOUS | 0 refills | Status: AC

## 2020-01-29 NOTE — Telephone Encounter (Signed)
New message:      Patient wife calling concering patient repatha and would like for some one to call her concering this matter.

## 2020-01-29 NOTE — Telephone Encounter (Signed)
Patient called, will be out of Repatha this month.  Has appointment with Dr Katrinka Blazing on 9/23.  Will send 1 month refill until patient can be seen

## 2020-01-29 NOTE — Telephone Encounter (Signed)
Left voicemail for patient today to contact [employer] regarding enrollment for Repatha and refills at [phone].  Looks like pt is enrolled thru 02/2020 and will have to get med thru [employer]/re-enroll for assistance.

## 2020-01-29 NOTE — Telephone Encounter (Signed)
I think this was routed to preop in error - routing back to [facility] in case it was intended to go elsewhere.

## 2020-01-29 NOTE — Telephone Encounter (Signed)
Left message on machine asking for call back

## 2020-02-01 MED FILL — ROSUVASTATIN CALCIUM 40 MG: 40 | 30 days supply | Qty: 30 | Fill #3

## 2020-02-01 MED FILL — CLOPIDOGREL 75 MG TABLET: 75 | 30 days supply | Qty: 30 | Fill #4

## 2020-02-18 MED FILL — LOSARTAN-HCTZ 100-25 MG TAB: 100-25 | 30 days supply | Qty: 30 | Fill #7

## 2020-02-20 NOTE — Progress Notes (Signed)
Cardiology Office Note:    Date:  02/21/2020   ID:  [name], DOB 1961-05-25, MRN [id number]  PCP:  Cain Saupe, MD  Cardiologist:  [name], MD   Referring MD: Cain Saupe, MD   Chief Complaint  Patient presents with  . Coronary Artery Disease  . Advice Only    HypertrophicCM    History of Present Illness:    [name] is a 59 y.o. male with a hx of CAD(recent cutting balloon angioplasty of the ramus intermedius on 04/30/2019),apical variant hypertrophic cardiomyopathy, GERD, chronic renal insufficiency, chronic back pain, hyperlipidemia(on rosuvastatin and Repatha), hypertension, tobacco abuse,prior CVA and migraine headaches  Has recurring angina with possible component of microvascular disease suspected.  Anginal pattern improved since stenting but still frequent and somewhat limiting.  He is still working as a Nutritional therapist.  Walking 30 to 40 yards will sometimes cause chest discomfort.  He works within the constraints of anginal symptoms.  Nitroglycerin use is rare as chest discomfort resolves quickly.  He denies orthopnea, PND, palpitations, and syncope.  There is no peripheral edema.  Past Medical History:  Diagnosis Date  . Apical variant hypertrophic cardiomyopathy (HCC)   . CAD (coronary artery disease)    a. LHC in 2011 with severe dz in D1/D2 and very distal LAD--> too small for PCI and managed medically. b.  LHC in 10/2013 after an abnormal nuclear study w/ non-obst dz/c. 01/2016 NSTEMI 2.5 x 38 mm Promus DES to Ramus    . Chronic back pain   . 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  Marland Kitchen HOH (hard of hearing)    rgiht ear  . Hyperlipidemia   . Hypertension   . Migraine headache   . Stroke  [facility])     Past Surgical History:  Procedure Laterality Date  . CARDIAC CATHETERIZATION N/A 02/24/2016   Procedure: Left Heart Cath and Coronary Angiography;  Surgeon: Tonny Bollman, MD;  Location: [facility] INVASIVE CV LAB;  Service: Cardiovascular;  Laterality: N/A;  . CARDIAC CATHETERIZATION N/A 02/24/2016   Procedure: Coronary Stent Intervention;  Surgeon: Tonny Bollman, MD;  Location: [facility] INVASIVE CV LAB;  Service: Cardiovascular;  Laterality: N/A;  . CORONARY ANGIOPLASTY WITH STENT PLACEMENT  2017  . CORONARY BALLOON ANGIOPLASTY N/A 04/30/2019   Procedure: CORONARY BALLOON ANGIOPLASTY;  Surgeon: Swaziland, Peter M, MD;  Location: [facility] INVASIVE CV LAB;  Service: Cardiovascular;  Laterality: N/A;  . INGUINAL HERNIA REPAIR    . LEFT HEART CATH AND CORONARY ANGIOGRAPHY N/A 04/30/2019   Procedure: LEFT HEART CATH AND CORONARY ANGIOGRAPHY;  Surgeon: Dolores Patty, MD;  Location: [facility] INVASIVE CV LAB;  Service: Cardiovascular;  Laterality: N/A;  . LEFT HEART CATH AND CORONARY ANGIOGRAPHY N/A 05/14/2019   Procedure: LEFT HEART CATH AND CORONARY ANGIOGRAPHY;  Surgeon: Swaziland, Peter M, MD;  Location: [facility] INVASIVE CV LAB;  Service: Cardiovascular;  Laterality: N/A;  . LEFT HEART CATHETERIZATION WITH CORONARY ANGIOGRAM N/A 11/22/2013   Procedure: LEFT HEART CATHETERIZATION WITH CORONARY ANGIOGRAM;  Surgeon: [name], MD;  Location: [facility] CATH LAB;  Service: Cardiovascular;  Laterality: N/A;  . TEE WITHOUT CARDIOVERSION N/A 08/22/2015   Procedure: TRANSESOPHAGEAL ECHOCARDIOGRAM (TEE);  Surgeon: Vesta Mixer, MD;  Location: [facility] ENDOSCOPY;  Service: Cardiovascular;  Laterality: N/A;    Current Medications: Current Meds  Medication Sig  . aspirin 81 MG chewable tablet Chew 1 tablet (81 mg total) by mouth daily.  . clopidogrel (PLAVIX) 75 MG tablet Take 1 tablet (75 mg total) by mouth daily.  . Evolocumab (REPATHA SURECLICK) 140 MG/ML SOAJ Inject 140 mg into the skin every 14 (fourteen) days for 28 days.   Marland Kitchen losartan-hydrochlorothiazide (HYZAAR) 100-25 MG tablet Take 1 tablet by mouth daily.  . nitroGLYCERIN (NITROSTAT) 0.4 MG SL tablet Place 1 tablet (0.4 mg total) under the tongue every 5 (five) minutes as needed for chest pain.  . ranolazine (RANEXA) 500 MG 12 hr tablet Take 1 tablet (500 mg total) by mouth 2 (two) times daily.  . rosuvastatin (CRESTOR) 40 MG tablet Take 1 tablet (40 mg total) by mouth daily.  Marland Kitchen thiamine ([facility] B-1) 100 MG tablet Take 1 tablet (100 mg total) by mouth every other day.     Allergies:   Lisinopril   Social History   Socioeconomic History  . Marital status: Married    Spouse name: Not on file  . Number of children: Not on file  . Years of education: Not on file  . Highest education level: Not on file  Occupational History  . Not on file  Tobacco Use  . Smoking status: Current Some Day Smoker    Packs/day: 0.00    Types: Cigarettes  . Smokeless tobacco: Never Used  Vaping Use  . Vaping Use: Never used  Substance and Sexual Activity  . Alcohol use: No    Alcohol/week: 0.0 standard drinks  . Drug use: No  . Sexual activity: Not on file  Other Topics Concern  . Not on file  Social History Narrative  . Not on file   Social Determinants of Health   Financial Resource Strain:   . Difficulty of Paying Living Expenses: Not on file  Food Insecurity:   . Worried About Programme researcher, broadcasting/film/video in the Last Year: Not on file  . Ran Out of Food in the Last Year: Not on file  Transportation Needs:   . Lack of Transportation (Medical): Not on file  . Lack of Transportation (Non-Medical): Not on file  Physical Activity:   . Days of Exercise per Week: Not on file  . Minutes of Exercise per Session: Not on file  Stress:   . Feeling of Stress : Not on file  Social Connections:   . Frequency of Communication with Friends and Family: Not on file  . Frequency of Social Gatherings with Friends and Family: Not on file  . Attends Religious Services: Not on file    . Active Member of Clubs or Organizations: Not on file  . Attends Banker Meetings: Not on file  . Marital Status: Not on file     Family History: The patient's family history includes Colon cancer in his paternal uncle; Colon cancer (age of onset: 58) in his father; Heart failure in his mother; Hypertension in his father and mother.  ROS:   Please see the history of present illness.    Having right arm tingling and numbness when he sits or lays in certain positions.  Also has stiffness and discomfort in his neck.  All other systems reviewed and are negative.  EKGs/Labs/Other Studies Reviewed:    The following studies were reviewed today: No new imaging data.  Last coronary angiogram was December 2020.  January 2021 long-term monitor: Study Highlights  Normal sinus rhythm as the basic mechanism.  Occasional PVCs are noted. No PACs are noted.  Intermittent AV block, both type I and type II Mobitz are seen during sleep with pauses up to 5 seconds.  Nonsustained VT, 5 beats not associated with symptoms.  No atrial fibrillation or flutter.  Complaints correlate with both PACs and PVCs.    EKG:  EKG sinus rhythm, left atrial abnormality, left ventricular hypertrophy with strain.  When compared to prior EKG from December 2020 repolarization abnormality more widespread on today's EKG.  Recent Labs: 04/29/2019: B Natriuretic Peptide 12.0 06/27/2019: ALT 9; BUN 16; Creatinine, Ser 1.35; Hemoglobin 11.0; Platelets 286; Potassium 3.8; Sodium 140  Recent Lipid Panel    Component Value Date/Time   CHOL 81 (L) 05/23/2019 0800   TRIG 46 05/23/2019 0800   HDL 44 05/23/2019 0800   CHOLHDL 1.8 05/23/2019 0800   CHOLHDL 1.8 05/12/2019 0317   VLDL 7 05/12/2019 0317   LDLCALC 25 05/23/2019 0800    Physical Exam:    VS:  BP 128/82   Pulse 70   Ht 5\' 8"  (1.727 m)   Wt 181 lb (82.1 kg)   SpO2 98%   BMI 27.52 kg/m     Wt Readings from Last 3 Encounters:  02/21/20  181 lb (82.1 kg)  06/27/19 174 lb (78.9 kg)  05/23/19 170 lb (77.1 kg)     GEN: Healthy-appearing. No acute distress HEENT: Normal NECK: No JVD. LYMPHATICS: No lymphadenopathy CARDIAC:  RRR without murmur, gallop, or edema. VASCULAR:  Normal Pulses. No bruits. RESPIRATORY:  Clear to auscultation without rales, wheezing or rhonchi  ABDOMEN: Soft, non-tender, non-distended, No pulsatile mass, MUSCULOSKELETAL: No deformity  SKIN: Warm and dry NEUROLOGIC:  Alert and oriented x 3 PSYCHIATRIC:  Normal affect   ASSESSMENT:    1. 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 Hyperlipidemia, unspecified hyperlipidemia type   3. Apical variant hypertrophic cardiomyopathy (HCC)   4. Tobacco abuse   5. Essential hypertension   6. Educated about COVID-19 virus infection    PLAN:    In order of problems listed above:  1. Secondary prevention discussed.  He is getting greater than 150 minutes of moderate activity per week.  He is living within the confines of anginal symptoms.  Since I will not be seeing him for another 6 months I did caution him that cold weather will cause angina to occur quicker.  He should not force physical activity if his chest is uncomfortable.  Continue Ranexa, Plavix, and aspirin.  We will switch to Plavix monotherapy 1 year after most recent stent procedure. 2. He is on Repatha and statin therapy with LDL of twenty-five.  Continue those medications as listed. 3. Blood pressure control is important.  Family history of vascular disease involving his mother, father, and brother all of whom have had strokes and heart attacks.  Brother is younger than he is.  EKG is markedly abnormal and reflection of apical hypertrophic cardiomyopathy. 4. Now not smoking 5. Continue current therapy which includes Hyzaar 100/25 mg/day.  Low-salt diet recommended. 6. Vaccinated and practicing medication.  Overall education and  awareness concerning primary/secondary risk prevention was discussed in detail: LDL less than 70, hemoglobin A1c less than 7, blood pressure target less than 130/80 mmHg, >150 minutes of moderate aerobic activity per week, avoidance of smoking, weight control (via diet and exercise), and continued surveillance/management of/for obstructive sleep apnea.    Medication  Adjustments/Labs and Tests Ordered: Current medicines are reviewed at length with the patient today.  Concerns regarding medicines are outlined above.  Orders Placed This Encounter  Procedures  . EKG 12-Lead   No orders of the defined types were placed in this encounter.   Patient Instructions  Medication Instructions:  Your physician recommends that you continue on your current medications as directed. Please refer to the Current Medication list given to you today.  *If you need a refill on your cardiac medications before your next appointment, please call your pharmacy*   Lab Work: None If you have labs (blood work) drawn today and your tests are completely normal, you will receive your results only by: Marland Kitchen MyChart Message (if you have MyChart) OR . A paper copy in the mail If you have any lab test that is abnormal or we need to change your treatment, we will call you to review the results.   Testing/Procedures: None   Follow-Up: At [facility], you and your health needs are our priority.  As part of our continuing mission to provide you with exceptional heart care, we have created designated Provider Care Teams.  These Care Teams include your primary Cardiologist (physician) and Advanced Practice Providers (APPs -  Physician Assistants and Nurse Practitioners) who all work together to provide you with the care you need, when you need it.  We recommend signing up for the patient portal called "MyChart".  Sign up information is provided on this After Visit Summary.  MyChart is used to connect with patients for Virtual  Visits (Telemedicine).  Patients are able to view lab/test results, encounter notes, upcoming appointments, etc.  Non-urgent messages can be sent to your provider as well.   To learn more about what you can do with MyChart, go to [email or web address].    Your next appointment:   6 month(s)  The format for your next appointment:   In Person  Provider:   You may see [name], MD or one of the following Advanced Practice Providers on your designated Care Team:    Norma Fredrickson, NP  Nada Boozer, NP  Georgie Chard, NP    Other Instructions      Signed, [name], MD  02/21/2020 9:32 AM     [facility]

## 2020-02-21 ENCOUNTER — Encounter: Payer: Self-pay | Admitting: *Deleted

## 2020-02-21 ENCOUNTER — Other Ambulatory Visit: Payer: Self-pay

## 2020-02-21 ENCOUNTER — Ambulatory Visit (INDEPENDENT_AMBULATORY_CARE_PROVIDER_SITE_OTHER): Payer: Self-pay | Admitting: Interventional Cardiology

## 2020-02-21 ENCOUNTER — Encounter: Payer: Self-pay | Admitting: Interventional Cardiology

## 2020-02-21 VITALS — BP 128/82 | HR 70 | Ht 68.0 in | Wt 181.0 lb

## 2020-02-21 DIAGNOSIS — E785 Hyperlipidemia, unspecified: Secondary | ICD-10-CM

## 2020-02-21 DIAGNOSIS — I25119 Atherosclerotic heart disease of native coronary artery with unspecified angina pectoris: Secondary | ICD-10-CM

## 2020-02-21 DIAGNOSIS — Z720 Tobacco use: Secondary | ICD-10-CM

## 2020-02-21 DIAGNOSIS — Z7189 Other specified counseling: Secondary | ICD-10-CM

## 2020-02-21 DIAGNOSIS — I10 Essential (primary) hypertension: Secondary | ICD-10-CM

## 2020-02-21 DIAGNOSIS — I422 Other hypertrophic cardiomyopathy: Secondary | ICD-10-CM

## 2020-02-21 NOTE — Patient Instructions (Signed)

## 2020-02-26 ENCOUNTER — Telehealth: Payer: Self-pay

## 2020-02-26 NOTE — Telephone Encounter (Signed)
Spoke with wife.  Wife will fill out her section of the application and send it to [place] via myChart

## 2020-02-26 NOTE — Telephone Encounter (Signed)
Spoke with wife and made her aware that pharmacy team will reach out about Repatha application.  She has it filled out.  Said she can drop it off or scan it and email it.    Advised her that I placed copies of the pt's EKGs in the mail the day of pt's appt.  Advised they can be taken to local [employer] and be laminated if pt would like.  Wife appreciative for call.

## 2020-02-26 NOTE — Telephone Encounter (Signed)
Pt's wife is calling stating that pt needs his repatha application filled out by Dr. Katrinka Blazing, so pt can get his medication. Wife would like a call back concerning this matter at [phone], wife name is Rinaldo Cloud. Constance Holster, RN, Dr. Katrinka Blazing nurse, wife wanted to know if pt could get a copy of his EKG, to have on hand, because it was abnormal and they needed to keep a copy with them when they go out of town. Please address

## 2020-02-29 ENCOUNTER — Telehealth: Payer: Self-pay | Admitting: Pharmacist

## 2020-02-29 NOTE — Telephone Encounter (Signed)
Patient assistance paperwork for Repatha faxed to [employer]

## 2020-03-04 NOTE — Telephone Encounter (Signed)
Patient approved for Repatha patient assistance until 03/04/2021.  Called and spoke with wife and she is aware.

## 2020-03-04 NOTE — Telephone Encounter (Signed)
Received letter from [employer] needing further information. Called [employer] and updated application.  Patient's Case ID is [id number]

## 2020-03-10 ENCOUNTER — Ambulatory Visit: Payer: Self-pay | Admitting: Family Medicine

## 2020-03-17 MED FILL — CLOPIDOGREL 75 MG TABLET: 75 | 30 days supply | Qty: 30 | Fill #5

## 2020-03-17 MED FILL — ROSUVASTATIN CALCIUM 40 MG: 40 | 30 days supply | Qty: 30 | Fill #4

## 2020-03-31 MED FILL — LOSARTAN-HCTZ 100-25 MG TAB: 100-25 | 30 days supply | Qty: 30 | Fill #8

## 2020-04-07 ENCOUNTER — Encounter: Payer: Self-pay | Admitting: Family Medicine

## 2020-04-07 ENCOUNTER — Ambulatory Visit: Payer: Self-pay | Attending: Family Medicine | Admitting: Family Medicine

## 2020-04-07 ENCOUNTER — Other Ambulatory Visit: Payer: Self-pay

## 2020-04-07 VITALS — BP 145/85 | HR 71 | Wt 178.0 lb

## 2020-04-07 DIAGNOSIS — R21 Rash and other nonspecific skin eruption: Secondary | ICD-10-CM

## 2020-04-07 MED ORDER — TRIAMCINOLONE ACETONIDE 0.1 % EX CREA: 1.0000 "application " | TOPICAL_CREAM | Freq: Two times a day (BID) | CUTANEOUS | 3 refills | Status: DC

## 2020-04-07 NOTE — Progress Notes (Signed)
Established Patient Office Visit  Subjective:  Patient ID: [name], male    DOB: Jun 19, 1960  Age: 59 y.o. MRN: [id number]  CC:  Chief Complaint  Patient presents with  . Rash    BOTH ARMS    HPI [name], 59 year old male, who presents secondary to complaint of new onset of rash on the bilateral forearms which started about 3 weeks ago.  When patient initially called for the appointment, the rash was itchy but now the skin areas are no longer itchy and have mostly cleared up except for 1 area on the right midforearm which continues to appear abnormal.  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  He has not had a similar rash in the past.  He reports his blood pressure is elevated today because he has missed at least 2 doses of his blood pressure medicine after falling asleep on the couch at night and forgetting to take the medication prior to falling asleep.  Past Medical History:  Diagnosis Date  . Apical variant hypertrophic cardiomyopathy (HCC)   . CAD (coronary artery disease)    a. LHC in 2011 with severe dz in D1/D2 and very distal LAD--> too small for PCI and managed medically. b.  LHC in 10/2013 after an abnormal nuclear study w/ non-obst dz/c. 01/2016 NSTEMI 2.5 x 38 mm Promus DES to Ramus    . Chronic back pain   . 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  Marland Kitchen HOH (hard of hearing)    rgiht ear  . Hyperlipidemia   . Hypertension   . Migraine headache   . Stroke [facility])     Past Surgical History:  Procedure Laterality Date  . CARDIAC CATHETERIZATION N/A  02/24/2016   Procedure: Left Heart Cath and Coronary Angiography;  Surgeon: Tonny Bollman, MD;  Location: [facility] INVASIVE CV LAB;  Service: Cardiovascular;  Laterality: N/A;  . CARDIAC CATHETERIZATION N/A 02/24/2016   Procedure: Coronary Stent Intervention;  Surgeon: Tonny Bollman, MD;  Location: [facility] INVASIVE CV LAB;  Service: Cardiovascular;  Laterality: N/A;  . CORONARY ANGIOPLASTY WITH STENT PLACEMENT  2017  . CORONARY BALLOON ANGIOPLASTY N/A 04/30/2019   Procedure: CORONARY BALLOON ANGIOPLASTY;  Surgeon: Swaziland, Peter M, MD;  Location: [facility] INVASIVE CV LAB;  Service: Cardiovascular;  Laterality: N/A;  . INGUINAL HERNIA REPAIR    . LEFT HEART CATH AND CORONARY ANGIOGRAPHY N/A 04/30/2019   Procedure: LEFT HEART CATH AND CORONARY ANGIOGRAPHY;  Surgeon: Dolores Patty, MD;  Location: [facility] INVASIVE CV LAB;  Service: Cardiovascular;  Laterality: N/A;  . LEFT HEART CATH AND CORONARY ANGIOGRAPHY N/A 05/14/2019   Procedure: LEFT HEART CATH AND CORONARY ANGIOGRAPHY;  Surgeon: Swaziland, Peter M, MD;  Location: [facility] INVASIVE CV LAB;  Service: Cardiovascular;  Laterality: N/A;  . LEFT HEART CATHETERIZATION WITH CORONARY ANGIOGRAM N/A 11/22/2013   Procedure: LEFT HEART CATHETERIZATION WITH CORONARY ANGIOGRAM;  Surgeon: [name], MD;  Location: [facility] CATH LAB;  Service: Cardiovascular;  Laterality: N/A;  . TEE WITHOUT CARDIOVERSION N/A 08/22/2015   Procedure: TRANSESOPHAGEAL ECHOCARDIOGRAM (TEE);  Surgeon: Deloris Ping Nahser,  MD;  Location: [facility] ENDOSCOPY;  Service: Cardiovascular;  Laterality: N/A;    Family History  Problem Relation Age of Onset  . Hypertension Mother   . Heart failure Mother   . Hypertension Father   . Colon cancer Father 36  . Colon cancer Paternal Uncle     Social History   Socioeconomic History  . Marital status: Married    Spouse name: Not on file  . Number of children: Not on file  . Years of education: Not on file  . Highest education level: Not on file  Occupational History    . Not on file  Tobacco Use  . Smoking status: Current Some Day Smoker    Packs/day: 0.00    Types: Cigarettes  . Smokeless tobacco: Never Used  Vaping Use  . Vaping Use: Never used  Substance and Sexual Activity  . Alcohol use: No    Alcohol/week: 0.0 standard drinks  . Drug use: No  . Sexual activity: Not on file  Other Topics Concern  . Not on file  Social History Narrative  . Not on file   Social Determinants of Health   Financial Resource Strain:   . Difficulty of Paying Living Expenses: Not on file  Food Insecurity:   . Worried About Programme researcher, broadcasting/film/video in the Last Year: Not on file  . Ran Out of Food in the Last Year: Not on file  Transportation Needs:   . Lack of Transportation (Medical): Not on file  . Lack of Transportation (Non-Medical): Not on file  Physical Activity:   . Days of Exercise per Week: Not on file  . Minutes of Exercise per Session: Not on file  Stress:   . Feeling of Stress : Not on file  Social Connections:   . Frequency of Communication with Friends and Family: Not on file  . Frequency of Social Gatherings with Friends and Family: Not on file  . Attends Religious Services: Not on file  . Active Member of Clubs or Organizations: Not on file  . Attends Banker Meetings: Not on file  . Marital Status: Not on file  Intimate Partner Violence:   . Fear of Current or Ex-Partner: Not on file  . Emotionally Abused: Not on file  . Physically Abused: Not on file  . Sexually Abused: Not on file    Outpatient Medications Prior to Visit  Medication Sig Dispense Refill  . aspirin 81 MG chewable tablet Chew 1 tablet (81 mg total) by mouth daily.    . clopidogrel (PLAVIX) 75 MG tablet Take 1 tablet (75 mg total) by mouth daily. 90 tablet 3  . losartan-hydrochlorothiazide (HYZAAR) 100-25 MG tablet Take 1 tablet by mouth daily. 90 tablet 3  . ranolazine (RANEXA) 500 MG 12 hr tablet Take 1 tablet (500 mg total) by mouth 2 (two) times daily.  180 tablet 3  . rosuvastatin (CRESTOR) 40 MG tablet Take 1 tablet (40 mg total) by mouth daily. 90 tablet 0  . thiamine ([facility]S B-1) 100 MG tablet Take 1 tablet (100 mg total) by mouth every other day. 45 tablet 1  . nitroGLYCERIN (NITROSTAT) 0.4 MG SL tablet Place 1 tablet (0.4 mg total) under the tongue every 5 (five) minutes as needed for chest pain. 25 tablet 3   No facility-administered medications prior to visit.    Allergies  Allergen Reactions  . Lisinopril Swelling    Facial swelling    ROS Review of Systems  Constitutional: Negative for chills  and fever.  Skin: Positive for rash. Negative for wound.  Hematological: Negative for adenopathy. Does not bruise/bleed easily.      Objective:    Physical Exam Constitutional:      General: He is not in acute distress.    Appearance: Normal appearance.  Cardiovascular:     Rate and Rhythm: Normal rate and regular rhythm.  Pulmonary:     Effort: Pulmonary effort is normal.     Breath sounds: Normal breath sounds.  Skin:    General: Skin is warm and dry.     Comments: Patient with shiny appearing, hyperpigmented plaque on the right mid forearm  Neurological:     Mental Status: He is alert.     BP (!) 157/89 (BP Location: Left Arm, Patient Position: Sitting)   Pulse 71   Wt 178 lb (80.7 kg)   SpO2 100%   BMI 27.06 kg/m   BP (!) 145/85   Pulse 71   Wt 178 lb (80.7 kg)   SpO2 100%   BMI 27.06 kg/m    Wt Readings from Last 3 Encounters:  04/07/20 178 lb (80.7 kg)  02/21/20 181 lb (82.1 kg)  06/27/19 174 lb (78.9 kg)         Lab Results  Component Value Date   TSH 1.44 10/18/2018   Lab Results  Component Value Date   WBC 4.7 06/27/2019   HGB 11.0 (L) 06/27/2019   HCT 34.2 (L) 06/27/2019   MCV 91 06/27/2019   PLT 286 06/27/2019   Lab Results  Component Value Date   NA 140 06/27/2019   K 3.8 06/27/2019   CO2 25 06/27/2019   GLUCOSE 82 06/27/2019   BUN 16 06/27/2019   CREATININE 1.35 (H)  06/27/2019   BILITOT 0.7 06/27/2019   ALKPHOS 81 06/27/2019   AST 19 06/27/2019   ALT 9 06/27/2019   PROT 6.8 06/27/2019   ALBUMIN 4.5 06/27/2019   CALCIUM 9.4 06/27/2019   ANIONGAP 9 05/15/2019   GFR 73.27 03/06/2019   Lab Results  Component Value Date   CHOL 81 (L) 05/23/2019   Lab Results  Component Value Date   HDL 44 05/23/2019   Lab Results  Component Value Date   LDLCALC 25 05/23/2019   Lab Results  Component Value Date   TRIG 46 05/23/2019   Lab Results  Component Value Date   CHOLHDL 1.8 05/23/2019   Lab Results  Component Value Date   HGBA1C 5.3 04/29/2019      Assessment & Plan:  1. Rash and nonspecific skin eruption Discussed with patient that this rash may represent a contact dermatitis. He is being prescribed a steroid cream to use to the affected skin areas and try to notice if he has had recent changes in soaps/detergents or personal care products around the time of the rash as well as seeing if rash occurs after any work-related exposures. Education material on rash given as part of after visit summary.  - triamcinolone cream (KENALOG) 0.1 %; Apply 1 application topically 2 (two) times daily. To affected skin areas x 10 days then as needed  Dispense: 30 g; Refill: 3    Follow-up: Return in about 3 weeks (around 04/28/2020) for chronic issues- please come to next visit fasting- you can drink water to take your meds .  Patient has  outstanding labs that have never been done in follow-up with his CAD, hyperlipidemia, hypertension and renal insufficiency and has been asked to schedule fasting follow-up appointment.  Antony Blackbird, MD

## 2020-04-07 NOTE — Patient Instructions (Addendum)
Rash, Adult  A rash is a change in the color of your skin. A rash can also change the way your skin feels. There are many different conditions and factors that can cause a rash. Follow these instructions at home: The goal of treatment is to stop the itching and keep the rash from spreading. Watch for any changes in your symptoms. Let your doctor know about them. Follow these instructions to help with your condition: Medicine Take or apply over-the-counter and prescription medicines only as told by your doctor. These may include medicines:  To treat red or swollen skin (corticosteroid creams).  To treat itching.  To treat an allergy (oral antihistamines).  To treat very bad symptoms (oral corticosteroids).  Skin care  Put cool cloths (compresses) on the affected areas.  Do not scratch or rub your skin.  Avoid covering the rash. Make sure that the rash is exposed to air as much as possible. Managing itching and discomfort  Avoid hot showers or baths. These can make itching worse. A cold shower may help.  Try taking a bath with: ? Epsom salts. You can get these at your local pharmacy or grocery store. Follow the instructions on the package. ? Baking soda. Pour a small amount into the bath as told by your doctor. ? Colloidal oatmeal. You can get this at your local pharmacy or grocery store. Follow the instructions on the package.  Try putting baking soda paste onto your skin. Stir water into baking soda until it gets like a paste.  Try putting on a lotion that relieves itchiness (calamine lotion).  Keep cool and out of the sun. Sweating and being hot can make itching worse. General instructions   Rest as needed.  Drink enough fluid to keep your pee (urine) pale yellow.  Wear loose-fitting clothing.  Avoid scented soaps, detergents, and perfumes. Use gentle soaps, detergents, perfumes, and other cosmetic products.  Avoid anything that causes your rash. Keep a journal to  help track what causes your rash. Write down: ? What you eat. ? What cosmetic products you use. ? What you drink. ? What you wear. This includes jewelry.  Keep all follow-up visits as told by your doctor. This is important. Contact a doctor if:  You sweat at night.  You lose weight.  You pee (urinate) more than normal.  You pee less than normal, or you notice that your pee is a darker color than normal.  You feel weak.  You throw up (vomit).  Your skin or the whites of your eyes look yellow (jaundice).  Your skin: ? Tingles. ? Is numb.  Your rash: ? Does not go away after a few days. ? Gets worse.  You are: ? More thirsty than normal. ? More tired than normal.  You have: ? New symptoms. ? Pain in your belly (abdomen). ? A fever. ? Watery poop (diarrhea). Get help right away if:  You have a fever and your symptoms suddenly get worse.  You start to feel mixed up (confused).  You have a very bad headache or a stiff neck.  You have very bad joint pains or stiffness.  You have jerky movements that you cannot control (seizure).  Your rash covers all or most of your body. The rash may or may not be painful.  You have blisters that: ? Are on top of the rash. ? Grow larger. ? Grow together. ? Are painful. ? Are inside your nose or mouth.  You have a rash   that: ? Looks like purple pinprick-sized spots all over your body. ? Has a "bull's eye" or looks like a target. ? Is red and painful, causes your skin to peel, and is not from being in the sun too long. Summary  A rash is a change in the color of your skin. A rash can also change the way your skin feels.  The goal of treatment is to stop the itching and keep the rash from spreading.  Take or apply over-the-counter and prescription medicines only as told by your doctor.  Contact a doctor if you have new symptoms or symptoms that get worse.  Keep all follow-up visits as told by your doctor. This is  important. This information is not intended to replace advice given to you by your health care provider. Make sure you discuss any questions you have with your health care provider. Document Revised: 09/08/2018 Document Reviewed: 12/19/2017 Elsevier Patient Education  2020 Elsevier Inc.  

## 2020-04-07 NOTE — Progress Notes (Signed)
LOOKS TO BE ECZEMA PT STATES FLARES AND ITCHY AT TIMES ON BOTH ARMS

## 2020-04-15 MED FILL — RANOLAZINE ER 500 MG TABLET: 500 | 30 days supply | Qty: 60 | Fill #5

## 2020-04-23 MED FILL — CLOPIDOGREL 75 MG TABLET: 75 | 30 days supply | Qty: 30 | Fill #6

## 2020-04-23 MED FILL — ROSUVASTATIN CALCIUM 40 MG: 40 | 30 days supply | Qty: 30 | Fill #5

## 2020-05-07 ENCOUNTER — Ambulatory Visit: Payer: Self-pay | Attending: Physician Assistant | Admitting: Physician Assistant

## 2020-05-07 ENCOUNTER — Ambulatory Visit: Payer: Self-pay | Admitting: Family Medicine

## 2020-05-07 ENCOUNTER — Encounter: Payer: Self-pay | Admitting: Physician Assistant

## 2020-05-07 ENCOUNTER — Other Ambulatory Visit: Payer: Self-pay

## 2020-05-07 VITALS — BP 135/84 | HR 60 | Temp 98.2°F | Resp 18 | Ht 68.0 in | Wt 169.0 lb

## 2020-05-07 DIAGNOSIS — R972 Elevated prostate specific antigen [PSA]: Secondary | ICD-10-CM

## 2020-05-07 DIAGNOSIS — I10 Essential (primary) hypertension: Secondary | ICD-10-CM

## 2020-05-07 DIAGNOSIS — E785 Hyperlipidemia, unspecified: Secondary | ICD-10-CM

## 2020-05-07 DIAGNOSIS — Z79899 Other long term (current) drug therapy: Secondary | ICD-10-CM

## 2020-05-07 NOTE — Patient Instructions (Signed)
Please let us know if you do require any medication refills, we will call you with your lab results.  Please let us know if there is anything else we can do for you  Roney Jaffe, PA-C Physician Assistant [facility] [email or web address]   DASH Eating Plan DASH stands for "Dietary Approaches to Stop Hypertension." The DASH eating plan is a healthy eating plan that has been shown to reduce high blood pressure (hypertension). It may also reduce your risk for type 2 diabetes, heart disease, and stroke. The DASH eating plan may also help with weight loss. What are tips for following this plan?  General guidelines  Avoid eating more than 2,300 mg (milligrams) of salt (sodium) a day. If you have hypertension, you may need to reduce your sodium intake to 1,500 mg a day.  Limit alcohol intake to no more than 1 drink a day for nonpregnant women and 2 drinks a day for men. One drink equals 12 oz of beer, 5 oz of wine, or 1 oz of hard liquor.  Work with your health care provider to maintain a healthy body weight or to lose weight. Ask what an ideal weight is for you.  Get at least 30 minutes of exercise that causes your heart to beat faster (aerobic exercise) most days of the week. Activities may include walking, swimming, or biking.  Work with your health care provider or diet and nutrition specialist (dietitian) to adjust your eating plan to your individual calorie needs. Reading food labels   Check food labels for the amount of sodium per serving. Choose foods with less than 5 percent of the Daily Value of sodium. Generally, foods with less than 300 mg of sodium per serving fit into this eating plan.  To find whole grains, look for the word "whole" as the first word in the ingredient list. Shopping  Buy products labeled as "low-sodium" or "no salt added."  Buy fresh foods. Avoid canned foods and premade or frozen  meals. Cooking  Avoid adding salt when cooking. Use salt-free seasonings or herbs instead of table salt or sea salt. Check with your health care provider or pharmacist before using salt substitutes.  Do not fry foods. Cook foods using healthy methods such as baking, boiling, grilling, and broiling instead.  Cook with heart-healthy oils, such as olive, canola, soybean, or sunflower oil. Meal planning  Eat a balanced diet that includes: ? 5 or more servings of fruits and vegetables each day. At each meal, try to fill half of your plate with fruits and vegetables. ? Up to 6-8 servings of whole grains each day. ? Less than 6 oz of lean meat, poultry, or fish each day. A 3-oz serving of meat is about the same size as a deck of cards. One egg equals 1 oz. ? 2 servings of low-fat dairy each day. ? A serving of nuts, seeds, or beans 5 times each week. ? Heart-healthy fats. Healthy fats called Omega-3 fatty acids are found in foods such as flaxseeds and coldwater fish, like sardines, salmon, and mackerel.  Limit how much you eat of the following: ? Canned or prepackaged foods. ? Food that is high in trans fat, such as fried foods. ? Food that is high in saturated fat, such as fatty meat. ? Sweets, desserts, sugary drinks, and other foods with added sugar. ? Full-fat dairy products.  Do not salt foods before eating.  Try to eat at least 2 vegetarian meals each week.  Eat  more home-cooked food and less restaurant, buffet, and fast food.  When eating at a restaurant, ask that your food be prepared with less salt or no salt, if possible. What foods are recommended? The items listed may not be a complete list. Talk with your dietitian about what dietary choices are best for you. Grains Whole-grain or whole-wheat bread. Whole-grain or whole-wheat pasta. Brown rice. Orpah Cobb. Bulgur. Whole-grain and low-sodium cereals. Pita bread. Low-fat, low-sodium crackers. Whole-wheat flour  tortillas. Vegetables Fresh or frozen vegetables (raw, steamed, roasted, or grilled). Low-sodium or reduced-sodium tomato and vegetable juice. Low-sodium or reduced-sodium tomato sauce and tomato paste. Low-sodium or reduced-sodium canned vegetables. Fruits All fresh, dried, or frozen fruit. Canned fruit in natural juice (without added sugar). Meat and other protein foods Skinless chicken or [place]. Ground chicken or [place]. Pork with fat trimmed off. Fish and seafood. Egg whites. Dried beans, peas, or lentils. Unsalted nuts, nut butters, and seeds. Unsalted canned beans. Lean cuts of beef with fat trimmed off. Low-sodium, lean deli meat. Dairy Low-fat (1%) or fat-free (skim) milk. Fat-free, low-fat, or reduced-fat cheeses. Nonfat, low-sodium ricotta or cottage cheese. Low-fat or nonfat yogurt. Low-fat, low-sodium cheese. Fats and oils Soft margarine without trans fats. Vegetable oil. Low-fat, reduced-fat, or light mayonnaise and salad dressings (reduced-sodium). Canola, safflower, olive, soybean, and sunflower oils. Avocado. Seasoning and other foods Herbs. Spices. Seasoning mixes without salt. Unsalted popcorn and pretzels. Fat-free sweets. What foods are not recommended? The items listed may not be a complete list. Talk with your dietitian about what dietary choices are best for you. Grains Baked goods made with fat, such as croissants, muffins, or some breads. Dry pasta or rice meal packs. Vegetables Creamed or fried vegetables. Vegetables in a cheese sauce. Regular canned vegetables (not low-sodium or reduced-sodium). Regular canned tomato sauce and paste (not low-sodium or reduced-sodium). Regular tomato and vegetable juice (not low-sodium or reduced-sodium). Rosita Fire. Olives. Fruits Canned fruit in a light or heavy syrup. Fried fruit. Fruit in cream or butter sauce. Meat and other protein foods Fatty cuts of meat. Ribs. Fried meat. Tomasa Blase. Sausage. Bologna and other processed lunch meats.  Salami. Fatback. Hotdogs. Bratwurst. Salted nuts and seeds. Canned beans with added salt. Canned or smoked fish. Whole eggs or egg yolks. Chicken or [place] with skin. Dairy Whole or 2% milk, cream, and half-and-half. Whole or full-fat cream cheese. Whole-fat or sweetened yogurt. Full-fat cheese. Nondairy creamers. Whipped toppings. Processed cheese and cheese spreads. Fats and oils Butter. Stick margarine. Lard. Shortening. Ghee. Bacon fat. Tropical oils, such as coconut, palm kernel, or palm oil. Seasoning and other foods Salted popcorn and pretzels. Onion salt, garlic salt, seasoned salt, table salt, and sea salt. Worcestershire sauce. Tartar sauce. Barbecue sauce. Teriyaki sauce. Soy sauce, including reduced-sodium. Steak sauce. Canned and packaged gravies. Fish sauce. Oyster sauce. Cocktail sauce. Horseradish that you find on the shelf. Ketchup. Mustard. Meat flavorings and tenderizers. Bouillon cubes. Hot sauce and Tabasco sauce. Premade or packaged marinades. Premade or packaged taco seasonings. Relishes. Regular salad dressings. Where to find more information:  National Heart, Lung, and Blood Institute: PopSteam.is  American Heart Association: www.heart.org Summary  The DASH eating plan is a healthy eating plan that has been shown to reduce high blood pressure (hypertension). It may also reduce your risk for type 2 diabetes, heart disease, and stroke.  With the DASH eating plan, you should limit salt (sodium) intake to 2,300 mg a day. If you have hypertension, you may need to reduce your sodium intake to  1,500 mg a day.  When on the DASH eating plan, aim to eat more fresh fruits and vegetables, whole grains, lean proteins, low-fat dairy, and heart-healthy fats.  Work with your health care provider or diet and nutrition specialist (dietitian) to adjust your eating plan to your individual calorie needs. This information is not intended to replace advice given to you by your health  care provider. Make sure you discuss any questions you have with your health care provider. Document Revised: 04/29/2017 Document Reviewed: 05/10/2016 Elsevier Patient Education  2020 [employer].

## 2020-05-07 NOTE — Progress Notes (Signed)
Patient has not eaten and patient takes medications at night. Patient denies pain at this time.

## 2020-05-07 NOTE — Progress Notes (Signed)
Established Patient Office Visit  Subjective:  Patient ID: [name], male    DOB: Jun 28, 1960  Age: 59 y.o. MRN: [id number]  CC:  Chief Complaint  Patient presents with  . Follow-up    HPI [name] reports that he is compliant to his medications at home, states that blood pressure at home runs within normal limits, has been increasing his exercise, and following a lower sodium diet.  Does have upcoming appointment to get his COVID-19 booster shot.  Reports he was seen at the beginning of last month for a rash, states that it resolved with the cream that was prescribed.  No other concerns at this time  Past Medical History:  Diagnosis Date  . Apical variant hypertrophic cardiomyopathy (HCC)   . CAD (coronary artery disease)    a. LHC in 2011 with severe dz in D1/D2 and very distal LAD--> too small for PCI and managed medically. b.  LHC in 10/2013 after an abnormal nuclear study w/ non-obst dz/c. 01/2016 NSTEMI 2.5 x 38 mm Promus DES to Ramus    . Chronic back pain   . 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  Marland Kitchen HOH (hard of hearing)    rgiht ear  . Hyperlipidemia   . Hypertension   . Migraine headache   . Stroke [facility])     Past Surgical History:  Procedure Laterality Date  . CARDIAC CATHETERIZATION N/A 02/24/2016   Procedure: Left Heart Cath and Coronary Angiography;  Surgeon: Tonny Bollman, MD;  Location: [facility] INVASIVE CV LAB;  Service: Cardiovascular;  Laterality: N/A;  . CARDIAC CATHETERIZATION N/A 02/24/2016   Procedure: Coronary Stent Intervention;  Surgeon: Tonny Bollman, MD;  Location: [facility] INVASIVE CV LAB;  Service: Cardiovascular;  Laterality: N/A;  . CORONARY ANGIOPLASTY WITH STENT PLACEMENT  2017  .  CORONARY BALLOON ANGIOPLASTY N/A 04/30/2019   Procedure: CORONARY BALLOON ANGIOPLASTY;  Surgeon: Swaziland, Peter M, MD;  Location: [facility] INVASIVE CV LAB;  Service: Cardiovascular;  Laterality: N/A;  . INGUINAL HERNIA REPAIR    . LEFT HEART CATH AND CORONARY ANGIOGRAPHY N/A 04/30/2019   Procedure: LEFT HEART CATH AND CORONARY ANGIOGRAPHY;  Surgeon: Dolores Patty, MD;  Location: [facility] INVASIVE CV LAB;  Service: Cardiovascular;  Laterality: N/A;  . LEFT HEART CATH AND CORONARY ANGIOGRAPHY N/A 05/14/2019   Procedure: LEFT HEART CATH AND CORONARY ANGIOGRAPHY;  Surgeon: Swaziland, Peter M, MD;  Location: [facility] INVASIVE CV LAB;  Service: Cardiovascular;  Laterality: N/A;  . LEFT HEART CATHETERIZATION WITH CORONARY ANGIOGRAM N/A 11/22/2013   Procedure: LEFT HEART CATHETERIZATION WITH CORONARY ANGIOGRAM;  Surgeon: [name], MD;  Location: [facility] CATH LAB;  Service: Cardiovascular;  Laterality: N/A;  . TEE WITHOUT CARDIOVERSION N/A 08/22/2015   Procedure: TRANSESOPHAGEAL ECHOCARDIOGRAM (TEE);  Surgeon: Vesta Mixer, MD;  Location: [facility] ENDOSCOPY;  Service: Cardiovascular;  Laterality: N/A;    Family History  Problem Relation Age of Onset  . Hypertension Mother   . Heart failure Mother   . Hypertension Father   . Colon cancer Father 58  . Colon cancer Paternal Uncle     Social History   Socioeconomic History  . Marital status: Married    Spouse name: Not on file  . Number of children: Not  on file  . Years of education: Not on file  . Highest education level: Not on file  Occupational History  . Not on file  Tobacco Use  . Smoking status: Current Some Day Smoker    Packs/day: 0.25    Types: Cigarettes  . Smokeless tobacco: Never Used  Vaping Use  . Vaping Use: Never used  Substance and Sexual Activity  . Alcohol use: No    Alcohol/week: 0.0 standard drinks  . Drug use: No  . Sexual activity: Not Currently  Other Topics Concern  . Not on file  Social History Narrative  . Not on file    Social Determinants of Health   Financial Resource Strain:   . Difficulty of Paying Living Expenses: Not on file  Food Insecurity:   . Worried About Programme researcher, broadcasting/film/video in the Last Year: Not on file  . Ran Out of Food in the Last Year: Not on file  Transportation Needs:   . Lack of Transportation (Medical): Not on file  . Lack of Transportation (Non-Medical): Not on file  Physical Activity:   . Days of Exercise per Week: Not on file  . Minutes of Exercise per Session: Not on file  Stress:   . Feeling of Stress : Not on file  Social Connections:   . Frequency of Communication with Friends and Family: Not on file  . Frequency of Social Gatherings with Friends and Family: Not on file  . Attends Religious Services: Not on file  . Active Member of Clubs or Organizations: Not on file  . Attends Banker Meetings: Not on file  . Marital Status: Not on file  Intimate Partner Violence:   . Fear of Current or Ex-Partner: Not on file  . Emotionally Abused: Not on file  . Physically Abused: Not on file  . Sexually Abused: Not on file    Outpatient Medications Prior to Visit  Medication Sig Dispense Refill  . aspirin 81 MG chewable tablet Chew 1 tablet (81 mg total) by mouth daily.    . clopidogrel (PLAVIX) 75 MG tablet Take 1 tablet (75 mg total) by mouth daily. 90 tablet 3  . losartan-hydrochlorothiazide (HYZAAR) 100-25 MG tablet Take 1 tablet by mouth daily. 90 tablet 3  . nitroGLYCERIN (NITROSTAT) 0.4 MG SL tablet Place 1 tablet (0.4 mg total) under the tongue every 5 (five) minutes as needed for chest pain. 25 tablet 3  . ranolazine (RANEXA) 500 MG 12 hr tablet Take 1 tablet (500 mg total) by mouth 2 (two) times daily. 180 tablet 3  . rosuvastatin (CRESTOR) 40 MG tablet Take 1 tablet (40 mg total) by mouth daily. 90 tablet 0  . thiamine (CVS B-1) 100 MG tablet Take 1 tablet (100 mg total) by mouth every other day. 45 tablet 1  . triamcinolone cream (KENALOG) 0.1 % Apply  1 application topically 2 (two) times daily. To affected skin areas x 10 days then as needed 30 g 3   No facility-administered medications prior to visit.    Allergies  Allergen Reactions  . Lisinopril Swelling    Facial swelling    ROS Review of Systems  Constitutional: Negative.   HENT: Negative.   Eyes: Negative.   Respiratory: Negative for shortness of breath.   Cardiovascular: Negative for chest pain and palpitations.  Gastrointestinal: Negative.   Endocrine: Negative.   Genitourinary: Negative.   Musculoskeletal: Negative.   Skin: Negative.   Allergic/Immunologic: Negative.   Neurological: Negative.   Hematological:  Negative.   Psychiatric/Behavioral: Negative.       Objective:    Physical Exam Vitals and nursing note reviewed.   BP 135/84 (BP Location: Left Arm, Patient Position: Sitting, Cuff Size: Normal)   Pulse 60   Temp 98.2 F (36.8 C) (Oral)   Resp 18   Ht 5\' 8"  (1.727 m)   Wt 169 lb (76.7 kg)   SpO2 100%   BMI 25.70 kg/m   General Appearance:    Alert, cooperative, no distress, appears stated age  Head:    Normocephalic, without obvious abnormality, atraumatic  Eyes:    PERRL, conjunctiva/corneas clear, EOM's intact, fundi    benign, both eyes       Ears:    Normal TM's and external ear canals, both ears  Nose:   Nares normal, septum midline, mucosa normal, no drainage   or sinus tenderness  Throat:   Lips, mucosa, and tongue normal; teeth and gums normal  Neck:   Supple, symmetrical, trachea midline, no adenopathy;       thyroid:  No enlargement/tenderness/nodules; no carotid   bruit or JVD  Back:     Symmetric, no curvature, ROM normal, no CVA tenderness  Lungs:     Clear to auscultation bilaterally, respirations unlabored  Chest wall:    No tenderness or deformity  Heart:    Regular rate and rhythm, S1 and S2 normal, no murmur, rub   or gallop  Abdomen:     Soft, non-tender, bowel sounds active all four quadrants,    no masses, no  organomegaly        Extremities:   Extremities normal, atraumatic, no cyanosis or edema  Pulses:   2+ and symmetric all extremities  Skin:   Skin color, texture, turgor normal, no rashes or lesions  Lymph nodes:   Cervical, supraclavicular, and axillary nodes normal  Neurologic:   CNII-XII intact. Normal strength, sensation and reflexes      throughout    BP 135/84 (BP Location: Left Arm, Patient Position: Sitting, Cuff Size: Normal)   Pulse 60   Temp 98.2 F (36.8 C) (Oral)   Resp 18   Ht 5\' 8"  (1.727 m)   Wt 169 lb (76.7 kg)   SpO2 100%   BMI 25.70 kg/m  Wt Readings from Last 3 Encounters:  05/07/20 169 lb (76.7 kg)  04/07/20 178 lb (80.7 kg)  02/21/20 181 lb (82.1 kg)     There are no preventive care reminders to display for this patient.  There are no preventive care reminders to display for this patient.  Lab Results  Component Value Date   TSH 1.44 10/18/2018   Lab Results  Component Value Date   WBC 4.7 06/27/2019   HGB 11.0 (L) 06/27/2019   HCT 34.2 (L) 06/27/2019   MCV 91 06/27/2019   PLT 286 06/27/2019   Lab Results  Component Value Date   NA 140 06/27/2019   K 3.8 06/27/2019   CO2 25 06/27/2019   GLUCOSE 82 06/27/2019   BUN 16 06/27/2019   CREATININE 1.35 (H) 06/27/2019   BILITOT 0.7 06/27/2019   ALKPHOS 81 06/27/2019   AST 19 06/27/2019   ALT 9 06/27/2019   PROT 6.8 06/27/2019   ALBUMIN 4.5 06/27/2019   CALCIUM 9.4 06/27/2019   ANIONGAP 9 05/15/2019   GFR 73.27 03/06/2019   Lab Results  Component Value Date   CHOL 81 (L) 05/23/2019   Lab Results  Component Value Date   HDL 44 05/23/2019  Lab Results  Component Value Date   LDLCALC 25 05/23/2019   Lab Results  Component Value Date   TRIG 46 05/23/2019   Lab Results  Component Value Date   CHOLHDL 1.8 05/23/2019   Lab Results  Component Value Date   HGBA1C 5.3 04/29/2019      Assessment & Plan:   Problem List Items Addressed This Visit      Cardiovascular and  Mediastinum   Essential hypertension - Primary (Chronic)   Relevant Orders   CBC with Differential/Platelet   Comp. Metabolic Panel (12)     Other   Rising PSA level   Relevant Orders   PSA    Other Visit Diagnoses    Long-term use of high-risk medication       Hyperlipidemia, unspecified hyperlipidemia type       Relevant Orders   Lipid panel    1. Essential hypertension Continue current regimen,  continue cardiology follow-up, patient stated no refills needed today - CBC with Differential/Platelet - Comp. Metabolic Panel (12)  2. Long-term use of high-risk medication   3. Hyperlipidemia, unspecified hyperlipidemia type Fasting labs completed today - Lipid panel  4. Rising PSA level  - PSA   I have reviewed the patient's medical history (PMH, PSH, Social History, Family History, Medications, and allergies) , and have been updated if relevant. I spent 30 minutes reviewing chart and  face to face time with patient.    No orders of the defined types were placed in this encounter.   Follow-up: Return in about 6 months (around 11/05/2020) for [facility], To establish PCP.    Kasandra Knudsen Chetara Kropp, PA-C

## 2020-05-08 ENCOUNTER — Telehealth: Payer: Self-pay | Admitting: *Deleted

## 2020-05-08 LAB — CBC WITH DIFFERENTIAL/PLATELET
Basophils Absolute: 0 10*3/uL (ref 0.0–0.2)
Basos: 1 %
EOS (ABSOLUTE): 0.2 10*3/uL (ref 0.0–0.4)
Eos: 4 %
Hematocrit: 38.7 % (ref 37.5–51.0)
Hemoglobin: 13 g/dL (ref 13.0–17.7)
Immature Grans (Abs): 0 10*3/uL (ref 0.0–0.1)
Immature Granulocytes: 0 %
Lymphocytes Absolute: 1.8 10*3/uL (ref 0.7–3.1)
Lymphs: 34 %
MCH: 29.7 pg (ref 26.6–33.0)
MCHC: 33.6 g/dL (ref 31.5–35.7)
MCV: 89 fL (ref 79–97)
Monocytes Absolute: 0.4 10*3/uL (ref 0.1–0.9)
Monocytes: 8 %
Neutrophils Absolute: 2.8 10*3/uL (ref 1.4–7.0)
Neutrophils: 53 %
Platelets: 300 10*3/uL (ref 150–450)
RBC: 4.37 x10E6/uL (ref 4.14–5.80)
RDW: 10.8 % — ABNORMAL LOW (ref 11.6–15.4)
WBC: 5.2 10*3/uL (ref 3.4–10.8)

## 2020-05-08 LAB — COMP. METABOLIC PANEL (12)
AST: 20 IU/L (ref 0–40)
Albumin/Globulin Ratio: 1.7 (ref 1.2–2.2)
Albumin: 4.8 g/dL (ref 3.8–4.9)
Alkaline Phosphatase: 85 IU/L (ref 44–121)
BUN/Creatinine Ratio: 11 (ref 9–20)
BUN: 15 mg/dL (ref 6–24)
Bilirubin Total: 0.6 mg/dL (ref 0.0–1.2)
Calcium: 9.7 mg/dL (ref 8.7–10.2)
Chloride: 100 mmol/L (ref 96–106)
Creatinine, Ser: 1.31 mg/dL — ABNORMAL HIGH (ref 0.76–1.27)
GFR calc Af Amer: 69 mL/min/{1.73_m2} (ref >59)
GFR calc non Af Amer: 60 mL/min/{1.73_m2} (ref >59)
Globulin, Total: 2.9 g/dL (ref 1.5–4.5)
Glucose: 85 mg/dL (ref 65–99)
Potassium: 3.9 mmol/L (ref 3.5–5.2)
Sodium: 141 mmol/L (ref 134–144)
Total Protein: 7.7 g/dL (ref 6.0–8.5)

## 2020-05-08 LAB — LIPID PANEL
Chol/HDL Ratio: 1.8 ratio (ref 0.0–5.0)
Cholesterol, Total: 85 mg/dL — ABNORMAL LOW (ref 100–199)
HDL: 47 mg/dL (ref >39)
LDL Chol Calc (NIH): 26 mg/dL (ref 0–99)
Triglycerides: 43 mg/dL (ref 0–149)
VLDL Cholesterol Cal: 12 mg/dL (ref 5–40)

## 2020-05-08 LAB — PSA: Prostate Specific Ag, Serum: 3.8 ng/mL (ref 0.0–4.0)

## 2020-05-08 NOTE — Telephone Encounter (Signed)
Patient verified DOB Patient is aware of labs being normal and no further concerns at this time.

## 2020-05-08 NOTE — Telephone Encounter (Signed)
-----   Message from Roney Jaffe, [place] sent at 05/08/2020  1:16 PM EST ----- Please call patient and let him know that his kidney and liver function are within normal limits, he does not show signs of anemia, screening for prostate cancer is negative, cholesterol levels remain stable

## 2020-05-09 MED FILL — LOSARTAN-HCTZ 100-25 MG TAB: 100-25 | 30 days supply | Qty: 30 | Fill #9

## 2020-05-28 MED FILL — CLOPIDOGREL 75 MG TABLET: 75 | 30 days supply | Qty: 30 | Fill #7

## 2020-05-28 MED FILL — ROSUVASTATIN CALCIUM 40 MG: 40 | 30 days supply | Qty: 30 | Fill #0

## 2020-06-05 MED FILL — ROSUVASTATIN CALCIUM 40 MG: 40 | 30 days supply | Qty: 30 | Fill #0

## 2020-06-05 MED FILL — CLOPIDOGREL 75 MG TABLET: 75 | 30 days supply | Qty: 30 | Fill #7

## 2020-06-13 MED FILL — LOSARTAN-HCTZ 100-25 MG TAB: 100-25 | 30 days supply | Qty: 30 | Fill #0

## 2020-06-23 MED FILL — RANOLAZINE ER 500 MG TABLET: 500 | 30 days supply | Qty: 60 | Fill #0

## 2020-07-07 ENCOUNTER — Other Ambulatory Visit: Payer: Self-pay | Admitting: Family Medicine

## 2020-07-07 DIAGNOSIS — I25119 Atherosclerotic heart disease of native coronary artery with unspecified angina pectoris: Secondary | ICD-10-CM

## 2020-07-07 MED FILL — ROSUVASTATIN CALCIUM 40 MG: 40 | 30 days supply | Qty: 30 | Fill #1

## 2020-07-07 NOTE — Telephone Encounter (Signed)
Requested medication (s) are due for refill today: yes  Requested medication (s) are on the active medication list: yes  Last refill:  06/05/20  Future visit scheduled: no  Notes to clinic:  previously seen by Dr. Hewitt Shorts fulp    Requested Prescriptions  Pending Prescriptions Disp Refills   clopidogrel (PLAVIX) 75 MG tablet [Pharmacy Med Name: CLOPIDOGREL 75 MG TABLET 75 Tablet] 30 tablet 3    Sig: Take 1 tablet (75 mg total) by mouth daily.      Hematology: Antiplatelets - clopidogrel Failed - 07/07/2020  9:42 AM      Failed - Evaluate AST, ALT within 2 months of therapy initiation.      Failed - ALT in normal range and within 360 days    ALT  Date Value Ref Range Status  06/27/2019 9 0 - 44 IU/L Final          Passed - AST in normal range and within 360 days    AST  Date Value Ref Range Status  05/07/2020 20 0 - 40 IU/L Final          Passed - HCT in normal range and within 180 days    Hematocrit  Date Value Ref Range Status  05/07/2020 38.7 37.5 - 51.0 % Final          Passed - HGB in normal range and within 180 days    Hemoglobin  Date Value Ref Range Status  05/07/2020 13.0 13.0 - 17.7 g/dL Final          Passed - PLT in normal range and within 180 days    Platelets  Date Value Ref Range Status  05/07/2020 300 150 - 450 x10E3/uL Final          Passed - Valid encounter within last 6 months    Recent Outpatient Visits           2 months ago Essential hypertension   [place] [address] [facility] [place], Cari S, PA-C   3 months ago Rash and nonspecific skin eruption   [place] [facility] [place], [place], MD   7 months ago Essential hypertension   [place] [facility] [place], [place], NP   1 year ago Essential hypertension    [facility] [place], [place], MD   6 years ago URI (upper respiratory infection)   [facility] at [facility], Myrle Sheng, MD       Future  Appointments             In 1 month Katrinka Blazing Barry Dienes, MD [facility], [facility]

## 2020-07-08 ENCOUNTER — Other Ambulatory Visit: Payer: Self-pay | Admitting: Family Medicine

## 2020-07-08 MED FILL — CLOPIDOGREL 75 MG TABLET: 75 | 30 days supply | Qty: 30 | Fill #0

## 2020-07-16 MED FILL — CLOPIDOGREL 75 MG TABLET: 75 | 30 days supply | Qty: 30 | Fill #0

## 2020-07-18 ENCOUNTER — Other Ambulatory Visit: Payer: Self-pay | Admitting: Family Medicine

## 2020-07-18 ENCOUNTER — Other Ambulatory Visit: Payer: Self-pay | Admitting: Physician Assistant

## 2020-07-18 DIAGNOSIS — I10 Essential (primary) hypertension: Secondary | ICD-10-CM

## 2020-07-18 MED FILL — LOSARTAN-HCTZ 100-25 MG TAB: 100-25 | 30 days supply | Qty: 30 | Fill #0

## 2020-08-09 NOTE — Progress Notes (Unsigned)
Cardiology Office Note:    Date:  08/14/2020   ID:  [name], DOB [birth year], MRN [id number]  PCP:  Patient, No Pcp Per  Cardiologist:  [name], MD   Referring MD: Cain Saupe, MD   Chief Complaint  Patient presents with  . Coronary Artery Disease    History of Present Illness:    [name] is a 60 y.o. male with a hx of CAD(recent cutting balloon angioplasty of the ramus intermedius on 04/30/2019),apical variant hypertrophic cardiomyopathy, GERD, chronic renal insufficiency, chronic back pain, hyperlipidemia(on rosuvastatin and Repatha), hypertension, tobacco abuse,prior CVA and migraine headaches  Has recurring angina with possible component of microvascular disease suspected.  He has no complaints.  He denies angina change.  He has used nitroglycerin infrequently.  He has been very busy at work.  It seems that he has lost weight.,  However it is stable compared to prior.  Past Medical History:  Diagnosis Date  . Apical variant hypertrophic cardiomyopathy (HCC)   . CAD (coronary artery disease)    a. LHC in 2011 with severe dz in D1/D2 and very distal LAD--> too small for PCI and managed medically. b.  LHC in 10/2013 after an abnormal nuclear study w/ non-obst dz/c. 01/2016 NSTEMI 2.5 x 38 mm Promus DES to Ramus    . Chronic back pain   . 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  Marland Kitchen HOH (hard of hearing)    rgiht ear  . Hyperlipidemia   . Hypertension   . Migraine headache   . Stroke [facility])     Past Surgical History:  Procedure Laterality Date  . CARDIAC CATHETERIZATION N/A 02/24/2016   Procedure: Left Heart Cath and Coronary Angiography;  Surgeon: Tonny Bollman, MD;  Location: [facility] INVASIVE CV  LAB;  Service: Cardiovascular;  Laterality: N/A;  . CARDIAC CATHETERIZATION N/A 02/24/2016   Procedure: Coronary Stent Intervention;  Surgeon: Tonny Bollman, MD;  Location: [facility] INVASIVE CV LAB;  Service: Cardiovascular;  Laterality: N/A;  . CORONARY ANGIOPLASTY WITH STENT PLACEMENT  2017  . CORONARY BALLOON ANGIOPLASTY N/A 04/30/2019   Procedure: CORONARY BALLOON ANGIOPLASTY;  Surgeon: Swaziland, Peter M, MD;  Location: [facility] INVASIVE CV LAB;  Service: Cardiovascular;  Laterality: N/A;  . INGUINAL HERNIA REPAIR    . LEFT HEART CATH AND CORONARY ANGIOGRAPHY N/A 04/30/2019   Procedure: LEFT HEART CATH AND CORONARY ANGIOGRAPHY;  Surgeon: Dolores Patty, MD;  Location: [facility] INVASIVE CV LAB;  Service: Cardiovascular;  Laterality: N/A;  . LEFT HEART CATH AND CORONARY ANGIOGRAPHY N/A 05/14/2019   Procedure: LEFT HEART CATH AND CORONARY ANGIOGRAPHY;  Surgeon: Swaziland, Peter M, MD;  Location: [facility] INVASIVE CV LAB;  Service: Cardiovascular;  Laterality: N/A;  . LEFT HEART CATHETERIZATION WITH CORONARY ANGIOGRAM N/A 11/22/2013   Procedure: LEFT HEART CATHETERIZATION WITH CORONARY ANGIOGRAM;  Surgeon: [name], MD;  Location: [facility] CATH LAB;  Service: Cardiovascular;  Laterality: N/A;  . TEE WITHOUT CARDIOVERSION N/A 08/22/2015   Procedure: TRANSESOPHAGEAL ECHOCARDIOGRAM (TEE);  Surgeon: Vesta Mixer, MD;  Location: [facility] ENDOSCOPY;  Service: Cardiovascular;  Laterality: N/A;    Current Medications: Current Meds  Medication Sig  . aspirin 81 MG chewable tablet Chew 1 tablet (81 mg total) by mouth daily.  Marland Kitchen  clopidogrel (PLAVIX) 75 MG tablet TAKE 1 TABLET (75 MG TOTAL) BY MOUTH DAILY.  Marland Kitchen losartan-hydrochlorothiazide (HYZAAR) 100-25 MG tablet TAKE 1 TABLET BY MOUTH DAILY.  . nitroGLYCERIN (NITROSTAT) 0.4 MG SL tablet Place 1 tablet (0.4 mg total) under the tongue every 5 (five) minutes as needed for chest pain.  . ranolazine (RANEXA) 500 MG 12 hr tablet Take 1 tablet (500 mg total) by mouth 2 (two) times  daily.  . rosuvastatin (CRESTOR) 40 MG tablet Take 1 tablet (40 mg total) by mouth daily.  Marland Kitchen thiamine (CVS B-1) 100 MG tablet Take 1 tablet (100 mg total) by mouth every other day.  . triamcinolone cream (KENALOG) 0.1 % Apply 1 application topically 2 (two) times daily. To affected skin areas x 10 days then as needed     Allergies:   Lisinopril   Social History   Socioeconomic History  . Marital status: Married    Spouse name: Not on file  . Number of children: Not on file  . Years of education: Not on file  . Highest education level: Not on file  Occupational History  . Not on file  Tobacco Use  . Smoking status: Current Some Day Smoker    Packs/day: 0.25    Types: Cigarettes  . Smokeless tobacco: Never Used  Vaping Use  . Vaping Use: Never used  Substance and Sexual Activity  . Alcohol use: No    Alcohol/week: 0.0 standard drinks  . Drug use: No  . Sexual activity: Not Currently  Other Topics Concern  . Not on file  Social History Narrative  . Not on file   Social Determinants of Health   Financial Resource Strain: Not on file  Food Insecurity: Not on file  Transportation Needs: Not on file  Physical Activity: Not on file  Stress: Not on file  Social Connections: Not on file     Family History: The patient's family history includes Colon cancer in his paternal uncle; Colon cancer (age of onset: 33) in his father; Heart failure in his mother; Hypertension in his father and mother.  ROS:   Please see the history of present illness.    No medication side effects.  All other systems reviewed and are negative.  EKGs/Labs/Other Studies Reviewed:    The following studies were reviewed today: No new data  EKG:  EKG not repeated  Recent Labs: 05/07/2020: BUN 15; Creatinine, Ser 1.31; Hemoglobin 13.0; Platelets 300; Potassium 3.9; Sodium 141  Recent Lipid Panel    Component Value Date/Time   CHOL 85 (L) 05/07/2020 0904   TRIG 43 05/07/2020 0904   HDL 47  05/07/2020 0904   CHOLHDL 1.8 05/07/2020 0904   CHOLHDL 1.8 05/12/2019 0317   VLDL 7 05/12/2019 0317   LDLCALC 26 05/07/2020 0904    Physical Exam:    VS:  BP 126/88   Pulse (!) 54   Ht 5\' 8"  (1.727 m)   Wt 176 lb 9.6 oz (80.1 kg)   SpO2 98%   BMI 26.85 kg/m     Wt Readings from Last 3 Encounters:  08/14/20 176 lb 9.6 oz (80.1 kg)  05/07/20 169 lb (76.7 kg)  04/07/20 178 lb (80.7 kg)     GEN: Healthy-appearing. No acute distress HEENT: Normal NECK: No JVD. LYMPHATICS: No lymphadenopathy CARDIAC: No murmur. RRR S4 gallop, or edema. VASCULAR:  Normal Pulses. No bruits. RESPIRATORY:  Clear to auscultation without rales, wheezing or rhonchi  ABDOMEN: Soft, non-tender, non-distended, No pulsatile mass, MUSCULOSKELETAL: No  deformity  SKIN: Warm and dry NEUROLOGIC:  Alert and oriented x 3 PSYCHIATRIC:  Normal affect   ASSESSMENT:    1. 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 Hyperlipidemia, unspecified hyperlipidemia type   3. Apical variant hypertrophic cardiomyopathy (HCC)   4. Tobacco abuse   5. Essential hypertension   6. Educated about COVID-19 virus infection    PLAN:    In order of problems listed above:  1. Secondary prevention reviewed. 2. Target LDL is less than 70.  With Crestor, LDL is 26 when most recently checked in December. 3. No heart failure symptoms, arrhythmia, syncope, or dyspnea.  Continue to follow expectantly. 4. Not smoking 5. Excellent blood pressure control.  Target 130/80 mmHg or less.  Limit salt in diet. 6. We did not discuss COVID-19.  He has been vaccinated and boosted.  Overall education and awareness concerning secondary risk prevention was discussed in detail: LDL less than 70, hemoglobin A1c less than 7, blood pressure target less than 130/80 mmHg, >150 minutes of moderate aerobic activity per week, avoidance of smoking, weight control (via diet and exercise), and  continued surveillance/management of/for obstructive sleep apnea.    Medication Adjustments/Labs and Tests Ordered: Current medicines are reviewed at length with the patient today.  Concerns regarding medicines are outlined above.  No orders of the defined types were placed in this encounter.  No orders of the defined types were placed in this encounter.   There are no Patient Instructions on file for this visit.   Signed, [name], MD  08/14/2020 9:48 AM    [facility]

## 2020-08-14 ENCOUNTER — Encounter: Payer: Self-pay | Admitting: Interventional Cardiology

## 2020-08-14 ENCOUNTER — Ambulatory Visit (INDEPENDENT_AMBULATORY_CARE_PROVIDER_SITE_OTHER): Payer: Self-pay | Admitting: Interventional Cardiology

## 2020-08-14 ENCOUNTER — Other Ambulatory Visit: Payer: Self-pay

## 2020-08-14 VITALS — BP 126/88 | HR 54 | Ht 68.0 in | Wt 176.6 lb

## 2020-08-14 DIAGNOSIS — Z7189 Other specified counseling: Secondary | ICD-10-CM

## 2020-08-14 DIAGNOSIS — E785 Hyperlipidemia, unspecified: Secondary | ICD-10-CM

## 2020-08-14 DIAGNOSIS — I422 Other hypertrophic cardiomyopathy: Secondary | ICD-10-CM

## 2020-08-14 DIAGNOSIS — I25119 Atherosclerotic heart disease of native coronary artery with unspecified angina pectoris: Secondary | ICD-10-CM

## 2020-08-14 DIAGNOSIS — I10 Essential (primary) hypertension: Secondary | ICD-10-CM

## 2020-08-14 DIAGNOSIS — Z720 Tobacco use: Secondary | ICD-10-CM

## 2020-08-14 NOTE — Patient Instructions (Signed)

## 2020-08-22 ENCOUNTER — Other Ambulatory Visit: Payer: Self-pay | Admitting: Family Medicine

## 2020-08-22 DIAGNOSIS — I25119 Atherosclerotic heart disease of native coronary artery with unspecified angina pectoris: Secondary | ICD-10-CM

## 2020-08-22 MED FILL — RANOLAZINE ER 500 MG TABLET: 500 | 30 days supply | Qty: 60 | Fill #0

## 2020-08-22 MED FILL — CLOPIDOGREL 75 MG TABLET: 75 | 30 days supply | Qty: 30 | Fill #1

## 2020-08-22 MED FILL — LOSARTAN-HCTZ 100-25 MG TAB: 100-25 | 30 days supply | Qty: 30 | Fill #1

## 2020-08-22 NOTE — Telephone Encounter (Signed)
Requested medication (s) are due for refill today: yes  Requested medication (s) are on the active medication list: yes  Last refill:  06/23/20  Future visit scheduled:no  Notes to clinic:  not delegated    Requested Prescriptions  Pending Prescriptions Disp Refills   ranolazine (RANEXA) 500 MG 12 hr tablet [Pharmacy Med Name: RANOLAZINE ER 500 MG TABLET 500 Tablet] 60 tablet 3    Sig: Take 1 tablet (500 mg total) by mouth 2 (two) times daily.      Not Delegated - Cardiovascular: Ranolazine Failed - 08/22/2020  8:24 AM      Failed - This refill cannot be delegated      Passed - K in normal range and within 360 days    Potassium  Date Value Ref Range Status  05/07/2020 3.9 3.5 - 5.2 mmol/L Final          Passed - Valid encounter within last 12 months    Recent Outpatient Visits           3 months ago Essential hypertension   [place] [address] [facility] [place], Cari S, PA-C   4 months ago Rash and nonspecific skin eruption   [place] [facility] [place], [place], MD   9 months ago Essential hypertension   [place] [facility] [place], [place], NP   1 year ago Essential hypertension   [place] [facility] [place], [place], MD   6 years ago URI (upper respiratory infection)   [facility] at [facility], Myrle Sheng, MD       Future Appointments             In 6 months Lyn Records, MD [facility], [facility]

## 2020-09-11 ENCOUNTER — Other Ambulatory Visit: Payer: Self-pay

## 2020-09-11 ENCOUNTER — Ambulatory Visit: Payer: Self-pay | Attending: Physician Assistant | Admitting: Physician Assistant

## 2020-09-11 DIAGNOSIS — M5441 Lumbago with sciatica, right side: Secondary | ICD-10-CM

## 2020-09-11 DIAGNOSIS — M62838 Other muscle spasm: Secondary | ICD-10-CM

## 2020-09-11 MED ORDER — PREDNISONE 10 MG PO TABS
ORAL_TABLET | ORAL | 0 refills | Status: DC
  Filled 2020-09-11: qty 21, 6d supply, fill #0

## 2020-09-11 MED ORDER — METHOCARBAMOL 500 MG PO TABS
500.0000 mg | ORAL_TABLET | Freq: Four times a day (QID) | ORAL | 0 refills | Status: DC | PRN
  Filled 2020-09-11: qty 60, 15d supply, fill #0

## 2020-09-11 NOTE — Progress Notes (Signed)
Virtual Visit via Telephone Note  I connected with [name] on 09/11/20 at 11:10 AM EDT by telephone and verified that I am speaking with the correct person using two identifiers.  Location: Patient: home Provider: [facility] office   I discussed the limitations, risks, security and privacy concerns of performing an evaluation and management service by telephone and the availability of in person appointments. I also discussed with the patient that there may be a patient responsible charge related to this service. The patient expressed understanding and agreed to proceed.   History of Present Illness:  Patient is a Nutritional therapist.  He has been having low back pain for a couple of weeks.  Tylenol has helped only a little.  He is on blood thinners so avoids NSAIDS.  Last labs December and blood sugar was normal.  Pain is also going into R leg.  NKI.  No paresthesias or weakness.    Observations/Objective: NAD.  A&Ox3   Assessment and Plan: 1. Right-sided low back pain with right-sided sciatica, unspecified chronicity No immediate red flags - DG Lumbar Spine Complete; Future - predniSONE (DELTASONE) 10 MG tablet; 6,5,4,3,2,1 take each days dose in am with food  Dispense: 21 tablet; Refill: 0 Blood sugar=85 05/07/2020 2. Muscle spasm - DG Lumbar Spine Complete; Future - methocarbamol (ROBAXIN) 500 MG tablet; Take 1 tablet (500 mg total) by mouth every 6 (six) hours as needed for muscle spasms.  Dispense: 60 tablet; Refill: 0    Follow Up Instructions: See PCP in 2-3 months for chronic conditions   I discussed the assessment and treatment plan with the patient. The patient was provided an opportunity to ask questions and all were answered. The patient agreed with the plan and demonstrated an understanding of the instructions.   The patient was advised to call back or seek an in-person evaluation if the symptoms worsen or if the condition fails to improve as anticipated.  I provided 13 minutes  of non-face-to-face time during this encounter.   Georgian Co, PA-C  Patient ID: [name], male   DOB: May 28, 1961, 60 y.o.   MRN: [id number]

## 2020-09-15 ENCOUNTER — Other Ambulatory Visit: Payer: Self-pay | Admitting: Family

## 2020-09-15 DIAGNOSIS — I25119 Atherosclerotic heart disease of native coronary artery with unspecified angina pectoris: Secondary | ICD-10-CM

## 2020-09-15 DIAGNOSIS — E785 Hyperlipidemia, unspecified: Secondary | ICD-10-CM

## 2020-09-16 ENCOUNTER — Other Ambulatory Visit: Payer: Self-pay

## 2020-09-17 ENCOUNTER — Other Ambulatory Visit: Payer: Self-pay

## 2020-09-17 MED ORDER — ROSUVASTATIN CALCIUM 40 MG PO TABS
ORAL_TABLET | Freq: Every day | ORAL | 0 refills | Status: DC
  Filled 2020-09-17 – 2020-10-03 (×2): qty 30, 30d supply, fill #0
  Filled 2020-11-11: qty 30, 30d supply, fill #1

## 2020-09-24 ENCOUNTER — Other Ambulatory Visit: Payer: Self-pay

## 2020-09-27 ENCOUNTER — Emergency Department (HOSPITAL_COMMUNITY): Payer: No Typology Code available for payment source

## 2020-09-27 ENCOUNTER — Other Ambulatory Visit: Payer: Self-pay

## 2020-09-27 ENCOUNTER — Emergency Department (HOSPITAL_COMMUNITY)
Admission: EM | Admit: 2020-09-27 | Discharge: 2020-09-27 | Disposition: A | Discharge status: Home / Self Care | Payer: No Typology Code available for payment source | Attending: Emergency Medicine | Admitting: Emergency Medicine

## 2020-09-27 ENCOUNTER — Encounter (HOSPITAL_COMMUNITY): Payer: Self-pay

## 2020-09-27 DIAGNOSIS — Z7902 Long term (current) use of antithrombotics/antiplatelets: Secondary | ICD-10-CM | POA: Diagnosis not present

## 2020-09-27 DIAGNOSIS — S199XXA Unspecified injury of neck, initial encounter: Secondary | ICD-10-CM | POA: Diagnosis present

## 2020-09-27 DIAGNOSIS — N182 Chronic kidney disease, stage 2 (mild): Secondary | ICD-10-CM | POA: Diagnosis not present

## 2020-09-27 DIAGNOSIS — Z79899 Other long term (current) drug therapy: Secondary | ICD-10-CM | POA: Insufficient documentation

## 2020-09-27 DIAGNOSIS — F17210 Nicotine dependence, cigarettes, uncomplicated: Secondary | ICD-10-CM | POA: Diagnosis not present

## 2020-09-27 DIAGNOSIS — I129 Hypertensive chronic kidney disease with stage 1 through stage 4 chronic kidney disease, or unspecified chronic kidney disease: Secondary | ICD-10-CM | POA: Diagnosis not present

## 2020-09-27 DIAGNOSIS — Y92410 Unspecified street and highway as the place of occurrence of the external cause: Secondary | ICD-10-CM | POA: Diagnosis not present

## 2020-09-27 DIAGNOSIS — S161XXA Strain of muscle, fascia and tendon at neck level, initial encounter: Secondary | ICD-10-CM | POA: Diagnosis not present

## 2020-09-27 DIAGNOSIS — S46912A Strain of unspecified muscle, fascia and tendon at shoulder and upper arm level, left arm, initial encounter: Secondary | ICD-10-CM | POA: Diagnosis not present

## 2020-09-27 DIAGNOSIS — I25110 Atherosclerotic heart disease of native coronary artery with unstable angina pectoris: Secondary | ICD-10-CM | POA: Insufficient documentation

## 2020-09-27 DIAGNOSIS — Z7982 Long term (current) use of aspirin: Secondary | ICD-10-CM | POA: Diagnosis not present

## 2020-09-27 MED ORDER — ACETAMINOPHEN 500 MG PO TABS
1000.0000 mg | ORAL_TABLET | Freq: Four times a day (QID) | ORAL | 0 refills | Status: DC | PRN
  Filled 2020-09-27: qty 30, 4d supply, fill #0

## 2020-09-27 MED ORDER — METHOCARBAMOL 500 MG PO TABS
500.0000 mg | ORAL_TABLET | Freq: Four times a day (QID) | ORAL | 0 refills | Status: DC | PRN
  Filled 2020-09-27: qty 20, 5d supply, fill #0
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
## 2020-09-27 NOTE — Discharge Instructions (Signed)
1.  Take extra strength Tylenol every 6 hours as needed for pain.  This medication will not interfere with your blood pressure medications, Plavix or aspirin. 2.  Take Robaxin every 6 hours as needed for muscle spasms.  You may occasionally take a dose of 1000 mg (2 tablets) if needed. 3.  Make an appointment to follow-up with your doctor for recheck in 5 to 7 days.
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
## 2020-09-29 ENCOUNTER — Other Ambulatory Visit: Payer: Self-pay

## 2020-09-30 ENCOUNTER — Other Ambulatory Visit: Payer: Self-pay | Admitting: Physician Assistant

## 2020-09-30 DIAGNOSIS — I10 Essential (primary) hypertension: Secondary | ICD-10-CM

## 2020-09-30 MED FILL — Clopidogrel Bisulfate Tab 75 MG (Base Equiv): ORAL | 30 days supply | Qty: 30 | Fill #0 | Status: AC

## 2020-10-01 ENCOUNTER — Other Ambulatory Visit: Payer: Self-pay

## 2020-10-03 ENCOUNTER — Other Ambulatory Visit: Payer: Self-pay

## 2020-10-07 ENCOUNTER — Other Ambulatory Visit: Payer: Self-pay

## 2020-10-09 ENCOUNTER — Other Ambulatory Visit: Payer: Self-pay

## 2020-10-09 MED ORDER — LOSARTAN POTASSIUM-HCTZ 100-25 MG PO TABS
1.0000 | ORAL_TABLET | Freq: Every day | ORAL | 1 refills | Status: DC
  Filled 2020-10-09: qty 30, 30d supply, fill #0
  Filled 2020-11-11: qty 30, 30d supply, fill #1

## 2020-10-10 ENCOUNTER — Other Ambulatory Visit: Payer: Self-pay

## 2020-10-13 ENCOUNTER — Other Ambulatory Visit: Payer: Self-pay

## 2020-10-13 ENCOUNTER — Ambulatory Visit: Payer: Self-pay | Admitting: Physician Assistant

## 2020-10-13 VITALS — BP 140/86 | HR 62 | Temp 98.1°F | Resp 18 | Ht 68.0 in | Wt 175.0 lb

## 2020-10-13 DIAGNOSIS — M778 Other enthesopathies, not elsewhere classified: Secondary | ICD-10-CM

## 2020-10-13 DIAGNOSIS — M25511 Pain in right shoulder: Secondary | ICD-10-CM

## 2020-10-13 DIAGNOSIS — M25512 Pain in left shoulder: Secondary | ICD-10-CM

## 2020-10-13 MED ORDER — PREDNISONE 10 MG PO TABS
ORAL_TABLET | ORAL | 0 refills | Status: AC
Start: 2020-10-13 — End: 2020-10-29
  Filled 2020-10-13: qty 30, 12d supply, fill #0

## 2020-10-13 NOTE — Patient Instructions (Signed)
I encourage you to take the steroid taper as directed.  Make sure that you are getting plenty of rest, drinking plenty of fluids and using ice as needed.  I started a referral for further evaluation by orthopedics.  Please let us know if there is anything else we can do for you.  Roney Jaffe, PA-C Physician Assistant [facility] [email or web address]    Tendinitis  Tendinitis is inflammation of a tendon. A tendon is a strong cord of tissue that connects muscle to bone. Tendinitis can affect any tendon, but it most commonly affects the:  Shoulder tendon (rotator cuff).  Ankle tendon (Achilles tendon).  Elbow tendon (triceps tendon).  Tendons in the wrist. What are the causes? This condition may be caused by:  Overusing a tendon or muscle. This is common.  Age-related wear and tear.  Injury.  Inflammatory conditions, such as arthritis.  Certain medicines. What increases the risk? You are more likely to develop this condition if you do activities that involve the same movements over and over again (repetitive motions). What are the signs or symptoms? Symptoms of this condition may include:  Pain.  Tenderness.  Mild swelling.  Decreased range of motion. How is this diagnosed? This condition is diagnosed with a physical exam. You may also have tests, such as:  Ultrasound. This uses sound waves to make an image of the inside of your body in the affected area.  MRI. How is this treated? This condition may be treated by resting, icing, applying pressure (compression), and raising (elevating) the affected area above the level of your heart. This is known as RICE therapy. Treatment may also include:  Medicines to help reduce inflammation or to help reduce pain.  Exercises or physical therapy to strengthen and stretch the tendon.  A brace or splint.  Surgery. This is rarely needed. Follow these instructions  at home: If you have a splint or brace:  Wear the splint or brace as told by your health care provider. Remove it only as told by your health care provider.  Loosen the splint or brace if your fingers or toes tingle, become numb, or turn cold and blue.  Keep the splint or brace clean.  If the splint or brace is not waterproof: ? Do not let it get wet. ? Cover it with a watertight covering when you take a bath or shower. Managing pain, stiffness, and swelling  If directed, put ice on the affected area. ? If you have a removable splint or brace, remove it as told by your health care provider. ? Put ice in a plastic bag. ? Place a towel between your skin and the bag. ? Leave the ice on for 20 minutes, 2-3 times a day.  Move the fingers or toes of the affected limb often, if this applies. This can help to prevent stiffness and lessen swelling.  If directed, raise (elevate) the affected area above the level of your heart while you are sitting or lying down.   If directed, apply heat to the affected area before you exercise. Use the heat source that your health care provider recommends, such as a moist heat pack or a heating pad. ? Place a towel between your skin and the heat source. ? Leave the heat on for 20-30 minutes. ? Remove the heat if your skin turns bright red. This is especially important if you are unable to feel pain, heat, or cold. You may have a greater risk of getting burned.  Driving  Do not drive or use heavy machinery while taking prescription pain medicine.  Ask your health care provider when it is safe to drive if you have a splint or brace on any part of your arm or leg. Activity  Rest the affected area as told by your health care provider.  Return to your normal activities as told by your health care provider. Ask your health care provider what activities are safe for you.  Avoid using the affected area while you are experiencing symptoms of  tendinitis.  Do exercises as told by your health care provider. General instructions  If you have a splint, do not put pressure on any part of the splint until it is fully hardened. This may take several hours.  Wear an elastic bandage or compression wrap only as told by your health care provider.  Take over-the-counter and prescription medicines only as told by your health care provider.  Keep all follow-up visits as told by your health care provider. This is important. Contact a health care provider if:  Your symptoms do not improve.  You develop new, unexplained problems, such as numbness in your hands. Summary  Tendinitis is inflammation of a tendon.  You are more likely to develop this condition if you do activities that involve the same movements over and over again.  This condition may be treated by resting, icing, applying pressure (compression), and elevating the area above the level of your heart. This is known as RICE therapy.  Avoid using the affected area while you are experiencing symptoms of tendinitis. This information is not intended to replace advice given to you by your health care provider. Make sure you discuss any questions you have with your health care provider. Document Revised: 11/22/2017 Document Reviewed: 10/05/2017 Elsevier Patient Education  2021 [employer].
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
## 2020-10-16 ENCOUNTER — Ambulatory Visit (INDEPENDENT_AMBULATORY_CARE_PROVIDER_SITE_OTHER): Payer: Self-pay | Admitting: Family Medicine

## 2020-10-16 ENCOUNTER — Encounter: Payer: Self-pay | Admitting: Family Medicine

## 2020-10-16 ENCOUNTER — Other Ambulatory Visit: Payer: Self-pay

## 2020-10-16 DIAGNOSIS — M25512 Pain in left shoulder: Secondary | ICD-10-CM

## 2020-10-16 DIAGNOSIS — M25511 Pain in right shoulder: Secondary | ICD-10-CM

## 2020-10-16 MED ORDER — BACLOFEN 10 MG PO TABS
5.0000 mg | ORAL_TABLET | Freq: Three times a day (TID) | ORAL | 3 refills | Status: DC | PRN
  Filled 2020-10-16: qty 30, 10d supply, fill #0

## 2020-10-16 MED ORDER — METHYLPREDNISOLONE 4 MG PO TBPK
ORAL_TABLET | ORAL | 0 refills | Status: DC
  Filled 2020-10-16: qty 21, 6d supply, fill #0
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
## 2020-10-17 ENCOUNTER — Other Ambulatory Visit (HOSPITAL_COMMUNITY): Payer: Self-pay

## 2020-10-17 ENCOUNTER — Other Ambulatory Visit: Payer: Self-pay

## 2020-10-23 ENCOUNTER — Other Ambulatory Visit: Payer: Self-pay

## 2020-10-23 ENCOUNTER — Ambulatory Visit (INDEPENDENT_AMBULATORY_CARE_PROVIDER_SITE_OTHER): Payer: Self-pay | Admitting: Rehabilitative and Restorative Service Providers"

## 2020-10-23 ENCOUNTER — Encounter: Payer: Self-pay | Admitting: Rehabilitative and Restorative Service Providers"

## 2020-10-23 DIAGNOSIS — M25511 Pain in right shoulder: Secondary | ICD-10-CM

## 2020-10-23 DIAGNOSIS — M6281 Muscle weakness (generalized): Secondary | ICD-10-CM

## 2020-10-23 DIAGNOSIS — R293 Abnormal posture: Secondary | ICD-10-CM

## 2020-10-23 DIAGNOSIS — M25512 Pain in left shoulder: Secondary | ICD-10-CM
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
## 2020-10-23 NOTE — Patient Instructions (Signed)
Access Code: [email or web address] URL: [email or web address] Date: 10/23/2020 Prepared by: Chyrel Masson  Exercises Supine Shoulder Flexion Extension AAROM with Dowel - 2 x daily - 7 x weekly - 2 sets - 10 reps - 5 hold Supine Shoulder Flexion Extension Full Range AROM - 2 x daily - 7 x weekly - 2 sets - 10 reps Shoulder External Rotation and Scapular Retraction with Resistance - 2 x daily - 7 x weekly - 3 sets - 10 reps Prone Scapular Retraction - 2 x daily - 7 x weekly - 1 sets - 10 reps - 5 hold Prone Scapular Slide with Shoulder Extension - 2 x daily - 7 x weekly - 1 sets - 10 reps - 5 hold

## 2020-11-06 ENCOUNTER — Ambulatory Visit (INDEPENDENT_AMBULATORY_CARE_PROVIDER_SITE_OTHER): Payer: Self-pay | Admitting: Rehabilitative and Restorative Service Providers"

## 2020-11-06 ENCOUNTER — Encounter: Payer: Self-pay | Admitting: Rehabilitative and Restorative Service Providers"

## 2020-11-06 ENCOUNTER — Other Ambulatory Visit: Payer: Self-pay

## 2020-11-06 DIAGNOSIS — M25511 Pain in right shoulder: Secondary | ICD-10-CM

## 2020-11-06 DIAGNOSIS — R293 Abnormal posture: Secondary | ICD-10-CM

## 2020-11-06 DIAGNOSIS — M25512 Pain in left shoulder: Secondary | ICD-10-CM

## 2020-11-06 DIAGNOSIS — M6281 Muscle weakness (generalized): Secondary | ICD-10-CM

## 2020-11-06 NOTE — Therapy (Addendum)
[facility] Physical Therapy [address] [place], [place], [address] Phone: [phone]   Fax:  [phone]  Physical Therapy Treatment  Patient Details  Name: [name] MRN: [id number] Date of Birth: 1960-07-16 Referring Provider (PT): Dr. Prince Rome   Encounter Date: 11/06/2020    11/06/20 1347  PT Visits / Re-Eval  Visit Number 2  Number of Visits 12  Date for PT Re-Evaluation 12/18/20  Authorization  Progress Note Due on Visit 10  PT Time Calculation  PT Start Time 1345  PT Stop Time 1425  PT Time Calculation (min) 40 min  PT - End of Session  Activity Tolerance Patient tolerated treatment well  Behavior During Therapy [facility] for tasks assessed/performed    Past Medical History:  Diagnosis Date   Apical variant hypertrophic cardiomyopathy (HCC)    CAD (coronary artery disease)    a. LHC in 2011 with severe dz in D1/D2 and very distal LAD--> too small for PCI and managed medically.  b.  LHC in 10/2013 after an abnormal nuclear study w/ non-obst dz/c. 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   HOH (hard of hearing)    rgiht ear   Hyperlipidemia    Hypertension    Migraine headache    Stroke [facility])     Past Surgical History:  Procedure Laterality Date   CARDIAC CATHETERIZATION N/A 02/24/2016   Procedure: Left Heart Cath and Coronary Angiography;  Surgeon: Tonny Bollman, MD;  Location: [facility] INVASIVE CV LAB;  Service: Cardiovascular;  Laterality: N/A;   CARDIAC CATHETERIZATION N/A 02/24/2016   Procedure: Coronary Stent Intervention;  Surgeon: Tonny Bollman, MD;  Location: [facility] INVASIVE CV LAB;  Service: Cardiovascular;   Laterality: N/A;   CORONARY ANGIOPLASTY WITH STENT PLACEMENT  2017   CORONARY BALLOON ANGIOPLASTY N/A 04/30/2019   Procedure: CORONARY BALLOON ANGIOPLASTY;  Surgeon: Swaziland, Peter M, MD;  Location: [facility] INVASIVE CV LAB;  Service: Cardiovascular;  Laterality: N/A;   INGUINAL HERNIA REPAIR     LEFT HEART CATH AND CORONARY ANGIOGRAPHY N/A 04/30/2019   Procedure: LEFT HEART CATH AND CORONARY ANGIOGRAPHY;  Surgeon: Dolores Patty, MD;  Location: [facility] INVASIVE CV LAB;  Service: Cardiovascular;  Laterality: N/A;   LEFT HEART CATH AND CORONARY ANGIOGRAPHY N/A 05/14/2019   Procedure: LEFT HEART CATH AND CORONARY ANGIOGRAPHY;  Surgeon: Swaziland, Peter M, MD;  Location: [facility] INVASIVE CV LAB;  Service: Cardiovascular;  Laterality: N/A;   LEFT HEART CATHETERIZATION WITH CORONARY ANGIOGRAM N/A 11/22/2013   Procedure: LEFT HEART CATHETERIZATION WITH CORONARY ANGIOGRAM;  Surgeon: [name], MD;  Location: [facility] CATH LAB;  Service: Cardiovascular;  Laterality: N/A;   TEE WITHOUT CARDIOVERSION N/A 08/22/2015   Procedure: TRANSESOPHAGEAL ECHOCARDIOGRAM (TEE);  Surgeon: Vesta Mixer, MD;  Location: [facility] ENDOSCOPY;  Service: Cardiovascular;  Laterality: N/A;    There were no vitals filed for this visit.   Subjective Assessment - 11/06/20 1348     Subjective Pt. indicated pain at worst 5/10 in last week or so, no pain today.  Exercises were some tough to start but getting better.    Pertinent History History of Lt shoulder pain in past  about 4 years ago with good improvement.  HTN, CAD, hyperlipidemia, history of stroke    Limitations Lifting;House hold activities;Other (comment)   work   Patient Stated Goals Reduce pain, return to work.    Currently in Pain? No/denies    Pain Score 0-No pain    Pain Onset 1 to 4 weeks ago                [facility] PT Assessment - 11/06/20 0001       Assessment   Medical Diagnosis bilateral shoulder pain    Referring Provider (PT) Dr. Prince Rome    Onset Date/Surgical Date  09/27/20    Hand Dominance Right      Strength   Right Shoulder Flexion 4/5   15.3, 14.8 lbs   Right Shoulder ABduction 4/5   13.5, 14.4 lbs   Left Shoulder Flexion 5/5   26.2, 24.4 lbs   Left Shoulder ABduction 5/5   19.5, 20.7 lbs                          [facility] Adult PT Treatment/Exercise - 11/06/20 0001       Shoulder Exercises: Sidelying   Flexion Both   1 lb 2 x 10 Lt, 0 lb 2 x 10 Rt     Shoulder Exercises: ROM/Strengthening   UBE (Upper Arm Bike) Lvl 3.5 3 mins fwd/back each way                    PT Education - 11/06/20 1402     Education Details HEP progression    Person(s) Educated Patient    Methods Explanation;Demonstration;Verbal cues;Handout    Comprehension Returned demonstration;Verbalized understanding              PT Short Term Goals - 11/06/20 1400       PT SHORT TERM GOAL #1   Title Patient will demonstrate independent use of home exercise program to maintain progress from in clinic treatments.    Time 3    Period Weeks    Status On-going    Target Date 11/13/20               PT Long Term Goals - 10/23/20 0936       PT LONG TERM GOAL #1   Title Patient will demonstrate/report pain at worst less than or equal to 2/10 to facilitate minimal limitation in daily activity secondary to pain symptoms.    Time 8    Period Weeks    Status New    Target Date 12/18/20      PT LONG TERM GOAL #2   Title Patient will demonstrate independent use of home exercise program to facilitate ability to maintain/progress functional gains from skilled physical therapy services.    Time 8    Period Weeks    Status New    Target Date 12/18/20      PT LONG TERM GOAL #3   Title Patient will demonstrate bilateral GH joint mobility WFL to facilitate usual self care, dressing, reaching overhead at PLOF s limitation due to symptoms.    Time 8    Period Weeks    Status New    Target Date 12/18/20      PT LONG TERM GOAL #4   Title  Patient will demonstrate bilateral UE MMT 5/5 throughout to facilitate usual lifting, carrying in functional activity to PLOF s limitation.    Time 8  Period Weeks    Status New    Target Date 12/18/20      PT LONG TERM GOAL #5   Title Pt. will demonstrate FOTO outcome > or = 58 to indicated reduced disability.    Time 8    Period Weeks    Status New    Target Date 12/18/20                   Plan - 11/06/20 1358     Clinical Impression Statement Pt. demonstrated improved quality of movement and progression in some strength testing today compared to evaluation.  Progressed home intervention to include progressive strengthening in gravity reduced positoning to improve strength further, particularly on Rt side.    Personal Factors and Comorbidities Comorbidity 3+    Comorbidities HTN, CAD, hyperlipidemia, history of stroke, history of Lt shoulder pain previously    Examination-Activity Limitations Sleep;Carry;Dressing;Lift;Reach Overhead    Examination-Participation Restrictions Occupation;Community Activity;Cleaning;Yard Work    Stability/Clinical Decision Making Stable/Uncomplicated    PT Frequency --   1-2x/week   PT Duration 8 weeks    PT Treatment/Interventions ADLs/Self Care Home Management;Ultrasound;Cryotherapy;Electrical Stimulation;Iontophoresis 4mg /ml Dexamethasone;Moist Heat;Traction;Balance training;Therapeutic exercise;Therapeutic activities;Functional mobility training;Stair training;Gait training;DME Instruction;Neuromuscular re-education;Patient/family education;Passive range of motion;Spinal Manipulations;Dry needling;Joint Manipulations;Taping;Manual techniques    PT Next Visit Plan Possible dry needling for infraspinatus if desired .  Continue progressive strengthening plan.    PT Home Exercise Plan [id number]    Consulted and Agree with Plan of Care Patient             Patient will benefit from skilled therapeutic intervention in order to improve the  following deficits and impairments:  Decreased endurance, Hypomobility, Decreased activity tolerance, Decreased strength, Increased fascial restricitons, Impaired UE functional use, Pain, Decreased mobility, Decreased range of motion, Impaired perceived functional ability, Improper body mechanics, Postural dysfunction, Impaired flexibility, Decreased coordination  Visit Diagnosis: Acute pain of left shoulder  Acute pain of right shoulder  Muscle weakness (generalized)  Abnormal posture     Problem List 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   Spinal stenosis at L4-L5 level 09/22/2016   Unstable angina (HCC) 02/25/2016   NSTEMI (non-ST elevated myocardial infarction) (HCC) 02/24/2016   Bradycardia    AV block    Thiamine deficiency 11/19/2015   Abnormal brain MRI 11/19/2015   Depression with somatization 11/19/2015   Acute pain of left wrist 10/23/2015   Primary osteoarthritis of first carpometacarpal joint of left hand 10/23/2015   Chronic renal insufficiency, stage II (mild) 07/02/2014   Coronary artery disease involving native coronary artery of native heart with angina pectoris (HCC)    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/2013   Drug noncompliance 03/26/2013   Migraine, unspecified, without mention of intractable migraine without mention of status migrainosus 03/26/2013   GERD (gastroesophageal reflux disease) 11/14/2012   Routine general medical examination at a health care facility 07/28/2012   ERECTILE DYSFUNCTION 08/12/2010   Hyperlipidemia with target LDL less than 70 10/15/2009   TOBACCO USE 10/15/2009   Essential hypertension 10/15/2009   Chyrel Masson, PT, DPT, OCS, ATC 11/06/20  2:26 PM  End of session corrections provided -  Chyrel Masson, PT, DPT, OCS,  ATC 11/11/20  12:10 PM   [facility] Physical Therapy [address] [place], [place], [address] Phone: [phone]  Fax:  [phone]  Name: [name] MRN: [id number] Date of Birth: 01-Nov-1960

## 2020-11-06 NOTE — Patient Instructions (Signed)
Access Code: [email or web address] URL: [email or web address] Date: 11/06/2020 Prepared by: Chyrel Masson  Exercises Supine Shoulder Flexion Extension AAROM with Dowel - 2 x daily - 7 x weekly - 2 sets - 10 reps - 5 hold Shoulder External Rotation and Scapular Retraction with Resistance - 2 x daily - 7 x weekly - 3 sets - 10 reps Prone Scapular Retraction - 2 x daily - 7 x weekly - 1 sets - 10 reps - 5 hold Prone Scapular Slide with Shoulder Extension - 2 x daily - 7 x weekly - 1 sets - 10 reps - 5 hold Sidelying Shoulder Flexion 15 Degrees - 1 x daily - 7 x weekly - 3 sets - 10 reps Sidelying Shoulder Abduction Palm Forward - 2 x daily - 7 x weekly - 3 sets - 10-15 reps Shoulder External Rotation with Anchored Resistance - 2 x daily - 7 x weekly - 10 reps - 3 sets

## 2020-11-11 ENCOUNTER — Ambulatory Visit (INDEPENDENT_AMBULATORY_CARE_PROVIDER_SITE_OTHER): Payer: Self-pay | Admitting: Rehabilitative and Restorative Service Providers"

## 2020-11-11 ENCOUNTER — Encounter: Payer: Self-pay | Admitting: Rehabilitative and Restorative Service Providers"

## 2020-11-11 ENCOUNTER — Other Ambulatory Visit: Payer: Self-pay

## 2020-11-11 DIAGNOSIS — M25512 Pain in left shoulder: Secondary | ICD-10-CM

## 2020-11-11 DIAGNOSIS — R293 Abnormal posture: Secondary | ICD-10-CM

## 2020-11-11 DIAGNOSIS — M6281 Muscle weakness (generalized): Secondary | ICD-10-CM

## 2020-11-11 DIAGNOSIS — M25511 Pain in right shoulder: Secondary | ICD-10-CM

## 2020-11-11 MED FILL — Clopidogrel Bisulfate Tab 75 MG (Base Equiv): ORAL | 30 days supply | Qty: 30 | Fill #1 | Status: AC

## 2020-11-11 MED FILL — Ranolazine Tab ER 12HR 500 MG: ORAL | 30 days supply | Qty: 60 | Fill #0 | Status: AC

## 2020-11-11 NOTE — Therapy (Signed)
[facility] Physical Therapy [address] [place], [place], [address] Phone: [phone]   Fax:  [phone]  Physical Therapy Treatment  Patient Details  Name: [name] MRN: [id number] Date of Birth: Feb 11, 1961 Referring Provider (PT): Dr. Prince Rome   Encounter Date: 11/11/2020   PT End of Session - 11/11/20 1143     Visit Number 3    Number of Visits 12    Date for PT Re-Evaluation 12/18/20    Progress Note Due on Visit 10    PT Start Time 1142    PT Stop Time 1220    PT Time Calculation (min) 38 min    Activity Tolerance Patient tolerated treatment well    Behavior During Therapy [facility] for tasks assessed/performed             Past Medical History:  Diagnosis Date   Apical variant hypertrophic cardiomyopathy (HCC)    CAD (coronary artery disease)    a. LHC in 2011 with severe dz in D1/D2 and very distal LAD--> too small for PCI and managed medically.  b.  LHC in 10/2013 after an abnormal nuclear study w/ non-obst dz/c. 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   HOH (hard of hearing)    rgiht ear   Hyperlipidemia    Hypertension    Migraine headache    Stroke [facility])     Past Surgical History:  Procedure Laterality Date   CARDIAC CATHETERIZATION N/A 02/24/2016   Procedure: Left Heart Cath and Coronary Angiography;  Surgeon: Tonny Bollman, MD;  Location: [facility] INVASIVE CV LAB;  Service: Cardiovascular;  Laterality: N/A;   CARDIAC CATHETERIZATION N/A 02/24/2016   Procedure: Coronary Stent Intervention;  Surgeon: Tonny Bollman, MD;  Location: [facility] INVASIVE CV LAB;  Service: Cardiovascular;  Laterality: N/A;   CORONARY  ANGIOPLASTY WITH STENT PLACEMENT  2017   CORONARY BALLOON ANGIOPLASTY N/A 04/30/2019   Procedure: CORONARY BALLOON ANGIOPLASTY;  Surgeon: Swaziland, Peter M, MD;  Location: [facility] INVASIVE CV LAB;  Service: Cardiovascular;  Laterality: N/A;   INGUINAL HERNIA REPAIR     LEFT HEART CATH AND CORONARY ANGIOGRAPHY N/A 04/30/2019   Procedure: LEFT HEART CATH AND CORONARY ANGIOGRAPHY;  Surgeon: Dolores Patty, MD;  Location: [facility] INVASIVE CV LAB;  Service: Cardiovascular;  Laterality: N/A;   LEFT HEART CATH AND CORONARY ANGIOGRAPHY N/A 05/14/2019   Procedure: LEFT HEART CATH AND CORONARY ANGIOGRAPHY;  Surgeon: Swaziland, Peter M, MD;  Location: [facility] INVASIVE CV LAB;  Service: Cardiovascular;  Laterality: N/A;   LEFT HEART CATHETERIZATION WITH CORONARY ANGIOGRAM N/A 11/22/2013   Procedure: LEFT HEART CATHETERIZATION WITH CORONARY ANGIOGRAM;  Surgeon: [name], MD;  Location: [facility] CATH LAB;  Service: Cardiovascular;  Laterality: N/A;   TEE WITHOUT CARDIOVERSION N/A 08/22/2015   Procedure: TRANSESOPHAGEAL ECHOCARDIOGRAM (TEE);  Surgeon: Vesta Mixer, MD;  Location: [facility] ENDOSCOPY;  Service: Cardiovascular;  Laterality: N/A;    There were no vitals filed for this visit.   Subjective Assessment - 11/11/20 1144     Subjective Pt. indicated doing better than he was before.  Pt. stated indicated doing exercises and felt sore and tired from doing  then.  Pt. indicated the he felt like he has still stopped doing carrying, digging holes.    Pertinent History History of Lt shoulder pain in past about 4 years ago with good improvement.  HTN, CAD, hyperlipidemia, history of stroke    Limitations Lifting;House hold activities;Other (comment)   work   Patient Stated Goals Reduce pain, return to work.    Currently in Pain? No/denies    Pain Score 0-No pain    Pain Location Shoulder    Pain Onset 1 to 4 weeks ago                               [facility] Adult PT Treatment/Exercise - 11/11/20 0001        Shoulder Exercises: Standing   External Rotation Both   towel at side 3 x 10 bilateral green band   Theraband Level (Shoulder External Rotation) Level 3 (Green)    Other Standing Exercises flexion to abduction to side to abduction to flexion to front 2 lbs 2 x 10 90 degrees      Shoulder Exercises: ROM/Strengthening   UBE (Upper Arm Bike) Lvl 4.0 4 mins fwd/back each way c 15 second intervall faster at :45 to :60 each minute    Lat Pull Other (comment)   25 lbs 3 x 15   Cybex Press Other (comment)   3 x 15 25 lbs   Cybex Row Other (comment)   3 x 15 25 lbs bilateral                     PT Short Term Goals - 11/11/20 1146       PT SHORT TERM GOAL #1   Title Patient will demonstrate independent use of home exercise program to maintain progress from in clinic treatments.    Time 3    Period Weeks    Status Achieved    Target Date 11/13/20               PT Long Term Goals - 11/11/20 1147       PT LONG TERM GOAL #1   Title Patient will demonstrate/report pain at worst less than or equal to 2/10 to facilitate minimal limitation in daily activity secondary to pain symptoms.    Time 8    Period Weeks    Status On-going    Target Date 12/18/20      PT LONG TERM GOAL #2   Title Patient will demonstrate independent use of home exercise program to facilitate ability to maintain/progress functional gains from skilled physical therapy services.    Time 8    Period Weeks    Status On-going    Target Date 12/18/20      PT LONG TERM GOAL #3   Title Patient will demonstrate bilateral GH joint mobility WFL to facilitate usual self care, dressing, reaching overhead at PLOF s limitation due to symptoms.    Time 8    Period Weeks    Status On-going    Target Date 12/18/20      PT LONG TERM GOAL #4   Title Patient will demonstrate bilateral UE MMT 5/5 throughout to facilitate usual lifting, carrying in functional activity to PLOF s limitation.    Time 8    Period  Weeks    Status On-going    Target Date 12/18/20      PT LONG TERM GOAL #5   Title  Pt. will demonstrate FOTO outcome > or = 58 to indicated reduced disability.    Time 8    Period Weeks    Status On-going    Target Date 12/18/20                   Plan - 11/11/20 1150     Clinical Impression Statement Overall symptoms indicates are minimal to no pain since last visit.  Advice given to progress return to usual work activity and reassessment next visit on FOTO outcome, strength and activity tolerance.    Personal Factors and Comorbidities Comorbidity 3+    Comorbidities HTN, CAD, hyperlipidemia, history of stroke, history of Lt shoulder pain previously    Examination-Activity Limitations Sleep;Carry;Dressing;Lift;Reach Overhead    Examination-Participation Restrictions Occupation;Community Activity;Cleaning;Yard Work    Stability/Clinical Decision Making Stable/Uncomplicated    PT Frequency --   1-2x/week   PT Duration 8 weeks    PT Treatment/Interventions ADLs/Self Care Home Management;Ultrasound;Cryotherapy;Electrical Stimulation;Iontophoresis 4mg /ml Dexamethasone;Moist Heat;Traction;Balance training;Therapeutic exercise;Therapeutic activities;Functional mobility training;Stair training;Gait training;DME Instruction;Neuromuscular re-education;Patient/family education;Passive range of motion;Spinal Manipulations;Dry needling;Joint Manipulations;Taping;Manual techniques    PT Next Visit Plan Strengthening plan, Dry needling if needed.  FOTO reassessment, strength reassessment    PT Home Exercise Plan    Consulted and Agree with Plan of Care Patient             Patient will benefit from skilled therapeutic intervention in order to improve the following deficits and impairments:  Decreased endurance, Hypomobility, Decreased activity tolerance, Decreased strength, Increased fascial restricitons, Impaired UE functional use, Pain, Decreased mobility, Decreased range of  motion, Impaired perceived functional ability, Improper body mechanics, Postural dysfunction, Impaired flexibility, Decreased coordination  Visit Diagnosis: Acute pain of left shoulder  Acute pain of right shoulder  Muscle weakness (generalized)  Abnormal posture     Problem List 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   Spinal stenosis at L4-L5 level 09/22/2016   Unstable angina (HCC) 02/25/2016   NSTEMI (non-ST elevated myocardial infarction) (HCC) 02/24/2016   Bradycardia    AV block    Thiamine deficiency 11/19/2015   Abnormal brain MRI 11/19/2015   Depression with somatization 11/19/2015   Acute pain of left wrist 10/23/2015   Primary osteoarthritis of first carpometacarpal joint of left hand 10/23/2015   Chronic renal insufficiency, stage II (mild) 07/02/2014   Coronary artery disease involving native coronary artery of native heart with angina pectoris (HCC)    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/2013   Drug noncompliance 03/26/2013   Migraine, unspecified, without mention of intractable migraine without mention of status migrainosus 03/26/2013   GERD (gastroesophageal reflux disease) 11/14/2012   Routine general medical examination at a health care facility 07/28/2012   ERECTILE DYSFUNCTION 08/12/2010   Hyperlipidemia with target LDL less than 70 10/15/2009   TOBACCO USE 10/15/2009   Essential hypertension 10/15/2009   10/17/2009, PT, DPT, OCS, ATC 11/11/20  12:23 PM    [facility] Physical Therapy [address] [place], [place], [place] Phone: [phone]   Fax:  [phone]  Name: [name] MRN: [name] Date of Birth: [birth year]

## 2020-11-12 ENCOUNTER — Other Ambulatory Visit: Payer: Self-pay

## 2020-11-13 ENCOUNTER — Ambulatory Visit: Payer: Self-pay | Admitting: Family Medicine

## 2020-11-18 ENCOUNTER — Ambulatory Visit (INDEPENDENT_AMBULATORY_CARE_PROVIDER_SITE_OTHER): Payer: Self-pay | Admitting: Rehabilitative and Restorative Service Providers"

## 2020-11-18 ENCOUNTER — Encounter: Payer: Self-pay | Admitting: Family Medicine

## 2020-11-18 ENCOUNTER — Ambulatory Visit (INDEPENDENT_AMBULATORY_CARE_PROVIDER_SITE_OTHER): Payer: Self-pay | Admitting: Family Medicine

## 2020-11-18 ENCOUNTER — Encounter: Payer: Self-pay | Admitting: Rehabilitative and Restorative Service Providers"

## 2020-11-18 ENCOUNTER — Other Ambulatory Visit: Payer: Self-pay

## 2020-11-18 DIAGNOSIS — M6281 Muscle weakness (generalized): Secondary | ICD-10-CM

## 2020-11-18 DIAGNOSIS — M25511 Pain in right shoulder: Secondary | ICD-10-CM

## 2020-11-18 DIAGNOSIS — R293 Abnormal posture: Secondary | ICD-10-CM

## 2020-11-18 DIAGNOSIS — M25512 Pain in left shoulder: Secondary | ICD-10-CM

## 2020-11-18 NOTE — Therapy (Addendum)
[facility] [address] [place], [place], [address] Phone: [phone]   Fax:  [phone]  Physical Therapy Treatment/Discharge  Patient Details  Name: [name] MRN: [id number] Date of Birth: 02-28-1961 Referring Provider (PT): Dr. Junius Roads   Encounter Date: 11/18/2020   PT End of Session - 11/18/20 1157     Visit Number 4    Number of Visits 12    Date for PT Re-Evaluation 12/18/20    Progress Note Due on Visit 10    PT Start Time 1154    PT Stop Time 1219    PT Time Calculation (min) 25 min    Activity Tolerance Patient tolerated treatment well    Behavior During Therapy [facility] for tasks assessed/performed             Past Medical History:  Diagnosis Date   Apical variant hypertrophic cardiomyopathy ([place])    CAD (coronary artery disease)    a. LHC in 2011 with severe dz in D1/D2 and very distal LAD--> too small for PCI and managed medically.  b.  LHC in 10/2013 after an abnormal nuclear study w/ non-obst dz/c. 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   HOH (hard of hearing)    rgiht ear   Hyperlipidemia    Hypertension    Migraine headache    Stroke [facility])     Past Surgical History:  Procedure Laterality Date   CARDIAC CATHETERIZATION N/A 02/24/2016   Procedure: Left Heart Cath and Coronary Angiography;  Surgeon: Sherren Mocha, MD;  Location: [place] CV LAB;  Service: Cardiovascular;  Laterality: N/A;   CARDIAC CATHETERIZATION N/A 02/24/2016   Procedure: Coronary Stent Intervention;  Surgeon: Sherren Mocha, MD;  Location: [place] CV LAB;  Service: Cardiovascular;  Laterality: N/A;   CORONARY  ANGIOPLASTY WITH STENT PLACEMENT  2017   CORONARY BALLOON ANGIOPLASTY N/A 04/30/2019   Procedure: CORONARY BALLOON ANGIOPLASTY;  Surgeon: Martinique, Peter M, MD;  Location: [place] CV LAB;  Service: Cardiovascular;  Laterality: N/A;   INGUINAL HERNIA REPAIR     LEFT HEART CATH AND CORONARY ANGIOGRAPHY N/A 04/30/2019   Procedure: LEFT HEART CATH AND CORONARY ANGIOGRAPHY;  Surgeon: Jolaine Artist, MD;  Location: [place] CV LAB;  Service: Cardiovascular;  Laterality: N/A;   LEFT HEART CATH AND CORONARY ANGIOGRAPHY N/A 05/14/2019   Procedure: LEFT HEART CATH AND CORONARY ANGIOGRAPHY;  Surgeon: Martinique, Peter M, MD;  Location: [place] CV LAB;  Service: Cardiovascular;  Laterality: N/A;   LEFT HEART CATHETERIZATION WITH CORONARY ANGIOGRAM N/A 11/22/2013   Procedure: LEFT HEART CATHETERIZATION WITH CORONARY ANGIOGRAM;  Surgeon: [name], MD;  Location: [facility] CATH LAB;  Service: Cardiovascular;  Laterality: N/A;   TEE WITHOUT CARDIOVERSION N/A 08/22/2015   Procedure: TRANSESOPHAGEAL ECHOCARDIOGRAM (TEE);  Surgeon: Thayer Headings, MD;  Location: [place];  Service: Cardiovascular;  Laterality: N/A;    There were no vitals filed for this visit.   Subjective Assessment - 11/18/20 1158     Subjective Pt. indicated no negative reaction to returning to some work activity.  Saw MD today and indicated good visit.  Pertinent History History of Lt shoulder pain in past about 4 years ago with good improvement.  HTN, CAD, hyperlipidemia, history of stroke    Limitations Lifting;House hold activities;Other (comment)   work   Patient Stated Goals Reduce pain, return to work.    Currently in Pain? No/denies    Pain Score 0-No pain    Pain Onset 1 to 4 weeks ago                [facility] PT Assessment - 11/18/20 0001       Assessment   Medical Diagnosis bilateral shoulder pain    Referring Provider (PT) Dr. Junius Roads    Onset Date/Surgical Date 09/27/20    Hand Dominance Right       Observation/Other Assessments   Focus on Therapeutic Outcomes (FOTO)  83% update                           [facility] Adult PT Treatment/Exercise - 11/18/20 0001       Exercises   Other Exercises  HEP review      Shoulder Exercises: ROM/Strengthening   UBE (Upper Arm Bike) Lvl 4.0 4 mins fwd/back each way c 15 second intervall faster at :45 to :60 each minute    Lat Pull Other (comment)   2 x 15 25 lbs   Cybex Press Other (comment)   2 x 15 25 lbs   Cybex Row Other (comment)   2x 15 25 lbs                   PT Education - 11/18/20 1205     Education Details HEP review    Person(s) Educated Patient    Methods Explanation    Comprehension Verbalized understanding              PT Short Term Goals - 11/11/20 1146       PT SHORT TERM GOAL #1   Title Patient will demonstrate independent use of home exercise program to maintain progress from in clinic treatments.    Time 3    Period Weeks    Status Achieved    Target Date 11/13/20               PT Long Term Goals - 11/11/20 1147       PT LONG TERM GOAL #1   Title Patient will demonstrate/report pain at worst less than or equal to 2/10 to facilitate minimal limitation in daily activity secondary to pain symptoms.    Time 8    Period Weeks    Status On-going    Target Date 12/18/20      PT LONG TERM GOAL #2   Title Patient will demonstrate independent use of home exercise program to facilitate ability to maintain/progress functional gains from skilled physical therapy services.    Time 8    Period Weeks    Status On-going    Target Date 12/18/20      PT LONG TERM GOAL #3   Title Patient will demonstrate bilateral [place] joint mobility WFL to facilitate usual self care, dressing, reaching overhead at PLOF s limitation due to symptoms.    Time 8    Period Weeks    Status On-going    Target Date 12/18/20      PT LONG TERM GOAL #4   Title Patient will demonstrate bilateral UE MMT 5/5  throughout to facilitate usual lifting, carrying in functional activity  to PLOF s limitation.    Time 8    Period Weeks    Status On-going    Target Date 12/18/20      PT LONG TERM GOAL #5   Title Pt. will demonstrate FOTO outcome > or = 58 to indicated reduced disability.    Time 8    Period Weeks    Status On-going    Target Date 12/18/20                   Plan - 11/18/20 1205     Clinical Impression Statement Pt. has attended 4 visits and shown good improvement on FOTO update today.  Due to improvement in symptoms and ability to perform good HEP at this time, Pt. to trial HEP only with return prn.    Personal Factors and Comorbidities Comorbidity 3+    Comorbidities HTN, CAD, hyperlipidemia, history of stroke, history of Lt shoulder pain previously    Examination-Activity Limitations Sleep;Carry;Dressing;Lift;Reach Overhead    Examination-Participation Restrictions Occupation;Community Activity;Cleaning;Yard Work    Stability/Clinical Decision Making Stable/Uncomplicated    PT Frequency --   1-2x/week   PT Duration 8 weeks    PT Treatment/Interventions ADLs/Self Care Home Management;Ultrasound;Cryotherapy;Electrical Stimulation;Iontophoresis 3m/ml Dexamethasone;Moist Heat;Traction;Balance training;Therapeutic exercise;Therapeutic activities;Functional mobility training;Stair training;Gait training;DME Instruction;Neuromuscular re-education;Patient/family education;Passive range of motion;Spinal Manipulations;Dry needling;Joint Manipulations;Taping;Manual techniques    PT Next Visit Plan return prn, dry needling prn    PT Home Exercise Plan [email or web address]   Consulted and Agree with Plan of Care Patient             Patient will benefit from skilled therapeutic intervention in order to improve the following deficits and impairments:  Decreased endurance, Hypomobility, Decreased activity tolerance, Decreased strength, Increased fascial restricitons, Impaired UE functional use,  Pain, Decreased mobility, Decreased range of motion, Impaired perceived functional ability, Improper body mechanics, Postural dysfunction, Impaired flexibility, Decreased coordination  Visit Diagnosis: Acute pain of left shoulder  Acute pain of right shoulder  Muscle weakness (generalized)  Abnormal posture     Problem List 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   Spinal stenosis at L4-L5 level 09/22/2016   Unstable angina ([place] 02/25/2016   NSTEMI (non-ST elevated myocardial infarction) ([place] 02/24/2016   Bradycardia    AV block    Thiamine deficiency 11/19/2015   Abnormal brain MRI 11/19/2015   Depression with somatization 11/19/2015   Acute pain of left wrist 10/23/2015   Primary osteoarthritis of first carpometacarpal joint of left hand 10/23/2015   Chronic renal insufficiency, stage II (mild) 07/02/2014   Coronary artery disease involving native coronary artery of native heart with angina pectoris ([place]    Apical variant hypertrophic cardiomyopathy ([place] 10/18/2013   Drug noncompliance 03/26/2013   Migraine, unspecified, without mention of intractable migraine without mention of status migrainosus 03/26/2013   GERD (gastroesophageal reflux disease) 11/14/2012   Routine general medical examination at a health care facility 07/28/2012   ERECTILE DYSFUNCTION 08/12/2010   Hyperlipidemia with target LDL less than 70 10/15/2009   TOBACCO USE 10/15/2009   Essential hypertension 10/15/2009   MScot English PT, DPT, OCS, ATC 11/18/20  12:09 PM  PHYSICAL THERAPY DISCHARGE SUMMARY  Visits from Start of Care: 4  Current functional level related to goals / functional outcomes: See note   Remaining deficits: See note  Education / Equipment: HEP   Patient agrees to discharge. Patient  goals were partially met. Patient is being discharged due to being pleased with the current functional level.  [name], PT, DPT, OCS, ATC 12/29/20  2:42 PM     [facility] [address] [place], [place], [address] Phone: [phone]   Fax:  [phone]  Name: [name] MRN: [id number] Date of Birth: November 01, 1960
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
## 2020-11-25 ENCOUNTER — Encounter: Payer: Self-pay | Admitting: Rehabilitative and Restorative Service Providers"

## 2020-11-25 ENCOUNTER — Other Ambulatory Visit: Payer: Self-pay

## 2020-12-02 ENCOUNTER — Encounter: Payer: Self-pay | Admitting: Rehabilitative and Restorative Service Providers"

## 2020-12-11 ENCOUNTER — Ambulatory Visit: Payer: Self-pay | Attending: Critical Care Medicine | Admitting: Critical Care Medicine

## 2020-12-11 ENCOUNTER — Other Ambulatory Visit: Payer: Self-pay

## 2020-12-11 ENCOUNTER — Encounter: Payer: Self-pay | Admitting: Critical Care Medicine

## 2020-12-11 ENCOUNTER — Telehealth: Payer: Self-pay | Admitting: Critical Care Medicine

## 2020-12-11 VITALS — BP 134/78 | HR 67 | Ht 68.0 in | Wt 174.0 lb

## 2020-12-11 DIAGNOSIS — I422 Other hypertrophic cardiomyopathy: Secondary | ICD-10-CM

## 2020-12-11 DIAGNOSIS — I25119 Atherosclerotic heart disease of native coronary artery with unspecified angina pectoris: Secondary | ICD-10-CM

## 2020-12-11 DIAGNOSIS — E785 Hyperlipidemia, unspecified: Secondary | ICD-10-CM

## 2020-12-11 DIAGNOSIS — Z23 Encounter for immunization: Secondary | ICD-10-CM

## 2020-12-11 DIAGNOSIS — I10 Essential (primary) hypertension: Secondary | ICD-10-CM

## 2020-12-11 DIAGNOSIS — F17200 Nicotine dependence, unspecified, uncomplicated: Secondary | ICD-10-CM

## 2020-12-11 MED ORDER — CLOPIDOGREL BISULFATE 75 MG PO TABS
ORAL_TABLET | Freq: Every day | ORAL | 3 refills | Status: DC
  Filled 2020-12-11: qty 30, 30d supply, fill #0
  Filled 2021-01-29: qty 30, 30d supply, fill #1
  Filled 2021-03-05: qty 30, 30d supply, fill #2
  Filled 2021-04-14: qty 30, 30d supply, fill #3
  Filled 2021-05-28: qty 30, 30d supply, fill #4
  Filled 2021-07-06: qty 30, 30d supply, fill #5
  Filled 2021-07-07: qty 30, 30d supply, fill #0

## 2020-12-11 MED ORDER — BACLOFEN 10 MG PO TABS
10.0000 mg | ORAL_TABLET | Freq: Three times a day (TID) | ORAL | 3 refills | Status: DC | PRN
  Filled 2020-12-11: qty 30, 10d supply, fill #0

## 2020-12-11 MED ORDER — LOSARTAN POTASSIUM-HCTZ 100-25 MG PO TABS
1.0000 | ORAL_TABLET | Freq: Every day | ORAL | 1 refills | Status: DC
  Filled 2020-12-11: qty 30, 30d supply, fill #0
  Filled 2021-01-29: qty 30, 30d supply, fill #1
  Filled 2021-03-05: qty 30, 30d supply, fill #2
  Filled 2021-04-14: qty 30, 30d supply, fill #3
  Filled 2021-05-28: qty 30, 30d supply, fill #4
  Filled 2021-07-06: qty 30, 30d supply, fill #5
  Filled 2021-07-07: qty 30, 30d supply, fill #0

## 2020-12-11 MED ORDER — RANOLAZINE ER 500 MG PO TB12
ORAL_TABLET | Freq: Two times a day (BID) | ORAL | 2 refills | Status: DC
Start: 2020-12-11 — End: 2021-05-28
  Filled 2020-12-11: qty 60, 30d supply, fill #0
  Filled 2021-01-29: qty 60, 30d supply, fill #1
  Filled 2021-04-14: qty 60, 30d supply, fill #2

## 2020-12-11 MED ORDER — ROSUVASTATIN CALCIUM 40 MG PO TABS
ORAL_TABLET | Freq: Every day | ORAL | 0 refills | Status: DC
  Filled 2020-12-11: qty 30, 30d supply, fill #0
  Filled 2021-01-29: qty 30, 30d supply, fill #1
  Filled 2021-03-05: qty 30, 30d supply, fill #2

## 2020-12-11 MED ORDER — NICOTINE POLACRILEX 4 MG MT LOZG
LOZENGE | OROMUCOSAL | 4 refills | Status: DC
  Filled 2020-12-11: qty 72, 24d supply, fill #0

## 2020-12-11 MED ORDER — NITROGLYCERIN 0.4 MG SL SUBL
0.4000 mg | SUBLINGUAL_TABLET | SUBLINGUAL | 3 refills | Status: DC | PRN
  Filled 2020-12-11: qty 25, 15d supply, fill #0

## 2020-12-11 NOTE — Assessment & Plan Note (Signed)
Ranexa managed by cardiology. Talked with patient about the importance of avoiding fast food and replacing with healthy fruits, vegetables with lean proteins in addition to light aerobic exercise 3-4 times per week.

## 2020-12-11 NOTE — Assessment & Plan Note (Signed)
Lipid panel ordered today. Crestor refilled. Patient is at goal with cholesterol numbers. Discussed the importance of dietary modifications to avoid processed foods, saturated fats and excess sugars.

## 2020-12-11 NOTE — Progress Notes (Signed)
New Patient Office Visit  Subjective:  Patient ID: [name], male    DOB: 04-Jan-1961  Age: 60 y.o. MRN: [id number]  CC:  Chief Complaint  Patient presents with   Establish Care    HPI [name] presents to reestablish PCP relationship. He has a past medical history of CAD, Stroke, HCM, HLD, and HTN. He seldom checks his blood pressure at home and forgot to bring his log with him. He says it is often above 140/90 if he is in a rush and forgets to take his medication. 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 He regularly eats fast food on a daily basis, often twice a day, and does not exercise outside of his job. He and his wife both smoke 1/2 PPD cigarettes and he says his triggers are working with the public, smoking after meals and as a coping mechanism for financial stressors with his family members.   In the past, he has tried substituting cigarettes with toothpicks or non-nicotine gum and was unsuccessful. During the summer months while at work, if he is exerting himself he will experience shortness of breath and chest pains that resolve after 5 minutes of sitting and resting. He has never used his nitro (currently expired) and states this is only a problem in the heat. He denies syncope. He has a history of multiple cardiac catheterizations due to hypertrophic cardiomyopathy and is established with cardiology.    He is also interested in a referral to a dentist. He has multiple cavities affecting his molars and has a history of dental extractions. He does not brush his teeth regularly and drinks multiple cups of coffee per day.    Past Medical History:  Diagnosis Date   Apical variant hypertrophic cardiomyopathy (HCC)    CAD (coronary artery disease)    a. LHC in 2011 with severe dz in D1/D2 and very distal LAD--> too small for PCI and managed medically.  b.  LHC in 10/2013 after an abnormal nuclear study w/ non-obst dz/c. 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   HOH (hard of hearing)    rgiht ear   Hyperlipidemia    Hypertension    Migraine headache    Stroke [facility](HCC)     Past Surgical History:  Procedure Laterality Date   CARDIAC CATHETERIZATION N/A 02/24/2016   Procedure: Left Heart Cath and Coronary Angiography;  Surgeon: [name], MD;  [facility];  Service: Cardiovascular;  Laterality: N/A;   CARDIAC CATHETERIZATION N/A 02/24/2016   Procedure: Coronary Stent Intervention;  Surgeon: [name], MD;  [facility];  Service: Cardiovascular;  Laterality: N/A;   CORONARY ANGIOPLASTY WITH STENT PLACEMENT  2017   CORONARY BALLOON ANGIOPLASTY N/A 04/30/2019   Procedure: CORONARY BALLOON ANGIOPLASTY;  Surgeon: SwazilandJordan, Peter M, MD;  [facility];  Service: Cardiovascular;  Laterality: N/A;   INGUINAL HERNIA REPAIR     LEFT HEART CATH AND CORONARY ANGIOGRAPHY N/A 04/30/2019   Procedure: LEFT HEART CATH AND CORONARY ANGIOGRAPHY;  Surgeon: Dolores PattyBensimhon, Daniel R, MD;  [facility];  Service: Cardiovascular;  Laterality: N/A;   LEFT HEART CATH AND CORONARY ANGIOGRAPHY N/A 05/14/2019   Procedure: LEFT HEART CATH AND CORONARY ANGIOGRAPHY;  Surgeon: Swaziland, Peter M, MD;  Location: [facility] INVASIVE CV LAB;  Service: Cardiovascular;  Laterality: N/A;   LEFT HEART CATHETERIZATION WITH CORONARY ANGIOGRAM N/A 11/22/2013   Procedure: LEFT HEART CATHETERIZATION WITH CORONARY ANGIOGRAM;  Surgeon: [name], MD;  Location: [facility] CATH LAB;  Service: Cardiovascular;  Laterality: N/A;   TEE WITHOUT CARDIOVERSION N/A 08/22/2015   Procedure: TRANSESOPHAGEAL  ECHOCARDIOGRAM (TEE);  Surgeon: Vesta Mixer, MD;  Location: [facility] ENDOSCOPY;  Service: Cardiovascular;  Laterality: N/A;    Family History  Problem Relation Age of Onset   Hypertension Mother    Heart failure Mother    Hypertension Father    Colon cancer Father 35   Colon cancer Paternal Uncle     Social History   Socioeconomic History   Marital status: Married    Spouse name: Not on file   Number of children: Not on file   Years of education: Not on file   Highest education level: Not on file  Occupational History   Not on file  Tobacco Use   Smoking status: Some Days    Packs/day: 0.25    Types: Cigarettes   Smokeless tobacco: Never  Vaping Use   Vaping Use: Never used  Substance and Sexual Activity   Alcohol use: No    Alcohol/week: 0.0 standard drinks   Drug use: No   Sexual activity: Not Currently  Other Topics Concern   Not on file  Social History Narrative   Not on file   Social Determinants of Health   Financial Resource Strain: Not on file  Food Insecurity: Not on file  Transportation Needs: Not on file  Physical Activity: Not on file  Stress: Not on file  Social Connections: Not on file  Intimate Partner Violence: Not on file    ROS Review of Systems  Constitutional:  Negative for activity change, appetite change, fatigue, fever and unexpected weight change.  HENT:  Positive for dental problem. Negative for congestion, postnasal drip, sneezing, sore throat and trouble swallowing.   Eyes:  Negative for pain and visual disturbance.  Respiratory:  Positive for shortness of breath (with exertion while at work). Negative for cough and choking.   Cardiovascular:  Positive for chest pain (with exertion while at work). Negative for palpitations and leg swelling.  Gastrointestinal:  Negative for abdominal pain, constipation, diarrhea, nausea and vomiting.  Genitourinary:  Negative for difficulty urinating.  Musculoskeletal:  Negative for arthralgias, back  pain, gait problem and myalgias.  Skin:  Negative for rash and wound.  Neurological:  Negative for dizziness, syncope, weakness, light-headedness and numbness.  Psychiatric/Behavioral:  Negative for agitation, confusion, hallucinations and sleep disturbance.    Objective:   Today's Vitals: BP (!) 143/80   Pulse 67   Ht 5\' 8"  (1.727 m)   Wt 174 lb (78.9 kg)   SpO2 99%   BMI 26.46 kg/m   Physical Exam Constitutional:      General: He is not in acute distress.    Appearance: Normal appearance. He is normal weight.  HENT:     Head: Normocephalic and atraumatic.     Right Ear: Tympanic membrane and ear canal normal.     Left Ear: Tympanic membrane and ear canal normal.     Nose: Nose normal.     Mouth/Throat:  Mouth: Mucous membranes are moist.     Pharynx: Oropharynx is clear.     Comments: Multiple dental caries noted. Some molars have been extracted, one molar left upper jaw was fractured and carious Eyes:     Pupils: Pupils are equal, round, and reactive to light.  Cardiovascular:     Rate and Rhythm: Normal rate and regular rhythm.     Pulses: Normal pulses.     Heart sounds: Normal heart sounds.  Pulmonary:     Effort: Pulmonary effort is normal.     Breath sounds: Normal breath sounds.  Abdominal:     General: Abdomen is flat. Bowel sounds are normal.     Palpations: Abdomen is soft.  Musculoskeletal:     Cervical back: Neck supple.  Skin:    General: Skin is warm and dry.  Neurological:     General: No focal deficit present.     Mental Status: He is alert.  Psychiatric:        Mood and Affect: Mood normal.        Behavior: Behavior normal.    Assessment & Plan:   Problem List Items Addressed This Visit       Cardiovascular and Mediastinum   Essential hypertension (Chronic)    Long discussion of dietary management and tobacco cessation. Repeat BP was 134/78 manually. Refill losartan-HCTZ and patient will follow up in 4 weeks for recheck. BMP drawn today  to eval electrolytes and kidney function.        Relevant Medications   nitroGLYCERIN (NITROSTAT) 0.4 MG SL tablet   ranolazine (RANEXA) 500 MG 12 hr tablet   rosuvastatin (CRESTOR) 40 MG tablet   losartan-hydrochlorothiazide (HYZAAR) 100-25 MG tablet   Other Relevant Orders   Comprehensive metabolic panel   CBC with Differential/Platelet   Apical variant hypertrophic cardiomyopathy (HCC) (Chronic)    Ranexa managed by cardiology. Talked with patient about the importance of avoiding fast food and replacing with healthy fruits, vegetables with lean proteins in addition to light aerobic exercise 3-4 times per week.        Relevant Medications   nitroGLYCERIN (NITROSTAT) 0.4 MG SL tablet   ranolazine (RANEXA) 500 MG 12 hr tablet   rosuvastatin (CRESTOR) 40 MG tablet   losartan-hydrochlorothiazide (HYZAAR) 100-25 MG tablet   Coronary artery disease involving native coronary artery of native heart with angina pectoris (HCC)    Stable. Nitroglycerin refilled. Also managed by cardiology. Recommended to add light aerobic activity.        Relevant Medications   nitroGLYCERIN (NITROSTAT) 0.4 MG SL tablet   ranolazine (RANEXA) 500 MG 12 hr tablet   rosuvastatin (CRESTOR) 40 MG tablet   losartan-hydrochlorothiazide (HYZAAR) 100-25 MG tablet   clopidogrel (PLAVIX) 75 MG tablet   Other Relevant Orders   CBC with Differential/Platelet     Other   Hyperlipidemia with target LDL less than 70 (Chronic)    Lipid panel ordered today. Crestor refilled. Patient is at goal with cholesterol numbers. Discussed the importance of dietary modifications to avoid processed foods, saturated fats and excess sugars.        Relevant Medications   nitroGLYCERIN (NITROSTAT) 0.4 MG SL tablet   ranolazine (RANEXA) 500 MG 12 hr tablet   rosuvastatin (CRESTOR) 40 MG tablet   losartan-hydrochlorothiazide (HYZAAR) 100-25 MG tablet   TOBACCO USE (Chronic)    Discussed with patient the physiology of nicotine on  the body and the importance of cessation. Wellbutrin and chantix are not indicated for the patient  at this time. Recommended nicotine lozenges as well as a referral for a consultation with case management for behavioral therapy and stress management.      Current smoking consumption amount: 1/2 PPD  Dicsussion on advise to quit smoking and smoking impacts: CV impacts/CAD  Patient's willingness to quit:  yes  Methods to quit smoking discussed:  Behavioral modification and nicotine replacement  Medication management of smoking session drugs discussed:not a good chantix or buproprion candidate, recommend 4mg  lozenge nicotine tid  Resources provided:  AVS   Setting quit date  Not established  Follow-up arranged   2 months Dr ref to LCSW for behav Rx for nicotine dependency    Time spent counseling the patient:  5 min         Other Visit Diagnoses     Hyperlipidemia, unspecified hyperlipidemia type       Relevant Medications   nitroGLYCERIN (NITROSTAT) 0.4 MG SL tablet   ranolazine (RANEXA) 500 MG 12 hr tablet   rosuvastatin (CRESTOR) 40 MG tablet   losartan-hydrochlorothiazide (HYZAAR) 100-25 MG tablet   Other Relevant Orders   Lipid panel       Outpatient Encounter Medications as of 12/11/2020  Medication Sig   acetaminophen (TYLENOL) 500 MG tablet Take 2 tablets (1,000 mg total) by mouth every 6 (six) hours as needed.   aspirin 81 MG chewable tablet Chew 1 tablet (81 mg total) by mouth daily.   nicotine polacrilex (NICORETTE MINI) 4 MG lozenge Use 1-3 daily to quit smoking   thiamine ([employer] B-1) 100 MG tablet Take 1 tablet (100 mg total) by mouth every other day.   [DISCONTINUED] baclofen (LIORESAL) 10 MG tablet Take 0.5-1 tablets (5-10 mg total) by mouth 3 (three) times daily as needed for muscle spasms.   [DISCONTINUED] clopidogrel (PLAVIX) 75 MG tablet TAKE 1 TABLET (75 MG TOTAL) BY MOUTH DAILY.   [DISCONTINUED] losartan-hydrochlorothiazide (HYZAAR) 100-25 MG  tablet TAKE 1 TABLET BY MOUTH DAILY.   [DISCONTINUED] methocarbamol (ROBAXIN) 500 MG tablet Take 1 tablet (500 mg total) by mouth every 6 (six) hours as needed for muscle spasms.   [DISCONTINUED] ranolazine (RANEXA) 500 MG 12 hr tablet TAKE 1 TABLET (500 MG TOTAL) BY MOUTH 2 (TWO) TIMES DAILY.   [DISCONTINUED] rosuvastatin (CRESTOR) 40 MG tablet TAKE 1 TABLET (40 MG TOTAL) BY MOUTH DAILY.   [DISCONTINUED] triamcinolone cream (KENALOG) 0.1 % Apply 1 application topically 2 (two) times daily. To affected skin areas x 10 days then as needed   baclofen (LIORESAL) 10 MG tablet Take 1 tablet (10 mg total) by mouth 3 (three) times daily as needed for muscle spasms.   clopidogrel (PLAVIX) 75 MG tablet TAKE 1 TABLET (75 MG TOTAL) BY MOUTH DAILY.   losartan-hydrochlorothiazide (HYZAAR) 100-25 MG tablet TAKE 1 TABLET BY MOUTH DAILY.   nitroGLYCERIN (NITROSTAT) 0.4 MG SL tablet Place 1 tablet (0.4 mg total) under the tongue every 5 (five) minutes as needed for chest pain.   ranolazine (RANEXA) 500 MG 12 hr tablet TAKE 1 TABLET (500 MG TOTAL) BY MOUTH 2 (TWO) TIMES DAILY.   rosuvastatin (CRESTOR) 40 MG tablet TAKE 1 TABLET (40 MG TOTAL) BY MOUTH DAILY.   [DISCONTINUED] nitroGLYCERIN (NITROSTAT) 0.4 MG SL tablet Place 1 tablet (0.4 mg total) under the tongue every 5 (five) minutes as needed for chest pain.   No facility-administered encounter medications on file as of 12/11/2020.  PCV 13 given at this visit, pt will return for zoster second dose later date 45 min spent in  chart review, history/ physical,  patient education, complex medical decision making Follow-up: Return in about 2 months (around 02/11/2021).   Shan Levans, MD

## 2020-12-11 NOTE — Assessment & Plan Note (Addendum)
Discussed with patient the physiology of nicotine on the body and the importance of cessation. Wellbutrin and chantix are not indicated for the patient at this time. Recommended nicotine lozenges as well as a referral for a consultation with case management for behavioral therapy and stress management.      . Current smoking consumption amount: 1/2 PPD  . Dicsussion on advise to quit smoking and smoking impacts: CV impacts/CAD  . Patient's willingness to quit:  yes  . Methods to quit smoking discussed:  Behavioral modification and nicotine replacement  . Medication management of smoking session drugs discussed:not a good chantix or buproprion candidate, recommend 4mg  lozenge nicotine tid  . Resources provided:  AVS   . Setting quit date  Not established  . Follow-up arranged   2 months Dr ref to LCSW for behav Rx for nicotine dependency    Time spent counseling the patient:  5 min

## 2020-12-11 NOTE — Telephone Encounter (Signed)
THis very nice pt has a nicotine dependency   can you see him for stress counseling..  has mild anxiety not needing meds.  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

## 2020-12-11 NOTE — Assessment & Plan Note (Signed)
Long discussion of dietary management and tobacco cessation. Repeat BP was 134/78 manually. Refill losartan-HCTZ and patient will follow up in 4 weeks for recheck. BMP drawn today to eval electrolytes and kidney function.

## 2020-12-11 NOTE — Patient Instructions (Signed)
Refills on all your medications sent to our pharmacy  Follow back exercises as outlined on the attachment  Recommend urgent tooth to have some of your teeth removed and further dental care we gave you contact information Take the form we gave you to urgent tooth as well  Asante our clinical social worker will contact you for counseling regarding your nicotine dependency and begin nicotine lozenges 1-3 times daily as prescribed  A pneumonia vaccine Prevnar 13 was given  An appointment for a nurse visit in the next 2 to 4 weeks will be made for the second shingles vaccine  Follow a healthy diet as we discussed see attachment  Begin an exercise program as we discussed I recommend you join the [facility]  Return to see Dr. Delford Field in 2 months

## 2020-12-11 NOTE — Assessment & Plan Note (Addendum)
Stable. Nitroglycerin refilled. Also managed by cardiology. Recommended to add light aerobic activity.

## 2020-12-12 ENCOUNTER — Encounter: Payer: Self-pay | Admitting: *Deleted

## 2020-12-12 LAB — CBC WITH DIFFERENTIAL/PLATELET
Basophils Absolute: 0 10*3/uL (ref 0.0–0.2)
Basos: 1 %
EOS (ABSOLUTE): 0.3 10*3/uL (ref 0.0–0.4)
Eos: 5 %
Hematocrit: 39.1 % (ref 37.5–51.0)
Hemoglobin: 12.7 g/dL — ABNORMAL LOW (ref 13.0–17.7)
Immature Grans (Abs): 0 10*3/uL (ref 0.0–0.1)
Immature Granulocytes: 0 %
Lymphocytes Absolute: 1.6 10*3/uL (ref 0.7–3.1)
Lymphs: 31 %
MCH: 30.1 pg (ref 26.6–33.0)
MCHC: 32.5 g/dL (ref 31.5–35.7)
MCV: 93 fL (ref 79–97)
Monocytes Absolute: 0.4 10*3/uL (ref 0.1–0.9)
Monocytes: 8 %
Neutrophils Absolute: 2.8 10*3/uL (ref 1.4–7.0)
Neutrophils: 55 %
Platelets: 286 10*3/uL (ref 150–450)
RBC: 4.22 x10E6/uL (ref 4.14–5.80)
RDW: 11.4 % — ABNORMAL LOW (ref 11.6–15.4)
WBC: 5.1 10*3/uL (ref 3.4–10.8)

## 2020-12-12 LAB — COMPREHENSIVE METABOLIC PANEL
ALT: 10 IU/L (ref 0–44)
AST: 17 IU/L (ref 0–40)
Albumin/Globulin Ratio: 1.8 (ref 1.2–2.2)
Albumin: 4.6 g/dL (ref 3.8–4.9)
Alkaline Phosphatase: 81 IU/L (ref 44–121)
BUN/Creatinine Ratio: 11 (ref 9–20)
BUN: 14 mg/dL (ref 6–24)
Bilirubin Total: 0.6 mg/dL (ref 0.0–1.2)
CO2: 25 mmol/L (ref 20–29)
Calcium: 9.4 mg/dL (ref 8.7–10.2)
Chloride: 99 mmol/L (ref 96–106)
Creatinine, Ser: 1.3 mg/dL — ABNORMAL HIGH (ref 0.76–1.27)
Globulin, Total: 2.5 g/dL (ref 1.5–4.5)
Glucose: 70 mg/dL (ref 65–99)
Potassium: 3.9 mmol/L (ref 3.5–5.2)
Sodium: 143 mmol/L (ref 134–144)
Total Protein: 7.1 g/dL (ref 6.0–8.5)
eGFR: 63 mL/min/{1.73_m2} (ref >59)

## 2020-12-12 LAB — LIPID PANEL
Chol/HDL Ratio: 2 ratio (ref 0.0–5.0)
Cholesterol, Total: 95 mg/dL — ABNORMAL LOW (ref 100–199)
HDL: 48 mg/dL (ref >39)
LDL Chol Calc (NIH): 36 mg/dL (ref 0–99)
Triglycerides: 41 mg/dL (ref 0–149)
VLDL Cholesterol Cal: 11 mg/dL (ref 5–40)

## 2020-12-12 NOTE — Telephone Encounter (Signed)
Spoke with pt and scheduled appt for 12/15/20 at 2:30

## 2020-12-15 ENCOUNTER — Other Ambulatory Visit: Payer: Self-pay

## 2020-12-15 ENCOUNTER — Ambulatory Visit: Payer: Self-pay | Attending: Critical Care Medicine | Admitting: Clinical

## 2020-12-15 DIAGNOSIS — F439 Reaction to severe stress, unspecified: Secondary | ICD-10-CM

## 2020-12-15 DIAGNOSIS — F17210 Nicotine dependence, cigarettes, uncomplicated: Secondary | ICD-10-CM

## 2020-12-15 DIAGNOSIS — R440 Auditory hallucinations: Secondary | ICD-10-CM

## 2020-12-15 DIAGNOSIS — F411 Generalized anxiety disorder: Secondary | ICD-10-CM

## 2020-12-15 DIAGNOSIS — F17200 Nicotine dependence, unspecified, uncomplicated: Secondary | ICD-10-CM

## 2020-12-16 ENCOUNTER — Telehealth (INDEPENDENT_AMBULATORY_CARE_PROVIDER_SITE_OTHER): Payer: Self-pay

## 2020-12-16 DIAGNOSIS — F411 Generalized anxiety disorder: Secondary | ICD-10-CM | POA: Insufficient documentation

## 2020-12-16 DIAGNOSIS — R440 Auditory hallucinations: Secondary | ICD-10-CM | POA: Insufficient documentation

## 2020-12-16 NOTE — Telephone Encounter (Signed)
Reached out to contact the patient about the smoking cessation classes. LVM for patient to give a me a phone call back @ [facility].

## 2020-12-16 NOTE — BH Specialist Note (Signed)
Integrated Behavioral Health Initial In-Person Visit  MRN: [id number] Name: [name]  Number of Integrated Behavioral Health Clinician visits:: 1/6 Session Start time: 2:42 PM  Session End time: 3:42 PM Total time: 60 minutes  Types of Service: Individual psychotherapy and Health Promotion  Interpretor:No. Interpretor Name and Language: N/A   Warm Hand Off Completed.         Subjective: [name] is a 60 y.o. male accompanied by  self Patient was referred by PCP Delford Field for tobacco use. Patient reports the following symptoms/concerns: Pt reports feeling stress, excessive worrying, anxiousness, irritability, feeling something awful will happen, restlessness, racing thoughts, and difficulty concentrating. Reports tobacco use.  Duration of problem: 30+ years; Severity of problem: severe  Objective: Mood: Anxious and Affect: Appropriate Risk of harm to self or others: No plan to harm self or others  Life Context: Family and Social: Reports that he currently lives with partner. Reports that his family is reliant on him financially to care for them. Reports that provides money to his children to care for his grandchildren. Reports that he also helps his siblings pay for their bills, although they have there own income. 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  Self-Care: Reports that he smokes 1 pack of cigarettes/day. Reports that he has been smoking since age 39. Acknowledges tobacco use as a coping skill. Reports that he also gardens.  Life Changes: Reports that he has experienced several heart attacks and strokes. Reports that he is frequently stressed due to financially caring for family. Reports that he also worries excessively about family.   Patient and/or Family's Strengths/Protective Factors: Social connections, Social and Patent attorney, Concrete supports in place (healthy food, safe environments, etc.), Sense of purpose, and  Physical Health (exercise, healthy diet, medication compliance, etc.)  Goals Addressed: Patient will: Reduce symptoms of: anxiety and stress Increase knowledge and/or ability of: coping skills, healthy habits, and stress reduction  Demonstrate ability to: Increase healthy adjustment to current life circumstances, Decrease self-medicating behaviors, and Increase healthy boundaries  Progress towards Goals: Ongoing  Interventions: Interventions utilized: Mindfulness or Management consultant, Mining engineer, CBT Cognitive Behavioral Therapy, Psychoeducation and/or Health Education, Preventative Services/Health Promotion, and Stages of change   Standardized Assessments completed:  MDQ, GAD-7, and PHQ 9  Patient and/or Family Response:  Pt receptive to plan.   Patient Centered Plan: Patient is on the following Treatment Plan(s):  Pt will fu with LCSW and decrease tobacco use.   Assessment: Pt presents with anxious mood with an appropriate affect. Reports a hx SI in the past but denies current SI/HI. Pt provided with crisis resources. Pt reports experiencing auditory hallucinations every 1-2 months. Reports that he has not experienced any auditory hallucinations recently. Denies visual hallucinations. No safety risks. Pt reports that he wants to stop smoking cigarettes. Reports that he has been smoking since age 52. Acknowledges tobacco use as a coping skill for stress and anxiety. Reports 1 pack of cigarettes/day. Reports that he is often stress and anxious due to his family being constantly reliant on him for money. 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 Acknowledges having difficulty saying "no" due to beng non-confrontational.  Reports that he often wants to make others happy but it creates excess stress. Reports that his partner is frustrated with him constantly caring for others/ Patient currently experiencing  difficulty establishing healthy boundaries. Pt appears to  experience anxiety due to difficulty relaxing, restlessness, and excessive worrying. Pt worries about his families perception of him if he says no.    Patient may benefit from cognitive restructuring and assistance with establishing healthy boundaries. Pt may also benefit from medication management if anxiety and tobacco use continue. Pt had difficulty identifying healthy coping skills aside from gardening to replace unhealthy coping skill (tobacco use). Pt plans to decrease tobacco use to half a pack/day. Pt agrees to utilize deep breathing exercises and fidget "pop it" toy. Psychoeducation on tobacco use and anxiety provided. Pt identifies motivation for change as physical health. LCSW will fu with pt.  Plan: Follow up with behavioral health clinician on : 12/31/20 Behavioral recommendations: Utilize deep breathing exercises, utilize [employer] "pop it", begin establishing healthy boundaries, identify healthy coping skills  Referral(s): Integrated Art gallery manager (In Clinic) and [employer]:  smoking cessation program "From scale of 1-10, how likely are you to follow plan?": 10  Brenson Hartman C Vasili Fok, LCSW

## 2020-12-17 ENCOUNTER — Other Ambulatory Visit: Payer: Self-pay

## 2020-12-17 NOTE — Progress Notes (Signed)
Reached out to the patient yesterday by phone. LVM for the patient to call back with information. Mailed out information on smoking cessation classes as well.

## 2020-12-25 ENCOUNTER — Other Ambulatory Visit: Payer: Self-pay

## 2020-12-25 ENCOUNTER — Ambulatory Visit: Payer: Self-pay | Attending: Critical Care Medicine

## 2020-12-25 DIAGNOSIS — Z23 Encounter for immunization: Secondary | ICD-10-CM

## 2020-12-25 NOTE — Progress Notes (Signed)
Pt arrived at clinic to get 1st dose of shingles vaccine.  Pt to return in 2 months.

## 2020-12-31 ENCOUNTER — Ambulatory Visit: Payer: Self-pay | Admitting: Clinical

## 2021-01-02 ENCOUNTER — Other Ambulatory Visit: Payer: Self-pay

## 2021-01-07 ENCOUNTER — Telehealth: Payer: Self-pay | Admitting: Critical Care Medicine

## 2021-01-07 NOTE — Telephone Encounter (Signed)
Copied from CRM [id number]. Topic: General - Other >> Dec 31, 2020  8:14 AM Jaquita Rector A wrote: Reason for CRM: Patient wife called in to inform that he will not be able to attend his appointment this afternoon at 2.30 PM asking for it to be cancelled and call back to reschedule Ph# [phone]   Patient has missed an appointment with you on Aug 3 and would like to reschedule

## 2021-01-09 NOTE — Telephone Encounter (Signed)
Scheduled appt with pt for 01/28/21 at 4:00pm.

## 2021-01-28 ENCOUNTER — Ambulatory Visit: Payer: Self-pay | Admitting: Clinical

## 2021-01-30 ENCOUNTER — Other Ambulatory Visit: Payer: Self-pay

## 2021-02-03 ENCOUNTER — Other Ambulatory Visit: Payer: Self-pay

## 2021-02-11 ENCOUNTER — Other Ambulatory Visit: Payer: Self-pay

## 2021-02-11 ENCOUNTER — Encounter: Payer: Self-pay | Admitting: Critical Care Medicine

## 2021-02-11 ENCOUNTER — Ambulatory Visit: Payer: Self-pay | Attending: Critical Care Medicine | Admitting: Critical Care Medicine

## 2021-02-11 DIAGNOSIS — I10 Essential (primary) hypertension: Secondary | ICD-10-CM

## 2021-02-11 DIAGNOSIS — E785 Hyperlipidemia, unspecified: Secondary | ICD-10-CM

## 2021-02-11 DIAGNOSIS — R972 Elevated prostate specific antigen [PSA]: Secondary | ICD-10-CM

## 2021-02-11 DIAGNOSIS — F17200 Nicotine dependence, unspecified, uncomplicated: Secondary | ICD-10-CM

## 2021-02-11 DIAGNOSIS — I25119 Atherosclerotic heart disease of native coronary artery with unspecified angina pectoris: Secondary | ICD-10-CM

## 2021-02-11 DIAGNOSIS — F17210 Nicotine dependence, cigarettes, uncomplicated: Secondary | ICD-10-CM

## 2021-02-11 NOTE — Patient Instructions (Signed)
Another appointment with Asante will be made for smoking cessation counseling  No change in medications  We will send a report to your dentist, we do not normally stop your blood thinners before this type of procedure but if the dentist feels it will be a complicated extraction then we will need to stop both aspirin and the Plavix 5 days before the procedure then resume immediately after if the blood is clotting well  You declined your flu vaccine at this visit  Return to see Dr. Delford Field 4 months

## 2021-02-11 NOTE — Assessment & Plan Note (Signed)
Blood pressure currently at goal continue current medications without change  I suspect blood pressure will come down further once he quit smoking entirely

## 2021-02-11 NOTE — Progress Notes (Signed)
Established Patient Office Visit  Subjective:  Patient ID: [name], male    DOB: 03/01/1961  Age: 60 y.o. MRN: [id number]  CC:  Chief Complaint  Patient presents with   Hypertension    HPI [name] presents for follow-up of hypertension and preop clearance for dental extraction.  Patient has a left lower jaw molar that needs to be removed per dentistry.  Note on arrival blood pressure is 140/90.  Patient is still smoking 8 to 9 cigarettes daily.  Patient's been maintaining all medications does maintain aspirin and Plavix.  Patient did decline a flu shot on arrival.  Patient has no other specific complaints at this time.  Patient has been seeing our clinical social work for counseling on smoking cessation missed his last appointment and needs to make another appointment    Past Medical History:  Diagnosis Date   Apical variant hypertrophic cardiomyopathy (HCC)    CAD (coronary artery disease)    a. LHC in 2011 with severe dz in D1/D2 and very distal LAD--> too small for PCI and managed medically.  b.  LHC in 10/2013 after an abnormal nuclear study w/ non-obst dz/c. 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   HOH (hard of hearing)    rgiht ear   Hyperlipidemia    Hypertension    Migraine headache    Stroke [facility])     Past Surgical History:  Procedure Laterality Date   CARDIAC CATHETERIZATION N/A 02/24/2016   Procedure: Left Heart Cath and Coronary Angiography;  Surgeon: Tonny Bollman, MD;  Location: [facility] INVASIVE CV LAB;  Service: Cardiovascular;  Laterality: N/A;   CARDIAC CATHETERIZATION N/A 02/24/2016   Procedure: Coronary Stent  Intervention;  Surgeon: Tonny Bollman, MD;  Location: [facility] INVASIVE CV LAB;  Service: Cardiovascular;  Laterality: N/A;   CORONARY ANGIOPLASTY WITH STENT PLACEMENT  2017   CORONARY BALLOON ANGIOPLASTY N/A 04/30/2019   Procedure: CORONARY BALLOON ANGIOPLASTY;  Surgeon: Swaziland, Peter M, MD;  Location: [facility] INVASIVE CV LAB;  Service: Cardiovascular;  Laterality: N/A;   INGUINAL HERNIA REPAIR     LEFT HEART CATH AND CORONARY ANGIOGRAPHY N/A 04/30/2019   Procedure: LEFT HEART CATH AND CORONARY ANGIOGRAPHY;  Surgeon: Dolores Patty, MD;  Location: [facility] INVASIVE CV LAB;  Service: Cardiovascular;  Laterality: N/A;   LEFT HEART CATH AND CORONARY ANGIOGRAPHY N/A 05/14/2019   Procedure: LEFT HEART CATH AND CORONARY ANGIOGRAPHY;  Surgeon: Swaziland, Peter M, MD;  Location: [facility] INVASIVE CV LAB;  Service: Cardiovascular;  Laterality: N/A;   LEFT HEART CATHETERIZATION WITH CORONARY ANGIOGRAM N/A 11/22/2013   Procedure: LEFT HEART CATHETERIZATION WITH CORONARY ANGIOGRAM;  Surgeon: [name], MD;  Location: [facility] CATH LAB;  Service: Cardiovascular;  Laterality: N/A;   TEE WITHOUT CARDIOVERSION N/A 08/22/2015   Procedure: TRANSESOPHAGEAL ECHOCARDIOGRAM (TEE);  Surgeon: Vesta Mixer, MD;  Location: [facility] ENDOSCOPY;  Service: Cardiovascular;  Laterality: N/A;    Family History  Problem Relation Age of Onset   Hypertension Mother    Heart failure Mother    Hypertension Father    Colon cancer Father 59   Colon cancer Paternal Uncle  Social History   Socioeconomic History   Marital status: Married    Spouse name: Not on file   Number of children: Not on file   Years of education: Not on file   Highest education level: Not on file  Occupational History   Not on file  Tobacco Use   Smoking status: Some Days    Packs/day: 0.25    Types: Cigarettes   Smokeless tobacco: Never  Vaping Use   Vaping Use: Never used  Substance and Sexual Activity   Alcohol use: No    Alcohol/week: 0.0 standard drinks    Drug use: No   Sexual activity: Not Currently  Other Topics Concern   Not on file  Social History Narrative   Not on file   Social Determinants of Health   Financial Resource Strain: Not on file  Food Insecurity: Not on file  Transportation Needs: Not on file  Physical Activity: Not on file  Stress: Not on file  Social Connections: Not on file  Intimate Partner Violence: Not on file    Outpatient Medications Prior to Visit  Medication Sig Dispense Refill   acetaminophen (TYLENOL) 500 MG tablet Take 2 tablets (1,000 mg total) by mouth every 6 (six) hours as needed. 30 tablet 0   aspirin 81 MG chewable tablet Chew 1 tablet (81 mg total) by mouth daily.     baclofen (LIORESAL) 10 MG tablet Take 1 tablet (10 mg total) by mouth 3 (three) times daily as needed for muscle spasms. 30 each 3   clopidogrel (PLAVIX) 75 MG tablet TAKE 1 TABLET (75 MG TOTAL) BY MOUTH DAILY. 90 tablet 3   losartan-hydrochlorothiazide (HYZAAR) 100-25 MG tablet TAKE 1 TABLET BY MOUTH DAILY. 90 tablet 1   nicotine polacrilex (NICORETTE MINI) 4 MG lozenge Use 1-3 daily to quit smoking 100 tablet 4   nitroGLYCERIN (NITROSTAT) 0.4 MG SL tablet Place 1 tablet (0.4 mg total) under the tongue every 5 (five) minutes as needed for chest pain. 25 tablet 3   ranolazine (RANEXA) 500 MG 12 hr tablet TAKE 1 TABLET (500 MG TOTAL) BY MOUTH 2 (TWO) TIMES DAILY. 60 tablet 2   rosuvastatin (CRESTOR) 40 MG tablet TAKE 1 TABLET (40 MG TOTAL) BY MOUTH DAILY. 90 tablet 0   thiamine ([employer] B-1) 100 MG tablet Take 1 tablet (100 mg total) by mouth every other day. 45 tablet 1   traMADol (ULTRAM) 50 MG tablet Take 50 mg by mouth every 6 (six) hours.     ibuprofen (ADVIL) 800 MG tablet Take 800 mg by mouth every 6 (six) hours as needed.     No facility-administered medications prior to visit.    Allergies  Allergen Reactions   Lisinopril Swelling    Facial swelling    ROS Review of Systems  Constitutional:  Negative for chills,  diaphoresis and fever.  HENT:  Negative for congestion, ear discharge, ear pain, hearing loss, nosebleeds, sore throat and tinnitus.   Eyes:  Negative for photophobia and discharge.  Respiratory:  Negative for cough, shortness of breath, wheezing and stridor.        No excess mucus  Cardiovascular:  Negative for chest pain, palpitations and leg swelling.  Gastrointestinal:  Negative for abdominal pain, blood in stool, constipation, diarrhea, nausea and vomiting.  Endocrine: Negative for polydipsia.  Genitourinary:  Negative for dysuria, flank pain, frequency, hematuria and urgency.  Musculoskeletal:  Negative for back pain, myalgias and neck pain.  Skin:  Negative for rash.  Allergic/Immunologic: Negative  for environmental allergies.  Neurological:  Negative for dizziness, tremors, seizures, weakness and headaches.  Hematological:  Does not bruise/bleed easily.  Psychiatric/Behavioral: Negative.  Negative for hallucinations and suicidal ideas. The patient is not nervous/anxious.   All other systems reviewed and are negative.    Objective:    Physical Exam Vitals reviewed.  Constitutional:      Appearance: Normal appearance. He is well-developed. He is not diaphoretic.  HENT:     Head: Normocephalic and atraumatic.     Nose: No nasal deformity, septal deviation, mucosal edema or rhinorrhea.     Right Sinus: No maxillary sinus tenderness or frontal sinus tenderness.     Left Sinus: No maxillary sinus tenderness or frontal sinus tenderness.     Mouth/Throat:     Pharynx: No oropharyngeal exudate.     Comments: Cracked left lower molar on the left side with dental caries Eyes:     General: No scleral icterus.    Conjunctiva/sclera: Conjunctivae normal.     Pupils: Pupils are equal, round, and reactive to light.  Neck:     Thyroid: No thyromegaly.     Vascular: No carotid bruit or JVD.     Trachea: Trachea normal. No tracheal tenderness or tracheal deviation.  Cardiovascular:      Rate and Rhythm: Normal rate and regular rhythm.     Chest Wall: PMI is not displaced.     Pulses: Normal pulses. No decreased pulses.     Heart sounds: Normal heart sounds, S1 normal and S2 normal. Heart sounds not distant. No murmur heard. No systolic murmur is present.  No diastolic murmur is present.    No friction rub. No gallop. No S3 or S4 sounds.  Pulmonary:     Effort: No tachypnea, accessory muscle usage or respiratory distress.     Breath sounds: No stridor. No decreased breath sounds, wheezing, rhonchi or rales.  Chest:     Chest wall: No tenderness.  Abdominal:     General: Bowel sounds are normal. There is no distension.     Palpations: Abdomen is soft. Abdomen is not rigid.     Tenderness: There is no abdominal tenderness. There is no guarding or rebound.  Musculoskeletal:        General: Normal range of motion.     Cervical back: Normal range of motion and neck supple. No edema, erythema or rigidity. No muscular tenderness. Normal range of motion.  Lymphadenopathy:     Head:     Right side of head: No submental or submandibular adenopathy.     Left side of head: No submental or submandibular adenopathy.     Cervical: No cervical adenopathy.  Skin:    General: Skin is warm and dry.     Coloration: Skin is not pale.     Findings: No rash.     Nails: There is no clubbing.  Neurological:     Mental Status: He is alert and oriented to person, place, and time.     Sensory: No sensory deficit.  Psychiatric:        Speech: Speech normal.        Behavior: Behavior normal.    BP 140/90   Pulse (!) 5   Resp 16   Wt 171 lb (77.6 kg)   SpO2 98%   BMI 26.00 kg/m  Wt Readings from Last 3 Encounters:  02/11/21 171 lb (77.6 kg)  12/11/20 174 lb (78.9 kg)  10/13/20 175 lb (79.4 kg)  Health Maintenance Due  Topic Date Due   COVID-19 Vaccine (4 - Booster for Pfizer series) 07/16/2020   INFLUENZA VACCINE  Never done    There are no preventive care reminders to  display for this patient.  Lab Results  Component Value Date   TSH 1.44 10/18/2018   Lab Results  Component Value Date   WBC 5.1 12/11/2020   HGB 12.7 (L) 12/11/2020   HCT 39.1 12/11/2020   MCV 93 12/11/2020   PLT 286 12/11/2020   Lab Results  Component Value Date   NA 143 12/11/2020   K 3.9 12/11/2020   CO2 25 12/11/2020   GLUCOSE 70 12/11/2020   BUN 14 12/11/2020   CREATININE 1.30 (H) 12/11/2020   BILITOT 0.6 12/11/2020   ALKPHOS 81 12/11/2020   AST 17 12/11/2020   ALT 10 12/11/2020   PROT 7.1 12/11/2020   ALBUMIN 4.6 12/11/2020   CALCIUM 9.4 12/11/2020   ANIONGAP 9 05/15/2019   EGFR 63 12/11/2020   GFR 73.27 03/06/2019   Lab Results  Component Value Date   CHOL 95 (L) 12/11/2020   Lab Results  Component Value Date   HDL 48 12/11/2020   Lab Results  Component Value Date   LDLCALC 36 12/11/2020   Lab Results  Component Value Date   TRIG 41 12/11/2020   Lab Results  Component Value Date   CHOLHDL 2.0 12/11/2020   Lab Results  Component Value Date   HGBA1C 5.3 04/29/2019      Assessment & Plan:   Problem List Items Addressed This Visit       Cardiovascular and Mediastinum   Essential hypertension (Chronic)    Blood pressure currently at goal continue current medications without change  I suspect blood pressure will come down further once he quit smoking entirely      Coronary artery disease involving native coronary artery of native heart with angina pectoris ([place])    Patient has a chronic stent drug-eluting and has been on Plavix and aspirin on an ongoing basis.  Cardiology saw the patient in March and did not indicate stopping the Plavix therefore is being maintained  I believe it is safe for the patient to stop the Plavix and aspirin prior to upcoming dental procedure although typically is not recommended to stop DAPT therapy for dental procedures  I told the dentist if the molar extraction is complicated and bleeding risk I it is fine if  he stops these medications aspirin and Plavix 5 days prior to the procedure then resume immediately after it provided adequate hemostasis        Other   Hyperlipidemia with target LDL less than 70 (Chronic)     Patient is at goal with cholesterol numbers. Discussed the importance of dietary modifications to avoid processed foods, saturated fats and excess sugars.       TOBACCO USE (Chronic)    Discussed with patient the physiology of nicotine on the body and the importance of cessation. Wellbutrin and chantix are not indicated for the patient at this time. Recommended nicotine lozenges as well as a referral for a consultation with case management for behavioral therapy and stress management.      Current smoking consumption amount: 1/2 PPD  Dicsussion on advise to quit smoking and smoking impacts: CV impacts/CAD  Patient's willingness to quit:  yes  Methods to quit smoking discussed:  Behavioral modification and nicotine replacement  Medication management of smoking session drugs discussed:not a good chantix or buproprion candidate,  recommend '4mg'$  lozenge nicotine tid  Resources provided:  AVS   Setting quit date  Not established  Follow-up arranged   2 months Dr Joya Gaskins ref to LCSW for behav Rx for nicotine dependency    Time spent counseling the patient:  5 min         Rising PSA level    Monitor for now       No orders of the defined types were placed in this encounter.   Follow-up: Return in about 4 months (around 06/13/2021).    Asencion Noble, MD

## 2021-02-11 NOTE — Assessment & Plan Note (Signed)
Patient has a chronic stent drug-eluting and has been on Plavix and aspirin on an ongoing basis.  Cardiology saw the patient in March and did not indicate stopping the Plavix therefore is being maintained  I believe it is safe for the patient to stop the Plavix and aspirin prior to upcoming dental procedure although typically is not recommended to stop DAPT therapy for dental procedures  I told the dentist if the molar extraction is complicated and bleeding risk I it is fine if he stops these medications aspirin and Plavix 5 days prior to the procedure then resume immediately after it provided adequate hemostasis

## 2021-02-11 NOTE — Assessment & Plan Note (Signed)
Discussed with patient the physiology of nicotine on the body and the importance of cessation. Wellbutrin and chantix are not indicated for the patient at this time. Recommended nicotine lozenges as well as a referral for a consultation with case management for behavioral therapy and stress management.      . Current smoking consumption amount: 1/2 PPD  . Dicsussion on advise to quit smoking and smoking impacts: CV impacts/CAD  . Patient's willingness to quit:  yes  . Methods to quit smoking discussed:  Behavioral modification and nicotine replacement  . Medication management of smoking session drugs discussed:not a good chantix or buproprion candidate, recommend 4mg lozenge nicotine tid  . Resources provided:  AVS   . Setting quit date  Not established  . Follow-up arranged   2 months Dr Parnell Spieler/ ref to LCSW for behav Rx for nicotine dependency    Time spent counseling the patient:  5 min    

## 2021-02-11 NOTE — Assessment & Plan Note (Signed)
Monitor for now

## 2021-02-11 NOTE — Assessment & Plan Note (Signed)
Patient is at goal with cholesterol numbers. Discussed the importance of dietary modifications to avoid processed foods, saturated fats and excess sugars.

## 2021-02-18 NOTE — Progress Notes (Signed)
Cardiology Office Note:    Date:  02/19/2021   ID:  [name], DOB 1960/11/25, MRN [id number]  PCP:  Storm Frisk, MD  Cardiologist:  [name], MD   Referring MD: No ref. provider found   Chief Complaint  Patient presents with   Coronary Artery Disease   Hypertension   Follow-up    Hypertrophic cardiomyopathy    History of Present Illness:    [name] is a 60 y.o. male with a hx of CAD (recent cutting balloon angioplasty of the ramus intermedius on 04/30/2019), apical variant hypertrophic cardiomyopathy, GERD, chronic renal insufficiency, chronic back pain, hyperlipidemia (on rosuvastatin and Repatha), hypertension, tobacco abuse, prior CVA and migraine headaches  Has recurring angina with possible component of microvascular disease suspected.   [name] is working every day.  He is having no difficulty.  He denies orthopnea, PND, angina, nitroglycerin use, syncope, palpitations, and transient neurological complaints.  Past Medical History:  Diagnosis Date   Apical variant hypertrophic cardiomyopathy (HCC)    CAD (coronary artery disease)    a. LHC in 2011 with severe dz in D1/D2 and very distal LAD--> too small for PCI and managed medically.  b.  LHC in 10/2013 after an abnormal nuclear study w/ non-obst dz/c. 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   HOH (hard of hearing)    rgiht ear   Hyperlipidemia    Hypertension    Migraine headache    Stroke [facility])     Past Surgical History:  Procedure Laterality Date   CARDIAC CATHETERIZATION N/A 02/24/2016   Procedure: Left Heart Cath and Coronary Angiography;  Surgeon:  Tonny Bollman, MD;  Location: [facility] INVASIVE CV LAB;  Service: Cardiovascular;  Laterality: N/A;   CARDIAC CATHETERIZATION N/A 02/24/2016   Procedure: Coronary Stent Intervention;  Surgeon: Tonny Bollman, MD;  Location: [facility] INVASIVE CV LAB;  Service: Cardiovascular;  Laterality: N/A;   CORONARY ANGIOPLASTY WITH STENT PLACEMENT  2017   CORONARY BALLOON ANGIOPLASTY N/A 04/30/2019   Procedure: CORONARY BALLOON ANGIOPLASTY;  Surgeon: Swaziland, Peter M, MD;  Location: [facility] INVASIVE CV LAB;  Service: Cardiovascular;  Laterality: N/A;   INGUINAL HERNIA REPAIR     LEFT HEART CATH AND CORONARY ANGIOGRAPHY N/A 04/30/2019   Procedure: LEFT HEART CATH AND CORONARY ANGIOGRAPHY;  Surgeon: Dolores Patty, MD;  Location: [facility] INVASIVE CV LAB;  Service: Cardiovascular;  Laterality: N/A;   LEFT HEART CATH AND CORONARY ANGIOGRAPHY N/A 05/14/2019   Procedure: LEFT HEART CATH AND CORONARY ANGIOGRAPHY;  Surgeon: Swaziland, Peter M, MD;  Location: [facility] INVASIVE CV LAB;  Service: Cardiovascular;  Laterality: N/A;   LEFT HEART CATHETERIZATION WITH CORONARY ANGIOGRAM N/A 11/22/2013   Procedure: LEFT HEART CATHETERIZATION WITH CORONARY ANGIOGRAM;  Surgeon: [name], MD;  Location: [facility] CATH LAB;  Service: Cardiovascular;  Laterality: N/A;   TEE WITHOUT CARDIOVERSION N/A 08/22/2015   Procedure: TRANSESOPHAGEAL ECHOCARDIOGRAM (TEE);  Surgeon: Vesta Mixer, MD;  Location: [facility] ENDOSCOPY;  Service: Cardiovascular;  Laterality: N/A;    Current Medications: Current Meds  Medication Sig   acetaminophen (TYLENOL) 500 MG tablet Take 2 tablets (1,000  mg total) by mouth every 6 (six) hours as needed.   aspirin 81 MG chewable tablet Chew 1 tablet (81 mg total) by mouth daily.   baclofen (LIORESAL) 10 MG tablet Take 1 tablet (10 mg total) by mouth 3 (three) times daily as needed for muscle spasms.   clopidogrel (PLAVIX) 75 MG tablet TAKE 1 TABLET (75 MG TOTAL) BY MOUTH DAILY.   ibuprofen (ADVIL) 800 MG tablet Take 800 mg by mouth  every 6 (six) hours as needed.   losartan-hydrochlorothiazide (HYZAAR) 100-25 MG tablet TAKE 1 TABLET BY MOUTH DAILY.   nicotine polacrilex (NICORETTE MINI) 4 MG lozenge Use 1-3 daily to quit smoking   nitroGLYCERIN (NITROSTAT) 0.4 MG SL tablet Place 1 tablet (0.4 mg total) under the tongue every 5 (five) minutes as needed for chest pain.   ranolazine (RANEXA) 500 MG 12 hr tablet TAKE 1 TABLET (500 MG TOTAL) BY MOUTH 2 (TWO) TIMES DAILY.   rosuvastatin (CRESTOR) 40 MG tablet TAKE 1 TABLET (40 MG TOTAL) BY MOUTH DAILY.   thiamine ([employer] B-1) 100 MG tablet Take 1 tablet (100 mg total) by mouth every other day.   traMADol (ULTRAM) 50 MG tablet Take 50 mg by mouth every 6 (six) hours.     Allergies:   Lisinopril   Social History   Socioeconomic History   Marital status: Married    Spouse name: Not on file   Number of children: Not on file   Years of education: Not on file   Highest education level: Not on file  Occupational History   Not on file  Tobacco Use   Smoking status: Some Days    Packs/day: 0.25    Types: Cigarettes   Smokeless tobacco: Never  Vaping Use   Vaping Use: Never used  Substance and Sexual Activity   Alcohol use: No    Alcohol/week: 0.0 standard drinks   Drug use: No   Sexual activity: Not Currently  Other Topics Concern   Not on file  Social History Narrative   Not on file   Social Determinants of Health   Financial Resource Strain: Not on file  Food Insecurity: Not on file  Transportation Needs: Not on file  Physical Activity: Not on file  Stress: Not on file  Social Connections: Not on file     Family History: The patient's family history includes Colon cancer in his paternal uncle; Colon cancer (age of onset: 62) in his father; Heart failure in his mother; Hypertension in his father and mother.  ROS:   Please see the history of present illness.    Has not had angina since last being seen.  Tolerating his work without difficulty.  All other  systems reviewed and are negative.  EKGs/Labs/Other Studies Reviewed:    The following studies were reviewed today: No new data  EKG:  EKG sinus bradycardia, left ventricular hypertrophy with secondary ST-T wave change.  Pattern is consistent with hypertrophic cardiomyopathy.  Recent Labs: 12/11/2020: ALT 10; BUN 14; Creatinine, Ser 1.30; Hemoglobin 12.7; Platelets 286; Potassium 3.9; Sodium 143  Recent Lipid Panel    Component Value Date/Time   CHOL 95 (L) 12/11/2020 1204   TRIG 41 12/11/2020 1204   HDL 48 12/11/2020 1204   CHOLHDL 2.0 12/11/2020 1204   CHOLHDL 1.8 05/12/2019 0317   VLDL 7 05/12/2019 0317   LDLCALC 36 12/11/2020 1204    Physical Exam:    VS:  BP 120/82   Pulse (!) 58   Ht 5\' 8"  (1.727  m)   Wt 173 lb 3.2 oz (78.6 kg)   SpO2 96%   BMI 26.33 kg/m     Wt Readings from Last 3 Encounters:  02/19/21 173 lb 3.2 oz (78.6 kg)  02/11/21 171 lb (77.6 kg)  12/11/20 174 lb (78.9 kg)     GEN: Healthy appearing. No acute distress HEENT: Normal NECK: No JVD. LYMPHATICS: No lymphadenopathy CARDIAC: No murmur. RRR no gallop, or edema. VASCULAR:  Normal Pulses. No bruits. RESPIRATORY:  Clear to auscultation without rales, wheezing or rhonchi  ABDOMEN: Soft, non-tender, non-distended, No pulsatile mass, MUSCULOSKELETAL: No deformity  SKIN: Warm and dry NEUROLOGIC:  Alert and oriented x 3 PSYCHIATRIC:  Normal affect   ASSESSMENT:    1. 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 Apical variant hypertrophic cardiomyopathy (HCC)   3. Hyperlipidemia, unspecified hyperlipidemia type   4. Essential hypertension   5. Tobacco abuse   6. Palpitations    PLAN:    In order of problems listed above:  Secondary prevention discussed.  Continue Ranexa Cardiac MRI to characterize hypertrophic cardiomyopathy.  My clinical suspicion is that he has apical variant.  No clinical features of obstructive. Continue  aggressive management of elevated lipids.  Including rosuvastatin 40 mg/day.  LDL target less than 70. Continue excellent blood pressure control with Hyzaar. Rarely smokes a cigarette currently. Cardiac MRI is being performed then if we confirm significant hypertrophic cardiomyopathy, monitor will be necessary.   Medication Adjustments/Labs and Tests Ordered: Current medicines are reviewed at length with the patient today.  Concerns regarding medicines are outlined above.  Orders Placed This Encounter  Procedures   MR CARDIAC MORPHOLOGY W WO CONTRAST   EKG 12-Lead   No orders of the defined types were placed in this encounter.   Patient Instructions  Medication Instructions:  Your physician recommends that you continue on your current medications as directed. Please refer to the Current Medication list given to you today.  *If you need a refill on your cardiac medications before your next appointment, please call your pharmacy*   Lab Work: None If you have labs (blood work) drawn today and your tests are completely normal, you will receive your results only by: MyChart Message (if you have MyChart) OR A paper copy in the mail If you have any lab test that is abnormal or we need to change your treatment, we will call you to review the results.   Testing/Procedures: Your physician recommends that you have a Cardiac MRI performed.   Follow-Up: At [facility], you and your health needs are our priority.  As part of our continuing mission to provide you with exceptional heart care, we have created designated Provider Care Teams.  These Care Teams include your primary Cardiologist (physician) and Advanced Practice Providers (APPs -  Physician Assistants and Nurse Practitioners) who all work together to provide you with the care you need, when you need it.  We recommend signing up for the patient portal called "MyChart".  Sign up information is provided on this After Visit Summary.   MyChart is used to connect with patients for Virtual Visits (Telemedicine).  Patients are able to view lab/test results, encounter notes, upcoming appointments, etc.  Non-urgent messages can be sent to your provider as well.   To learn more about what you can do with MyChart, go to [email or web address].    Your next appointment:   6 month(s)  The format for your next appointment:  In Person  Provider:   You may see [name], MD or one of the following Advanced Practice Providers on your designated Care Team:   Nada Boozer, NP    Other Instructions     Signed, [name], MD  02/19/2021 9:59 AM    [facility]

## 2021-02-19 ENCOUNTER — Ambulatory Visit (INDEPENDENT_AMBULATORY_CARE_PROVIDER_SITE_OTHER): Payer: Self-pay | Admitting: Interventional Cardiology

## 2021-02-19 ENCOUNTER — Encounter: Payer: Self-pay | Admitting: Interventional Cardiology

## 2021-02-19 ENCOUNTER — Other Ambulatory Visit: Payer: Self-pay

## 2021-02-19 VITALS — BP 120/82 | HR 58 | Ht 68.0 in | Wt 173.2 lb

## 2021-02-19 DIAGNOSIS — I25119 Atherosclerotic heart disease of native coronary artery with unspecified angina pectoris: Secondary | ICD-10-CM

## 2021-02-19 DIAGNOSIS — I10 Essential (primary) hypertension: Secondary | ICD-10-CM

## 2021-02-19 DIAGNOSIS — Z720 Tobacco use: Secondary | ICD-10-CM

## 2021-02-19 DIAGNOSIS — E785 Hyperlipidemia, unspecified: Secondary | ICD-10-CM

## 2021-02-19 DIAGNOSIS — R002 Palpitations: Secondary | ICD-10-CM

## 2021-02-19 DIAGNOSIS — I422 Other hypertrophic cardiomyopathy: Secondary | ICD-10-CM

## 2021-02-19 NOTE — Patient Instructions (Signed)
Medication Instructions:  Your physician recommends that you continue on your current medications as directed. Please refer to the Current Medication list given to you today.  *If you need a refill on your cardiac medications before your next appointment, please call your pharmacy*   Lab Work: None If you have labs (blood work) drawn today and your tests are completely normal, you will receive your results only by: MyChart Message (if you have MyChart) OR A paper copy in the mail If you have any lab test that is abnormal or we need to change your treatment, we will call you to review the results.   Testing/Procedures: Your physician recommends that you have a Cardiac MRI performed.   Follow-Up: At [facility], you and your health needs are our priority.  As part of our continuing mission to provide you with exceptional heart care, we have created designated Provider Care Teams.  These Care Teams include your primary Cardiologist (physician) and Advanced Practice Providers (APPs -  Physician Assistants and Nurse Practitioners) who all work together to provide you with the care you need, when you need it.  We recommend signing up for the patient portal called "MyChart".  Sign up information is provided on this After Visit Summary.  MyChart is used to connect with patients for Virtual Visits (Telemedicine).  Patients are able to view lab/test results, encounter notes, upcoming appointments, etc.  Non-urgent messages can be sent to your provider as well.   To learn more about what you can do with MyChart, go to [email or web address].    Your next appointment:   6 month(s)  The format for your next appointment:   In Person  Provider:   You may see [name], MD or one of the following Advanced Practice Providers on your designated Care Team:   Nada Boozer, NP    Other Instructions

## 2021-02-25 ENCOUNTER — Ambulatory Visit: Payer: Self-pay

## 2021-03-06 ENCOUNTER — Other Ambulatory Visit: Payer: Self-pay

## 2021-03-16 ENCOUNTER — Emergency Department (HOSPITAL_COMMUNITY): Payer: 59

## 2021-03-16 ENCOUNTER — Emergency Department (HOSPITAL_COMMUNITY)
Admission: EM | Admit: 2021-03-16 | Discharge: 2021-03-16 | Disposition: A | Discharge status: Home / Self Care | Payer: 59 | Attending: Emergency Medicine | Admitting: Emergency Medicine

## 2021-03-16 ENCOUNTER — Other Ambulatory Visit: Payer: Self-pay

## 2021-03-16 DIAGNOSIS — Z5321 Procedure and treatment not carried out due to patient leaving prior to being seen by health care provider: Secondary | ICD-10-CM | POA: Diagnosis not present

## 2021-03-16 DIAGNOSIS — R0789 Other chest pain: Secondary | ICD-10-CM | POA: Diagnosis not present

## 2021-03-16 LAB — BASIC METABOLIC PANEL
Anion gap: 10 (ref 5–15)
BUN: 18 mg/dL (ref 6–20)
CO2: 25 mmol/L (ref 22–32)
Calcium: 9.1 mg/dL (ref 8.9–10.3)
Chloride: 103 mmol/L (ref 98–111)
Creatinine, Ser: 1.21 mg/dL (ref 0.61–1.24)
GFR, Estimated: >60 mL/min (ref >60)
Glucose, Bld: 81 mg/dL (ref 70–99)
Potassium: 3.5 mmol/L (ref 3.5–5.1)
Sodium: 138 mmol/L (ref 135–145)

## 2021-03-16 LAB — CBC
HCT: 37.7 % — ABNORMAL LOW (ref 39.0–52.0)
Hemoglobin: 12 g/dL — ABNORMAL LOW (ref 13.0–17.0)
MCH: 29.8 pg (ref 26.0–34.0)
MCHC: 31.8 g/dL (ref 30.0–36.0)
MCV: 93.5 fL (ref 80.0–100.0)
Platelets: 247 10*3/uL (ref 150–400)
RBC: 4.03 MIL/uL — ABNORMAL LOW (ref 4.22–5.81)
RDW: 11.8 % (ref 11.5–15.5)
WBC: 4.6 10*3/uL (ref 4.0–10.5)
nRBC: 0 % (ref 0.0–0.2)

## 2021-03-16 LAB — TROPONIN I (HIGH SENSITIVITY)
Troponin I (High Sensitivity): 5 ng/L (ref <18)
Troponin I (High Sensitivity): 5 ng/L (ref <18)

## 2021-03-16 NOTE — ED Provider Notes (Signed)
Emergency Medicine Provider Triage Evaluation Note  Darryn Kydd , a 60 y.o. male  was evaluated in triage.  Pt complains of left chest pain that started pta while driving, also c/o pleuritic pain. Has had cough.  Review of Systems  Positive: Chest pain, pleuritic pain, cough Negative: fever  Physical Exam  There were no vitals taken for this visit. Gen:   Awake, no distress   Resp:  Normal effort  MSK:   Moves extremities without difficulty  Other:  Decreased breath sounds on the left  Medical Decision Making  Medically screening exam initiated at 1:56 PM.  Appropriate orders placed.  Lilton Pare was informed that the remainder of the evaluation will be completed by another provider, this initial triage assessment does not replace that evaluation, and the importance of remaining in the ED until their evaluation is complete.  Contacted xray to take pt for xray next due to concern for ptx.   Karrie Meres, PA-C 03/16/21 2050    Mancel Bale, MD 03/16/21 2109

## 2021-03-16 NOTE — ED Triage Notes (Signed)
Patient arrives via POV with complaints of chest pain x2 hours. Patient was at work when he started having left-sided chest pain that does not radiate. Rates pain a 6/10.   States he had a previous MI a couple years ago that he received a stent for.

## 2021-04-14 ENCOUNTER — Other Ambulatory Visit: Payer: Self-pay | Admitting: Critical Care Medicine

## 2021-04-14 DIAGNOSIS — I25119 Atherosclerotic heart disease of native coronary artery with unspecified angina pectoris: Secondary | ICD-10-CM

## 2021-04-14 DIAGNOSIS — E785 Hyperlipidemia, unspecified: Secondary | ICD-10-CM

## 2021-04-14 MED ORDER — ROSUVASTATIN CALCIUM 40 MG PO TABS
ORAL_TABLET | Freq: Every day | ORAL | 0 refills | Status: DC
Start: 2021-04-14 — End: 2021-10-08
  Filled 2021-04-14: qty 30, 30d supply, fill #0
  Filled 2021-05-28: qty 30, 30d supply, fill #1
  Filled 2021-07-06: qty 30, 30d supply, fill #2
  Filled 2021-07-07: qty 30, 30d supply, fill #0

## 2021-04-14 NOTE — Telephone Encounter (Signed)
Requested Prescriptions  Pending Prescriptions Disp Refills  . rosuvastatin (CRESTOR) 40 MG tablet 90 tablet 0    Sig: TAKE 1 TABLET (40 MG TOTAL) BY MOUTH DAILY.     Cardiovascular:  Antilipid - Statins Failed - 04/14/2021 10:38 PM      Failed - Total Cholesterol in normal range and within 360 days    Cholesterol, Total  Date Value Ref Range Status  12/11/2020 95 (L) 100 - 199 mg/dL Final         Passed - LDL in normal range and within 360 days    LDL Chol Calc (NIH)  Date Value Ref Range Status  12/11/2020 36 0 - 99 mg/dL Final         Passed - HDL in normal range and within 360 days    HDL  Date Value Ref Range Status  12/11/2020 48 >39 mg/dL Final         Passed - Triglycerides in normal range and within 360 days    Triglycerides  Date Value Ref Range Status  12/11/2020 41 0 - 149 mg/dL Final         Passed - Patient is not pregnant      Passed - Valid encounter within last 12 months    Recent Outpatient Visits          2 months ago Essential hypertension   [facility] Storm Frisk, MD   4 months ago Essential hypertension   [facility] Storm Frisk, MD   7 months ago Right-sided low back pain with right-sided sciatica, unspecified chronicity   [facility] [address] [facility] [place], [place], [place]   11 months ago Essential hypertension   [place] [address] [facility] [place], [place], [place]   1 year ago Rash and nonspecific skin eruption   [place] [facility] [place], [place]      Future Appointments            In 4 months Katrinka Blazing, Barry Dienes, MD [facility], [facility]

## 2021-04-15 ENCOUNTER — Other Ambulatory Visit: Payer: Self-pay

## 2021-04-16 ENCOUNTER — Other Ambulatory Visit (HOSPITAL_COMMUNITY): Payer: Self-pay

## 2021-04-20 ENCOUNTER — Telehealth: Payer: Self-pay | Admitting: Interventional Cardiology

## 2021-04-20 NOTE — Telephone Encounter (Signed)
Called and spoke to pt and they stated that they are still taking it but they have started a new job and will bring [place] their new card so I can process a new pa. I will route to megan supple to make her aware

## 2021-04-20 NOTE — Telephone Encounter (Signed)
Pt c/o medication issue:  1. Name of Medication:  Rephatha Sureclick 140 MG  2. How are you currently taking this medication (dosage and times per day)?   3. Are you having a reaction (difficulty breathing--STAT)?   4. What is your medication issue?   Patient's wife is requesting assistance with a new application for [employer] pt assistance for Repatha. Please assist.

## 2021-04-22 ENCOUNTER — Encounter: Payer: Self-pay | Admitting: Interventional Cardiology

## 2021-04-27 ENCOUNTER — Other Ambulatory Visit: Payer: Self-pay

## 2021-04-27 MED ORDER — PRALUENT 150 MG/ML ~~LOC~~ SOAJ: 150.0000 mg | SUBCUTANEOUS | 11 refills | Status: DC

## 2021-05-15 NOTE — Progress Notes (Signed)
This encounter was created in error - please disregard.

## 2021-05-28 ENCOUNTER — Other Ambulatory Visit: Payer: Self-pay

## 2021-05-28 ENCOUNTER — Other Ambulatory Visit: Payer: Self-pay | Admitting: Critical Care Medicine

## 2021-05-28 DIAGNOSIS — I25119 Atherosclerotic heart disease of native coronary artery with unspecified angina pectoris: Secondary | ICD-10-CM

## 2021-05-28 MED ORDER — RANOLAZINE ER 500 MG PO TB12
ORAL_TABLET | Freq: Two times a day (BID) | ORAL | 0 refills | Status: DC
Start: 2021-05-28 — End: 2021-10-08
  Filled 2021-05-28: qty 60, 30d supply, fill #0

## 2021-06-01 ENCOUNTER — Other Ambulatory Visit: Payer: Self-pay

## 2021-06-24 ENCOUNTER — Encounter: Payer: Self-pay | Admitting: *Deleted

## 2021-07-07 ENCOUNTER — Other Ambulatory Visit: Payer: Self-pay

## 2021-07-08 ENCOUNTER — Other Ambulatory Visit: Payer: Self-pay

## 2021-07-09 ENCOUNTER — Other Ambulatory Visit: Payer: Self-pay

## 2021-07-23 NOTE — Telephone Encounter (Signed)
Spoke with wife and she states they received approval letter for Cardiac MRI but it had a suggestion to have test done at a local [facility] or [facility] to help save on cost.  She contacted these places and they made her aware that they do not do cardiac MRIs so she reached out to the insurance.  Insurance had switched the approval to [facility] and now can't switch it back.  They told her to contact our office about calling them and resubmitting the information needed for pre approval and to let them know that [facility] is the only place it can be done at and they may increase the amount they cover.  Advised I will send information to our pre cert and billing dept.  Wife appreciative for call.

## 2021-08-17 ENCOUNTER — Ambulatory Visit: Payer: Self-pay | Admitting: Interventional Cardiology

## 2021-09-03 ENCOUNTER — Encounter: Payer: Self-pay | Admitting: Interventional Cardiology

## 2021-09-03 NOTE — Telephone Encounter (Signed)
Error

## 2021-09-07 ENCOUNTER — Ambulatory Visit (HOSPITAL_COMMUNITY): Admission: RE | Admit: 2021-09-07 | Payer: 59 | Source: Ambulatory Visit

## 2021-09-07 NOTE — Progress Notes (Signed)
?Cardiology Office Note:   ? ?Date:  09/09/2021  ? ?ID:  [name], DOB 01/01/1961, MRN [id number] ? ?PCP:  Storm Frisk, MD  ?Cardiologist:  [name], MD  ? ?Referring MD: Storm Frisk, MD  ? ?Chief Complaint  ?Patient presents with  ? Congestive Heart Failure  ?  Apical hypertrophic cardiomyopathy  ? ? ?History of Present Illness:   ? ?[name] is a 61 y.o. male with a hx of  CAD (recent cutting balloon angioplasty of the ramus intermedius on 04/30/2019), apical variant hypertrophic cardiomyopathy, GERD, chronic renal insufficiency, chronic back pain, hyperlipidemia (on rosuvastatin and Repatha), hypertension, tobacco abuse, prior CVA and migraine headaches  Has recurring angina with possible component of microvascular disease suspected. ? ?Cannot afford to have MRI performed as the co-pay was too expensive. ? ?Occasional palpitation/chest discomfort.  Cardiac symptoms are not preventing or interfering with work.  Denies orthopnea, PND, prolonged palpitations, syncope, or angina.  Denies peripheral edema. ? ?Past Medical History:  ?Diagnosis Date  ? Apical variant hypertrophic cardiomyopathy (HCC)   ? CAD (coronary artery disease)   ? a. LHC in 2011 with severe dz in D1/D2 and very distal LAD--> too small for PCI and managed medically.  b.  LHC in 10/2013 after an abnormal nuclear study w/ non-obst dz/c. 01/2016 NSTEMI 2.5 x 38 mm Promus DES to Ramus    ? Chronic back pain   ? History of echocardiogram   ? 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  ? HOH (hard of hearing)   ? rgiht ear  ? Hyperlipidemia   ? Hypertension   ? Migraine headache   ? Stroke [facility])   ? ? ?Past Surgical History:  ?Procedure Laterality Date  ? CARDIAC CATHETERIZATION N/A 02/24/2016  ?  Procedure: Left Heart Cath and Coronary Angiography;  Surgeon: Tonny Bollman, MD;  Location: [facility] INVASIVE CV LAB;  Service: Cardiovascular;  Laterality: N/A;  ? CARDIAC CATHETERIZATION N/A 02/24/2016  ? Procedure: Coronary Stent Intervention;  Surgeon: Tonny Bollman, MD;  Location: [facility] INVASIVE CV LAB;  Service: Cardiovascular;  Laterality: N/A;  ? CORONARY ANGIOPLASTY WITH STENT PLACEMENT  2017  ? CORONARY BALLOON ANGIOPLASTY N/A 04/30/2019  ? Procedure: CORONARY BALLOON ANGIOPLASTY;  Surgeon: Swaziland, Peter M, MD;  Location: [facility] INVASIVE CV LAB;  Service: Cardiovascular;  Laterality: N/A;  ? INGUINAL HERNIA REPAIR    ? LEFT HEART CATH AND CORONARY ANGIOGRAPHY N/A 04/30/2019  ? Procedure: LEFT HEART CATH AND CORONARY ANGIOGRAPHY;  Surgeon: Dolores Patty, MD;  Location: [facility] INVASIVE CV LAB;  Service: Cardiovascular;  Laterality: N/A;  ? LEFT HEART CATH AND CORONARY ANGIOGRAPHY N/A 05/14/2019  ? Procedure: LEFT HEART CATH AND CORONARY ANGIOGRAPHY;  Surgeon: Swaziland, Peter M, MD;  Location: [facility] INVASIVE CV LAB;  Service: Cardiovascular;  Laterality: N/A;  ? LEFT HEART CATHETERIZATION WITH CORONARY ANGIOGRAM N/A 11/22/2013  ? Procedure: LEFT HEART CATHETERIZATION WITH CORONARY ANGIOGRAM;  Surgeon: [name], MD;  Location: [facility] CATH LAB;  Service: Cardiovascular;  Laterality: N/A;  ? TEE WITHOUT CARDIOVERSION N/A 08/22/2015  ? Procedure: TRANSESOPHAGEAL ECHOCARDIOGRAM (TEE);  Surgeon: Vesta Mixer, MD;  Location: [facility] ENDOSCOPY;  Service: Cardiovascular;  Laterality: N/A;  ? ? ?Current Medications: ?Current Meds  ?Medication Sig  ? Alirocumab (  PRALUENT) 150 MG/ML SOAJ Inject 150 mg into the skin every 14 (fourteen) days.  ? aspirin 81 MG chewable tablet Chew 1 tablet (81 mg total) by mouth daily.  ? clopidogrel (PLAVIX) 75 MG tablet TAKE 1 TABLET (75 MG TOTAL) BY MOUTH DAILY.  ? losartan-hydrochlorothiazide (HYZAAR) 100-25 MG tablet TAKE 1 TABLET BY MOUTH DAILY.  ? nicotine polacrilex (NICORETTE MINI) 4 MG lozenge  Use 1-3 daily to quit smoking  ? ranolazine (RANEXA) 500 MG 12 hr tablet TAKE 1 TABLET (500 MG TOTAL) BY MOUTH 2 (TWO) TIMES DAILY.  ? rosuvastatin (CRESTOR) 40 MG tablet TAKE 1 TABLET (40 MG TOTAL) BY MOUTH DAILY.  ? traMADol (ULTRAM) 50 MG tablet Take 50 mg by mouth every 6 (six) hours.  ?  ? ?Allergies:   Lisinopril  ? ?Social History  ? ?Socioeconomic History  ? Marital status: Married  ?  Spouse name: Not on file  ? Number of children: Not on file  ? Years of education: Not on file  ? Highest education level: Not on file  ?Occupational History  ? Not on file  ?Tobacco Use  ? Smoking status: Some Days  ?  Packs/day: 0.25  ?  Types: Cigarettes  ? Smokeless tobacco: Never  ?Vaping Use  ? Vaping Use: Never used  ?Substance and Sexual Activity  ? Alcohol use: No  ?  Alcohol/week: 0.0 standard drinks  ? Drug use: No  ? Sexual activity: Not Currently  ?Other Topics Concern  ? Not on file  ?Social History Narrative  ? Not on file  ? ?Social Determinants of Health  ? ?Financial Resource Strain: Not on file  ?Food Insecurity: Not on file  ?Transportation Needs: Not on file  ?Physical Activity: Not on file  ?Stress: Not on file  ?Social Connections: Not on file  ?  ? ?Family History: ?The patient's family history includes Colon cancer in his paternal uncle; Colon cancer (age of onset: 70) in his father; Heart failure in his mother; Hypertension in his father and mother. ? ?ROS:   ?Please see the history of present illness.    ?Under a lot of stress.  Attempting to buy a new house.  Paying down bills to get a better credit score and interest rate on his mortgage loan.  All other systems reviewed and are negative. ? ?EKGs/Labs/Other Studies Reviewed:   ? ?The following studies were reviewed today: ?Long-term monitor January 2021: ? ?Study Highlights ? ?Normal sinus rhythm as the basic mechanism. ?Occasional PVCs are noted. No PACs are noted. ?Intermittent AV block, both type I and type II Mobitz are seen during sleep with  pauses up to 5 seconds. ?Nonsustained VT, 5 beats not associated with symptoms. ?No atrial fibrillation or flutter. ?Complaints correlate with both PACs and PVCs. ?  ?Doppler echocardiogram 12/12/2018: ?IMPRESSIONS  ? 1. The left ventricle has hyperdynamic systolic function, with an  ?ejection fraction of >65%. The cavity size was normal. There is severe  ?hypertrophy of the apical and mid segments. Left ventricular diastolic  ?parameters were normal.  ? 2. The right ventricle has normal systolic function. The cavity was  ?normal. There is no increase in right ventricular wall thickness.  ? ?2D Doppler echocardiogram performed 04/29/2019: ?IMPRESSIONS  ? 1. Left ventricular ejection fraction, by visual estimation, is 60 to  ?65%. The left ventricle has normal function. Left ventricular septal wall  ?thickness was mildly increased. Mildly increased left ventricular  ?posterior wall thickness. There is no left  ?ventricular hypertrophy.  ?  2. Global right ventricle has normal systolic function.The right  ?ventricular size is normal. No increase in right ventricular wall  ?thickness.  ? 3. Left atrial size was mildly dilated.  ? 4. Right atrial size was normal.  ? 5. The mitral valve is normal in structure. Trace mitral valve  ?regurgitation. No evidence of mitral stenosis.  ? 6. The tricuspid valve is normal in structure. Tricuspid valve  ?regurgitation is not demonstrated.  ? 7. The aortic valve was not well visualized. Aortic valve regurgitation  ?is not visualized. No evidence of aortic valve sclerosis or stenosis.  ? 8. The pulmonic valve was normal in structure. Pulmonic valve  ?regurgitation is not visualized.  ? 9. The inferior vena cava is normal in size with greater than 50%  ?respiratory variability, suggesting right atrial pressure of 3 mmHg.  ? ? ?EKG:  EKG performed 03/17/2021, demonstrates left ventricular hypertrophy with strain pattern unchanged from prior tracings.  Left atrial abnormality.  Normal  sinus rhythm. ? ?Recent Labs: ?12/11/2020: ALT 10 ?03/16/2021: BUN 18; Creatinine, Ser 1.21; Hemoglobin 12.0; Platelets 247; Potassium 3.5; Sodium 138  ?Recent Lipid Panel ?   ?Component Value Date/Time  ?

## 2021-09-09 ENCOUNTER — Ambulatory Visit: Payer: 59 | Admitting: Interventional Cardiology

## 2021-09-09 ENCOUNTER — Encounter: Payer: Self-pay | Admitting: Interventional Cardiology

## 2021-09-09 VITALS — BP 118/80 | HR 71 | Ht 68.0 in | Wt 174.8 lb

## 2021-09-09 DIAGNOSIS — I25119 Atherosclerotic heart disease of native coronary artery with unspecified angina pectoris: Secondary | ICD-10-CM | POA: Diagnosis not present

## 2021-09-09 DIAGNOSIS — Z720 Tobacco use: Secondary | ICD-10-CM

## 2021-09-09 DIAGNOSIS — I422 Other hypertrophic cardiomyopathy: Secondary | ICD-10-CM | POA: Diagnosis not present

## 2021-09-09 DIAGNOSIS — I10 Essential (primary) hypertension: Secondary | ICD-10-CM

## 2021-09-09 DIAGNOSIS — E785 Hyperlipidemia, unspecified: Secondary | ICD-10-CM

## 2021-09-09 NOTE — Patient Instructions (Signed)
Medication Instructions:  ?Your physician recommends that you continue on your current medications as directed. Please refer to the Current Medication list given to you today. ? ?*If you need a refill on your cardiac medications before your next appointment, please call your pharmacy* ? ? ?Lab Work: ?None ?If you have labs (blood work) drawn today and your tests are completely normal, you will receive your results only by: ?MyChart Message (if you have MyChart) OR ?A paper copy in the mail ?If you have any lab test that is abnormal or we need to change your treatment, we will call you to review the results. ? ? ?Testing/Procedures: ?None ? ? ?Follow-Up: ?At [facility], you and your health needs are our priority.  As part of our continuing mission to provide you with exceptional heart care, we have created designated Provider Care Teams.  These Care Teams include your primary Cardiologist (physician) and Advanced Practice Providers (APPs -  Physician Assistants and Nurse Practitioners) who all work together to provide you with the care you need, when you need it. ? ?We recommend signing up for the patient portal called "MyChart".  Sign up information is provided on this After Visit Summary.  MyChart is used to connect with patients for Virtual Visits (Telemedicine).  Patients are able to view lab/test results, encounter notes, upcoming appointments, etc.  Non-urgent messages can be sent to your provider as well.   ?To learn more about what you can do with MyChart, go to [email or web address].   ? ?Your next appointment:   ?9 month(s) ? ?The format for your next appointment:   ?In Person ? ?Provider:   ?[name], MD  ? ? ?Other Instructions ? ?Apical Hypertrophic Cardiomyopathy-  Give this information to your children and have them talk to their Primary Care about getting an echocardiogram to monitor their heart. ? ?Important Information About Sugar ? ? ? ? ?  ?

## 2021-10-08 ENCOUNTER — Encounter: Payer: Self-pay | Admitting: Physician Assistant

## 2021-10-08 ENCOUNTER — Other Ambulatory Visit: Payer: Self-pay | Admitting: Critical Care Medicine

## 2021-10-08 ENCOUNTER — Ambulatory Visit: Payer: 59 | Attending: Physician Assistant | Admitting: Physician Assistant

## 2021-10-08 ENCOUNTER — Ambulatory Visit: Payer: Self-pay

## 2021-10-08 ENCOUNTER — Other Ambulatory Visit: Payer: Self-pay

## 2021-10-08 VITALS — BP 139/85 | HR 72 | Temp 97.9°F | Resp 18 | Ht 68.0 in | Wt 167.0 lb

## 2021-10-08 DIAGNOSIS — I25119 Atherosclerotic heart disease of native coronary artery with unspecified angina pectoris: Secondary | ICD-10-CM | POA: Diagnosis not present

## 2021-10-08 DIAGNOSIS — I10 Essential (primary) hypertension: Secondary | ICD-10-CM

## 2021-10-08 DIAGNOSIS — E785 Hyperlipidemia, unspecified: Secondary | ICD-10-CM

## 2021-10-08 DIAGNOSIS — K5900 Constipation, unspecified: Secondary | ICD-10-CM | POA: Diagnosis not present

## 2021-10-08 DIAGNOSIS — R1013 Epigastric pain: Secondary | ICD-10-CM

## 2021-10-08 DIAGNOSIS — F17200 Nicotine dependence, unspecified, uncomplicated: Secondary | ICD-10-CM | POA: Diagnosis not present

## 2021-10-08 MED ORDER — LOSARTAN POTASSIUM-HCTZ 100-25 MG PO TABS
1.0000 | ORAL_TABLET | Freq: Every day | ORAL | 1 refills | Status: DC
  Filled 2021-10-08: qty 30, 30d supply, fill #0

## 2021-10-08 MED ORDER — CLOPIDOGREL BISULFATE 75 MG PO TABS
ORAL_TABLET | Freq: Every day | ORAL | 3 refills | Status: DC
  Filled 2021-10-08: qty 30, 30d supply, fill #0

## 2021-10-08 MED ORDER — RANOLAZINE ER 500 MG PO TB12
ORAL_TABLET | Freq: Two times a day (BID) | ORAL | 0 refills | Status: DC
  Filled 2021-10-08: qty 60, 30d supply, fill #0

## 2021-10-08 MED ORDER — OMEPRAZOLE 20 MG PO CPDR
20.0000 mg | DELAYED_RELEASE_CAPSULE | Freq: Every day | ORAL | 1 refills | Status: DC
  Filled 2021-10-08: qty 30, 30d supply, fill #0

## 2021-10-08 MED ORDER — ROSUVASTATIN CALCIUM 40 MG PO TABS
ORAL_TABLET | Freq: Every day | ORAL | 0 refills | Status: DC
  Filled 2021-10-08: qty 90, fill #0

## 2021-10-08 MED ORDER — OMEPRAZOLE 20 MG PO CPDR: 20.0000 mg | DELAYED_RELEASE_CAPSULE | Freq: Every day | ORAL | 3 refills | Status: DC

## 2021-10-08 NOTE — Progress Notes (Signed)
Patient ID: [name], male   DOB: [birth year], 61 y.o.   MRN: [id number] ? ? ?[name], is a 61 y.o. male ? ?[id number] ? ?[id number] ? ?DOB - [birth year] ? ?Chief Complaint  ?Patient presents with  ? Medication Refill  ?    ? ?Subjective:  ? ?[name] is a 61 y.o. male here today for midepigastric pain for  about 6 weeks.  Pain is worse after eating so he has been eating less resulting in weight loss.  He has been having some constipation.  No CP(saw cARDIology last month).  Denies alcohol.  Smokes about 3/4ppd.  No melena/hematochezia.  No diarrhea/N/V.  Denies alcohol intake.  No abdominal surgeries in the past ? ? ?No problems updated. ? ?ALLERGIES: ?Allergies  ?Allergen Reactions  ? Lisinopril Swelling  ?  Facial swelling  ? ? ?PAST MEDICAL HISTORY: ?Past Medical History:  ?Diagnosis Date  ? Apical variant hypertrophic cardiomyopathy (HCC)   ? CAD (coronary artery disease)   ? a. LHC in 2011 with severe dz in D1/D2 and very distal LAD--> too small for PCI and managed medically.  b.  LHC in 10/2013 after an abnormal nuclear study w/ non-obst dz/c. 01/2016 NSTEMI 2.5 x 38 mm Promus DES to Ramus    ? Chronic back pain   ? History of echocardiogram   ? 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  ? HOH (hard of hearing)   ? rgiht ear  ? Hyperlipidemia   ? Hypertension   ? Migraine headache   ? Stroke [facility])   ? ? ?MEDICATIONS AT HOME: ?Prior to Admission medications   ?Medication Sig Start Date End Date Taking? Authorizing Provider  ?Alirocumab (PRALUENT) 150 MG/ML SOAJ Inject 150 mg into the skin every 14 (fourteen) days. 04/27/21  Yes Lyn Records, MD  ?nitroGLYCERIN (NITROSTAT) 0.4 MG SL tablet Place 1 tablet (0.4 mg total) under the tongue every 5 (five) minutes as  needed for chest pain. 12/11/20 10/08/21 Yes Storm Frisk, MD  ?traMADol (ULTRAM) 50 MG tablet Take 50 mg by mouth every 6 (six) hours. 01/29/21  Yes [provider]  ?clopidogrel (PLAVIX) 75 MG tablet TAKE 1 TABLET (75 MG TOTAL) BY MOUTH DAILY. 10/08/21 10/08/22  Anders Simmonds, PA-C  ?losartan-hydrochlorothiazide (HYZAAR) 100-25 MG tablet TAKE 1 TABLET BY MOUTH DAILY. 10/08/21 10/08/22  Anders Simmonds, PA-C  ?nicotine polacrilex (NICORETTE MINI) 4 MG lozenge Use 1-3 daily to quit smoking ?Patient not taking: Reported on 10/08/2021 12/11/20   Storm Frisk, MD  ?omeprazole (PRILOSEC) 20 MG capsule Take 1 capsule (20 mg total) by mouth daily. 10/08/21   Anders Simmonds, PA-C  ?ranolazine (RANEXA) 500 MG 12 hr tablet TAKE 1 TABLET (500 MG TOTAL) BY MOUTH 2 (TWO) TIMES DAILY. 10/08/21 10/08/22  Anders Simmonds, PA-C  ?rosuvastatin (CRESTOR) 40 MG tablet TAKE 1 TABLET (40 MG TOTAL) BY MOUTH DAILY. 10/08/21 10/08/22  Anders Simmonds, PA-C  ? ? ?ROS: ?Neg HEENT ?Neg resp ?Neg cardiac ?Neg GU ?Neg MS ?Neg psych ?Neg neuro ? ?Objective:  ? ?Vitals:  ? 10/08/21 1449  ?BP: 139/85  ?Pulse: 72  ?Resp: 18  ?Temp: 97.9 ?F (36.6 ?C)  ?TempSrc: Oral  ?SpO2: 98%  ?Weight: 167 lb (75.8 kg)  ?Height: 5'  8" (1.727 m)  ? ?Exam ?General appearance : Awake, alert, not in any distress. Speech Clear. Not toxic looking ?HEENT: Atraumatic and Normocephalic ?Neck: Supple, no JVD. No cervical lymphadenopathy.  ?Chest: Good air entry bilaterally, CTAB.  No rales/rhonchi/wheezing ?CVS: S1 S2 regular, no murmurs.  ?Abdomen: Bowel sounds present, tender in midepigastric and RUQ area and not distended with no gaurding, rigidity or rebound. ?Extremities: B/L Lower Ext shows no edema, both legs are warm to touch ?Neurology: Awake alert, and oriented X 3, CN II-XII intact, Non focal ?Skin: No Rash ? ?Data Review ?Lab Results  ?Component Value Date  ? HGBA1C 5.3 04/29/2019  ? HGBA1C  09/17/2009  ?  5.1 ?(NOTE)                                                                        According to the ADA Clinical Practice Recommendations for 2011, when HbA1c is used as a screening test:   >=6.5%   Diagnostic of Diabetes Mellitus           (if abnormal result ? is confirmed)  5.7-6.4%   Increased risk of developing Diabetes Mellitus  References:Diagnosis and Classification of Diabetes Mellitus,Diabetes Care,2011,34(Suppl 1):S62-S69 and Standards of Medical Care in         Diabetes - 2011,Diabetes YBOF,7510,25  ?(Suppl 1):S11-S61.  ? ? ?Assessment & Plan  ? ?1. Epigastric pain ?Non acute abdomen ?- H. pylori breath test ?- Comprehensive metabolic panel ?- Lipase ?- CBC with Differential/Platelet ?- Ambulatory referral to Gastroenterology ? ?2. Constipation, unspecified constipation type ?Increase water and fiber intake ?- Comprehensive metabolic panel ?- Lipase ?- CBC with Differential/Platelet ?- Ambulatory referral to Gastroenterology ? ?3. TOBACCO USE ?Smoking and dangers of nicotine have been discussed at length. Long term health consequences of smoking reviewed in detail.  Methods for helping with cessation have been reviewed.  Patient expresses understanding. ? ? ?4. Coronary artery disease involving native coronary artery of native heart with angina pectoris (HCC) ?- clopidogrel (PLAVIX) 75 MG tablet; TAKE 1 TABLET (75 MG TOTAL) BY MOUTH DAILY.  Dispense: 90 tablet; Refill: 3 ?- rosuvastatin (CRESTOR) 40 MG tablet; TAKE 1 TABLET (40 MG TOTAL) BY MOUTH DAILY.  Dispense: 90 tablet; Refill: 0 ?- Comprehensive metabolic panel ?- ranolazine (RANEXA) 500 MG 12 hr tablet; TAKE 1 TABLET (500 MG TOTAL) BY MOUTH 2 (TWO) TIMES DAILY.  Dispense: 60 tablet; Refill: 0 ? ?5. Essential hypertension ?- losartan-hydrochlorothiazide (HYZAAR) 100-25 MG tablet; TAKE 1 TABLET BY MOUTH DAILY.  Dispense: 90 tablet; Refill: 1 ? ?6. Hyperlipidemia, unspecified hyperlipidemia type ?- rosuvastatin (CRESTOR) 40 MG tablet; TAKE 1 TABLET (40 MG TOTAL) BY MOUTH DAILY.  Dispense:  90 tablet; Refill: 0 ?- Comprehensive metabolic panel ? ? ? ?Return in about 4 months (around 02/08/2022) for PCP for chronic conditions. ? ?The patient was given clear instructions to go to ER or return to medical center if symptoms don't improve, worsen or new problems develop. The patient verbalized understanding. The patient was told to call to get lab results if they haven't heard anything in the next week.  ? ? ? ? ?Georgian Co, PA-C ?[facility] ?[place], [place] ?[phone]   ?10/08/2021, 3:16 PM  ?

## 2021-10-08 NOTE — Telephone Encounter (Signed)
Copied from [place] [id number]. Topic: General - Other ?>> Oct 08, 2021  3:56 PM Tessa Lerner A wrote: ?Reason for CRM: Medication Refill - Medication: Rx #: [id number]  ?omeprazole (PRILOSEC) 20 MG capsule [id number]  ? ?Rx #: [id number]  ?clopidogrel (PLAVIX) 75 MG tablet [id number]  ? ?Rx #: [id number]  ?losartan-hydrochlorothiazide (HYZAAR) 100-25 MG tablet [id number]  ? ?Rx #: [id number]  ?rosuvastatin (CRESTOR) 40 MG tablet [id number]  ? ?Rx #: [id number]  ?ranolazine (RANEXA) 500 MG 12 hr tablet [id number]  ? ? ?Has the patient contacted their pharmacy? Yes.   ?(Agent: If no, request that the patient contact the pharmacy for the refill. If patient does not wish to contact the pharmacy document the reason why and proceed with request.) ?(Agent: If yes, when and what did the pharmacy advise?) ? ?Preferred Pharmacy (with phone number or street name): [id number] - [place], [address] ?[place] [address] ?Phone: [phone] Fax: [phone] ?Hours: Not open 24 hours ? ? ?Has the patient been seen for an appointment in the last year OR does the patient have an upcoming appointment? Yes.   ? ?Agent: Please be advised that RX refills may take up to 3 business days. We ask that you follow-up with your pharmacy. ?

## 2021-10-08 NOTE — Telephone Encounter (Signed)
?  Chief Complaint: upper abdominal pain ?Symptoms: pain mostly after eating-but occurs at other times too, starts as mild- builds in intensity to severe and then stops ?Frequency: 1-2 months- last occurrence-this morning ?Pertinent Negatives: Patient denies back pain, diarrhea, fever, urination pain, vomiting ?Disposition: [] ED /[] Urgent Care (no appt availability in office) / [x] Appointment(In office/virtual)/ []  [facility]/ [] Home Care/ [] Refused Recommended Disposition /[] [facility]/ []  Follow-up with PCP ?Additional Notes:  Patient advised keep appointment today  ? ?Reason for Disposition ? Age > 60 years ? ?Answer Assessment - Initial Assessment Questions ?1. LOCATION: "Where does it hurt?"  ?    Upper abdomen- midline pain ?2. RADIATION: "Does the pain shoot anywhere else?" (e.g., chest, back) ?    No radiation ?3. ONSET: "When did the pain begin?" (Minutes, hours or days ago)  ?    1-2 months ?4. SUDDEN: "Gradual or sudden onset?" ?    sudden ?5. PATTERN "Does the pain come and go, or is it constant?" ?   - If constant: "Is it getting better, staying the same, or worsening?"  ?    (Note: Constant means the pain never goes away completely; most serious pain is constant and it progresses)  ?   - If intermittent: "How long does it last?" "Do you have pain now?" ?    (Note: Intermittent means the pain goes away completely between bouts) ?    Comes and goes- usually after eating ?6. SEVERITY: "How bad is the pain?"  (e.g., Scale 1-10; mild, moderate, or severe) ?   - MILD (1-3): doesn't interfere with normal activities, abdomen soft and not tender to touch  ?   - MODERATE (4-7): interferes with normal activities or awakens from sleep, abdomen tender to touch  ?   - SEVERE (8-10): excruciating pain, doubled over, unable to do any normal activities   ?    This morning- starts at 3-ends at 10 ?7. RECURRENT SYMPTOM: "Have you ever had this type of stomach pain before?" If Yes, ask: "When was  the last time?" and "What happened that time?"  ?    no ?8. CAUSE: "What do you think is causing the stomach pain?" ?    Mostly after eating- but occurs when not eating too ?9. RELIEVING/AGGRAVATING FACTORS: "What makes it better or worse?" (e.g., movement, antacids, bowel movement) ?    No- pepto bismol tried ?10. OTHER SYMPTOMS: "Do you have any other symptoms?" (e.g., back pain, diarrhea, fever, urination pain, vomiting) ?      no ? ?Protocols used: Abdominal Pain - Male-A-AH ? ?

## 2021-10-08 NOTE — Progress Notes (Signed)
Patient has not eaten today only had OJ this morning. ?Patient denies pain at this time ?Patient reports increase in cigarettes due to his job. ? ?

## 2021-10-08 NOTE — Telephone Encounter (Signed)
Pt wife stated pt has abdominal pain for a while now.  ?Pt was not with wife.  ? ?Scheduled an appointment for today at 2:50 CHW with Freeman Caldron.  ? ?Seeking clinical advice  ?  ?      Left message to call back about symptoms. ?

## 2021-10-09 ENCOUNTER — Other Ambulatory Visit: Payer: Self-pay

## 2021-10-09 LAB — COMPREHENSIVE METABOLIC PANEL
ALT: 13 IU/L (ref 0–44)
AST: 21 IU/L (ref 0–40)
Albumin/Globulin Ratio: 1.7 (ref 1.2–2.2)
Albumin: 4.7 g/dL (ref 3.8–4.9)
Alkaline Phosphatase: 80 IU/L (ref 44–121)
BUN/Creatinine Ratio: 11 (ref 10–24)
BUN: 16 mg/dL (ref 8–27)
Bilirubin Total: 0.7 mg/dL (ref 0.0–1.2)
CO2: 25 mmol/L (ref 20–29)
Calcium: 9.9 mg/dL (ref 8.6–10.2)
Chloride: 101 mmol/L (ref 96–106)
Creatinine, Ser: 1.42 mg/dL — ABNORMAL HIGH (ref 0.76–1.27)
Globulin, Total: 2.8 g/dL (ref 1.5–4.5)
Glucose: 81 mg/dL (ref 70–99)
Potassium: 4 mmol/L (ref 3.5–5.2)
Sodium: 141 mmol/L (ref 134–144)
Total Protein: 7.5 g/dL (ref 6.0–8.5)
eGFR: 57 mL/min/{1.73_m2} — ABNORMAL LOW (ref >59)

## 2021-10-09 LAB — LIPASE: Lipase: 32 U/L (ref 13–78)

## 2021-10-09 LAB — CBC WITH DIFFERENTIAL/PLATELET
Basophils Absolute: 0 10*3/uL (ref 0.0–0.2)
Basos: 1 %
EOS (ABSOLUTE): 0.2 10*3/uL (ref 0.0–0.4)
Eos: 3 %
Hematocrit: 37.7 % (ref 37.5–51.0)
Hemoglobin: 12.7 g/dL — ABNORMAL LOW (ref 13.0–17.7)
Immature Grans (Abs): 0 10*3/uL (ref 0.0–0.1)
Immature Granulocytes: 0 %
Lymphocytes Absolute: 2.1 10*3/uL (ref 0.7–3.1)
Lymphs: 34 %
MCH: 29.9 pg (ref 26.6–33.0)
MCHC: 33.7 g/dL (ref 31.5–35.7)
MCV: 89 fL (ref 79–97)
Monocytes Absolute: 0.4 10*3/uL (ref 0.1–0.9)
Monocytes: 6 %
Neutrophils Absolute: 3.3 10*3/uL (ref 1.4–7.0)
Neutrophils: 56 %
Platelets: 309 10*3/uL (ref 150–450)
RBC: 4.25 x10E6/uL (ref 4.14–5.80)
RDW: 10.9 % — ABNORMAL LOW (ref 11.6–15.4)
WBC: 6 10*3/uL (ref 3.4–10.8)

## 2021-10-09 LAB — H. PYLORI BREATH TEST: H pylori Breath Test: NEGATIVE

## 2021-10-09 NOTE — Telephone Encounter (Signed)
Requested medication (s) are due for refill today: no ? ?Requested medication (s) are on the active medication list: yes ? ?Last refill:  10/08/21 ? ?Future visit scheduled: no ? ?Notes to clinic:  Unable to refill per protocol, last refill by provider 11/13/81, this is a duplicate request. ? ? ?  ?Requested Prescriptions  ?Pending Prescriptions Disp Refills  ? omeprazole (PRILOSEC) 20 MG capsule 30 capsule 1  ?  Sig: Take 1 capsule (20 mg total) by mouth daily.  ?  ? Gastroenterology: Proton Pump Inhibitors Passed - 10/08/2021  4:30 PM  ?  ?  Passed - Valid encounter within last 12 months  ?  Recent Outpatient Visits   ? ?      ? Yesterday Epigastric pain  ? [place], [place], [place]  ? 8 months ago Essential hypertension  ? [place] Elsie Stain, [place]  ? 10 months ago Essential hypertension  ? [place] Elsie Stain, [place]  ? 1 year ago Right-sided low back pain with right-sided sciatica, unspecified chronicity  ? [place], [place], [place]  ? 1 year ago Essential hypertension  ? [place], [place]  ? ?  ?  ? ? ?  ?  ?  ? losartan-hydrochlorothiazide (HYZAAR) 100-25 MG tablet 90 tablet 1  ?  Sig: TAKE 1 TABLET BY MOUTH DAILY.  ?  ? Cardiovascular: ARB + Diuretic Combos Failed - 10/08/2021  4:30 PM  ?  ?  Failed - K in normal range and within 180 days  ?  Potassium  ?Date Value Ref Range Status  ?10/08/2021 4.0 3.5 - 5.2 mmol/L Final  ?   ?  ?  Failed - Na in normal range and within 180 days  ?  Sodium  ?Date Value Ref Range Status  ?10/08/2021 141 134 - 144 mmol/L Final  ?   ?  ?  Failed - Cr in normal range and within 180 days  ?  Creat  ?Date Value Ref Range Status  ?03/09/2016 1.33 0.70 - 1.33 mg/dL Final  ?  Comment:  ?    ?For patients > or = 60 years of age: The upper reference limit for ?Creatinine is approximately 13%  higher for people identified as ?African-American. ?  ?  ? ?Creatinine, Ser  ?Date Value Ref Range Status  ?10/08/2021 1.42 (H) 0.76 - 1.27 mg/dL Final  ?   ?  ?  Failed - eGFR is 10 or above and within 180 days  ?  GFR calc Af Amer  ?Date Value Ref Range Status  ?05/07/2020 69 >59 mL/min/1.73 Final  ?  Comment:  ?  **In accordance with recommendations from the NKF-ASN Task force,** ?  [facility] is in the process of updating its eGFR calculation to the ?  2021 CKD-EPI creatinine equation that estimates kidney function ?  without a race variable. ?  ? ?GFR, Estimated  ?Date Value Ref Range Status  ?03/16/2021 >60 >60 mL/min Final  ?  Comment:  ?  (NOTE) ?Calculated using the CKD-EPI Creatinine Equation (2021) ?  ? ?GFR  ?Date Value Ref Range Status  ?03/06/2019 73.27 >60.00 mL/min Final  ? ?eGFR  ?Date Value Ref Range Status  ?10/08/2021 57 (L) >59 mL/min/1.73 Final  ?   ?  ?  Passed - Patient is not pregnant  ?  ?  Passed - Last BP in normal range  ?  BP Readings from Last 1 Encounters:  ?10/08/21 139/85  ?   ?  ?  Passed - Valid encounter within last 6 months  ?  Recent Outpatient Visits   ? ?      ? Yesterday Epigastric pain  ? [place], [place], [place]  ? 8 months ago Essential hypertension  ? [place] Elsie Stain, [place]  ? 10 months ago Essential hypertension  ? [place] Elsie Stain, [place]  ? 1 year ago Right-sided low back pain with right-sided sciatica, unspecified chronicity  ? [place], [place], [place]  ? 1 year ago Essential hypertension  ? [place], [place]  ? ?  ?  ? ? ?  ?  ?  ? clopidogrel (PLAVIX) 75 MG tablet 90 tablet 3  ?  Sig: TAKE 1 TABLET (75 MG TOTAL) BY MOUTH DAILY.  ?  ? Hematology: Antiplatelets - clopidogrel Failed - 10/08/2021  4:30 PM  ?  ?  Failed - HCT in normal range and within 180 days  ?   Hematocrit  ?Date Value Ref Range Status  ?10/08/2021 37.7 37.5 - 51.0 % Final  ?   ?  ?  Failed - HGB in normal range and within 180 days  ?  Hemoglobin  ?Date Value Ref Range Status  ?10/08/2021 12.7 (L) 13.0 - 17.7 g/dL Final  ?   ?  ?  Failed - PLT in normal range and within 180 days  ?  Platelets  ?Date Value Ref Range Status  ?10/08/2021 309 150 - 450 x10E3/uL Final  ?   ?  ?  Passed - Cr in normal range and within 360 days  ?  Creat  ?Date Value Ref Range Status  ?03/09/2016 1.33 0.70 - 1.33 mg/dL Final  ?  Comment:  ?    ?For patients > or = 60 years of age: The upper reference limit for ?Creatinine is approximately 13% higher for people identified as ?African-American. ?  ?  ? ?Creatinine, Ser  ?Date Value Ref Range Status  ?10/08/2021 1.42 (H) 0.76 - 1.27 mg/dL Final  ?   ?  ?  Passed - Valid encounter within last 6 months  ?  Recent Outpatient Visits   ? ?      ? Yesterday Epigastric pain  ? [place], [place], [place]  ? 8 months ago Essential hypertension  ? [place] Elsie Stain, [place]  ? 10 months ago Essential hypertension  ? [place] Elsie Stain, [place]  ? 1 year ago Right-sided low back pain with right-sided sciatica, unspecified chronicity  ? [place], [place], [place]  ? 1 year ago Essential hypertension  ? [place], [place]  ? ?  ?  ? ? ?  ?  ?  ? ranolazine (RANEXA) 500 MG 12 hr tablet 60 tablet 0  ?  Sig: TAKE 1 TABLET (500 MG TOTAL) BY MOUTH 2 (TWO) TIMES DAILY.  ?  ? Not Delegated - Cardiovascular: Ranolazine Failed - 10/08/2021  4:30 PM  ?  ?  Failed - This refill cannot be delegated  ?  ?  Passed - K in normal range and within 360 days  ?  Potassium  ?Date Value Ref Range Status  ?10/08/2021 4.0 3.5 - 5.2 mmol/L Final  ?   ?  ?  Passed - Cr in normal range and within 360 days  ?  Creat  ?Date Value Ref  Range Status  ?03/09/2016 1.33 0.70 - 1.33 mg/dL Final  ?  Comment:  ?    ?For patients > or = 60 years of age: The upper reference limit for ?Creatinine is approximately 13% higher for people identified as ?African-American. ?  ?  ? ?Creatinine, Ser  ?Date Value Ref Range Status  ?10/08/2021 1.42 (H) 0.76 - 1.27 mg/dL Final  ?   ?  ?  Passed - Last BP in normal range  ?  BP Readings from Last 1 Encounters:  ?10/08/21 139/85  ?   ?  ?  Passed - Valid encounter within last 12 months  ?  Recent Outpatient Visits   ? ?      ? Yesterday Epigastric pain  ? [place], [place], [place]  ? 8 months ago Essential hypertension  ? [place] Elsie Stain, [place]  ? 10 months ago Essential hypertension  ? [place] Elsie Stain, [place]  ? 1 year ago Right-sided low back pain with right-sided sciatica, unspecified chronicity  ? [place], [place], [place]  ? 1 year ago Essential hypertension  ? [place], [place]  ? ?  ?  ? ? ?  ?  ?  ? rosuvastatin (CRESTOR) 40 MG tablet 90 tablet 0  ?  Sig: TAKE 1 TABLET (40 MG TOTAL) BY MOUTH DAILY.  ?  ? Cardiovascular:  Antilipid - Statins 2 Failed - 10/08/2021  4:30 PM  ?  ?  Failed - Lipid Panel in normal range within the last 12 months  ?  Cholesterol, Total  ?Date Value Ref Range Status  ?12/11/2020 95 (L) 100 - 199 mg/dL Final  ? ?LDL Chol Calc (NIH)  ?Date Value Ref Range Status  ?12/11/2020 36 0 - 99 mg/dL Final  ? ?HDL  ?Date Value Ref Range Status  ?12/11/2020 48 >39 mg/dL Final  ? ?Triglycerides  ?Date Value Ref Range Status  ?12/11/2020 41 0 - 149 mg/dL Final  ? ?  ?  ?  Passed - Cr in normal range and within 360 days  ?  Creat  ?Date Value Ref Range Status  ?03/09/2016 1.33 0.70 - 1.33 mg/dL Final  ?  Comment:  ?    ?For patients > or = 60 years of age: The upper reference limit for ?Creatinine is  approximately 13% higher for people identified as ?African-American. ?  ?  ? ?Creatinine, Ser  ?Date Value Ref Range Status  ?10/08/2021 1.42 (H) 0.76 - 1.27 mg/dL Final  ?   ?  ?  Passed - Patient is not p

## 2021-10-14 MED ORDER — CLOPIDOGREL BISULFATE 75 MG PO TABS: ORAL_TABLET | Freq: Every day | ORAL | 3 refills | Status: DC

## 2021-10-14 MED ORDER — LOSARTAN POTASSIUM-HCTZ 100-25 MG PO TABS: 1.0000 | ORAL_TABLET | Freq: Every day | ORAL | 0 refills | Status: DC

## 2021-10-14 MED ORDER — OMEPRAZOLE 20 MG PO CPDR: 20.0000 mg | DELAYED_RELEASE_CAPSULE | Freq: Every day | ORAL | 0 refills | Status: DC

## 2021-10-14 MED ORDER — ROSUVASTATIN CALCIUM 40 MG PO TABS: ORAL_TABLET | Freq: Every day | ORAL | 0 refills | Status: DC

## 2021-10-14 MED ORDER — RANOLAZINE ER 500 MG PO TB12: ORAL_TABLET | Freq: Two times a day (BID) | ORAL | 0 refills | Status: DC

## 2021-11-11 ENCOUNTER — Other Ambulatory Visit: Payer: Self-pay | Admitting: Critical Care Medicine

## 2021-11-11 DIAGNOSIS — I25119 Atherosclerotic heart disease of native coronary artery with unspecified angina pectoris: Secondary | ICD-10-CM

## 2021-11-11 DIAGNOSIS — E785 Hyperlipidemia, unspecified: Secondary | ICD-10-CM

## 2021-11-12 NOTE — Telephone Encounter (Signed)
Request too soon, filled 90 day supply less than 30 days ago. Requested Prescriptions  Pending Prescriptions Disp Refills  . rosuvastatin (CRESTOR) 40 MG tablet [Pharmacy Med Name: ROSUVASTATIN CALCIUM 40 MG TAB] 90 tablet 0    Sig: TAKE 1 TABLET BY MOUTH EVERY DAY     Cardiovascular:  Antilipid - Statins 2 Failed - 11/11/2021  8:58 PM      Failed - Cr in normal range and within 360 days    Creat  Date Value Ref Range Status  03/09/2016 1.33 0.70 - 1.33 mg/dL Final    Comment:      For patients > or = 60 years of age: The upper reference limit for Creatinine is approximately 13% higher for people identified as African-American.      Creatinine, Ser  Date Value Ref Range Status  10/08/2021 1.42 (H) 0.76 - 1.27 mg/dL Final         Failed - Lipid Panel in normal range within the last 12 months    Cholesterol, Total  Date Value Ref Range Status  12/11/2020 95 (L) 100 - 199 mg/dL Final   LDL Chol Calc (NIH)  Date Value Ref Range Status  12/11/2020 36 0 - 99 mg/dL Final   HDL  Date Value Ref Range Status  12/11/2020 48 >39 mg/dL Final   Triglycerides  Date Value Ref Range Status  12/11/2020 41 0 - 149 mg/dL Final         Passed - Patient is not pregnant      Passed - Valid encounter within last 12 months    Recent Outpatient Visits          1 month ago Epigastric pain   [facility] [place], [place], [place]   9 months ago Essential hypertension   [facility] Frisk, [place]   11 months ago Essential hypertension   [facility] Frisk, [place]   1 year ago Right-sided low back pain with right-sided sciatica, unspecified chronicity   [place] [facility] [place], [place], [place]   1 year ago Essential hypertension   [place] [address], [place], [place]

## 2021-11-20 ENCOUNTER — Telehealth: Payer: Self-pay | Admitting: Pharmacist

## 2021-11-20 NOTE — Telephone Encounter (Signed)
Received request for Praluent PA. This has been submitted and approved through 11/21/22.

## 2021-12-24 ENCOUNTER — Other Ambulatory Visit: Payer: Self-pay | Admitting: Interventional Cardiology

## 2021-12-25 ENCOUNTER — Other Ambulatory Visit: Payer: Self-pay | Admitting: Interventional Cardiology

## 2021-12-25 ENCOUNTER — Telehealth: Payer: Self-pay | Admitting: Pharmacist

## 2021-12-25 MED ORDER — PRALUENT 150 MG/ML ~~LOC~~ SOAJ: 150.0000 mg | SUBCUTANEOUS | 11 refills | Status: DC

## 2021-12-25 NOTE — Telephone Encounter (Signed)
Received notification from pt's pharmacy that Praluent is no longer covered and now prefers Repatha again. He just changed from Repatha to Praluent last year because of his insurance formulary change. Called pt and advised him that his insurance changed their formulary again and now requires him to change back to Repatha. Rx sent to pharmacy. Provided pt with # to Repatha Ready in case he needs copay card info again for his rx. He was appreciative for the call. 

## 2021-12-25 NOTE — Telephone Encounter (Signed)
Received notification from pt's pharmacy that Praluent is no longer covered and now prefers Repatha again. He just changed from Repatha to Praluent last year because of his insurance formulary change. Called pt and advised him that his insurance changed their formulary again and now requires him to change back to Repatha. Rx sent to pharmacy. Provided pt with # to Repatha Ready in case he needs copay card info again for his rx. He was appreciative for the call.

## 2022-01-26 ENCOUNTER — Ambulatory Visit: Payer: Self-pay | Admitting: *Deleted

## 2022-01-26 NOTE — Telephone Encounter (Signed)
   Chief Complaint: unintentional weight loss, bone spur on right shoulder Symptoms: ibid Frequency: Ongoing Pertinent Negatives: Patient denies Vomiting diarrhea. Disposition: [] ED /[x] Urgent Care (no appt availability in office) / [] Appointment(In office/virtual)/ []  [facility]/ [] Home Care/ [] Refused Recommended Disposition /[]  Mobile Bus/ []  Follow-up with PCP Additional Notes: Pt reports about 15 lb weight loss this summer. Pt states he usually loses weight in the summer about 7 lbs. So this is double the usually amount. Pt reports no other S/S.  Pt also reports a bone spur on his rt shoulder. Usually the spur comes and goes.  Spur has been up for awhile now .  Pt will go to UC now and follow up at September appt. .   Summary: Swelling in shoulder, weight loss   Pt cant make appt today bc of work   ----- Message from sent at 01/26/2022  1:59 PM EDT -----  Swelling in shoulder, and weight loss. Questions for nurse, no appt available soon enough   Best contact: [phone]      Reason for Disposition  MODERATE unexplained weight loss (e.g., 5%, 8 to 10 pounds [4 - 5 kg] in person who weighs 170 to 200 pounds [75 - 90 kg])  Answer Assessment - Initial Assessment Questions 1. MAIN CONCERN: "What is your main concern today?"     Unintentional weight loss 2. WEIGHT LOSS: "How much weight have you lost?"  (e.g., lbs., kgs.)  "Over what period of time have you lost this weight?"  (e.g., number of days, weeks, months, years)     15 lbs 3. BASELINE WEIGHT: "What is your baseline or normal weight?" (e.g., "How much do you usually weigh?")     167-171 4. CAUSE: "What do you think is causing the weight loss?" (e.g., depression, anxiety, medicine side effect, pain, trouble swallowing, substance or alcohol use problem, eating disorder)     unsure 5. PRIOR EVALUATION: "Have you been evaluated by a doctor for your weight loss?" If Yes, ask "When was  your last visit?" "What did your doctor (or NP/PA) tell you about the possible cause?"     no 6. HEART FAILURE TREATMENT: "Do you have heart failure?" If Yes, ask: "Have you taken new or extra water pills (diuretics) recently?" (e.g., furosemide; bumetanide). "What is your target weight?"     Heart disease 7. OTHER SYMPTOMS: "Do you have any other symptoms?" (e.g., anxiety or depression, blood in stool, breathing difficulty, diarrhea, fever, trouble swallowing)     No - feels tired 8. PREGNANCY: "Is there any chance you are pregnant?" "When was your last menstrual period?"     na  Protocols used: Weight Loss - Unintended-A-AH

## 2022-01-26 NOTE — Telephone Encounter (Signed)
Pt cant make appt today bc of work   ----- Message from Randol Kern sent at 01/26/2022  1:59 PM EDT -----  Swelling in shoulder, and weight loss. Questions for nurse, no appt available soon enough   Best contact: [phone]      Pt answered first call, disconnected. Called pt back, left VM to call back.

## 2022-01-26 NOTE — Telephone Encounter (Signed)
Patient was able to get sooner appt.  02/08/2022 at 1410.

## 2022-02-07 NOTE — Progress Notes (Incomplete)
   Established Patient Office Visit  Subjective   Patient ID: [name], male    DOB: 03-Mar-1961  Age: 61 y.o. MRN: [id number]  No chief complaint on file.   HPI  {History (Optional):23778}  ROS    Objective:     There were no vitals taken for this visit. {Vitals History (Optional):23777}  Physical Exam   No results found for any visits on 02/08/22.  {Labs (Optional):23779}  The ASCVD Risk score (Arnett DK, et al., 2019) failed to calculate for the following reasons:   The patient has a prior MI or stroke diagnosis    Assessment & Plan:   Problem List Items Addressed This Visit   None   No follow-ups on file.    Shan Levans, MD

## 2022-02-08 ENCOUNTER — Other Ambulatory Visit: Payer: Self-pay | Admitting: Critical Care Medicine

## 2022-02-08 ENCOUNTER — Other Ambulatory Visit: Payer: Self-pay

## 2022-02-08 ENCOUNTER — Encounter: Payer: Self-pay | Admitting: Critical Care Medicine

## 2022-02-08 ENCOUNTER — Ambulatory Visit: Payer: 59 | Attending: Critical Care Medicine | Admitting: Critical Care Medicine

## 2022-02-08 VITALS — BP 136/78 | HR 78 | Ht 68.0 in | Wt 164.8 lb

## 2022-02-08 DIAGNOSIS — I10 Essential (primary) hypertension: Secondary | ICD-10-CM | POA: Diagnosis not present

## 2022-02-08 DIAGNOSIS — M19011 Primary osteoarthritis, right shoulder: Secondary | ICD-10-CM

## 2022-02-08 DIAGNOSIS — E785 Hyperlipidemia, unspecified: Secondary | ICD-10-CM | POA: Diagnosis not present

## 2022-02-08 DIAGNOSIS — K219 Gastro-esophageal reflux disease without esophagitis: Secondary | ICD-10-CM

## 2022-02-08 DIAGNOSIS — M25511 Pain in right shoulder: Secondary | ICD-10-CM | POA: Diagnosis not present

## 2022-02-08 DIAGNOSIS — I422 Other hypertrophic cardiomyopathy: Secondary | ICD-10-CM

## 2022-02-08 DIAGNOSIS — I25119 Atherosclerotic heart disease of native coronary artery with unspecified angina pectoris: Secondary | ICD-10-CM | POA: Diagnosis not present

## 2022-02-08 DIAGNOSIS — G8929 Other chronic pain: Secondary | ICD-10-CM

## 2022-02-08 DIAGNOSIS — N182 Chronic kidney disease, stage 2 (mild): Secondary | ICD-10-CM

## 2022-02-08 DIAGNOSIS — F17200 Nicotine dependence, unspecified, uncomplicated: Secondary | ICD-10-CM

## 2022-02-08 MED ORDER — AMLODIPINE BESYLATE 10 MG PO TABS
10.0000 mg | ORAL_TABLET | Freq: Every day | ORAL | 1 refills | Status: DC
  Filled 2022-02-08: qty 30, 30d supply, fill #0

## 2022-02-08 MED ORDER — LOSARTAN POTASSIUM-HCTZ 100-25 MG PO TABS
1.0000 | ORAL_TABLET | Freq: Every day | ORAL | 2 refills | Status: DC
  Filled 2022-02-08: qty 30, 30d supply, fill #0

## 2022-02-08 MED ORDER — ROSUVASTATIN CALCIUM 40 MG PO TABS
ORAL_TABLET | Freq: Every day | ORAL | 2 refills | Status: DC
  Filled 2022-02-08: qty 90, fill #0

## 2022-02-08 MED ORDER — RANOLAZINE ER 500 MG PO TB12
ORAL_TABLET | Freq: Two times a day (BID) | ORAL | 1 refills | Status: DC
  Filled 2022-02-08: qty 180, fill #0

## 2022-02-08 MED ORDER — CLOPIDOGREL BISULFATE 75 MG PO TABS
ORAL_TABLET | Freq: Every day | ORAL | 3 refills | Status: DC
  Filled 2022-02-08: qty 30, 30d supply, fill #0

## 2022-02-08 NOTE — Patient Instructions (Signed)
Start amlodipine 1 pill daily and stay on other blood pressure medications as prescribed  All medication refills were sent to our pharmacy downstairs  Stop smoking as we discussed use the nicotine lozenge and see attachment  You declined the flu vaccine  Return to see Dr. Delford Field in 3 months  See Franky Macho our clinical pharmacist in 3 weeks for recheck on blood pressure

## 2022-02-08 NOTE — Assessment & Plan Note (Signed)
Continue proton pump inhibitor

## 2022-02-08 NOTE — Assessment & Plan Note (Signed)
Need to reassess renal function

## 2022-02-08 NOTE — Assessment & Plan Note (Signed)
Cholesterol at goal

## 2022-02-08 NOTE — Assessment & Plan Note (Signed)
Was recommended an MRI but cannot afford the co-pay

## 2022-02-08 NOTE — Assessment & Plan Note (Signed)
Continue high-dose aspirin and statin therapy

## 2022-02-08 NOTE — Assessment & Plan Note (Signed)
  .   Current smoking consumption amount: 1 pack a day  . Dicsussion on advise to quit smoking and smoking impacts: Cardiovascular impacts  . Patient's willingness to quit: Ready to quit  Methods to quit smoking discussed: Nicotine replacement . Medication management of smoking session drugs discussed: Nicotine replacement  . Resources provided:  AVS   . Setting quit date 10 days  . Follow-up arranged 1 month   Time spent counseling the patient: 5 minutes

## 2022-02-08 NOTE — Assessment & Plan Note (Signed)
Hypertension not well controlled plan to will be to add amlodipine 10 mg daily see the patient back with clinical pharmacy short-term

## 2022-02-08 NOTE — Assessment & Plan Note (Signed)
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

## 2022-02-12 ENCOUNTER — Ambulatory Visit: Payer: 59 | Admitting: Sports Medicine

## 2022-02-12 ENCOUNTER — Encounter: Payer: Self-pay | Admitting: Sports Medicine

## 2022-02-12 ENCOUNTER — Ambulatory Visit (INDEPENDENT_AMBULATORY_CARE_PROVIDER_SITE_OTHER): Payer: 59

## 2022-02-12 VITALS — BP 154/80 | HR 62 | Ht 68.0 in | Wt 165.0 lb

## 2022-02-12 DIAGNOSIS — G8929 Other chronic pain: Secondary | ICD-10-CM

## 2022-02-12 DIAGNOSIS — M19011 Primary osteoarthritis, right shoulder: Secondary | ICD-10-CM

## 2022-02-12 DIAGNOSIS — M25511 Pain in right shoulder: Secondary | ICD-10-CM | POA: Diagnosis not present

## 2022-02-12 DIAGNOSIS — I252 Old myocardial infarction: Secondary | ICD-10-CM | POA: Diagnosis not present

## 2022-02-12 NOTE — Progress Notes (Signed)
[name] - 61 y.o. male MRN [id number]  Date of birth: 1961-03-20  Office Visit Note: Visit Date: 02/12/2022 PCP: [name] Frisk, MD Referred by: [name] Frisk, MD  Subjective: Chief Complaint  Patient presents with   Right Shoulder - Pain   HPI: [name] is a pleasant 61 y.o. male who presents today for chronic right shoulder pain.  The pain has been bothering him for about the last 6 months.  Recently over the last few weeks it has been exacerbated, as he has difficulty at night and sometimes wakes up because of the pain. Has to rotate to a different position and attempt to get comfortable.  His pain is worse with lifting the arm above the head or any overhead work.  He works as a Nutritional therapist and when he is underneath the sink reaching above his head he will get reproduction of sharp pain over the lateral shoulder that radiates into the deltoid.  Occasionally the pain will run down past the elbow, but this is to a lesser extent.  He denies any weakness.  The pain is affecting his work as a Nutritional therapist.  Pertinent ROS were reviewed with the patient and found to be negative unless otherwise specified above in HPI.   Assessment & Plan: Visit Diagnoses:  1. Chronic right shoulder pain   2. Acute pain of right shoulder   3. Primary osteoarthritis, right shoulder   4. MYOCARDIAL INFARCTION, HX OF    Plan: Had a discussion with [name] today regarding his shoulder pain, he does have rather significant arthritis over the inferior lateral aspect of the acromial process and the greater tuberosity humeral head, indicative of likely bony impingement with his repetitive overhead activity.  We walk-through all treatment options such as oral NSAID therapy, PT versus home rehab, corticosteroid injection.  Given that he has a history of MI, we discussed not taking any consistent NSAID.  His pain has been exacerbated enough that is now affecting his sleep and work, so we decided to proceed  with subacromial joint injection, patient tolerated well.  He may use ice and/or Tylenol for any postinjection pain.  We did give him a home exercise program to work on range of motion and rotator cuff exercises.  Discussed with him more so maintaining range of motion and gliding activity as opposed to true strengthening of the rotator cuff.  He will follow-up in 4 weeks for reevaluation.  If for some reason this does not give him benefit, we can always consider an alternative injection location of the shoulder, he also may benefit from possible surgical removal of bony spurs if fails conservative treatment.  Follow-up: Return in about 4 weeks (around 03/12/2022).   Meds & Orders: No orders of the defined types were placed in this encounter.   Orders Placed This Encounter  Procedures   XR Shoulder Right     Procedures: Subacromial Joint Injection, Right Shoulder After discussion on risks/benefits/indications, informed verbal consent was obtained. A timeout was then performed. Patient was seated on table in exam room. The patient's shoulder was prepped with betadine and alcohol swabs and utilizing posterior approach a 22G, 1.5" needle was directed anteriorly and laterally into the patient's subacromial space was injected with 2:2:1 mixture of lidocaine:bupivicaine:depomedrol with appreciation of free-flowing of the injectate into the bursal space. Patient tolerated the procedure well without immediate complications.        Clinical History: No specialty comments available.  He reports that he has been smoking cigarettes.  He has been smoking an average of 1.5 packs per day. He has never used smokeless tobacco. No results for input(s): "HGBA1C", "LABURIC" in the last 8760 hours.  Objective:   Vital Signs: BP (!) 154/80   Pulse 62   Ht 5\' 8"  (1.727 m)   Wt 165 lb (74.8 kg)   BMI 25.09 kg/m   Physical Exam  Gen: Well-appearing, in no acute distress; non-toxic CV: Regular Rate.  Well-perfused. Warm.  Resp: Breathing unlabored on room air; no wheezing. Psych: Fluid speech in conversation; appropriate affect; normal thought process Neuro: Sensation intact throughout. No gross coordination deficits.   Ortho Exam -Right shoulder: Evaluation of the right shoulder demonstrates no erythema or effusion.  There is some TTP over the lateral aspect of the acromion process and the proximal deltoid.  No true AC joint TTP.  Active range of motion with forward flexion to 160 degrees, compared to 170 degrees of the contralateral shoulder; abduction is limited as he then proceeds with scapular compensation . There is full internal and external range of motion although pain with endrange internal.  Rotator cuff testing has great strength.  No true internal impingement.  There is some pain with Neer's testing.  Peripheral pulses intact.  Imaging: XR Shoulder Right  Result Date: 02/12/2022 4 views of the right shoulder including AP, Grashey, axial and scapular Y views were ordered and reviewed by myself.  X-rays show no acute fracture.  There is quite significant OA with bony spurring and sclerosis of the distal and inferior aspect of the acromion process.  There is bony spurring and sclerosis of the greater tuberosity of the humeral head.  This bony pattern is very indicative of impingement-like process.  Some soup or migration of the humeral head.  Moderate AC joint arthritis.  Relatively well joint space of the glenohumeral joint, mild OA.   Past Medical/Family/Surgical/Social History: Medications & Allergies reviewed per EMR, new medications updated. 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   Spinal stenosis at L4-L5 level 09/22/2016   AV block    Thiamine deficiency 11/19/2015   Depression with  somatization 11/19/2015   Primary osteoarthritis of first carpometacarpal joint of left hand 10/23/2015   Chronic renal insufficiency, stage II (mild) 07/02/2014   Coronary artery disease involving native coronary artery of native heart with angina pectoris (HCC)    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/2013   Migraine, unspecified, without mention of intractable migraine without mention of status migrainosus 03/26/2013   GERD (gastroesophageal reflux disease) 11/14/2012   Routine general medical examination at a health care facility 07/28/2012   ERECTILE DYSFUNCTION 08/12/2010   Hyperlipidemia with target LDL less than 70 10/15/2009   TOBACCO USE 10/15/2009   Essential hypertension 10/15/2009   MYOCARDIAL INFARCTION, HX OF 10/15/2009   Past Medical History:  Diagnosis Date   Apical variant hypertrophic cardiomyopathy (HCC)    CAD (coronary artery disease)    a. LHC in 2011 with severe dz in D1/D2 and very distal LAD--> too small for PCI and managed medically.  b.  LHC in 10/2013 after an abnormal nuclear study w/ non-obst dz/c. 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   HOH (hard of hearing)    rgiht ear   Hyperlipidemia    Hypertension    Migraine headache    Stroke [facility])    Family History  Problem Relation Age of Onset   Hypertension Mother    Heart failure Mother    Hypertension Father    Colon cancer Father 89   Colon cancer Paternal Uncle    Past Surgical History:  Procedure Laterality Date   CARDIAC CATHETERIZATION N/A 02/24/2016   Procedure: Left Heart Cath and Coronary Angiography;  Surgeon: Tonny Bollman, MD;  Location: [facility]  INVASIVE CV LAB;  Service: Cardiovascular;  Laterality: N/A;   CARDIAC CATHETERIZATION N/A 02/24/2016   Procedure: Coronary Stent Intervention;  Surgeon: Tonny Bollman, MD;  Location: [facility] INVASIVE CV LAB;  Service: Cardiovascular;  Laterality: N/A;   CORONARY ANGIOPLASTY WITH STENT PLACEMENT  2017   CORONARY BALLOON ANGIOPLASTY N/A 04/30/2019   Procedure: CORONARY BALLOON ANGIOPLASTY;  Surgeon: Swaziland, Peter M, MD;  Location: [facility] INVASIVE CV LAB;  Service: Cardiovascular;  Laterality: N/A;   INGUINAL HERNIA REPAIR     LEFT HEART CATH AND CORONARY ANGIOGRAPHY N/A 04/30/2019   Procedure: LEFT HEART CATH AND CORONARY ANGIOGRAPHY;  Surgeon: Dolores Patty, MD;  Location: [facility] INVASIVE CV LAB;  Service: Cardiovascular;  Laterality: N/A;   LEFT HEART CATH AND CORONARY ANGIOGRAPHY N/A 05/14/2019   Procedure: LEFT HEART CATH AND CORONARY ANGIOGRAPHY;  Surgeon: Swaziland, Peter M, MD;  Location: [facility] INVASIVE CV LAB;  Service: Cardiovascular;  Laterality: N/A;   LEFT HEART CATHETERIZATION WITH CORONARY ANGIOGRAM N/A 11/22/2013   Procedure: LEFT HEART CATHETERIZATION WITH CORONARY ANGIOGRAM;  Surgeon: [name], MD;  Location: [facility] CATH LAB;  Service: Cardiovascular;  Laterality: N/A;   TEE WITHOUT CARDIOVERSION N/A 08/22/2015   Procedure: TRANSESOPHAGEAL ECHOCARDIOGRAM (TEE);  Surgeon: Vesta Mixer, MD;  Location: [facility] ENDOSCOPY;  Service: Cardiovascular;  Laterality: N/A;   Social History   Occupational History   Not on file  Tobacco Use   Smoking status: Some Days    Packs/day: 1.50    Types: Cigarettes   Smokeless tobacco: Never  Vaping Use   Vaping Use: Never used  Substance and Sexual Activity   Alcohol use: No    Alcohol/week: 0.0 standard drinks of alcohol   Drug use: No   Sexual activity: Not Currently

## 2022-02-12 NOTE — Progress Notes (Signed)
Pain in right shoulder; 6 months of pain. Tylenol for pain. No injury that he recalls. He does complain of radicular pain into hand/fingers.

## 2022-02-15 ENCOUNTER — Other Ambulatory Visit: Payer: Self-pay

## 2022-02-19 ENCOUNTER — Ambulatory Visit: Payer: 59 | Admitting: Nurse Practitioner

## 2022-03-06 ENCOUNTER — Other Ambulatory Visit: Payer: Self-pay | Admitting: Critical Care Medicine

## 2022-03-06 DIAGNOSIS — I10 Essential (primary) hypertension: Secondary | ICD-10-CM

## 2022-03-08 ENCOUNTER — Other Ambulatory Visit: Payer: Self-pay

## 2022-03-12 ENCOUNTER — Encounter: Payer: Self-pay | Admitting: Pharmacist

## 2022-03-12 ENCOUNTER — Ambulatory Visit: Payer: 59 | Attending: Internal Medicine | Admitting: Pharmacist

## 2022-03-12 VITALS — BP 146/77 | HR 53

## 2022-03-12 DIAGNOSIS — I10 Essential (primary) hypertension: Secondary | ICD-10-CM

## 2022-03-12 MED ORDER — NICOTINE POLACRILEX 4 MG MT LOZG: LOZENGE | OROMUCOSAL | 4 refills | Status: DC

## 2022-03-12 NOTE — Progress Notes (Signed)
   S:     No chief complaint on file.  [name] is a 61 y.o. male who presents for hypertension evaluation, education, and management.  PMH is significant for HTN, CAD S/p cutting balloon angioplasty of the ramus intermedius on 04/30/2019, GERD, HLD, HTN, tobacco use, prior CVA, migraines.  Patient was referred and last seen by Primary Care Provider, Dr. Joya Gaskins, on 02/08/2022. BP was 136/78 mmHg at that appt. Amlodipine was started.    Today, patient arrives in good spirits and presents without assistance. Denies dizziness, headache, blurred vision, swelling.   Patient reports hypertension is longstanding.   Family/Social history:  -Fhx: HTN, CHF -Tobacco: current everyday smoker -Alcohol: none reported   Medication adherence reported. Patient takes his BP medications at night.   Current antihypertensives include: amlodipine 10 mg daily, losartan-HCTZ 100-25 mg daily **takes ranolazine 500 mg BID. Cannot tolerate beta-blocker therapy.   Antihypertensives tried in the past include: hydralazine (HA), single agent HCTZ, olmesartan, irbesartan, labetalol (history of bradycardia)  Reported home BP readings: none recently  Patient reported dietary habits: -Tries to limit sodium -Does admit to drinking excessive amounts of coffee but plans to decrease this as he can.   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   ASCVD risk factors include: none  O:  Vitals:   03/12/22 1356  BP: (!) 146/77  Pulse: (!) 53    Last 3 Office BP readings: BP Readings from Last 3 Encounters:  03/12/22 (!) 146/77  02/12/22 (!) 154/80  02/08/22 136/78    BMET    Component Value Date/Time   NA 141 10/08/2021 1525   K 4.0 10/08/2021 1525   CL 101 10/08/2021 1525   CO2 25 10/08/2021 1525   GLUCOSE 81 10/08/2021 1525   GLUCOSE 81 03/16/2021 1406   BUN 16 10/08/2021 1525   CREATININE 1.42 (H) 10/08/2021 1525   CREATININE 1.33 03/09/2016  1032   CALCIUM 9.9 10/08/2021 1525   GFRNONAA >60 03/16/2021 1406   GFRAA 69 05/07/2020 0904    Renal function: CrCl cannot be calculated (Patient's most recent lab result is older than the maximum 21 days allowed.).  Clinical ASCVD: Yes  The ASCVD Risk score (Arnett DK, et al., 2019) failed to calculate for the following reasons:   The patient has a prior MI or stroke diagnosis  A/P: Hypertension diagnosed currently above goal on current medications. BP goal < 130/80 mmHg. Medication adherence appears appropriate. He plans to restart NRT using the lozenges and wants to cut back on caffeine consumption. His BP is higher today than it was with Dr. Joya Gaskins before initiation of amlodipine. Pt will return in 1 month for recheck.   -Continued current medications for now.  -Patient educated on purpose, proper use, and potential adverse effects of NRT/lozenges.  -F/u labs ordered - none today -Counseled on lifestyle modifications for blood pressure control including reduced dietary sodium, increased exercise, adequate sleep. -Encouraged patient to check BP at home and bring log of readings to next visit. Counseled on proper use of home BP cuff.    Results reviewed and written information provided.    Written patient instructions provided. Patient verbalized understanding of treatment plan.  Total time in face to face counseling 20 minutes.    Follow-up:  Pharmacist in 1 month.

## 2022-04-16 ENCOUNTER — Ambulatory Visit: Payer: 59 | Attending: Internal Medicine | Admitting: Pharmacist

## 2022-04-16 VITALS — BP 139/61 | HR 76

## 2022-04-16 DIAGNOSIS — I10 Essential (primary) hypertension: Secondary | ICD-10-CM

## 2022-04-16 NOTE — Progress Notes (Signed)
   S:     No chief complaint on file.  [name] is a 61 y.o. male who presents for hypertension evaluation, education, and management.  PMH is significant for HTN, CAD S/p cutting balloon angioplasty of the ramus intermedius on 04/30/2019, GERD, HLD, HTN, tobacco use, prior CVA, migraines.  Patient was referred and last seen by Primary Care Provider, Dr. Delford Field, on 02/08/2022. BP was 136/78 mmHg at that appt. Amlodipine was started. I saw him on 03/12/2022 and made no changes.   Today, patient arrives in good spirits and presents without assistance. Denies dizziness, headache, blurred vision, swelling.   Patient reports hypertension is longstanding.   Family/Social history:  -Fhx: HTN, CHF -Tobacco: current everyday smoker -Alcohol: none reported   Medication adherence reported. Patient takes his BP medications at night. Took yesterday evening.   Current antihypertensives include: amlodipine 10 mg daily, losartan-HCTZ 100-25 mg daily **takes ranolazine 500 mg BID. Cannot tolerate beta-blocker therapy.   Antihypertensives tried in the past include: hydralazine (HA), single agent HCTZ, olmesartan, irbesartan, labetalol (history of bradycardia)  Reported home BP readings: none recently  Patient reported dietary habits: -Tries to limit sodium -Does admit to drinking excessive amounts of coffee but plans to decrease this as he can.   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   ASCVD risk factors include: none  O:  Vitals:   04/16/22 1427  BP: 139/61  Pulse: 76     Last 3 Office BP readings: BP Readings from Last 3 Encounters:  04/16/22 139/61  03/12/22 (!) 146/77  02/12/22 (!) 154/80    BMET    Component Value Date/Time   NA 141 10/08/2021 1525   K 4.0 10/08/2021 1525   CL 101 10/08/2021 1525   CO2 25 10/08/2021 1525   GLUCOSE 81 10/08/2021 1525   GLUCOSE 81 03/16/2021 1406   BUN 16 10/08/2021 1525    CREATININE 1.42 (H) 10/08/2021 1525   CREATININE 1.33 03/09/2016 1032   CALCIUM 9.9 10/08/2021 1525   GFRNONAA >60 03/16/2021 1406   GFRAA 69 05/07/2020 0904    Renal function: CrCl cannot be calculated (Patient's most recent lab result is older than the maximum 21 days allowed.).  Clinical ASCVD: Yes  The ASCVD Risk score (Arnett DK, et al., 2019) failed to calculate for the following reasons:   The patient has a prior MI or stroke diagnosis  A/P: Hypertension diagnosed currently close to goal on current medications. BP goal < 130/80 mmHg. Medication adherence appears appropriate. -Continued current medications for now.  -Patient educated on purpose, proper use, and potential adverse effects of NRT/lozenges.  -F/u labs ordered - none today -Counseled on lifestyle modifications for blood pressure control including reduced dietary sodium, increased exercise, adequate sleep. -Encouraged patient to check BP at home and bring log of readings to next visit. Counseled on proper use of home BP cuff.    Results reviewed and written information provided.    Written patient instructions provided. Patient verbalized understanding of treatment plan.  Total time in face to face counseling 20 minutes.    Follow-up:  W/ PCP next month.  Butch Penny, PharmD, Patsy Baltimore, CPP Clinical Pharmacist [facility] [phone]

## 2022-04-19 ENCOUNTER — Ambulatory Visit (INDEPENDENT_AMBULATORY_CARE_PROVIDER_SITE_OTHER): Payer: 59

## 2022-04-19 ENCOUNTER — Ambulatory Visit: Payer: 59 | Admitting: Sports Medicine

## 2022-04-19 ENCOUNTER — Encounter: Payer: Self-pay | Admitting: Sports Medicine

## 2022-04-19 ENCOUNTER — Ambulatory Visit: Payer: Self-pay

## 2022-04-19 DIAGNOSIS — M4722 Other spondylosis with radiculopathy, cervical region: Secondary | ICD-10-CM

## 2022-04-19 DIAGNOSIS — M19012 Primary osteoarthritis, left shoulder: Secondary | ICD-10-CM

## 2022-04-19 DIAGNOSIS — M542 Cervicalgia: Secondary | ICD-10-CM

## 2022-04-19 DIAGNOSIS — M7522 Bicipital tendinitis, left shoulder: Secondary | ICD-10-CM

## 2022-04-19 DIAGNOSIS — M25512 Pain in left shoulder: Secondary | ICD-10-CM | POA: Diagnosis not present

## 2022-04-19 MED ORDER — METHYLPREDNISOLONE ACETATE 40 MG/ML IJ SUSP
80.0000 mg | INTRAMUSCULAR | Status: AC | PRN
  Administered 2022-04-19: 80 mg via INTRA_ARTICULAR

## 2022-04-19 MED ORDER — LIDOCAINE HCL 1 % IJ SOLN
2.0000 mL | INTRAMUSCULAR | Status: AC | PRN
  Administered 2022-04-19: 2 mL

## 2022-04-19 MED ORDER — BUPIVACAINE HCL 0.25 % IJ SOLN
2.0000 mL | INTRAMUSCULAR | Status: AC | PRN
  Administered 2022-04-19: 2 mL via INTRA_ARTICULAR

## 2022-04-19 NOTE — Progress Notes (Signed)
[name] - 61 y.o. male MRN [id number]  Date of birth: June 28, 1960  Office Visit Note: Visit Date: 04/19/2022 PCP: Storm Frisk, MD Referred by: Storm Frisk, MD  Subjective: Chief Complaint  Patient presents with   Left Shoulder - Pain   Neck - Pain   HPI: [name] is a pleasant 61 y.o. male who presents today for left shoulder pain and radiculopathy.  He previously saw me back on 02/12/2022 at which she had a right shoulder pain.  We did do a subacromial joint injection at that visit which got rid of his pain completely.  Over the last few weeks he has had some left shoulder pain.  Pain is over the anterior and lateral shoulder.  He is also having some numbness and tingling sensation that goes down the arm and into the third and fourth digit.  He did not have this numbness and tingling on the right shoulder in the past.  Denies any loss of grip strength or dropping objects.  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  Pertinent ROS were reviewed with the patient and found to be negative unless otherwise specified above in HPI.   Assessment & Plan: Visit Diagnoses:  1. Acute pain of left shoulder   2. Cervical pain   3. Glenohumeral arthritis, left   4. Biceps tendinitis of left shoulder   5. Osteoarthritis of spine with radiculopathy, cervical region    Plan: We discussed both understood his conditions today.  He is having left shoulder pain which I think is more so glenohumeral arthritic and some biceps tendinopathy.  Through shared decision making, elected to proceed with ultrasound-guided glenohumeral joint injection.  He had excellent relief from his pain about 10 minutes postinjection.  We will get him started on some range of motion and strengthening exercises for the shoulder and rotator cuff itself.  He is also reporting radicular symptoms going down the arm into the third and fourth finger.  His x-rays show some DJD  throughout the cervical spine.  I would like to see if this calms down after the injection with the shoulder exercises.  Discussed work modifications, proper posture for the neck and shoulders as well.  He will follow-up in 3 weeks.  If he is still having radicular symptoms, can always consider an MRI of the neck.  If for some reason he is still having shoulder pain, may consider subacromial joint injection.  I did okay very sparing use of ibuprofen, although instructed him to not use consistent NSAIDs given his history of MI.  Follow-up: Return in about 3 years (around 04/19/2025) for with Dr. Shon Baton for left shoulder pain and N/T.   Meds & Orders: No orders of the defined types were placed in this encounter.   Orders Placed This Encounter  Procedures   XR Cervical Spine 2 or 3 views   XR Shoulder Left   US Guided Needle Placement - No Linked Charges     Procedures: Large Joint Inj: L glenohumeral on 04/19/2022 3:59 PM Indications: pain Details: 22 G 3.5 in needle, ultrasound-guided posterior approach Medications: 2 mL lidocaine 1 %; 2 mL bupivacaine 0.25 %; 80 mg methylPREDNISolone acetate 40 MG/ML Outcome: tolerated well, no immediate complications  US-guided glenohumeral joint injection, left shoulder After discussion on risks/benefits/indications, informed verbal consent was obtained. A timeout was then performed. The patient was positioned lying lateral recumbent on examination table. The patient's shoulder was prepped with betadine  and multiple alcohol swabs and utilizing ultrasound guidance, the patient's glenohumeral joint was identified on ultrasound. Using ultrasound guidance a 22-gauge, 3.5 inch needle with a mixture of 2:2:2 cc's lidocaine:bupivicaine:depomedrol was directed from a lateral to medial direction via in-plane technique into the glenohumeral joint with visualization of appropriate spread of injectate into the joint. Patient tolerated the procedure well without immediate  complications.      Procedure, treatment alternatives, risks and benefits explained, specific risks discussed. Consent was given by the patient. Immediately prior to procedure a time out was called to verify the correct patient, procedure, equipment, support staff and site/side marked as required. Patient was prepped and draped in the usual sterile fashion.          Clinical History: No specialty comments available.  He reports that he has been smoking cigarettes. He has been smoking an average of 1.5 packs per day. He has never used smokeless tobacco. No results for input(s): "HGBA1C", "LABURIC" in the last 8760 hours.  Objective:    Physical Exam  Gen: Well-appearing, in no acute distress; non-toxic CV: Regular Rate. Well-perfused. Warm.  Resp: Breathing unlabored on room air; no wheezing. Psych: Fluid speech in conversation; appropriate affect; normal thought process Neuro: Sensation intact throughout. No gross coordination deficits.   Ortho Exam - Left shoulder: There is some TTP over the anterior aspect of the shoulder within the bicipital groove.  No overlying skin changes, erythema or ecchymosis.  No AC joint TTP.  Range of motion is full with flexion, abduction, there is some mildly limited external rotation both active and passively to about 65 degrees compared to 75 degrees of the contralateral arm. + Pain with speeds testing, resisted external rotation.  Negative Hawkins impingement testing.   - Cervical spine: No midline spinous process TTP.  There is relative straightening of the cervical lordotic curve.  Full range flexion, there is some limitation in extension of the neck.  Negative Spurling's test.  There is 5/5 strength of the upper extremity in the C5-T1 nerve distribution.  Imaging: XR Cervical Spine 2 or 3 views  Result Date: 04/19/2022 2 views of the cervical spine including AP and lateral femoral ordered and reviewed by myself.  X-rays demonstrate flattening of  the normal cervical lordosis.  There is cervical DJD from C3-C7.  Anterior spurring most notably at C5 and C6.  There is mild disc space loss between C6 and C7.  No acute fracture noted.  No significant facet arthropathy.  XR Shoulder Left  Result Date: 04/19/2022 Complete x-ray of the left shoulder including AP, Grashey, scapular Y and axial view were ordered and reviewed by myself.  X-rays demonstrate moderate glenohumeral joint arthritis.  There is some spurring off the inferior aspect of the acromion and the superior aspect of the greater tuberosity.  Moderate AC joint arthropathy.  No acute fracture noted.   Past Medical/Family/Surgical/Social History: Medications & Allergies reviewed per EMR, new medications updated. 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   Spinal stenosis at L4-L5 level 09/22/2016   AV block    Thiamine deficiency 11/19/2015   Depression with somatization 11/19/2015   Primary osteoarthritis of first carpometacarpal joint of left hand 10/23/2015   Chronic renal insufficiency, stage II (mild) 07/02/2014   Coronary artery disease involving native coronary artery of native heart with angina  pectoris (HCC)    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/2013   Migraine, unspecified, without mention of intractable migraine without mention of status migrainosus 03/26/2013   GERD (gastroesophageal reflux disease) 11/14/2012   Routine general medical examination at a health care facility 07/28/2012   ERECTILE DYSFUNCTION 08/12/2010   Hyperlipidemia with target LDL less than 70 10/15/2009   TOBACCO USE 10/15/2009   Essential hypertension 10/15/2009   MYOCARDIAL INFARCTION, HX OF 10/15/2009   Past Medical History:  Diagnosis Date   Apical variant hypertrophic cardiomyopathy (HCC)     CAD (coronary artery disease)    a. LHC in 2011 with severe dz in D1/D2 and very distal LAD--> too small for PCI and managed medically.  b.  LHC in 10/2013 after an abnormal nuclear study w/ non-obst dz/c. 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   HOH (hard of hearing)    rgiht ear   Hyperlipidemia    Hypertension    Migraine headache    Stroke [facility])    Family History  Problem Relation Age of Onset   Hypertension Mother    Heart failure Mother    Hypertension Father    Colon cancer Father 24   Colon cancer Paternal Uncle    Past Surgical History:  Procedure Laterality Date   CARDIAC CATHETERIZATION N/A 02/24/2016   Procedure: Left Heart Cath and Coronary Angiography;  Surgeon: Tonny Bollman, MD;  Location: [facility] INVASIVE CV LAB;  Service: Cardiovascular;  Laterality: N/A;   CARDIAC CATHETERIZATION N/A 02/24/2016   Procedure: Coronary Stent Intervention;  Surgeon: Tonny Bollman, MD;  Location: [facility] INVASIVE CV LAB;  Service: Cardiovascular;  Laterality: N/A;   CORONARY ANGIOPLASTY WITH STENT PLACEMENT  2017   CORONARY BALLOON ANGIOPLASTY N/A 04/30/2019   Procedure: CORONARY BALLOON ANGIOPLASTY;  Surgeon: Swaziland, Peter M, MD;  Location: [facility] INVASIVE CV LAB;  Service: Cardiovascular;  Laterality: N/A;   INGUINAL HERNIA REPAIR     LEFT HEART CATH AND CORONARY ANGIOGRAPHY N/A 04/30/2019   Procedure: LEFT HEART CATH AND CORONARY ANGIOGRAPHY;  Surgeon: Dolores Patty, MD;  Location: [facility] INVASIVE CV LAB;  Service: Cardiovascular;  Laterality: N/A;   LEFT HEART CATH AND CORONARY ANGIOGRAPHY N/A 05/14/2019   Procedure: LEFT HEART CATH AND CORONARY ANGIOGRAPHY;   Surgeon: Swazil[place], Peter M, MD;  Location: [facility] INVASIVE CV LAB;  Service: Cardiovascular;  Laterality: N/A;   LEFT HEART CATHETERIZATION WITH CORONARY ANGIOGRAM N/A 11/22/2013   Procedure: LEFT HEART CATHETERIZATION WITH CORONARY ANGIOGRAM;  Surgeon: [name], MD;  Location: [facility] CATH LAB;  Service: Cardiovascular;  Laterality: N/A;   TEE WITHOUT CARDIOVERSION N/A 08/22/2015   Procedure: TRANSESOPHAGEAL ECHOCARDIOGRAM (TEE);  Surgeon: Vesta Mixer, MD;  Location: [facility] ENDOSCOPY;  Service: Cardiovascular;  Laterality: N/A;   Social History   Occupational History   Not on file  Tobacco Use   Smoking status: Some Days    Packs/day: 1.50    Types: Cigarettes   Smokeless tobacco: Never  Vaping Use   Vaping Use: Never used  Substance and Sexual Activity   Alcohol use: No    Alcohol/week: 0.0 standard drinks of alcohol  Drug use: No   Sexual activity: Not Currently

## 2022-04-19 NOTE — Progress Notes (Signed)
Left shoulder pain for a few weeks Pain/numbness and tingling down arm Limited motion due to pain

## 2022-05-09 NOTE — Progress Notes (Deleted)
Established Patient Office Visit  Subjective   Patient ID: [name], male    DOB: 04/15/1961  Age: 61 y.o. MRN: [id number]  No chief complaint on file.   02/08/22 Patient returns in follow-up has not been seen since September of last year.  He has history of hypertension and on arrival blood pressure is 136/78 this is what he runs at home.  He is still smoking a pack a day of cigarettes.  He does complain of right chronic shoulder pain as well.  He saw cardiology in April of this year with no changes.  12/12 Essential hypertension - Primary (Chronic)       Hypertension not well controlled plan to will be to add amlodipine 10 mg daily see the patient back with clinical pharmacy short-term      Relevant Medications   losartan-hydrochlorothiazide (HYZAAR) 100-25 MG tablet   rosuvastatin (CRESTOR) 40 MG tablet   ranolazine (RANEXA) 500 MG 12 hr tablet   amLODipine (NORVASC) 10 MG tablet   Apical variant hypertrophic cardiomyopathy (HCC) (Chronic)      Was recommended an MRI but cannot afford the co-pay      Relevant Medications   losartan-hydrochlorothiazide (HYZAAR) 100-25 MG tablet   rosuvastatin (CRESTOR) 40 MG tablet   ranolazine (RANEXA) 500 MG 12 hr tablet   amLODipine (NORVASC) 10 MG tablet   Coronary artery disease involving native coronary artery of native heart with angina pectoris (HCC)      Continue high-dose aspirin and statin therapy      Relevant Medications   clopidogrel (PLAVIX) 75 MG tablet   losartan-hydrochlorothiazide (HYZAAR) 100-25 MG tablet   rosuvastatin (CRESTOR) 40 MG tablet   ranolazine (RANEXA) 500 MG 12 hr tablet   amLODipine (NORVASC) 10 MG tablet      Digestive   GERD (gastroesophageal reflux disease)      Continue proton pump inhibitor         Musculoskeletal and Integument   AC (acromioclavicular) arthritis      Has had arthritis in the right shoulder in the past will refer back to orthopedics         Genitourinary    Chronic renal insufficiency, stage II (mild) (Chronic)      Need to reassess renal function         Other   Hyperlipidemia with target LDL less than 70 (Chronic)      Cholesterol at goal      Relevant Medications   losartan-hydrochlorothiazide (HYZAAR) 100-25 MG tablet   rosuvastatin (CRESTOR) 40 MG tablet   ranolazine (RANEXA) 500 MG 12 hr tablet   amLODipine (NORVASC) 10 MG tablet   TOBACCO USE  11/23 clin pharm visit: Expand All Collapse All    S:      No chief complaint on file.   [name] is a 61 y.o. male who presents for hypertension evaluation, education, and management.  PMH is significant for HTN, CAD S/p cutting balloon angioplasty of the ramus intermedius on 04/30/2019, GERD, HLD, HTN, tobacco use, prior CVA, migraines.  Patient was referred and last seen by Primary Care Provider, Dr. Joya Gaskins, on 02/08/2022. BP was 136/78 mmHg at that appt. Amlodipine was started.     Today, patient arrives in good spirits and presents without assistance. Denies dizziness, headache, blurred vision, swelling.    Patient reports hypertension is longstanding.    Family/Social history:  -Fhx: HTN, CHF -Tobacco: current everyday smoker -Alcohol: none reported    Medication adherence reported. Patient takes his  BP medications at night.    Current antihypertensives include: amlodipine 10 mg daily, losartan-HCTZ 100-25 mg daily **takes ranolazine 500 mg BID. Cannot tolerate beta-blocker therapy.    Antihypertensives tried in the past include: hydralazine (HA), single agent HCTZ, olmesartan, irbesartan, labetalol (history of bradycardia)   Reported home BP readings: none recently   Patient reported dietary habits: -Tries to limit sodium -Does admit to drinking excessive amounts of coffee but plans to decrease this as he can.    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    ASCVD risk factors include: none   O:    Vitals:   03/12/22 1356 BP: (!) 146/77 Pulse: (!) 53     Last 3 Office BP readings:  BP Readings from Last 3 Encounters: 03/12/22 (!) 146/77 02/12/22 (!) 154/80 02/08/22 136/78     BMET  Labs (Brief)   Component Value Date/Time   NA 141 10/08/2021 1525   K 4.0 10/08/2021 1525   CL 101 10/08/2021 1525   CO2 25 10/08/2021 1525   GLUCOSE 81 10/08/2021 1525   GLUCOSE 81 03/16/2021 1406   BUN 16 10/08/2021 1525   CREATININE 1.42 (H) 10/08/2021 1525   CREATININE 1.33 03/09/2016 1032   CALCIUM 9.9 10/08/2021 1525   GFRNONAA >60 03/16/2021 1406   GFRAA 69 05/07/2020 0904      Renal function: CrCl cannot be calculated (Patient's most recent lab result is older than the maximum 21 days allowed.).   Clinical ASCVD: Yes  The ASCVD Risk score (Arnett DK, et al., 2019) failed to calculate for the following reasons:   The patient has a prior MI or stroke diagnosis   A/P: Hypertension diagnosed currently above goal on current medications. BP goal < 130/80 mmHg. Medication adherence appears appropriate. He plans to restart NRT using the lozenges and wants to cut back on caffeine consumption. His BP is higher today than it was with Dr. Joya Gaskins before initiation of amlodipine. Pt will return in 1 month for recheck.   -Continued current medications for now.  -Patient educated on purpose, proper use, and potential adverse effects of NRT/lozenges.  -F/u labs ordered - none today -Counseled on lifestyle modifications for blood pressure control including reduced dietary sodium, increased exercise, adequate sleep. -Encouraged patient to check BP at home and bring log of readings to next visit. Counseled on proper use of home BP cuff.     Results reviewed and written information provided.     Written patient instructions provided. Patient verbalized understanding of treatment plan.  Total time in face to face counseling 20 minutes.     Follow-up:  Pharmacist in 1 month.          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   Spinal stenosis at L4-L5 level 09/22/2016   AV block    Thiamine deficiency 11/19/2015   Depression with somatization 11/19/2015   Primary osteoarthritis of first carpometacarpal joint of left hand 10/23/2015   Chronic renal insufficiency, stage II (mild) 07/02/2014   Coronary artery disease involving native coronary artery of native heart with angina pectoris ([place])    Apical variant hypertrophic cardiomyopathy ([place]) 10/18/2013   Migraine, unspecified, without mention of intractable migraine without mention of status migrainosus 03/26/2013   GERD (gastroesophageal reflux disease) 11/14/2012  Routine general medical examination at a health care facility 07/28/2012   ERECTILE DYSFUNCTION 08/12/2010   Hyperlipidemia with target LDL less than 70 10/15/2009   TOBACCO USE 10/15/2009   Essential hypertension 10/15/2009   MYOCARDIAL INFARCTION, HX OF 10/15/2009   Past Medical History:  Diagnosis Date   Apical variant hypertrophic cardiomyopathy ([place])    CAD (coronary artery disease)    a. LHC in 2011 with severe dz in D1/D2 and very distal LAD--> too small for PCI and managed medically.  b.  LHC in 10/2013 after an abnormal nuclear study w/ non-obst dz/c. 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   HOH (hard of hearing)    rgiht ear   Hyperlipidemia    Hypertension    Migraine headache    Stroke [facility])    Past Surgical History:   Procedure Laterality Date   CARDIAC CATHETERIZATION N/A 02/24/2016   Procedure: Left Heart Cath and Coronary Angiography;  Surgeon: Sherren Mocha, MD;  Location: [place] [facility];  Service: Cardiovascular;  Laterality: N/A;   CARDIAC CATHETERIZATION N/A 02/24/2016   Procedure: Coronary Stent Intervention;  Surgeon: Sherren Mocha, MD;  Location: [place] [facility];  Service: Cardiovascular;  Laterality: N/A;   CORONARY ANGIOPLASTY WITH STENT PLACEMENT  2017   CORONARY BALLOON ANGIOPLASTY N/A 04/30/2019   Procedure: CORONARY BALLOON ANGIOPLASTY;  Surgeon: Martinique, Peter M, MD;  Location: [place] [facility];  Service: Cardiovascular;  Laterality: N/A;   INGUINAL HERNIA REPAIR     LEFT HEART CATH AND CORONARY ANGIOGRAPHY N/A 04/30/2019   Procedure: LEFT HEART CATH AND CORONARY ANGIOGRAPHY;  Surgeon: Jolaine Artist, MD;  Location: [place] [facility];  Service: Cardiovascular;  Laterality: N/A;   LEFT HEART CATH AND CORONARY ANGIOGRAPHY N/A 05/14/2019   Procedure: LEFT HEART CATH AND CORONARY ANGIOGRAPHY;  Surgeon: Martinique, Peter M, MD;  Location: [place] [facility];  Service: Cardiovascular;  Laterality: N/A;   LEFT HEART CATHETERIZATION WITH CORONARY ANGIOGRAM N/A 11/22/2013   Procedure: LEFT HEART CATHETERIZATION WITH CORONARY ANGIOGRAM;  Surgeon: [name], MD;  Location: [facility] CATH LAB;  Service: Cardiovascular;  Laterality: N/A;   TEE WITHOUT CARDIOVERSION N/A 08/22/2015   Procedure: TRANSESOPHAGEAL ECHOCARDIOGRAM (TEE);  Surgeon: Thayer Headings, MD;  Location: [facility] ENDOSCOPY;  Service: Cardiovascular;  Laterality: N/A;   Social History   Tobacco Use   Smoking status: Some Days    Packs/day: 1.50    Types: Cigarettes   Smokeless tobacco: Never  Vaping Use   Vaping Use: Never used  Substance Use Topics   Alcohol use: No    Alcohol/week: 0.0 standard drinks of alcohol   Drug use: No   Social History   Socioeconomic History   Marital status: Married    Spouse name: Not on  file   Number of children: Not on file   Years of education: Not on file   Highest education level: Not on file  Occupational History   Not on file  Tobacco Use   Smoking status: Some Days    Packs/day: 1.50    Types: Cigarettes   Smokeless tobacco: Never  Vaping Use   Vaping Use: Never used  Substance and Sexual Activity   Alcohol use: No    Alcohol/week: 0.0 standard drinks of alcohol   Drug use: No   Sexual activity: Not Currently  Other Topics Concern   Not on file  Social History Narrative   Not on file   Social Determinants of Health   Financial Resource Strain: Not on file  Food Insecurity: Not on file  Transportation Needs: Not on file  Physical Activity: Not on file  Stress: Not on file  Social Connections: Not on file  Intimate Partner Violence: Not on file   Family Status  Relation Name Status   Mother  Alive   Father  Alive   Annamarie Major  (Not Specified)   MGM  Deceased   MGF  Deceased   PGM  Deceased   PGF  Deceased   Family History  Problem Relation Age of Onset   Hypertension Mother    Heart failure Mother    Hypertension Father    Colon cancer Father 75   Colon cancer Paternal Uncle    Allergies  Allergen Reactions   Lisinopril Swelling    Facial swelling      Review of Systems  Constitutional:  Positive for weight loss. Negative for chills, diaphoresis, fever and malaise/fatigue.       No appt  HENT:  Negative for congestion, hearing loss, nosebleeds, sore throat and tinnitus.   Eyes:  Negative for blurred vision, photophobia and redness.  Respiratory:  Negative for cough, hemoptysis, sputum production, shortness of breath, wheezing and stridor.   Cardiovascular:  Negative for chest pain, palpitations, orthopnea, claudication, leg swelling and PND.  Gastrointestinal:  Negative for abdominal pain, blood in stool, constipation, diarrhea, heartburn, nausea and vomiting.  Genitourinary:  Negative for dysuria, flank pain, frequency,  hematuria and urgency.  Musculoskeletal:  Negative for back pain, falls, joint pain, myalgias and neck pain.       Knot on shoulder Right  Skin:  Negative for itching and rash.  Neurological:  Negative for dizziness, tingling, tremors, sensory change, speech change, focal weakness, seizures, loss of consciousness, weakness and headaches.  Endo/Heme/Allergies:  Negative for environmental allergies and polydipsia. Does not bruise/bleed easily.  Psychiatric/Behavioral:  Negative for depression, memory loss, substance abuse and suicidal ideas. The patient is not nervous/anxious and does not have insomnia.       Objective:     There were no vitals taken for this visit. BP Readings from Last 3 Encounters:  04/16/22 139/61  03/12/22 (!) 146/77  02/12/22 (!) 154/80   Wt Readings from Last 3 Encounters:  02/12/22 165 lb (74.8 kg)  02/08/22 164 lb 12.8 oz (74.8 kg)  10/08/21 167 lb (75.8 kg)      Physical Exam Vitals reviewed.  Constitutional:      Appearance: Normal appearance. He is well-developed and normal weight. He is not diaphoretic.  HENT:     Head: Normocephalic and atraumatic.     Nose: No nasal deformity, septal deviation, mucosal edema or rhinorrhea.     Right Sinus: No maxillary sinus tenderness or frontal sinus tenderness.     Left Sinus: No maxillary sinus tenderness or frontal sinus tenderness.     Mouth/Throat:     Pharynx: No oropharyngeal exudate.  Eyes:     General: No scleral icterus.    Conjunctiva/sclera: Conjunctivae normal.     Pupils: Pupils are equal, round, and reactive to light.  Neck:  Thyroid: No thyromegaly.     Vascular: No carotid bruit or JVD.     Trachea: Trachea normal. No tracheal tenderness or tracheal deviation.  Cardiovascular:     Rate and Rhythm: Normal rate and regular rhythm.     Chest Wall: PMI is not displaced.     Pulses: Normal pulses. No decreased pulses.     Heart sounds: Normal heart sounds, S1 normal and S2 normal. Heart  sounds not distant. No murmur heard.    No systolic murmur is present.     No diastolic murmur is present.     No friction rub. No gallop. No S3 or S4 sounds.  Pulmonary:     Effort: No tachypnea, accessory muscle usage or respiratory distress.     Breath sounds: No stridor. No decreased breath sounds, wheezing, rhonchi or rales.  Chest:     Chest wall: No tenderness.  Abdominal:     General: Bowel sounds are normal. There is no distension.     Palpations: Abdomen is soft. Abdomen is not rigid.     Tenderness: There is no abdominal tenderness. There is no guarding or rebound.  Musculoskeletal:        General: Swelling and tenderness present. Normal range of motion.     Cervical back: Normal range of motion and neck supple. No edema, erythema or rigidity. No muscular tenderness. Normal range of motion.     Comments: Swelling at the right shoulder apex  Lymphadenopathy:     Head:     Right side of head: No submental or submandibular adenopathy.     Left side of head: No submental or submandibular adenopathy.     Cervical: No cervical adenopathy.  Skin:    General: Skin is warm and dry.     Coloration: Skin is not pale.     Findings: No rash.     Nails: There is no clubbing.  Neurological:     Mental Status: He is alert and oriented to person, place, and time.     Sensory: No sensory deficit.  Psychiatric:        Speech: Speech normal.        Behavior: Behavior normal.      No results found for any visits on 05/11/22.  Last CBC Lab Results  Component Value Date   WBC 6.0 10/08/2021   HGB 12.7 (L) 10/08/2021   HCT 37.7 10/08/2021   MCV 89 10/08/2021   MCH 29.9 10/08/2021   RDW 10.9 (L) 10/08/2021   PLT 309 56/21/3086   Last metabolic panel Lab Results  Component Value Date   GLUCOSE 81 10/08/2021   NA 141 10/08/2021   K 4.0 10/08/2021   CL 101 10/08/2021   CO2 25 10/08/2021   BUN 16 10/08/2021   CREATININE 1.42 (H) 10/08/2021   EGFR 57 (L) 10/08/2021   CALCIUM  9.9 10/08/2021   PHOS 3.5 09/17/2009   PROT 7.5 10/08/2021   ALBUMIN 4.7 10/08/2021   LABGLOB 2.8 10/08/2021   AGRATIO 1.7 10/08/2021   BILITOT 0.7 10/08/2021   ALKPHOS 80 10/08/2021   AST 21 10/08/2021   ALT 13 10/08/2021   ANIONGAP 10 03/16/2021   Last lipids Lab Results  Component Value Date   CHOL 95 (L) 12/11/2020   HDL 48 12/11/2020   LDLCALC 36 12/11/2020   TRIG 41 12/11/2020   CHOLHDL 2.0 12/11/2020   Last hemoglobin A1c Lab Results  Component Value Date   HGBA1C 5.3 04/29/2019  The ASCVD Risk score (Arnett DK, et al., 2019) failed to calculate for the following reasons:   The patient has a prior MI or stroke diagnosis    Assessment & Plan:   Problem List Items Addressed This Visit   None  No follow-ups on file.    Asencion Noble, MD

## 2022-05-10 ENCOUNTER — Encounter: Payer: Self-pay | Admitting: Sports Medicine

## 2022-05-10 ENCOUNTER — Ambulatory Visit: Payer: 59 | Admitting: Sports Medicine

## 2022-05-10 ENCOUNTER — Other Ambulatory Visit: Payer: Self-pay

## 2022-05-10 DIAGNOSIS — M25512 Pain in left shoulder: Secondary | ICD-10-CM

## 2022-05-10 DIAGNOSIS — M19012 Primary osteoarthritis, left shoulder: Secondary | ICD-10-CM

## 2022-05-10 DIAGNOSIS — G8929 Other chronic pain: Secondary | ICD-10-CM

## 2022-05-10 DIAGNOSIS — G629 Polyneuropathy, unspecified: Secondary | ICD-10-CM

## 2022-05-10 DIAGNOSIS — M4722 Other spondylosis with radiculopathy, cervical region: Secondary | ICD-10-CM

## 2022-05-10 MED ORDER — GABAPENTIN 300 MG PO CAPS
300.0000 mg | ORAL_CAPSULE | Freq: Two times a day (BID) | ORAL | 1 refills | Status: DC
  Filled 2022-05-10: qty 60, 30d supply, fill #0

## 2022-05-10 MED ORDER — VITAMIN B-6 100 MG PO TABS
100.0000 mg | ORAL_TABLET | Freq: Every day | ORAL | 1 refills | Status: DC
  Filled 2022-05-10: qty 100, 100d supply, fill #0

## 2022-05-10 NOTE — Patient Instructions (Signed)
Adora Fridge to see you again. I'm glad the shoulder is doing well.  For the numbness/tingling:  - begin Gabapentin 300mg  (1 capsule) --> take this in the morning and in the evening before bedtime. This take a few weeks to build-up in your system. It sometimes can make you tired, so if it does you can take it in the evening only. - Take Vitamin B6 once daily (you can get this over-the-counter if cheaper)  - f/u with me in 5-6 weeks  Dr. 

## 2022-05-10 NOTE — Progress Notes (Signed)
Doing good; the injection helped with the pain  Did not do much for the numbness/tingling he has, but overall better  He is also doing the HEP that we sent home with him with no issues

## 2022-05-10 NOTE — Progress Notes (Signed)
[name] - 61 y.o. male MRN [id number]  Date of birth: 1960-11-03  Office Visit Note: Visit Date: 05/10/2022 PCP: Storm Frisk, MD Referred by: Storm Frisk, MD  Subjective: Chief Complaint  Patient presents with   Left Shoulder - Pain   HPI: [name] is a pleasant 61 y.o. male who presents today for f/u of left shoulder pain and LUE radiculopathy.  Left shoulder - We did do an ultrasound-guided glenohumeral joint injection on 04/19/2022.  He states his left shoulder is markedly improved after the injection.  He is also continuing his home exercise program a few times a week without any issues. He is very happy with his shoulder pain resolution.  Left upper extremity radiculopathy -denies any specific pain coming from the neck, but continues with numbness and tingling from the shoulder down the medial aspect of the arm into fingers 3-5.  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  He did notice some reproduction of his pain when he was carrying a heavy tool belt on the opposite arm.  Pain is more so bothersome in the evening after work and at night.  Denies any weakness or loss of grip strength.  Pertinent ROS were reviewed with the patient and found to be negative unless otherwise specified above in HPI.   Assessment & Plan: Visit Diagnoses:  1. Chronic pain in left shoulder   2. Neuropathy   3. Osteoarthritis of spine with radiculopathy, cervical region   4. Glenohumeral arthritis, left    Plan: Discussed with Younis I am glad he had such good relief from the shoulder injection.  I would like him to continue his home exercises for the shoulder 3-4 times weekly for maintenance and prevention of recurring pain.  We discussed his radicular symptoms, given his previous x-rays and his symptoms I believe this is more so coming from the neck.  He has no weakness and has not tried any pharmacologic intervention yet.  We discussed all treatment options  such as medication therapy, cervical MRI or nerve conduction studies.  He decided on pharmacological therapy, will start gabapentin 300 mg twice daily.  Also discussed appropriate nerve health with vitamin B6 100 mg once daily.  We have plenty of room to go up on the gabapentin as needed.  Discussed risk/benefits/indications.  Will follow-up in about 5-6 weeks for reevaluation.  Follow-up: Return in about 6 weeks (around 06/21/2022) for f/u in 5-6 weeks for numbness/tingling in arm, shoulder pain.   Meds & Orders:  Meds ordered this encounter  Medications   gabapentin (NEURONTIN) 300 MG capsule    Sig: Take 1 capsule (300 mg total) by mouth 2 (two) times daily.    Dispense:  60 capsule    Refill:  1   pyridOXINE (VITAMIN B6) 100 MG tablet    Sig: Take 1 tablet (100 mg total) by mouth daily.    Dispense:  60 tablet    Refill:  1   No orders of the defined types were placed in this encounter.    Procedures: No procedures performed      Clinical History: No specialty comments available.  He reports that he has been smoking cigarettes. He has been smoking an average of 1.5 packs per day. He has never used smokeless tobacco. No results for input(s): "HGBA1C", "LABURIC" in the last 8760 hours.  Objective:    Physical Exam  Gen: Well-appearing, in no acute distress; non-toxic CV: Well-perfused. Warm.  Resp:  Breathing unlabored on room air; no wheezing. Psych: Fluid speech in conversation; appropriate affect; normal thought process Neuro: Sensation intact throughout. No gross coordination deficits.   Ortho Exam -Left shoulder: No overlying skin changes, erythema or effusion.  There is no bony tenderness to palpation.  He has full active and passive range of motion in all directions.  5/5 strength.  Negative impingement testing.  Active external rotation to about 95 degrees, there is a mild bony block here without pain today.  Equivocal Tinel's at the cubital tunnel, negative Tinel's at  Guyon's canal.  -Cervical spine: No midline spinous process TTP.  There is relative straightening of the cervical lordotic curve.  There is some limitation in extension of the neck without pain.  Negative Spurling's test today.  5/5 strength in the upper extremity in the C5-T1 nerve distribution.  Imaging:  Result Date: 04/19/2022 2 views of the cervical spine including AP and lateral femoral ordered and reviewed by myself.  X-rays demonstrate flattening of the normal cervical lordosis.  There is cervical DJD from C3-C7.  Anterior spurring most notably at C5 and C6.  There is mild disc space loss between C6 and C7.  No acute fracture noted.  No significant facet arthropathy.    Past Medical/Family/Surgical/Social History: Medications & Allergies reviewed per EMR, new medications updated. 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   Spinal stenosis at L4-L5 level 09/22/2016   AV block    Thiamine deficiency 11/19/2015   Depression with somatization 11/19/2015   Primary osteoarthritis of first carpometacarpal joint of left hand 10/23/2015   Chronic renal insufficiency, stage II (mild) 07/02/2014   Coronary artery disease involving native coronary artery of native heart with angina pectoris (HCC)    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/2013   Migraine, unspecified, without mention of intractable migraine without mention of status migrainosus 03/26/2013   GERD (gastroesophageal reflux disease) 11/14/2012   Routine general medical examination at a health care facility 07/28/2012   ERECTILE DYSFUNCTION 08/12/2010   Hyperlipidemia with target LDL less than 70 10/15/2009   TOBACCO USE 10/15/2009   Essential hypertension 10/15/2009   MYOCARDIAL INFARCTION, HX OF 10/15/2009   Past Medical History:   Diagnosis Date   Apical variant hypertrophic cardiomyopathy (HCC)    CAD (coronary artery disease)    a. LHC in 2011 with severe dz in D1/D2 and very distal LAD--> too small for PCI and managed medically.  b.  LHC in 10/2013 after an abnormal nuclear study w/ non-obst dz/c. 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   HOH (hard of hearing)    rgiht ear   Hyperlipidemia    Hypertension    Migraine headache    Stroke [facility])    Family History  Problem Relation Age of Onset   Hypertension Mother    Heart failure Mother    Hypertension Father    Colon cancer Father 39   Colon cancer Paternal Uncle  Past Surgical History:  Procedure Laterality Date   CARDIAC CATHETERIZATION N/A 02/24/2016   Procedure: Left Heart Cath and Coronary Angiography;  Surgeon: Tonny Bollman, MD;  Location: [facility] INVASIVE CV LAB;  Service: Cardiovascular;  Laterality: N/A;   CARDIAC CATHETERIZATION N/A 02/24/2016   Procedure: Coronary Stent Intervention;  Surgeon: Tonny Bollman, MD;  Location: [facility] INVASIVE CV LAB;  Service: Cardiovascular;  Laterality: N/A;   CORONARY ANGIOPLASTY WITH STENT PLACEMENT  2017   CORONARY BALLOON ANGIOPLASTY N/A 04/30/2019   Procedure: CORONARY BALLOON ANGIOPLASTY;  Surgeon: Swaziland, Peter M, MD;  Location: [facility] INVASIVE CV LAB;  Service: Cardiovascular;  Laterality: N/A;   INGUINAL HERNIA REPAIR     LEFT HEART CATH AND CORONARY ANGIOGRAPHY N/A 04/30/2019   Procedure: LEFT HEART CATH AND CORONARY ANGIOGRAPHY;  Surgeon: Dolores Patty, MD;  Location: [facility] INVASIVE CV LAB;  Service: Cardiovascular;  Laterality: N/A;   LEFT HEART CATH AND CORONARY ANGIOGRAPHY N/A  05/14/2019   Procedure: LEFT HEART CATH AND CORONARY ANGIOGRAPHY;  Surgeon: Swaziland, Peter M, MD;  Location: [facility] INVASIVE CV LAB;  Service: Cardiovascular;  Laterality: N/A;   LEFT HEART CATHETERIZATION WITH CORONARY ANGIOGRAM N/A 11/22/2013   Procedure: LEFT HEART CATHETERIZATION WITH CORONARY ANGIOGRAM;  Surgeon: [name], MD;  Location: [facility] CATH LAB;  Service: Cardiovascular;  Laterality: N/A;   TEE WITHOUT CARDIOVERSION N/A 08/22/2015   Procedure: TRANSESOPHAGEAL ECHOCARDIOGRAM (TEE);  Surgeon: Vesta Mixer, MD;  Location: [facility] ENDOSCOPY;  Service: Cardiovascular;  Laterality: N/A;   Social History   Occupational History   Not on file  Tobacco Use   Smoking status: Some Days    Packs/day: 1.50    Types: Cigarettes   Smokeless tobacco: Never  Vaping Use   Vaping Use: Never used  Substance and Sexual Activity   Alcohol use: No    Alcohol/week: 0.0 standard drinks of alcohol   Drug use: No   Sexual activity: Not Currently

## 2022-05-11 ENCOUNTER — Ambulatory Visit: Payer: 59 | Admitting: Critical Care Medicine

## 2022-05-14 ENCOUNTER — Other Ambulatory Visit: Payer: Self-pay

## 2022-05-19 ENCOUNTER — Ambulatory Visit: Payer: 59 | Admitting: Critical Care Medicine

## 2022-05-26 ENCOUNTER — Other Ambulatory Visit: Payer: Self-pay

## 2022-06-21 ENCOUNTER — Ambulatory Visit: Payer: 59 | Admitting: Sports Medicine

## 2022-06-23 ENCOUNTER — Ambulatory Visit: Payer: 59 | Attending: Critical Care Medicine | Admitting: Physician Assistant

## 2022-06-23 ENCOUNTER — Encounter: Payer: Self-pay | Admitting: Physician Assistant

## 2022-06-23 VITALS — BP 140/80 | HR 60 | Ht 68.0 in | Wt 172.8 lb

## 2022-06-23 DIAGNOSIS — E785 Hyperlipidemia, unspecified: Secondary | ICD-10-CM

## 2022-06-23 DIAGNOSIS — M25511 Pain in right shoulder: Secondary | ICD-10-CM

## 2022-06-23 DIAGNOSIS — I25119 Atherosclerotic heart disease of native coronary artery with unspecified angina pectoris: Secondary | ICD-10-CM | POA: Diagnosis not present

## 2022-06-23 DIAGNOSIS — G8929 Other chronic pain: Secondary | ICD-10-CM

## 2022-06-23 DIAGNOSIS — Z91199 Patient's noncompliance with other medical treatment and regimen due to unspecified reason: Secondary | ICD-10-CM | POA: Diagnosis not present

## 2022-06-23 DIAGNOSIS — I10 Essential (primary) hypertension: Secondary | ICD-10-CM

## 2022-06-23 MED ORDER — CLOPIDOGREL BISULFATE 75 MG PO TABS: ORAL_TABLET | Freq: Every day | ORAL | 3 refills | Status: DC

## 2022-06-23 MED ORDER — ROSUVASTATIN CALCIUM 40 MG PO TABS: ORAL_TABLET | Freq: Every day | ORAL | 2 refills | Status: DC

## 2022-06-23 MED ORDER — RANOLAZINE ER 500 MG PO TB12: ORAL_TABLET | Freq: Two times a day (BID) | ORAL | 1 refills | Status: DC

## 2022-06-23 MED ORDER — GABAPENTIN 300 MG PO CAPS: 300.0000 mg | ORAL_CAPSULE | Freq: Two times a day (BID) | ORAL | 1 refills | Status: DC

## 2022-06-23 MED ORDER — AMLODIPINE BESYLATE 10 MG PO TABS: 10.0000 mg | ORAL_TABLET | Freq: Every day | ORAL | 1 refills | Status: DC

## 2022-06-23 MED ORDER — LOSARTAN POTASSIUM-HCTZ 100-25 MG PO TABS: 1.0000 | ORAL_TABLET | Freq: Every day | ORAL | 2 refills | Status: DC

## 2022-06-23 NOTE — Patient Instructions (Signed)
Goal Blood pressure is <130/<85

## 2022-06-23 NOTE — Progress Notes (Signed)
Patient ID: [name], male   DOB: [birth year], 61 y.o.   MRN: [id number]   [name], is a 62 y.o. male  QIO:[id number]  LKG:[id number]  DOB - 06-Jan-1961  Chief Complaint  Patient presents with   Medication Refill       Subjective:   [name] is a 62 y.o. male here today for med RF.  No longer having stomach pain.  Appetite is normal.  He is partially compliant with meds.  He usu misses a few days a week on meds.  BP when he checks it at home 130s/70-80.  No HA/SOB/CP.  No new issues and concerns.   No problems updated.  ALLERGIES: Allergies  Allergen Reactions   Lisinopril Swelling    Facial swelling    PAST MEDICAL HISTORY: Past Medical History:  Diagnosis Date   Apical variant hypertrophic cardiomyopathy ([place])    CAD (coronary artery disease)    a. LHC in 2011 with severe dz in D1/D2 and very distal LAD--> too small for PCI and managed medically.  b.  LHC in 10/2013 after an abnormal nuclear study w/ non-obst dz/c. 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   HOH (hard of hearing)    rgiht ear   Hyperlipidemia    Hypertension    Migraine headache    Stroke [facility])     MEDICATIONS AT HOME: Prior to Admission medications   Medication Sig Start Date End Date Taking? Authorizing Provider  amLODipine (NORVASC) 10 MG tablet Take 1 tablet (10 mg total) by mouth daily. 06/23/22   Argentina Donovan, PA-C  clopidogrel (PLAVIX) 75 MG tablet TAKE 1 TABLET (75 MG TOTAL) BY MOUTH DAILY. 06/23/22 06/23/23  Argentina Donovan, PA-C  Evolocumab (REPATHA SURECLICK) 664 MG/ML SOAJ Inject 1 Pen into the skin every 14 (fourteen) days. 12/25/21   Belva Crome, MD  gabapentin (NEURONTIN) 300 MG capsule Take 1 capsule (300 mg total) by mouth 2 (two) times daily. 06/23/22 08/22/22  Argentina Donovan, PA-C  losartan-hydrochlorothiazide (HYZAAR) 100-25 MG tablet TAKE 1 TABLET BY MOUTH DAILY. 06/23/22   Argentina Donovan, PA-C  nicotine polacrilex (NICORETTE MINI) 4 MG lozenge Use 1-3 daily to quit smoking 03/12/22   Elsie Stain, MD  nitroGLYCERIN (NITROSTAT) 0.4 MG SL tablet Place 1 tablet (0.4 mg total) under the tongue every 5 (five) minutes as needed for chest pain. 12/11/20 10/08/21  Elsie Stain, MD  pyridOXINE (VITAMIN B6) 100 MG tablet Take 1 tablet (100 mg total) by mouth daily. 05/10/22   Elba Barman, DO  ranolazine (RANEXA) 500 MG 12 hr tablet TAKE 1 TABLET (500 MG TOTAL) BY MOUTH 2 (TWO) TIMES DAILY. 06/23/22 06/23/23  Argentina Donovan, PA-C  rosuvastatin (CRESTOR) 40 MG tablet TAKE 1 TABLET (40 MG TOTAL) BY MOUTH DAILY. 06/23/22 06/23/23  Argentina Donovan, PA-C    ROS: Neg HEENT Neg resp Neg cardiac Neg GI Neg GU Neg MS Neg psych Neg neuro  Objective:   Vitals:   06/23/22 0843 06/23/22 0854  BP: (!) 158/90 (!) 140/80  Pulse: 60   SpO2: 99%   Weight: 172  lb 12.8 oz (78.4 kg)   Height: 5\' 8"  (1.727 m)    Exam General appearance : Awake, alert, not in any distress. Speech Clear. Not toxic looking HEENT: Atraumatic and Normocephalic Neck: Supple, no JVD. No cervical lymphadenopathy.  Chest: Good air entry bilaterally, CTAB.  No rales/rhonchi/wheezing CVS: S1 S2 regular, no murmurs.  Extremities: B/L Lower Ext shows no edema, both legs are warm to touch Neurology: Awake alert, and oriented X 3, CN II-XII intact, Non focal Skin: No Rash  Data Review Lab Results  Component Value Date   HGBA1C 5.3 04/29/2019   HGBA1C  09/17/2009    5.1 (NOTE)                                                                       According to the ADA Clinical Practice Recommendations for 2011, when HbA1c is used as a screening test:    >=6.5%   Diagnostic of Diabetes Mellitus           (if abnormal result  is confirmed)  5.7-6.4%   Increased risk of developing Diabetes Mellitus  References:Diagnosis and Classification of Diabetes Mellitus,Diabetes QIWL,7989,21(JHERD 1):S62-S69 and Standards of Medical Care in         Diabetes - 2011,Diabetes Care,2011,34  (Suppl 1):S11-S61.    Assessment & Plan   1. Coronary artery disease involving native coronary artery of native heart with angina pectoris (HCC) - ranolazine (RANEXA) 500 MG 12 hr tablet; TAKE 1 TABLET (500 MG TOTAL) BY MOUTH 2 (TWO) TIMES DAILY.  Dispense: 180 tablet; Refill: 1 - rosuvastatin (CRESTOR) 40 MG tablet; TAKE 1 TABLET (40 MG TOTAL) BY MOUTH DAILY.  Dispense: 90 tablet; Refill: 2 - clopidogrel (PLAVIX) 75 MG tablet; TAKE 1 TABLET (75 MG TOTAL) BY MOUTH DAILY.  Dispense: 90 tablet; Refill: 3 - Comprehensive metabolic panel - Lipid panel  2. Hyperlipidemia, unspecified hyperlipidemia type - rosuvastatin (CRESTOR) 40 MG tablet; TAKE 1 TABLET (40 MG TOTAL) BY MOUTH DAILY.  Dispense: 90 tablet; Refill: 2 - Lipid panel  3. Essential hypertension Compliance imperative-discussed at length.  Encouraged alert reminders on his phone - amLODipine (NORVASC) 10 MG tablet; Take 1 tablet (10 mg total) by mouth daily.  Dispense: 90 tablet; Refill: 1 - losartan-hydrochlorothiazide (HYZAAR) 100-25 MG tablet; TAKE 1 TABLET BY MOUTH DAILY.  Dispense: 90 tablet; Refill: 2 - Comprehensive metabolic panel  4. Poor compliance Compliance imperative-discussed at length.  Encouraged alert reminders on his phone  5. Chronic right shoulder pain Followed by ortho - gabapentin (NEURONTIN) 300 MG capsule; Take 1 capsule (300 mg total) by mouth 2 (two) times daily.  Dispense: 60 capsule; Refill: 1    Return in about 4 months (around 10/22/2022) for PCP for chronic conditions.  The patient was given clear instructions to go to ER or return to medical center if symptoms don't improve,  worsen or new problems develop. The patient verbalized understanding. The patient was told to call to get lab results if they haven't heard anything in the next week.      Freeman Caldron, PA-C [facility] and [facility] [place], [place]   06/23/2022, 8:55 AM

## 2022-06-24 LAB — COMPREHENSIVE METABOLIC PANEL
ALT: 12 IU/L (ref 0–44)
AST: 18 IU/L (ref 0–40)
Albumin/Globulin Ratio: 1.8 (ref 1.2–2.2)
Albumin: 4.6 g/dL (ref 3.8–4.9)
Alkaline Phosphatase: 81 IU/L (ref 44–121)
BUN/Creatinine Ratio: 8 — ABNORMAL LOW (ref 10–24)
BUN: 12 mg/dL (ref 8–27)
Bilirubin Total: 0.6 mg/dL (ref 0.0–1.2)
CO2: 22 mmol/L (ref 20–29)
Calcium: 9.5 mg/dL (ref 8.6–10.2)
Chloride: 102 mmol/L (ref 96–106)
Creatinine, Ser: 1.58 mg/dL — ABNORMAL HIGH (ref 0.76–1.27)
Globulin, Total: 2.5 g/dL (ref 1.5–4.5)
Glucose: 81 mg/dL (ref 70–99)
Potassium: 3.8 mmol/L (ref 3.5–5.2)
Sodium: 140 mmol/L (ref 134–144)
Total Protein: 7.1 g/dL (ref 6.0–8.5)
eGFR: 50 mL/min/{1.73_m2} — ABNORMAL LOW (ref >59)

## 2022-06-24 LAB — LIPID PANEL
Chol/HDL Ratio: 1.7 ratio (ref 0.0–5.0)
Cholesterol, Total: 102 mg/dL (ref 100–199)
HDL: 60 mg/dL (ref >39)
LDL Chol Calc (NIH): 32 mg/dL (ref 0–99)
Triglycerides: 36 mg/dL (ref 0–149)
VLDL Cholesterol Cal: 10 mg/dL (ref 5–40)

## 2022-07-07 ENCOUNTER — Ambulatory Visit: Payer: Self-pay

## 2022-07-07 NOTE — Telephone Encounter (Signed)
Reason for Disposition  Scrotum looks infected (e.g., draining sore, ulcer, red rash)  Answer Assessment - Initial Assessment Questions 1. LOCATION and RADIATION: "Where is the pain located?"      Left testicle with a hard knot started a week ago.  Discharge from penis that is light. 2. QUALITY: "What does the pain feel like?"  (e.g., sharp, dull, aching, burning)     Left testicle pain when pressed 3. SEVERITY: "How bad is the pain?"  (Scale 1-10; or mild, moderate, severe)   - MILD (1-3): doesn't interfere with normal activities    - MODERATE (4-7): interferes with normal activities (e.g., work or school) or awakens from sleep   - SEVERE (8-10): excruciating pain, unable to do any normal activities, difficulty walking     Painful whe pressed 4. ONSET: "When did the pain start?"     A week ago 5. PATTERN: "Does it come and go, or has it been constant since it started?"     Discharge is constant for a week. 6. SCROTAL APPEARANCE: "What does the scrotum look like?" "Is there any swelling or redness?"      Slightly red 7. HERNIA: "Has a doctor ever told you that you have a hernia?"     No 8. OTHER SYMPTOMS: "Do you have any other symptoms?" (e.g., fever, abdominal pain, vomiting, difficulty passing urine)     No  Protocols used: Scrotal Pain-A-AH  Chief Complaint: Knot in left testicle and discharge from penis Symptoms: Light colored discharge form penis.   Hard knot in left testicle Frequency: Started a week ago both symptoms Pertinent Negatives: Patient denies injuries or accidents   No burning with urination or blood in urine. Disposition: [] ED /[x] Urgent Care (no appt availability in office) / [] Appointment(In office/virtual)/ []  [place] [facility]/ [] Home Care/ [] Refused Recommended Disposition /[] [place] [facility]/ []  Follow-up with PCP Additional Notes:  Mobile Unit in [facility] today.   No appts with [facility].   Referred him to the  urgent care which he is agreeable to going.

## 2022-07-07 NOTE — Telephone Encounter (Signed)
Summary: Pt has a knot on testicle and discharge   Please fu up with pt. He has a knot on his left testicle and has a discharge. He needs an appt with Dr Joya Gaskins. Maybe possibly a work-in for Dr Joya Gaskins? Pls fu to advise. [phone]. Leave a message if needed.        Left message to call back about his symptoms.

## 2022-07-08 ENCOUNTER — Ambulatory Visit: Payer: 59 | Attending: Critical Care Medicine

## 2022-07-08 ENCOUNTER — Telehealth (HOSPITAL_BASED_OUTPATIENT_CLINIC_OR_DEPARTMENT_OTHER): Payer: 59 | Admitting: Family Medicine

## 2022-07-08 DIAGNOSIS — R369 Urethral discharge, unspecified: Secondary | ICD-10-CM

## 2022-07-08 DIAGNOSIS — N5089 Other specified disorders of the male genital organs: Secondary | ICD-10-CM

## 2022-07-08 DIAGNOSIS — R300 Dysuria: Secondary | ICD-10-CM | POA: Diagnosis not present

## 2022-07-08 NOTE — Progress Notes (Signed)
Virtual Visit via Video Note  I connected with [name], on 07/08/2022 at 8:48 AM by video enabled telemedicine device and verified that I am speaking with the correct person using two identifiers.   Consent: I discussed the limitations, risks, security and privacy concerns of performing an evaluation and management service by telemedicine and the availability of in person appointments. I also discussed with the patient that there may be a patient responsible charge related to this service. The patient expressed understanding and agreed to proceed.   Location of Patient: Home  Location of Provider: Clinic   Persons participating in Telemedicine visit: Demetrice Legleiter Dr. Margarita Rana     History of Present Illness: [name] is a 62 y.o. year old male patient of Dr. Joya Gaskins with a history of hypertension, CAD, migraines seen for an acute visit.   He Complains of left testicular swelling the size of a dime noticed over the last month. He has penile discharge which is off white, no dysuria, no hematuria. He has had urinary frequency which is not new as he takes diuretics Testicle is painful on touching but no tenderness at rest.  He denies presence of engorged or enlarged vessels on his testicle.  Past Medical History:  Diagnosis Date   Apical variant hypertrophic cardiomyopathy ([place])    CAD (coronary artery disease)    a. LHC in 2011 with severe dz in D1/D2 and very distal LAD--> too small for PCI and managed medically.  b.  LHC in 10/2013 after an abnormal nuclear study w/ non-obst dz/c. 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   HOH (hard of  hearing)    rgiht ear   Hyperlipidemia    Hypertension    Migraine headache    Stroke (HCC)    Allergies  Allergen Reactions   Lisinopril Swelling    Facial swelling    Current Outpatient Medications on File Prior to Visit  Medication Sig Dispense Refill   amLODipine (NORVASC) 10 MG tablet Take 1 tablet (10 mg total) by mouth daily. 90 tablet 1   clopidogrel (PLAVIX) 75 MG tablet TAKE 1 TABLET (75 MG TOTAL) BY MOUTH DAILY. 90 tablet 3   Evolocumab (REPATHA SURECLICK) [id number] MG/ML SOAJ Inject 1 Pen into the skin every 14 (fourteen) days. 6 mL 3   gabapentin (NEURONTIN) 300 MG capsule Take 1 capsule (300 mg total) by mouth 2 (two) times daily. 60 capsule 1   losartan-hydrochlorothiazide (HYZAAR) 100-25 MG tablet TAKE 1 TABLET BY MOUTH DAILY. 90 tablet 2   nicotine polacrilex (NICORETTE MINI) 4 MG lozenge Use 1-3 daily to quit smoking 100 tablet 4   nitroGLYCERIN (NITROSTAT) 0.4 MG SL tablet Place 1 tablet (0.4 mg total) under the tongue every 5 (five) minutes as needed for chest pain. 25 tablet 3   pyridOXINE (VITAMIN B6) 100 MG tablet Take 1 tablet (100 mg total) by mouth daily. 60 tablet 1   ranolazine (RANEXA) 500 MG 12 hr tablet TAKE 1 TABLET (500 MG TOTAL) BY MOUTH 2 (TWO) TIMES DAILY. 180 tablet 1   rosuvastatin (CRESTOR) 40 MG tablet TAKE 1 TABLET (40 MG  TOTAL) BY MOUTH DAILY. 90 tablet 2   No current facility-administered medications on file prior to visit.    ROS: See HPI  Observations/Objective: Awake, alert, oriented x3 Not in acute distress Normal mood      Latest Ref Rng & Units 06/23/2022    9:02 AM 10/08/2021    3:25 PM 03/16/2021    2:06 PM  CMP  Glucose 70 - 99 mg/dL 81  81  81   BUN 8 - 27 mg/dL 12  16  18   $ Creatinine 0.76 - 1.27 mg/dL 1.58  1.42  1.21   Sodium 134 - 144 mmol/L 140  141  138   Potassium 3.5 - 5.2 mmol/L 3.8  4.0  3.5   Chloride 96 - 106 mmol/L 102  101  103   CO2 20 - 29 mmol/L 22  25  25   $ Calcium 8.6 - 10.2 mg/dL 9.5  9.9  9.1   Total  Protein 6.0 - 8.5 g/dL 7.1  7.5    Total Bilirubin 0.0 - 1.2 mg/dL 0.6  0.7    Alkaline Phos 44 - 121 IU/L 81  80    AST 0 - 40 IU/L 18  21    ALT 0 - 44 IU/L 12  13      Lipid Panel     Component Value Date/Time   CHOL 102 06/23/2022 0902   TRIG 36 06/23/2022 0902   HDL 60 06/23/2022 0902   CHOLHDL 1.7 06/23/2022 0902   CHOLHDL 1.8 05/12/2019 0317   VLDL 7 05/12/2019 0317   LDLCALC 32 06/23/2022 0902   LABVLDL 10 06/23/2022 0902    Lab Results  Component Value Date   HGBA1C 5.3 04/29/2019     Assessment and Plan: 1. Penile discharge He will come into the office to obtain the specimen - Cytology (oral, anal, urethral) ancillary only  2. Testicular mass - US SCROTUM W/DOPPLER; Future  3. Dysuria Will need to obtain a UA and urine culture - Urine Culture   Follow Up Instructions: Keep previously scheduled appointment.   I discussed the assessment and treatment plan with the patient. The patient was provided an opportunity to ask questions and all were answered. The patient agreed with the plan and demonstrated an understanding of the instructions.   The patient was advised to call back or seek an in-person evaluation if the symptoms worsen or if the condition fails to improve as anticipated.     I provided 14 minutes total of Telehealth time during this encounter including median intraservice time, reviewing previous notes, investigations, ordering medications, medical decision making, coordinating care and patient verbalized understanding at the end of the visit.     Charlott Rakes, MD, FAAFP. [facility] and [place], [place]   07/08/2022, 8:48 AM

## 2022-07-09 ENCOUNTER — Encounter: Payer: Self-pay | Admitting: Family Medicine

## 2022-07-10 LAB — URINE CULTURE: Organism ID, Bacteria: NO GROWTH

## 2022-07-18 ENCOUNTER — Telehealth: Payer: Self-pay | Admitting: Critical Care Medicine

## 2022-07-18 NOTE — Telephone Encounter (Signed)
Find out what his home bp reading have been and bring him in for in office bp check and report to me

## 2022-07-21 NOTE — Telephone Encounter (Signed)
Called patient and left voicemail.

## 2022-07-27 ENCOUNTER — Encounter: Payer: Self-pay | Admitting: Critical Care Medicine

## 2022-07-27 ENCOUNTER — Telehealth: Payer: Self-pay | Admitting: Critical Care Medicine

## 2022-07-27 NOTE — Telephone Encounter (Signed)
Pt called to report his BP readings/ Yesterday morning BP 132/72 and this morning 130/74/ please advise

## 2022-07-27 NOTE — Telephone Encounter (Signed)
Tell patient thank you for giving his blood pressure reading reports I will see him at the next appointment and these readings are doing well stay on his medication as he is taking

## 2022-07-29 ENCOUNTER — Ambulatory Visit
Admission: RE | Admit: 2022-07-29 | Discharge: 2022-07-29 | Disposition: A | Discharge status: Home / Self Care | Payer: 59 | Source: Ambulatory Visit | Attending: Family Medicine | Admitting: Family Medicine

## 2022-07-29 DIAGNOSIS — N5089 Other specified disorders of the male genital organs: Secondary | ICD-10-CM

## 2022-07-30 NOTE — Telephone Encounter (Signed)
Called patient and he is aware of note

## 2022-08-11 ENCOUNTER — Encounter: Payer: Self-pay | Admitting: Sports Medicine

## 2022-08-11 ENCOUNTER — Ambulatory Visit (INDEPENDENT_AMBULATORY_CARE_PROVIDER_SITE_OTHER): Payer: 59 | Admitting: Sports Medicine

## 2022-08-11 DIAGNOSIS — M4722 Other spondylosis with radiculopathy, cervical region: Secondary | ICD-10-CM

## 2022-08-11 DIAGNOSIS — M542 Cervicalgia: Secondary | ICD-10-CM | POA: Diagnosis not present

## 2022-08-11 DIAGNOSIS — M25512 Pain in left shoulder: Secondary | ICD-10-CM

## 2022-08-11 DIAGNOSIS — G8929 Other chronic pain: Secondary | ICD-10-CM

## 2022-08-11 DIAGNOSIS — M19012 Primary osteoarthritis, left shoulder: Secondary | ICD-10-CM | POA: Diagnosis not present

## 2022-08-11 NOTE — Progress Notes (Signed)
Doing better; states that when he over works it (at work) it stays sore for a few days.  Does do HEP, and his job keeps it moving  Denies any OTC medication for pain

## 2022-08-11 NOTE — Progress Notes (Signed)
[name] - 62 y.o. male MRN [id number]  Date of birth: 07/21/1960  Office Visit Note: Visit Date: 08/11/2022 PCP: Elsie Stain, MD Referred by: Elsie Stain, MD  Subjective: Chief Complaint  Patient presents with   Left Shoulder - Follow-up   HPI: [name] is a pleasant 62 y.o. male who presents today for follow-up of left shoulder pain and neck pain with LUE radiculopathy.  Left shoulder -still feeling better after the ultrasound-guided glenohumeral joint injection 04/19/2022.  He will have some soreness within the shoulder after working for long periods of time, but overall still improved.  Continue with home exercise program a few times a week.  Neck -continues with intermittent neck pain.  However more bothersome for [name] today is that he is having worsening of his radicular symptoms down the left upper extremity into fingers 3 through 5.  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  He denies any weakness of grip strength or dropping tools.  He has been taking gabapentin 300 mg nightly -has not taken it twice daily dosing yet.  His pain is more so worse in the evening or at night.  Pertinent ROS were reviewed with the patient and found to be negative unless otherwise specified above in HPI.   Assessment & Plan: Visit Diagnoses:  1. Osteoarthritis of spine with radiculopathy, cervical region   2. Neck pain   3. Glenohumeral arthritis, left   4. Chronic pain in left shoulder    Plan: Discussed with [name] today regarding his neck, that I do feel he is dealing with cervical radiculopathy, I am more concerned that his symptoms are getting worse instead of improving.  We will increase his gabapentin from 300 mg once daily to twice daily.  Irregardless, I think we need a cervical MRI to evaluate for any neural impingement, this was ordered today.  In terms of his shoulder, he has good strength and is  found good benefit from the prior injection.  He will continue his home rehab plan and we will consider repeat injection if for some reason this worsens in the future.  He may take over-the-counter anti-inflammatories, needs to avoid NSAIDs given his history of MI.  We will follow-up 3 business days after cervical MRI to discuss next steps.  Follow-up: Return for f/u 3 business days after MRI.   Meds & Orders: No orders of the defined types were placed in this encounter.   Orders Placed This Encounter  Procedures   MR Cervical Spine w/o contrast     Procedures: No procedures performed      Clinical History: No specialty comments available.  He reports that he has been smoking cigarettes. He has been smoking an average of 1.5 packs per day. He has never used smokeless tobacco. No results for input(s): "HGBA1C", "LABURIC" in the last 8760 hours.  Objective:    Physical Exam  Gen: Well-appearing, in no acute distress; non-toxic CV: Regular Rate. Well-perfused. Warm.  Resp: Breathing unlabored on room air; no wheezing. Psych: Fluid speech in conversation; appropriate affect; normal thought process Neuro: Sensation intact throughout. No gross coordination deficits.   Ortho Exam - Cervical: There is no midline spinous process TTP, some mild restriction with endrange extension, full range flexion.  There is relative straightening of the normal cervical lordotic curve.  There is a positive Spurling's test on the left.  5/5 strength in upper  extremities in the C5-T1 nerve root distribution bilaterally.  Negative Tinel's at the carpal tunnel.  - Left shoulder: No overlying skin changes or effusion.  There is no bony tenderness to palpation or AC joint TTP.  He has some mild restriction in endrange extension with about 10 degrees less of active and passive ER compared to the contralateral shoulder.  Rotator cuff testing appears intact without any gross deficits.  Imaging:  2 views of the  cervical spine including AP and lateral femoral ordered and  reviewed by myself.  X-rays demonstrate flattening of the normal cervical  lordosis.  There is cervical DJD from C3-C7.  Anterior spurring most  notably at C5 and C6.  There is mild disc space loss between C6 and C7.   No acute fracture noted.  No significant facet arthropathy.   Past Medical/Family/Surgical/Social History: Medications & Allergies reviewed per EMR, new medications updated. 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   Spinal stenosis at L4-L5 level 09/22/2016   AV block    Thiamine deficiency 11/19/2015   Depression with somatization 11/19/2015   Primary osteoarthritis of first carpometacarpal joint of left hand 10/23/2015   Chronic renal insufficiency, stage II (mild) 07/02/2014   Coronary artery disease involving native coronary artery of native heart with angina pectoris ([place])    Apical variant hypertrophic cardiomyopathy ([place]) 10/18/2013   Migraine, unspecified, without mention of intractable migraine without mention of status migrainosus 03/26/2013   GERD (gastroesophageal reflux disease) 11/14/2012   Routine general medical examination at a health care facility 07/28/2012   ERECTILE DYSFUNCTION 08/12/2010   Hyperlipidemia with target LDL less than 70 10/15/2009   TOBACCO USE 10/15/2009   Essential hypertension 10/15/2009   MYOCARDIAL INFARCTION, HX OF 10/15/2009   Past Medical History:  Diagnosis Date   Apical variant hypertrophic cardiomyopathy ([place])    CAD (coronary artery disease)    a. LHC in 2011 with severe dz in D1/D2 and very distal LAD--> too small for PCI and managed medically.  b.  LHC in 10/2013 after an abnormal nuclear study w/ non-obst dz/c. 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   HOH (hard of hearing)    rgiht ear   Hyperlipidemia    Hypertension    Migraine headache    Stroke [facility])    Family History  Problem Relation Age of Onset   Hypertension Mother    Heart failure Mother    Hypertension Father    Colon cancer Father 68   Colon cancer Paternal Uncle    Past Surgical History:  Procedure Laterality Date   CARDIAC CATHETERIZATION N/A 02/24/2016   Procedure: Left Heart Cath and Coronary Angiography;  Surgeon: Sherren Mocha, MD;  Location: [place] [facility];  Service: Cardiovascular;  Laterality: N/A;   CARDIAC CATHETERIZATION N/A 02/24/2016   Procedure: Coronary Stent Intervention;  Surgeon: Sherren Mocha, MD;  Location: [place] [facility];  Service: Cardiovascular;  Laterality: N/A;  CORONARY ANGIOPLASTY WITH STENT PLACEMENT  2017   CORONARY BALLOON ANGIOPLASTY N/A 04/30/2019   Procedure: CORONARY BALLOON ANGIOPLASTY;  Surgeon: Martinique, Peter M, MD;  Location: [place] [facility];  Service: Cardiovascular;  Laterality: N/A;   INGUINAL HERNIA REPAIR     LEFT HEART CATH AND CORONARY ANGIOGRAPHY N/A 04/30/2019   Procedure: LEFT HEART CATH AND CORONARY ANGIOGRAPHY;  Surgeon: Jolaine Artist, MD;  Location: [place] [facility];  Service: Cardiovascular;  Laterality: N/A;   LEFT HEART CATH AND CORONARY ANGIOGRAPHY N/A 05/14/2019   Procedure: LEFT HEART CATH AND CORONARY ANGIOGRAPHY;  Surgeon: Martinique, Peter M, MD;  Location: [place] [facility];  Service: Cardiovascular;  Laterality: N/A;   LEFT HEART CATHETERIZATION WITH CORONARY ANGIOGRAM N/A 11/22/2013   Procedure: LEFT HEART CATHETERIZATION WITH CORONARY ANGIOGRAM;  Surgeon: [name], MD;  Location: [facility]  CATH LAB;  Service: Cardiovascular;  Laterality: N/A;   TEE WITHOUT CARDIOVERSION N/A 08/22/2015   Procedure: TRANSESOPHAGEAL ECHOCARDIOGRAM (TEE);  Surgeon: Thayer Headings, MD;  Location: [facility] ENDOSCOPY;  Service: Cardiovascular;  Laterality: N/A;   Social History   Occupational History   Not on file  Tobacco Use   Smoking status: Some Days    Packs/day: 1.50    Types: Cigarettes   Smokeless tobacco: Never  Vaping Use   Vaping Use: Never used  Substance and Sexual Activity   Alcohol use: No    Alcohol/week: 0.0 standard drinks of alcohol   Drug use: No   Sexual activity: Not Currently

## 2022-08-21 ENCOUNTER — Ambulatory Visit
Admission: RE | Admit: 2022-08-21 | Discharge: 2022-08-21 | Disposition: A | Discharge status: Home / Self Care | Payer: 59 | Source: Ambulatory Visit | Attending: Sports Medicine | Admitting: Sports Medicine

## 2022-08-21 DIAGNOSIS — M542 Cervicalgia: Secondary | ICD-10-CM

## 2022-08-25 ENCOUNTER — Ambulatory Visit: Payer: 59 | Attending: Internal Medicine | Admitting: Internal Medicine

## 2022-08-25 ENCOUNTER — Encounter: Payer: Self-pay | Admitting: Internal Medicine

## 2022-08-25 ENCOUNTER — Ambulatory Visit (INDEPENDENT_AMBULATORY_CARE_PROVIDER_SITE_OTHER): Payer: 59

## 2022-08-25 VITALS — BP 130/78 | HR 60 | Ht 68.0 in | Wt 170.0 lb

## 2022-08-25 DIAGNOSIS — R002 Palpitations: Secondary | ICD-10-CM

## 2022-08-25 DIAGNOSIS — I25119 Atherosclerotic heart disease of native coronary artery with unspecified angina pectoris: Secondary | ICD-10-CM

## 2022-08-25 DIAGNOSIS — I422 Other hypertrophic cardiomyopathy: Secondary | ICD-10-CM

## 2022-08-25 LAB — CBC
Hematocrit: 36.3 % — ABNORMAL LOW (ref 37.5–51.0)
Hemoglobin: 12 g/dL — ABNORMAL LOW (ref 13.0–17.7)
MCH: 29.6 pg (ref 26.6–33.0)
MCHC: 33.1 g/dL (ref 31.5–35.7)
MCV: 90 fL (ref 79–97)
Platelets: 288 10*3/uL (ref 150–450)
RBC: 4.05 x10E6/uL — ABNORMAL LOW (ref 4.14–5.80)
RDW: 11.9 % (ref 11.6–15.4)
WBC: 4.9 10*3/uL (ref 3.4–10.8)

## 2022-08-25 MED ORDER — NITROGLYCERIN 0.4 MG SL SUBL: 0.4000 mg | SUBLINGUAL_TABLET | SUBLINGUAL | 3 refills | Status: DC | PRN

## 2022-08-25 NOTE — Patient Instructions (Addendum)
Medication Instructions:  Your physician recommends that you continue on your current medications as directed. Please refer to the Current Medication list given to you today. REFILLED: Nitroglycerin  *If you need a refill on your cardiac medications before your next appointment, please call your pharmacy*   Lab Work: TODAY: CBC  If you have labs (blood work) drawn today and your tests are completely normal, you will receive your results only by: [place] (if you have MyChart) OR A paper copy in the mail If you have any lab test that is abnormal or we need to change your treatment, we will call you to review the results.   Testing/Procedures: Your physician has requested that you have a cardiac MRI. Cardiac MRI uses a computer to create images of your heart as its beating, producing both still and moving pictures of your heart and major blood vessels. For further information please visit [email or web address] Please follow the instruction sheet given to you today for more information.   Your physician has requested that you wear a heart monitor.  Your physician has referred you to a Dietitian.    Follow-Up: At [facility], you and your health needs are our priority.  As part of our continuing mission to provide you with exceptional heart care, we have created designated Provider Care Teams.  These Care Teams include your primary Cardiologist (physician) and Advanced Practice Providers (APPs -  Physician Assistants and Nurse Practitioners) who all work together to provide you with the care you need, when you need it.  We recommend signing up for the patient portal called "MyChart".  Sign up information is provided on this After Visit Summary.  MyChart is used to connect with patients for Virtual Visits (Telemedicine).  Patients are able to view lab/test results, encounter notes, upcoming appointments, etc.  Non-urgent messages can be sent to your provider as well.    To learn more about what you can do with MyChart, go to [email or web address].    Your next appointment:   4-5 month(s)  Provider:   Werner Lean, MD     Other Instructions   You are scheduled for Cardiac MRI on ______________. Please arrive for your appointment at ______________ ( arrive 30-45 minutes prior to test start time). ?  [facility] [address] [place], [place] [address] [phone] Please take advantage of the free valet parking available at the MAIN entrance (A entrance).  Proceed to the [facility] Radiology Department (First Floor) for check-in.   [facility] [place], [place] [address] [phone] Please take advantage of the free valet parking available at the MAIN entrance. Proceed to [facility] registration for check-in (first floor).  Magnetic resonance imaging (MRI) is a painless test that produces images of the inside of the body without using Xrays.  During an MRI, strong magnets and radio waves work together in a [employer], political party to form detailed images.   MRI images may provide more details about a medical condition than X-rays, CT scans, and ultrasounds can provide.  You may be given earphones to listen for instructions.  You may eat a light breakfast and take medications as ordered with the exception of furosemide, hydrochlorothiazide, or spironolactone- Losartan-hydrochlorothiazide (Hyzaar) (fluid pill, other). Please avoid stimulants for 12 hr prior to test. (Ie. Caffeine, nicotine, chocolate, or antihistamine medications)  If a contrast material will be used, an IV will be inserted into one of your veins. Contrast material  will be injected into your IV. It will leave your body through your urine within a day. You may be told to drink plenty of fluids to help flush the contrast material out of your system.  You will be asked to remove all metal, including: Watch, jewelry, and  other metal objects including hearing aids, hair pieces and dentures. Also wearable glucose monitoring systems (ie. Freestyle Libre and Omnipods) (Braces and fillings normally are not a problem.)   TEST WILL TAKE APPROXIMATELY 1 HOUR  PLEASE NOTIFY SCHEDULING AT LEAST 24 HOURS IN ADVANCE IF YOU ARE UNABLE TO KEEP YOUR APPOINTMENT. [phone]  Please call Marchia Bond, cardiac imaging nurse navigator with any questions/concerns. Marchia Bond RN Navigator Cardiac Imaging Gordy Clement RN Navigator Cardiac Imaging [facility] [phone] Office     HEART MONITOR ZIO XT- Long Term Monitor Instructions  Your physician has requested you wear a ZIO patch monitor for 7 days.  This is a single patch monitor. [employer] supplies one patch monitor per enrollment. Additional stickers are not available. Please do not apply patch if you will be having a Nuclear Stress Test,   Cardiac CT, MRI, or Chest Xray during the period you would be wearing the  monitor. The patch cannot be worn during these tests. You cannot remove and re-apply the  ZIO XT patch monitor.  Your ZIO patch monitor will be mailed 3 day USPS to your address on file. It may take 3-5 days  to receive your monitor after you have been enrolled.  Once you have received your monitor, please review the enclosed instructions. Your monitor  has already been registered assigning a specific monitor serial # to you.  Billing and Patient Assistance Program Information  We have supplied [employer] with any of your insurance information on file for billing purposes. [employer] offers a sliding scale Patient Assistance Program for patients that do not have  insurance, or whose insurance does not completely cover the cost of the ZIO monitor.  You must apply for the Patient Assistance Program to qualify for this discounted rate.  To apply, please call [employer] at [phone], select option 4, select option 2, ask to apply  for  Patient Assistance Program. Theodore Demark will ask your household income, and how many people  are in your household. They will quote your out-of-pocket cost based on that information.  [employer] will also be able to set up a 31-month, interest-free payment plan if needed.  Applying the monitor   Shave hair from upper left chest.  Hold abrader disc by orange tab. Rub abrader in 40 strokes over the upper left chest as  indicated in your monitor instructions.  Clean area with 4 enclosed alcohol pads. Let dry.  Apply patch as indicated in monitor instructions. Patch will be placed under collarbone on left  side of chest with arrow pointing upward.  Rub patch adhesive wings for 2 minutes. Remove white label marked "1". Remove the white  label marked "2". Rub patch adhesive wings for 2 additional minutes.  While looking in a mirror, press and release button in center of patch. A small green light will  flash 3-4 times. This will be your only indicator that the monitor has been turned on.  Do not shower for the first 24 hours. You may shower after the first 24 hours.  Press the button if you feel a symptom. You will hear a small click. Record Date, Time and  Symptom in the Patient Logbook.  When  you are ready to remove the patch, follow instructions on the last 2 pages of Patient  Logbook. Stick patch monitor onto the last page of Patient Logbook.  Place Patient Logbook in the blue and white box. Use locking tab on box and tape box closed  securely. The blue and white box has prepaid postage on it. Please place it in the mailbox as  soon as possible. Your physician should have your test results approximately 7 days after the  monitor has been mailed back to [facility].  Call [place] at [phone] if you have questions regarding  your ZIO XT patch monitor. Call them immediately if you see an orange light blinking on your  monitor.  If your monitor falls off in less than  4 days, contact our Monitor department at [phone].  If your monitor becomes loose or falls off after 4 days call [employer] at [phone] for  suggestions on securing your monitor

## 2022-08-25 NOTE — Progress Notes (Unsigned)
Enrolled patient for a 7 day Zio XT monitor to be mailed to patients home.  

## 2022-08-25 NOTE — Progress Notes (Signed)
Cardiology Office Note:    Date:  08/25/2022   ID:  [name], DOB [birth year], MRN [id number]  PCP:  Elsie Stain, MD  Cardiologist:  Werner Lean, MD   Referring MD: Elsie Stain, MD   CC: Apical HCM Consulted for the evaluation of Apical HCM at the behest of Dr. Tamala Julian  History of Present Illness:    [name] is a 62 y.o. male with a hx of  CAD (cutting balloon angioplasty of the ramus intermedius on 04/30/2019), apical variant hypertrophic cardiomyopathy (no aneurysm, maximall apical thickness 19 mm by CT in 2020) hyperlipidemia (on rosuvastatin and Repatha), hypertension, tobacco abuse, prior CVA and migraine headaches  Seen by Dr. Tamala Julian with concerns of HCM related microvascular disease  Patient notes that he is doing well.   Since last visit notes that he has no chest pain . There are no interval hospital/ED visit.   He is working to cut back smoking  No chest pain or pressure .  No SOB/DOE and no PND/Orthopnea.  No weight gain or leg swelling.  No palpitations or syncope.  He has four brothers, two sons, and two daughters.  He has 18 grand children.  No Fhx of SCD.  No hx of HCM; his son lives in [place] and has new "heart trouble."    Past Medical History:  Diagnosis Date   Apical variant hypertrophic cardiomyopathy ([place])    CAD (coronary artery disease)    a. LHC in 2011 with severe dz in D1/D2 and very distal LAD--> too small for PCI and managed medically.  b.  LHC in 10/2013 after an abnormal nuclear study w/ non-obst dz/c. 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   HOH (hard of hearing)    rgiht ear    Hyperlipidemia    Hypertension    Migraine headache    Stroke [facility])     Past Surgical History:  Procedure Laterality Date   CARDIAC CATHETERIZATION N/A 02/24/2016   Procedure: Left Heart Cath and Coronary Angiography;  Surgeon: Sherren Mocha, MD;  Location: [place] CV LAB;  Service: Cardiovascular;  Laterality: N/A;   CARDIAC CATHETERIZATION N/A 02/24/2016   Procedure: Coronary Stent Intervention;  Surgeon: Sherren Mocha, MD;  Location: [place] [facility];  Service: Cardiovascular;  Laterality: N/A;   CORONARY ANGIOPLASTY WITH STENT PLACEMENT  2017   CORONARY BALLOON ANGIOPLASTY N/A 04/30/2019   Procedure: CORONARY BALLOON ANGIOPLASTY;  Surgeon: Martinique, Peter M, MD;  Location: [place] [facility] LAB;  Service: Cardiovascular;  Laterality: N/A;   INGUINAL HERNIA REPAIR     LEFT HEART CATH AND CORONARY ANGIOGRAPHY N/A 04/30/2019   Procedure: LEFT HEART CATH AND CORONARY ANGIOGRAPHY;  Surgeon: Jolaine Artist, MD;  Location: [place] [facility];  Service: Cardiovascular;  Laterality: N/A;   LEFT HEART CATH AND CORONARY ANGIOGRAPHY N/A 05/14/2019   Procedure: LEFT HEART CATH AND CORONARY ANGIOGRAPHY;  Surgeon: Martinique, Peter M, MD;  Location: [place] [facility];  Service: Cardiovascular;  Laterality: N/A;   LEFT HEART CATHETERIZATION WITH CORONARY ANGIOGRAM N/A 11/22/2013   Procedure: LEFT HEART CATHETERIZATION WITH CORONARY ANGIOGRAM;  Surgeon: [name], MD;  Location: [facility] CATH LAB;  Service: Cardiovascular;  Laterality: N/A;   TEE WITHOUT CARDIOVERSION N/A 08/22/2015   Procedure: TRANSESOPHAGEAL ECHOCARDIOGRAM (TEE);  Surgeon: Thayer Headings, MD;  Location: [facility] ENDOSCOPY;  Service: Cardiovascular;  Laterality: N/A;    Current Medications: Current Meds  Medication Sig   amLODipine (NORVASC) 10 MG tablet Take 1 tablet (10 mg total) by mouth daily.   clopidogrel (PLAVIX) 75 MG tablet TAKE 1 TABLET (75 MG TOTAL) BY MOUTH DAILY.   Evolocumab (REPATHA SURECLICK) [id number] MG/ML SOAJ Inject 1  Pen into the skin every 14 (fourteen) days.   gabapentin (NEURONTIN) 300 MG capsule Take 1 capsule (300 mg total) by mouth 2 (two) times daily.   losartan-hydrochlorothiazide (HYZAAR) 100-25 MG tablet TAKE 1 TABLET BY MOUTH DAILY.   naproxen (NAPROSYN) 500 MG tablet Take by mouth as needed for moderate pain.   nicotine polacrilex (NICORETTE MINI) 4 MG lozenge Use 1-3 daily to quit smoking   pyridOXINE (VITAMIN B6) 100 MG tablet Take 1 tablet (100 mg total) by mouth daily.   ranolazine (RANEXA) 500 MG 12 hr tablet TAKE 1 TABLET (500 MG TOTAL) BY MOUTH 2 (TWO) TIMES DAILY.   rosuvastatin (CRESTOR) 40 MG tablet TAKE 1 TABLET (40 MG TOTAL) BY MOUTH DAILY.   [DISCONTINUED] nitroGLYCERIN (NITROSTAT) 0.4 MG SL tablet Place 1 tablet (0.4 mg total) under the tongue every 5 (five) minutes as needed for chest pain.     Allergies:   Lisinopril   Social History   Socioeconomic History   Marital status: Married    Spouse name: Not on file   Number of children: Not on file   Years of education: Not on file   Highest education level: Not on file  Occupational History   Not on file  Tobacco Use   Smoking status: Some Days    Packs/day: 1.5    Types: Cigarettes   Smokeless tobacco: Never  Vaping Use   Vaping Use: Never used  Substance and Sexual Activity   Alcohol use: No    Alcohol/week: 0.0 standard drinks of alcohol   Drug use: No   Sexual activity: Not Currently  Other Topics Concern   Not on file  Social History Narrative   Not on file   Social Determinants of Health   Financial Resource Strain: Not on file  Food Insecurity: Not on file  Transportation Needs: Not on file  Physical Activity: Not on file  Stress: Not on file  Social Connections: Not on file    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  ROS:   Please see the history of present illness.      EKGs/Labs/Other Studies Reviewed:    Cardiac Studies & Procedures   CARDIAC CATHETERIZATION  CARDIAC CATHETERIZATION 05/14/2019  Narrative  Mid LAD lesion is 30% stenosed.  Dist LAD lesion is 90% stenosed.  1st Diag lesion is 100% stenosed.  Ramus lesion is 25% stenosed.  Lat Ramus lesion is 90% stenosed.  Mid Cx lesion is 70% stenosed.  Lat 2nd Mrg lesion is 99% stenosed.  Prox RCA to Mid RCA lesion is 20% stenosed.  RPAV lesion is 35% stenosed.  LV end diastolic pressure is normal.  1. CAD. Patient has multiple obstructive lesions  in small branches including distal LAD, first diagonal, sub-branch of the ramus intermediate, and sub-branch of OM2. The ramus intermediate stent is still widely patent at prior angioplasty site. There is a moderate stenosis in the mid to distal LCx that involves a bifurcation. 2. Normal LVEDP  Plan: I do not see any good targets for PCI and feel his symptoms are consistent with his small vessel disease. I would recommend aggressive anti-anginal medical therapy.  Findings Coronary Findings Diagnostic  Dominance: Right  Left Anterior Descending Mid LAD lesion is 30% stenosed. Dist LAD lesion is 90% stenosed.  First Diagonal Branch 1st Diag lesion is 100% stenosed.  Ramus Intermedius Ramus lesion is 25% stenosed. The lesion was previously treated using a drug eluting stent over 2 years ago.  Lateral Ramus Intermedius Lat Ramus lesion is 90% stenosed.  Left Circumflex Mid Cx lesion is 70% stenosed.  Lateral Second Obtuse Marginal Branch Lat 2nd Mrg lesion is 99% stenosed.  Right Coronary Artery Vessel is normal in caliber. There is mild diffuse disease throughout the vessel. The vessel is moderately tortuous. Prox RCA to Mid RCA lesion is 20% stenosed.  Right Posterior Atrioventricular Artery RPAV lesion is 35%  stenosed.  Intervention  No interventions have been documented.   CARDIAC CATHETERIZATION  CARDIAC CATHETERIZATION 04/30/2019  Narrative  Dist LAD lesion is 90% stenosed.  1st Diag lesion is 100% stenosed.  Ramus lesion is 90% stenosed.  Post intervention, there is a 25% residual stenosis.  1. Successful cutting balloon angioplasty of the ramus intermediate artery for in stent restenosis.  Plan; DAPT for one year for ACS indication. If patient has  restenosis again could consider repeat stenting.  Findings Coronary Findings Diagnostic  Dominance: Right  Left Anterior Descending Dist LAD lesion is 90% stenosed.  First Diagonal Branch 1st Diag lesion is 100% stenosed.  Ramus Intermedius Ramus lesion is 90% stenosed. The lesion was previously treated using a drug eluting stent over 2 years ago.  Right Coronary Artery Vessel was injected. Vessel is normal in caliber. There is mild diffuse disease throughout the vessel. The vessel is moderately tortuous.  Intervention  Ramus lesion Angioplasty Post-Intervention Lesion Assessment The intervention was successful. Pre-interventional TIMI flow is 3. Post-intervention TIMI flow is 3. No complications occurred at this lesion. There is a 25% residual stenosis post intervention.   STRESS TESTS  NM MYOCAR MULTI W/SPECT W 06/21/2010  Narrative Clinical Data:  Chest pain  Technique:  Standard myocardial SPECT imaging performed after resting intravenous injection of 10 mCi Tc-56m tetrofosmin . Subsequently, intravenous infusion of Lexiscan performed under the supervision of the Cardiology staff.  At peak effect of the drug, 30 mCi Tc-78mLexiscan injected intravenously and standard myocardial SPECT imaging performed.  Quantitative gated imaging also performed to evaluate left ventricular wall motion and estimate left ventricular ejection fraction.  Comparison:  none  MYOCARDIAL IMAGING WITH SPECT (REST AND  PHARMACOLOGIC-STRESS)  Findings:  On the stress series there is abnormal decreased radiotracer uptake involving the mid and basilar segments of the anterior wall.  There is normal radiotracer uptake within this area on the rest images.  GATED LEFT VENTRICULAR WALL MOTION STUDY  Findings:  Review of the gated images demonstrates normal wall motion and thickening  LEFT VENTRICULAR EJECTION FRACTION  Findings:  QGS ejection fraction measures 58% , with an end- diastolic volume of 123XX123 ml and an end-systolic volume of 41 ml.  IMPRESSION:  1.  Abnormal pharmacologically induced myocardial reversibility involving the mid and basilar segments of  the anterior wall. 2.  Normal left ventricular systolic function 3.  Left ventricular ejection fraction equals 58%.  Provider: Early Osmond   ECHOCARDIOGRAM  ECHOCARDIOGRAM COMPLETE 04/29/2019  Narrative ECHOCARDIOGRAM REPORT    Patient Name:   SAJJAD BILLETT Date of Exam: 04/29/2019 Medical Rec #:  [id number]        Height:       68.0 in Accession #:    [id number]       Weight:       169.7 lb Date of Birth:  12-27-1960        BSA:          1.91 m Patient Age:    89 years         BP:           111/61 mmHg Patient Gender: M                HR:           65 bpm. Exam Location:  Inpatient  Procedure: 2D Echo  Indications:    NSTEMI I21.4  History:        Patient has prior history of Echocardiogram examinations, most recent 12/12/2018. Previous Myocardial Infarction and CAD; Risk Factors:Hypertension, Dyslipidemia and Current Smoker. Apical variant hypertrophic cardiomyopathy , Bradycardia.  Sonographer:    Leavy Cella Referring Phys: [id number] [place]   1. Left ventricular ejection fraction, by visual estimation, is 60 to 65%. The left ventricle has normal function. Left ventricular septal wall thickness was mildly increased. Mildly increased left ventricular posterior wall thickness. There is no  left ventricular hypertrophy. 2. Global right ventricle has normal systolic function.The right ventricular size is normal. No increase in right ventricular wall thickness. 3. Left atrial size was mildly dilated. 4. Right atrial size was normal. 5. The mitral valve is normal in structure. Trace mitral valve regurgitation. No evidence of mitral stenosis. 6. The tricuspid valve is normal in structure. Tricuspid valve regurgitation is not demonstrated. 7. The aortic valve was not well visualized. Aortic valve regurgitation is not visualized. No evidence of aortic valve sclerosis or stenosis. 8. The pulmonic valve was normal in structure. Pulmonic valve regurgitation is not visualized. 9. The inferior vena cava is normal in size with greater than 50% respiratory variability, suggesting right atrial pressure of 3 mmHg.  FINDINGS Left Ventricle: Left ventricular ejection fraction, by visual estimation, is 60 to 65%. The left ventricle has normal function. Mildly increased left ventricular posterior wall thickness. There is no left ventricular hypertrophy. Left ventricular diastolic parameters were normal. Normal left atrial pressure.  Right Ventricle: The right ventricular size is normal. No increase in right ventricular wall thickness. Global RV systolic function is has normal systolic function.  Left Atrium: Left atrial size was mildly dilated.  Right Atrium: Right atrial size was normal in size  Pericardium: There is no evidence of pericardial effusion.  Mitral Valve: The mitral valve is normal in structure. No evidence of mitral valve stenosis by observation. Trace mitral valve regurgitation.  Tricuspid Valve: The tricuspid valve is normal in structure. Tricuspid valve regurgitation is not demonstrated.  Aortic Valve: The aortic valve was not well visualized. Aortic valve regurgitation is not visualized. The aortic valve is structurally normal, with no evidence of sclerosis or  stenosis.  Pulmonic Valve: The pulmonic valve was normal in structure. Pulmonic valve regurgitation is not visualized.  Aorta: The aortic root, ascending aorta and aortic arch are all structurally normal, with no evidence of  dilitation or obstruction.  Venous: The inferior vena cava is normal in size with greater than 50% respiratory variability, suggesting right atrial pressure of 3 mmHg.  IAS/Shunts: No atrial level shunt detected by color flow Doppler. No ventricular septal defect is seen or detected. There is no evidence of an atrial septal defect.    LEFT VENTRICLE PLAX 2D LVIDd:         3.50 cm  Diastology LVIDs:         2.50 cm  LV e' lateral:   14.50 cm/s LV PW:         1.25 cm  LV E/e' lateral: 6.0 LV IVS:        1.20 cm  LV e' medial:    9.25 cm/s LVOT diam:     2.20 cm  LV E/e' medial:  9.4 LV SV:         29 ml LV SV Index:   14.77 LVOT Area:     3.80 cm   RIGHT VENTRICLE RV S prime:     16.90 cm/s TAPSE (M-mode): 2.2 cm  LEFT ATRIUM           Index       RIGHT ATRIUM           Index LA diam:      2.70 cm 1.42 cm/m  RA Area:     11.80 cm LA Vol (A2C): 38.9 ml 20.41 ml/m RA Volume:   24.60 ml  12.91 ml/m LA Vol (A4C): 63.1 ml 33.11 ml/m  AORTA Ao Root diam: 2.70 cm  MITRAL VALVE MV Area (PHT): 3.21 cm             SHUNTS MV PHT:        68.44 msec           Systemic Diam: 2.20 cm MV Decel Time: 236 msec MV E velocity: 86.50 cm/s 103 cm/s MV A velocity: 68.10 cm/s 70.3 cm/s MV E/A ratio:  1.27       1.5   Skeet Latch MD Electronically signed by Skeet Latch MD Signature Date/Time: 04/29/2019/4:24:34 PM    Final   TEE  ECHO TEE 08/22/2015  Narrative *[facility]* [address], [place] [address] [phone]  ------------------------------------------------------------------- Transesophageal Echocardiography  Patient:    Taigen, Gamboa MR #:       [id number] Study Date:  08/22/2015 Gender:     M Age:        14 Height:     172.7 cm Weight:     90 kg BSA:        2.1 m^2 Pt. Status: Room:  PERFORMING   Mertie Moores, M.D. ATTENDING    Wendi Maya T SONOGRAPHER  [facility] ADMITTING    Nahser, Jr  cc:  -------------------------------------------------------------------  ------------------------------------------------------------------- Indications:      424.1 Aortic valve disorders.  ------------------------------------------------------------------- History:   PMH:  CKD. Apical variant hypertrophic cardiomyopathy. Coronary artery disease.  Risk factors:  Current tobacco use. Hypertension. Dyslipidemia.  ------------------------------------------------------------------- Study Conclusions  - Left ventricle: The cavity size was normal. Wall thickness was increased in a pattern of moderate LVH. Systolic function was normal. - Aortic valve: No evidence of vegetation. - Mitral valve: No evidence of vegetation. - Left atrium: No evidence of thrombus in the atrial cavity or appendage. - Atrial septum: No defect or patent foramen ovale was identified.  Diagnostic transesophageal echocardiography.  2D and color  Doppler. Birthdate:  Patient birthdate: [birth year].  Age:  Patient is 62 yr old.  Sex:  Gender: male.    BMI: 30.2 kg/m^2.  Blood pressure: 132/83  Patient status:  Outpatient.  Study date:  Study date: 08/22/2015. Study time: 01:35 PM.  Location:  Endoscopy.  -------------------------------------------------------------------  ------------------------------------------------------------------- Left ventricle:  The cavity size was normal. Wall thickness was increased in a pattern of moderate LVH. Systolic function was normal.  ------------------------------------------------------------------- Aortic valve:   Structurally normal valve.   Cusp separation was normal.  No evidence of vegetation.  Doppler:   There was no regurgitation.  ------------------------------------------------------------------- Aorta:  The aorta was mildly calcified.  ------------------------------------------------------------------- Mitral valve:   Structurally normal valve.   Leaflet separation was normal.  No evidence of vegetation.  Doppler:  There was trivial regurgitation.  ------------------------------------------------------------------- Left atrium:  The atrium was normal in size.  No evidence of thrombus in the atrial cavity or appendage.  ------------------------------------------------------------------- Atrial septum:  No defect or patent foramen ovale was identified.  ------------------------------------------------------------------- Post procedure conclusions Ascending Aorta:  - The aorta was mildly calcified.  ------------------------------------------------------------------- Prepared and Electronically Authenticated by  Mertie Moores, M.D. 2017-03-24T16:47:52   MONITORS  LONG TERM MONITOR (3-14 DAYS) 06/29/2019  Narrative  Normal sinus rhythm as the basic mechanism.  Occasional PVCs are noted. No PACs are noted.  Intermittent AV block, both type I and type II Mobitz are seen during sleep with pauses up to 5 seconds.  Nonsustained VT, 5 beats not associated with symptoms.  No atrial fibrillation or flutter.  Complaints correlate with both PACs and PVCs.             EKG:   08/25/22: SR with LVH suggestive of apical HCM  2022: demonstrates left ventricular hypertrophy with strain pattern unchanged from prior tracings.  Left atrial abnormality.  Normal sinus rhythm.  Recent Labs: 10/08/2021: Hemoglobin 12.7; Platelets 309 06/23/2022: ALT 12; BUN 12; Creatinine, Ser 1.58; Potassium 3.8; Sodium 140  Recent Lipid Panel    Component Value Date/Time   CHOL 102 06/23/2022 0902   TRIG 36 06/23/2022 0902   HDL 60 06/23/2022 0902   CHOLHDL 1.7 06/23/2022 0902   CHOLHDL 1.8  05/12/2019 0317   VLDL 7 05/12/2019 0317   LDLCALC 32 06/23/2022 0902    Physical Exam:    VS:  BP 130/78   Pulse 60   Ht 5\' 8"  (1.727 m)   Wt 170 lb (77.1 kg)   SpO2 100%   BMI 25.85 kg/m     Wt Readings from Last 3 Encounters:  08/25/22 170 lb (77.1 kg)  06/23/22 172 lb 12.8 oz (78.4 kg)  02/12/22 165 lb (74.8 kg)     GEN: Healthy weight and size. No acute distress HEENT: Normal NECK: No JVD. LYMPHATICS: No lymphadenopathy CARDIAC: No murmur. RRR S4 but no S3 gallop, or edema. VASCULAR:  Normal Pulses. No bruits. RESPIRATORY:  Clear to auscultation without rales, wheezing or rhonchi  ABDOMEN: Soft, non-tender, non-distended, No pulsatile mass, MUSCULOSKELETAL: No deformity  SKIN: Warm and dry NEUROLOGIC:  Alert and oriented x 3 PSYCHIATRIC:  Normal affect   ASSESSMENT:    1. Apical variant hypertrophic cardiomyopathy ([place])   2. Coronary artery disease involving native coronary artery of native heart with angina pectoris (HCC)   3. Palpitations    PLAN:    Hypertrophic Cardiomyopathy - Apical Variant - no aneurysm, maximal septal thickness 19 mm  - NYHA I; if sx ninerafaxstat would be reasonable   - Family  history reviewed, Discussed family screening  (Will plan for genetics referral; he will let us know if his son in [place] gets diagnosed and get genetic testing)  - will get CMR and ziopatch; at next visit will do SCD testing unless Class I indication found - absence of atrial fibrillation  - Will do exercise planning at next visit - Reviewed prior CT and did disease state education   Tobacco Abuse - discussed smoking cessation  Coronary Artery Disease; Obstructive - asymptomatic on current therapy - anatomy: Has diagonal, Ramus, and LCX territory obstructive disease - continue plavix 75 mg - continue statin and repatha, goal LDL < 55 - continue ranolazine - continue nitrates (PRN)  Time Spent Directly with Patient:   I have spent a total of 40  minutes with the patient reviewing notes, imaging, EKGs, labs and examining the patient as well as establishing an assessment and plan that was discussed personally with the patient.  > 50% of time was spent in direct patient care and reviewing imaging with patient.     Medication Adjustments/Labs and Tests Ordered: Current medicines are reviewed at length with the patient today.  Concerns regarding medicines are outlined above.  Orders Placed This Encounter  Procedures   MR CARDIAC MORPHOLOGY W WO CONTRAST   CBC   Ambulatory referral to [place] (3-14 DAYS)   EKG 12-Lead   Meds ordered this encounter  Medications   nitroGLYCERIN (NITROSTAT) 0.4 MG SL tablet    Sig: Place 1 tablet (0.4 mg total) under the tongue every 5 (five) minutes as needed for chest pain.    Dispense:  25 tablet    Refill:  3    Patient Instructions  Medication Instructions:  Your physician recommends that you continue on your current medications as directed. Please refer to the Current Medication list given to you today. REFILLED: Nitroglycerin  *If you need a refill on your cardiac medications before your next appointment, please call your pharmacy*   Lab Work: TODAY: CBC  If you have labs (blood work) drawn today and your tests are completely normal, you will receive your results only by: [place] (if you have MyChart) OR A paper copy in the mail If you have any lab test that is abnormal or we need to change your treatment, we will call you to review the results.   Testing/Procedures: Your physician has requested that you have a cardiac MRI. Cardiac MRI uses a computer to create images of your heart as its beating, producing both still and moving pictures of your heart and major blood vessels. For further information please visit [email or web address] Please follow the instruction sheet given to you today for more information.   Your physician has requested that you wear a  heart monitor.  Your physician has referred you to a Dietitian.    Follow-Up: At [facility], you and your health needs are our priority.  As part of our continuing mission to provide you with exceptional heart care, we have created designated Provider Care Teams.  These Care Teams include your primary Cardiologist (physician) and Advanced Practice Providers (APPs -  Physician Assistants and Nurse Practitioners) who all work together to provide you with the care you need, when you need it.  We recommend signing up for the patient portal called "MyChart".  Sign up information is provided on this After Visit Summary.  MyChart is used to connect with patients for Virtual Visits (Telemedicine).  Patients are able  to view lab/test results, encounter notes, upcoming appointments, etc.  Non-urgent messages can be sent to your provider as well.   To learn more about what you can do with MyChart, go to [email or web address].    Your next appointment:   4-5 month(s)  Provider:   Werner Lean, MD     Other Instructions   You are scheduled for Cardiac MRI on ______________. Please arrive for your appointment at ______________ ( arrive 30-45 minutes prior to test start time). ?  [facility] [address] [place], [place] [address] [phone] Please take advantage of the free valet parking available at the MAIN entrance (A entrance).  Proceed to the [facility] Radiology Department (First Floor) for check-in.   [facility] [address] [place], [place] [address] [phone] Please take advantage of the free valet parking available at the MAIN entrance. Proceed to [facility] registration for check-in (first floor).  Magnetic resonance imaging (MRI) is a painless test that produces images of the inside of the body without using Xrays.  During an MRI, strong magnets and radio waves work together in a Research officer, political party to  form detailed images.   MRI images may provide more details about a medical condition than X-rays, CT scans, and ultrasounds can provide.  You may be given earphones to listen for instructions.  You may eat a light breakfast and take medications as ordered with the exception of furosemide, hydrochlorothiazide, or spironolactone- Losartan-hydrochlorothiazide (Hyzaar) (fluid pill, other). Please avoid stimulants for 12 hr prior to test. (Ie. Caffeine, nicotine, chocolate, or antihistamine medications)  If a contrast material will be used, an IV will be inserted into one of your veins. Contrast material will be injected into your IV. It will leave your body through your urine within a day. You may be told to drink plenty of fluids to help flush the contrast material out of your system.  You will be asked to remove all metal, including: Watch, jewelry, and other metal objects including hearing aids, hair pieces and dentures. Also wearable glucose monitoring systems (ie. Freestyle Libre and Omnipods) (Braces and fillings normally are not a problem.)   TEST WILL TAKE APPROXIMATELY 1 HOUR  PLEASE NOTIFY SCHEDULING AT LEAST 24 HOURS IN ADVANCE IF YOU ARE UNABLE TO KEEP YOUR APPOINTMENT. [phone]  Please call Marchia Bond, cardiac imaging nurse navigator with any questions/concerns. Marchia Bond RN Navigator Cardiac Imaging Gordy Clement RN Navigator Cardiac Imaging [facility] [phone] Office     HEART MONITOR ZIO XT- Long Term Monitor Instructions  Your physician has requested you wear a ZIO patch monitor for 7 days.  This is a single patch monitor. [employer] supplies one patch monitor per enrollment. Additional stickers are not available. Please do not apply patch if you will be having a Nuclear Stress Test,   Cardiac CT, MRI, or Chest Xray during the period you would be wearing the  monitor. The patch cannot be worn during these tests. You cannot remove  and re-apply the  ZIO XT patch monitor.  Your ZIO patch monitor will be mailed 3 day USPS to your address on file. It may take 3-5 days  to receive your monitor after you have been enrolled.  Once you have received your monitor, please review the enclosed instructions. Your monitor  has already been registered assigning a specific monitor serial # to you.  Billing and Patient Assistance Program Information  We have supplied [employer]  with any of your insurance information on file for billing purposes. [employer] offers a sliding scale Patient Assistance Program for patients that do not have  insurance, or whose insurance does not completely cover the cost of the ZIO monitor.  You must apply for the Patient Assistance Program to qualify for this discounted rate.  To apply, please call [employer] at [phone], select option 4, select option 2, ask to apply for  Patient Assistance Program. Theodore Demark will ask your household income, and how many people  are in your household. They will quote your out-of-pocket cost based on that information.  [employer] will also be able to set up a 12-month, interest-free payment plan if needed.  Applying the monitor   Shave hair from upper left chest.  Hold abrader disc by orange tab. Rub abrader in 40 strokes over the upper left chest as  indicated in your monitor instructions.  Clean area with 4 enclosed alcohol pads. Let dry.  Apply patch as indicated in monitor instructions. Patch will be placed under collarbone on left  side of chest with arrow pointing upward.  Rub patch adhesive wings for 2 minutes. Remove white label marked "1". Remove the white  label marked "2". Rub patch adhesive wings for 2 additional minutes.  While looking in a mirror, press and release button in center of patch. A small green light will  flash 3-4 times. This will be your only indicator that the monitor has been turned on.  Do not shower for the first 24 hours. You may shower after  the first 24 hours.  Press the button if you feel a symptom. You will hear a small click. Record Date, Time and  Symptom in the Patient Logbook.  When you are ready to remove the patch, follow instructions on the last 2 pages of Patient  Logbook. Stick patch monitor onto the last page of Patient Logbook.  Place Patient Logbook in the blue and white box. Use locking tab on box and tape box closed  securely. The blue and white box has prepaid postage on it. Please place it in the mailbox as  soon as possible. Your physician should have your test results approximately 7 days after the  monitor has been mailed back to [facility].  Call [place] at [phone] if you have questions regarding  your ZIO XT patch monitor. Call them immediately if you see an orange light blinking on your  monitor.  If your monitor falls off in less than 4 days, contact our Monitor department at [phone].  If your monitor becomes loose or falls off after 4 days call [employer] at [phone] for  suggestions on securing your monitor      Signed, Werner Lean, MD  08/25/2022 9:36 AM    [place]

## 2022-08-28 DIAGNOSIS — I422 Other hypertrophic cardiomyopathy: Secondary | ICD-10-CM

## 2022-08-28 DIAGNOSIS — I25119 Atherosclerotic heart disease of native coronary artery with unspecified angina pectoris: Secondary | ICD-10-CM

## 2022-08-28 DIAGNOSIS — R002 Palpitations: Secondary | ICD-10-CM

## 2022-09-20 ENCOUNTER — Telehealth: Payer: Self-pay | Admitting: Internal Medicine

## 2022-09-20 DIAGNOSIS — I25119 Atherosclerotic heart disease of native coronary artery with unspecified angina pectoris: Secondary | ICD-10-CM

## 2022-09-20 NOTE — Telephone Encounter (Signed)
Abnormal results. Please advise 

## 2022-09-20 NOTE — Telephone Encounter (Signed)
   Cardiac Monitor Alert  Date of alert:  09/20/2022   Patient Name: [name]  DOB: 26-Mar-1961  MRN: [id number]   [facility] Cardiologist: Christell Constant, MD   [facility] EP:  None    Monitor Information: Long Term Monitor [ZioXT]  Reason:  Arthrosclerotic heart disease  Ordering provider:  Dr. Izora Ribas   Alert Supraventricular Tachycardia - fastest HR:  133    Pauses 22 episodes  This is the 1st alert for this rhythm.   Next Cardiology Appointment   Date:  12/20/22  Provider:  Dr. Izora Ribas  The patient could NOT be reached by telephone today.  Left voicemail message for the patient to call the clinic. Arrhythmia, symptoms and history reviewed with Dr. Mayford Knife.   Plan:  In lab sleep study   Other: Keep scheduled appointments with cardiology  Asencion Gowda, LPN  11/27/5282 13:24 AM

## 2022-09-20 NOTE — Telephone Encounter (Addendum)
Dr. Mayford Knife will like for this patient to be scheduled for in lab sleep study.  Order has been placed.

## 2022-09-28 NOTE — Telephone Encounter (Signed)
Prior Authorization for SPLIT NIGHT sent to [facility] via web portal. Tracking Number . DENIED:NOT APPROVED:NO SLEEP SURVEY PROVIDED, NO DOCUMENTED CO-MORBIDITY-LUNG DISEASE, CHF.SNORING,DAYTIME TIREDNESS.

## 2022-10-07 ENCOUNTER — Telehealth: Payer: Self-pay | Admitting: Internal Medicine

## 2022-10-07 ENCOUNTER — Other Ambulatory Visit (HOSPITAL_COMMUNITY): Payer: Self-pay

## 2022-10-07 ENCOUNTER — Telehealth: Payer: Self-pay

## 2022-10-07 NOTE — Telephone Encounter (Signed)
Pharmacy Patient Advocate Encounter  Prior Authorization for [facility] has been approved.    Effective dates: 10/07/22 through 10/07/23      Received notification from [facility] that prior authorization for REPATHA is needed.    PA submitted on 10/07/22 Key [id number] Status is pending  Haze Rushing, CPhT Pharmacy Patient Advocate Specialist Direct Number: [phone] Fax: [phone]

## 2022-10-07 NOTE — Telephone Encounter (Signed)
  Pt c/o medication issue:  1. Name of Medication: Evolocumab (REPATHA SURECLICK) 140 MG/ML SOAJ   2. How are you currently taking this medication (dosage and times per day)? Inject 1 Pen into the skin every 14 (fourteen) days.   3. Are you having a reaction (difficulty breathing--STAT)? No   4. What is your medication issue? Pt's wife said, pt has new Brewing technologist and they need prior auth, [facility] Address: [address] [place], [place], [place] [address] Phone: [phone]

## 2022-10-07 NOTE — Telephone Encounter (Signed)
PA initiated, please see separate encounter for updates on determination. (I will route you back in once a decision has been made)  Aleesha Ringstad, CPhT Pharmacy Patient Advocate Specialist Direct Number: [phone] Fax: [phone]  

## 2022-10-08 MED ORDER — REPATHA SURECLICK 140 MG/ML ~~LOC~~ SOAJ: 140.0000 mg | SUBCUTANEOUS | 3 refills | Status: DC

## 2022-10-08 NOTE — Telephone Encounter (Signed)
Refill sent in

## 2022-10-08 NOTE — Addendum Note (Signed)
Addended by: Karely Hurtado E on: 10/08/2022 11:46 AM   Modules accepted: Orders

## 2022-10-20 ENCOUNTER — Encounter: Payer: Self-pay | Admitting: Critical Care Medicine

## 2022-10-20 ENCOUNTER — Ambulatory Visit: Payer: 59 | Attending: Critical Care Medicine | Admitting: Critical Care Medicine

## 2022-10-20 VITALS — BP 121/70 | HR 59 | Temp 98.2°F | Ht 68.0 in | Wt 163.0 lb

## 2022-10-20 DIAGNOSIS — M19011 Primary osteoarthritis, right shoulder: Secondary | ICD-10-CM

## 2022-10-20 DIAGNOSIS — M25511 Pain in right shoulder: Secondary | ICD-10-CM | POA: Diagnosis not present

## 2022-10-20 DIAGNOSIS — I2510 Atherosclerotic heart disease of native coronary artery without angina pectoris: Secondary | ICD-10-CM | POA: Insufficient documentation

## 2022-10-20 DIAGNOSIS — Z1211 Encounter for screening for malignant neoplasm of colon: Secondary | ICD-10-CM

## 2022-10-20 DIAGNOSIS — G8929 Other chronic pain: Secondary | ICD-10-CM | POA: Insufficient documentation

## 2022-10-20 DIAGNOSIS — Z760 Encounter for issue of repeat prescription: Secondary | ICD-10-CM | POA: Insufficient documentation

## 2022-10-20 DIAGNOSIS — F17210 Nicotine dependence, cigarettes, uncomplicated: Secondary | ICD-10-CM | POA: Insufficient documentation

## 2022-10-20 DIAGNOSIS — F17200 Nicotine dependence, unspecified, uncomplicated: Secondary | ICD-10-CM

## 2022-10-20 DIAGNOSIS — I129 Hypertensive chronic kidney disease with stage 1 through stage 4 chronic kidney disease, or unspecified chronic kidney disease: Secondary | ICD-10-CM | POA: Diagnosis not present

## 2022-10-20 DIAGNOSIS — N182 Chronic kidney disease, stage 2 (mild): Secondary | ICD-10-CM | POA: Diagnosis not present

## 2022-10-20 DIAGNOSIS — I25119 Atherosclerotic heart disease of native coronary artery with unspecified angina pectoris: Secondary | ICD-10-CM | POA: Diagnosis not present

## 2022-10-20 DIAGNOSIS — Z800 Family history of malignant neoplasm of digestive organs: Secondary | ICD-10-CM | POA: Insufficient documentation

## 2022-10-20 DIAGNOSIS — Z79899 Other long term (current) drug therapy: Secondary | ICD-10-CM | POA: Insufficient documentation

## 2022-10-20 DIAGNOSIS — Z23 Encounter for immunization: Secondary | ICD-10-CM | POA: Diagnosis not present

## 2022-10-20 DIAGNOSIS — Z955 Presence of coronary angioplasty implant and graft: Secondary | ICD-10-CM | POA: Diagnosis not present

## 2022-10-20 DIAGNOSIS — I10 Essential (primary) hypertension: Secondary | ICD-10-CM

## 2022-10-20 DIAGNOSIS — I422 Other hypertrophic cardiomyopathy: Secondary | ICD-10-CM | POA: Insufficient documentation

## 2022-10-20 DIAGNOSIS — I252 Old myocardial infarction: Secondary | ICD-10-CM | POA: Diagnosis not present

## 2022-10-20 MED ORDER — GABAPENTIN 300 MG PO CAPS
300.0000 mg | ORAL_CAPSULE | Freq: Two times a day (BID) | ORAL | 1 refills | Status: DC
Start: 2022-10-20 — End: 2023-12-12

## 2022-10-20 MED ORDER — CEFDINIR 300 MG PO CAPS: 300.0000 mg | ORAL_CAPSULE | Freq: Two times a day (BID) | ORAL | 0 refills | Status: AC

## 2022-10-20 NOTE — Assessment & Plan Note (Signed)
As per cardiology

## 2022-10-20 NOTE — Assessment & Plan Note (Signed)
Continue medication including the amlodipine losartan HCT for blood pressure

## 2022-10-20 NOTE — Assessment & Plan Note (Signed)
Resolved

## 2022-10-20 NOTE — Patient Instructions (Signed)
Referral for colonoscopy Tetanus shot given Take cefdinir twice daily for 7 days Medications are refilled  Return 6 months

## 2022-10-20 NOTE — Assessment & Plan Note (Signed)
  .   Current smoking consumption amount: 1 pack a day  . Dicsussion on advise to quit smoking and smoking impacts: Cardiovascular impacts  . Patient's willingness to quit: Ready to quit  Methods to quit smoking discussed: Nicotine replacement . Medication management of smoking session drugs discussed: Nicotine replacement  . Resources provided:  AVS   . Setting quit date 10 days  . Follow-up arranged 1 month   Time spent counseling the patient: 5 minutes   

## 2022-10-20 NOTE — Assessment & Plan Note (Signed)
Continue with statin therapy with Crestor and Repatha

## 2022-10-20 NOTE — Progress Notes (Signed)
Established Patient Office Visit  Subjective   Patient ID: [name], male    DOB: [birth year]  Age: 62 y.o. MRN: [id number]  Chief Complaint  Patient presents with   Hypertension    HTN f/u.  Cough, green phlegm X3 weeks    01/2022 Patient returns in follow-up has not been seen since September of last year.  He has history of hypertension and on arrival blood pressure is 136/78 this is what he runs at home.  He is still smoking a pack a day of cigarettes.  He does complain of right chronic shoulder pain as well.  He saw cardiology in April of this year with no changes.  10/20/22 Patient seen in return follow-up has not been seen since September 2023.  He has had a 3-day history of increased cough productive of thick green mucus he is smoking a pack every 3 days of cigarettes.  Patient also needs a colonoscopy as last 1 was 10 years ago.  On arrival blood pressure is good 120/70.  He maintains medication.  He follows with cardiology and did have palpitations Zio patch showed intermittent SVT no atrial fibrillation therefore he is not receiving an anticoagulant.  He needs refills on multiple medications.    Patient Active Problem List   Diagnosis Date Noted   Palpitations 08/25/2022   Rising PSA level 03/06/2019   Family history of colon cancer 10/19/2018   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   Spinal stenosis at L4-L5 level 09/22/2016   AV block    Thiamine deficiency 11/19/2015   Depression with somatization 11/19/2015   Primary osteoarthritis of first carpometacarpal joint of left hand 10/23/2015   Chronic renal insufficiency, stage II (mild) 07/02/2014   Coronary artery disease involving native coronary artery of native heart with angina pectoris (HCC)    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/2013   Migraine, unspecified, without mention of intractable migraine without mention of status migrainosus 03/26/2013   GERD (gastroesophageal reflux  disease) 11/14/2012   Routine general medical examination at a health care facility 07/28/2012   ERECTILE DYSFUNCTION 08/12/2010   Hyperlipidemia with target LDL less than 70 10/15/2009   TOBACCO USE 10/15/2009   Essential hypertension 10/15/2009   MYOCARDIAL INFARCTION, HX OF 10/15/2009   Past Medical History:  Diagnosis Date   AC (acromioclavicular) arthritis 11/23/2018   Injected November 23, 2018   Apical variant hypertrophic cardiomyopathy (HCC)    CAD (coronary artery disease)    a. LHC in 2011 with severe dz in D1/D2 and very distal LAD--> too small for PCI and managed medically.  b.  LHC in 10/2013 after an abnormal nuclear study w/ non-obst dz/c. 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   HOH (hard of hearing)    rgiht ear   Hyperlipidemia    Hypertension    Migraine headache    Stroke [facility])    Past Surgical History:  Procedure Laterality Date   CARDIAC CATHETERIZATION N/A 02/24/2016   Procedure: Left Heart Cath and Coronary Angiography;  Surgeon: Tonny Bollman, MD;  Location: [facility] INVASIVE CV LAB;  Service: Cardiovascular;  Laterality: N/A;  CARDIAC CATHETERIZATION N/A 02/24/2016   Procedure: Coronary Stent Intervention;  Surgeon: Tonny Bollman, MD;  Location: [facility] INVASIVE CV LAB;  Service: Cardiovascular;  Laterality: N/A;   CORONARY ANGIOPLASTY WITH STENT PLACEMENT  2017   CORONARY BALLOON ANGIOPLASTY N/A 04/30/2019   Procedure: CORONARY BALLOON ANGIOPLASTY;  Surgeon: Swaziland, Peter M, MD;  Location: [facility] INVASIVE CV LAB;  Service: Cardiovascular;  Laterality: N/A;   INGUINAL HERNIA REPAIR     LEFT HEART CATH AND CORONARY ANGIOGRAPHY N/A 04/30/2019    Procedure: LEFT HEART CATH AND CORONARY ANGIOGRAPHY;  Surgeon: Dolores Patty, MD;  Location: [facility] INVASIVE CV LAB;  Service: Cardiovascular;  Laterality: N/A;   LEFT HEART CATH AND CORONARY ANGIOGRAPHY N/A 05/14/2019   Procedure: LEFT HEART CATH AND CORONARY ANGIOGRAPHY;  Surgeon: Swaziland, Peter M, MD;  Location: [facility] INVASIVE CV LAB;  Service: Cardiovascular;  Laterality: N/A;   LEFT HEART CATHETERIZATION WITH CORONARY ANGIOGRAM N/A 11/22/2013   Procedure: LEFT HEART CATHETERIZATION WITH CORONARY ANGIOGRAM;  Surgeon: [name], MD;  Location: [facility] CATH LAB;  Service: Cardiovascular;  Laterality: N/A;   TEE WITHOUT CARDIOVERSION N/A 08/22/2015   Procedure: TRANSESOPHAGEAL ECHOCARDIOGRAM (TEE);  Surgeon: Vesta Mixer, MD;  Location: [facility] ENDOSCOPY;  Service: Cardiovascular;  Laterality: N/A;   Social History   Tobacco Use   Smoking status: Some Days    Packs/day: 1.5    Types: Cigarettes   Smokeless tobacco: Never  Vaping Use   Vaping Use: Never used  Substance Use Topics   Alcohol use: No    Alcohol/week: 0.0 standard drinks of alcohol   Drug use: No   Social History   Socioeconomic History   Marital status: Married    Spouse name: Not on file   Number of children: Not on file   Years of education: Not on file   Highest education level: Not on file  Occupational History   Not on file  Tobacco Use   Smoking status: Some Days    Packs/day: 1.5    Types: Cigarettes   Smokeless tobacco: Never  Vaping Use   Vaping Use: Never used  Substance and Sexual Activity   Alcohol use: No    Alcohol/week: 0.0 standard drinks of alcohol   Drug use: No   Sexual activity: Not Currently  Other Topics Concern   Not on file  Social History Narrative   Not on file   Social Determinants of Health   Financial Resource Strain: Not on file  Food Insecurity: Not on file  Transportation Needs: Not on file  Physical Activity: Not on file  Stress: Not on file  Social Connections: Not  on file  Intimate Partner Violence: Not on file   Family Status  Relation Name Status   Mother  Alive   Father  Alive   Oneal Grout  (Not Specified)   MGM  Deceased   MGF  Deceased   PGM  Deceased   PGF  Deceased   Family History  Problem Relation Age of Onset   Hypertension Mother    Heart failure Mother    Hypertension Father    Colon cancer Father 50   Colon cancer Paternal Uncle    Allergies  Allergen Reactions   Lisinopril Swelling    Facial swelling      Review of Systems  Constitutional:  Negative for chills, diaphoresis, fever, malaise/fatigue and weight loss.       No appt  HENT:  Negative for congestion, hearing loss, nosebleeds, sore throat and  tinnitus.   Eyes:  Negative for blurred vision, photophobia and redness.  Respiratory:  Negative for cough, hemoptysis, sputum production, shortness of breath, wheezing and stridor.   Cardiovascular:  Negative for chest pain, palpitations, orthopnea, claudication, leg swelling and PND.  Gastrointestinal:  Negative for abdominal pain, blood in stool, constipation, diarrhea, heartburn, nausea and vomiting.  Genitourinary:  Negative for dysuria, flank pain, frequency, hematuria and urgency.  Musculoskeletal:  Negative for back pain, falls, joint pain, myalgias and neck pain.       Knot on shoulder Right  Skin:  Negative for itching and rash.  Neurological:  Negative for dizziness, tingling, tremors, sensory change, speech change, focal weakness, seizures, loss of consciousness, weakness and headaches.  Endo/Heme/Allergies:  Negative for environmental allergies and polydipsia. Does not bruise/bleed easily.  Psychiatric/Behavioral:  Negative for depression, memory loss, substance abuse and suicidal ideas. The patient is not nervous/anxious and does not have insomnia.       Objective:     BP 121/70 (BP Location: Left Arm, Patient Position: Sitting, Cuff Size: Normal)   Pulse (!) 59   Temp 98.2 F (36.8 C) (Oral)   Ht 5'  8" (1.727 m)   Wt 163 lb (73.9 kg)   SpO2 100%   BMI 24.78 kg/m  BP Readings from Last 3 Encounters:  10/20/22 121/70  08/25/22 130/78  07/27/22 130/74   Wt Readings from Last 3 Encounters:  10/20/22 163 lb (73.9 kg)  08/25/22 170 lb (77.1 kg)  06/23/22 172 lb 12.8 oz (78.4 kg)      Physical Exam Vitals reviewed.  Constitutional:      Appearance: Normal appearance. He is well-developed and normal weight. He is not diaphoretic.  HENT:     Head: Normocephalic and atraumatic.     Nose: No nasal deformity, septal deviation, mucosal edema or rhinorrhea.     Right Sinus: No maxillary sinus tenderness or frontal sinus tenderness.     Left Sinus: No maxillary sinus tenderness or frontal sinus tenderness.     Mouth/Throat:     Pharynx: No oropharyngeal exudate.  Eyes:     General: No scleral icterus.    Conjunctiva/sclera: Conjunctivae normal.     Pupils: Pupils are equal, round, and reactive to light.  Neck:     Thyroid: No thyromegaly.     Vascular: No carotid bruit or JVD.     Trachea: Trachea normal. No tracheal tenderness or tracheal deviation.  Cardiovascular:     Rate and Rhythm: Normal rate and regular rhythm.     Chest Wall: PMI is not displaced.     Pulses: Normal pulses. No decreased pulses.     Heart sounds: Normal heart sounds, S1 normal and S2 normal. Heart sounds not distant. No murmur heard.    No systolic murmur is present.     No diastolic murmur is present.     No friction rub. No gallop. No S3 or S4 sounds.  Pulmonary:     Effort: No tachypnea, accessory muscle usage or respiratory distress.     Breath sounds: No stridor. No decreased breath sounds, wheezing, rhonchi or rales.  Chest:     Chest wall: No tenderness.  Abdominal:     General: Bowel sounds are normal. There is no distension.     Palpations: Abdomen is soft. Abdomen is not rigid.     Tenderness: There is no abdominal tenderness. There is no guarding or rebound.  Musculoskeletal:         General: No  swelling or tenderness. Normal range of motion.     Cervical back: Normal range of motion and neck supple. No edema, erythema or rigidity. No muscular tenderness. Normal range of motion.  Lymphadenopathy:     Head:     Right side of head: No submental or submandibular adenopathy.     Left side of head: No submental or submandibular adenopathy.     Cervical: No cervical adenopathy.  Skin:    General: Skin is warm and dry.     Coloration: Skin is not pale.     Findings: No rash.     Nails: There is no clubbing.  Neurological:     Mental Status: He is alert and oriented to person, place, and time.     Sensory: No sensory deficit.  Psychiatric:        Speech: Speech normal.        Behavior: Behavior normal.      No results found for any visits on 10/20/22.  Last CBC Lab Results  Component Value Date   WBC 4.9 08/25/2022   HGB 12.0 (L) 08/25/2022   HCT 36.3 (L) 08/25/2022   MCV 90 08/25/2022   MCH 29.6 08/25/2022   RDW 11.9 08/25/2022   PLT 288 08/25/2022   Last metabolic panel Lab Results  Component Value Date   GLUCOSE 81 06/23/2022   NA 140 06/23/2022   K 3.8 06/23/2022   CL 102 06/23/2022   CO2 22 06/23/2022   BUN 12 06/23/2022   CREATININE 1.58 (H) 06/23/2022   EGFR 50 (L) 06/23/2022   CALCIUM 9.5 06/23/2022   PHOS 3.5 09/17/2009   PROT 7.1 06/23/2022   ALBUMIN 4.6 06/23/2022   LABGLOB 2.5 06/23/2022   AGRATIO 1.8 06/23/2022   BILITOT 0.6 06/23/2022   ALKPHOS 81 06/23/2022   AST 18 06/23/2022   ALT 12 06/23/2022   ANIONGAP 10 03/16/2021   Last lipids Lab Results  Component Value Date   CHOL 102 06/23/2022   HDL 60 06/23/2022   LDLCALC 32 06/23/2022   TRIG 36 06/23/2022   CHOLHDL 1.7 06/23/2022   Last hemoglobin A1c Lab Results  Component Value Date   HGBA1C 5.3 04/29/2019      The ASCVD Risk score (Arnett DK, et al., 2019) failed to calculate for the following reasons:   The patient has a prior MI or stroke diagnosis     Assessment & Plan:   Problem List Items Addressed This Visit       Cardiovascular and Mediastinum   Essential hypertension (Chronic)    Continue medication including the amlodipine losartan HCT for blood pressure      Apical variant hypertrophic cardiomyopathy (HCC) (Chronic)    As per cardiology      Coronary artery disease involving native coronary artery of native heart with angina pectoris (HCC)    Continue with statin therapy with Crestor and Repatha        Musculoskeletal and Integument   RESOLVED: AC (acromioclavicular) arthritis    Resolved        Other   TOBACCO USE (Chronic)       Current smoking consumption amount: 1 pack a day  Dicsussion on advise to quit smoking and smoking impacts: Cardiovascular impacts  Patient's willingness to quit: Ready to quit  Methods to quit smoking discussed: Nicotine replacement Medication management of smoking session drugs discussed: Nicotine replacement  Resources provided:  AVS   Setting quit date 10 days  Follow-up arranged 1 month   Time  spent counseling the patient: 5 minutes        Family history of colon cancer    Referral for colonoscopy      Other Visit Diagnoses     Colon cancer screening    -  Primary   Relevant Orders   Ambulatory referral to Gastroenterology   Chronic right shoulder pain       Relevant Medications   gabapentin (NEURONTIN) 300 MG capsule     Patient agreed and received tetanus vaccine Return in about 6 months (around 04/22/2023) for htn.    Shan Levans, MD

## 2022-10-20 NOTE — Assessment & Plan Note (Signed)
Referral for colonoscopy °

## 2022-11-01 ENCOUNTER — Encounter: Payer: Self-pay | Admitting: Gastroenterology

## 2022-11-09 ENCOUNTER — Telehealth (HOSPITAL_COMMUNITY): Payer: Self-pay | Admitting: Emergency Medicine

## 2022-11-09 NOTE — Telephone Encounter (Signed)
Reaching out to patient to offer assistance regarding upcoming cardiac imaging study; pt verbalizes understanding of appt date/time, parking situation and where to check in, pre-test NPO status and medications ordered, and verified current allergies; name and call back number provided for further questions should they arise Ammara Raj RN Navigator Cardiac Imaging [facility] [phone] office [phone] cell 

## 2022-11-10 ENCOUNTER — Ambulatory Visit (HOSPITAL_COMMUNITY)
Admission: RE | Admit: 2022-11-10 | Discharge: 2022-11-10 | Disposition: A | Discharge status: Home / Self Care | Payer: 59 | Source: Ambulatory Visit | Attending: Internal Medicine | Admitting: Internal Medicine

## 2022-11-10 ENCOUNTER — Other Ambulatory Visit: Payer: Self-pay | Admitting: Internal Medicine

## 2022-11-10 DIAGNOSIS — I25119 Atherosclerotic heart disease of native coronary artery with unspecified angina pectoris: Secondary | ICD-10-CM | POA: Diagnosis not present

## 2022-11-10 DIAGNOSIS — I422 Other hypertrophic cardiomyopathy: Secondary | ICD-10-CM | POA: Insufficient documentation

## 2022-11-10 DIAGNOSIS — R002 Palpitations: Secondary | ICD-10-CM | POA: Diagnosis not present

## 2022-11-10 MED ORDER — GADOBUTROL 1 MMOL/ML IV SOLN
7.0000 mL | Freq: Once | INTRAVENOUS | Status: AC | PRN
  Administered 2022-11-10: 7 mL via INTRAVENOUS

## 2022-11-10 MED ORDER — GADOBUTROL 1 MMOL/ML IV SOLN: 7.0000 mL | Freq: Once | INTRAVENOUS | Status: DC | PRN

## 2022-12-03 ENCOUNTER — Encounter: Payer: Self-pay | Admitting: Genetic Counselor

## 2022-12-07 ENCOUNTER — Ambulatory Visit: Payer: 59 | Attending: Genetic Counselor | Admitting: Genetic Counselor

## 2022-12-20 ENCOUNTER — Ambulatory Visit: Payer: 59 | Admitting: Internal Medicine

## 2023-01-17 NOTE — Progress Notes (Unsigned)
Cardiology Office Note:    Date:  01/18/2023   ID:  Quillen Pon, DOB 1960/06/30, MRN [id number]  PCP:  Storm Frisk, MD  Cardiologist:  Christell Constant, MD   Referring MD: Storm Frisk, MD   CC: Apical HCM  History of Present Illness:    [name] is a 62 y.o. male with a hx of  CAD (cutting balloon angioplasty of the ramus intermedius on 04/30/2019), apical variant hypertrophic cardiomyopathy (no aneurysm, maximall apical thickness 19 mm by CT in 2020) hyperlipidemia (on rosuvastatin and Repatha), hypertension, tobacco abuse, prior CVA and migraine headaches  Seen by Dr. Katrinka Blazing with concerns of HCM related microvascular disease 2024: Cut back smoking. NYHA I. He has four brothers, two sons, and two daughters.  He has 18 grandchildren.  No prior screening .  CMR suggestive of Apical HCM by 2023 imaging criteria.  Son pending genetic testing- unclear.   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 Physical job.  Notes no fatigue. Notes no palpitations Notes no CP. Notes rare standing dizziness. Notes no syncope. No PRN nitroglycerin needed.   Notable family events include  - no deaths; sons and daughters are doing ok. - brother are doing ok.  - no one has been screened for hypertrophic cardiomyopathy.    Past Medical History:  Diagnosis Date   AC (acromioclavicular) arthritis 11/23/2018   Injected November 23, 2018   Apical variant hypertrophic cardiomyopathy (HCC)    CAD (coronary artery disease)    a. LHC in 2011 with severe dz in D1/D2 and very distal LAD--> too small for PCI and managed medically.  b.  LHC in 10/2013 after an abnormal nuclear study w/ non-obst dz/c. 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   HOH (hard of hearing)    rgiht ear   Hyperlipidemia    Hypertension    Migraine headache    Stroke [facility])     Past Surgical History:  Procedure Laterality Date   CARDIAC CATHETERIZATION N/A 02/24/2016   Procedure: Left Heart Cath and Coronary Angiography;  Surgeon: Tonny Bollman, MD;  Location: [facility] INVASIVE CV LAB;  Service: Cardiovascular;  Laterality: N/A;   CARDIAC CATHETERIZATION N/A 02/24/2016   Procedure: Coronary Stent Intervention;  Surgeon: Tonny Bollman, MD;  Location: [facility] INVASIVE CV LAB;  Service: Cardiovascular;  Laterality: N/A;   CORONARY ANGIOPLASTY WITH STENT PLACEMENT  2017   CORONARY BALLOON ANGIOPLASTY N/A 04/30/2019   Procedure: CORONARY BALLOON ANGIOPLASTY;  Surgeon: Swaziland, Peter M, MD;  Location: [facility] INVASIVE CV LAB;  Service: Cardiovascular;  Laterality: N/A;   INGUINAL HERNIA REPAIR     LEFT HEART CATH AND CORONARY ANGIOGRAPHY N/A 04/30/2019   Procedure: LEFT HEART CATH AND CORONARY ANGIOGRAPHY;  Surgeon: Dolores Patty, MD;  Location: [facility] INVASIVE CV LAB;  Service: Cardiovascular;  Laterality: N/A;   LEFT HEART CATH AND CORONARY ANGIOGRAPHY N/A 05/14/2019   Procedure: LEFT HEART CATH AND CORONARY ANGIOGRAPHY;  Surgeon: Swaziland, Peter M, MD;  Location: [facility] INVASIVE CV LAB;  Service: Cardiovascular;  Laterality: N/A;   LEFT  HEART CATHETERIZATION WITH CORONARY ANGIOGRAM N/A 11/22/2013   Procedure: LEFT HEART CATHETERIZATION WITH CORONARY ANGIOGRAM;  Surgeon: [name], MD;  Location: [facility] CATH LAB;  Service: Cardiovascular;  Laterality: N/A;   TEE WITHOUT CARDIOVERSION N/A 08/22/2015   Procedure: TRANSESOPHAGEAL ECHOCARDIOGRAM (TEE);  Surgeon: Vesta Mixer, MD;  Location: [facility] ENDOSCOPY;  Service: Cardiovascular;  Laterality: N/A;    Current Medications: Current Meds  Medication Sig   amLODipine (NORVASC) 10 MG  tablet Take 1 tablet (10 mg total) by mouth daily.   clopidogrel (PLAVIX) 75 MG tablet TAKE 1 TABLET (75 MG TOTAL) BY MOUTH DAILY.   Evolocumab (REPATHA SURECLICK) 140 MG/ML SOAJ Inject 140 mg into the skin every 14 (fourteen) days.   gabapentin (NEURONTIN) 300 MG capsule Take 1 capsule (300 mg total) by mouth 2 (two) times daily.   losartan-hydrochlorothiazide (HYZAAR) 100-25 MG tablet TAKE 1 TABLET BY MOUTH DAILY.   naproxen (NAPROSYN) 500 MG tablet Take by mouth as needed for moderate pain.   nicotine polacrilex (NICORETTE MINI) 4 MG lozenge Use 1-3 daily to quit smoking   nitroGLYCERIN (NITROSTAT) 0.4 MG SL tablet Place 1 tablet (0.4 mg total) under the tongue every 5 (five) minutes as needed for chest pain.   pyridOXINE (VITAMIN B6) 100 MG tablet Take 1 tablet (100 mg total) by mouth daily.   ranolazine (RANEXA) 500 MG 12 hr tablet TAKE 1 TABLET (500 MG TOTAL) BY MOUTH 2 (TWO) TIMES DAILY.   rosuvastatin (CRESTOR) 40 MG tablet TAKE 1 TABLET (40 MG TOTAL) BY MOUTH DAILY.     Allergies:   Lisinopril   Social History   Socioeconomic History   Marital status: Married    Spouse name: Not on file   Number of children: Not on file   Years of education: Not on file   Highest education level: Not on file  Occupational History   Not on file  Tobacco Use   Smoking status: Some Days    Current packs/day: 1.50    Types: Cigarettes   Smokeless tobacco: Never  Vaping Use   Vaping status: Never Used  Substance and Sexual Activity   Alcohol use: No    Alcohol/week: 0.0 standard drinks of alcohol   Drug use: No   Sexual activity: Not Currently  Other Topics Concern   Not on file  Social History Narrative   Not on file   Social Determinants of Health   Financial Resource Strain: Not on file  Food Insecurity: Not on file  Transportation Needs: Not on file  Physical Activity: Not on file  Stress: Not on file  Social Connections: Not on file    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  ROS:   Please see the history of present illness.     EKGs/Labs/Other Studies Reviewed:    Cardiac Studies & Procedures   CARDIAC CATHETERIZATION  CARDIAC CATHETERIZATION 05/14/2019  Narrative  Mid LAD lesion is 30% stenosed.  Dist LAD lesion is 90% stenosed.  1st Diag lesion is 100% stenosed.  Ramus lesion is 25% stenosed.  Lat Ramus lesion is 90% stenosed.  Mid Cx lesion is 70% stenosed.  Lat 2nd Mrg lesion is 99% stenosed.  Prox RCA to Mid RCA lesion is 20% stenosed.  RPAV lesion is 35%  stenosed.  LV end diastolic pressure is normal.  1. CAD. Patient has multiple obstructive lesions in small branches including distal LAD, first diagonal, sub-branch of the ramus intermediate, and sub-branch of OM2. The ramus intermediate stent is still widely patent at prior angioplasty site. There is a moderate stenosis in the mid to distal LCx that involves a bifurcation. 2. Normal LVEDP  Plan: I do not see any good targets for PCI and feel his symptoms are consistent with his small vessel disease. I would recommend aggressive anti-anginal medical therapy.  Findings Coronary Findings Diagnostic  Dominance: Right  Left Anterior Descending Mid LAD lesion is 30% stenosed. Dist LAD lesion is 90% stenosed.  First Diagonal Branch 1st Diag lesion is 100% stenosed.  Ramus Intermedius Ramus lesion is 25% stenosed. The lesion was previously treated using a drug eluting stent over 2 years ago.  Lateral Ramus Intermedius Lat Ramus lesion is 90% stenosed.  Left Circumflex Mid Cx lesion is 70% stenosed.  Lateral Second Obtuse Marginal Branch Lat 2nd Mrg lesion is 99% stenosed.  Right Coronary Artery Vessel is normal in caliber. There is mild  diffuse disease throughout the vessel. The vessel is moderately tortuous. Prox RCA to Mid RCA lesion is 20% stenosed.  Right Posterior Atrioventricular Artery RPAV lesion is 35% stenosed.  Intervention  No interventions have been documented.   CARDIAC CATHETERIZATION  CARDIAC CATHETERIZATION 04/30/2019  Narrative  Dist LAD lesion is 90% stenosed.  1st Diag lesion is 100% stenosed.  Ramus lesion is 90% stenosed.  Post intervention, there is a 25% residual stenosis.  1. Successful cutting balloon angioplasty of the ramus intermediate artery for in stent restenosis.  Plan; DAPT for one year for ACS indication. If patient has  restenosis again could consider repeat stenting.  Findings Coronary Findings Diagnostic  Dominance: Right  Left Anterior Descending Dist LAD lesion is 90% stenosed.  First Diagonal Branch 1st Diag lesion is 100% stenosed.  Ramus Intermedius Ramus lesion is 90% stenosed. The lesion was previously treated using a drug eluting stent over 2 years ago.  Right Coronary Artery Vessel was injected. Vessel is normal in caliber. There is mild diffuse disease throughout the vessel. The vessel is moderately tortuous.  Intervention  Ramus lesion Angioplasty Post-Intervention Lesion Assessment The intervention was successful. Pre-interventional TIMI flow is 3. Post-intervention TIMI flow is 3. No complications occurred at this lesion. There is a 25% residual stenosis post intervention.   STRESS TESTS  NM MYOCAR MULTI W/SPECT W 09/21/2009  Narrative Clinical Data:  Chest pain  Technique:  Standard myocardial SPECT imaging performed after resting intravenous injection of 10 mCi Tc-61m tetrofosmin . Subsequently, intravenous infusion of Lexiscan performed under the supervision of the Cardiology staff.  At peak effect of the drug, 30 mCi Tc-17mLexiscan injected intravenously and standard myocardial SPECT imaging performed.  Quantitative gated  imaging also performed to evaluate left ventricular wall motion and estimate left ventricular ejection fraction.  Comparison:  none  MYOCARDIAL IMAGING WITH SPECT (REST AND PHARMACOLOGIC-STRESS)  Findings:  On the stress series there is abnormal decreased radiotracer uptake involving the mid and basilar segments of the anterior wall.  There is normal radiotracer uptake within this area on the rest images.  GATED LEFT VENTRICULAR WALL MOTION STUDY  Findings:  Review of the gated images demonstrates normal wall motion and thickening  LEFT VENTRICULAR EJECTION FRACTION  Findings:  QGS ejection fraction measures 58% , with an end- diastolic volume of 100 ml and an end-systolic volume of 41 ml.  IMPRESSION:  1.  Abnormal pharmacologically induced myocardial reversibility involving the mid and basilar segments of the anterior wall. 2.  Normal left ventricular systolic function 3.  Left ventricular ejection fraction equals 58%.  Provider: Jerrell Belfast   ECHOCARDIOGRAM  ECHOCARDIOGRAM COMPLETE 04/29/2019  Narrative ECHOCARDIOGRAM REPORT    Patient Name:   [name] Date of Exam: 04/29/2019 Medical Rec #:  [id number]        Height:       68.0 in Accession #:    [id number]       Weight:       169.7 lb Date of Birth:  1961/01/10        BSA:          1.91 m Patient Age:    58 years         BP:           111/61 mmHg Patient Gender: M                HR:           65 bpm. Exam Location:  Inpatient  Procedure: 2D Echo  Indications:    NSTEMI I21.4  History:        Patient has prior history of Echocardiogram examinations, most recent 12/12/2018. Previous Myocardial Infarction and CAD; Risk Factors:Hypertension, Dyslipidemia and Current Smoker. Apical variant hypertrophic cardiomyopathy , Bradycardia.  Sonographer:    Jeryl Columbia Referring Phys: [id number] Melrose Nakayama BODZIOCK  IMPRESSIONS   1. Left ventricular ejection fraction, by visual estimation, is 60 to  65%. The left ventricle has normal function. Left ventricular septal wall thickness was mildly increased. Mildly increased left ventricular posterior wall thickness. There is no left ventricular hypertrophy. 2. Global right ventricle has normal systolic function.The right ventricular size is normal. No increase in right ventricular wall thickness. 3. Left atrial size was mildly dilated. 4. Right atrial size was normal. 5. The mitral valve is normal in structure. Trace mitral valve regurgitation. No evidence of mitral stenosis. 6. The tricuspid valve is normal in structure. Tricuspid valve regurgitation is not demonstrated. 7. The aortic valve was not well visualized. Aortic valve regurgitation is not visualized. No evidence of aortic valve sclerosis or stenosis. 8. The pulmonic valve was normal in structure. Pulmonic valve regurgitation is not visualized. 9. The inferior vena cava is normal in size with greater than 50% respiratory variability, suggesting right atrial pressure of 3 mmHg.  FINDINGS Left Ventricle: Left ventricular ejection fraction, by visual estimation, is 60 to 65%. The left ventricle has normal function. Mildly increased left ventricular posterior wall thickness. There is no left ventricular hypertrophy. Left ventricular diastolic parameters were normal. Normal left atrial pressure.  Right Ventricle: The right ventricular size is normal. No increase in right ventricular wall thickness. Global RV systolic function is has normal systolic function.  Left Atrium: Left atrial size was mildly dilated.  Right Atrium: Right atrial size was normal in size  Pericardium: There is no evidence of pericardial effusion.  Mitral Valve: The mitral valve is normal in structure. No evidence of mitral valve stenosis by observation. Trace mitral valve regurgitation.  Tricuspid Valve: The tricuspid valve is normal in structure. Tricuspid valve regurgitation is not demonstrated.  Aortic Valve:  The aortic valve was not well visualized. Aortic valve regurgitation is not visualized. The aortic valve is structurally normal, with no evidence of sclerosis or stenosis.  Pulmonic Valve: The pulmonic valve was normal in structure. Pulmonic valve regurgitation is not visualized.  Aorta:  The aortic root, ascending aorta and aortic arch are all structurally normal, with no evidence of dilitation or obstruction.  Venous: The inferior vena cava is normal in size with greater than 50% respiratory variability, suggesting right atrial pressure of 3 mmHg.  IAS/Shunts: No atrial level shunt detected by color flow Doppler. No ventricular septal defect is seen or detected. There is no evidence of an atrial septal defect.    LEFT VENTRICLE PLAX 2D LVIDd:         3.50 cm  Diastology LVIDs:         2.50 cm  LV e' lateral:   14.50 cm/s LV PW:         1.25 cm  LV E/e' lateral: 6.0 LV IVS:        1.20 cm  LV e' medial:    9.25 cm/s LVOT diam:     2.20 cm  LV E/e' medial:  9.4 LV SV:         29 ml LV SV Index:   14.77 LVOT Area:     3.80 cm   RIGHT VENTRICLE RV S prime:     16.90 cm/s TAPSE (M-mode): 2.2 cm  LEFT ATRIUM           Index       RIGHT ATRIUM           Index LA diam:      2.70 cm 1.42 cm/m  RA Area:     11.80 cm LA Vol (A2C): 38.9 ml 20.41 ml/m RA Volume:   24.60 ml  12.91 ml/m LA Vol (A4C): 63.1 ml 33.11 ml/m  AORTA Ao Root diam: 2.70 cm  MITRAL VALVE MV Area (PHT): 3.21 cm             SHUNTS MV PHT:        68.44 msec           Systemic Diam: 2.20 cm MV Decel Time: 236 msec MV E velocity: 86.50 cm/s 103 cm/s MV A velocity: 68.10 cm/s 70.3 cm/s MV E/A ratio:  1.27       1.5   Chilton Si MD Electronically signed by Chilton Si MD Signature Date/Time: 04/29/2019/4:24:34 PM    Final   TEE  ECHO TEE 08/22/2015  Narrative *[place]* *[facility]* [address] [place], [place]  [address] [phone]  ------------------------------------------------------------------- Transesophageal Echocardiography  Patient:    [name], [name] MR #:       [id number] Study Date: 08/22/2015 Gender:     M Age:        54 Height:     172.7 cm Weight:     90 kg BSA:        2.1 m^2 Pt. Status: Room:  PERFORMING   Kristeen Miss, M.D. ATTENDING    Lyla Son T SONOGRAPHER  [facility] ADMITTING    Nahser, Jr  cc:  -------------------------------------------------------------------  ------------------------------------------------------------------- Indications:      424.1 Aortic valve disorders.  ------------------------------------------------------------------- History:   PMH:  CKD. Apical variant hypertrophic cardiomyopathy. Coronary artery disease.  Risk factors:  Current tobacco use. Hypertension. Dyslipidemia.  ------------------------------------------------------------------- Study Conclusions  - Left ventricle: The cavity size was normal. Wall thickness was increased in a pattern of moderate LVH. Systolic function was normal. - Aortic valve: No evidence of vegetation. - Mitral valve: No evidence of vegetation. - Left atrium: No evidence of thrombus in the atrial cavity or appendage. - Atrial septum:  No defect or patent foramen ovale was identified.  Diagnostic transesophageal echocardiography.  2D and color Doppler. Birthdate:  Patient birthdate: [birth year].  Age:  Patient is 62 yr old.  Sex:  Gender: male.    BMI: 30.2 kg/m^2.  Blood pressure: 132/83  Patient status:  Outpatient.  Study date:  Study date: 08/22/2015. Study time: 01:35 PM.  Location:  Endoscopy.  -------------------------------------------------------------------  ------------------------------------------------------------------- Left ventricle:  The cavity size was normal. Wall thickness was increased in a pattern of moderate LVH. Systolic function  was normal.  ------------------------------------------------------------------- Aortic valve:   Structurally normal valve.   Cusp separation was normal.  No evidence of vegetation.  Doppler:  There was no regurgitation.  ------------------------------------------------------------------- Aorta:  The aorta was mildly calcified.  ------------------------------------------------------------------- Mitral valve:   Structurally normal valve.   Leaflet separation was normal.  No evidence of vegetation.  Doppler:  There was trivial regurgitation.  ------------------------------------------------------------------- Left atrium:  The atrium was normal in size.  No evidence of thrombus in the atrial cavity or appendage.  ------------------------------------------------------------------- Atrial septum:  No defect or patent foramen ovale was identified.  ------------------------------------------------------------------- Post procedure conclusions Ascending Aorta:  - The aorta was mildly calcified.  ------------------------------------------------------------------- Prepared and Electronically Authenticated by  Kristeen Miss, M.D. 2017-03-24T16:47:52   MONITORS  LONG TERM MONITOR (3-14 DAYS) 09/20/2022  Narrative   Patient had a minimum heart rate of 21  bpm (nocturnal sinus pause) maximum heart rate of 141 bpm (asymptomatic SVT), and average heart rate of 73 bpm.   Predominant underlying rhythm was sinus rhythm.   Twenty two short runs os paroxysmal SVT lasting 5 seconds at longest.   Isolated PACs were rare (<1.0%). Isolated PVCs were rare (<1.0%).   Multiple nocturnal sinus pauses; all were asymptomatic, longest was 5 seconds.   Triggered and diary events associated with sinus rhythm and sinus tachycardia.  Asymptomatic SVT and sinus pauses.    CARDIAC MRI  MR CARDIAC MORPHOLOGY W WO CONTRAST 11/10/2022  Narrative CLINICAL DATA:  Clinical question of apical  hypertrophic cardiomyopathy Study assumes  BSA of 1.88 m2.  EXAM: CARDIAC MRI  TECHNIQUE: The patient was scanned on a 1.5 Tesla GE magnet. A dedicated cardiac coil was used. Functional imaging was done using Fiesta sequences. 2,3, and 4 chamber views were done to assess for RWMA's. Modified Simpson's rule using a short axis stack was used to calculate an ejection fraction on a dedicated work [employer]. The patient received 7 cc of Gadavist. After 10 minutes inversion recovery sequences were used to assess for infiltration and scar tissue. Flow quantification was performed 2 times during this examination with flow quantification performed at the levels of the ascending aorta above the valve, pulmonary artery above the valve.  CONTRAST:  7 cc  of Gadavist  FINDINGS: 1. Mild increase in left ventricular size, with LVEDD 48 mm, but LVEDVi 82 mL/m2.  Maximal apical thickness is 10.1 mm. When indexed to body surface area, measures 5.6 mm/m2. This is above the upper limits of normal and is suggestive of apical hypertrophic cardiomyopathy.  No aneurysm.  No thrombus.  Normal left ventricular systolic function (LVEF =67%). There are no regional wall motion abnormalities.  Left ventricular parametric mapping notable for normal ECV and T2.  There is no late gadolinium enhancement in the left ventricular myocardium.  2.  Mild increase in right ventricular size with RVEDVI 99 mL/m2.  Normal right ventricular thickness.  Normal right ventricular systolic function (RVEF =56%). There are no regional wall  motion abnormalities or aneurysms.  3.  Normal left and right atrial size.  4. Normal size of the aortic root, ascending aorta and pulmonary artery.  5. Valve assessment:  Aortic Valve: Tri-leaflet aortic valve. Mild regurgitation, regurgitant fraction 7%.  Pulmonic Valve: No regurgitation.  Tricuspid Valve: Qualitatively mild regurgitation.  Mitral  Valve: Elongated papillary muscle. Mild regurgitation, regurgitant fraction 13%.  6.  Normal pericardium.  No pericardial effusion.  7. Grossly, no extracardiac findings. Recommended dedicated study if concerned for non-cardiac pathology.  IMPRESSION: Study is suggestive of apical hypertrophic cardiomyopathy by BSA criteria, but without any high risk features.  Riley Lam MD   Electronically Signed By: Riley Lam M.D. On: 11/10/2022 21:50          EKG:   08/25/22: SR with LVH suggestive of apical HCM  2022: demonstrates left ventricular hypertrophy with strain pattern unchanged from prior tracings.  Left atrial abnormality.  Normal sinus rhythm.  Recent Labs: 06/23/2022: ALT 12; BUN 12; Creatinine, Ser 1.58; Potassium 3.8; Sodium 140 08/25/2022: Hemoglobin 12.0; Platelets 288  Recent Lipid Panel    Component Value Date/Time   CHOL 102 06/23/2022 0902   TRIG 36 06/23/2022 0902   HDL 60 06/23/2022 0902   CHOLHDL 1.7 06/23/2022 0902   CHOLHDL 1.8 05/12/2019 0317   VLDL 7 05/12/2019 0317   LDLCALC 32 06/23/2022 0902    Physical Exam:    VS:  BP 108/66   Pulse (!) 58   Ht 5\' 8"  (1.727 m)   Wt 161 lb 12.8 oz (73.4 kg)   SpO2 96%   BMI 24.60 kg/m     Wt Readings from Last 3 Encounters:  01/18/23 161 lb 12.8 oz (73.4 kg)  10/20/22 163 lb (73.9 kg)  08/25/22 170 lb (77.1 kg)     GEN: Healthy weight and size. No acute distress HEENT: Normal NECK: No JVD. CARDIAC: No murmur. RRR. VASCULAR:  Normal Pulses. RESPIRATORY:  Clear to auscultation without rales, wheezing or rhonchi  ABDOMEN: Soft, non-tender, non-distended MUSCULOSKELETAL: No deformity  SKIN: Warm and dry NEUROLOGIC:  Alert and oriented x 3 PSYCHIATRIC:  Normal affect   ASSESSMENT:    1. Apical variant hypertrophic cardiomyopathy (HCC)   2. Coronary artery disease involving native coronary artery of native heart with angina pectoris (HCC)   3. TOBACCO USE   4. Hyperlipidemia  with target LDL less than 70     PLAN:    Hypertrophic Cardiomyopathy - Apical Variant - no aneurysm - NYHA I; if sx ninerafaxstat would be reasonable in the future; would not consider apical myectomy at this time   - Family history reviewed, Discussed family screening  - genetics referral is pending  Risk of SCD at 5 years( 1.15%) - Based on the absence of risk factors, this patient does not have an indication for an ICD (Class 3 - No Benefit); reviewed with patient and wife - absence of atrial fibrillation - No NSVT; will do POET next year  Exercise Prescription  Frequency: three days per week to start  Intensity:  60 to 90 percent of heart rate reserve. Time: 30 minutes twice a week Type: weight lifting, medium weights, no overhead, spotting with Valsalva Step goal:  7000 per week at goal Progression: cardio and flexibility to start  Limitations:  Limitation in burst activity (we used heavy weight lifting, and burpees as examples)  Tobacco Abuse - discussed smoking cessation, contemplative for his nephew who has offered  Coronary Artery Disease; Obstructive - asymptomatic  on current therapy - anatomy: Has diagonal, Ramus, and LCX territory obstructive disease - continue plavix 75 mg - continue statin and repatha, goal LDL < 55 - continue ranolazine - continue nitrates (PRN)  One year with me   Medication Adjustments/Labs and Tests Ordered: Current medicines are reviewed at length with the patient today.  Concerns regarding medicines are outlined above.  No orders of the defined types were placed in this encounter.  No orders of the defined types were placed in this encounter.   Patient Instructions  Medication Instructions:  Your physician recommends that you continue on your current medications as directed. Please refer to the Current Medication list given to you today.  *If you need a refill on your cardiac medications before your next appointment, please  call your pharmacy*   Lab Work: NONE If you have labs (blood work) drawn today and your tests are completely normal, you will receive your results only by: MyChart Message (if you have MyChart) OR A paper copy in the mail If you have any lab test that is abnormal or we need to change your treatment, we will call you to review the results.   Testing/Procedures: NONE   Follow-Up: At [facility], you and your health needs are our priority.  As part of our continuing mission to provide you with exceptional heart care, we have created designated Provider Care Teams.  These Care Teams include your primary Cardiologist (physician) and Advanced Practice Providers (APPs -  Physician Assistants and Nurse Practitioners) who all work together to provide you with the care you need, when you need it.    Your next appointment:   1 year(s)  Provider:   Riley Lam, MD       Signed, Christell Constant, MD  01/18/2023 10:42 AM    [facility]

## 2023-01-18 ENCOUNTER — Ambulatory Visit: Payer: 59 | Admitting: Genetic Counselor

## 2023-01-18 ENCOUNTER — Encounter: Payer: Self-pay | Admitting: Internal Medicine

## 2023-01-18 ENCOUNTER — Ambulatory Visit: Payer: 59 | Attending: Internal Medicine | Admitting: Internal Medicine

## 2023-01-18 VITALS — BP 108/66 | HR 58 | Ht 68.0 in | Wt 161.8 lb

## 2023-01-18 DIAGNOSIS — I25119 Atherosclerotic heart disease of native coronary artery with unspecified angina pectoris: Secondary | ICD-10-CM

## 2023-01-18 DIAGNOSIS — E785 Hyperlipidemia, unspecified: Secondary | ICD-10-CM

## 2023-01-18 DIAGNOSIS — F17200 Nicotine dependence, unspecified, uncomplicated: Secondary | ICD-10-CM | POA: Diagnosis not present

## 2023-01-18 DIAGNOSIS — I422 Other hypertrophic cardiomyopathy: Secondary | ICD-10-CM

## 2023-01-18 NOTE — Patient Instructions (Signed)
Medication Instructions:  Your physician recommends that you continue on your current medications as directed. Please refer to the Current Medication list given to you today.  *If you need a refill on your cardiac medications before your next appointment, please call your pharmacy*   Lab Work: NONE If you have labs (blood work) drawn today and your tests are completely normal, you will receive your results only by: MyChart Message (if you have MyChart) OR A paper copy in the mail If you have any lab test that is abnormal or we need to change your treatment, we will call you to review the results.   Testing/Procedures: NONE   Follow-Up: At [facility], you and your health needs are our priority.  As part of our continuing mission to provide you with exceptional heart care, we have created designated Provider Care Teams.  These Care Teams include your primary Cardiologist (physician) and Advanced Practice Providers (APPs -  Physician Assistants and Nurse Practitioners) who all work together to provide you with the care you need, when you need it.   Your next appointment:   1 year(s)  Provider:   Mahesh Chandrasekhar, MD     

## 2023-01-19 ENCOUNTER — Ambulatory Visit: Payer: 59 | Admitting: Gastroenterology

## 2023-01-20 ENCOUNTER — Other Ambulatory Visit: Payer: Self-pay | Admitting: Sports Medicine

## 2023-01-20 DIAGNOSIS — G629 Polyneuropathy, unspecified: Secondary | ICD-10-CM

## 2023-01-21 ENCOUNTER — Ambulatory Visit (INDEPENDENT_AMBULATORY_CARE_PROVIDER_SITE_OTHER): Payer: 59 | Admitting: Gastroenterology

## 2023-01-21 ENCOUNTER — Encounter: Payer: Self-pay | Admitting: Gastroenterology

## 2023-01-21 ENCOUNTER — Telehealth: Payer: Self-pay

## 2023-01-21 ENCOUNTER — Other Ambulatory Visit: Payer: 59

## 2023-01-21 VITALS — BP 130/74 | HR 60 | Ht 67.5 in | Wt 163.2 lb

## 2023-01-21 DIAGNOSIS — K5900 Constipation, unspecified: Secondary | ICD-10-CM

## 2023-01-21 DIAGNOSIS — R1030 Lower abdominal pain, unspecified: Secondary | ICD-10-CM | POA: Diagnosis not present

## 2023-01-21 DIAGNOSIS — Z800 Family history of malignant neoplasm of digestive organs: Secondary | ICD-10-CM | POA: Diagnosis not present

## 2023-01-21 LAB — CBC WITH DIFFERENTIAL/PLATELET
Basophils Absolute: 0 10*3/uL (ref 0.0–0.1)
Basophils Relative: 0.8 % (ref 0.0–3.0)
Eosinophils Absolute: 0.1 10*3/uL (ref 0.0–0.7)
Eosinophils Relative: 2.2 % (ref 0.0–5.0)
HCT: 33.3 % — ABNORMAL LOW (ref 39.0–52.0)
Hemoglobin: 10.9 g/dL — ABNORMAL LOW (ref 13.0–17.0)
Lymphocytes Relative: 35.5 % (ref 12.0–46.0)
Lymphs Abs: 1.9 10*3/uL (ref 0.7–4.0)
MCHC: 32.7 g/dL (ref 30.0–36.0)
MCV: 92 fl (ref 78.0–100.0)
Monocytes Absolute: 0.3 10*3/uL (ref 0.1–1.0)
Monocytes Relative: 6.4 % (ref 3.0–12.0)
Neutro Abs: 2.9 10*3/uL (ref 1.4–7.7)
Neutrophils Relative %: 55.1 % (ref 43.0–77.0)
Platelets: 232 10*3/uL (ref 150.0–400.0)
RBC: 3.61 Mil/uL — ABNORMAL LOW (ref 4.22–5.81)
RDW: 12.2 % (ref 11.5–15.5)
WBC: 5.3 10*3/uL (ref 4.0–10.5)

## 2023-01-21 LAB — COMPREHENSIVE METABOLIC PANEL
ALT: 15 U/L (ref 0–53)
AST: 26 U/L (ref 0–37)
Albumin: 4.2 g/dL (ref 3.5–5.2)
Alkaline Phosphatase: 62 U/L (ref 39–117)
BUN: 14 mg/dL (ref 6–23)
CO2: 29 mEq/L (ref 19–32)
Calcium: 9.1 mg/dL (ref 8.4–10.5)
Chloride: 105 mEq/L (ref 96–112)
Creatinine, Ser: 1.17 mg/dL (ref 0.40–1.50)
GFR: 67.37 mL/min (ref >60.00)
Glucose, Bld: 68 mg/dL — ABNORMAL LOW (ref 70–99)
Potassium: 3.3 mEq/L — ABNORMAL LOW (ref 3.5–5.1)
Sodium: 141 mEq/L (ref 135–145)
Total Bilirubin: 0.7 mg/dL (ref 0.2–1.2)
Total Protein: 7.1 g/dL (ref 6.0–8.3)

## 2023-01-21 LAB — TSH: TSH: 0.96 u[IU]/mL (ref 0.35–5.50)

## 2023-01-21 MED ORDER — NA SULFATE-K SULFATE-MG SULF 17.5-3.13-1.6 GM/177ML PO SOLN: 1.0000 | ORAL | 0 refills | Status: DC

## 2023-01-21 NOTE — Progress Notes (Signed)
Chief Complaint: colonoscopy recall Primary GI MD: Dr. Myrtie Neither  HPI: 62 year old male history of hypertrophic cardiomyopathy, CAD (on Plavix), stroke, presents for evaluation of colonoscopy recall  Follows with Riley Lam, MD cardiology.  Recently seen 01/18/2023. Echocardiogram 04/2019 with ejection fraction 60 to 65%  CMP 05/2022 Normal LFTs BUN 12, creatinine 1.58, GFR 50  CBC 07/2022 Hgb 12, MCV 90, stable  Patient states over the last year he has struggled with constipation. He has chronic constipation intermittently for many years but feels it has become more consistent over the last year.  Reports he can go 3 days without a bowel movement. States he often has to strain and will have hard bowel movements that are difficult to pass or come out as pebbles. Denies melena/hematochezia. He states he will take miralax as needed. When he takes it he will have relief but he is not consistent with taking the medication. He reports he drinks minimal to no water on a daily basis. Sometimes he can drink up to 3 bottles, but often times it is less than one bottle.  He states over the last 4 months he will have intermittent lower abdominal pain. This can even wake him up at night. It is relieved after he has a bowel movement.  Denies weight loss. Reports family history of colon cancer in his maternal uncle's in their 87s.   PREVIOUS GI WORKUP   Colonoscopy 08/2012 with Dr. Jarold Motto Normal, no polyps.  Repeat 5 years due to family history of colon cancer  Past Medical History:  Diagnosis Date   AC (acromioclavicular) arthritis 11/23/2018   Injected November 23, 2018   Apical variant hypertrophic cardiomyopathy (HCC)    CAD (coronary artery disease)    a. LHC in 2011 with severe dz in D1/D2 and very distal LAD--> too small for PCI and managed medically.  b.  LHC in 10/2013 after an abnormal nuclear study w/ non-obst dz/c. 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   HOH (hard of hearing)    rgiht ear   Hyperlipidemia    Hypertension    Migraine headache    Stroke [facility])     Past Surgical History:  Procedure Laterality Date   CARDIAC CATHETERIZATION N/A 02/24/2016   Procedure: Left Heart Cath and Coronary Angiography;  Surgeon: Tonny Bollman, MD;  Location: [facility] INVASIVE CV LAB;  Service: Cardiovascular;  Laterality: N/A;   CARDIAC CATHETERIZATION N/A 02/24/2016   Procedure: Coronary Stent Intervention;  Surgeon: Tonny Bollman, MD;  Location: [facility] INVASIVE CV LAB;  Service: Cardiovascular;  Laterality: N/A;   CORONARY ANGIOPLASTY WITH STENT PLACEMENT  2017   CORONARY BALLOON ANGIOPLASTY N/A 04/30/2019   Procedure: CORONARY BALLOON ANGIOPLASTY;  Surgeon: Swaziland, Peter M, MD;  Location: [facility] INVASIVE CV LAB;  Service: Cardiovascular;  Laterality: N/A;   INGUINAL HERNIA REPAIR     LEFT HEART CATH AND CORONARY ANGIOGRAPHY N/A 04/30/2019   Procedure: LEFT HEART CATH AND CORONARY ANGIOGRAPHY;  Surgeon: Dolores Patty, MD;  Location: [facility] INVASIVE CV LAB;  Service: Cardiovascular;  Laterality: N/A;   LEFT HEART CATH AND CORONARY ANGIOGRAPHY N/A 05/14/2019  Procedure: LEFT HEART CATH AND CORONARY ANGIOGRAPHY;  Surgeon: Swaziland, Peter M, MD;  Location: [facility] INVASIVE CV LAB;  Service: Cardiovascular;  Laterality: N/A;   LEFT HEART CATHETERIZATION WITH CORONARY ANGIOGRAM N/A 11/22/2013   Procedure: LEFT HEART CATHETERIZATION WITH CORONARY ANGIOGRAM;  Surgeon: [name], MD;  Location: [facility] CATH LAB;  Service: Cardiovascular;  Laterality: N/A;   TEE WITHOUT CARDIOVERSION N/A 08/22/2015   Procedure: TRANSESOPHAGEAL ECHOCARDIOGRAM (TEE);  Surgeon: Vesta Mixer, MD;  Location: [facility] ENDOSCOPY;  Service: Cardiovascular;  Laterality: N/A;    Current Outpatient Medications  Medication Sig Dispense Refill   amLODipine (NORVASC) 10 MG tablet Take 1 tablet (10 mg total) by mouth daily. 90 tablet 1   clopidogrel (PLAVIX) 75 MG tablet TAKE 1 TABLET (75 MG TOTAL) BY MOUTH DAILY. 90 tablet 3   Evolocumab (REPATHA SURECLICK) 140 MG/ML SOAJ Inject 140 mg into the skin every 14 (fourteen) days. 6 mL 3   gabapentin (NEURONTIN) 300 MG capsule Take 1 capsule (300 mg total) by mouth 2 (two) times daily. 60 capsule 1   losartan-hydrochlorothiazide (HYZAAR) 100-25 MG tablet TAKE 1 TABLET BY MOUTH DAILY. 90 tablet 2   naproxen (NAPROSYN) 500 MG tablet Take by mouth as needed for moderate pain.     nicotine polacrilex (NICORETTE MINI) 4 MG lozenge Use 1-3 daily to quit smoking 100 tablet 4   nitroGLYCERIN (NITROSTAT) 0.4 MG SL tablet Place 1 tablet (0.4 mg total) under the tongue every 5 (five) minutes as needed for chest pain. 25 tablet 3   pyridOXINE (VITAMIN B6) 100 MG tablet Take 1 tablet (100 mg total) by mouth daily. 60 tablet 1   ranolazine (RANEXA) 500 MG 12 hr tablet TAKE 1 TABLET (500 MG TOTAL) BY MOUTH 2 (TWO) TIMES DAILY. 180 tablet 1   rosuvastatin (CRESTOR) 40 MG tablet TAKE 1 TABLET (40 MG TOTAL) BY MOUTH DAILY. 90 tablet 2   No current facility-administered medications for this visit.    Allergies as of 01/21/2023 - Review Complete 01/18/2023  Allergen Reaction Noted   Lisinopril Swelling 10/15/2009    Family History  Problem Relation Age of Onset   Hypertension Mother    Heart failure Mother    Hypertension Father    Colon cancer Father 81   Colon cancer Paternal Uncle     Social History   Socioeconomic History   Marital status: Married    Spouse name: Not on file   Number of children: Not on file   Years of education: Not on file   Highest education level: Not on file  Occupational History   Not on file  Tobacco Use   Smoking  status: Some Days    Current packs/day: 1.50    Types: Cigarettes   Smokeless tobacco: Never  Vaping Use   Vaping status: Never Used  Substance and Sexual Activity   Alcohol use: No    Alcohol/week: 0.0 standard drinks of alcohol   Drug use: No   Sexual activity: Not Currently  Other Topics Concern   Not on file  Social History Narrative   Not on file   Social Determinants of Health   Financial Resource Strain: Not on file  Food Insecurity: Not on file  Transportation Needs: Not on file  Physical Activity: Not on file  Stress: Not on file  Social Connections: Not on file  Intimate Partner Violence: Not on file    Review of Systems:    Constitutional: No weight loss, fever, chills, weakness or  fatigue HEENT: Eyes: No change in vision               Ears, Nose, Throat:  No change in hearing or congestion Skin: No rash or itching Cardiovascular: No chest pain, chest pressure or palpitations   Respiratory: No SOB or cough Gastrointestinal: See HPI and otherwise negative Genitourinary: No dysuria or change in urinary frequency Neurological: No headache, dizziness or syncope Musculoskeletal: No new muscle or joint pain Hematologic: No bleeding or bruising Psychiatric: No history of depression or anxiety    Physical Exam:  Vital signs: There were no vitals taken for this visit.  Constitutional: NAD, Well developed, Well nourished, alert and cooperative Head:  Normocephalic and atraumatic. Eyes:   PEERL, EOMI. No icterus. Conjunctiva pink. Respiratory: Respirations even and unlabored. Lungs clear to auscultation bilaterally.   No wheezes, crackles, or rhonchi.  Cardiovascular:  Regular rate and rhythm. No peripheral edema, cyanosis or pallor.  Gastrointestinal:  Soft, nondistended, nontender. No rebound or guarding. Normal bowel sounds. No appreciable masses or hepatomegaly. Rectal:  Not performed.  Msk:  Symmetrical without gross deformities. Without edema, no deformity  or joint abnormality.  Neurologic:  Alert and  oriented x4;  grossly normal neurologically.  Skin:   Dry and intact without significant lesions or rashes. Psychiatric: Oriented to person, place and time. Demonstrates good judgement and reason without abnormal affect or behaviors.   RELEVANT LABS AND IMAGING: CBC    Component Value Date/Time   WBC 4.9 08/25/2022 0949   WBC 4.6 03/16/2021 1406   RBC 4.05 (L) 08/25/2022 0949   RBC 4.03 (L) 03/16/2021 1406   HGB 12.0 (L) 08/25/2022 0949   HCT 36.3 (L) 08/25/2022 0949   PLT 288 08/25/2022 0949   MCV 90 08/25/2022 0949   MCH 29.6 08/25/2022 0949   MCH 29.8 03/16/2021 1406   MCHC 33.1 08/25/2022 0949   MCHC 31.8 03/16/2021 1406   RDW 11.9 08/25/2022 0949   LYMPHSABS 2.1 10/08/2021 1525   MONOABS 0.5 04/28/2019 2034   EOSABS 0.2 10/08/2021 1525   BASOSABS 0.0 10/08/2021 1525    CMP     Component Value Date/Time   NA 140 06/23/2022 0902   K 3.8 06/23/2022 0902   CL 102 06/23/2022 0902   CO2 22 06/23/2022 0902   GLUCOSE 81 06/23/2022 0902   GLUCOSE 81 03/16/2021 1406   BUN 12 06/23/2022 0902   CREATININE 1.58 (H) 06/23/2022 0902   CREATININE 1.33 03/09/2016 1032   CALCIUM 9.5 06/23/2022 0902   PROT 7.1 06/23/2022 0902   ALBUMIN 4.6 06/23/2022 0902   AST 18 06/23/2022 0902   ALT 12 06/23/2022 0902   ALKPHOS 81 06/23/2022 0902   BILITOT 0.6 06/23/2022 0902   GFRNONAA >60 03/16/2021 1406   GFRAA 69 05/07/2020 0904     Assessment/Plan:   Constipation, unspecified constipation type Lower abdominal pain --- Start taking Miralax 1 capful (17 grams) 1x / day for 1 week.   If this is not effective, increase to 1 dose 2x / day for 1 week.   If this is still not effective, increase to two capfuls (34 grams) 2x / day.   Can adjust dose as needed based on response. Can take 1/2 cap daily, skip days, or increase per day.   --- Fiber daily --- Increase water to 60 to 100 ounces daily, increase exercise --- Squatty potty to help  with bowel movements --- BC, CMP, TSH --- Colonoscopy for further evaluation --- IBgard as needed, samples provided ---  I thoroughly discussed the procedure with the patient (at bedside) to include nature of the procedure, alternatives, benefits, and risks (including but not limited to bleeding, infection, perforation, anesthesia/cardiac pulmonary complications).  Patient verbalized understanding and gave verbal consent to proceed with procedure.  --- hold plavix 5 days, blood thinner cease from cardiologist  Family history of colon cancer Maternal uncles age 59s   Alannah Averhart Darreld Mclean, PA-C [facility] 01/21/2023, 10:35 AM  Cc: Storm Frisk, MD

## 2023-01-21 NOTE — Telephone Encounter (Signed)
Request for surgical clearance:     Endoscopy Procedure  What type of surgery is being performed?     Colonoscopy  When is this surgery scheduled?     02/16/23  What type of clearance is required ?   Pharmacy  Are there any medications that need to be held prior to surgery and how long? Plavix x5 days prior to procedure   Practice name and name of physician performing surgery?       Gastroenterology  What is your office phone and fax number?      Phone- [phone]  Fax- [phone]  Anesthesia type (None, local, MAC, general) ?       MAC

## 2023-01-21 NOTE — Telephone Encounter (Signed)
   Patient Name: [name]  DOB: 1961-02-03 MRN: [id number]  Primary Cardiologist: Christell Constant, MD  Patient can hold Plavix 5 days prior to procedure and should restart postprocedure under guidance of the GI team.  I will route this recommendation to the requesting party via Epic fax function and remove from pre-op pool.  Please call with questions.  Napoleon Form, Leodis Rains, NP 01/21/2023, 3:33 PM

## 2023-01-21 NOTE — Patient Instructions (Addendum)
Start taking Miralax 1 capful (17 grams) 1x / day for 1 week.   If this is not effective, increase to 1 dose 2x / day for 1 week.   If this is still not effective, increase to two capfuls (34 grams) 2x / day.   Can adjust dose as needed based on response. Can take 1/2 cap daily, skip days, or increase per day.    IB gard as needed for pain ( this can be purchased over the counter) samples given today.   Increase water 60-100oz daily  Use squatty potty  You have been scheduled for a colonoscopy. Please follow written instructions given to you at your visit today.   Please pick up your prep supplies at the pharmacy within the next 1-3 days.  If you use inhalers (even only as needed), please bring them with you on the day of your procedure.  DO NOT TAKE 7 DAYS PRIOR TO TEST- Trulicity (dulaglutide) Ozempic, Wegovy (semaglutide) Mounjaro (tirzepatide) Bydureon Bcise (exanatide extended release)  DO NOT TAKE 1 DAY PRIOR TO YOUR TEST Rybelsus (semaglutide) Adlyxin (lixisenatide) Victoza (liraglutide) Byetta (exanatide) ___________________________________________________________________________  We have sent the following medications to your pharmacy for you to pick up at your convenience: Suprep   Your provider has requested that you go to the basement level for lab work before leaving today. Press "B" on the elevator. The lab is located at the first door on the left as you exit the elevator.  Due to recent changes in healthcare laws, you may see the results of your imaging and laboratory studies on MyChart before your provider has had a chance to review them.  We understand that in some cases there may be results that are confusing or concerning to you. Not all laboratory results come back in the same time frame and the provider may be waiting for multiple results in order to interpret others.  Please give [place] 48 hours in order for your provider to thoroughly review all the results  before contacting the office for clarification of your results.   _______________________________________________________  If your blood pressure at your visit was 140/90 or greater, please contact your primary care physician to follow up on this.  _______________________________________________________  If you are age 61 or older, your body mass index should be between 23-30. Your Body mass index is 25.19 kg/m. If this is out of the aforementioned range listed, please consider follow up with your Primary Care Provider.  If you are age 32 or younger, your body mass index should be between 19-25. Your Body mass index is 25.19 kg/m. If this is out of the aformentioned range listed, please consider follow up with your Primary Care Provider.   ________________________________________________________  The [facility] providers would like to encourage you to use [facility] to communicate with providers for non-urgent requests or questions.  Due to long hold times on the telephone, sending your provider a message by [facility] may be a faster and more efficient way to get a response.  Please allow 48 business hours for a response.  Please remember that this is for non-urgent requests.  _______________________________________________________  Thank you for choosing me and [facility].

## 2023-01-22 NOTE — Progress Notes (Signed)
____________________________________________________________  Attending physician addendum:  Thank you for sending this case to me. I have reviewed the entire note and agree with the plan.  Sounds like mostly slow transit/functional constipation that needs increased dietary fiber and fluids.  Obstructive cause certainly must be ruled out with colonoscopy.  None of his medicines seem likely culprits.  Amada Jupiter, MD  ____________________________________________________________

## 2023-01-25 NOTE — Progress Notes (Signed)
error 

## 2023-01-26 ENCOUNTER — Other Ambulatory Visit: Payer: Self-pay | Admitting: *Deleted

## 2023-01-26 ENCOUNTER — Telehealth: Payer: Self-pay | Admitting: Gastroenterology

## 2023-01-26 DIAGNOSIS — D649 Anemia, unspecified: Secondary | ICD-10-CM

## 2023-01-26 DIAGNOSIS — E876 Hypokalemia: Secondary | ICD-10-CM

## 2023-01-26 MED ORDER — POTASSIUM CHLORIDE CRYS ER 20 MEQ PO TBCR: 40.0000 meq | EXTENDED_RELEASE_TABLET | Freq: Every day | ORAL | 0 refills | Status: DC

## 2023-01-26 NOTE — Telephone Encounter (Signed)
Patients wife returned call from yesterday, please advise.

## 2023-01-26 NOTE — Telephone Encounter (Signed)
See 01/21/23 lab notes for further details.

## 2023-02-03 ENCOUNTER — Encounter: Payer: Self-pay | Admitting: Gastroenterology

## 2023-02-08 ENCOUNTER — Other Ambulatory Visit (INDEPENDENT_AMBULATORY_CARE_PROVIDER_SITE_OTHER): Payer: 59

## 2023-02-08 DIAGNOSIS — E876 Hypokalemia: Secondary | ICD-10-CM

## 2023-02-08 DIAGNOSIS — D649 Anemia, unspecified: Secondary | ICD-10-CM

## 2023-02-08 LAB — BASIC METABOLIC PANEL WITH GFR
BUN: 17 mg/dL (ref 6–23)
CO2: 29 meq/L (ref 19–32)
Calcium: 9.7 mg/dL (ref 8.4–10.5)
Chloride: 101 meq/L (ref 96–112)
Creatinine, Ser: 1.38 mg/dL (ref 0.40–1.50)
GFR: 55.24 mL/min — ABNORMAL LOW
Glucose, Bld: 93 mg/dL (ref 70–99)
Potassium: 3.7 meq/L (ref 3.5–5.1)
Sodium: 138 meq/L (ref 135–145)

## 2023-02-08 LAB — IBC + FERRITIN
Ferritin: 148.9 ng/mL (ref 22.0–322.0)
Iron: 56 ug/dL (ref 42–165)
Saturation Ratios: 18.3 % — ABNORMAL LOW (ref 20.0–50.0)
TIBC: 305.2 ug/dL (ref 250.0–450.0)
Transferrin: 218 mg/dL (ref 212.0–360.0)

## 2023-02-08 LAB — B12 AND FOLATE PANEL
Folate: 12.1 ng/mL (ref >5.9)
Vitamin B-12: 425 pg/mL (ref 211–911)

## 2023-02-12 ENCOUNTER — Encounter: Payer: Self-pay | Admitting: Certified Registered Nurse Anesthetist

## 2023-02-16 ENCOUNTER — Ambulatory Visit: Payer: 59 | Admitting: Gastroenterology

## 2023-02-16 ENCOUNTER — Encounter: Payer: Self-pay | Admitting: Gastroenterology

## 2023-02-16 VITALS — BP 127/89 | HR 61 | Temp 96.8°F | Resp 13 | Ht 67.5 in | Wt 163.0 lb

## 2023-02-16 DIAGNOSIS — D128 Benign neoplasm of rectum: Secondary | ICD-10-CM | POA: Diagnosis not present

## 2023-02-16 DIAGNOSIS — D123 Benign neoplasm of transverse colon: Secondary | ICD-10-CM

## 2023-02-16 DIAGNOSIS — D122 Benign neoplasm of ascending colon: Secondary | ICD-10-CM

## 2023-02-16 DIAGNOSIS — K5900 Constipation, unspecified: Secondary | ICD-10-CM

## 2023-02-16 MED ORDER — SODIUM CHLORIDE 0.9 % IV SOLN
500.0000 mL | Freq: Once | INTRAVENOUS | Status: DC
Start: 2023-02-16 — End: 2023-02-16

## 2023-02-16 NOTE — Progress Notes (Signed)
Vitals-[name]  Pt's states no medical or surgical changes since previsit or office visit.

## 2023-02-16 NOTE — Progress Notes (Signed)
Report given to PACU, vss 

## 2023-02-16 NOTE — Patient Instructions (Addendum)
Resume Plavix tomorrow Continue MiraLAx to help relieve constiation, increase to twice a daily if necessary, and increase daily water intake Follow up in December 2024   YOU HAD AN ENDOSCOPIC PROCEDURE TODAY: Refer to the procedure report and other information in the discharge instructions given to you for any specific questions about what was found during the examination. If this information does not answer your questions, please call [place] office at [phone] to clarify.   YOU SHOULD EXPECT: Some feelings of bloating in the abdomen. Passage of more gas than usual. Walking can help get rid of the air that was put into your GI tract during the procedure and reduce the bloating. If you had a lower endoscopy (such as a colonoscopy or flexible sigmoidoscopy) you may notice spotting of blood in your stool or on the toilet paper. Some abdominal soreness may be present for a day or two, also.  DIET: Your first meal following the procedure should be a light meal and then it is ok to progress to your normal diet. A half-sandwich or bowl of soup is an example of a good first meal. Heavy or fried foods are harder to digest and may make you feel nauseous or bloated. Drink plenty of fluids but you should avoid alcoholic beverages for 24 hours. If you had a esophageal dilation, please see attached instructions for diet.    ACTIVITY: Your care partner should take you home directly after the procedure. You should plan to take it easy, moving slowly for the rest of the day. You can resume normal activity the day after the procedure however YOU SHOULD NOT DRIVE, use power tools, machinery or perform tasks that involve climbing or major physical exertion for 24 hours (because of the sedation medicines used during the test).   SYMPTOMS TO REPORT IMMEDIATELY: A gastroenterologist can be reached at any hour. Please call [phone]  for any of the following symptoms:  Following lower endoscopy (colonoscopy, flexible  sigmoidoscopy) Excessive amounts of blood in the stool  Significant tenderness, worsening of abdominal pains  Swelling of the abdomen that is new, acute  Fever of 100 or higher  FOLLOW UP:  If any biopsies were taken you will be contacted by phone or by letter within the next 1-3 weeks. Call [phone]  if you have not heard about the biopsies in 3 weeks.  Please also call with any specific questions about appointments or follow up tests.

## 2023-02-16 NOTE — Progress Notes (Signed)
Called to room to assist during endoscopic procedure.  Patient ID and intended procedure confirmed with present staff. Received instructions for my participation in the procedure from the performing physician.

## 2023-02-16 NOTE — Progress Notes (Signed)
No changes to clinical history since GI office visit on 01/21/23.  The patient is appropriate for an endoscopic procedure in the ambulatory setting.  - Amada Jupiter, MD

## 2023-02-16 NOTE — Op Note (Signed)
[facility] Patient Name: [name] Procedure Date: 02/16/2023 9:27 AM MRN: [id number] Endoscopist: Sherilyn Cooter L. Myrtie Neither , MD, [id number] Age: 62 Referring MD:  Date of Birth: August 22, 1960 Gender: Male Account #: [id number] Procedure:                Colonoscopy Indications:              Constipation Medicines:                Monitored Anesthesia Care Procedure:                Pre-Anesthesia Assessment:                           - Prior to the procedure, a History and Physical                            was performed, and patient medications and                            allergies were reviewed. The patient's tolerance of                            previous anesthesia was also reviewed. The risks                            and benefits of the procedure and the sedation                            options and risks were discussed with the patient.                            All questions were answered, and informed consent                            was obtained. Prior Anticoagulants: The patient has                            taken Plavix (clopidogrel), last dose was 5 days                            prior to procedure. ASA Grade Assessment: III - A                            patient with severe systemic disease. After                            reviewing the risks and benefits, the patient was                            deemed in satisfactory condition to undergo the                            procedure.  After obtaining informed consent, the colonoscope                            was passed under direct vision. Throughout the                            procedure, the patient's blood pressure, pulse, and                            oxygen saturations were monitored continuously. The                            CF [id number] #[id number] was introduced through the anus                            and advanced to the the cecum, identified by                             appendiceal orifice and ileocecal valve. The                            colonoscopy was performed without difficulty. The                            patient tolerated the procedure well. The quality                            of the bowel preparation was excellent. The                            ileocecal valve, appendiceal orifice, and rectum                            were photographed. Scope In: 9:42:38 AM Scope Out: 9:57:31 AM Scope Withdrawal Time: 0 hours 10 minutes 39 seconds  Total Procedure Duration: 0 hours 14 minutes 53 seconds  Findings:                 Prolapsed internal hemorrhoids were found on                            perianal exam.                           Repeat examination of right colon under NBI                            performed.                           A diminutive polyp was found in the transverse                            colon. The polyp was sessile. The polyp was removed  with a cold snare. Resection and retrieval were                            complete.                           A diminutive polyp was found in the distal rectum.                            The polyp was flat. The polyp was removed with a                            cold biopsy forceps. Resection and retrieval were                            complete.                           Internal hemorrhoids were found.                           The exam was otherwise without abnormality on                            direct and retroflexion views. Complications:            No immediate complications. Estimated Blood Loss:     Estimated blood loss was minimal. Impression:               - Hemorrhoids found on perianal exam.                           - One diminutive polyp in the transverse colon,                            removed with a cold snare. Resected and retrieved.                           - One diminutive polyp in the distal rectum,                             removed with a cold biopsy forceps. Resected and                            retrieved.                           - Internal hemorrhoids.                           - The examination was otherwise normal on direct                            and retroflexion views. Recommendation:           - Patient has a contact number available for  emergencies. The signs and symptoms of potential                            delayed complications were discussed with the                            patient. Return to normal activities tomorrow.                            Written discharge instructions were provided to the                            patient.                           - Resume previous diet.                           - Resume Plavix (clopidogrel) at prior dose                            tomorrow.                           - Await pathology results.                           - Repeat colonoscopy is recommended for                            surveillance. The colonoscopy date will be                            determined after pathology results from today's                            exam become available for review.                           - Schedule clinic follow-up with [facility])                            next available appointment.                           Continue daily MiraLAX to help relieve                            constipation. Increase to twice daily if necessary,                            and increase daily water consumption.                           Does not sufficiently improve constipation,  consideration will be given to prescription                            treatment such as Linzess, Amitiza, Trulance. Lanette Ell L. Myrtie Neither, MD 02/16/2023 10:04:19 AM This report has been signed electronically.

## 2023-02-17 ENCOUNTER — Telehealth: Payer: Self-pay

## 2023-02-17 NOTE — Telephone Encounter (Signed)
  Follow up Call-     02/16/2023    8:58 AM 02/16/2023    8:54 AM  Call back number  Post procedure Call Back phone  # [phone]   Permission to leave phone message  Yes   Follow up call, LVM

## 2023-02-18 LAB — SURGICAL PATHOLOGY

## 2023-02-19 ENCOUNTER — Other Ambulatory Visit: Payer: Self-pay | Admitting: Physician Assistant

## 2023-02-19 DIAGNOSIS — I10 Essential (primary) hypertension: Secondary | ICD-10-CM

## 2023-02-22 ENCOUNTER — Encounter: Payer: Self-pay | Admitting: Gastroenterology

## 2023-03-08 ENCOUNTER — Ambulatory Visit: Payer: Self-pay | Admitting: *Deleted

## 2023-03-08 NOTE — Telephone Encounter (Signed)
  Chief Complaint: weight loss/ depression per patient wife not with patient now  Symptoms: weight loss x 6 months or greater more noticeable to wife. "Can see his bones" when clothes are off. Last OV 163 lbs. Unsure of weight now. Reports poor po intake. Wife packs lunch and patient brings almost all of lunch back home. Stressed regarding multiple people asking patient to borrow money and noted patient to be more stressed or depressed.  Frequency: greater than 6 months  Pertinent Negatives: Patient denies chest pain no difficulty breathing  Disposition: [] ED /[] Urgent Care (no appt availability in office) / [x] Appointment(In office/virtual)/ []  [place] [facility]/ [] Home Care/ [] Refused Recommended Disposition /[] [facility]/ []  Follow-up with PCP Additional Notes:   Not able to get appt until 03/30/23 earliest available. Patient 's wife reports patient stressed he may have cancer. Last letter out from GI reports 2 precancerous polyps removed 02/22/23. Please advise if earlier appt available .   Summary: fatigue   Pt's wife called wanting her husband to be seen as soon as he can.  He is having stress and fatigue.  He used to see Dr, Delford Field and there is nothing until nov.  CB#  [phone]           Reason for Disposition  MODERATE unexplained weight loss (e.g., 5%, 8 to 10 pounds [4 - 5 kg] in person who weighs 170 to 200 pounds [75 - 90 kg])  Answer Assessment - Initial Assessment Questions 1. MAIN CONCERN: "What is your main concern today?"     Stressed and losing weight approx 6 months or more per patient wife  2. WEIGHT LOSS: "How much weight have you lost?"  (e.g., lbs., kgs.)  "Over what period of time have you lost this weight?"  (e.g., number of days, weeks, months, years)     Not sure last OV 163 lbs 3. BASELINE WEIGHT: "What is your baseline or normal weight?" (e.g., "How much do you usually weigh?")     Not sure  4. CAUSE: "What do you think is causing the  weight loss?" (e.g., depression, anxiety, medicine side effect, pain, trouble swallowing, substance or alcohol use problem, eating disorder)     Depression , poor po intake, quieter "rubs head" 5. PRIOR EVALUATION: "Have you been evaluated by a doctor for your weight loss?" If Yes, ask "When was your last visit?" "What did your doctor (or NP/PA) tell you about the possible cause?"     No  6. HEART FAILURE TREATMENT: "Do you have heart failure?" If Yes, ask: "Have you taken new or extra water pills (diuretics) recently?" (e.g., furosemide; bumetanide). "What is your target weight?"     See hx  7. OTHER SYMPTOMS: "Do you have any other symptoms?" (e.g., anxiety or depression, blood in stool, breathing difficulty, diarrhea, fever, trouble swallowing)     Depression poor po intake.  8. PREGNANCY: "Is there any chance you are pregnant?" "When was your last menstrual period?"     na  Protocols used: Weight Loss - Unintended-A-AH

## 2023-03-08 NOTE — Telephone Encounter (Signed)
Parient rescheduled for 03/09/2023

## 2023-03-08 NOTE — Telephone Encounter (Signed)
fatigue   Pt's wife called wanting her husband to be seen as soon as he can.  He is having stress and fatigue.  He used to see Dr, Delford Field and there is nothing until nov.  CB#  [phone]

## 2023-03-09 ENCOUNTER — Telehealth: Payer: Self-pay | Admitting: Licensed Clinical Social Worker

## 2023-03-09 ENCOUNTER — Ambulatory Visit: Payer: Self-pay | Attending: Licensed Clinical Social Worker | Admitting: Licensed Clinical Social Worker

## 2023-03-09 ENCOUNTER — Encounter: Payer: Self-pay | Admitting: Family Medicine

## 2023-03-09 ENCOUNTER — Other Ambulatory Visit: Payer: Self-pay | Admitting: Family Medicine

## 2023-03-09 ENCOUNTER — Ambulatory Visit: Payer: 59 | Attending: Family Medicine | Admitting: Family Medicine

## 2023-03-09 VITALS — BP 129/73 | HR 60 | Ht 67.5 in | Wt 162.0 lb

## 2023-03-09 DIAGNOSIS — I422 Other hypertrophic cardiomyopathy: Secondary | ICD-10-CM | POA: Diagnosis not present

## 2023-03-09 DIAGNOSIS — I25119 Atherosclerotic heart disease of native coronary artery with unspecified angina pectoris: Secondary | ICD-10-CM

## 2023-03-09 DIAGNOSIS — I10 Essential (primary) hypertension: Secondary | ICD-10-CM

## 2023-03-09 DIAGNOSIS — Z125 Encounter for screening for malignant neoplasm of prostate: Secondary | ICD-10-CM | POA: Diagnosis not present

## 2023-03-09 DIAGNOSIS — R634 Abnormal weight loss: Secondary | ICD-10-CM

## 2023-03-09 DIAGNOSIS — F17210 Nicotine dependence, cigarettes, uncomplicated: Secondary | ICD-10-CM

## 2023-03-09 DIAGNOSIS — F32A Depression, unspecified: Secondary | ICD-10-CM

## 2023-03-09 MED ORDER — BUPROPION HCL ER (XL) 150 MG PO TB24
150.0000 mg | ORAL_TABLET | Freq: Every day | ORAL | 1 refills | Status: DC
Start: 2023-03-09 — End: 2023-08-23

## 2023-03-09 NOTE — Progress Notes (Signed)
Subjective:  Patient ID: [name], male    DOB: [birth year]  Age: 62 y.o. MRN: [id number]  CC: Weight Loss and Depression   HPI [name] is a 62 y.o. year old male with a history of hypertrophic cardiomyopathy, CAD (currently on Ranexa), hypertension, nicotine dependence (35 ppd)  Interval History: Discussed the use of AI scribe software for clinical note transcription with the patient, who gave verbal consent to proceed.  He presents with concerns of weight loss and depression. He reports a loss of approximately 16 pounds over the past eight months, associated with a decreased appetite. He denies cough, shortness of breath, and diarrhea. The patient also reports depressive symptoms, primarily related to financial and emotional strain from supporting his family for over 35 years. He expresses feelings of exhaustion and has occasionally expressed to his spouse that he is 'tired' and 'sometimes I just run low.' He denies any thoughts of self-harm. The patient also mentions the added stress of caring for his wife's sister's grandson, who has been living with them for two years and has special needs.  Endorses adherence with his antihypertensive and his cardiac medications.  Last cardiology visit was in 12/2022.    Past Medical History:  Diagnosis Date   AC (acromioclavicular) arthritis 11/23/2018   Injected November 23, 2018   Apical variant hypertrophic cardiomyopathy (HCC)    CAD (coronary artery disease)    a. LHC in 2011 with severe dz in D1/D2 and very distal LAD--> too small for PCI and managed medically.  b.  LHC in 10/2013 after an abnormal nuclear study w/ non-obst dz/c. 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   HOH (hard of hearing)    rgiht ear   Hyperlipidemia    Hypertension    Migraine headache    Stroke [facility])     Past Surgical History:  Procedure Laterality Date   CARDIAC CATHETERIZATION N/A 02/24/2016   Procedure: Left Heart Cath and Coronary Angiography;  Surgeon: Tonny Bollman, MD;  Location[facility] INVASIVE CV LAB;  Service: Cardiovascular;  Laterality: N/A;   CARDIAC CATHETERIZATION N/A 02/24/2016   Procedure: Coronary Stent Intervention;  Surgeon: Tonny Bollman, MD;  Location: [facility] INVASIVE CV LAB;  Service: Cardiovascular;  Laterality: N/A;   CORONARY ANGIOPLASTY WITH STENT PLACEMENT  2017   CORONARY BALLOON ANGIOPLASTY N/A 04/30/2019   Procedure: CORONARY BALLOON ANGIOPLASTY;  Surgeon: Swaziland, Peter M, MD;  Location: [facility] INVASIVE CV LAB;  Service: Cardiovascular;  Laterality: N/A;   INGUINAL HERNIA REPAIR Left    LEFT HEART CATH AND CORONARY ANGIOGRAPHY N/A 04/30/2019   Procedure: LEFT HEART CATH AND CORONARY ANGIOGRAPHY;  Surgeon: Dolores Patty, MD;  Location: [facility] INVASIVE CV LAB;  Service: Cardiovascular;  Laterality: N/A;   LEFT HEART CATH AND CORONARY ANGIOGRAPHY N/A 05/14/2019   Procedure: LEFT HEART CATH AND CORONARY ANGIOGRAPHY;  Surgeon: Swaziland, Peter M, MD;  Location: [facility] INVASIVE CV LAB;  Service: Cardiovascular;  Laterality: N/A;   LEFT HEART CATHETERIZATION WITH CORONARY ANGIOGRAM N/A 11/22/2013   Procedure: LEFT HEART CATHETERIZATION WITH CORONARY ANGIOGRAM;  Surgeon: [name], MD;  Location: [facility] CATH  LAB;  Service: Cardiovascular;  Laterality: N/A;   TEE WITHOUT CARDIOVERSION N/A 08/22/2015   Procedure: TRANSESOPHAGEAL ECHOCARDIOGRAM (TEE);  Surgeon: Vesta Mixer, MD;  Location: [facility] ENDOSCOPY;  Service: Cardiovascular;  Laterality: N/A;    Family History  Problem Relation Age of Onset   Hypertension Mother    Heart failure Mother    Hypertension Father    Colon cancer Father 64    Hypertension Brother    Colon cancer Paternal Uncle    Heart disease Son    Heart disease Son     Social History   Socioeconomic History   Marital status: Married    Spouse name: Not on file   Number of children: 3   Years of education: Not on file   Highest education level: Not on file  Occupational History   Occupation: Museum/gallery conservator  Tobacco Use   Smoking status: Some Days    Current packs/day: 1.50    Types: Cigarettes   Smokeless tobacco: Never  Vaping Use   Vaping status: Never Used  Substance and Sexual Activity   Alcohol use: No    Alcohol/week: 0.0 standard drinks of alcohol   Drug use: No   Sexual activity: Not Currently  Other Topics Concern   Not on file  Social History Narrative   Not on file   Social Determinants of Health   Financial Resource Strain: Medium Risk (03/09/2023)   Overall Financial Resource Strain (CARDIA)    Difficulty of Paying Living Expenses: Somewhat hard  Food Insecurity: Food Insecurity Present (03/09/2023)   Hunger Vital Sign    Worried About Running Out of Food in the Last Year: Often true    Ran Out of Food in the Last Year: Often true  Transportation Needs: No Transportation Needs (03/09/2023)   PRAPARE - Administrator, Civil Service (Medical): No    Lack of Transportation (Non-Medical): No  Physical Activity: Inactive (03/09/2023)   Exercise Vital Sign    Days of Exercise per Week: 0 days    Minutes of Exercise per Session: 0 min  Stress: Stress Concern Present (03/09/2023)   [employer] of Occupational Health - Occupational Stress Questionnaire    Feeling of Stress : To some extent  Social Connections: Moderately Isolated (03/09/2023)   Social Connection and Isolation Panel [NHANES]    Frequency of Communication with Friends and Family: Once a week    Frequency of Social Gatherings with Friends and Family: Never    Attends Religious Services: Never    Database administrator or Organizations:  Yes    Attends Engineer, structural: 1 to 4 times per year    Marital Status: Married    Allergies  Allergen Reactions   Lisinopril Swelling    Facial swelling    Outpatient Medications Prior to Visit  Medication Sig Dispense Refill   amLODipine (NORVASC) 10 MG tablet TAKE 1 TABLET BY MOUTH EVERY DAY 90 tablet 0   clopidogrel (PLAVIX) 75 MG tablet TAKE 1 TABLET (75 MG TOTAL) BY MOUTH DAILY. 90 tablet 3   Evolocumab (REPATHA SURECLICK) 140 MG/ML SOAJ Inject 140 mg into the skin every 14 (fourteen) days. 6 mL 3   losartan-hydrochlorothiazide (HYZAAR) 100-25 MG tablet TAKE 1 TABLET BY MOUTH DAILY. 90 tablet 2   naproxen (NAPROSYN) 500 MG tablet Take by mouth as needed for moderate pain.     nicotine polacrilex (NICORETTE MINI) 4 MG lozenge Use 1-3 daily to quit smoking 100 tablet 4  nitroGLYCERIN (NITROSTAT) 0.4 MG SL tablet Place 1 tablet (0.4 mg total) under the tongue every 5 (five) minutes as needed for chest pain. 25 tablet 3   potassium chloride SA (KLOR-CON M) 20 MEQ tablet Take 2 tablets (40 mEq total) by mouth daily. X 7 days 14 tablet 0   pyridOXINE (VITAMIN B6) 100 MG tablet Take 1 tablet (100 mg total) by mouth daily. 60 tablet 1   ranolazine (RANEXA) 500 MG 12 hr tablet TAKE 1 TABLET (500 MG TOTAL) BY MOUTH 2 (TWO) TIMES DAILY. 180 tablet 1   rosuvastatin (CRESTOR) 40 MG tablet TAKE 1 TABLET (40 MG TOTAL) BY MOUTH DAILY. 90 tablet 2   gabapentin (NEURONTIN) 300 MG capsule Take 1 capsule (300 mg total) by mouth 2 (two) times daily. 60 capsule 1   No facility-administered medications prior to visit.     ROS Review of Systems  Constitutional:  Positive for unexpected weight change. Negative for activity change and appetite change.  HENT:  Negative for sinus pressure and sore throat.   Respiratory:  Negative for chest tightness, shortness of breath and wheezing.   Cardiovascular:  Negative for chest pain and palpitations.  Gastrointestinal:  Negative for abdominal  distention, abdominal pain and constipation.  Genitourinary: Negative.   Musculoskeletal: Negative.   Psychiatric/Behavioral:  Positive for dysphoric mood. Negative for behavioral problems.     Objective:  BP 129/73   Pulse 60   Ht 5' 7.5" (1.715 m)   Wt 162 lb (73.5 kg)   SpO2 100%   BMI 25.00 kg/m      03/09/2023    8:52 AM 02/16/2023   10:21 AM 02/16/2023   10:10 AM  BP/Weight  Systolic BP 129 127 132  Diastolic BP 73 89 81  Wt. (Lbs) 162    BMI 25 kg/m2        Physical Exam Constitutional:      Appearance: He is well-developed.  Cardiovascular:     Rate and Rhythm: Normal rate.     Heart sounds: Normal heart sounds. No murmur heard. Pulmonary:     Effort: Pulmonary effort is normal.     Breath sounds: Normal breath sounds. No wheezing or rales.  Chest:     Chest wall: No tenderness.  Abdominal:     General: Bowel sounds are normal. There is no distension.     Palpations: Abdomen is soft. There is no mass.     Tenderness: There is no abdominal tenderness.  Musculoskeletal:        General: Normal range of motion.     Right lower leg: No edema.     Left lower leg: No edema.  Neurological:     Mental Status: He is alert and oriented to person, place, and time.  Psychiatric:     Comments: Dysphoric mood        Latest Ref Rng & Units 02/08/2023    3:14 PM 01/21/2023    2:51 PM 06/23/2022    9:02 AM  CMP  Glucose 70 - 99 mg/dL 93  68  81   BUN 6 - 23 mg/dL 17  14  12    Creatinine 0.40 - 1.50 mg/dL 0.62  6.94  8.54   Sodium 135 - 145 mEq/L 138  141  140   Potassium 3.5 - 5.1 mEq/L 3.7  3.3  3.8   Chloride 96 - 112 mEq/L 101  105  102   CO2 19 - 32 mEq/L 29  29  22  Calcium 8.4 - 10.5 mg/dL 9.7  9.1  9.5   Total Protein 6.0 - 8.3 g/dL  7.1  7.1   Total Bilirubin 0.2 - 1.2 mg/dL  0.7  0.6   Alkaline Phos 39 - 117 U/L  62  81   AST 0 - 37 U/L  26  18   ALT 0 - 53 U/L  15  12     Lipid Panel     Component Value Date/Time   CHOL 102 06/23/2022 0902    TRIG 36 06/23/2022 0902   HDL 60 06/23/2022 0902   CHOLHDL 1.7 06/23/2022 0902   CHOLHDL 1.8 05/12/2019 0317   VLDL 7 05/12/2019 0317   LDLCALC 32 06/23/2022 0902    CBC    Component Value Date/Time   WBC 5.3 01/21/2023 1451   RBC 3.61 (L) 01/21/2023 1451   HGB 10.9 (L) 01/21/2023 1451   HGB 12.0 (L) 08/25/2022 0949   HCT 33.3 (L) 01/21/2023 1451   HCT 36.3 (L) 08/25/2022 0949   PLT 232.0 01/21/2023 1451   PLT 288 08/25/2022 0949   MCV 92.0 01/21/2023 1451   MCV 90 08/25/2022 0949   MCH 29.6 08/25/2022 0949   MCH 29.8 03/16/2021 1406   MCHC 32.7 01/21/2023 1451   RDW 12.2 01/21/2023 1451   RDW 11.9 08/25/2022 0949   LYMPHSABS 1.9 01/21/2023 1451   LYMPHSABS 2.1 10/08/2021 1525   MONOABS 0.3 01/21/2023 1451   EOSABS 0.1 01/21/2023 1451   EOSABS 0.2 10/08/2021 1525   BASOSABS 0.0 01/21/2023 1451   BASOSABS 0.0 10/08/2021 1525    Lab Results  Component Value Date   HGBA1C 5.3 04/29/2019    Lab Results  Component Value Date   TSH 0.96 01/21/2023    Assessment & Plan:      Unintentional Weight Loss Loss of 16 pounds over the past 8 months with decreased appetite. No associated cough, shortness of breath, or diarrhea. Last thyroid function test in August was normal. -Malignancy workup including screening for prostate and lung cancer -Abdominal CT if workup is unrevealing -Order PSA test (last 3.8 two years ago). -Order CT chest for lung cancer screening given smoking history.  Depression Reports feeling overwhelmed due to family responsibilities and financial stress. Expresses feelings of exhaustion and occasional thoughts of life being better if not present, but denies any suicidal ideation or plans. -Start Wellbutrin for depressive symptoms and to aid in smoking cessation. -Refer to clinic therapist for further evaluation and management.  Coronary Artery Disease and Hypertrophic Cardiomyopathy Asymptomatic Managed by a cardiologist. No new symptoms  reported. -Continue current management.  Hypertension Controlled Continue antihypertensives Counseled on blood pressure goal of less than 130/80, low-sodium, DASH diet, medication compliance, 150 minutes of moderate intensity exercise per week. Discussed medication compliance, adverse effects.  Follow-up in 3 months to assess response to Wellbutrin and ongoing management of depression.          Meds ordered this encounter  Medications   buPROPion (WELLBUTRIN XL) 150 MG 24 hr tablet    Sig: Take 1 tablet (150 mg total) by mouth daily.    Dispense:  90 tablet    Refill:  1    Follow-up: Return in about 3 months (around 06/09/2023) for Depression.       Hoy Register, MD, FAAFP. [facility] [place], [place] [phone]   03/09/2023, 1:03 PM

## 2023-03-09 NOTE — Patient Instructions (Signed)

## 2023-03-10 ENCOUNTER — Other Ambulatory Visit: Payer: Self-pay | Admitting: Family Medicine

## 2023-03-10 DIAGNOSIS — R972 Elevated prostate specific antigen [PSA]: Secondary | ICD-10-CM

## 2023-03-10 LAB — TSH: TSH: 1.35 u[IU]/mL (ref 0.450–4.500)

## 2023-03-10 LAB — PSA, TOTAL AND FREE
PSA, Free Pct: 18.1 %
PSA, Free: 0.85 ng/mL
Prostate Specific Ag, Serum: 4.7 ng/mL — ABNORMAL HIGH (ref 0.0–4.0)

## 2023-03-10 LAB — T4, FREE: Free T4: 1.21 ng/dL (ref 0.82–1.77)

## 2023-03-10 LAB — T3: T3, Total: 135 ng/dL (ref 71–180)

## 2023-03-10 NOTE — BH Specialist Note (Signed)
Integrated Behavioral Health Initial In-Person Visit  MRN: [id number] Name: [name]  Number of Integrated Behavioral Health Clinician visits: 1- Initial Visit  Session Start time: 1005    Session End time: 1055  Total time in minutes: 50   Types of Service: Individual psychotherapy and Introduction only  Interpretor:No. Interpretor Name and Language: n/a   Warm Hand Off Completed.    Subjective: [name] is a 62 y.o. male accompanied by  himself. Patient was referred by PCP for depression during a warm handoff after his routine visit. Patient reports the following symptoms/concerns: depression and tearful Duration of problem: all his life; Severity of problem: moderate  Objective: Mood: Depressed and Affect: Depressed and Tearful Risk of harm to self or others: Self-harm thoughts  Life Context: Family and Social: Pt lives with his wife and 90yrs old nephew. Comes from a large family of sibilings. 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 Self-Care: non reported Life Changes: recently thought he had cancer but it was not, caring for his great nephew and consistently helping his family with their financial needs.  Patient and/or Family's Strengths/Protective Factors: Concrete supports in place (healthy food, safe environments, etc.) and Parental Resilience  Goals Addressed: Patient will: Reduce symptoms of: depression Increase knowledge and/or ability of: coping skills and self-management skills  Demonstrate ability to: Increase healthy adjustment to current life circumstances and Increase adequate support systems for patient/family  Progress towards Goals: Ongoing  Interventions: Interventions utilized: Motivational Interviewing and Supportive Counseling  Standardized Assessments completed:  pt completed during earlier visit. - Exploration of Suicidal Ideation: Assessed the client's  current risk for suicide. While he acknowledges suicidal thoughts, he emphasized that his grandchildren and nephew are reasons for staying alive, which indicates protective factors. Emphasized the importance of maintaining open communication if suicidal thoughts intensify. - Psychoeducation on Depression: Provided education on the nature of depression, including how chronic stress can exacerbate symptoms. Discussed the impact of his financial and family-related stressors on his mood. - Support System Identification: Explored the client's existing support systems, including his wife. Encouraged him to strengthen his connection with her and explore potential outside support, such as peer groups or mental health services. - Coping Skills: Discussed and developed alternative coping strategies for stress besides smoking. Encouraged to meet his family half way with their needs if he is able to help. Role Play on  practicing to say NO.  Patient and/or Family Response: Client, a 62 year old male, presents today expressing ongoing feelings of depression and exhaustion, both mentally and financially, primarily due to stress related to family dynamics. Client reports experiencing depressive symptoms for the past 3 years, which have intensified over the last 2 years. He acknowledges having suicidal thoughts but denies any current plan or intent to harm himself or others. Client smokes when feeling stressed and describes his main reasons for keeping himself safe as his 5 grandchildren and his 67-year-old nephew, whom he and his wife are raising. He describes his wife as a support at times but expresses a general lack of strong social or emotional support.  Patient Centered Plan: Patient is on the following Treatment Plan(s):  Depression  Assessment: Patient currently experiencing: Client's presentation suggests moderate to severe depression, with reports of chronic stressors related to family and financial strain. His  affect is flat, and his mood is reported as "down" and "drained." He demonstrates protective factors, including his grandchildren and nephew, which appear to  be key reasons for avoiding self-harm. Though his suicidal ideation is passive, the chronic nature of his depression and lack of consistent support raise concern for future risk. Client does not exhibit psychosis or mania and denies intent to harm others. Tobacco use is reported as a coping mechanism when stressed..   Patient may benefit from: - Continue Monthly therapy sessions to address depressive symptoms and family stressors. - Ongoing monitoring of suicidal ideation and protective factors. - Further explore alternative coping mechanisms to replace smoking.  Plan: Follow up with behavioral health clinician on : 4 weeks Behavioral recommendations: - Continue Monthly therapy sessions to address depressive symptoms and family stressors. - Ongoing monitoring of suicidal ideation and protective factors. - Further explore alternative coping mechanisms to replace smoking. Referral(s): Integrated [facility] [facility] (In Clinic) "From scale of 1-10, how likely are you to follow plan?": most likely   [name], [place]

## 2023-03-10 NOTE — Telephone Encounter (Signed)
Met with pt during a warm hand of from Dr. Alvis Lemmings. Pt is experiencing some depression that is ongoing. Pt was able to meet with [facility]

## 2023-03-18 ENCOUNTER — Ambulatory Visit
Admission: RE | Admit: 2023-03-18 | Discharge: 2023-03-18 | Disposition: A | Discharge status: Home / Self Care | Payer: 59 | Source: Ambulatory Visit | Attending: Family Medicine | Admitting: Family Medicine

## 2023-03-18 DIAGNOSIS — F17210 Nicotine dependence, cigarettes, uncomplicated: Secondary | ICD-10-CM

## 2023-03-21 ENCOUNTER — Other Ambulatory Visit: Payer: Self-pay

## 2023-03-21 ENCOUNTER — Emergency Department (HOSPITAL_COMMUNITY): Payer: 59

## 2023-03-21 ENCOUNTER — Emergency Department (HOSPITAL_COMMUNITY)
Admission: EM | Admit: 2023-03-21 | Discharge: 2023-03-21 | Disposition: A | Discharge status: Home / Self Care | Payer: 59 | Attending: Emergency Medicine | Admitting: Emergency Medicine

## 2023-03-21 ENCOUNTER — Encounter (HOSPITAL_COMMUNITY): Payer: Self-pay

## 2023-03-21 ENCOUNTER — Ambulatory Visit: Payer: Self-pay

## 2023-03-21 DIAGNOSIS — K388 Other specified diseases of appendix: Secondary | ICD-10-CM

## 2023-03-21 DIAGNOSIS — R339 Retention of urine, unspecified: Secondary | ICD-10-CM | POA: Diagnosis present

## 2023-03-21 DIAGNOSIS — R109 Unspecified abdominal pain: Secondary | ICD-10-CM | POA: Diagnosis not present

## 2023-03-21 LAB — CBC WITH DIFFERENTIAL/PLATELET
Abs Immature Granulocytes: 0.03 10*3/uL (ref 0.00–0.07)
Basophils Absolute: 0 10*3/uL (ref 0.0–0.1)
Basophils Relative: 0 %
Eosinophils Absolute: 0.1 10*3/uL (ref 0.0–0.5)
Eosinophils Relative: 1 %
HCT: 34 % — ABNORMAL LOW (ref 39.0–52.0)
Hemoglobin: 11.4 g/dL — ABNORMAL LOW (ref 13.0–17.0)
Immature Granulocytes: 0 %
Lymphocytes Relative: 22 %
Lymphs Abs: 1.8 10*3/uL (ref 0.7–4.0)
MCH: 31.3 pg (ref 26.0–34.0)
MCHC: 33.5 g/dL (ref 30.0–36.0)
MCV: 93.4 fL (ref 80.0–100.0)
Monocytes Absolute: 0.6 10*3/uL (ref 0.1–1.0)
Monocytes Relative: 7 %
Neutro Abs: 5.7 10*3/uL (ref 1.7–7.7)
Neutrophils Relative %: 70 %
Platelets: 211 10*3/uL (ref 150–400)
RBC: 3.64 MIL/uL — ABNORMAL LOW (ref 4.22–5.81)
RDW: 11.9 % (ref 11.5–15.5)
WBC: 8.3 10*3/uL (ref 4.0–10.5)
nRBC: 0 % (ref 0.0–0.2)

## 2023-03-21 LAB — BASIC METABOLIC PANEL
Anion gap: 8 (ref 5–15)
BUN: 14 mg/dL (ref 8–23)
CO2: 27 mmol/L (ref 22–32)
Calcium: 9.1 mg/dL (ref 8.9–10.3)
Chloride: 101 mmol/L (ref 98–111)
Creatinine, Ser: 1.35 mg/dL — ABNORMAL HIGH (ref 0.61–1.24)
GFR, Estimated: 60 mL/min — ABNORMAL LOW (ref >60)
Glucose, Bld: 98 mg/dL (ref 70–99)
Potassium: 3.5 mmol/L (ref 3.5–5.1)
Sodium: 136 mmol/L (ref 135–145)

## 2023-03-21 MED ORDER — HYDROCODONE-ACETAMINOPHEN 5-325 MG PO TABS
1.0000 | ORAL_TABLET | Freq: Once | ORAL | Status: AC
  Administered 2023-03-21: 1 via ORAL
  Filled 2023-03-21: qty 1

## 2023-03-21 MED ORDER — ONDANSETRON 8 MG PO TBDP
8.0000 mg | ORAL_TABLET | Freq: Once | ORAL | Status: AC
  Administered 2023-03-21: 8 mg via ORAL
  Filled 2023-03-21: qty 1

## 2023-03-21 NOTE — Discharge Instructions (Addendum)
Follow-up with urology in 1 to 2 weeks. Contact information in this discharge paperwork.  Keep the Foley catheter in place.  For any concerning symptoms return to the emergency room.  As discussed, CT showed ". Hyperattenuating intraluminal material is seen in the region of the cecum, possibly an appendicolith with mildly dilation of the suspected appendix up to 13 mm in diameter. 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 Please also call them tomorrow to establish an appointment.

## 2023-03-21 NOTE — ED Notes (Signed)
Catheter bag was switched for leg bag, and Pt was taught about proper catheter maintenance prior to discharge

## 2023-03-21 NOTE — ED Provider Notes (Signed)
[place] EM[facility]ENCY DEPARTMENT AT [facility] Provider Note   CSN: [id number] Arrival date & time: 03/21/23  1119     History  Chief Complaint  Patient presents with   Urinary Retention    [name] is a 62 y.o. male.  62 year old male presents today for concern of urinary retention for the past 3 days.  Does have history of BPH.  No fever, dysuria, flank pain.  The history is provided by the patient. No language interpreter was used.       Home Medications Prior to Admission medications   Medication Sig Start Date End Date Taking? Authorizing Provider  amLODipine (NORVASC) 10 MG tablet TAKE 1 TABLET BY MOUTH EVERY DAY 02/21/23   Storm Frisk, MD  buPROPion (WELLBUTRIN XL) 150 MG 24 hr tablet Take 1 tablet (150 mg total) by mouth daily. 03/09/23   Hoy Register, MD  clopidogrel (PLAVIX) 75 MG tablet TAKE 1 TABLET (75 MG TOTAL) BY MOUTH DAILY. 06/23/22 06/23/23  Anders Simmonds, PA-C  Evolocumab (REPATHA SURECLICK) 140 MG/ML SOAJ Inject 140 mg into the skin every 14 (fourteen) days. 10/08/22   Chandrasekhar, Rondel Jumbo, MD  gabapentin (NEURONTIN) 300 MG capsule Take 1 capsule (300 mg total) by mouth 2 (two) times daily. 10/20/22 02/16/23  Storm Frisk, MD  losartan-hydrochlorothiazide (HYZAAR) 100-25 MG tablet TAKE 1 TABLET BY MOUTH DAILY. 06/23/22   Anders Simmonds, PA-C  naproxen (NAPROSYN) 500 MG tablet Take by mouth as needed for moderate pain. 10/15/15   [provider]  nicotine polacrilex (NICORETTE MINI) 4 MG lozenge Use 1-3 daily to quit smoking 03/12/22   Storm Frisk, MD  nitroGLYCERIN (NITROSTAT) 0.4 MG SL tablet Place 1 tablet (0.4 mg total) under the tongue every 5 (five) minutes as needed for chest pain. 08/25/22   Chandrasekhar, Lafayette Dragon A, MD  potassium chloride SA (KLOR-CON M) 20 MEQ tablet Take 2 tablets (40 mEq total) by mouth daily. X 7 days 01/26/23   Sherrilyn Rist, MD  pyridOXINE (VITAMIN B6) 100 MG tablet Take 1  tablet (100 mg total) by mouth daily. 05/10/22   Madelyn Brunner, DO  ranolazine (RANEXA) 500 MG 12 hr tablet TAKE 1 TABLET (500 MG TOTAL) BY MOUTH 2 (TWO) TIMES DAILY. 06/23/22 06/23/23  Anders Simmonds, PA-C  rosuvastatin (CRESTOR) 40 MG tablet TAKE 1 TABLET (40 MG TOTAL) BY MOUTH DAILY. 06/23/22 06/23/23  Anders Simmonds, PA-C      Allergies    Lisinopril    Review of Systems   Review of Systems  Constitutional:  Negative for fever.  Gastrointestinal:  Positive for abdominal pain. Negative for nausea and vomiting.  Genitourinary:  Positive for difficulty urinating. Negative for dysuria and flank pain.  All other systems reviewed and are negative.   Physical Exam Updated Vital Signs BP 117/84 (BP Location: Left Arm)   Pulse 70   Temp 98.3 F (36.8 C) (Oral)   Resp 14   Ht 5\' 8"  (1.727 m)   Wt 72.1 kg   SpO2 100%   BMI 24.18 kg/m  Physical Exam Vitals and nursing note reviewed.  Constitutional:      General: He is not in acute distress.    Appearance: Normal appearance. He is not ill-appearing.  HENT:     Head: Normocephalic and atraumatic.     Nose: Nose normal.  Eyes:     Conjunctiva/sclera: Conjunctivae normal.  Cardiovascular:     Rate and Rhythm: Normal rate and regular rhythm.  Heart sounds: Normal heart sounds.  Pulmonary:     Effort: Pulmonary effort is normal. No respiratory distress.     Breath sounds: No wheezing.  Abdominal:     General: There is distension.     Palpations: Abdomen is soft.     Tenderness: There is abdominal tenderness. There is guarding. There is no rebound.     Comments: Distention, tenderness, guarding in the suprapubic region  Musculoskeletal:        General: No deformity. Normal range of motion.     Cervical back: Normal range of motion.  Skin:    Findings: No rash.  Neurological:     General: No focal deficit present.     Mental Status: He is alert and oriented to person, place, and time. Mental status is at baseline.      ED Results / Procedures / Treatments   Labs (all labs ordered are listed, but only abnormal results are displayed) Labs Reviewed  CBC WITH DIFFERENTIAL/PLATELET - Abnormal; Notable for the following components:      Result Value   RBC 3.64 (*)    Hemoglobin 11.4 (*)    HCT 34.0 (*)    All other components within normal limits  BASIC METABOLIC PANEL - Abnormal; Notable for the following components:   Creatinine, Ser 1.35 (*)    GFR, Estimated 60 (*)    All other components within normal limits    EKG None  Radiology No results found.  Procedures Procedures    Medications Ordered in ED Medications  HYDROcodone-acetaminophen (NORCO/VICODIN) 5-325 MG per tablet 1 tablet (1 tablet Oral Given 03/21/23 1236)  ondansetron (ZOFRAN-ODT) disintegrating tablet 8 mg (8 mg Oral Given 03/21/23 1237)    ED Course/ Medical Decision Making/ A&P                                 Medical Decision Making Amount and/or Complexity of Data Reviewed Labs: ordered. Radiology: ordered.  Risk Prescription drug management.   62 year old male presents today for concern of urinary retention.  Started 3 days ago.  Hemodynamically stable.  Does appear uncomfortable.  Distention tenderness noted to the suprapubic region.  Will obtain basic blood work.  Bladder scan showed 460 mL.  Will place Foley catheter.  Procedure performed by me due to patient stating it is difficult and he will not allow more than 1 attempt.  Successfully placed Foley catheter.  Will obtain CT renal stone study.  CBC is reassuring.  No leukocytosis.  BMP with mild renal insufficiency with creatinine of 1.35 otherwise without acute concern.  Pending CT renal stone study at the time of shift change.  Signed out to oncoming provider to follow-up on this.  If this is normal patient can discharge with urology follow-up.  Patient made aware of plan.  He is agreeable.   Final Clinical Impression(s) / ED Diagnoses Final  diagnoses:  Urinary retention    Rx / DC Orders ED Discharge Orders     None         Marita Kansas, PA-C 03/21/23 1529    Cathren Laine, MD 03/21/23 2126

## 2023-03-21 NOTE — ED Triage Notes (Signed)
Pt arrived endorsing abdominal pain and urinary retention. Has been unable to fully urinate for 3 days. States has been small dribbling. Denies hematuria. States usually urinates often from diuretics. Reports prostate issues being worked up now

## 2023-03-21 NOTE — Telephone Encounter (Signed)
Noted  

## 2023-03-21 NOTE — Telephone Encounter (Signed)
Chief Complaint: Painful urination, difficulty urinating Symptoms: No urine output since yesterday, abdominal pain when press lower abd, pain 8/10 Frequency: Onset Thursday a little, in bed all day yesterday Pertinent Negatives: Patient denies fever Disposition: [x] ED /[] Urgent Care (no appt availability in office) / [] Appointment(In office/virtual)/ []  [place] [facility]/ [] Home Care/ [] Refused Recommended Disposition /[] [place] [facility]/ []  Follow-up with PCP Additional Notes: patient is currently at work, advised to go to the ED as soon as possible because of not urinating, infection could go over the body. He says he will get off and drive himself, he's 5 minutes from [facility].   Summary: Difficulty urinating and discomfort while urinating   The spouse called in stating her husband has been having difficulty urinating and also discomfort while urinating starting this past weekend. She says the pain starts below the stomach and into his groin. He has an appt with a Urologist on November 26th and is on the wait list. Please assist patient further as he doesn't know what to do in the mean time.      Reason for Disposition  [1] Unable to urinate (or only a few drops) > 4 hours AND [2] bladder feels very full (e.g., feels blocked with strong urge to urinate; palpable bladder)  Answer Assessment - Initial Assessment Questions 1. SEVERITY: "How bad is the pain?"  (e.g., Scale 1-10; mild, moderate, or severe)   - MILD (1-3): Complains slightly about urination hurting.   - MODERATE (4-7): Interferes with normal activities.     - SEVERE (8-10): Excruciating, unwilling or unable to urinate because of the pain.      8 2. FREQUENCY: "How many times have you had painful urination today?"      No urine since yesterday 3. ONSET: "When did the painful urination start?"      Thursday 4. FEVER: "Do you have a fever?" If Yes, ask: "What is your temperature, how was it measured, and when did  it start?"     No 5. PAST UTI: "Have you had a urine infection before?" If Yes, ask: "When was the last time?" and "What happened that time?"      No 6. OTHER SYMPTOMS: "Do you have any other symptoms?" (e.g., flank pain, penis discharge, scrotal pain, blood in urine)     Pain to lower abdomen  Protocols used: Urination Pain - Male-A-AH

## 2023-03-21 NOTE — ED Provider Notes (Signed)
   Accepted handoff at shift change from [facility]. Please see prior provider note for more detail.   Briefly: Patient is 62 y.o. "concern of urinary retention for the past 3 days. Does have history of BPH. No fever, dysuria, flank pain."  Plan:  - dispo pending CT renal stone study - patient coming to ED today concerned for urinary retention x3 days. Foley catheter in place. Obtained CT renal stone study to check for stones causing urinary retention. Incidentally found "Hyperattenuating intraluminal material is seen in the region of the cecum, possibly an appendicolith with mildly dilation of the suspected appendix up to 13 mm in diameter." No leukocytosis - lab work reassuring. - Consulted with Dr. Annett Fabian (general surgery) who believes that patient's CT finding is not d/t appendicitis given lack of leukocytosis and stable vitals signs. He recommends following up outpatient with GI for possible colonoscopy.  - patient follows with GI - has colonoscopy recently with multiple removed polyps. Patient stating that he will contact GI tomorrow for follow up appointment. Patient states he is ready to go home.  -Patient also verbalized understanding that he will need to follow-up with the urologist in the next 1 - 2 weeks. -Patient afebrile with stable vitals.  Provided with return precautions.  Discharged good condition.    Dorthy Cooler, [place] 03/21/23 2344    Cathren Laine, MD 03/22/23 1016

## 2023-03-28 ENCOUNTER — Ambulatory Visit: Payer: Self-pay

## 2023-03-28 NOTE — Telephone Encounter (Signed)
     Chief Complaint: Foley catheter inserted in ED 03/21/23. Has blood and blood clots in drainage bag. Urine leaking around insertion site. Urology appointment 04/04/23. Symptoms: Above Frequency: Weekend Pertinent Negatives: Patient denies pain Disposition: [] ED /[] Urgent Care (no appt availability in office) / [] Appointment(In office/virtual)/ []  [place] [facility]/ [] Home Care/ [] Refused Recommended Disposition /[x] [place] [facility]/ []  Follow-up with PCP Additional Notes: Agrees with Mobile Unit.  Reason for Disposition  [1] Bloody or red-colored urine AND [2] no recent prostate or bladder surgery  (Exception: Brief episode and urine now clear.)  Answer Assessment - Initial Assessment Questions 1. SYMPTOMS: "What symptoms are you concerned about?"     Leaking urine, blood clots 2. ONSET:  "When did the symptoms start?"     03/21/23 in ED 3. FEVER: "Is there a fever?" If Yes, ask: "What is the temperature, how was it measured, and when did it start?"     No 4. ABDOMEN PAIN: "Is there any abdomen pain?" (e.g., Scale 1-10; or mild, moderate, severe)     No 5. URINE COLOR: "What color is the urine?"  "Is there blood present in the urine?" (e.g., clear, yellow, cloudy, tea-colored, blood streaks, bright red)     Dark x 3 days 6. URINE AMOUNT: "When did you last empty the urine from the collection bag?" "How much urine was in the bag at that time?" How much urine is in the collection bag now?"     Emptying bag 2-3 x daily 7. INSERTION: "How long have you (they) had the catheter?"     03/21/23 8. OTHER SYMPTOMS: "Are there any other symptoms?" (e.g., abdomen swelling, back pain, bladder spasms, constipation, foul smelling urine, leaking of urine)      No 9. MEDICINES: "Are you taking any medicines to treat urinary problems?" (e.g., antibiotics for a urinary tract infection, medicines to treat bladder spasms)      N/a 10. PREGNANCY: "Is there any chance you are pregnant?" "When  was your last menstrual period?"       N/a  Protocols used: Urinary Catheter (e.g., Foley) Symptoms and Questions-A-AH

## 2023-03-30 ENCOUNTER — Ambulatory Visit: Payer: 59 | Admitting: Physician Assistant

## 2023-04-06 ENCOUNTER — Ambulatory Visit: Payer: Self-pay | Admitting: Licensed Clinical Social Worker

## 2023-04-11 ENCOUNTER — Encounter: Payer: Self-pay | Admitting: Internal Medicine

## 2023-04-16 IMAGING — DX DG CHEST 2V
2 series · 2 of 2 positions shown · non-contrast
Comparison: 05/11/2019

CLINICAL DATA: Chest pain

EXAM:
CHEST - 2 VIEW

[w chest pa]
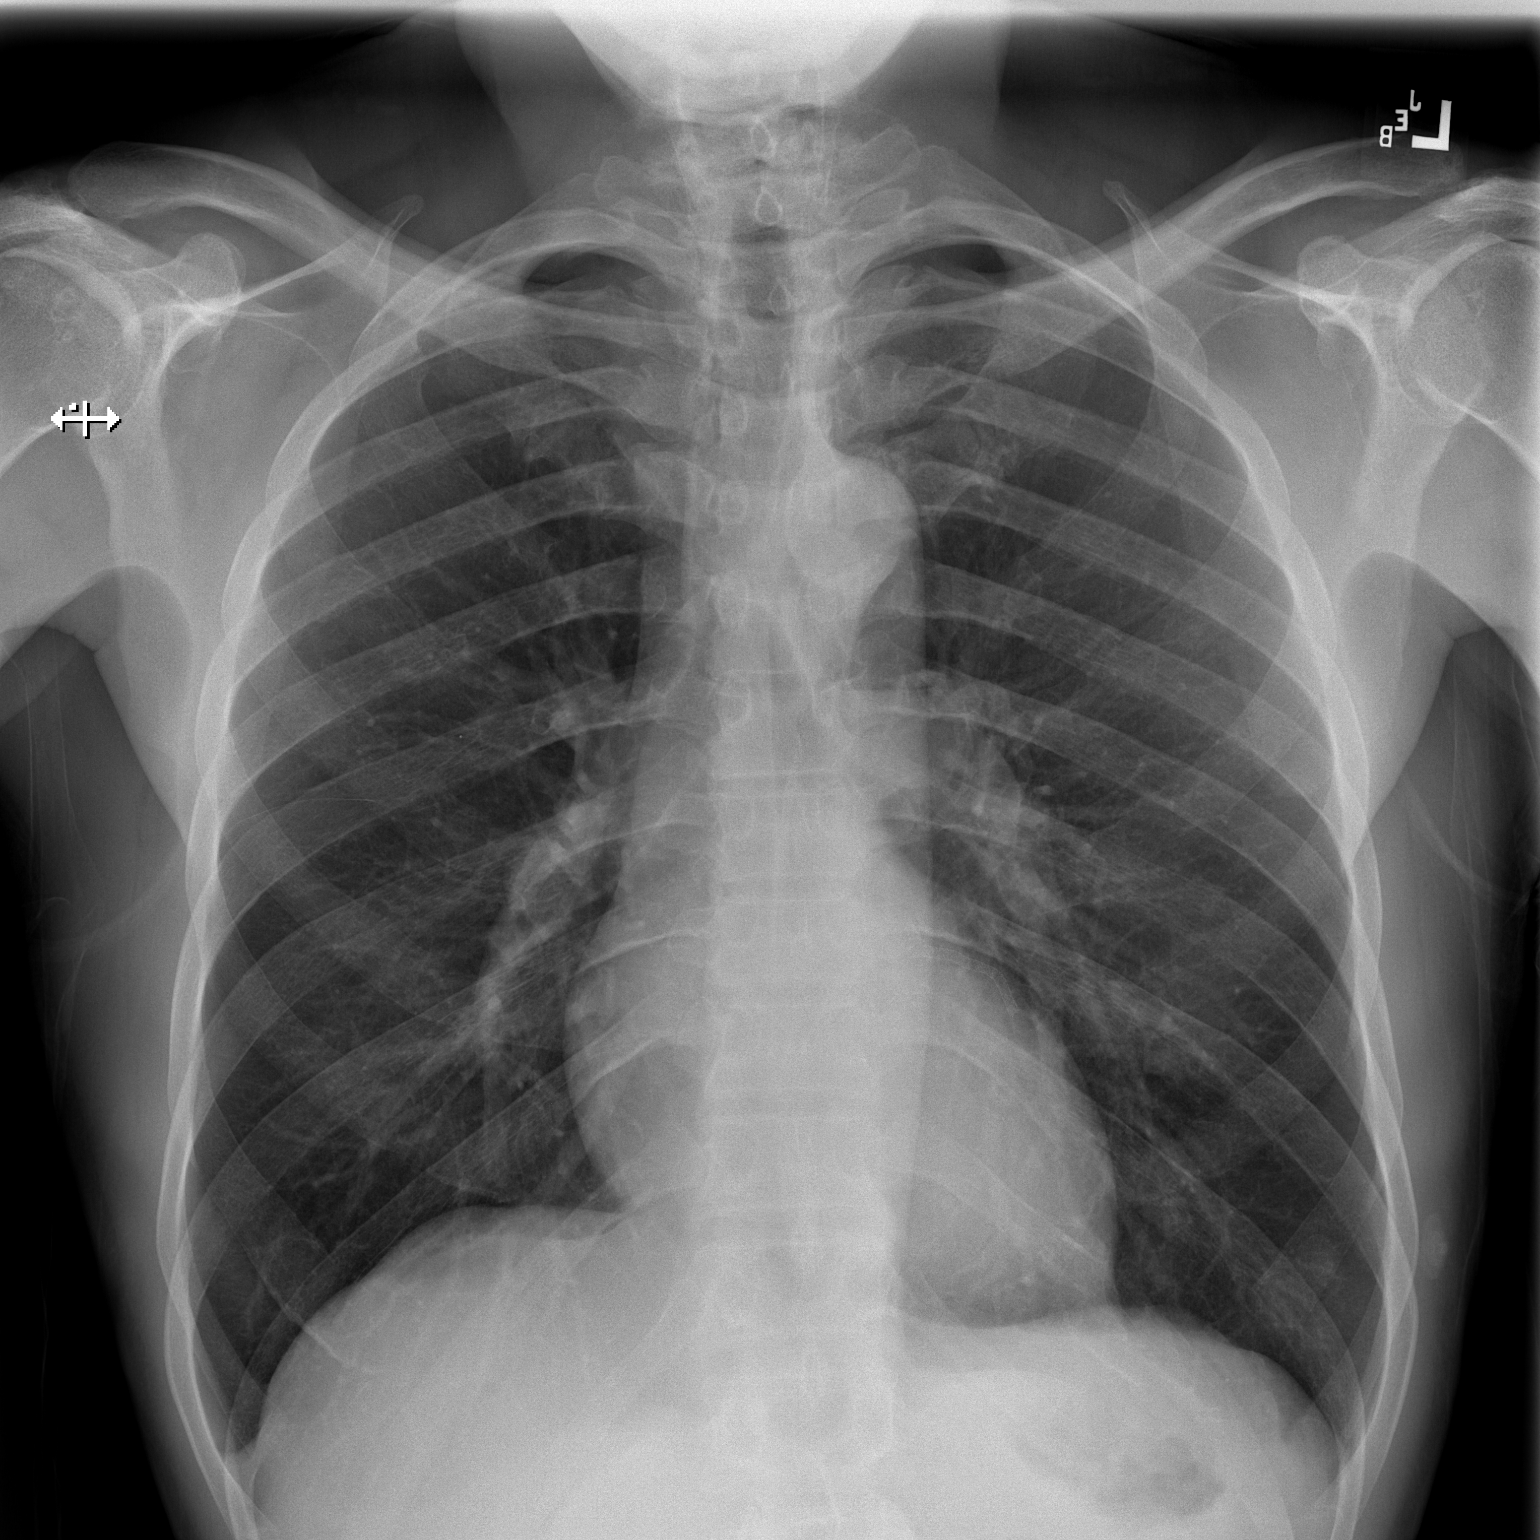

[w chest lat]
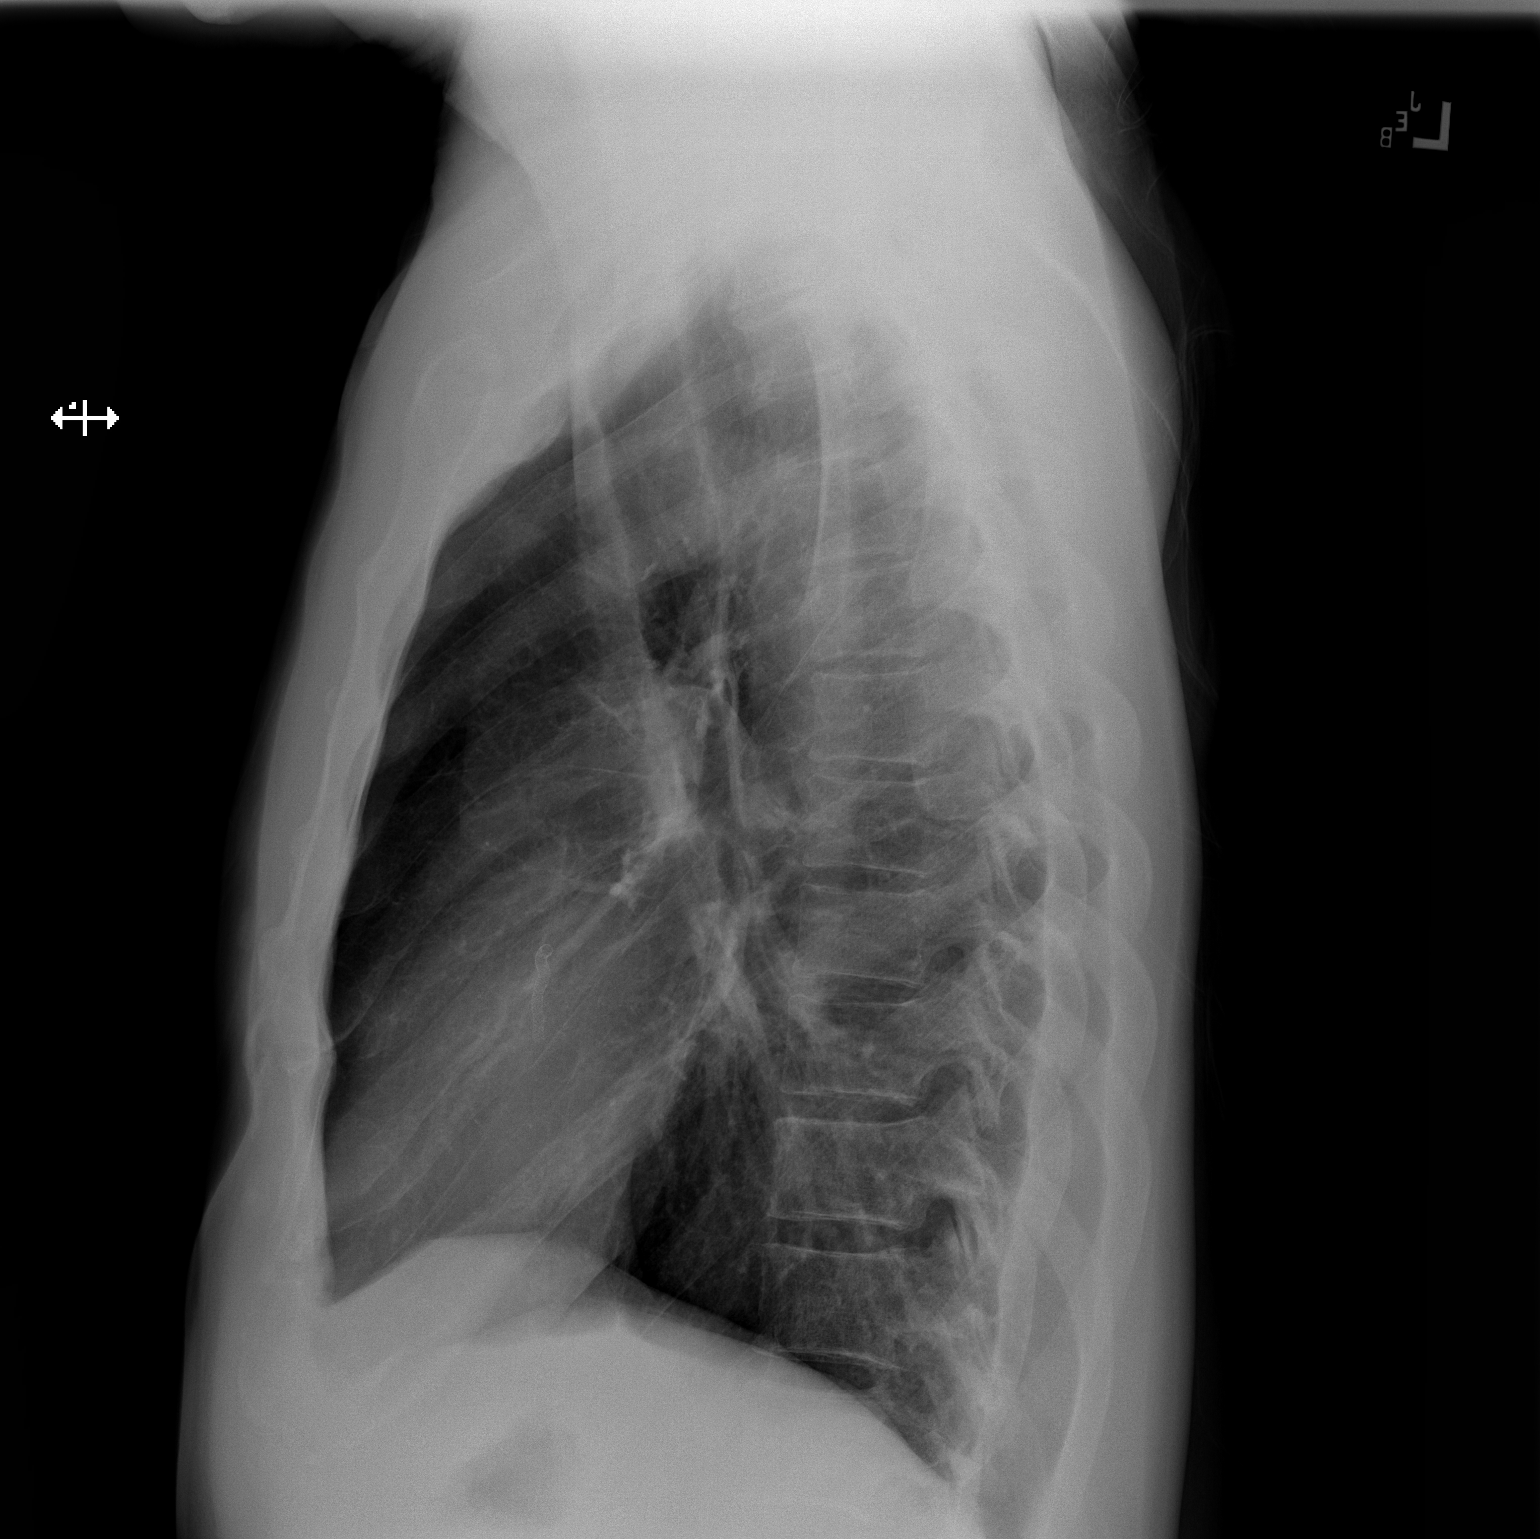

[2 of 2 positions shown; findings below may reference images not displayed]

FINDINGS: The heart size and mediastinal contours are within normal limits.
Both lungs are clear. The visualized skeletal structures are
unremarkable.
IMPRESSION: No acute abnormality of the lungs.

## 2023-04-18 ENCOUNTER — Other Ambulatory Visit: Payer: Self-pay | Admitting: Adult Health

## 2023-04-18 DIAGNOSIS — R972 Elevated prostate specific antigen [PSA]: Secondary | ICD-10-CM

## 2023-04-26 ENCOUNTER — Ambulatory Visit: Payer: 59 | Admitting: Critical Care Medicine

## 2023-05-04 NOTE — Progress Notes (Signed)
Patient was no-show

## 2023-05-10 ENCOUNTER — Ambulatory Visit: Payer: 59 | Admitting: Genetic Counselor

## 2023-05-10 ENCOUNTER — Encounter: Payer: Self-pay | Admitting: Gastroenterology

## 2023-05-10 ENCOUNTER — Other Ambulatory Visit: Payer: Self-pay | Admitting: Adult Health

## 2023-05-10 ENCOUNTER — Ambulatory Visit (INDEPENDENT_AMBULATORY_CARE_PROVIDER_SITE_OTHER): Payer: 59 | Admitting: Gastroenterology

## 2023-05-10 VITALS — BP 120/70 | HR 77 | Ht 68.0 in | Wt 166.0 lb

## 2023-05-10 DIAGNOSIS — Z860101 Personal history of adenomatous and serrated colon polyps: Secondary | ICD-10-CM

## 2023-05-10 DIAGNOSIS — K5909 Other constipation: Secondary | ICD-10-CM | POA: Diagnosis not present

## 2023-05-10 DIAGNOSIS — Z860100 Personal history of colon polyps, unspecified: Secondary | ICD-10-CM

## 2023-05-10 NOTE — Progress Notes (Signed)
Chief Complaint: Follow up of constipation Primary GI MD: Dr. Myrtie Neither  HPI: 62 year old male history of hypertrophic cardiomyopathy, CAD (on Plavix), stroke, presents for follow-up.  Last seen 01/21/2023 At that time patient was experiencing intermittent constipation ongoing for many years worse over the last year.  Going 3 days without a bowel movement.  Associated with intermittent lower abdominal pain.  Recommended fiber, increase water, IBgard, and MiraLAX 1 capful per day and set patient up for colonoscopy.  Colonoscopy 02/16/2023 - Hemorrhoids found on perianal exam.  - One diminutive polyp (tubular adenoma) in the transverse colon, removed with a cold snare. Resected and retrieved.  - One diminutive polyp (tubular adenoma) in the distal rectum, removed with a cold biopsy forceps. Resected and retrieved.  - Internal hemorrhoids.  - The examination was otherwise normal on direct and retroflexion views. - Repeat 7 years  CBC showed a normocytic anemia with normal iron levels, B12, and folic acid with a hemoglobin of 10.9.  Normal TSH and CMP  Patient states he has been taking miralax 1 capful daily and has had substantial improvement in his constipation. He has a bowel movement 3-4 times per week with no difficulty. He is feeling great and has no complaints today.  PREVIOUS GI WORKUP   Colonoscopy 08/2012 with Dr. Jarold Motto Normal, no polyps.  Repeat 5 years due to family history of colon cancer  Past Medical History:  Diagnosis Date   AC (acromioclavicular) arthritis 11/23/2018   Injected November 23, 2018   Apical variant hypertrophic cardiomyopathy (HCC)    CAD (coronary artery disease)    a. LHC in 2011 with severe dz in D1/D2 and very distal LAD--> too small for PCI and managed medically.  b.  LHC in 10/2013 after an abnormal nuclear study w/ non-obst dz/c. 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   HOH (hard of hearing)    rgiht ear   Hyperlipidemia    Hypertension    Migraine headache    Stroke [facility] [place])     Past Surgical History:  Procedure Laterality Date   CARDIAC CATHETERIZATION N/A 02/24/2016   Procedure: Left Heart Cath and Coronary Angiography;  Surgeon: Tonny Bollman, MD;  Location: [facility] INVASIVE CV LAB;  Service: Cardiovascular;  Laterality: N/A;   CARDIAC CATHETERIZATION N/A 02/24/2016   Procedure: Coronary Stent Intervention;  Surgeon: Tonny Bollman, MD;  Location: [facility] INVASIVE CV LAB;  Service: Cardiovascular;  Laterality: N/A;   CORONARY ANGIOPLASTY WITH STENT PLACEMENT  2017   CORONARY BALLOON ANGIOPLASTY N/A 04/30/2019   Procedure: CORONARY BALLOON ANGIOPLASTY;  Surgeon: Swaziland, Peter M, MD;  Location: [facility] INVASIVE CV LAB;  Service: Cardiovascular;  Laterality: N/A;   INGUINAL HERNIA REPAIR Left    LEFT HEART CATH AND CORONARY ANGIOGRAPHY N/A 04/30/2019   Procedure: LEFT HEART CATH AND CORONARY ANGIOGRAPHY;  Surgeon: Dolores Patty, MD;  Location: [facility] INVASIVE CV LAB;  Service: Cardiovascular;  Laterality: N/A;   LEFT HEART CATH AND CORONARY ANGIOGRAPHY N/A 05/14/2019   Procedure: LEFT HEART CATH AND CORONARY ANGIOGRAPHY;  Surgeon: Swaziland, Peter M, MD;  Location: [facility] INVASIVE CV LAB;  Service: Cardiovascular;  Laterality: N/A;   LEFT HEART CATHETERIZATION WITH CORONARY ANGIOGRAM N/A 11/22/2013   Procedure: LEFT HEART CATHETERIZATION WITH CORONARY ANGIOGRAM;  Surgeon: [name], MD;  Location: [facility] CATH LAB;  Service: Cardiovascular;  Laterality: N/A;   TEE WITHOUT CARDIOVERSION N/A 08/22/2015   Procedure: TRANSESOPHAGEAL ECHOCARDIOGRAM (TEE);  Surgeon: Vesta Mixer, MD;  Location: [facility] ENDOSCOPY;  Service:  Cardiovascular;  Laterality: N/A;    Current Outpatient Medications  Medication Sig Dispense Refill   amLODipine (NORVASC) 10 MG tablet TAKE 1 TABLET BY MOUTH EVERY DAY 90 tablet 0   buPROPion (WELLBUTRIN XL) 150 MG 24 hr tablet Take 1 tablet (150 mg total) by mouth daily. 90 tablet 1   clopidogrel (PLAVIX) 75 MG tablet TAKE 1 TABLET (75 MG TOTAL) BY MOUTH DAILY. 90 tablet 3   Evolocumab (REPATHA SURECLICK) 140 MG/ML SOAJ Inject 140 mg into the skin every 14 (fourteen) days. 6 mL 3   gabapentin (NEURONTIN) 300 MG capsule Take 1 capsule (300 mg total) by mouth 2 (two) times daily. 60 capsule 1   losartan-hydrochlorothiazide (HYZAAR) 100-25 MG tablet TAKE 1 TABLET BY MOUTH DAILY. 90 tablet 2   naproxen (NAPROSYN) 500 MG tablet Take by mouth as needed for moderate pain.     nicotine polacrilex (NICORETTE MINI) 4 MG lozenge Use 1-3 daily to quit smoking 100 tablet 4   nitroGLYCERIN (NITROSTAT) 0.4 MG SL tablet Place 1 tablet (0.4 mg total) under the tongue every 5 (five) minutes as needed for chest pain. 25 tablet 3   potassium chloride SA (KLOR-CON M) 20 MEQ tablet Take 2 tablets (40 mEq total) by mouth daily. X 7 days 14 tablet 0   pyridOXINE (VITAMIN B6) 100 MG tablet Take 1 tablet (100 mg total) by mouth daily. 60 tablet 1   ranolazine (RANEXA) 500 MG 12 hr tablet TAKE 1 TABLET (500 MG TOTAL) BY MOUTH 2 (TWO) TIMES DAILY. 180 tablet 1   rosuvastatin (CRESTOR) 40 MG tablet TAKE 1 TABLET (40 MG TOTAL) BY MOUTH DAILY. 90 tablet 2   No current facility-administered medications for this visit.    Allergies as of 05/10/2023 - Review Complete 03/21/2023  Allergen Reaction Noted   Lisinopril Swelling 10/15/2009    Family History  Problem Relation Age of Onset   Hypertension Mother    Heart failure Mother    Hypertension Father    Colon cancer Father 26   Hypertension Brother    Colon cancer Paternal Uncle    Heart disease Son    Heart disease Son     Social History   Socioeconomic  History   Marital status: Married    Spouse name: Not on file   Number of children: 3   Years of education: Not on file   Highest education level: Not on file  Occupational History   Occupation: Museum/gallery conservator  Tobacco Use   Smoking status: Some Days    Current packs/day: 1.50    Types: Cigarettes   Smokeless tobacco: Never  Vaping Use   Vaping status: Never Used  Substance and Sexual Activity   Alcohol use: No    Alcohol/week: 0.0 standard drinks of alcohol   Drug use: No   Sexual activity: Not Currently  Other Topics Concern   Not on file  Social History Narrative   Not on file   Social Determinants of Health   Financial Resource Strain: Medium Risk (03/09/2023)   Overall Financial Resource Strain (CARDIA)    Difficulty of Paying Living Expenses:  Somewhat hard  Food Insecurity: Food Insecurity Present (03/09/2023)   Hunger Vital Sign    Worried About Running Out of Food in the Last Year: Often true    Ran Out of Food in the Last Year: Often true  Transportation Needs: No Transportation Needs (03/09/2023)   PRAPARE - Administrator, Civil Service (Medical): No    Lack of Transportation (Non-Medical): No  Physical Activity: Inactive (03/09/2023)   Exercise Vital Sign    Days of Exercise per Week: 0 days    Minutes of Exercise per Session: 0 min  Stress: Stress Concern Present (03/09/2023)   [employer] of Occupational Health - Occupational Stress Questionnaire    Feeling of Stress : To some extent  Social Connections: Moderately Isolated (03/09/2023)   Social Connection and Isolation Panel [NHANES]    Frequency of Communication with Friends and Family: Once a week    Frequency of Social Gatherings with Friends and Family: Never    Attends Religious Services: Never    Database administrator or Organizations: Yes    Attends Engineer, structural: 1 to 4 times per year    Marital Status: Married  Catering manager Violence: Not At Risk  (03/09/2023)   Humiliation, Afraid, Rape, and Kick questionnaire    Fear of Current or Ex-Partner: No    Emotionally Abused: No    Physically Abused: No    Sexually Abused: No    Review of Systems:    Constitutional: No weight loss, fever, chills, weakness or fatigue HEENT: Eyes: No change in vision               Ears, Nose, Throat:  No change in hearing or congestion Skin: No rash or itching Cardiovascular: No chest pain, chest pressure or palpitations   Respiratory: No SOB or cough Gastrointestinal: See HPI and otherwise negative Genitourinary: No dysuria or change in urinary frequency Neurological: No headache, dizziness or syncope Musculoskeletal: No new muscle or joint pain Hematologic: No bleeding or bruising Psychiatric: No history of depression or anxiety    Physical Exam:  Vital signs: There were no vitals taken for this visit.  Constitutional: NAD, Well developed, Well nourished, alert and cooperative Head:  Normocephalic and atraumatic. Eyes:   PEERL, EOMI. No icterus. Conjunctiva pink. Respiratory: Respirations even and unlabored. Lungs clear to auscultation bilaterally.   No wheezes, crackles, or rhonchi.  Cardiovascular:  Regular rate and rhythm. No peripheral edema, cyanosis or pallor.  Gastrointestinal:  Soft, nondistended, nontender. No rebound or guarding. Normal bowel sounds. No appreciable masses or hepatomegaly. Rectal:  Not performed.  Msk:  Symmetrical without gross deformities. Without edema, no deformity or joint abnormality.  Neurologic:  Alert and  oriented x4;  grossly normal neurologically.  Skin:   Dry and intact without significant lesions or rashes. Psychiatric: Oriented to person, place and time. Demonstrates good judgement and reason without abnormal affect or behaviors.   RELEVANT LABS AND IMAGING: CBC    Component Value Date/Time   WBC 8.3 03/21/2023 1232   RBC 3.64 (L) 03/21/2023 1232   HGB 11.4 (L) 03/21/2023 1232   HGB 12.0 (L)  08/25/2022 0949   HCT 34.0 (L) 03/21/2023 1232   HCT 36.3 (L) 08/25/2022 0949   PLT 211 03/21/2023 1232   PLT 288 08/25/2022 0949   MCV 93.4 03/21/2023 1232   MCV 90 08/25/2022 0949   MCH 31.3 03/21/2023 1232   MCHC 33.5 03/21/2023 1232   RDW 11.9 03/21/2023 1232  RDW 11.9 08/25/2022 0949   LYMPHSABS 1.8 03/21/2023 1232   LYMPHSABS 2.1 10/08/2021 1525   MONOABS 0.6 03/21/2023 1232   EOSABS 0.1 03/21/2023 1232   EOSABS 0.2 10/08/2021 1525   BASOSABS 0.0 03/21/2023 1232   BASOSABS 0.0 10/08/2021 1525    CMP     Component Value Date/Time   NA 136 03/21/2023 1232   NA 140 06/23/2022 0902   K 3.5 03/21/2023 1232   CL 101 03/21/2023 1232   CO2 27 03/21/2023 1232   GLUCOSE 98 03/21/2023 1232   BUN 14 03/21/2023 1232   BUN 12 06/23/2022 0902   CREATININE 1.35 (H) 03/21/2023 1232   CREATININE 1.33 03/09/2016 1032   CALCIUM 9.1 03/21/2023 1232   PROT 7.1 01/21/2023 1451   PROT 7.1 06/23/2022 0902   ALBUMIN 4.2 01/21/2023 1451   ALBUMIN 4.6 06/23/2022 0902   AST 26 01/21/2023 1451   ALT 15 01/21/2023 1451   ALKPHOS 62 01/21/2023 1451   BILITOT 0.7 01/21/2023 1451   BILITOT 0.6 06/23/2022 0902   GFRNONAA 60 (L) 03/21/2023 1232   GFRAA 69 05/07/2020 0904     Assessment/Plan:   62 year old male history of hypertrophic cardiomyopathy, CAD (on Plavix), stroke, presenting with chronic intermittent constipation that worsened over the last year underwent colonoscopy which showed 2 small tubular adenomas now having substantial improvement with MiraLAX 1 capful per day  Chronic constipation Currently well controlled on miralax. Recent colonoscopy was reassuring. -- continue miralax daily -- continue to increase water, fiber, and exercise. -- if worsening or change in symptoms please let us know -- follow up prn  History of colon polyps Due for repeat 2031  Boone Master, PA-C [facility] 05/10/2023, 12:37 PM  Cc: Storm Frisk, MD

## 2023-05-12 ENCOUNTER — Ambulatory Visit
Admission: RE | Admit: 2023-05-12 | Discharge: 2023-05-12 | Disposition: A | Discharge status: Home / Self Care | Payer: 59 | Source: Ambulatory Visit | Attending: Adult Health | Admitting: Adult Health

## 2023-05-12 DIAGNOSIS — R972 Elevated prostate specific antigen [PSA]: Secondary | ICD-10-CM

## 2023-05-12 MED ORDER — GADOPICLENOL 0.5 MMOL/ML IV SOLN
7.5000 mL | Freq: Once | INTRAVENOUS | Status: AC | PRN
  Administered 2023-05-12: 7.5 mL via INTRAVENOUS

## 2023-05-17 NOTE — Progress Notes (Signed)
____________________________________________________________  Attending physician addendum:  Thank you for sending this case to me. I have reviewed the entire note and agree with the plan.   Malyssa Maris Danis, MD  ____________________________________________________________  

## 2023-06-15 ENCOUNTER — Ambulatory Visit: Payer: 59 | Admitting: Family Medicine

## 2023-06-17 ENCOUNTER — Telehealth: Payer: Self-pay | Admitting: *Deleted

## 2023-06-17 NOTE — Telephone Encounter (Signed)
Left message to call back to schedule tele pre op appt.  

## 2023-06-17 NOTE — Telephone Encounter (Signed)
   Name: [name]  DOB: 07-02-1960  MRN: [id number]  Primary Cardiologist: Christell Constant, MD   Preoperative team, please contact this patient and set up a phone call appointment for further preoperative risk assessment. Please obtain consent and complete medication review. Thank you for your help.  I confirm that guidance regarding antiplatelet and oral anticoagulation therapy has been completed and, if necessary, noted below.  Per office protocol, if patient is without any new symptoms or concerns at the time of their virtual visit, he may hold Plavix for 5 days prior to procedure. Please resume Plavix as soon as possible postprocedure, at the discretion of the surgeon.    I also confirmed the patient resides in the state of [place]. As per [facility] Medical Board telemedicine laws, the patient must reside in the state in which the provider is licensed.   Joylene Grapes, NP 06/17/2023, 12:23 PM [facility]

## 2023-06-17 NOTE — Telephone Encounter (Signed)
   Pre-operative Risk Assessment    Patient Name: [name]  DOB: 09/29/1960 MRN: [id number]   Date of last office visit: 01/18/23 DR. CHANDRASEKHAR Date of next office visit: NONE   Request for Surgical Clearance    Procedure:  PROSTATE FUSION Bx   Date of Surgery:  Clearance TBD                                Surgeon:  DR. MARYELLEN PACE Surgeon's Group or Practice Name:  [facility] Phone number:  [phone] Fax number:  [phone]   Type of Clearance Requested:   - Medical  - Pharmacy:  Hold Clopidogrel (Plavix) x 5 DAYS PRIOR   Type of Anesthesia:  Not Indicated   Additional requests/questions:    Elpidio Anis   06/17/2023, 11:31 AM

## 2023-06-20 ENCOUNTER — Telehealth: Payer: Self-pay

## 2023-06-20 NOTE — Telephone Encounter (Signed)
Called patient to set up an Tele appt on 02/13 for a pre-op, meds rec and consent done

## 2023-06-20 NOTE — Telephone Encounter (Signed)
Called patient to set up an Tele appt on 02/13 for a pre-op, meds rec and consent done     Patient Consent for Virtual Visit        [name] has provided verbal consent on 06/20/2023 for a virtual visit (video or telephone).   CONSENT FOR VIRTUAL VISIT FOR:  [name]  By participating in this virtual visit I agree to the following:  I hereby voluntarily request, consent and authorize [facility] and its employed or contracted physicians, physician assistants, nurse practitioners or other licensed health care professionals (the Practitioner), to provide me with telemedicine health care services (the "Services") as deemed necessary by the treating Practitioner. I acknowledge and consent to receive the Services by the Practitioner via telemedicine. I understand that the telemedicine visit will involve communicating with the Practitioner through live audiovisual communication technology and the disclosure of certain medical information by electronic transmission. I acknowledge that I have been given the opportunity to request an in-person assessment or other available alternative prior to the telemedicine visit and am voluntarily participating in the telemedicine visit.  I understand that I have the right to withhold or withdraw my consent to the use of telemedicine in the course of my care at any time, without affecting my right to future care or treatment, and that the Practitioner or I may terminate the telemedicine visit at any time. I understand that I have the right to inspect all information obtained and/or recorded in the course of the telemedicine visit and may receive copies of available information for a reasonable fee.  I understand that some of the potential risks of receiving the Services via telemedicine include:  Delay or interruption in medical evaluation due to technological equipment failure or disruption; Information transmitted may not be sufficient (e.g.  poor resolution of images) to allow for appropriate medical decision making by the Practitioner; and/or  In rare instances, security protocols could fail, causing a breach of personal health information.  Furthermore, I acknowledge that it is my responsibility to provide information about my medical history, conditions and care that is complete and accurate to the best of my ability. I acknowledge that Practitioner's advice, recommendations, and/or decision may be based on factors not within their control, such as incomplete or inaccurate data provided by me or distortions of diagnostic images or specimens that may result from electronic transmissions. I understand that the practice of medicine is not an exact science and that Practitioner makes no warranties or guarantees regarding treatment outcomes. I acknowledge that a copy of this consent can be made available to me via my patient portal [facility] MyChart), or I can request a printed copy by calling the office of [facility].    I understand that my insurance will be billed for this visit.   I have read or had this consent read to me. I understand the contents of this consent, which adequately explains the benefits and risks of the Services being provided via telemedicine.  I have been provided ample opportunity to ask questions regarding this consent and the Services and have had my questions answered to my satisfaction. I give my informed consent for the services to be provided through the use of telemedicine in my medical care

## 2023-07-14 ENCOUNTER — Ambulatory Visit: Payer: 59 | Attending: Emergency Medicine | Admitting: Emergency Medicine

## 2023-07-14 DIAGNOSIS — Z01810 Encounter for preprocedural cardiovascular examination: Secondary | ICD-10-CM

## 2023-07-14 NOTE — Progress Notes (Signed)
Virtual Visit via Telephone Note   Because of [name]'s co-morbid illnesses, he is at least at moderate risk for complications without adequate follow up.  This format is felt to be most appropriate for this patient at this time.  The patient did not have access to video technology/had technical difficulties with video requiring transitioning to audio format only (telephone).  All issues noted in this document were discussed and addressed.  No physical exam could be performed with this format.  Please refer to the patient's chart for his consent to telehealth for [facility].  Evaluation Performed:  Preoperative cardiovascular risk assessment _____________   Date:  07/14/2023   Patient ID:  [name], DOB Apr 25, 1961, MRN [id number] Patient Location:  Home Provider location:   Office  Primary Care Provider:  Hoy Register, MD Primary Cardiologist:  Christell Constant, MD  Chief Complaint / Patient Profile   63 y.o. y/o male with a h/o coronary artery disease, apical variant hypertrophic cardiomyopathy, hyperlipidemia, hypertension, tobacco abuse, prior CVA, migraine, head who is pending prostate [facility] date of surgery TBD and presents today for telephonic preoperative cardiovascular risk assessment.  History of Present Illness    [name] is a 63 y.o. male who presents via audio/video conferencing for a telehealth visit today.  Pt was last seen in cardiology clinic on 01/18/2023 by Dr. Izora Ribas.  At that time Nithin Demeo was doing well .  The patient is now pending procedure as outlined above. Since his last visit, he denies chest pain, shortness of breath, lower extremity edema, fatigue, palpitations, melena, hematuria, hemoptysis, diaphoresis, weakness, presyncope, syncope, orthopnea, and PND.  Past Medical History    Past Medical History:  Diagnosis Date   AC (acromioclavicular) arthritis 11/23/2018    Injected November 23, 2018   Anemia    Apical variant hypertrophic cardiomyopathy (HCC)    CAD (coronary artery disease)    a. LHC in 2011 with severe dz in D1/D2 and very distal LAD--> too small for PCI and managed medically.  b.  LHC in 10/2013 after an abnormal nuclear study w/ non-obst dz/c. 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   HOH (hard of hearing)    rgiht ear   Hyperlipidemia    Hypertension    Migraine headache    Stroke [facility])    Past Surgical History:  Procedure Laterality Date   CARDIAC CATHETERIZATION N/A 02/24/2016   Procedure: Left Heart Cath and Coronary Angiography;  Surgeon: Tonny Bollman, MD;  Location[facility] INVASIVE CV LAB;  Service: Cardiovascular;  Laterality: N/A;   CARDIAC CATHETERIZATION N/A 02/24/2016   Procedure: Coronary Stent Intervention;  Surgeon: Tonny Bollman, MD;  Location: [facility] INVASIVE CV LAB;  Service: Cardiovascular;  Laterality: N/A;   CORONARY ANGIOPLASTY WITH STENT PLACEMENT  2017   CORONARY BALLOON ANGIOPLASTY N/A 04/30/2019   Procedure: CORONARY BALLOON ANGIOPLASTY;  Surgeon: Swaziland, Peter M, MD;  Location[facility] INVASIVE CV LAB;  Service: Cardiovascular;  Laterality: N/A;   INGUINAL HERNIA REPAIR Left    LEFT HEART CATH AND CORONARY ANGIOGRAPHY N/A 04/30/2019   Procedure: LEFT HEART CATH  AND CORONARY ANGIOGRAPHY;  Surgeon: Dolores Patty, MD;  Location: [facility] INVASIVE CV LAB;  Service: Cardiovascular;  Laterality: N/A;   LEFT HEART CATH AND CORONARY ANGIOGRAPHY N/A 05/14/2019   Procedure: LEFT HEART CATH AND CORONARY ANGIOGRAPHY;  Surgeon: Swaziland, Peter M, MD;  Location: [facility] INVASIVE CV LAB;  Service: Cardiovascular;   Laterality: N/A;   LEFT HEART CATHETERIZATION WITH CORONARY ANGIOGRAM N/A 11/22/2013   Procedure: LEFT HEART CATHETERIZATION WITH CORONARY ANGIOGRAM;  Surgeon: [name], MD;  Location: [facility] CATH LAB;  Service: Cardiovascular;  Laterality: N/A;   TEE WITHOUT CARDIOVERSION N/A 08/22/2015   Procedure: TRANSESOPHAGEAL ECHOCARDIOGRAM (TEE);  Surgeon: Vesta Mixer, MD;  Location: [facility] ENDOSCOPY;  Service: Cardiovascular;  Laterality: N/A;    Allergies  Allergies  Allergen Reactions   Lisinopril Swelling    Facial swelling    Home Medications    Prior to Admission medications   Medication Sig Start Date End Date Taking? Authorizing Provider  amLODipine (NORVASC) 10 MG tablet TAKE 1 TABLET BY MOUTH EVERY DAY 02/21/23   Storm Frisk, MD  buPROPion (WELLBUTRIN XL) 150 MG 24 hr tablet Take 1 tablet (150 mg total) by mouth daily. 03/09/23   Hoy Register, MD  Evolocumab (REPATHA SURECLICK) 140 MG/ML SOAJ Inject 140 mg into the skin every 14 (fourteen) days. 10/08/22   Chandrasekhar, Rondel Jumbo, MD  gabapentin (NEURONTIN) 300 MG capsule Take 1 capsule (300 mg total) by mouth 2 (two) times daily. 10/20/22 06/20/23  Storm Frisk, MD  losartan-hydrochlorothiazide (HYZAAR) 100-25 MG tablet TAKE 1 TABLET BY MOUTH DAILY. 06/23/22   Anders Simmonds, PA-C  naproxen (NAPROSYN) 500 MG tablet Take by mouth as needed for moderate pain. Patient not taking: Reported on 06/20/2023 10/15/15   [provider]  nitroGLYCERIN (NITROSTAT) 0.4 MG SL tablet Place 1 tablet (0.4 mg total) under the tongue every 5 (five) minutes as needed for chest pain. 08/25/22   Chandrasekhar, Lafayette Dragon A, MD  potassium chloride SA (KLOR-CON M) 20 MEQ tablet Take 2 tablets (40 mEq total) by mouth daily. X 7 days 01/26/23   Sherrilyn Rist, MD  ranolazine (RANEXA) 500 MG 12 hr tablet TAKE 1 TABLET (500 MG TOTAL) BY MOUTH 2 (TWO) TIMES DAILY. 06/23/22 06/23/23  Anders Simmonds, PA-C  rosuvastatin (CRESTOR) 40 MG tablet  TAKE 1 TABLET (40 MG TOTAL) BY MOUTH DAILY. 06/23/22 06/23/23  Anders Simmonds, PA-C  tamsulosin (FLOMAX) 0.4 MG CAPS capsule Take 0.4 mg by mouth at bedtime. 06/11/23   [provider]    Physical Exam    Vital Signs:  Finlay Godbee does not have vital signs available for review today.  Given telephonic nature of communication, physical exam is limited. AAOx3. NAD. Normal affect.  Speech and respirations are unlabored.  Accessory Clinical Findings    None  Assessment & Plan    1.  Preoperative Cardiovascular Risk Assessment: According to the Revised Cardiac Risk Index (RCRI), his Perioperative Risk of Major Cardiac Event is (%): 0.9. His Functional Capacity in METs is: 9.89 according to the Duke Activity Status Index (DASI). Therefore, based on ACC/AHA guidelines, patient would be at acceptable risk for the planned procedure without further cardiovascular testing. I will route this recommendation to the requesting party via Epic fax function.   The patient was advised that if he develops new symptoms prior to surgery to contact our office to arrange for a follow-up visit, and he verbalized understanding.  He may hold Plavix for 5 days  prior to procedure. Please resume Plavix as soon as possible postprocedure, at the discretion of the surgeon.   A copy of this note will be routed to requesting surgeon.  Time:   Today, I have spent 6 minutes with the patient with telehealth technology discussing medical history, symptoms, and management plan.     Denyce Robert, NP  07/14/2023, 7:33 AM

## 2023-07-21 ENCOUNTER — Other Ambulatory Visit: Payer: Self-pay | Admitting: Physician Assistant

## 2023-07-21 DIAGNOSIS — E785 Hyperlipidemia, unspecified: Secondary | ICD-10-CM

## 2023-07-21 DIAGNOSIS — I25119 Atherosclerotic heart disease of native coronary artery with unspecified angina pectoris: Secondary | ICD-10-CM

## 2023-07-21 DIAGNOSIS — I10 Essential (primary) hypertension: Secondary | ICD-10-CM

## 2023-07-21 NOTE — Telephone Encounter (Signed)
Requested Prescriptions  Pending Prescriptions Disp Refills   losartan-hydrochlorothiazide (HYZAAR) 100-25 MG tablet [Pharmacy Med Name: LOSARTAN-HCTZ 100-25 MG TAB] 90 tablet 0    Sig: TAKE 1 TABLET BY MOUTH EVERY DAY     Cardiovascular: ARB + Diuretic Combos Failed - 07/21/2023  1:41 PM      Failed - Cr in normal range and within 180 days    Creat  Date Value Ref Range Status  03/09/2016 1.33 0.70 - 1.33 mg/dL Final    Comment:      For patients > or = 62 years of age: The upper reference limit for Creatinine is approximately 13% higher for people identified as African-American.      Creatinine, Ser  Date Value Ref Range Status  03/21/2023 1.35 (H) 0.61 - 1.24 mg/dL Final         Passed - K in normal range and within 180 days    Potassium  Date Value Ref Range Status  03/21/2023 3.5 3.5 - 5.1 mmol/L Final         Passed - Na in normal range and within 180 days    Sodium  Date Value Ref Range Status  03/21/2023 136 135 - 145 mmol/L Final  06/23/2022 140 134 - 144 mmol/L Final         Passed - eGFR is 10 or above and within 180 days    GFR calc Af Amer  Date Value Ref Range Status  05/07/2020 69 >59 mL/min/1.73 Final    Comment:    **In accordance with recommendations from the NKF-ASN Task force,**   [facility] is in the process of updating its eGFR calculation to the   2021 CKD-EPI creatinine equation that estimates kidney function   without a race variable.    GFR, Estimated  Date Value Ref Range Status  03/21/2023 60 (L) >60 mL/min Final    Comment:    (NOTE) Calculated using the CKD-EPI Creatinine Equation (2021)    GFR  Date Value Ref Range Status  02/08/2023 55.24 (L) >60.00 mL/min Final    Comment:    Calculated using the CKD-EPI Creatinine Equation (2021)   eGFR  Date Value Ref Range Status  06/23/2022 50 (L) >59 mL/min/1.73 Final         Passed - Patient is not pregnant      Passed - Last BP in normal range    BP Readings from Last 1  Encounters:  05/10/23 120/70         Passed - Valid encounter within last 6 months    Recent Outpatient Visits           4 months ago Weight loss   [place] [facility] [place] - A Dept Of [place]. [facility] Hoy Register, MD   9 months ago Essential hypertension   [place] [facility] [place] - A Dept Of [place]. [facility] Storm Frisk, MD   1 year ago Penile discharge   [place] [facility] - A Dept Of [place]. [facility] Hoy Register, MD   1 year ago Poor compliance   [place] [facility] [place] - A Dept Of [place]. [facility], Marzella Schlein, [place]   1 year ago Essential hypertension    [facility] [place] - A Dept Of [place]. [facility] Drucilla Chalet, RPH-CPP       Future Appointments  In 1 month Hoy Register, MD [facility] [place] - A Dept Of [place]. [facility]             rosuvastatin (CRESTOR) 40 MG tablet [Pharmacy Med Name: ROSUVASTATIN CALCIUM 40 MG TAB] 90 tablet 2    Sig: TAKE 1 TABLET BY MOUTH EVERY DAY     Cardiovascular:  Antilipid - Statins 2 Failed - 07/21/2023  1:41 PM      Failed - Cr in normal range and within 360 days    Creat  Date Value Ref Range Status  03/09/2016 1.33 0.70 - 1.33 mg/dL Final    Comment:      For patients > or = 62 years of age: The upper reference limit for Creatinine is approximately 13% higher for people identified as African-American.      Creatinine, Ser  Date Value Ref Range Status  03/21/2023 1.35 (H) 0.61 - 1.24 mg/dL Final         Failed - Lipid Panel in normal range within the last 12 months    Cholesterol, Total  Date Value Ref Range Status  06/23/2022 102 100 - 199 mg/dL Final   LDL Chol Calc (NIH)  Date Value Ref Range Status  06/23/2022 32 0 - 99 mg/dL Final   HDL  Date Value Ref Range Status  06/23/2022 60 >39 mg/dL Final   Triglycerides  Date  Value Ref Range Status  06/23/2022 36 0 - 149 mg/dL Final         Passed - Patient is not pregnant      Passed - Valid encounter within last 12 months    Recent Outpatient Visits           4 months ago Weight loss   [place] [facility] [place] - A Dept Of [place]. [facility] Hoy Register, MD   9 months ago Essential hypertension   [place] [facility] [place] - A Dept Of [place]. [facility] Storm Frisk, MD   1 year ago Penile discharge   [place] [facility] - A Dept Of [place]. [facility] Hoy Register, MD   1 year ago Poor compliance   [place] [facility] [place] - A Dept Of [place]. [facility], Marzella Schlein, [place]   1 year ago Essential hypertension    [facility] [place] - A Dept Of [place]. [facility] Lois Huxley, Cornelius Moras, RPH-CPP       Future Appointments             In 1 month Hoy Register, MD [facility] [place] - A Dept Of [place]. [facility]

## 2023-07-21 NOTE — Telephone Encounter (Signed)
Requested medication (s) are due for refill today - yes  Requested medication (s) are on the active medication list -yes  Future visit scheduled -yes  Last refill: 06/23/22 #90 2RF  Notes to clinic: fails lab protocol- over 1 year-06/23/22  Requested Prescriptions  Pending Prescriptions Disp Refills   rosuvastatin (CRESTOR) 40 MG tablet [Pharmacy Med Name: ROSUVASTATIN CALCIUM 40 MG TAB] 90 tablet 2    Sig: TAKE 1 TABLET BY MOUTH EVERY DAY     Cardiovascular:  Antilipid - Statins 2 Failed - 07/21/2023  1:42 PM      Failed - Cr in normal range and within 360 days    Creat  Date Value Ref Range Status  03/09/2016 1.33 0.70 - 1.33 mg/dL Final    Comment:      For patients > or = 62 years of age: The upper reference limit for Creatinine is approximately 13% higher for people identified as African-American.      Creatinine, Ser  Date Value Ref Range Status  03/21/2023 1.35 (H) 0.61 - 1.24 mg/dL Final         Failed - Lipid Panel in normal range within the last 12 months    Cholesterol, Total  Date Value Ref Range Status  06/23/2022 102 100 - 199 mg/dL Final   LDL Chol Calc (NIH)  Date Value Ref Range Status  06/23/2022 32 0 - 99 mg/dL Final   HDL  Date Value Ref Range Status  06/23/2022 60 >39 mg/dL Final   Triglycerides  Date Value Ref Range Status  06/23/2022 36 0 - 149 mg/dL Final         Passed - Patient is not pregnant      Passed - Valid encounter within last 12 months    Recent Outpatient Visits           4 months ago Weight loss   [place] [facility] [place] - A Dept Of [place]. [facility] Hoy Register, MD   9 months ago Essential hypertension   [place] [facility] [place] - A Dept Of [place]. [facility] Storm Frisk, MD   1 year ago Penile discharge   [place] [facility] - A Dept Of [place]. [facility] Hoy Register, MD   1 year ago Poor compliance   [place] [facility] [place] - A  Dept Of [place]. [facility] [place], Marzella Schlein, [place]   1 year ago Essential hypertension   [place] [facility] [place] - A Dept Of [place]. [facility] Lois Huxley, Cornelius Moras, RPH-CPP       Future Appointments             In 1 month Hoy Register, MD [facility] [place] - A Dept Of . [facility]            Signed Prescriptions Disp Refills   losartan-hydrochlorothiazide (HYZAAR) 100-25 MG tablet 90 tablet 0    Sig: TAKE 1 TABLET BY MOUTH EVERY DAY     Cardiovascular: ARB + Diuretic Combos Failed - 07/21/2023  1:42 PM      Failed - Cr in normal range and within 180 days    Creat  Date Value Ref Range Status  03/09/2016 1.33 0.70 - 1.33 mg/dL Final    Comment:      For patients > or = 62 years of age: The upper reference limit for Creatinine is approximately  13% higher for people identified as African-American.      Creatinine, Ser  Date Value Ref Range Status  03/21/2023 1.35 (H) 0.61 - 1.24 mg/dL Final         Passed - K in normal range and within 180 days    Potassium  Date Value Ref Range Status  03/21/2023 3.5 3.5 - 5.1 mmol/L Final         Passed - Na in normal range and within 180 days    Sodium  Date Value Ref Range Status  03/21/2023 136 135 - 145 mmol/L Final  06/23/2022 140 134 - 144 mmol/L Final         Passed - eGFR is 10 or above and within 180 days    GFR calc Af Amer  Date Value Ref Range Status  05/07/2020 69 >59 mL/min/1.73 Final    Comment:    **In accordance with recommendations from the NKF-ASN Task force,**   [facility] is in the process of updating its eGFR calculation to the   2021 CKD-EPI creatinine equation that estimates kidney function   without a race variable.    GFR, Estimated  Date Value Ref Range Status  03/21/2023 60 (L) >60 mL/min Final    Comment:    (NOTE) Calculated using the CKD-EPI Creatinine Equation (2021)    GFR  Date Value Ref Range Status   02/08/2023 55.24 (L) >60.00 mL/min Final    Comment:    Calculated using the CKD-EPI Creatinine Equation (2021)   eGFR  Date Value Ref Range Status  06/23/2022 50 (L) >59 mL/min/1.73 Final         Passed - Patient is not pregnant      Passed - Last BP in normal range    BP Readings from Last 1 Encounters:  05/10/23 120/70         Passed - Valid encounter within last 6 months    Recent Outpatient Visits           4 months ago Weight loss   [place] [facility] [place] - A Dept Of [place]. [facility] Hoy Register, MD   9 months ago Essential hypertension   [place] [facility] [place] - A Dept Of [place]. [facility] Storm Frisk, MD   1 year ago Penile discharge   [place] [facility] - A Dept Of [place]. [facility] Hoy Register, MD   1 year ago Poor compliance   [place] [facility] [place] - A Dept Of [place]. [facility] [place], Marzella Schlein, [place]   1 year ago Essential hypertension   [place] [facility] [place] - A Dept Of [place]. [facility] Lois Huxley, Cornelius Moras, RPH-CPP       Future Appointments             In 1 month Hoy Register, MD [facility] [place] - A Dept Of [place]. [facility]               Requested Prescriptions  Pending Prescriptions Disp Refills   rosuvastatin (CRESTOR) 40 MG tablet [Pharmacy Med Name: ROSUVASTATIN CALCIUM 40 MG TAB] 90 tablet 2    Sig: TAKE 1 TABLET BY MOUTH EVERY DAY     Cardiovascular:  Antilipid - Statins 2 Failed - 07/21/2023  1:42 PM      Failed - Cr in normal range and within 360 days  Creat  Date Value Ref Range Status  03/09/2016 1.33 0.70 - 1.33 mg/dL Final    Comment:      For patients > or = 62 years of age: The upper reference limit for Creatinine is approximately 13% higher for people identified as African-American.      Creatinine, Ser  Date Value Ref Range Status  03/21/2023 1.35  (H) 0.61 - 1.24 mg/dL Final         Failed - Lipid Panel in normal range within the last 12 months    Cholesterol, Total  Date Value Ref Range Status  06/23/2022 102 100 - 199 mg/dL Final   LDL Chol Calc (NIH)  Date Value Ref Range Status  06/23/2022 32 0 - 99 mg/dL Final   HDL  Date Value Ref Range Status  06/23/2022 60 >39 mg/dL Final   Triglycerides  Date Value Ref Range Status  06/23/2022 36 0 - 149 mg/dL Final         Passed - Patient is not pregnant      Passed - Valid encounter within last 12 months    Recent Outpatient Visits           4 months ago Weight loss   [place] [facility] [place] - A Dept Of [place]. [facility] Hoy Register, MD   9 months ago Essential hypertension   [place] [facility] [place] - A Dept Of [place]. [facility] Storm Frisk, MD   1 year ago Penile discharge   [place] [facility] - A Dept Of [place]. [facility] Hoy Register, MD   1 year ago Poor compliance   [place] [facility] [place] - A Dept Of [place]. [facility] [place], Marzella Schlein, [place]   1 year ago Essential hypertension   [place] [facility] [place] - A Dept Of [place]. [facility] Lois Huxley, Cornelius Moras, RPH-CPP       Future Appointments             In 1 month Hoy Register, MD [facility] [place] - A Dept Of [place]. [facility]            Signed Prescriptions Disp Refills   losartan-hydrochlorothiazide (HYZAAR) 100-25 MG tablet 90 tablet 0    Sig: TAKE 1 TABLET BY MOUTH EVERY DAY     Cardiovascular: ARB + Diuretic Combos Failed - 07/21/2023  1:42 PM      Failed - Cr in normal range and within 180 days    Creat  Date Value Ref Range Status  03/09/2016 1.33 0.70 - 1.33 mg/dL Final    Comment:      For patients > or = 62 years of age: The upper reference limit for Creatinine is approximately 13% higher for people identified  as African-American.      Creatinine, Ser  Date Value Ref Range Status  03/21/2023 1.35 (H) 0.61 - 1.24 mg/dL Final         Passed - K in normal range and within 180 days    Potassium  Date Value Ref Range Status  03/21/2023 3.5 3.5 - 5.1 mmol/L Final         Passed - Na in normal range and within 180 days    Sodium  Date Value Ref Range Status  03/21/2023 136 135 - 145 mmol/L Final  06/23/2022 140 134 - 144 mmol/L Final  Passed - eGFR is 10 or above and within 180 days    GFR calc Af Amer  Date Value Ref Range Status  05/07/2020 69 >59 mL/min/1.73 Final    Comment:    **In accordance with recommendations from the NKF-ASN Task force,**   [facility] is in the process of updating its eGFR calculation to the   2021 CKD-EPI creatinine equation that estimates kidney function   without a race variable.    GFR, Estimated  Date Value Ref Range Status  03/21/2023 60 (L) >60 mL/min Final    Comment:    (NOTE) Calculated using the CKD-EPI Creatinine Equation (2021)    GFR  Date Value Ref Range Status  02/08/2023 55.24 (L) >60.00 mL/min Final    Comment:    Calculated using the CKD-EPI Creatinine Equation (2021)   eGFR  Date Value Ref Range Status  06/23/2022 50 (L) >59 mL/min/1.73 Final         Passed - Patient is not pregnant      Passed - Last BP in normal range    BP Readings from Last 1 Encounters:  05/10/23 120/70         Passed - Valid encounter within last 6 months    Recent Outpatient Visits           4 months ago Weight loss   [place] [facility] [place] - A Dept Of [place]. [facility] Hoy Register, MD   9 months ago Essential hypertension   [place] [facility] [place] - A Dept Of [place]. [facility] Storm Frisk, MD   1 year ago Penile discharge   [place] [facility] - A Dept Of [place]. [facility] Hoy Register, MD   1 year ago Poor compliance   [place] [facility] [place]  - A Dept Of [place]. [facility] [place], Marzella Schlein, [place]   1 year ago Essential hypertension   [place] [facility] [place] - A Dept Of [place]. [facility] Lois Huxley, Cornelius Moras, RPH-CPP       Future Appointments             In 1 month Hoy Register, MD [facility] [place] - A Dept Of . [facility]

## 2023-07-22 ENCOUNTER — Other Ambulatory Visit: Payer: Self-pay | Admitting: Physician Assistant

## 2023-07-22 DIAGNOSIS — I25119 Atherosclerotic heart disease of native coronary artery with unspecified angina pectoris: Secondary | ICD-10-CM

## 2023-07-23 ENCOUNTER — Other Ambulatory Visit: Payer: Self-pay | Admitting: Family Medicine

## 2023-07-25 NOTE — Telephone Encounter (Signed)
 Rx 03/09/23 #90 1RF- too soon Requested Prescriptions  Pending Prescriptions Disp Refills   buPROPion (WELLBUTRIN XL) 150 MG 24 hr tablet [Pharmacy Med Name: BUPROPION HCL XL 150 MG TABLET] 90 tablet 1    Sig: TAKE 1 TABLET BY MOUTH EVERY DAY     Psychiatry: Antidepressants - bupropion Failed - 07/25/2023  2:50 PM      Failed - Cr in normal range and within 360 days    Creat  Date Value Ref Range Status  03/09/2016 1.33 0.70 - 1.33 mg/dL Final    Comment:      For patients > or = 62 years of age: The upper reference limit for Creatinine is approximately 13% higher for people identified as African-American.      Creatinine, Ser  Date Value Ref Range Status  03/21/2023 1.35 (H) 0.61 - 1.24 mg/dL Final         Passed - AST in normal range and within 360 days    AST  Date Value Ref Range Status  01/21/2023 26 0 - 37 U/L Final         Passed - ALT in normal range and within 360 days    ALT  Date Value Ref Range Status  01/21/2023 15 0 - 53 U/L Final         Passed - Completed PHQ-2 or PHQ-9 in the last 360 days      Passed - Last BP in normal range    BP Readings from Last 1 Encounters:  05/10/23 120/70         Passed - Valid encounter within last 6 months    Recent Outpatient Visits           4 months ago Weight loss   [facility] - [facility] Dept Of [place]. [facility] Hoy Register, MD   9 months ago Essential hypertension   [facility] - [facility] Dept Of [place]. [facility] Storm Frisk, MD   1 year ago Penile discharge   [facility] - A Dept Of [place]. [facility] Hoy Register, MD   1 year ago Poor compliance   [facility] A Dept Of [place]. [facility], Marzella Schlein, [place]   1 year ago Essential hypertension   [facility] - A Dept Of . [facility] Lois Huxley, Cornelius Moras, RPH-CPP       Future  Appointments             In 4 weeks Hoy Register, MD [facility] - A Dept Of [place]igha Bridegroom. [facility]

## 2023-08-06 ENCOUNTER — Other Ambulatory Visit: Payer: Self-pay | Admitting: Physician Assistant

## 2023-08-06 DIAGNOSIS — I25119 Atherosclerotic heart disease of native coronary artery with unspecified angina pectoris: Secondary | ICD-10-CM

## 2023-08-15 ENCOUNTER — Other Ambulatory Visit: Payer: Self-pay | Admitting: Critical Care Medicine

## 2023-08-15 DIAGNOSIS — I10 Essential (primary) hypertension: Secondary | ICD-10-CM

## 2023-08-23 ENCOUNTER — Ambulatory Visit: Payer: 59 | Attending: Family Medicine | Admitting: Family Medicine

## 2023-08-23 ENCOUNTER — Encounter: Payer: Self-pay | Admitting: Family Medicine

## 2023-08-23 VITALS — BP 137/76 | HR 66 | Ht 68.0 in | Wt 174.0 lb

## 2023-08-23 DIAGNOSIS — E785 Hyperlipidemia, unspecified: Secondary | ICD-10-CM

## 2023-08-23 DIAGNOSIS — C61 Malignant neoplasm of prostate: Secondary | ICD-10-CM

## 2023-08-23 DIAGNOSIS — I10 Essential (primary) hypertension: Secondary | ICD-10-CM

## 2023-08-23 DIAGNOSIS — I25119 Atherosclerotic heart disease of native coronary artery with unspecified angina pectoris: Secondary | ICD-10-CM | POA: Diagnosis not present

## 2023-08-23 MED ORDER — ROSUVASTATIN CALCIUM 40 MG PO TABS: 40.0000 mg | ORAL_TABLET | Freq: Every day | ORAL | 1 refills | Status: AC

## 2023-08-23 MED ORDER — CLOPIDOGREL BISULFATE 75 MG PO TABS: 75.0000 mg | ORAL_TABLET | Freq: Every day | ORAL | 1 refills | Status: DC

## 2023-08-23 MED ORDER — RANOLAZINE ER 500 MG PO TB12: 500.0000 mg | ORAL_TABLET | Freq: Two times a day (BID) | ORAL | 1 refills | Status: AC

## 2023-08-23 MED ORDER — AMLODIPINE BESYLATE 10 MG PO TABS: 10.0000 mg | ORAL_TABLET | Freq: Every day | ORAL | 1 refills | Status: DC

## 2023-08-23 MED ORDER — LOSARTAN POTASSIUM-HCTZ 100-25 MG PO TABS: 1.0000 | ORAL_TABLET | Freq: Every day | ORAL | 0 refills | Status: DC

## 2023-08-23 MED ORDER — BUPROPION HCL ER (XL) 150 MG PO TB24: 150.0000 mg | ORAL_TABLET | Freq: Every day | ORAL | 1 refills | Status: DC

## 2023-08-23 NOTE — Patient Instructions (Signed)
 VISIT SUMMARY:  During today's visit, we discussed your recent diagnosis of prostate cancer, your concerns about the prognosis and surgery, and your current health status, including your history of coronary artery disease, depression, and smoking. We reviewed your medications and planned for your upcoming urologist consultation.  YOUR PLAN:  -PROSTATE CANCER: Prostate cancer is a type of cancer that occurs in the prostate gland. Early detection often leads to better outcomes. Your PSA level is slightly elevated at 4.7. We recommend preparing questions for your urologist and discussing the stage of your cancer and survival rates during your consultation on April 4th. Early detection is a positive sign, and we are hopeful for a good prognosis.  -DEPRESSION: Depression is a mood disorder that causes persistent feelings of sadness and loss of interest. Your Wellbutrin prescription has been effective in managing your depression and aiding in smoking cessation. We will refill your prescription, and if you feel the need for additional support, we can refer you to a therapist.  -CORONARY ARTERY DISEASE: Coronary artery disease is a condition where the blood vessels supplying the heart are narrowed or blocked. Your condition is well-managed with your current medications (Ranexa, Crestor, Plavix, and Repatha). Please continue taking these medications as prescribed.  -HYPERTENSION: Hypertension, or high blood pressure, is a condition where the force of the blood against the artery walls is too high. Your blood pressure is well-controlled with your current medication regimen. Please continue taking your antihypertensive medication as prescribed.  -GENERAL HEALTH MAINTENANCE: You have made significant progress in reducing your smoking, which is beneficial for your overall health and reduces cancer risk. We encourage you to quit smoking entirely. We will refill your Wellbutrin prescription to support this  effort.  INSTRUCTIONS:  Please attend your scheduled urologist consultation on April 4th to discuss your prostate cancer in detail. We will plan a follow-up appointment six months post-surgery. Additionally, we will order blood tests to check your potassium levels.

## 2023-08-23 NOTE — Progress Notes (Signed)
 Subjective:  Patient ID: [name], male    DOB: 26-Aug-1960  Age: 63 y.o. MRN: [id number]  CC: Medical Management of Chronic Issues (Discuss upcoming surgery)     Discussed the use of AI scribe software for clinical note transcription with the patient, who gave verbal consent to proceed.  History of Present Illness The patient presents with a history of hypertrophic cardiomyopathy, CAD (currently on Ranexa), hypertension, nicotine dependence (35 pack years ).he informs me of a recent diagnosis of prostate cancer, expressing concern about the prognosis and the necessity of surgery due to a family history of prostate cancer recurrence and subsequent mortality post-surgery. He reports a recent biopsy performed by his urologist, which confirmed the presence of prostate cancer. The patient was unable to recall the specific Gleason score but acknowledged that the cancer was present. He also reports a history of elevated PSA levels, which initially prompted the biopsy.  The patient also has a history of coronary artery disease, for which he is on Ranexa, Crestor, Plavix, and Repatha. He reports regular medication adherence. He also has a history of depression and smoking, for which he was prescribed Wellbutrin. He reports a significant reduction in smoking since starting Wellbutrin, down to three or four cigarettes a day.      Past Medical History:  Diagnosis Date   AC (acromioclavicular) arthritis 11/23/2018   Injected November 23, 2018   Anemia    Apical variant hypertrophic cardiomyopathy (HCC)    CAD (coronary artery disease)    a. LHC in 2011 with severe dz in D1/D2 and very distal LAD--> too small for PCI and managed medically.  b.  LHC in 10/2013 after an abnormal nuclear study w/ non-obst dz/c. 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   HOH (hard of hearing)    rgiht ear   Hyperlipidemia    Hypertension    Migraine headache    Stroke [facility])     Past Surgical History:  Procedure Laterality Date   CARDIAC CATHETERIZATION N/A 02/24/2016   Procedure: Left Heart Cath and Coronary Angiography;  Surgeon: Tonny Bollman, MD;  Location: [facility] INVASIVE CV LAB;  Service: Cardiovascular;  Laterality: N/A;   CARDIAC CATHETERIZATION N/A 02/24/2016   Procedure: Coronary Stent Intervention;  Surgeon: Tonny Bollman, MD;  Location: [facility] INVASIVE CV LAB;  Service: Cardiovascular;  Laterality: N/A;   CORONARY ANGIOPLASTY WITH STENT PLACEMENT  2017   CORONARY BALLOON ANGIOPLASTY N/A 04/30/2019   Procedure: CORONARY BALLOON ANGIOPLASTY;  Surgeon: Swaziland, Peter M, MD;  Location: [facility] INVASIVE CV LAB;  Service: Cardiovascular;  Laterality: N/A;   INGUINAL HERNIA REPAIR Left    LEFT HEART CATH AND CORONARY ANGIOGRAPHY N/A 04/30/2019   Procedure: LEFT HEART CATH AND CORONARY ANGIOGRAPHY;  Surgeon: Dolores Patty, MD;  Location: [facility] INVASIVE CV LAB;  Service: Cardiovascular;  Laterality: N/A;   LEFT HEART CATH AND CORONARY ANGIOGRAPHY N/A 05/14/2019   Procedure: LEFT HEART CATH AND CORONARY ANGIOGRAPHY;  Surgeon: Swaziland, Peter M, MD;  Location: [facility] INVASIVE CV LAB;  Service: Cardiovascular;  Laterality: N/A;   LEFT HEART CATHETERIZATION WITH CORONARY ANGIOGRAM N/A 11/22/2013  Procedure: LEFT HEART CATHETERIZATION WITH CORONARY ANGIOGRAM;  Surgeon: [name], MD;  Location: [facility] CATH LAB;  Service: Cardiovascular;  Laterality: N/A;   TEE WITHOUT CARDIOVERSION N/A 08/22/2015   Procedure: TRANSESOPHAGEAL ECHOCARDIOGRAM (TEE);  Surgeon: Vesta Mixer, MD;  Location: [facility] ENDOSCOPY;  Service: Cardiovascular;  Laterality: N/A;    Family History  Problem Relation Age  of Onset   Hypertension Mother    Heart failure Mother    Hypertension Father    Colon cancer Father 49   Hypertension Brother    Heart disease Son    Heart disease Son    Colon cancer Paternal Uncle     Social History   Socioeconomic History   Marital status: Married    Spouse name: Not on file   Number of children: 3   Years of education: Not on file   Highest education level: Not on file  Occupational History   Occupation: Museum/gallery conservator  Tobacco Use   Smoking status: Some Days    Current packs/day: 1.50    Types: Cigarettes   Smokeless tobacco: Never  Vaping Use   Vaping status: Never Used  Substance and Sexual Activity   Alcohol use: No    Alcohol/week: 0.0 standard drinks of alcohol   Drug use: No   Sexual activity: Not Currently  Other Topics Concern   Not on file  Social History Narrative   18 GRANDKIDS!!!   Social Drivers of Health   Financial Resource Strain: Medium Risk (03/09/2023)   Overall Financial Resource Strain (CARDIA)    Difficulty of Paying Living Expenses: Somewhat hard  Food Insecurity: Food Insecurity Present (03/09/2023)   Hunger Vital Sign    Worried About Running Out of Food in the Last Year: Often true    Ran Out of Food in the Last Year: Often true  Transportation Needs: No Transportation Needs (03/09/2023)   PRAPARE - Administrator, Civil Service (Medical): No    Lack of Transportation (Non-Medical): No  Physical Activity: Inactive (03/09/2023)   Exercise Vital Sign    Days of Exercise per Week: 0 days    Minutes of Exercise per Session: 0 min  Stress: Stress Concern Present (03/09/2023)   [employer] of Occupational Health - Occupational Stress Questionnaire    Feeling of Stress : To some extent  Social Connections: Moderately Isolated (03/09/2023)   Social Connection and Isolation Panel [NHANES]    Frequency of Communication with Friends and Family: Once a week    Frequency of Social Gatherings with  Friends and Family: Never    Attends Religious Services: Never    Database administrator or Organizations: Yes    Attends Engineer, structural: 1 to 4 times per year    Marital Status: Married    Allergies  Allergen Reactions   Lisinopril Swelling    Facial swelling    Outpatient Medications Prior to Visit  Medication Sig Dispense Refill   Evolocumab (REPATHA SURECLICK) 140 MG/ML SOAJ Inject 140 mg into the skin every 14 (fourteen) days. 6 mL 3   naproxen (NAPROSYN) 500 MG tablet Take by mouth as needed for moderate pain (pain score 4-6).     nitroGLYCERIN (NITROSTAT) 0.4 MG SL tablet Place 1 tablet (0.4 mg total) under the tongue every 5 (five) minutes as needed for chest pain. 25 tablet 3   potassium chloride SA (KLOR-CON M) 20 MEQ tablet Take 2 tablets (40 mEq total) by mouth daily. X 7 days  14 tablet 0   tamsulosin (FLOMAX) 0.4 MG CAPS capsule Take 0.4 mg by mouth at bedtime.     amLODipine (NORVASC) 10 MG tablet TAKE 1 TABLET BY MOUTH EVERY DAY 30 tablet 0   clopidogrel (PLAVIX) 75 MG tablet TAKE 1 TABLET BY MOUTH EVERY DAY 30 tablet 0   losartan-hydrochlorothiazide (HYZAAR) 100-25 MG tablet TAKE 1 TABLET BY MOUTH EVERY DAY 90 tablet 0   ranolazine (RANEXA) 500 MG 12 hr tablet TAKE 1 TABLET BY MOUTH TWICE A DAY 60 tablet 0   rosuvastatin (CRESTOR) 40 MG tablet TAKE 1 TABLET BY MOUTH EVERY DAY 90 tablet 2   gabapentin (NEURONTIN) 300 MG capsule Take 1 capsule (300 mg total) by mouth 2 (two) times daily. 60 capsule 1   buPROPion (WELLBUTRIN XL) 150 MG 24 hr tablet Take 1 tablet (150 mg total) by mouth daily. (Patient not taking: Reported on 08/23/2023) 90 tablet 1   No facility-administered medications prior to visit.     ROS Review of Systems  Constitutional:  Negative for activity change and appetite change.  HENT:  Negative for sinus pressure and sore throat.   Respiratory:  Negative for chest tightness, shortness of breath and wheezing.   Cardiovascular:  Negative  for chest pain and palpitations.  Gastrointestinal:  Negative for abdominal distention, abdominal pain and constipation.  Genitourinary: Negative.   Musculoskeletal: Negative.   Psychiatric/Behavioral:  Positive for dysphoric mood. Negative for behavioral problems.     Objective:  BP 137/76   Pulse 66   Ht 5\' 8"  (1.727 m)   Wt 174 lb (78.9 kg)   SpO2 99%   BMI 26.46 kg/m      08/23/2023    2:18 PM 05/10/2023    1:53 PM 03/21/2023    7:00 PM  BP/Weight  Systolic BP 137 120 136  Diastolic BP 76 70 81  Wt. (Lbs) 174 166   BMI 26.46 kg/m2 25.24 kg/m2     Wt Readings from Last 3 Encounters:  08/23/23 174 lb (78.9 kg)  05/10/23 166 lb (75.3 kg)  03/21/23 159 lb (72.1 kg)     Physical Exam Constitutional:      Appearance: He is well-developed.  Cardiovascular:     Rate and Rhythm: Normal rate.     Heart sounds: Normal heart sounds. No murmur heard. Pulmonary:     Effort: Pulmonary effort is normal.     Breath sounds: Normal breath sounds. No wheezing or rales.  Chest:     Chest wall: No tenderness.  Abdominal:     General: Bowel sounds are normal. There is no distension.     Palpations: Abdomen is soft. There is no mass.     Tenderness: There is no abdominal tenderness.  Musculoskeletal:        General: Normal range of motion.     Right lower leg: No edema.     Left lower leg: No edema.  Neurological:     Mental Status: He is alert and oriented to person, place, and time.  Psychiatric:     Comments: Dysphoric mood        Latest Ref Rng & Units 03/21/2023   12:32 PM 02/08/2023    3:14 PM 01/21/2023    2:51 PM  CMP  Glucose 70 - 99 mg/dL 98  93  68   BUN 8 - 23 mg/dL 14  17  14    Creatinine 0.61 - 1.24 mg/dL 2.35  5.73  2.20   Sodium 135 - 145  mmol/L 136  138  141   Potassium 3.5 - 5.1 mmol/L 3.5  3.7  3.3   Chloride 98 - 111 mmol/L 101  101  105   CO2 22 - 32 mmol/L 27  29  29    Calcium 8.9 - 10.3 mg/dL 9.1  9.7  9.1   Total Protein 6.0 - 8.3 g/dL   7.1    Total Bilirubin 0.2 - 1.2 mg/dL   0.7   Alkaline Phos 39 - 117 U/L   62   AST 0 - 37 U/L   26   ALT 0 - 53 U/L   15     Lipid Panel     Component Value Date/Time   CHOL 102 06/23/2022 0902   TRIG 36 06/23/2022 0902   HDL 60 06/23/2022 0902   CHOLHDL 1.7 06/23/2022 0902   CHOLHDL 1.8 05/12/2019 0317   VLDL 7 05/12/2019 0317   LDLCALC 32 06/23/2022 0902    CBC    Component Value Date/Time   WBC 8.3 03/21/2023 1232   RBC 3.64 (L) 03/21/2023 1232   HGB 11.4 (L) 03/21/2023 1232   HGB 12.0 (L) 08/25/2022 0949   HCT 34.0 (L) 03/21/2023 1232   HCT 36.3 (L) 08/25/2022 0949   PLT 211 03/21/2023 1232   PLT 288 08/25/2022 0949   MCV 93.4 03/21/2023 1232   MCV 90 08/25/2022 0949   MCH 31.3 03/21/2023 1232   MCHC 33.5 03/21/2023 1232   RDW 11.9 03/21/2023 1232   RDW 11.9 08/25/2022 0949   LYMPHSABS 1.8 03/21/2023 1232   LYMPHSABS 2.1 10/08/2021 1525   MONOABS 0.6 03/21/2023 1232   EOSABS 0.1 03/21/2023 1232   EOSABS 0.2 10/08/2021 1525   BASOSABS 0.0 03/21/2023 1232   BASOSABS 0.0 10/08/2021 1525    Lab Results  Component Value Date   HGBA1C 5.3 04/29/2019        Assessment & Plan Prostate Cancer I do not have urology notes to confirm the diagnosis of prostate cancer with slightly elevated PSA of 4.7. Family history raises concerns about surgical outcomes. Early detection with good prognosis. Scheduled urologist consultation on April 4th. - Advise preparation of questions for urologist. - Encourage discussion of cancer stage and survival rates with urologist. - Reassure about early detection benefits and potential positive outcomes.  Depression Feeling down post-cancer diagnosis. Wellbutrin effective for depression and smoking cessation. - Refill Wellbutrin prescription. - Offer therapist referral but he declined counseling at this time  Coronary Artery Disease Managed with Ranexa, Crestor, and Plavix. Repatha managed by cardiologist. - Continue current  medications.  Hypertension Blood pressure well-controlled with current regimen. - Continue current antihypertensive medication.  General Health Maintenance Significant smoking reduction noted. Encouraged to quit entirely to reduce cancer risk. - Encourage complete smoking cessation. - Refill Wellbutrin to aid smoking cessation.  Follow-up Scheduled urologist consultation on April 4th. Primary care follow-up planned post-surgery. - Schedule follow-up appointment six months post-surgery. - Order blood tests to check potassium levels.      Meds ordered this encounter  Medications   amLODipine (NORVASC) 10 MG tablet    Sig: Take 1 tablet (10 mg total) by mouth daily.    Dispense:  90 tablet    Refill:  1   buPROPion (WELLBUTRIN XL) 150 MG 24 hr tablet    Sig: Take 1 tablet (150 mg total) by mouth daily. For smoking cessation    Dispense:  90 tablet    Refill:  1   clopidogrel (PLAVIX) 75 MG  tablet    Sig: Take 1 tablet (75 mg total) by mouth daily.    Dispense:  90 tablet    Refill:  1   losartan-hydrochlorothiazide (HYZAAR) 100-25 MG tablet    Sig: Take 1 tablet by mouth daily.    Dispense:  90 tablet    Refill:  0   ranolazine (RANEXA) 500 MG 12 hr tablet    Sig: Take 1 tablet (500 mg total) by mouth 2 (two) times daily.    Dispense:  180 tablet    Refill:  1   rosuvastatin (CRESTOR) 40 MG tablet    Sig: Take 1 tablet (40 mg total) by mouth daily.    Dispense:  90 tablet    Refill:  1    Follow-up: Return in about 6 months (around 02/23/2024) for Chronic medical conditions.       Hoy Register, MD, FAAFP. [facility] [place], [place] [phone]   08/23/2023, 5:21 PM

## 2023-08-24 ENCOUNTER — Encounter: Payer: Self-pay | Admitting: Family Medicine

## 2023-08-24 LAB — BASIC METABOLIC PANEL
BUN/Creatinine Ratio: 6 — ABNORMAL LOW (ref 10–24)
BUN: 8 mg/dL (ref 8–27)
CO2: 25 mmol/L (ref 20–29)
Calcium: 9.6 mg/dL (ref 8.6–10.2)
Chloride: 99 mmol/L (ref 96–106)
Creatinine, Ser: 1.31 mg/dL — ABNORMAL HIGH (ref 0.76–1.27)
Glucose: 74 mg/dL (ref 70–99)
Potassium: 3.9 mmol/L (ref 3.5–5.2)
Sodium: 139 mmol/L (ref 134–144)
eGFR: 62 mL/min/{1.73_m2} (ref >59)

## 2023-09-08 ENCOUNTER — Telehealth: Payer: Self-pay | Admitting: Pharmacy Technician

## 2023-09-08 NOTE — Telephone Encounter (Signed)
 Pharmacy Patient Advocate Encounter  Received notification from [facility] that Prior Authorization for repatha has been APPROVED from 09/08/23 to 09/07/24   PA #/Case ID/Reference #: [id number]

## 2023-09-08 NOTE — Telephone Encounter (Signed)
 Pharmacy Patient Advocate Encounter   Received notification from Onbase that prior authorization for repatha is required/requested.   Insurance verification completed.   The patient is insured through [facility] .   Per test claim: PA required; PA submitted to above mentioned insurance via CoverMyMeds Key/confirmation #/EOC [id number] Status is pending

## 2023-09-14 ENCOUNTER — Telehealth: Payer: Self-pay | Admitting: Internal Medicine

## 2023-09-14 ENCOUNTER — Other Ambulatory Visit: Payer: Self-pay | Admitting: Urology

## 2023-09-14 NOTE — Telephone Encounter (Signed)
   Pre-operative Risk Assessment    Patient Name: [name]  DOB: 1960-07-05 MRN: [id number]   Date of last office visit: 01/18/23 Date of next office visit: Not yet scheduled   Request for Surgical Clearance    Procedure:   Robot-assisted laparoscopic radical prostatectomy and bilateral pelvic lymphadenectomy   Date of Surgery:  Clearance 12/26/23                                Surgeon:  Dr. Doy Gene Surgeon's Group or Practice Name:  [facility] Phone number:  [phone]  Fax number:  [phone]   Type of Clearance Requested:   - Medical  - Pharmacy:  Hold Clopidogrel (Plavix)     Type of Anesthesia:  General    Additional requests/questions:   Caller Inda [place]) stated patient will need medical and pharmacy clearance.  Signed, Jasmin B Wilson   09/14/2023, 4:23 PM

## 2023-09-14 NOTE — Telephone Encounter (Signed)
 I was speaking with pt's wife but we were disconnected. I called back Left message to call back to schedule a tele preop.

## 2023-09-14 NOTE — Telephone Encounter (Signed)
   Name: [name]  DOB: August 03, 1960  MRN: [id number]  Primary Cardiologist: Jann Melody, MD   Preoperative team, please contact this patient and set up a phone call appointment for further preoperative risk assessment. Please obtain consent and complete medication review. Thank you for your help.  Patient's procedure is 12/26/2023 and virtual visit can be scheduled in late June or early July.   I confirm that guidance regarding antiplatelet and oral anticoagulation therapy has been completed and, if necessary, noted below.  Patient can hold Plavix 5 days prior to procedure and should restart postprocedure when surgically safe and hemostasis is achieved.  I also confirmed the patient resides in the state of [place] . As per [facility] Medical Board telemedicine laws, the patient must reside in the state in which the provider is licensed.   Francene Ing, Retha Cast, NP 09/14/2023, 4:52 PM [facility]

## 2023-09-16 ENCOUNTER — Telehealth: Payer: Self-pay

## 2023-09-16 NOTE — Telephone Encounter (Signed)
 Patient called back and was able to schedule him for a tele visit for his pre-op clearance on 10/25/23 @ 9:50. Meds rec and consent done.

## 2023-09-16 NOTE — Telephone Encounter (Signed)
 Patient called back and was able to schedule him for a tele visit for his pre-op clearance on 10/25/23 @ 9:50. Meds rec and consent done.     Patient Consent for Virtual Visit        [name] has provided verbal consent on 09/16/2023 for a virtual visit (video or telephone).   CONSENT FOR VIRTUAL VISIT FOR:  [name]  By participating in this virtual visit I agree to the following:  I hereby voluntarily request, consent and authorize [facility] and its employed or contracted physicians, physician assistants, nurse practitioners or other licensed health care professionals (the Practitioner), to provide me with telemedicine health care services (the "Services") as deemed necessary by the treating Practitioner. I acknowledge and consent to receive the Services by the Practitioner via telemedicine. I understand that the telemedicine visit will involve communicating with the Practitioner through live audiovisual communication technology and the disclosure of certain medical information by electronic transmission. I acknowledge that I have been given the opportunity to request an in-person assessment or other available alternative prior to the telemedicine visit and am voluntarily participating in the telemedicine visit.  I understand that I have the right to withhold or withdraw my consent to the use of telemedicine in the course of my care at any time, without affecting my right to future care or treatment, and that the Practitioner or I may terminate the telemedicine visit at any time. I understand that I have the right to inspect all information obtained and/or recorded in the course of the telemedicine visit and may receive copies of available information for a reasonable fee.  I understand that some of the potential risks of receiving the Services via telemedicine include:  Delay or interruption in medical evaluation due to technological equipment failure or  disruption; Information transmitted may not be sufficient (e.g. poor resolution of images) to allow for appropriate medical decision making by the Practitioner; and/or  In rare instances, security protocols could fail, causing a breach of personal health information.  Furthermore, I acknowledge that it is my responsibility to provide information about my medical history, conditions and care that is complete and accurate to the best of my ability. I acknowledge that Practitioner's advice, recommendations, and/or decision may be based on factors not within their control, such as incomplete or inaccurate data provided by me or distortions of diagnostic images or specimens that may result from electronic transmissions. I understand that the practice of medicine is not an exact science and that Practitioner makes no warranties or guarantees regarding treatment outcomes. I acknowledge that a copy of this consent can be made available to me via my patient portal [facility] MyChart), or I can request a printed copy by calling the office of [facility].    I understand that my insurance will be billed for this visit.   I have read or had this consent read to me. I understand the contents of this consent, which adequately explains the benefits and risks of the Services being provided via telemedicine.  I have been provided ample opportunity to ask questions regarding this consent and the Services and have had my questions answered to my satisfaction. I give my informed consent for the services to be provided through the use of telemedicine in my medical care

## 2023-09-16 NOTE — Telephone Encounter (Signed)
 Called patient no answer, 2nd attempt to schedule preop tele visit, [id number]

## 2023-09-24 ENCOUNTER — Other Ambulatory Visit: Payer: Self-pay | Admitting: Internal Medicine

## 2023-10-24 NOTE — Progress Notes (Unsigned)
 Established Patient Office Visit  Subjective   Patient ID: [name], male    DOB: Sep 24, 1960  Age: 63 y.o. MRN: [id number]  No chief complaint on file.   01/2022 Patient returns in follow-up has not been seen since September of last year.  He has history of hypertension and on arrival blood pressure is 136/78 this is what he runs at home.  He is still smoking a pack a day of cigarettes.  He does complain of right chronic shoulder pain as well.  He saw cardiology in April of this year with no changes.  10/20/22 Patient seen in return follow-up has not been seen since September 2023.  He has had a 3-day history of increased cough productive of thick green mucus he is smoking a pack every 3 days of cigarettes.  Patient also needs a colonoscopy as last 1 was 10 years ago.  On arrival blood pressure is good 120/70.  He maintains medication.  He follows with cardiology and did have palpitations Zio patch showed intermittent SVT no atrial fibrillation therefore he is not receiving an anticoagulant.  He needs refills on multiple medications.  10/27/23 Saw newlin march 25      Patient Active Problem List   Diagnosis Date Noted   Palpitations 08/25/2022   Rising PSA level 03/06/2019   Family history of colon cancer 10/19/2018   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   Spinal stenosis at L4-L5 level 09/22/2016   AV block    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   Depression 11/19/2015   Primary osteoarthritis of first carpometacarpal joint of left hand 10/23/2015   Chronic renal insufficiency, stage II (mild) 07/02/2014   Coronary artery disease involving native coronary artery of native heart with angina pectoris (HCC)    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/2013   Migraine headache 03/26/2013   GERD (gastroesophageal reflux disease) 11/14/2012   Routine general medical examination at a health care facility 07/28/2012   ERECTILE DYSFUNCTION 08/12/2010    Hyperlipidemia with target LDL less than 70 10/15/2009   TOBACCO USE 10/15/2009   Essential hypertension 10/15/2009   MYOCARDIAL INFARCTION, HX OF 10/15/2009   Past Medical History:  Diagnosis Date   AC (acromioclavicular) arthritis 11/23/2018   Injected November 23, 2018   Anemia    Apical variant hypertrophic cardiomyopathy (HCC)    CAD (coronary artery disease)    a. LHC in 2011 with severe dz in D1/D2 and very distal LAD--> too small for PCI and managed medically.  b.  LHC in 10/2013 after an abnormal nuclear study w/ non-obst dz/c. 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   HOH (hard of hearing)    rgiht ear   Hyperlipidemia    Hypertension    Migraine headache    Stroke [facility])    Past Surgical History:  Procedure Laterality Date   CARDIAC CATHETERIZATION N/A 02/24/2016   Procedure: Left Heart Cath and Coronary Angiography;  Surgeon: Arnoldo Lapping, MD;  Location: [facility] INVASIVE CV LAB;  Service: Cardiovascular;  Laterality: N/A;   CARDIAC CATHETERIZATION N/A 02/24/2016   Procedure: Coronary Stent Intervention;  Surgeon: Arnoldo Lapping,  MD;  Location: [facility] INVASIVE CV LAB;  Service: Cardiovascular;  Laterality: N/A;   CORONARY ANGIOPLASTY WITH STENT PLACEMENT  2017   CORONARY BALLOON ANGIOPLASTY N/A 04/30/2019   Procedure: CORONARY BALLOON ANGIOPLASTY;  Surgeon: Swaziland, Peter M, MD;  Location: [facility] INVASIVE CV LAB;  Service: Cardiovascular;  Laterality: N/A;   INGUINAL HERNIA REPAIR Left    LEFT HEART CATH AND CORONARY ANGIOGRAPHY N/A 04/30/2019   Procedure: LEFT HEART CATH AND CORONARY ANGIOGRAPHY;  Surgeon: Mardell Shade, MD;  Location: [facility] INVASIVE CV LAB;   Service: Cardiovascular;  Laterality: N/A;   LEFT HEART CATH AND CORONARY ANGIOGRAPHY N/A 05/14/2019   Procedure: LEFT HEART CATH AND CORONARY ANGIOGRAPHY;  Surgeon: Swaziland, Peter M, MD;  Location: [facility] INVASIVE CV LAB;  Service: Cardiovascular;  Laterality: N/A;   LEFT HEART CATHETERIZATION WITH CORONARY ANGIOGRAM N/A 11/22/2013   Procedure: LEFT HEART CATHETERIZATION WITH CORONARY ANGIOGRAM;  Surgeon: Mickiel Albany, MD;  Location: [facility] CATH LAB;  Service: Cardiovascular;  Laterality: N/A;   TEE WITHOUT CARDIOVERSION N/A 08/22/2015   Procedure: TRANSESOPHAGEAL ECHOCARDIOGRAM (TEE);  Surgeon: Lake Pilgrim, MD;  Location: [facility] ENDOSCOPY;  Service: Cardiovascular;  Laterality: N/A;   Social History   Tobacco Use   Smoking status: Some Days    Current packs/day: 1.50    Types: Cigarettes   Smokeless tobacco: Never  Vaping Use   Vaping status: Never Used  Substance Use Topics   Alcohol use: No    Alcohol/week: 0.0 standard drinks of alcohol   Drug use: No   Social History   Socioeconomic History   Marital status: Married    Spouse name: Not on file   Number of children: 3   Years of education: Not on file   Highest education level: Not on file  Occupational History   Occupation: [employer]  Tobacco Use   Smoking status: Some Days    Current packs/day: 1.50    Types: Cigarettes   Smokeless tobacco: Never  Vaping Use   Vaping status: Never Used  Substance and Sexual Activity   Alcohol use: No    Alcohol/week: 0.0 standard drinks of alcohol   Drug use: No   Sexual activity: Not Currently  Other Topics Concern   Not on file  Social History Narrative   18 GRANDKIDS!!!   Social Drivers of Health   Financial Resource Strain: Medium Risk (03/09/2023)   Overall Financial Resource Strain (CARDIA)    Difficulty of Paying Living Expenses: Somewhat hard  Food Insecurity: Food Insecurity Present (03/09/2023)   Hunger Vital Sign    Worried About Running Out of Food  in the Last Year: Often true    Ran Out of Food in the Last Year: Often true  Transportation Needs: No Transportation Needs (03/09/2023)   PRAPARE - Administrator, Civil Service (Medical): No    Lack of Transportation (Non-Medical): No  Physical Activity: Inactive (03/09/2023)   Exercise Vital Sign    Days of Exercise per Week: 0 days    Minutes of Exercise per Session: 0 min  Stress: Stress Concern Present (03/09/2023)   [employer] of Occupational Health - Occupational Stress Questionnaire    Feeling of Stress : To some extent  Social Connections: Moderately Isolated (03/09/2023)   Social Connection and Isolation Panel [NHANES]    Frequency of Communication with Friends and Family: Once a week    Frequency of Social Gatherings with Friends and Family: Never    Attends Religious Services: Never  Active Member of Clubs or Organizations: Yes    Attends Banker Meetings: 1 to 4 times per year    Marital Status: Married  Catering manager Violence: Not At Risk (03/09/2023)   Humiliation, Afraid, Rape, and Kick questionnaire    Fear of Current or Ex-Partner: No    Emotionally Abused: No    Physically Abused: No    Sexually Abused: No   Family Status  Relation Name Status   Mother  Alive   Father  Deceased   Sister  Alive   Sister  Alive   Sister  Alive   Sister  Alive   Sister  Alive   Sister  Alive   Sister  Alive   Brother  Alive   Brother  Alive   Brother  Alive   Brother  Alive   MGM  Deceased   MGF  Deceased   PGM  Deceased   PGF  Deceased   Daughter  Alive   Son  Alive   Son  Alive   Nutritional therapist  (Not Specified)  No partnership data on file   Family History  Problem Relation Age of Onset   Hypertension Mother    Heart failure Mother    Hypertension Father    Colon cancer Father 33   Hypertension Brother    Heart disease Son    Heart disease Son    Colon cancer Paternal Uncle    Allergies  Allergen Reactions   Lisinopril  Swelling    Facial swelling      Review of Systems  Constitutional:  Negative for chills, diaphoresis, fever, malaise/fatigue and weight loss.       No appt  HENT:  Negative for congestion, hearing loss, nosebleeds, sore throat and tinnitus.   Eyes:  Negative for blurred vision, photophobia and redness.  Respiratory:  Negative for cough, hemoptysis, sputum production, shortness of breath, wheezing and stridor.   Cardiovascular:  Negative for chest pain, palpitations, orthopnea, claudication, leg swelling and PND.  Gastrointestinal:  Negative for abdominal pain, blood in stool, constipation, diarrhea, heartburn, nausea and vomiting.  Genitourinary:  Negative for dysuria, flank pain, frequency, hematuria and urgency.  Musculoskeletal:  Negative for back pain, falls, joint pain, myalgias and neck pain.       Knot on shoulder Right  Skin:  Negative for itching and rash.  Neurological:  Negative for dizziness, tingling, tremors, sensory change, speech change, focal weakness, seizures, loss of consciousness, weakness and headaches.  Endo/Heme/Allergies:  Negative for environmental allergies and polydipsia. Does not bruise/bleed easily.  Psychiatric/Behavioral:  Negative for depression, memory loss, substance abuse and suicidal ideas. The patient is not nervous/anxious and does not have insomnia.       Objective:     There were no vitals taken for this visit. BP Readings from Last 3 Encounters:  08/23/23 137/76  05/10/23 120/70  03/21/23 136/81   Wt Readings from Last 3 Encounters:  08/23/23 174 lb (78.9 kg)  05/10/23 166 lb (75.3 kg)  03/21/23 159 lb (72.1 kg)      Physical Exam Vitals reviewed.  Constitutional:      Appearance: Normal appearance. He is well-developed and normal weight. He is not diaphoretic.  HENT:     Head: Normocephalic and atraumatic.     Nose: No nasal deformity, septal deviation, mucosal edema or rhinorrhea.     Right Sinus: No maxillary sinus tenderness  or frontal sinus tenderness.     Left Sinus: No maxillary sinus tenderness  or frontal sinus tenderness.     Mouth/Throat:     Pharynx: No oropharyngeal exudate.  Eyes:     General: No scleral icterus.    Conjunctiva/sclera: Conjunctivae normal.     Pupils: Pupils are equal, round, and reactive to light.  Neck:     Thyroid : No thyromegaly.     Vascular: No carotid bruit or JVD.     Trachea: Trachea normal. No tracheal tenderness or tracheal deviation.  Cardiovascular:     Rate and Rhythm: Normal rate and regular rhythm.     Chest Wall: PMI is not displaced.     Pulses: Normal pulses. No decreased pulses.     Heart sounds: Normal heart sounds, S1 normal and S2 normal. Heart sounds not distant. No murmur heard.    No systolic murmur is present.     No diastolic murmur is present.     No friction rub. No gallop. No S3 or S4 sounds.  Pulmonary:     Effort: No tachypnea, accessory muscle usage or respiratory distress.     Breath sounds: No stridor. No decreased breath sounds, wheezing, rhonchi or rales.  Chest:     Chest wall: No tenderness.  Abdominal:     General: Bowel sounds are normal. There is no distension.     Palpations: Abdomen is soft. Abdomen is not rigid.     Tenderness: There is no abdominal tenderness. There is no guarding or rebound.  Musculoskeletal:        General: No swelling or tenderness. Normal range of motion.     Cervical back: Normal range of motion and neck supple. No edema, erythema or rigidity. No muscular tenderness. Normal range of motion.  Lymphadenopathy:     Head:     Right side of head: No submental or submandibular adenopathy.     Left side of head: No submental or submandibular adenopathy.     Cervical: No cervical adenopathy.  Skin:    General: Skin is warm and dry.     Coloration: Skin is not pale.     Findings: No rash.     Nails: There is no clubbing.  Neurological:     Mental Status: He is alert and oriented to person, place, and time.      Sensory: No sensory deficit.  Psychiatric:        Speech: Speech normal.        Behavior: Behavior normal.      No results found for any visits on 10/27/23.  Last CBC Lab Results  Component Value Date   WBC 8.3 03/21/2023   HGB 11.4 (L) 03/21/2023   HCT 34.0 (L) 03/21/2023   MCV 93.4 03/21/2023   MCH 31.3 03/21/2023   RDW 11.9 03/21/2023   PLT 211 03/21/2023   Last metabolic panel Lab Results  Component Value Date   GLUCOSE 74 08/23/2023   NA 139 08/23/2023   K 3.9 08/23/2023   CL 99 08/23/2023   CO2 25 08/23/2023   BUN 8 08/23/2023   CREATININE 1.31 (H) 08/23/2023   EGFR 62 08/23/2023   CALCIUM  9.6 08/23/2023   PHOS 3.5 09/17/2009   PROT 7.1 01/21/2023   ALBUMIN 4.2 01/21/2023   LABGLOB 2.5 06/23/2022   AGRATIO 1.8 06/23/2022   BILITOT 0.7 01/21/2023   ALKPHOS 62 01/21/2023   AST 26 01/21/2023   ALT 15 01/21/2023   ANIONGAP 8 03/21/2023   Last lipids Lab Results  Component Value Date   CHOL 102 06/23/2022  HDL 60 06/23/2022   LDLCALC 32 06/23/2022   TRIG 36 06/23/2022   CHOLHDL 1.7 06/23/2022   Last hemoglobin A1c Lab Results  Component Value Date   HGBA1C 5.3 04/29/2019      The ASCVD Risk score (Arnett DK, et al., 2019) failed to calculate for the following reasons:   Risk score cannot be calculated because patient has a medical history suggesting prior/existing ASCVD    Assessment & Plan:   Problem List Items Addressed This Visit   None  Patient agreed and received tetanus vaccine No follow-ups on file.    Arlene Lacy, MD

## 2023-10-25 ENCOUNTER — Ambulatory Visit: Attending: Cardiology

## 2023-10-25 ENCOUNTER — Telehealth: Payer: Self-pay | Admitting: Nurse Practitioner

## 2023-10-25 DIAGNOSIS — Z01810 Encounter for preprocedural cardiovascular examination: Secondary | ICD-10-CM

## 2023-10-25 NOTE — Progress Notes (Signed)
 Virtual Visit via Telephone Note   Because of [name] co-morbid illnesses, he is at least at moderate risk for complications without adequate follow up.  This format is felt to be most appropriate for this patient at this time.  Due to technical limitations with video connection (technology), today's appointment will be conducted as an audio only telehealth visit, and [name] verbally agreed to proceed in this manner.   All issues noted in this document were discussed and addressed.  No physical exam could be performed with this format.  Evaluation Performed:  Preoperative cardiovascular risk assessment _____________   Date:  10/25/2023   Patient ID:  [name], DOB December 08, 1960, MRN [id number] Patient Location:  Home Provider location:   Office  Primary Care Provider:  Joaquin Mulberry, MD Primary Cardiologist:  Jann Melody, MD  Chief Complaint / Patient Profile   63 y.o. y/o male with a h/o CAD s/p NSTEMI 2017 with PCI to ramus intermedius, hypertrophic CM, HLD, HTN, tobacco abuse, prior CVA who is pending robotic assisted laparoscopic radical prostatectomy and bilateral pelvic lymphadenectomy and presents today for telephonic preoperative cardiovascular risk assessment.  History of Present Illness    [name] is a 63 y.o. male who presents via audio/video conferencing for a telehealth visit today.  Pt was last seen in cardiology clinic on 01/18/2023 by Dr. Annabelle Barrack.  At that time Taelon Bendorf was doing well no complaints of chest pain and no medication adjustments made at visit.  The patient is now pending procedure as outlined above. Since his last visit, he has been doing well with no new cardiac complaints.  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  He is able to walk constantly without any difficulty and can complete greater than 4 METS of activity with no problems.  He denies chest  pain, shortness of breath, lower extremity edema, fatigue, palpitations, melena, hematuria, hemoptysis, diaphoresis, weakness, presyncope, syncope, orthopnea, and PND.    Past Medical History    Past Medical History:  Diagnosis Date   AC (acromioclavicular) arthritis 11/23/2018   Injected November 23, 2018   Anemia    Apical variant hypertrophic cardiomyopathy (HCC)    CAD (coronary artery disease)    a. LHC in 2011 with severe dz in D1/D2 and very distal LAD--> too small for PCI and managed medically.  b.  LHC in 10/2013 after an abnormal nuclear study w/ non-obst dz/c. 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   HOH (hard of hearing)    rgiht ear   Hyperlipidemia    Hypertension    Migraine headache    Stroke [facility])    Past Surgical History:  Procedure Laterality Date   CARDIAC CATHETERIZATION N/A 02/24/2016   Procedure: Left Heart Cath and Coronary Angiography;  Surgeon: Arnoldo Lapping, MD;  Location: [facility] INVASIVE CV LAB;  Service: Cardiovascular;  Laterality: N/A;   CARDIAC CATHETERIZATION N/A 02/24/2016   Procedure: Coronary Stent Intervention;  Surgeon: Arnoldo Lapping, MD;  Location: [facility] INVASIVE CV LAB;  Service: Cardiovascular;  Laterality: N/A;   CORONARY ANGIOPLASTY WITH STENT PLACEMENT  2017   CORONARY BALLOON ANGIOPLASTY N/A 04/30/2019   Procedure: CORONARY BALLOON ANGIOPLASTY;  Surgeon: Swaziland, Peter M, MD;  Location: [facility] INVASIVE CV LAB;  Service: Cardiovascular;  Laterality: N/A;   INGUINAL HERNIA REPAIR Left    LEFT HEART CATH AND CORONARY ANGIOGRAPHY N/A 04/30/2019   Procedure: LEFT HEART CATH AND CORONARY ANGIOGRAPHY;  Surgeon: Mardell Shade,  MD;  Location: [facility] INVASIVE CV LAB;  Service: Cardiovascular;  Laterality: N/A;   LEFT HEART CATH AND CORONARY ANGIOGRAPHY N/A 05/14/2019   Procedure: LEFT HEART CATH AND CORONARY ANGIOGRAPHY;  Surgeon: Swaziland, Peter M, MD;  Location: [facility] INVASIVE CV LAB;  Service: Cardiovascular;  Laterality: N/A;   LEFT HEART CATHETERIZATION WITH CORONARY ANGIOGRAM N/A 11/22/2013   Procedure: LEFT HEART CATHETERIZATION WITH CORONARY ANGIOGRAM;  Surgeon: Mickiel Albany, MD;  Location: [facility] CATH LAB;  Service: Cardiovascular;  Laterality: N/A;   TEE WITHOUT CARDIOVERSION N/A 08/22/2015   Procedure: TRANSESOPHAGEAL ECHOCARDIOGRAM (TEE);  Surgeon: Lake Pilgrim, MD;  Location: [facility] ENDOSCOPY;  Service: Cardiovascular;  Laterality: N/A;    Allergies  Allergies  Allergen Reactions   Lisinopril Swelling    Facial swelling    Home Medications    Prior to Admission medications   Medication Sig Start Date End Date Taking? Authorizing Provider  amLODipine  (NORVASC ) 10 MG tablet Take 1 tablet (10 mg total) by mouth daily. 08/23/23   Newlin, Enobong, MD  buPROPion  (WELLBUTRIN  XL) 150 MG 24 hr tablet Take 1 tablet (150 mg total) by mouth daily. For smoking cessation 08/23/23   Newlin, Enobong, MD  clopidogrel  (PLAVIX ) 75 MG tablet Take 1 tablet (75 mg total) by mouth daily. 08/23/23   Newlin, Enobong, MD  Evolocumab  (REPATHA  SURECLICK) 140 MG/ML SOAJ INJECT 140 MG INTO THE SKIN EVERY 14 (FOURTEEN) DAYS. 09/26/23   Jann Melody, MD  gabapentin  (NEURONTIN ) 300 MG capsule Take 1 capsule (300 mg total) by mouth 2 (two) times daily. 10/20/22 06/20/23  Vernell Goldsmith, MD  losartan -hydrochlorothiazide  (HYZAAR ) 100-25 MG tablet Take 1 tablet by mouth daily. 08/23/23   Newlin, Enobong, MD  naproxen  (NAPROSYN ) 500 MG tablet Take by mouth as needed for moderate pain (pain score 4-6). 10/15/15   [provider]  nitroGLYCERIN  (NITROSTAT ) 0.4 MG SL tablet Place 1 tablet (0.4 mg total) under the tongue every 5 (five)  minutes as needed for chest pain. 08/25/22   Jann Melody, MD  potassium chloride  SA (KLOR-CON  M) 20 MEQ tablet Take 2 tablets (40 mEq total) by mouth daily. X 7 days 01/26/23   Albertina Hugger, MD  ranolazine  (RANEXA ) 500 MG 12 hr tablet Take 1 tablet (500 mg total) by mouth 2 (two) times daily. 08/23/23   Newlin, Enobong, MD  rosuvastatin  (CRESTOR ) 40 MG tablet Take 1 tablet (40 mg total) by mouth daily. 08/23/23   Newlin, Enobong, MD  tamsulosin (FLOMAX) 0.4 MG CAPS capsule Take 0.4 mg by mouth at bedtime. 06/11/23   [provider]    Physical Exam    Vital Signs:  Aldo Sondgeroth does not have vital signs available for review today.  Given telephonic nature of communication, physical exam is limited. AAOx3. NAD. Normal affect.  Speech and respirations are unlabored.  Accessory Clinical Findings    None  Assessment & Plan    1.  Preoperative Cardiovascular Risk Assessment: - Patient's RCRI score is 11%  The patient affirms he has been doing well without any new cardiac symptoms. They are able to achieve 7 METS without  cardiac limitations. Therefore, based on ACC/AHA guidelines, the patient would be at acceptable risk for the planned procedure without further cardiovascular testing. The patient was advised that if he develops new symptoms prior to surgery to contact our office to arrange for a follow-up visit, and he verbalized understanding.   The patient was advised that if he develops new symptoms prior to surgery to contact our office to arrange for a follow-up visit, and he verbalized understanding.  Patient can hold Plavix  5 days prior to procedure and should restart postprocedure when surgically safe and hemostasis is achieved.   A copy of this note will be routed to requesting surgeon.  Time:   Today, I have spent 8 minutes with the patient with telehealth technology discussing medical history, symptoms, and management plan.     Francene Ing, Retha Cast,  NP  10/25/2023, 7:19 AM

## 2023-10-25 NOTE — Telephone Encounter (Signed)
 Call placed this morning at 9:20 AM for scheduled preoperative clearance visit.  Patient was not available and detailed message left for instructions to return call at earliest convenience.  Charles Connor, NP

## 2023-10-27 ENCOUNTER — Encounter: Payer: Self-pay | Admitting: Critical Care Medicine

## 2023-10-27 ENCOUNTER — Ambulatory Visit: Attending: Critical Care Medicine | Admitting: Critical Care Medicine

## 2023-10-27 VITALS — BP 137/76 | HR 55 | Ht 68.0 in | Wt 165.6 lb

## 2023-10-27 DIAGNOSIS — I10 Essential (primary) hypertension: Secondary | ICD-10-CM

## 2023-10-27 DIAGNOSIS — L84 Corns and callosities: Secondary | ICD-10-CM | POA: Diagnosis not present

## 2023-10-27 DIAGNOSIS — I25119 Atherosclerotic heart disease of native coronary artery with unspecified angina pectoris: Secondary | ICD-10-CM

## 2023-10-27 DIAGNOSIS — B351 Tinea unguium: Secondary | ICD-10-CM | POA: Insufficient documentation

## 2023-10-27 DIAGNOSIS — C61 Malignant neoplasm of prostate: Secondary | ICD-10-CM

## 2023-10-27 DIAGNOSIS — Z789 Other specified health status: Secondary | ICD-10-CM

## 2023-10-27 MED ORDER — TERBINAFINE HCL 250 MG PO TABS: 250.0000 mg | ORAL_TABLET | Freq: Every day | ORAL | 0 refills | Status: DC

## 2023-10-27 NOTE — Assessment & Plan Note (Signed)
 Hypertension currently well-controlled continue with losartan  HCT and amlodipine  patient has refills

## 2023-10-27 NOTE — Patient Instructions (Addendum)
 Take terbinafine 1 daily for 30 days Put a spacer between the toes where the calluses you can get those over-the-counter  You have toenail fungus if the condition worsens we can send you to a foot doctor  Keep follow-up visits with your primary care

## 2023-10-27 NOTE — Assessment & Plan Note (Signed)
 Prescribed terbinafine 250 mg daily for 30 days assess liver function today and recheck in 6 weeks

## 2023-10-27 NOTE — Assessment & Plan Note (Signed)
 This is between the 4th and 5th toe of the left foot patient will put toe spacers in this area if it is unimproved he will need to see podiatry

## 2023-10-27 NOTE — Assessment & Plan Note (Signed)
 Continue with current medications no change

## 2023-10-27 NOTE — Assessment & Plan Note (Signed)
 Elevated PSA now has prostate cancer it appears to be contained within the prostate gland and he is can have a total prostatectomy in July

## 2023-10-28 ENCOUNTER — Ambulatory Visit: Payer: Self-pay | Admitting: Critical Care Medicine

## 2023-10-28 LAB — HEPATIC FUNCTION PANEL
ALT: 11 IU/L (ref 0–44)
AST: 20 IU/L (ref 0–40)
Albumin: 4.5 g/dL (ref 3.9–4.9)
Alkaline Phosphatase: 83 IU/L (ref 44–121)
Bilirubin Total: 0.4 mg/dL (ref 0.0–1.2)
Bilirubin, Direct: 0.22 mg/dL (ref 0.00–0.40)
Total Protein: 7.3 g/dL (ref 6.0–8.5)

## 2023-10-28 NOTE — Progress Notes (Signed)
 Let patient know liver function normal begin the oral terbinafine  as prescribed return in follow-up for labs to recheck liver function in about 3 to 4 weeks

## 2023-11-03 ENCOUNTER — Ambulatory Visit: Payer: Self-pay

## 2023-11-03 NOTE — Telephone Encounter (Signed)
 Yes please.  Visit can be virtual or in person.  Thanks

## 2023-11-03 NOTE — Telephone Encounter (Signed)
 Does patient need visit to discuss?  Having sciatica pain requesting pain medication.

## 2023-11-03 NOTE — Telephone Encounter (Signed)
 2nd attempt, left voicemail for patient to return call for nurse triage.

## 2023-11-03 NOTE — Telephone Encounter (Signed)
 1st attempt, voicemail box is full and not accepting new messages. Placed in call back folder.  Copied from CRM [id number]. Topic: Clinical - Medical Advice >> Nov 03, 2023  8:06 AM Loreda Rodriguez T wrote: Reason for CRM: patients wife Dina Francisco called stated patients sciatic nerve in his right lower back that radiates down his right butt cheek to his leg. Pam wants to know if there is anything he can take over the counter or if the provider will call him in something for the pain. Please f/u with patient at [phone]

## 2023-11-03 NOTE — Telephone Encounter (Signed)
 3rd call attempt. Message left to return call. Routing for no contact follow up.  Reason for Disposition . Third attempt to contact caller AND no contact made. Phone number verified.  Protocols used: No Contact or Duplicate Contact Call-A-AH

## 2023-11-03 NOTE — Telephone Encounter (Signed)
 Call placed to patient and no Vm set up to leave a message. Patient has been sent a MyChart message with available appointment for 11/08/23 at 130 over the phone.

## 2023-11-08 ENCOUNTER — Encounter: Payer: Self-pay | Admitting: Family Medicine

## 2023-11-08 ENCOUNTER — Ambulatory Visit: Attending: Family Medicine | Admitting: Family Medicine

## 2023-11-08 VITALS — BP 149/74 | HR 61 | Ht 68.0 in | Wt 165.8 lb

## 2023-11-08 DIAGNOSIS — I10 Essential (primary) hypertension: Secondary | ICD-10-CM

## 2023-11-08 DIAGNOSIS — M5431 Sciatica, right side: Secondary | ICD-10-CM

## 2023-11-08 DIAGNOSIS — F32A Depression, unspecified: Secondary | ICD-10-CM

## 2023-11-08 MED ORDER — DULOXETINE HCL 60 MG PO CPEP: 60.0000 mg | ORAL_CAPSULE | Freq: Every day | ORAL | 1 refills | Status: DC

## 2023-11-08 MED ORDER — LOSARTAN POTASSIUM-HCTZ 100-25 MG PO TABS: 1.0000 | ORAL_TABLET | Freq: Every day | ORAL | 1 refills | Status: AC

## 2023-11-08 NOTE — Progress Notes (Signed)
 Subjective:  Patient ID: [name], male    DOB: 07-27-1960  Age: 63 y.o. MRN: [id number]  CC: Medical Management of Chronic Issues (No concerns)     Discussed the use of AI scribe software for clinical note transcription with the patient, who gave verbal consent to proceed.  History of Present Illness [name] is a 63 year old male with a history of hypertrophic cardiomyopathy, CAD (currently on Ranexa ), hypertension, nicotine  dependence (35 pack years), prostate cancer ( Gleason score 3=4+7 in 2 cores, 3=3=6 in 1 core) who presents with right hip pain.  He experiences significant right hip pain radiating to the buttocks and leg for three weeks. The pain is initially dull but becomes severe, affecting his ability to sit, lie down, and sleep. It worsens with standing, requiring posture adjustments for relief.   He is scheduled for radical left prostatectomy with bilateral PL ND on July 28th and has chosen this over radiation treatment. He is stressed about his diagnosis and concerned for his family's well-being.  He takes amlodipine  and losartan /hydrochlorothiazide  for hypertension but has missed doses recently, possibly contributing to elevated blood pressure. He is on Wellbutrin  for smoking cessation and depression. He has coronary artery disease and a cardiology follow-up next week. He works at [employer] and has significantly reduced smoking.     Past Medical History:  Diagnosis Date   AC (acromioclavicular) arthritis 11/23/2018   Injected November 23, 2018   Anemia    Apical variant hypertrophic cardiomyopathy (HCC)    CAD (coronary artery disease)    a. LHC in 2011 with severe dz in D1/D2 and very distal LAD--> too small for PCI and managed medically.  b.  LHC in 10/2013 after an abnormal nuclear study w/ non-obst dz/c. 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   HOH (hard of hearing)    rgiht ear   Hyperlipidemia    Hypertension    Migraine headache    Stroke [facility])     Past Surgical History:  Procedure Laterality Date   CARDIAC CATHETERIZATION N/A 02/24/2016   Procedure: Left Heart Cath and Coronary Angiography;  Surgeon: Arnoldo Lapping, MD;  Location: [facility] INVASIVE CV LAB;  Service: Cardiovascular;  Laterality: N/A;   CARDIAC CATHETERIZATION N/A 02/24/2016   Procedure: Coronary Stent Intervention;  Surgeon: Arnoldo Lapping, MD;  Location: [facility] INVASIVE CV LAB;  Service: Cardiovascular;  Laterality: N/A;   CORONARY ANGIOPLASTY WITH STENT PLACEMENT  2017   CORONARY BALLOON ANGIOPLASTY N/A 04/30/2019   Procedure: CORONARY BALLOON ANGIOPLASTY;  Surgeon: Swaziland, Peter M, MD;  Location: [facility] INVASIVE CV LAB;  Service: Cardiovascular;  Laterality: N/A;   INGUINAL HERNIA REPAIR Left    LEFT HEART CATH AND CORONARY ANGIOGRAPHY N/A 04/30/2019   Procedure: LEFT HEART CATH AND CORONARY ANGIOGRAPHY;  Surgeon: Mardell Shade, MD;  Location: [facility] INVASIVE CV LAB;  Service: Cardiovascular;  Laterality: N/A;   LEFT HEART CATH AND CORONARY ANGIOGRAPHY N/A 05/14/2019   Procedure: LEFT HEART CATH AND CORONARY ANGIOGRAPHY;  Surgeon: Swaziland, Peter M, MD;  Location: [facility] INVASIVE CV LAB;  Service: Cardiovascular;  Laterality: N/A;  LEFT HEART CATHETERIZATION WITH CORONARY ANGIOGRAM N/A 11/22/2013   Procedure: LEFT HEART CATHETERIZATION WITH CORONARY ANGIOGRAM;  Surgeon: Mickiel Albany, MD;  Location: [facility] CATH LAB;  Service: Cardiovascular;  Laterality: N/A;   TEE WITHOUT CARDIOVERSION N/A 08/22/2015   Procedure: TRANSESOPHAGEAL ECHOCARDIOGRAM (TEE);  Surgeon: Lake Pilgrim, MD;  Location: [facility] ENDOSCOPY;  Service: Cardiovascular;   Laterality: N/A;    Family History  Problem Relation Age of Onset   Hypertension Mother    Heart failure Mother    Hypertension Father    Colon cancer Father 43   Hypertension Brother    Heart disease Son    Heart disease Son    Colon cancer Paternal Uncle     Social History   Socioeconomic History   Marital status: Married    Spouse name: Not on file   Number of children: 3   Years of education: Not on file   Highest education level: Not on file  Occupational History   Occupation: Museum/gallery conservator  Tobacco Use   Smoking status: Some Days    Current packs/day: 1.50    Types: Cigarettes   Smokeless tobacco: Never  Vaping Use   Vaping status: Never Used  Substance and Sexual Activity   Alcohol use: No    Alcohol/week: 0.0 standard drinks of alcohol   Drug use: No   Sexual activity: Not Currently  Other Topics Concern   Not on file  Social History Narrative   18 GRANDKIDS!!!   Social Drivers of Health   Financial Resource Strain: Medium Risk (03/09/2023)   Overall Financial Resource Strain (CARDIA)    Difficulty of Paying Living Expenses: Somewhat hard  Food Insecurity: Food Insecurity Present (03/09/2023)   Hunger Vital Sign    Worried About Running Out of Food in the Last Year: Often true    Ran Out of Food in the Last Year: Often true  Transportation Needs: No Transportation Needs (03/09/2023)   PRAPARE - Administrator, Civil Service (Medical): No    Lack of Transportation (Non-Medical): No  Physical Activity: Inactive (03/09/2023)   Exercise Vital Sign    Days of Exercise per Week: 0 days    Minutes of Exercise per Session: 0 min  Stress: Stress Concern Present (03/09/2023)   [employer] of Occupational Health - Occupational Stress Questionnaire    Feeling of Stress : To some extent  Social Connections: Moderately Isolated (03/09/2023)   Social Connection and Isolation Panel [NHANES]    Frequency of Communication with Friends and  Family: Once a week    Frequency of Social Gatherings with Friends and Family: Never    Attends Religious Services: Never    Database administrator or Organizations: Yes    Attends Engineer, structural: 1 to 4 times per year    Marital Status: Married    Allergies  Allergen Reactions   Lisinopril Swelling    Facial swelling    Outpatient Medications Prior to Visit  Medication Sig Dispense Refill   amLODipine  (NORVASC ) 10 MG tablet Take 1 tablet (10 mg total) by mouth daily. 90 tablet 1   buPROPion  (WELLBUTRIN  XL) 150 MG 24 hr tablet Take 1 tablet (150 mg total) by mouth daily. For smoking cessation 90 tablet 1   clopidogrel  (PLAVIX ) 75 MG tablet Take 1 tablet (75 mg total) by mouth daily. 90 tablet 1   Evolocumab  (REPATHA  SURECLICK) 140 MG/ML SOAJ INJECT 140 MG INTO THE SKIN EVERY 14 (FOURTEEN) DAYS. 6  mL 3   naproxen  (NAPROSYN ) 500 MG tablet Take by mouth as needed for moderate pain (pain score 4-6).     nitroGLYCERIN  (NITROSTAT ) 0.4 MG SL tablet Place 1 tablet (0.4 mg total) under the tongue every 5 (five) minutes as needed for chest pain. 25 tablet 3   potassium chloride  SA (KLOR-CON  M) 20 MEQ tablet Take 2 tablets (40 mEq total) by mouth daily. X 7 days 14 tablet 0   ranolazine  (RANEXA ) 500 MG 12 hr tablet Take 1 tablet (500 mg total) by mouth 2 (two) times daily. 180 tablet 1   rosuvastatin  (CRESTOR ) 40 MG tablet Take 1 tablet (40 mg total) by mouth daily. 90 tablet 1   tamsulosin (FLOMAX) 0.4 MG CAPS capsule Take 0.4 mg by mouth at bedtime.     terbinafine  (LAMISIL ) 250 MG tablet Take 1 tablet (250 mg total) by mouth daily. 30 tablet 0   losartan -hydrochlorothiazide  (HYZAAR ) 100-25 MG tablet Take 1 tablet by mouth daily. 90 tablet 0   gabapentin  (NEURONTIN ) 300 MG capsule Take 1 capsule (300 mg total) by mouth 2 (two) times daily. 60 capsule 1   No facility-administered medications prior to visit.     ROS Review of Systems  Constitutional:  Negative for activity  change and appetite change.  HENT:  Negative for sinus pressure and sore throat.   Respiratory:  Negative for chest tightness, shortness of breath and wheezing.   Cardiovascular:  Negative for chest pain and palpitations.  Gastrointestinal:  Negative for abdominal distention, abdominal pain and constipation.  Genitourinary: Negative.   Musculoskeletal:  Positive for back pain.  Psychiatric/Behavioral:  Positive for dysphoric mood. Negative for behavioral problems.     Objective:  BP (!) 149/74   Pulse 61   Ht 5\' 8"  (1.727 m)   Wt 165 lb 12.8 oz (75.2 kg)   SpO2 100%   BMI 25.21 kg/m      11/08/2023    2:07 PM 11/08/2023    1:31 PM 10/27/2023    8:41 AM  BP/Weight  Systolic BP 149 156 137  Diastolic BP 74 76 76  Wt. (Lbs)  165.8 165.6  BMI  25.21 kg/m2 25.18 kg/m2      Physical Exam Constitutional:      Appearance: He is well-developed.  Cardiovascular:     Rate and Rhythm: Normal rate.     Heart sounds: Normal heart sounds. No murmur heard. Pulmonary:     Effort: Pulmonary effort is normal.     Breath sounds: Normal breath sounds. No wheezing or rales.  Chest:     Chest wall: No tenderness.  Abdominal:     General: Bowel sounds are normal. There is no distension.     Palpations: Abdomen is soft. There is no mass.     Tenderness: There is no abdominal tenderness.  Musculoskeletal:        General: Normal range of motion.     Right lower leg: No edema.     Left lower leg: No edema.  Neurological:     Mental Status: He is alert and oriented to person, place, and time.  Psychiatric:     Comments: Depressed mood        Latest Ref Rng & Units 10/27/2023    9:00 AM 08/23/2023    3:18 PM 03/21/2023   12:32 PM  CMP  Glucose 70 - 99 mg/dL  74  98   BUN 8 - 27 mg/dL  8  14   Creatinine 4.09 -  1.27 mg/dL  8.65  7.84   Sodium 696 - 144 mmol/L  139  136   Potassium 3.5 - 5.2 mmol/L  3.9  3.5   Chloride 96 - 106 mmol/L  99  101   CO2 20 - 29 mmol/L  25  27   Calcium   8.6 - 10.2 mg/dL  9.6  9.1   Total Protein 6.0 - 8.5 g/dL 7.3     Total Bilirubin 0.0 - 1.2 mg/dL 0.4     Alkaline Phos 44 - 121 IU/L 83     AST 0 - 40 IU/L 20     ALT 0 - 44 IU/L 11       Lipid Panel     Component Value Date/Time   CHOL 102 06/23/2022 0902   TRIG 36 06/23/2022 0902   HDL 60 06/23/2022 0902   CHOLHDL 1.7 06/23/2022 0902   CHOLHDL 1.8 05/12/2019 0317   VLDL 7 05/12/2019 0317   LDLCALC 32 06/23/2022 0902    CBC    Component Value Date/Time   WBC 8.3 03/21/2023 1232   RBC 3.64 (L) 03/21/2023 1232   HGB 11.4 (L) 03/21/2023 1232   HGB 12.0 (L) 08/25/2022 0949   HCT 34.0 (L) 03/21/2023 1232   HCT 36.3 (L) 08/25/2022 0949   PLT 211 03/21/2023 1232   PLT 288 08/25/2022 0949   MCV 93.4 03/21/2023 1232   MCV 90 08/25/2022 0949   MCH 31.3 03/21/2023 1232   MCHC 33.5 03/21/2023 1232   RDW 11.9 03/21/2023 1232   RDW 11.9 08/25/2022 0949   LYMPHSABS 1.8 03/21/2023 1232   LYMPHSABS 2.1 10/08/2021 1525   MONOABS 0.6 03/21/2023 1232   EOSABS 0.1 03/21/2023 1232   EOSABS 0.2 10/08/2021 1525   BASOSABS 0.0 03/21/2023 1232   BASOSABS 0.0 10/08/2021 1525    Lab Results  Component Value Date   HGBA1C 5.3 04/29/2019      1. Sciatica of right side (Primary) Would love to refer for PT however he will be undergoing surgery soon and coupled with recovery.  Will be unable to attend PT sessions Placed on Cymbalta Provided sciatica exercises for him - DULoxetine (CYMBALTA) 60 MG capsule; Take 1 capsule (60 mg total) by mouth daily. For chronic pain  Dispense: 90 capsule; Refill: 1  2. Essential hypertension Uncontrolled He is yet to take his antihypertensive dose today Will make no regimen changes today Counseled on blood pressure goal of less than 130/80, low-sodium, DASH diet, medication compliance, 150 minutes of moderate intensity exercise per week. Discussed medication compliance, adverse effects. - losartan -hydrochlorothiazide  (HYZAAR ) 100-25 MG tablet; Take 1  tablet by mouth daily.  Dispense: 90 tablet; Refill: 1  3. Depression, unspecified depression type Uncontrolled Due to underlying medical condition and recent diagnosis of prostate cancer Currently on Wellbutrin  Cymbalta added for pain which will also help with depression - DULoxetine (CYMBALTA) 60 MG capsule; Take 1 capsule (60 mg total) by mouth daily. For chronic pain  Dispense: 90 capsule; Refill: 1   Meds ordered this encounter  Medications   DULoxetine (CYMBALTA) 60 MG capsule    Sig: Take 1 capsule (60 mg total) by mouth daily. For chronic pain    Dispense:  90 capsule    Refill:  1   losartan -hydrochlorothiazide  (HYZAAR ) 100-25 MG tablet    Sig: Take 1 tablet by mouth daily.    Dispense:  90 tablet    Refill:  1    Follow-up: Return in about 6 months (around 05/09/2024) for  cancel previous appointment.       Joaquin Mulberry, MD, FAAFP. [facility] [place], [place] [phone]   11/08/2023, 3:28 PM

## 2023-11-08 NOTE — Patient Instructions (Signed)

## 2023-11-26 ENCOUNTER — Other Ambulatory Visit: Payer: Self-pay | Admitting: Critical Care Medicine

## 2023-12-14 ENCOUNTER — Encounter (HOSPITAL_COMMUNITY): Admission: RE | Admit: 2023-12-14 | Source: Ambulatory Visit

## 2023-12-21 NOTE — Progress Notes (Signed)
 Anesthesia Review:  PCP: Corrina Sabin LOV 11/08/23  Cardiologist : Madison Fountain,NP LOV 07/14/23   PPM/ ICD: Device Orders: Rep Notified:  Chest x-ray : EKG : Echo : Stress test: Cardiac Cath :   Activity level:  Sleep Study/ CPAP : Fasting Blood Sugar :      / Checks Blood Sugar -- times a day:    Blood Thinner/ Instructions /Last Dose: ASA / Instructions/ Last Dose :

## 2023-12-21 NOTE — Patient Instructions (Signed)
 SURGICAL WAITING ROOM VISITATION  Patients having surgery or a procedure may have no more than 2 support people in the waiting area - these visitors may rotate.    Children under the age of 65 must have an adult with them who is not the patient.  Visitors with respiratory illnesses are discouraged from visiting and should remain at home.  If the patient needs to stay at the hospital during part of their recovery, the visitor guidelines for inpatient rooms apply. Pre-op nurse will coordinate an appropriate time for 1 support person to accompany patient in pre-op.  This support person may not rotate.    Please refer to the [facility] website for the visitor guidelines for Inpatients (after your surgery is over and you are in a regular room).       Your procedure is scheduled on: 12/26/23    Report to [facility] Main Entrance    Report to admitting at  0515 AM   Call this number if you have problems the morning of surgery [phone]    Clear liquid diet the day before surgery.              Magnesium  Citrate- 8 ounces at 12 noon day before surgery.              Fleets enema nite before surgery    After Midnight you may have the following liquids until _ 0430_____ AM  DAY OF SURGERY  Water Non-Citrus Juices (without pulp, NO RED-Apple, White grape, White cranberry) Black Coffee (NO MILK/CREAM OR CREAMERS, sugar ok)  Clear Tea (NO MILK/CREAM OR CREAMERS, sugar ok) regular and decaf                             Plain Jell-O (NO RED)                                           Fruit ices (not with fruit pulp, NO RED)                                     Popsicles (NO RED)                                                               Sports drinks like Gatorade (NO RED)                           If you have questions, please contact your surgeon's office.   FOLLOW BOWEL PREP AND ANY ADDITIONAL PRE OP INSTRUCTIONS YOU RECEIVED FROM YOUR SURGEON'S OFFICE!!!     Oral Hygiene  is also important to reduce your risk of infection.                                    Remember - BRUSH YOUR TEETH THE MORNING OF SURGERY WITH YOUR REGULAR TOOTHPASTE  DENTURES WILL BE REMOVED PRIOR TO SURGERY PLEASE DO NOT APPLY Poly  grip OR ADHESIVES!!!   Do NOT smoke after Midnight   Stop all vitamins and herbal supplements 7 days before surgery.   Take these medicines the morning of surgery with A SIP OF WATER:  amlodipione, cymbalta , ranexa    DO NOT TAKE ANY ORAL DIABETIC MEDICATIONS DAY OF YOUR SURGERY  Bring CPAP mask and tubing day of surgery.                              You may not have any metal on your body including hair pins, jewelry, and body piercing             Do not wear make-up, lotions, powders, perfumes/cologne, or deodorant  Do not wear nail polish including gel and S&S, artificial/acrylic nails, or any other type of covering on natural nails including finger and toenails. If you have artificial nails, gel coating, etc. that needs to be removed by a nail salon please have this removed prior to surgery or surgery may need to be canceled/ delayed if the surgeon/ anesthesia feels like they are unable to be safely monitored.   Do not shave  48 hours prior to surgery.               Men may shave face and neck.   Do not bring valuables to the hospital. [place] IS NOT             RESPONSIBLE   FOR VALUABLES.   Contacts, glasses, dentures or bridgework may not be worn into surgery.   Bring small overnight bag day of surgery.   DO NOT BRING YOUR HOME MEDICATIONS TO THE HOSPITAL. PHARMACY WILL DISPENSE MEDICATIONS LISTED ON YOUR MEDICATION LIST TO YOU DURING YOUR ADMISSION IN THE HOSPITAL!    Patients discharged on the day of surgery will not be allowed to drive home.  Someone NEEDS to stay with you for the first 24 hours after anesthesia.   Special Instructions: Bring a copy of your healthcare power of attorney and living will documents the day of surgery if  you haven't scanned them before.              Please read over the following fact sheets you were given: IF YOU HAVE QUESTIONS ABOUT YOUR PRE-OP INSTRUCTIONS PLEASE CALL [phone].   If you received a COVID test during your pre-op visit  it is requested that you wear a mask when out in public, stay away from anyone that may not be feeling well and notify your surgeon if you develop symptoms. If you test positive for Covid or have been in contact with anyone that has tested positive in the last 10 days please notify you surgeon.    [facility] - Preparing for Surgery Before surgery, you can play an important role.  Because skin is not sterile, your skin needs to be as free of germs as possible.  You can reduce the number of germs on your skin by washing with CHG (chlorahexidine gluconate) soap before surgery.  CHG is an antiseptic cleaner which kills germs and bonds with the skin to continue killing germs even after washing. Please DO NOT use if you have an allergy to CHG or antibacterial soaps.  If your skin becomes reddened/irritated stop using the CHG and inform your nurse when you arrive at Short Stay. Do not shave (including legs and underarms) for at least 48 hours prior to the first CHG shower.  You  may shave your face/neck. Please follow these instructions carefully:  1.  Shower with CHG Soap the night before surgery and the  morning of Surgery.  2.  If you choose to wash your hair, wash your hair first as usual with your  normal  shampoo.  3.  After you shampoo, rinse your hair and body thoroughly to remove the  shampoo.                           4.  Use CHG as you would any other liquid soap.  You can apply chg directly  to the skin and wash                       Gently with a scrungie or clean washcloth.  5.  Apply the CHG Soap to your body ONLY FROM THE NECK DOWN.   Do not use on face/ open                           Wound or open sores. Avoid contact with eyes, ears mouth and genitals  (private parts).                       Wash face,  Genitals (private parts) with your normal soap.             6.  Wash thoroughly, paying special attention to the area where your surgery  will be performed.  7.  Thoroughly rinse your body with warm water from the neck down.  8.  DO NOT shower/wash with your normal soap after using and rinsing off  the CHG Soap.                9.  Pat yourself dry with a clean towel.            10.  Wear clean pajamas.            11.  Place clean sheets on your bed the night of your first shower and do not  sleep with pets. Day of Surgery : Do not apply any lotions/deodorants the morning of surgery.  Please wear clean clothes to the hospital/surgery center.  FAILURE TO FOLLOW THESE INSTRUCTIONS MAY RESULT IN THE CANCELLATION OF YOUR SURGERY PATIENT SIGNATURE_________________________________  NURSE SIGNATURE__________________________________  ________________________________________________________________________

## 2023-12-22 ENCOUNTER — Encounter (HOSPITAL_COMMUNITY): Payer: Self-pay

## 2023-12-22 ENCOUNTER — Encounter (HOSPITAL_COMMUNITY)
Admission: RE | Admit: 2023-12-22 | Discharge: 2023-12-22 | Disposition: A | Discharge status: Home / Self Care | Source: Ambulatory Visit | Attending: Urology | Admitting: Urology

## 2023-12-22 ENCOUNTER — Other Ambulatory Visit: Payer: Self-pay

## 2023-12-22 VITALS — BP 123/84 | HR 54 | Temp 98.4°F | Resp 16 | Ht 68.0 in | Wt 158.0 lb

## 2023-12-22 DIAGNOSIS — Z7902 Long term (current) use of antithrombotics/antiplatelets: Secondary | ICD-10-CM | POA: Diagnosis not present

## 2023-12-22 DIAGNOSIS — C61 Malignant neoplasm of prostate: Secondary | ICD-10-CM | POA: Insufficient documentation

## 2023-12-22 DIAGNOSIS — I10 Essential (primary) hypertension: Secondary | ICD-10-CM | POA: Diagnosis not present

## 2023-12-22 DIAGNOSIS — Z955 Presence of coronary angioplasty implant and graft: Secondary | ICD-10-CM | POA: Insufficient documentation

## 2023-12-22 DIAGNOSIS — Z8673 Personal history of transient ischemic attack (TIA), and cerebral infarction without residual deficits: Secondary | ICD-10-CM | POA: Diagnosis not present

## 2023-12-22 DIAGNOSIS — I252 Old myocardial infarction: Secondary | ICD-10-CM | POA: Diagnosis not present

## 2023-12-22 DIAGNOSIS — I422 Other hypertrophic cardiomyopathy: Secondary | ICD-10-CM | POA: Insufficient documentation

## 2023-12-22 DIAGNOSIS — Z01818 Encounter for other preprocedural examination: Secondary | ICD-10-CM | POA: Diagnosis not present

## 2023-12-22 DIAGNOSIS — I2510 Atherosclerotic heart disease of native coronary artery without angina pectoris: Secondary | ICD-10-CM | POA: Insufficient documentation

## 2023-12-22 DIAGNOSIS — Z01810 Encounter for preprocedural cardiovascular examination: Secondary | ICD-10-CM | POA: Diagnosis present

## 2023-12-22 DIAGNOSIS — Z01812 Encounter for preprocedural laboratory examination: Secondary | ICD-10-CM | POA: Diagnosis present

## 2023-12-22 HISTORY — DX: Peripheral vascular disease, unspecified: I73.9

## 2023-12-22 HISTORY — DX: Malignant (primary) neoplasm, unspecified: C80.1

## 2023-12-22 LAB — BASIC METABOLIC PANEL WITH GFR
Anion gap: 8 (ref 5–15)
BUN: 15 mg/dL (ref 8–23)
CO2: 28 mmol/L (ref 22–32)
Calcium: 9.7 mg/dL (ref 8.9–10.3)
Chloride: 104 mmol/L (ref 98–111)
Creatinine, Ser: 1.16 mg/dL (ref 0.61–1.24)
GFR, Estimated: >60 mL/min (ref >60)
Glucose, Bld: 94 mg/dL (ref 70–99)
Potassium: 3.7 mmol/L (ref 3.5–5.1)
Sodium: 140 mmol/L (ref 135–145)

## 2023-12-22 LAB — CBC
HCT: 36.3 % — ABNORMAL LOW (ref 39.0–52.0)
Hemoglobin: 11.6 g/dL — ABNORMAL LOW (ref 13.0–17.0)
MCH: 30.1 pg (ref 26.0–34.0)
MCHC: 32 g/dL (ref 30.0–36.0)
MCV: 94.3 fL (ref 80.0–100.0)
Platelets: 280 K/uL (ref 150–400)
RBC: 3.85 MIL/uL — ABNORMAL LOW (ref 4.22–5.81)
RDW: 12.7 % (ref 11.5–15.5)
WBC: 5.1 K/uL (ref 4.0–10.5)
nRBC: 0 % (ref 0.0–0.2)

## 2023-12-23 NOTE — Progress Notes (Signed)
 Anesthesia Chart Review  Case: [id number] Date/Time: 12/26/23 0715   Procedures:      PROSTATECTOMY, RADICAL, ROBOT-ASSISTED, LAPAROSCOPIC     LYMPHADENECTOMY, PELVIS, ROBOT-ASSISTED (Bilateral)   Anesthesia type: General   Diagnosis: Prostate cancer (HCC) [C61]   Pre-op diagnosis: PROSTATE CANCER   Location: [facility] ROOM 03 / [facility]   Surgeons: Selma Donnice SAUNDERS, MD       DISCUSSION:63 y.o. former smoker with h/o HTN, CAD s/p NSTEMI 2017 with PCI to ramus intermedius, apical variant hypertrophic cardiomyopathy (no aneurysm, maximall apical thickness 19 mm by CT in 2020), Stroke, prostate cancer scheduled for above procedure 12/26/2023 with Dr. Donnice Selma.   Pt last seen by PCP.  Per notes HTN uncontrolled, pt started on Hyzaar .  BP at PAT 123/84.   Pt last seen by cardiology 01/18/2023. Per notes NYHA 1, no consideration of apical myectomy. No indication for ICD as pt is asymptomatic.  POET to be done fall of 2025. 1 year follow up from this visit recommended.   Per cardiology note 10/25/2023, - Patient's RCRI score is 11%   The patient affirms he has been doing well without any new cardiac symptoms. They are able to achieve 7 METS without cardiac limitations. Therefore, based on ACC/AHA guidelines, the patient would be at acceptable risk for the planned procedure without further cardiovascular testing. The patient was advised that if he develops new symptoms prior to surgery to contact our office to arrange for a follow-up visit, and he verbalized understanding.    The patient was advised that if he develops new symptoms prior to surgery to contact our office to arrange for a follow-up visit, and he verbalized understanding.   Patient can hold Plavix  5 days prior to procedure and should restart postprocedure when surgically safe and hemostasis is achieved.   Last dose of Plavix  12/21/2023.  VS: BP 123/84   Pulse (!) 54   Temp 36.9 C (Oral)   Resp 16   Ht 5' 8 (1.727 m)   Wt 71.7 kg    SpO2 100%   BMI 24.02 kg/m   PROVIDERS: Delbert Clam, MD is PCP   Primary Cardiologist: Stanly DELENA Leavens, MD LABS: Labs reviewed: Acceptable for surgery. (all labs ordered are listed, but only abnormal results are displayed)  Labs Reviewed  CBC - Abnormal; Notable for the following components:      Result Value   RBC 3.85 (*)    Hemoglobin 11.6 (*)    HCT 36.3 (*)    All other components within normal limits  BASIC METABOLIC PANEL WITH GFR  TYPE AND SCREEN     IMAGES:   EKG:   CV: Echo 04/29/2019 1. Left ventricular ejection fraction, by visual estimation, is 60 to  65%. The left ventricle has normal function. Left ventricular septal wall  thickness was mildly increased. Mildly increased left ventricular  posterior wall thickness. There is no left  ventricular hypertrophy.   2. Global right ventricle has normal systolic function.The right  ventricular size is normal. No increase in right ventricular wall  thickness.   3. Left atrial size was mildly dilated.   4. Right atrial size was normal.   5. The mitral valve is normal in structure. Trace mitral valve  regurgitation. No evidence of mitral stenosis.   6. The tricuspid valve is normal in structure. Tricuspid valve  regurgitation is not demonstrated.   7. The aortic valve was not well visualized. Aortic valve regurgitation  is not visualized. No evidence of  aortic valve sclerosis or stenosis.   8. The pulmonic valve was normal in structure. Pulmonic valve  regurgitation is not visualized.   9. The inferior vena cava is normal in size with greater than 50%  respiratory variability, suggesting right atrial pressure of 3 mmHg.   Past Medical History:  Diagnosis Date   Apical variant hypertrophic cardiomyopathy (HCC)    CAD (coronary artery disease)    a. LHC in 2011 with severe dz in D1/D2 and very distal LAD--> too small for PCI and managed medically.  b.  LHC in 10/2013 after an abnormal nuclear study  w/ non-obst dz/c. 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   HOH (hard of hearing)    rgiht ear   Hyperlipidemia    Hypertension    Myocardial infarction [facility])    Peripheral vascular disease (HCC)    Stroke [facility])     Past Surgical History:  Procedure Laterality Date   CARDIAC CATHETERIZATION N/A 02/24/2016   Procedure: Left Heart Cath and Coronary Angiography;  Surgeon: Ozell Fell, MD;  Location: [facility] INVASIVE CV LAB;  Service: Cardiovascular;  Laterality: N/A;   CARDIAC CATHETERIZATION N/A 02/24/2016   Procedure: Coronary Stent Intervention;  Surgeon: Ozell Fell, MD;  Location: [facility] INVASIVE CV LAB;  Service: Cardiovascular;  Laterality: N/A;   CORONARY ANGIOPLASTY WITH STENT PLACEMENT  2017   CORONARY BALLOON ANGIOPLASTY N/A 04/30/2019   Procedure: CORONARY BALLOON ANGIOPLASTY;  Surgeon: Swaziland, Peter M, MD;  Location: [facility] INVASIVE CV LAB;  Service: Cardiovascular;  Laterality: N/A;   INGUINAL HERNIA REPAIR Left    LEFT HEART CATH AND CORONARY ANGIOGRAPHY N/A 04/30/2019   Procedure: LEFT HEART CATH AND CORONARY ANGIOGRAPHY;  Surgeon: Cherrie Toribio SAUNDERS, MD;  Location: [facility] INVASIVE CV LAB;  Service: Cardiovascular;  Laterality: N/A;   LEFT HEART CATH AND CORONARY ANGIOGRAPHY N/A 05/14/2019   Procedure: LEFT HEART CATH AND CORONARY ANGIOGRAPHY;  Surgeon: Swaziland, Peter M, MD;  Location: [facility] INVASIVE CV LAB;  Service: Cardiovascular;  Laterality: N/A;   LEFT HEART CATHETERIZATION WITH CORONARY ANGIOGRAM N/A 11/22/2013   Procedure: LEFT HEART CATHETERIZATION WITH CORONARY ANGIOGRAM;  Surgeon: Victory LELON Claudene DOUGLAS, MD;  Location: [facility]  CATH LAB;  Service: Cardiovascular;  Laterality: N/A;   TEE WITHOUT CARDIOVERSION N/A 08/22/2015   Procedure: TRANSESOPHAGEAL ECHOCARDIOGRAM (TEE);  Surgeon: Aleene JINNY Passe, MD;  Location: [facility] ENDOSCOPY;  Service: Cardiovascular;  Laterality: N/A;    MEDICATIONS:  amLODipine  (NORVASC ) 10 MG tablet   buPROPion  (WELLBUTRIN  XL) 150 MG 24 hr tablet   clopidogrel  (PLAVIX ) 75 MG tablet   DULoxetine  (CYMBALTA ) 60 MG capsule   Evolocumab  (REPATHA  SURECLICK) 140 MG/ML SOAJ   losartan -hydrochlorothiazide  (HYZAAR ) 100-25 MG tablet   nitroGLYCERIN  (NITROSTAT ) 0.4 MG SL tablet   ranolazine  (RANEXA ) 500 MG 12 hr tablet   rosuvastatin  (CRESTOR ) 40 MG tablet   terbinafine  (LAMISIL ) 250 MG tablet   No current facility-administered medications for this encounter.   Harlene Hoots Ward, PA-C [facility]-Surgical Testing [phone]

## 2023-12-23 NOTE — Anesthesia Preprocedure Evaluation (Addendum)
 Anesthesia Evaluation  Patient identified by MRN, date of birth, ID band Patient awake    Reviewed: Allergy & Precautions, H&P , NPO status , Patient's Chart, lab work & pertinent test results  Airway Mallampati: II   Neck ROM: full    Dental   Pulmonary former smoker   breath sounds clear to auscultation       Cardiovascular hypertension, + CAD, + Past MI, + Cardiac Stents and + Peripheral Vascular Disease   Rhythm:regular Rate:Normal     Neuro/Psych  Headaches PSYCHIATRIC DISORDERS  Depression    CVA    GI/Hepatic ,GERD  ,,  Endo/Other    Renal/GU      Musculoskeletal  (+) Arthritis ,    Abdominal   Peds  Hematology   Anesthesia Other Findings   Reproductive/Obstetrics                              Anesthesia Physical Anesthesia Plan  ASA: 3  Anesthesia Plan: General   Post-op Pain Management:    Induction: Intravenous  PONV Risk Score and Plan: 2 and Ondansetron , Dexamethasone , Midazolam  and Treatment may vary due to age or medical condition  Airway Management Planned: Oral ETT  Additional Equipment:   Intra-op Plan:   Post-operative Plan: Extubation in OR  Informed Consent: I have reviewed the patients History and Physical, chart, labs and discussed the procedure including the risks, benefits and alternatives for the proposed anesthesia with the patient or authorized representative who has indicated his/her understanding and acceptance.     Dental advisory given  Plan Discussed with: CRNA, Anesthesiologist and Surgeon  Anesthesia Plan Comments: (See PAT note 12/22/2023)         Anesthesia Quick Evaluation

## 2023-12-26 ENCOUNTER — Other Ambulatory Visit: Payer: Self-pay

## 2023-12-26 ENCOUNTER — Observation Stay (HOSPITAL_COMMUNITY)
Admission: RE | Admit: 2023-12-26 | Discharge: 2023-12-27 | Disposition: A | Discharge status: Home / Self Care | Attending: Urology | Admitting: Urology

## 2023-12-26 ENCOUNTER — Ambulatory Visit (HOSPITAL_COMMUNITY): Payer: Self-pay | Admitting: Physician Assistant

## 2023-12-26 ENCOUNTER — Encounter (HOSPITAL_COMMUNITY): Payer: Self-pay | Admitting: Urology

## 2023-12-26 ENCOUNTER — Encounter (HOSPITAL_COMMUNITY)
Admission: RE | Disposition: A | Discharge status: Home / Self Care | Payer: Self-pay | Source: Home / Self Care | Attending: Urology

## 2023-12-26 ENCOUNTER — Ambulatory Visit (HOSPITAL_COMMUNITY): Admitting: Anesthesiology

## 2023-12-26 DIAGNOSIS — C61 Malignant neoplasm of prostate: Principal | ICD-10-CM | POA: Insufficient documentation

## 2023-12-26 DIAGNOSIS — I10 Essential (primary) hypertension: Secondary | ICD-10-CM | POA: Diagnosis not present

## 2023-12-26 DIAGNOSIS — Z87891 Personal history of nicotine dependence: Secondary | ICD-10-CM | POA: Diagnosis not present

## 2023-12-26 DIAGNOSIS — I2510 Atherosclerotic heart disease of native coronary artery without angina pectoris: Secondary | ICD-10-CM | POA: Diagnosis not present

## 2023-12-26 DIAGNOSIS — I252 Old myocardial infarction: Secondary | ICD-10-CM | POA: Diagnosis not present

## 2023-12-26 DIAGNOSIS — Z01818 Encounter for other preprocedural examination: Secondary | ICD-10-CM

## 2023-12-26 HISTORY — PX: ROBOT ASSISTED LAPAROSCOPIC RADICAL PROSTATECTOMY: SHX5141

## 2023-12-26 LAB — TYPE AND SCREEN
ABO/RH(D): A NEG
Antibody Screen: NEGATIVE

## 2023-12-26 LAB — HEMOGLOBIN AND HEMATOCRIT, BLOOD
HCT: 33.7 % — ABNORMAL LOW (ref 39.0–52.0)
Hemoglobin: 10.6 g/dL — ABNORMAL LOW (ref 13.0–17.0)

## 2023-12-26 LAB — ABO/RH: ABO/RH(D): A NEG

## 2023-12-26 SURGERY — PROSTATECTOMY, RADICAL, ROBOT-ASSISTED, LAPAROSCOPIC
Anesthesia: General

## 2023-12-26 MED ORDER — SUCCINYLCHOLINE CHLORIDE 200 MG/10ML IV SOSY
PREFILLED_SYRINGE | INTRAVENOUS | Status: AC
  Filled 2023-12-26: qty 10

## 2023-12-26 MED ORDER — LOSARTAN POTASSIUM-HCTZ 100-25 MG PO TABS: 1.0000 | ORAL_TABLET | Freq: Every day | ORAL | Status: DC

## 2023-12-26 MED ORDER — DOCUSATE SODIUM 100 MG PO CAPS
100.0000 mg | ORAL_CAPSULE | Freq: Two times a day (BID) | ORAL | Status: DC
  Administered 2023-12-26 – 2023-12-27 (×2): 100 mg via ORAL
  Filled 2023-12-26 (×2): qty 1

## 2023-12-26 MED ORDER — PROPOFOL 1000 MG/100ML IV EMUL
INTRAVENOUS | Status: AC
  Filled 2023-12-26: qty 100

## 2023-12-26 MED ORDER — LIDOCAINE HCL (PF) 2 % IJ SOLN
INTRAMUSCULAR | Status: AC
  Filled 2023-12-26: qty 5

## 2023-12-26 MED ORDER — DEXAMETHASONE SODIUM PHOSPHATE 10 MG/ML IJ SOLN
INTRAMUSCULAR | Status: DC | PRN
  Administered 2023-12-26: 10 mg via INTRAVENOUS

## 2023-12-26 MED ORDER — HYDROMORPHONE HCL 1 MG/ML IJ SOLN: 0.5000 mg | INTRAMUSCULAR | Status: DC | PRN

## 2023-12-26 MED ORDER — RANOLAZINE ER 500 MG PO TB12
500.0000 mg | ORAL_TABLET | Freq: Two times a day (BID) | ORAL | Status: DC
  Administered 2023-12-26 – 2023-12-27 (×2): 500 mg via ORAL
  Filled 2023-12-26 (×3): qty 1

## 2023-12-26 MED ORDER — ONDANSETRON HCL 4 MG/2ML IJ SOLN
4.0000 mg | INTRAMUSCULAR | Status: DC | PRN
Start: 2023-12-26 — End: 2023-12-27

## 2023-12-26 MED ORDER — OXYCODONE HCL 5 MG PO TABS
5.0000 mg | ORAL_TABLET | ORAL | Status: DC | PRN
  Administered 2023-12-26 – 2023-12-27 (×2): 5 mg via ORAL
  Filled 2023-12-26 (×2): qty 1

## 2023-12-26 MED ORDER — LACTATED RINGERS IV SOLN: INTRAVENOUS | Status: DC

## 2023-12-26 MED ORDER — LIDOCAINE HCL (CARDIAC) PF 100 MG/5ML IV SOSY
PREFILLED_SYRINGE | INTRAVENOUS | Status: DC | PRN
  Administered 2023-12-26: 60 mg via INTRAVENOUS

## 2023-12-26 MED ORDER — HYOSCYAMINE SULFATE 0.125 MG SL SUBL
0.1250 mg | SUBLINGUAL_TABLET | SUBLINGUAL | Status: DC | PRN
  Administered 2023-12-27: 0.125 mg via SUBLINGUAL
  Filled 2023-12-26: qty 1

## 2023-12-26 MED ORDER — OXYCODONE HCL 5 MG PO TABS: 5.0000 mg | ORAL_TABLET | Freq: Once | ORAL | Status: DC | PRN

## 2023-12-26 MED ORDER — FLEET ENEMA RE ENEM: 1.0000 | ENEMA | Freq: Once | RECTAL | Status: DC

## 2023-12-26 MED ORDER — ALBUMIN HUMAN 5 % IV SOLN: INTRAVENOUS | Status: DC | PRN

## 2023-12-26 MED ORDER — DIPHENHYDRAMINE HCL 50 MG/ML IJ SOLN
INTRAMUSCULAR | Status: DC | PRN
  Administered 2023-12-26: 25 mg via INTRAVENOUS

## 2023-12-26 MED ORDER — SULFAMETHOXAZOLE-TRIMETHOPRIM 800-160 MG PO TABS: 1.0000 | ORAL_TABLET | Freq: Two times a day (BID) | ORAL | 0 refills | Status: DC

## 2023-12-26 MED ORDER — FENTANYL CITRATE (PF) 250 MCG/5ML IJ SOLN
INTRAMUSCULAR | Status: DC | PRN
  Administered 2023-12-26: 100 ug via INTRAVENOUS
  Administered 2023-12-26: 50 ug via INTRAVENOUS
  Administered 2023-12-26: 100 ug via INTRAVENOUS

## 2023-12-26 MED ORDER — ONDANSETRON HCL 4 MG/2ML IJ SOLN
INTRAMUSCULAR | Status: DC | PRN
  Administered 2023-12-26: 4 mg via INTRAVENOUS

## 2023-12-26 MED ORDER — FENTANYL CITRATE PF 50 MCG/ML IJ SOSY
PREFILLED_SYRINGE | INTRAMUSCULAR | Status: AC
  Filled 2023-12-26: qty 3

## 2023-12-26 MED ORDER — SODIUM CHLORIDE 0.9% FLUSH
INTRAVENOUS | Status: DC | PRN
Start: 2023-12-26 — End: 2023-12-26
  Administered 2023-12-26: 20 mL

## 2023-12-26 MED ORDER — ALBUMIN HUMAN 5 % IV SOLN
INTRAVENOUS | Status: AC
  Filled 2023-12-26: qty 250

## 2023-12-26 MED ORDER — SUGAMMADEX SODIUM 200 MG/2ML IV SOLN
INTRAVENOUS | Status: AC
  Filled 2023-12-26: qty 2

## 2023-12-26 MED ORDER — BUPIVACAINE LIPOSOME 1.3 % IJ SUSP
INTRAMUSCULAR | Status: AC
  Filled 2023-12-26: qty 20

## 2023-12-26 MED ORDER — ROSUVASTATIN CALCIUM 20 MG PO TABS
40.0000 mg | ORAL_TABLET | Freq: Every day | ORAL | Status: DC
  Administered 2023-12-26 – 2023-12-27 (×2): 40 mg via ORAL
  Filled 2023-12-26 (×2): qty 2

## 2023-12-26 MED ORDER — ROCURONIUM BROMIDE 10 MG/ML (PF) SYRINGE
PREFILLED_SYRINGE | INTRAVENOUS | Status: AC
  Filled 2023-12-26: qty 10

## 2023-12-26 MED ORDER — SODIUM CHLORIDE (PF) 0.9 % IJ SOLN
INTRAMUSCULAR | Status: AC
  Filled 2023-12-26: qty 20

## 2023-12-26 MED ORDER — FENTANYL CITRATE PF 50 MCG/ML IJ SOSY
25.0000 ug | PREFILLED_SYRINGE | INTRAMUSCULAR | Status: DC | PRN
  Administered 2023-12-26: 50 ug via INTRAVENOUS

## 2023-12-26 MED ORDER — BUPIVACAINE LIPOSOME 1.3 % IJ SUSP
INTRAMUSCULAR | Status: DC | PRN
  Administered 2023-12-26: 20 mL

## 2023-12-26 MED ORDER — DIPHENHYDRAMINE HCL 12.5 MG/5ML PO ELIX: 12.5000 mg | ORAL_SOLUTION | Freq: Four times a day (QID) | ORAL | Status: DC | PRN

## 2023-12-26 MED ORDER — OXYCODONE HCL 5 MG/5ML PO SOLN: 5.0000 mg | Freq: Once | ORAL | Status: DC | PRN

## 2023-12-26 MED ORDER — DEXMEDETOMIDINE HCL IN NACL 80 MCG/20ML IV SOLN
INTRAVENOUS | Status: AC
  Filled 2023-12-26: qty 20

## 2023-12-26 MED ORDER — TRIPLE ANTIBIOTIC 3.5-400-5000 EX OINT: 1.0000 | TOPICAL_OINTMENT | Freq: Three times a day (TID) | CUTANEOUS | Status: DC | PRN

## 2023-12-26 MED ORDER — CEFAZOLIN SODIUM-DEXTROSE 2-4 GM/100ML-% IV SOLN
INTRAVENOUS | Status: AC
  Filled 2023-12-26: qty 100

## 2023-12-26 MED ORDER — ONDANSETRON HCL 4 MG/2ML IJ SOLN: 4.0000 mg | Freq: Four times a day (QID) | INTRAMUSCULAR | Status: DC | PRN

## 2023-12-26 MED ORDER — TRANEXAMIC ACID-NACL 1000-0.7 MG/100ML-% IV SOLN
INTRAVENOUS | Status: DC | PRN
  Administered 2023-12-26: 1000 mg via INTRAVENOUS

## 2023-12-26 MED ORDER — SODIUM CHLORIDE 0.9 % IV BOLUS
1000.0000 mL | Freq: Once | INTRAVENOUS | Status: AC
  Administered 2023-12-26: 1000 mL via INTRAVENOUS

## 2023-12-26 MED ORDER — MIDAZOLAM HCL 2 MG/2ML IJ SOLN
INTRAMUSCULAR | Status: AC
  Filled 2023-12-26: qty 2

## 2023-12-26 MED ORDER — TRANEXAMIC ACID-NACL 1000-0.7 MG/100ML-% IV SOLN
INTRAVENOUS | Status: AC
  Filled 2023-12-26: qty 100

## 2023-12-26 MED ORDER — CHLORHEXIDINE GLUCONATE CLOTH 2 % EX PADS
6.0000 | MEDICATED_PAD | Freq: Every day | CUTANEOUS | Status: DC
  Administered 2023-12-27: 6 via TOPICAL

## 2023-12-26 MED ORDER — DULOXETINE HCL 60 MG PO CPEP
60.0000 mg | ORAL_CAPSULE | Freq: Every day | ORAL | Status: DC
  Administered 2023-12-26 – 2023-12-27 (×2): 60 mg via ORAL
  Filled 2023-12-26 (×2): qty 1

## 2023-12-26 MED ORDER — ONDANSETRON HCL 4 MG/2ML IJ SOLN
INTRAMUSCULAR | Status: AC
  Filled 2023-12-26: qty 2

## 2023-12-26 MED ORDER — ACETAMINOPHEN 500 MG PO TABS
1000.0000 mg | ORAL_TABLET | Freq: Four times a day (QID) | ORAL | Status: AC
  Administered 2023-12-26 – 2023-12-27 (×4): 1000 mg via ORAL
  Filled 2023-12-26 (×4): qty 2

## 2023-12-26 MED ORDER — DIPHENHYDRAMINE HCL 50 MG/ML IJ SOLN: 12.5000 mg | Freq: Four times a day (QID) | INTRAMUSCULAR | Status: DC | PRN

## 2023-12-26 MED ORDER — DEXAMETHASONE SODIUM PHOSPHATE 10 MG/ML IJ SOLN
INTRAMUSCULAR | Status: AC
  Filled 2023-12-26: qty 1

## 2023-12-26 MED ORDER — HYDROMORPHONE HCL 1 MG/ML IJ SOLN
INTRAMUSCULAR | Status: DC | PRN
  Administered 2023-12-26 (×4): .5 mg via INTRAVENOUS

## 2023-12-26 MED ORDER — LACTATED RINGERS IV SOLN: INTRAVENOUS | Status: DC | PRN

## 2023-12-26 MED ORDER — SODIUM CHLORIDE (PF) 0.9 % IJ SOLN
INTRAMUSCULAR | Status: AC
  Filled 2023-12-26: qty 10

## 2023-12-26 MED ORDER — MIDAZOLAM HCL 5 MG/5ML IJ SOLN
INTRAMUSCULAR | Status: DC | PRN
  Administered 2023-12-26: 2 mg via INTRAVENOUS

## 2023-12-26 MED ORDER — PROPOFOL 10 MG/ML IV BOLUS
INTRAVENOUS | Status: DC | PRN
  Administered 2023-12-26: 150 mg via INTRAVENOUS

## 2023-12-26 MED ORDER — SODIUM CHLORIDE 0.45 % IV SOLN: INTRAVENOUS | Status: AC

## 2023-12-26 MED ORDER — SUGAMMADEX SODIUM 200 MG/2ML IV SOLN
INTRAVENOUS | Status: DC | PRN
Start: 2023-12-26 — End: 2023-12-26
  Administered 2023-12-26: 200 mg via INTRAVENOUS

## 2023-12-26 MED ORDER — CEFAZOLIN SODIUM-DEXTROSE 2-4 GM/100ML-% IV SOLN
2.0000 g | INTRAVENOUS | Status: AC
  Administered 2023-12-26 (×2): 2 g via INTRAVENOUS
  Filled 2023-12-26: qty 100

## 2023-12-26 MED ORDER — FENTANYL CITRATE (PF) 250 MCG/5ML IJ SOLN
INTRAMUSCULAR | Status: AC
  Filled 2023-12-26: qty 5

## 2023-12-26 MED ORDER — DIPHENHYDRAMINE HCL 50 MG/ML IJ SOLN
INTRAMUSCULAR | Status: AC
  Filled 2023-12-26: qty 1

## 2023-12-26 MED ORDER — PROPOFOL 500 MG/50ML IV EMUL
INTRAVENOUS | Status: AC
  Filled 2023-12-26: qty 50

## 2023-12-26 MED ORDER — LACTATED RINGERS IR SOLN
Status: DC | PRN
  Administered 2023-12-26: 1000 mL

## 2023-12-26 MED ORDER — AMLODIPINE BESYLATE 10 MG PO TABS
10.0000 mg | ORAL_TABLET | Freq: Every day | ORAL | Status: DC
Start: 2023-12-26 — End: 2023-12-27
  Administered 2023-12-26 – 2023-12-27 (×2): 10 mg via ORAL
  Filled 2023-12-26 (×2): qty 1

## 2023-12-26 MED ORDER — HEMOSTATIC AGENTS (NO CHARGE) OPTIME
TOPICAL | Status: DC | PRN
Start: 2023-12-26 — End: 2023-12-26
  Administered 2023-12-26: 1 via TOPICAL

## 2023-12-26 MED ORDER — LOSARTAN POTASSIUM 50 MG PO TABS
100.0000 mg | ORAL_TABLET | Freq: Every day | ORAL | Status: DC
  Administered 2023-12-26 – 2023-12-27 (×2): 100 mg via ORAL
  Filled 2023-12-26 (×2): qty 2

## 2023-12-26 MED ORDER — HYDROCODONE-ACETAMINOPHEN 5-325 MG PO TABS: 1.0000 | ORAL_TABLET | Freq: Four times a day (QID) | ORAL | 0 refills | Status: AC | PRN

## 2023-12-26 MED ORDER — MAGNESIUM CITRATE PO SOLN: 1.0000 | Freq: Once | ORAL | Status: DC

## 2023-12-26 MED ORDER — CHLORHEXIDINE GLUCONATE 0.12 % MT SOLN
15.0000 mL | Freq: Once | OROMUCOSAL | Status: AC
  Administered 2023-12-26: 15 mL via OROMUCOSAL

## 2023-12-26 MED ORDER — HYDROMORPHONE HCL 2 MG/ML IJ SOLN
INTRAMUSCULAR | Status: AC
  Filled 2023-12-26: qty 1

## 2023-12-26 MED ORDER — PROPOFOL 10 MG/ML IV BOLUS
INTRAVENOUS | Status: AC
  Filled 2023-12-26: qty 20

## 2023-12-26 MED ORDER — PROPOFOL 500 MG/50ML IV EMUL
INTRAVENOUS | Status: DC | PRN
  Administered 2023-12-26: 85 ug/kg/min via INTRAVENOUS

## 2023-12-26 MED ORDER — ORAL CARE MOUTH RINSE: 15.0000 mL | Freq: Once | OROMUCOSAL | Status: AC

## 2023-12-26 MED ORDER — HYDROCHLOROTHIAZIDE 25 MG PO TABS
25.0000 mg | ORAL_TABLET | Freq: Every day | ORAL | Status: DC
  Administered 2023-12-26 – 2023-12-27 (×2): 25 mg via ORAL
  Filled 2023-12-26 (×2): qty 1

## 2023-12-26 MED ORDER — ROCURONIUM BROMIDE 100 MG/10ML IV SOLN
INTRAVENOUS | Status: DC | PRN
  Administered 2023-12-26 (×3): 20 mg via INTRAVENOUS
  Administered 2023-12-26: 50 mg via INTRAVENOUS
  Administered 2023-12-26: 10 mg via INTRAVENOUS

## 2023-12-26 MED ORDER — DOCUSATE SODIUM 100 MG PO CAPS: 100.0000 mg | ORAL_CAPSULE | Freq: Two times a day (BID) | ORAL | Status: AC

## 2023-12-26 SURGICAL SUPPLY — 58 items
APPLICATOR COTTON TIP 6 STRL (MISCELLANEOUS) ×2 IMPLANT
APPLICATOR SURGIFLO ENDO (HEMOSTASIS) IMPLANT
BAG COUNTER SPONGE SURGICOUNT (BAG) IMPLANT
CATH FOLEY 2WAY SLVR 5CC 18FR (CATHETERS) ×2 IMPLANT
CATH ROBINSON RED A/P 16FR (CATHETERS) ×2 IMPLANT
CATH SILICONE 5CC 18FR (INSTRUMENTS) ×2 IMPLANT
CHLORAPREP W/TINT 26 (MISCELLANEOUS) ×2 IMPLANT
CLIP LIGATING HEM O LOK PURPLE (MISCELLANEOUS) ×2 IMPLANT
COVER SURGICAL LIGHT HANDLE (MISCELLANEOUS) ×2 IMPLANT
COVER TIP SHEARS 8 DVNC (MISCELLANEOUS) ×2 IMPLANT
CUTTER ECHEON FLEX ENDO 45 340 (ENDOMECHANICALS) IMPLANT
DERMABOND ADVANCED .7 DNX12 (GAUZE/BANDAGES/DRESSINGS) ×2 IMPLANT
DRAPE ARM DVNC X/XI (DISPOSABLE) ×8 IMPLANT
DRAPE COLUMN DVNC XI (DISPOSABLE) ×2 IMPLANT
DRAPE SURG IRRIG POUCH 19X23 (DRAPES) ×2 IMPLANT
DRIVER NDL LRG 8 DVNC XI (INSTRUMENTS) ×4 IMPLANT
DRIVER NDLE LRG 8 DVNC XI (INSTRUMENTS) ×4 IMPLANT
DRSG TEGADERM 4X4.75 (GAUZE/BANDAGES/DRESSINGS) IMPLANT
ELECT PENCIL ROCKER SW 15FT (MISCELLANEOUS) ×2 IMPLANT
ELECT REM PT RETURN 15FT ADLT (MISCELLANEOUS) ×2 IMPLANT
FORCEPS BPLR 8 MD DVNC XI (FORCEP) ×2 IMPLANT
FORCEPS BPLR FENES DVNC XI (FORCEP) ×2 IMPLANT
FORCEPS PROGRASP DVNC XI (FORCEP) ×2 IMPLANT
GAUZE 4X4 16PLY ~~LOC~~+RFID DBL (SPONGE) IMPLANT
GAUZE SPONGE 4X4 12PLY STRL (GAUZE/BANDAGES/DRESSINGS) ×2 IMPLANT
GLOVE BIO SURGEON STRL SZ 6.5 (GLOVE) ×2 IMPLANT
GLOVE BIOGEL M 7.0 STRL (GLOVE) ×4 IMPLANT
GLOVE BIOGEL PI IND STRL 7.5 (GLOVE) ×4 IMPLANT
GOWN STRL REUS W/ TWL XL LVL3 (GOWN DISPOSABLE) ×4 IMPLANT
GOWN STRL SURGICAL XL XLNG (GOWN DISPOSABLE) ×2 IMPLANT
HOLDER FOLEY CATH W/STRAP (MISCELLANEOUS) ×2 IMPLANT
IRRIGATION SUCT STRKRFLW 2 WTP (MISCELLANEOUS) ×2 IMPLANT
IV LACTATED RINGERS 1000ML (IV SOLUTION) ×2 IMPLANT
KIT TURNOVER KIT A (KITS) ×2 IMPLANT
MARKER SKIN DUAL TIP RULER LAB (MISCELLANEOUS) ×2 IMPLANT
NDL INSUFFLATION 14GA 120MM (NEEDLE) ×2 IMPLANT
NEEDLE INSUFFLATION 14GA 120MM (NEEDLE) ×2 IMPLANT
PACK ROBOT UROLOGY CUSTOM (CUSTOM PROCEDURE TRAY) ×2 IMPLANT
PLUG CATH AND CAP STRL 200 (CATHETERS) IMPLANT
PROTECTOR NERVE ULNAR (MISCELLANEOUS) ×2 IMPLANT
RELOAD STAPLE 45 4.1 GRN THCK (STAPLE) IMPLANT
SCISSORS MNPLR CVD DVNC XI (INSTRUMENTS) ×2 IMPLANT
SEAL UNIV 5-12 XI (MISCELLANEOUS) ×8 IMPLANT
SET TUBE SMOKE EVAC HIGH FLOW (TUBING) ×2 IMPLANT
SOL PREP POV-IOD 4OZ 10% (MISCELLANEOUS) ×2 IMPLANT
SOLUTION ELECTROSURG ANTI STCK (MISCELLANEOUS) ×2 IMPLANT
SURGIFLO W/THROMBIN 8M KIT (HEMOSTASIS) IMPLANT
SUT ETHILON 2 0 PS N (SUTURE) IMPLANT
SUT MNCRL 3 0 VIOLET RB1 (SUTURE) IMPLANT
SUT MNCRL AB 4-0 PS2 18 (SUTURE) ×4 IMPLANT
SUT PDS AB 0 CT1 36 (SUTURE) ×4 IMPLANT
SUT VIC AB 0 CT1 27XBRD ANTBC (SUTURE) ×2 IMPLANT
SUT VIC AB 2-0 SH 27XBRD (SUTURE) ×2 IMPLANT
SUT VIC AB 3-0 SH 27X BRD (SUTURE) IMPLANT
SUT VIC AB 4-0 RB1 27XBRD (SUTURE) IMPLANT
SUT VLOC 3-0 9IN GRN (SUTURE) IMPLANT
SUTURE VLOC BRB 180 ABS3/0GR12 (SUTURE) ×4 IMPLANT
WATER STERILE IRR 1000ML POUR (IV SOLUTION) ×2 IMPLANT

## 2023-12-26 NOTE — H&P (Signed)
 Urology Preoperative H&P   Chief Complaint: Prostate cancer  History of Present Illness: [name] is a 63 y.o. male with prostate cancer here for RALP. Denies fevers, chills, dysuria.    Past Medical History:  Diagnosis Date   Apical variant hypertrophic cardiomyopathy (HCC)    CAD (coronary artery disease)    a. LHC in 2011 with severe dz in D1/D2 and very distal LAD--> too small for PCI and managed medically.  b.  LHC in 10/2013 after an abnormal nuclear study w/ non-obst dz/c. 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   HOH (hard of hearing)    rgiht ear   Hyperlipidemia    Hypertension    Myocardial infarction [facility])    Peripheral vascular disease (HCC)    Stroke [facility])     Past Surgical History:  Procedure Laterality Date   CARDIAC CATHETERIZATION N/A 02/24/2016   Procedure: Left Heart Cath and Coronary Angiography;  Surgeon: Ozell Fell, MD;  Location: [facility] INVASIVE CV LAB;  Service: Cardiovascular;  Laterality: N/A;   CARDIAC CATHETERIZATION N/A 02/24/2016   Procedure: Coronary Stent Intervention;  Surgeon: Ozell Fell, MD;  Location: [facility] INVASIVE CV LAB;  Service: Cardiovascular;  Laterality: N/A;   CORONARY ANGIOPLASTY WITH STENT PLACEMENT  2017   CORONARY BALLOON ANGIOPLASTY N/A 04/30/2019   Procedure: CORONARY BALLOON ANGIOPLASTY;  Surgeon: Swaziland, Peter M, MD;  Location: [facility] INVASIVE CV LAB;  Service: Cardiovascular;  Laterality: N/A;   INGUINAL HERNIA REPAIR Left    LEFT HEART CATH AND CORONARY ANGIOGRAPHY N/A 04/30/2019   Procedure: LEFT HEART CATH AND CORONARY ANGIOGRAPHY;  Surgeon: Cherrie Toribio SAUNDERS, MD;  Location: [facility] INVASIVE CV LAB;  Service: Cardiovascular;  Laterality: N/A;   LEFT HEART CATH AND CORONARY ANGIOGRAPHY N/A 05/14/2019   Procedure: LEFT HEART CATH AND CORONARY ANGIOGRAPHY;  Surgeon: Swaziland, Peter M, MD;  Location: [facility] INVASIVE CV LAB;  Service: Cardiovascular;  Laterality: N/A;   LEFT HEART CATHETERIZATION WITH CORONARY ANGIOGRAM N/A 11/22/2013   Procedure: LEFT HEART CATHETERIZATION WITH CORONARY ANGIOGRAM;  Surgeon: Victory LELON Claudene DOUGLAS, MD;  Location: [facility] CATH LAB;  Service: Cardiovascular;  Laterality: N/A;   TEE WITHOUT CARDIOVERSION N/A 08/22/2015   Procedure: TRANSESOPHAGEAL ECHOCARDIOGRAM (TEE);  Surgeon: Aleene JINNY Passe, MD;  Location: [facility] ENDOSCOPY;  Service: Cardiovascular;  Laterality: N/A;    Allergies:  Allergies  Allergen Reactions   Lisinopril Swelling    lips swelling    Family History  Problem Relation Age of Onset   Hypertension Mother    Heart failure Mother    Hypertension Father    Colon cancer Father 67   Hypertension Brother    Heart disease Son    Heart disease Son    Colon cancer Paternal Uncle     Social History:  reports that he has quit smoking. His smoking use included cigarettes. He has never used smokeless tobacco. He reports that he does not drink alcohol and does not use drugs.  ROS: A complete review of systems was performed.  All systems are negative except for pertinent findings as noted.  Physical Exam:  Vital signs in last 24 hours: Temp:  [98.3 F (36.8 C)] 98.3 F (36.8 C) (07/28 0557) Pulse Rate:  [52] 52 (07/28 0557) Resp:  [16] 16 (07/28 0557) BP: (136)/(82) 136/82 (07/28 0557) SpO2:  [99 %] 99 % (07/28 0557) Weight:  [71 kg] 71 kg (07/28 0552) Constitutional:  Alert and oriented, No acute distress Cardiovascular: Regular rate and rhythm Respiratory: Normal respiratory effort, Lungs clear bilaterally GI: Abdomen is soft, nontender, nondistended, no abdominal masses GU: No CVA tenderness Lymphatic: No  lymphadenopathy Neurologic: Grossly intact, no focal deficits Psychiatric: Normal mood and affect  Laboratory Data:  No results for input(s): WBC, HGB, HCT, PLT in the last 72 hours.  No results for input(s): NA, K, CL, GLUCOSE, BUN, CALCIUM , CREATININE in the last 72 hours.  Invalid input(s): CO3   No results found for this or any previous visit (from the past 24 hours). No results found for this or any previous visit (from the past 240 hours).  Renal Function: Recent Labs    12/22/23 1129  CREATININE 1.16   Estimated Creatinine Clearance: 63.9 mL/min (by C-G formula based on SCr of 1.16 mg/dL).  Radiologic Imaging: No results found.  I independently reviewed the above imaging studies.  Assessment and Plan [name] is a 63 y.o. male with prostate cancer here for RALP with b/l PLND.  Matt R. Kassy Mcenroe MD 12/26/2023, 7:16 AM  [facility] Pager: [phone]): [phone]

## 2023-12-26 NOTE — Anesthesia Postprocedure Evaluation (Signed)
 Anesthesia Post Note  Patient: [name]  Procedure(s) Performed: PROSTATECTOMY, RADICAL, ROBOT-ASSISTED, LAPAROSCOPIC LYMPHADENECTOMY, PELVIS, ROBOT-ASSISTED (Bilateral)     Patient location during evaluation: PACU Anesthesia Type: General Level of consciousness: awake and alert Pain management: pain level controlled Vital Signs Assessment: post-procedure vital signs reviewed and stable Respiratory status: spontaneous breathing, nonlabored ventilation, respiratory function stable and patient connected to nasal cannula oxygen Cardiovascular status: blood pressure returned to baseline and stable Postop Assessment: no apparent nausea or vomiting Anesthetic complications: no   No notable events documented.  Last Vitals:  Vitals:   12/26/23 1455 12/26/23 1500  BP:  (!) 164/95  Pulse: 65 69  Resp: 13 14  Temp:    SpO2: 100% 98%    Last Pain:  Vitals:   12/26/23 1500  TempSrc:   PainSc: (P) Asleep                 Kenli Waldo S

## 2023-12-26 NOTE — Discharge Instructions (Addendum)
 Activity:  You are encouraged to ambulate frequently (about every hour during waking hours) to help prevent blood clots from forming in your legs or lungs.  However, you should not engage in any heavy lifting (> 10-15 lbs), strenuous activity, or straining. Diet: You should continue a clear liquid diet until passing gas from below.  Once this occurs, you may advance your diet to a soft diet that would be easy to digest (i.e soups, scrambled eggs, mashed potatoes, etc.) for 24 hours just as you would if getting over a bad stomach flu.  If tolerating this diet well for 24 hours, you may then begin eating regular food.  It will be normal to have some amount of bloating, nausea, and abdominal discomfort intermittently. Prescriptions:  You will be provided a prescription for pain medication to take as needed.  If your pain is not severe enough to require the prescription pain medication, you may take Tylenol  instead.  You should also take an over the counter stool softener (Colace 100 mg twice daily) to avoid straining with bowel movements as the pain medication may constipate you. Finally, you will also be provided a prescription for an antibiotic to begin the day prior to your return visit in the office for catheter removal. Catheter care: You will be taught how to take care of the catheter by the nursing staff prior to discharge from the hospital.  You may use both a leg bag and the larger bedside bag but it is recommended to at least use the bigger bedside bag at nighttime as the leg bag is small and will fill up overnight and also does not drain as well when lying flat. You may periodically feel a strong urge to void with the catheter in place.  This is a bladder spasm and most often can occur when having a bowel movement or when you are moving around. It is typically self-limited and usually will stop after a few minutes.  You may use some Vaseline or Neosporin around the tip of the catheter to reduce friction  at the tip of the penis. Incisions: You may remove your dressing bandages the 2nd day after surgery.  You most likely will have a few small staples in each of the incisions and once the bandages are removed, the incisions may stay open to air.  You may start showering (not soaking or bathing in water) 48 hours after surgery and the incisions simply need to be patted dry after the shower.  No additional care is needed. What to call us  about: You should call the office [phone]) if you develop fever > 101, persistent vomiting, or the catheter stops draining. Also, feel free to call with any other questions you may have and remember the handout that was provided to you as a reference preoperatively which answers many of the common questions that arise after surgery. You may resume aspirin , advil , aleve , vitamins, and supplements 7 days after surgery. You may restart Plavix  3 days after surgery.

## 2023-12-26 NOTE — Op Note (Signed)
 Operative Note  Preoperative diagnosis:  1.  Localized prostate cancer  Postoperative diagnosis: 1.  Localized prostate cancer  Procedure(s): 1.  Robotic assisted laparoscopic radical prostatectomy (bilateral nerve sparing) 2.  Robotic assisted laparoscopic bilateral pelvic lymph node dissection  Surgeon: Donnice Siad, MD  Assistants:   Alan Hammonds, PA  An assistant was required for this surgical procedure.  The duties of the assistant included but were not limited to suctioning, passing suture, camera manipulation, retraction.  This procedure would not be able to be performed without an Geophysicist/field seismologist.   Resident: Jacqulyn Bound, PGY-4  Anesthesia:  General  Complications:  None  EBL:   Specimens: 1.  Prostate with seminal vesicles 2.  Periprostatic fat 3. Bilateral pelvic lymph nodes  Drains/Catheters: 1.  18 French Foley catheter  Intraoperative findings:   Approximately 40cc prostate.  No accessory pudendal vessels.  Successful bilateral nerve spare.  Excellent hemostasis.  Water-tight anastomosis without leak.  Indication:  [name] is a 63 y.o. malewho initially presented with an elevated PSA.  Prostate biopsy showed Gleason 3+4 prostate cancer involving the bilateral sides of the prostate.  Treatment options were discussed with him at length and he chose robotic assisted laparoscopic radical prostatectomy. The plan is for bilateral nerve sparing.  Bilateral pelvic lymph node dissection was planned due to his risk stratification.  Description of procedure: The indications, alternatives, benefits, and risks were discussed with the patient and informed symptoms obtained.  The patient was brought to the operating room table, positioned supine and secured to the bed with a safety strap.  All pressure points were carefully padded and pneumatic compression devices were placed on lower extremities.  After the administration of intravenous antibiotics and general  endotracheal anesthesia, the patient was repositioned in the dorsal lithotomy position using well-padded Allen stirrups.  The arms were carefully tucked at the patient's side and secured with padding.  The chest was secured in place with foam padding and cloth tape and the table was positioned in approximately 30 degree Trendelenburg.  A rectal exam showed the prostate to be 40cc, without nodules and not fixed. The patient's abdomen, genitalia, and upper thighs were prepped and draped in the standard sterile manner.  A time was completed, verifying the correct patient, surgical procedure and positioning prior to beginning the procedure.  An 1 French urethral catheter was inserted to drain the bladder.  Pneumoperitoneum was introduced by placing a Veress needle into the abdomen superior to the umbilicus and insufflated with CO2 to a pressure of 15 mmHg. An 8 mm blunt tip trocar was placed just above the umbilicus.  The 0 degree camera was then passed under direct visualization.  The abdominal cavity was examined for any sign of injury, adhesions, and identification of anatomic landmarks.  The remainder of the trochars were placed which included 2 separate 8 millimeter robotic trochars which were placed 9 cm laterally and inferiorly to the initially placed camera trocar.  A 12 mm trocar was placed 8 mm lateral to the right robotic trocar.  A separate 8 mm robotic trocar was placed 8 cm lateral to the previously placed left robotic trocar on the left side.  A 5 mm trocar was placed to the right and well above the umbilicus which approximately 10 and 12 cm away from the right-sided trochars.  The robot was then docked.  I placed monopolar scissors in the right hand, a fenestrated bipolar in the left hand and a prograsp in the fourth arm.  The  urachus and median umbilical ligament was divided and we developed the space of Retzius down to the pubic bone.  I divided the parietal peritoneum laterally up to the vas  deferens on each side.  Using the prograsp forcep to provide cranial traction on the urachus, the prostate was then defatted above the prostatic vesicle junction, and the superficial dorsal venous complex was coagulated with bipolar and divided.  We submitted the periprostatic fat for pathological specimen.  The endopelvic fascia was sharply opened bilaterally and the levator muscle fibers were swept posterior laterally allowing for visualization of the deep dorsal venous complex and apex of the prostate.  The puboprostatic ligaments were sharply divided and care was taken to preserve the dorsal venous complex.  I secured the dorsal venous complex with a battery operated stapler.  I then addressed the bladder neck with a 0 degree down lens. I identified the bladder neck by pulling a Foley catheter.  The fourth arm was applying cranial traction on the urachus.  I divided the anterior bladder neck musculature until I found the anterior bladder neck mucosa which was then incised. I identified the Foley catheter within, the balloon was deflated, we pulled the Foley catheter out into the operating field.  The assistant then used a grasper to apply traction to the Foley catheter and the surgical tech then placed a Fortuna on the catheter near the penis for optimal retraction. At this point, there did appear to be a small amount of benign appearing prostate tissue at the bladder neck. This was excised and sent for frozen that revealed a small amount of prostate that was benign. A further resection was sent for permanent and a final frozen bladder neck margin was submitted with no evidence of cancer.  I then divided the lateral bladder neck mucosa in the posterior bladder neck mucosa.  I was well away from the bilateral ureteral orifices.  I divided the posterior bladder neck musculature until I discovered the longitudinal fibers and kept dissecting until I found the vas deferens. Of note, this dissection plane was quite  difficult however I was ultimately able to reach the vas deferens. The bilateral vas deferens were then freed and divided.  I then freed the bilateral seminal vesicles using blunt and sharp dissection. Of note the seminal vesicles were stuck densely to the surround structure and these were also excised. I avoided cautery around the seminal vesicles.  I divided the Denonvilliers fascia beneath the prostate and developed the prostate off the rectum.  This plane was bluntly developed towards the apex of the prostate.  I then addressed the pedicles, first starting with the right and then moving towards the left. I then did a bilateral nerve spare by dividing the lateral pelvic fascia off of the prostatic capsule laterally.  I then isolated the pedicles of the prostate and placed Weck clips on the pedicles and then divided the pedicles with cold scissors.  I continued to divide the neurovascular bundles off of the prostate out to the apex of the prostate.  At this point the prostate was essentially freed up except for the urethra.   I then addressed the prostate anteriorly, dividing the dorsal vein with cautery.  The anterior urethra was then sharply divided with cold scissors.  The Foley catheter was then pulled back and we divided the posterior urethral wall. Patent venous sinuses were then oversewn with a 4-0 Vicryl suture.  The specimen was then placed in a Endo Catch bag and then the  bag was placed in the upper abdomen out of the way.  I then irrigated the pelvis.  We performed a rectal test by instilling air into the rectal Foley.  The test was negative.  There is no concern for rectal injury.  I then over sewed a few bleeders alongside the pedicles with a 4-0 Vicryl suture on an RB1 needle.  I then performed a bilateral pelvic lymph node dissection by incising the fascia overlying the right external iliac vein, dissecting distally.  I went just distal to the node of Cloquet were replaced clips and then  divided the lymphatics.  The lateral aspect of the dissection was the pelvic sidewall, inferior was the obturator nerve and proximal of the hypogastric vessels.  I placed clips at the proximal aspect and then divided the lymphatics.  The specimen was removed with the scope grasper and sent to pathology.  Grossly, there were no enlarged lymph nodes.  This was performed on both the right and left pelvic lymph nodes.  With good hemostasis confirmed, I then did the posterior reconstruction with a Rocco stitch.  I used a 3-0 Vloc double-armed suture on an RB1 needle.  I passed the sutures through the cut edges of Denonvilliers fascia beneath the bladder on the right side and through the posterior serrated sphincter underneath the urethra.  I ran this from right to left.  And then took the other end of the suture passing just proximal to the posterior bladder neck in the midline into the posterior serrated sphincter and around this from right to left using 3 throws and reapproximated the sutures.  I then completed the urethrovesical anastomosis using a 3-0 Vloc suture on an RB1 needle.  I passed both ends of the suture from the outside in through the bladder neck at the 6 o'clock position.  I passed both through the urethral stump from the inside out and the corresponding position.  I reapproximated the bladder neck to the urethra.  I then ran the left suture on the left side anastomosis to the 9 o'clock position.  And then went back to the right-sided suture around that up the right side of the 12 o'clock position.  I then continued the left suture to the 12 o'clock position. I identified the ureteral orifices and ensured that these were not incorporated with the sutures.  I then placed a new 18 French Foley catheter into the bladder and filled it with 10 cc of sterile water.  I then secured the knot and then passed the suture behind the pubic bone for anterior suspension.  The bladder was irrigated with 200 cc of  water.  There was no leak.  Hemostasis was excellent.  A 15 French drain was placed through the previously placed fourth arm.  We did place a Carter-Thomason 0 vicryl suture through the 12 mm assistant port. The robot was undocked and all the trochars were removed under direct vision.    I enlarged the umbilical trocar site large enough to remove the prostate and closed the fascia with 0 PDS sutures in a running fashion.  All the port sites were irrigated.  Exparel  was injected to the trocar sites.  The skin was closed with 4-0 Monocryl in a running subcuticular fashion.  Skin glue was applied.  At this point, the patient was extubated and awakened in the operating room and taken to recovery room in stable condition.  There were no immediate complications.  All counts were correct.  Plan: Admit for  observation overnight.  Clear liquids tonight.  Regular for breakfast tomorrow.  Anticipate discharge home tomorrow.  Matt R. Dayln Tugwell MD [facility]  Pager: [phone]

## 2023-12-26 NOTE — Anesthesia Procedure Notes (Signed)
 Procedure Name: Intubation Date/Time: 12/26/2023 7:39 AM  Performed by: Landy Chip HERO, CRNAPre-anesthesia Checklist: Patient identified, Emergency Drugs available, Suction available and Patient being monitored Patient Re-evaluated:Patient Re-evaluated prior to induction Oxygen Delivery Method: Circle System Utilized Preoxygenation: Pre-oxygenation with 100% oxygen Induction Type: IV induction Ventilation: Mask ventilation without difficulty Laryngoscope Size: Mac and 4 Grade View: Grade II Tube type: Oral Tube size: 7.5 mm Number of attempts: 1 Airway Equipment and Method: Stylet and Oral airway Placement Confirmation: ETT inserted through vocal cords under direct vision, positive ETCO2 and breath sounds checked- equal and bilateral Secured at: 22 cm Tube secured with: Tape Dental Injury: Teeth and Oropharynx as per pre-operative assessment

## 2023-12-26 NOTE — Plan of Care (Signed)
  Problem: Education: Goal: Knowledge of the procedure and recovery process will improve Outcome: Progressing   Problem: Pain Management: Goal: General experience of comfort will improve Outcome: Progressing   

## 2023-12-26 NOTE — Transfer of Care (Signed)
 Immediate Anesthesia Transfer of Care Note  Patient: [name]  Procedure(s) Performed: PROSTATECTOMY, RADICAL, ROBOT-ASSISTED, LAPAROSCOPIC LYMPHADENECTOMY, PELVIS, ROBOT-ASSISTED (Bilateral)  Patient Location: PACU  Anesthesia Type:General  Level of Consciousness: drowsy  Airway & Oxygen Therapy: Patient Spontanous Breathing and Patient connected to face mask oxygen  Post-op Assessment: Report given to RN and Post -op Vital signs reviewed and stable  Post vital signs: Reviewed and stable  Last Vitals:  Vitals Value Taken Time  BP 166/96 12/26/23 13:36  Temp    Pulse 71 12/26/23 13:41  Resp 17 12/26/23 13:41  SpO2 100 % 12/26/23 13:41  Vitals shown include unfiled device data.  Last Pain:  Vitals:   12/26/23 0557  TempSrc: Oral  PainSc:          Complications: No notable events documented.

## 2023-12-27 ENCOUNTER — Encounter (HOSPITAL_COMMUNITY): Payer: Self-pay | Admitting: Urology

## 2023-12-27 DIAGNOSIS — C61 Malignant neoplasm of prostate: Secondary | ICD-10-CM | POA: Diagnosis not present

## 2023-12-27 LAB — CREATININE, FLUID (PLEURAL, PERITONEAL, JP DRAINAGE): Creat, Fluid: 1.2 mg/dL

## 2023-12-27 LAB — HEMOGLOBIN AND HEMATOCRIT, BLOOD
HCT: 29.1 % — ABNORMAL LOW (ref 39.0–52.0)
HCT: 29.3 % — ABNORMAL LOW (ref 39.0–52.0)
Hemoglobin: 9.4 g/dL — ABNORMAL LOW (ref 13.0–17.0)
Hemoglobin: 9.5 g/dL — ABNORMAL LOW (ref 13.0–17.0)

## 2023-12-27 LAB — BASIC METABOLIC PANEL WITH GFR
Anion gap: 11 (ref 5–15)
BUN: 11 mg/dL (ref 8–23)
CO2: 27 mmol/L (ref 22–32)
Calcium: 8.8 mg/dL — ABNORMAL LOW (ref 8.9–10.3)
Chloride: 100 mmol/L (ref 98–111)
Creatinine, Ser: 1.31 mg/dL — ABNORMAL HIGH (ref 0.61–1.24)
GFR, Estimated: >60 mL/min (ref >60)
Glucose, Bld: 119 mg/dL — ABNORMAL HIGH (ref 70–99)
Potassium: 3.3 mmol/L — ABNORMAL LOW (ref 3.5–5.1)
Sodium: 138 mmol/L (ref 135–145)

## 2023-12-27 NOTE — TOC Initial Note (Signed)
 Transition of Care [facility]) - Initial/Assessment Note    Patient Details  Name: [name] MRN: [id number] Date of Birth: 1960/06/26  Transition of [facility] CM/SW Contact:    Bascom Service, RN Phone Number: 12/27/2023, 10:46 AM  Clinical Narrative:  d/c plan home.                 Expected Discharge Plan: Home/Self Care Barriers to Discharge: Continued Medical Work up   Patient Goals and CMS Choice Patient states their goals for this hospitalization and ongoing recovery are:: Home CMS Medicare.gov Compare Post Acute Care list provided to:: Patient Choice offered to / list presented to : Patient [place] ownership interest in [facility].provided to:: Patient    Expected Discharge Plan and Services       Living arrangements for the past 2 months: Single Family Home                                      Prior Living Arrangements/Services Living arrangements for the past 2 months: Single Family Home Lives with:: Spouse                   Activities of Daily Living   ADL Screening (condition at time of admission) Independently performs ADLs?: Yes (appropriate for developmental age) Is the patient deaf or have difficulty hearing?: Yes Does the patient have difficulty seeing, even when wearing glasses/contacts?: No Does the patient have difficulty concentrating, remembering, or making decisions?: No  Permission Sought/Granted                  Emotional Assessment              Admission diagnosis:  Prostate cancer [facility]) [C61] Patient Active Problem List   Diagnosis Date Noted   Foot callus 10/27/2023   Onychomycosis 10/27/2023   Palpitations 08/25/2022   Prostate cancer (HCC) 03/06/2019   Family history of colon cancer 10/19/2018   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   Spinal stenosis at L4-L5 level 09/22/2016   AV block    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   Depression 11/19/2015   Primary osteoarthritis of  first carpometacarpal joint of left hand 10/23/2015   Chronic renal insufficiency, stage II (mild) 07/02/2014   Coronary artery disease involving native coronary artery of native heart with angina pectoris (HCC)    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/2013   Migraine headache 03/26/2013   GERD (gastroesophageal reflux disease) 11/14/2012   Routine general medical examination at a health care facility 07/28/2012   ERECTILE DYSFUNCTION 08/12/2010   Hyperlipidemia with target LDL less than 70 10/15/2009   TOBACCO USE 10/15/2009   Essential hypertension 10/15/2009   MYOCARDIAL INFARCTION, HX OF 10/15/2009   PCP:  Delbert Clam, MD Pharmacy:   [id number] [address], [place] - [address] AT [facility] [address]  [place] [address] Phone: [phone] Fax: [phone]     Social Drivers of Health (SDOH) Social History: SDOH Screenings   Food Insecurity: Food Insecurity Present (12/26/2023)  Housing: Patient Declined (12/26/2023)  Transportation Needs: No Transportation Needs (12/26/2023)  Utilities: Not At Risk (12/26/2023)  Alcohol Screen: Low Risk  (03/09/2023)  Depression (PHQ2-9): Low Risk  (11/08/2023)  Recent Concern: Depression (PHQ2-9) - Medium Risk (10/27/2023)  Financial Resource Strain: Medium Risk (03/09/2023)  Physical Activity: Inactive (03/09/2023)  Social Connections: Moderately Isolated (12/26/2023)  Stress: Stress  Concern Present (03/09/2023)  Tobacco Use: Medium Risk (12/26/2023)  Health Literacy: Adequate Health Literacy (03/09/2023)   SDOH Interventions:     Readmission Risk Interventions     No data to display

## 2023-12-27 NOTE — Progress Notes (Signed)
 Discharge instructions given to patient and wife including Foley care and instructions changing to leg bag D [name]

## 2023-12-27 NOTE — Discharge Summary (Signed)
 Date of admission: 12/26/2023  Date of discharge: 12/27/2023  Admission diagnosis: Prostate cancer  Discharge diagnosis: Prostate cancer  Secondary diagnoses: None  History and Physical: For full details, please see admission history and physical. Briefly, [name] is a 63 y.o. year old patient with prostate cancer who underwent RALP with b/l PLND.   Hospital Course: The patient recovered in the usual expected fashion.  He had his diet advanced slowly.  Initially managed with IV pain control, then transitioned to PO meds when he was tolerating oral intake.  His labs were stable throughout the hospital course. Hemoglobin was stable. He was discharged to home on POD#1.  JP creatinine was consistent with serum and order placed for removal prior to discharge. At the time of discharge the patient was tolerating a regular diet, passing flatus, ambulating, had adequate pain control and was agreeable to discharge.  Follow up as scheduled.    Laboratory values:  Recent Labs    12/26/23 1359 12/27/23 0429 12/27/23 1200  HGB 10.6* 9.4* 9.5*  HCT 33.7* 29.1* 29.3*   Recent Labs    12/27/23 0429  CREATININE 1.31*    Disposition: Home  Discharge instruction: The patient was instructed to be ambulatory but told to refrain from heavy lifting, strenuous activity, or driving.   Discharge medications:  Allergies as of 12/27/2023       Reactions   Lisinopril Swelling   lips swelling        Medication List     STOP taking these medications    clopidogrel  75 MG tablet Commonly known as: PLAVIX        TAKE these medications    amLODipine  10 MG tablet Commonly known as: NORVASC  Take 1 tablet (10 mg total) by mouth daily.   buPROPion  150 MG 24 hr tablet Commonly known as: Wellbutrin  XL Take 1 tablet (150 mg total) by mouth daily. For smoking cessation   docusate sodium  100 MG capsule Commonly known as: COLACE Take 1 capsule (100 mg total) by mouth 2 (two) times daily.    DULoxetine  60 MG capsule Commonly known as: Cymbalta  Take 1 capsule (60 mg total) by mouth daily. For chronic pain   HYDROcodone -acetaminophen  5-325 MG tablet Commonly known as: NORCO/VICODIN Take 1-2 tablets by mouth every 6 (six) hours as needed for moderate pain (pain score 4-6) or severe pain (pain score 7-10).   losartan -hydrochlorothiazide  100-25 MG tablet Commonly known as: HYZAAR  Take 1 tablet by mouth daily.   nitroGLYCERIN  0.4 MG SL tablet Commonly known as: NITROSTAT  Place 1 tablet (0.4 mg total) under the tongue every 5 (five) minutes as needed for chest pain.   ranolazine  500 MG 12 hr tablet Commonly known as: RANEXA  Take 1 tablet (500 mg total) by mouth 2 (two) times daily.   Repatha  SureClick 140 MG/ML Soaj Generic drug: Evolocumab  INJECT 140 MG INTO THE SKIN EVERY 14 (FOURTEEN) DAYS.   rosuvastatin  40 MG tablet Commonly known as: CRESTOR  Take 1 tablet (40 mg total) by mouth daily.   sulfamethoxazole -trimethoprim  800-160 MG tablet Commonly known as: BACTRIM  DS Take 1 tablet by mouth 2 (two) times daily. Start the day prior to foley removal appointment   terbinafine  250 MG tablet Commonly known as: LAMISIL  Take 1 tablet (250 mg total) by mouth daily. Come by [facility] for labs (liver function) to continue therapy.        Followup:   Follow-up Information     Selma Donnice SAUNDERS, MD Follow up on 01/02/2024.   Specialty:  Urology Why: at 8:30 Contact information: [address] [place] [address] [phone]                 Adina SAUNDERS. Phebe Dettmer MD [facility]  Pager: [phone]

## 2023-12-27 NOTE — Progress Notes (Signed)
 Gave patient PRN hyoscyamine  for bladder spasms. After giving the patient the PRN hyoscyamine  the patient starting sweating profusely. RN told patient to wait a little bit prior to discharge to make sure patient is okay.

## 2023-12-27 NOTE — TOC Transition Note (Signed)
 Transition of Care [facility]) - Discharge Note   Patient Details  Name: [name] MRN: [id number] Date of Birth: [birth year]  Transition of Care [facility]) CM/SW Contact:  Bascom Service, RN Phone Number: 12/27/2023, 2:28 PM   Clinical Narrative: d/c home.      Final next level of care: Home/Self Care Barriers to Discharge: No Barriers Identified   Patient Goals and CMS Choice Patient states their goals for this hospitalization and ongoing recovery are:: Home CMS Medicare.gov Compare Post Acute Care list provided to:: Patient Choice offered to / list presented to : [name] ownership interest in [facility].provided to:: Patient    Discharge Placement                       Discharge Plan and Services Additional resources added to the After Visit Summary for                                       Social Drivers of Health (SDOH) Interventions SDOH Screenings   Food Insecurity: Food Insecurity Present (12/26/2023)  Housing: Patient Declined (12/26/2023)  Transportation Needs: No Transportation Needs (12/26/2023)  Utilities: Not At Risk (12/26/2023)  Alcohol Screen: Low Risk  (03/09/2023)  Depression (PHQ2-9): Low Risk  (11/08/2023)  Recent Concern: Depression (PHQ2-9) - Medium Risk (10/27/2023)  Financial Resource Strain: Medium Risk (03/09/2023)  Physical Activity: Inactive (03/09/2023)  Social Connections: Moderately Isolated (12/26/2023)  Stress: Stress Concern Present (03/09/2023)  Tobacco Use: Medium Risk (12/26/2023)  Health Literacy: Adequate Health Literacy (03/09/2023)     Readmission Risk Interventions     No data to display

## 2023-12-27 NOTE — Discharge Summary (Shared)
 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   Spinal stenosis at L4-L5 level 09/22/2016   AV block    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   Depression 11/19/2015   Primary osteoarthritis of first carpometacarpal joint of left hand 10/23/2015   Chronic renal insufficiency, stage II (mild) 07/02/2014   Coronary artery disease involving native coronary artery of native heart with angina pectoris (HCC)    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/2013   Migraine headache 03/26/2013   GERD (gastroesophageal reflux disease) 11/14/2012   Routine general medical examination at a health care facility 07/28/2012   ERECTILE DYSFUNCTION 08/12/2010   Hyperlipidemia with target LDL less than 70 10/15/2009   TOBACCO USE 10/15/2009   Essential hypertension 10/15/2009   MYOCARDIAL INFARCTION, HX OF 10/15/2009    Procedures performed: Procedure(s): PROSTATECTOMY, RADICAL, ROBOT-ASSISTED, LAPAROSCOPIC LYMPHADENECTOMY, PELVIS, ROBOT-ASSISTED  History and Physical: For full details, please see admission history and physical. Briefly, [name] is a 63 y.o. year old patient with favorable intermediate risk prostate cancer.   Hospital Course: Patient tolerated the procedure well.  He was then transferred to the floor after an uneventful PACU stay.  His hospital course was uncomplicated.  On POD# he had met discharge criteria: was eating a regular diet, was up and ambulating independently,  pain was well controlled, and was ready to for discharge.   Laboratory values:  Recent Labs    12/26/23 1359  12/27/23 0429 12/27/23 1200  HGB 10.6* 9.4* 9.5*  HCT 33.7* 29.1* 29.3*   Recent Labs    12/27/23 0429  NA 138  K 3.3*  CL 100  CO2 27  GLUCOSE 119*  BUN 11  CREATININE 1.31*  CALCIUM  8.8*   No results for input(s): LABPT, INR in the last 72 hours. No results for input(s): LABURIN in the last 72 hours. Results for orders placed or performed in visit on 07/08/22  Urine Culture     Status: None   Collection Time: 07/08/22  9:39 AM   Specimen: Urine   UR  Result Value Ref Range Status   Urine Culture, Routine Final report  Final   Organism ID, Bacteria No growth  Final    Physical Exam  Gen: NAD Resp: Satting well on RA Card: Regular rate Abd: Soft, appropriately tender, ND, incision clean dry and intact GU: Foley catheter in place draining urine. Neuro: Alert   Disposition: Home  Discharge instruction: The patient was instructed to be ambulatory but told to refrain from heavy lifting, strenuous activity, or driving.   Discharge medications:  Allergies as of 12/27/2023       Reactions   Lisinopril Swelling   lips swelling        Medication List     STOP taking these medications    clopidogrel  75 MG tablet Commonly known as: PLAVIX        TAKE these medications    amLODipine  10 MG tablet Commonly known as: NORVASC  Take 1 tablet (10 mg total) by mouth daily.   buPROPion  150 MG 24 hr tablet Commonly known as: Wellbutrin  XL Take 1 tablet (150 mg total) by mouth daily. For smoking cessation   docusate  sodium 100 MG capsule Commonly known as: COLACE Take 1 capsule (100 mg total) by mouth 2 (two) times daily.   DULoxetine  60 MG capsule Commonly known as: Cymbalta  Take 1 capsule (60 mg total) by mouth daily. For chronic pain   HYDROcodone -acetaminophen  5-325 MG tablet Commonly known as: NORCO/VICODIN Take 1-2 tablets by mouth every 6 (six) hours as needed for moderate pain (pain score 4-6) or severe pain (pain score 7-10).    losartan -hydrochlorothiazide  100-25 MG tablet Commonly known as: HYZAAR  Take 1 tablet by mouth daily.   nitroGLYCERIN  0.4 MG SL tablet Commonly known as: NITROSTAT  Place 1 tablet (0.4 mg total) under the tongue every 5 (five) minutes as needed for chest pain.   ranolazine  500 MG 12 hr tablet Commonly known as: RANEXA  Take 1 tablet (500 mg total) by mouth 2 (two) times daily.   Repatha  SureClick 140 MG/ML Soaj Generic drug: Evolocumab  INJECT 140 MG INTO THE SKIN EVERY 14 (FOURTEEN) DAYS.   rosuvastatin  40 MG tablet Commonly known as: CRESTOR  Take 1 tablet (40 mg total) by mouth daily.   sulfamethoxazole -trimethoprim  800-160 MG tablet Commonly known as: BACTRIM  DS Take 1 tablet by mouth 2 (two) times daily. Start the day prior to foley removal appointment   terbinafine  250 MG tablet Commonly known as: LAMISIL  Take 1 tablet (250 mg total) by mouth daily. Come by [facility] for labs (liver function) to continue therapy.        Followup:   Follow-up Information     Selma Donnice SAUNDERS, MD Follow up on 01/02/2024.   Specialty: Urology Why: at 8:30 Contact information: [address] [place] [address] [phone]

## 2023-12-28 ENCOUNTER — Other Ambulatory Visit: Payer: Self-pay

## 2023-12-28 ENCOUNTER — Telehealth: Payer: Self-pay

## 2023-12-28 ENCOUNTER — Observation Stay (HOSPITAL_COMMUNITY)
Admission: EM | Admit: 2023-12-28 | Discharge: 2023-12-29 | Disposition: A | Discharge status: Home / Self Care | Attending: Urology | Admitting: Urology

## 2023-12-28 ENCOUNTER — Encounter (HOSPITAL_COMMUNITY): Payer: Self-pay

## 2023-12-28 DIAGNOSIS — N179 Acute kidney failure, unspecified: Principal | ICD-10-CM | POA: Insufficient documentation

## 2023-12-28 DIAGNOSIS — I2510 Atherosclerotic heart disease of native coronary artery without angina pectoris: Secondary | ICD-10-CM | POA: Diagnosis not present

## 2023-12-28 DIAGNOSIS — R339 Retention of urine, unspecified: Secondary | ICD-10-CM | POA: Diagnosis not present

## 2023-12-28 DIAGNOSIS — Z87891 Personal history of nicotine dependence: Secondary | ICD-10-CM | POA: Insufficient documentation

## 2023-12-28 DIAGNOSIS — R109 Unspecified abdominal pain: Secondary | ICD-10-CM | POA: Diagnosis present

## 2023-12-28 DIAGNOSIS — I25119 Atherosclerotic heart disease of native coronary artery with unspecified angina pectoris: Secondary | ICD-10-CM

## 2023-12-28 DIAGNOSIS — Z8546 Personal history of malignant neoplasm of prostate: Secondary | ICD-10-CM | POA: Insufficient documentation

## 2023-12-28 MED ORDER — HYDROMORPHONE HCL 1 MG/ML IJ SOLN
1.0000 mg | Freq: Once | INTRAMUSCULAR | Status: AC
  Administered 2023-12-28: 1 mg via INTRAVENOUS
  Filled 2023-12-28: qty 1

## 2023-12-28 MED ORDER — ONDANSETRON HCL 4 MG/2ML IJ SOLN
4.0000 mg | Freq: Once | INTRAMUSCULAR | Status: AC
  Administered 2023-12-28: 4 mg via INTRAVENOUS
  Filled 2023-12-28: qty 2

## 2023-12-28 MED ORDER — SODIUM CHLORIDE 0.9 % IV BOLUS
1000.0000 mL | Freq: Once | INTRAVENOUS | Status: AC
  Administered 2023-12-28: 1000 mL via INTRAVENOUS

## 2023-12-28 NOTE — Telephone Encounter (Signed)
 FYI-   I spoke to the patient's wife, [name] and she is concerned about his pain. He has Norco but she said that has not been helping. She stated that she called the urologist about the pain and was told that he can take tylenol  in addition to the norco but not exceed 1000 mg because there is acetaminophen  with the norco, She said that has not helped, he states he is hurting all over. She explained that he has not moved his bowels since coming home and he has taken the colace.  She also explained that his foley catheter is in place but is not draining much urine. She said that only about 1 cup of yellow urine has drained since he came home and only about 1 teaspoon since last night. She said he is not drinking much despite encouraging him to drink. I instructed her to call the urologist back and inform them of the limited urine output we we need to make sure there is not a clot or obstruction with the catheter and she said she would call.   She has all of his medications except the nitroglycerin .  She said the rx has expired and a new one is needed

## 2023-12-28 NOTE — ED Triage Notes (Signed)
 PT complaining of nausea and vomiting today. Lower abdominal pain, and no urine output since a procedure on his prostate on Monday.

## 2023-12-28 NOTE — ED Provider Notes (Signed)
 [place] EMERGENCY DEPARTMENT AT [facility] Provider Note   CSN: [id number] Arrival date & time: 12/28/23  2309     Patient presents with: Nausea   [name] is a 63 y.o. male.  {Add pertinent medical, surgical, social history, OB history to YEP:67052} The history is provided by the patient.   [name] is a 63 y.o. male who presents to the Emergency Department complaining of *** Prostatectomy Monday.  Vomiting and uncontrolled pain since getting home.  Has pain throughout abdomen and rectum.  Vomited three times. No fever. Has decreased drainage from foley.   Hx/o HTN, cva.  Taking meds as prescribed.       Prior to Admission medications   Medication Sig Start Date End Date Taking? Authorizing Provider  amLODipine  (NORVASC ) 10 MG tablet Take 1 tablet (10 mg total) by mouth daily. 08/23/23   Newlin, Enobong, MD  buPROPion  (WELLBUTRIN  XL) 150 MG 24 hr tablet Take 1 tablet (150 mg total) by mouth daily. For smoking cessation Patient not taking: Reported on 12/12/2023 08/23/23   Newlin, Enobong, MD  docusate sodium  (COLACE) 100 MG capsule Take 1 capsule (100 mg total) by mouth 2 (two) times daily. 12/26/23   Cory Palma, PA-C  DULoxetine  (CYMBALTA ) 60 MG capsule Take 1 capsule (60 mg total) by mouth daily. For chronic pain 11/08/23   Delbert Clam, MD  Evolocumab  (REPATHA  SURECLICK) 140 MG/ML SOAJ INJECT 140 MG INTO THE SKIN EVERY 14 (FOURTEEN) DAYS. 09/26/23   Santo Stanly LABOR, MD  HYDROcodone -acetaminophen  (NORCO/VICODIN) 5-325 MG tablet Take 1-2 tablets by mouth every 6 (six) hours as needed for moderate pain (pain score 4-6) or severe pain (pain score 7-10). 12/26/23   Cory Palma, PA-C  losartan -hydrochlorothiazide  (HYZAAR ) 100-25 MG tablet Take 1 tablet by mouth daily. 11/08/23   Newlin, Enobong, MD  nitroGLYCERIN  (NITROSTAT ) 0.4 MG SL tablet Place 1 tablet (0.4 mg total) under the tongue every 5 (five) minutes as needed for chest pain. Patient  not taking: Reported on 12/28/2023 08/25/22   Santo Stanly LABOR, MD  ranolazine  (RANEXA ) 500 MG 12 hr tablet Take 1 tablet (500 mg total) by mouth 2 (two) times daily. 08/23/23   Newlin, Enobong, MD  rosuvastatin  (CRESTOR ) 40 MG tablet Take 1 tablet (40 mg total) by mouth daily. 08/23/23   Newlin, Enobong, MD  sulfamethoxazole -trimethoprim  (BACTRIM  DS) 800-160 MG tablet Take 1 tablet by mouth 2 (two) times daily. Start the day prior to foley removal appointment 12/26/23   Cory Palma, PA-C  terbinafine  (LAMISIL ) 250 MG tablet Take 1 tablet (250 mg total) by mouth daily. Come by [facility] for labs (liver function) to continue therapy. Patient not taking: Reported on 12/12/2023 11/28/23   Newlin, Enobong, MD    Allergies: Lisinopril    Review of Systems  All other systems reviewed and are negative.   Updated Vital Signs BP (!) 156/92 (BP Location: Right Arm)   Pulse 90   Temp 98.7 F (37.1 C) (Oral)   Resp 18   Ht 5' 8 (1.727 m)   Wt 70.3 kg   SpO2 99%   BMI 23.57 kg/m   Physical Exam Vitals and nursing note reviewed.  Constitutional:      Appearance: He is well-developed.  HENT:     Head: Normocephalic and atraumatic.  Cardiovascular:     Rate and Rhythm: Normal rate and regular rhythm.  Pulmonary:     Effort: Pulmonary effort is normal. No respiratory distress.  Abdominal:  Comments: Distended abdomen with appropriate generalized tenderness.  Surgical sites c/d/i  Musculoskeletal:        General: No tenderness.  Skin:    General: Skin is warm and dry.  Neurological:     Mental Status: He is alert and oriented to person, place, and time.  Psychiatric:        Behavior: Behavior normal.     (all labs ordered are listed, but only abnormal results are displayed) Labs Reviewed - No data to display  EKG: None  Radiology: No results found.  {Document cardiac monitor, telemetry assessment procedure when appropriate:32947} Procedures    Medications Ordered in the ED - No data to display    {Click here for ABCD2, HEART and other calculators REFRESH Note before signing:1}                              Medical Decision Making  ***  {Document critical care time when appropriate  Document review of labs and clinical decision tools ie CHADS2VASC2, etc  Document your independent review of radiology images and any outside records  Document your discussion with family members, caretakers and with consultants  Document social determinants of health affecting pt's care  Document your decision making why or why not admission, treatments were needed:32947:::1}   Final diagnoses:  None    ED Discharge Orders     None

## 2023-12-28 NOTE — Transitions of Care (Post Inpatient/ED Visit) (Signed)
 12/28/2023  Name: [name] MRN: [id number] DOB: 1961-03-20  Today's TOC FU Call Status: Today's TOC FU Call Status:: Successful TOC FU Call Completed TOC FU Call Complete Date: 12/28/23 Patient's Name and Date of Birth confirmed.  Transition Care Management Follow-up Telephone Call Date of Discharge: 12/27/23 Discharge Facility: [name] [facility]) Type of Discharge: Inpatient Admission Primary Inpatient Discharge Diagnosis:: prostate cancer- s/p prostatectomy How have you been since you were released from the hospital?: Worse (call completed with patient's wife, [name]. She explained that he has been in a great deal of pain since returning home. she contacted the urologist this morning.  she said he is currenly on his knees on the floor, with his head on the bed.) Any questions or concerns?: Yes Patient Questions/Concerns:: [name] is concerned about his pain.  He has Norco but she said that has not been helping. She stated that she called the urologist about the pain and was told that he can take tylenol  in addition to the norco but not exceed 1000 mg because there is acetaminophen  with the norco,  She said that has not helped, he states he is hurting all over.  She explained that he has not moved his bowels since coming home and he has taken the colace.  She also explained that his foley catheter is in place but is not draining much urine. She said that only about 1 cup of yellow urine has drained since he came home and only about 1 teaspoon since last night.  She said he is not drinking much despite encouraging him to drink. I instructed her to call the urologist back and inform them of the limited urine output we we need to make sure there is not a clot or obstruction with the catheter and she said she would call. Patient Questions/Concerns Addressed: Other: (wife to call urologist)  Items Reviewed: Did you receive and understand the discharge instructions provided?: Yes Medications  obtained,verified, and reconciled?: Yes (Medications Reviewed) Any new allergies since your discharge?: No Dietary orders reviewed?: Yes Type of Diet Ordered:: He is currently only taking clear liquids. Do you have support at home?: Yes People in Home [RPT]: spouse Name of Support/Comfort Primary Source: his wife  Medications Reviewed Today: Medications Reviewed Today     Reviewed by Marvis Bradley, RN (Case Manager) on 12/28/23 at 1528  Med List Status: <None>   Medication Order Taking? Sig Documenting Provider Last Dose Status Informant  amLODipine  (NORVASC ) 10 MG tablet [id number]  Take 1 tablet (10 mg total) by mouth daily. Newlin, Enobong, MD  Active Spouse/Significant Other  buPROPion  (WELLBUTRIN  XL) 150 MG 24 hr tablet [id number]  Take 1 tablet (150 mg total) by mouth daily. For smoking cessation  Patient not taking: Reported on 12/12/2023   Newlin, Enobong, MD  Active Spouse/Significant Other  docusate sodium  (COLACE) 100 MG capsule [id number]  Take 1 capsule (100 mg total) by mouth 2 (two) times daily. Cory Palma, PA-C  Active   DULoxetine  (CYMBALTA ) 60 MG capsule [id number]  Take 1 capsule (60 mg total) by mouth daily. For chronic pain Delbert Clam, MD  Active Spouse/Significant Other  Evolocumab  (REPATHA  SURECLICK) 140 MG/ML SOAJ [id number]  INJECT 140 MG INTO THE SKIN EVERY 14 (FOURTEEN) DAYS. Santo Stanly LABOR, MD  Active Spouse/Significant Other  HYDROcodone -acetaminophen  (NORCO/VICODIN) 5-325 MG tablet [id number]  Take 1-2 tablets by mouth every 6 (six) hours as needed for moderate pain (pain score 4-6) or severe pain (pain score 7-10). Cory Palma, PA-C  Active  losartan -hydrochlorothiazide  (HYZAAR ) 100-25 MG tablet [id number]  Take 1 tablet by mouth daily. Newlin, Enobong, MD  Active Spouse/Significant Other  nitroGLYCERIN  (NITROSTAT ) 0.4 MG SL tablet [id number]  Place 1 tablet (0.4 mg total) under the tongue every 5 (five) minutes as needed for chest pain. Santo Stanly LABOR, MD  Active Spouse/Significant Other           Med Note CHRISTIE ALYSON Kitchens Dec 12, 2023  9:09 AM) outdate  ranolazine  (RANEXA ) 500 MG 12 hr tablet [id number]  Take 1 tablet (500 mg total) by mouth 2 (two) times daily. Newlin, Enobong, MD  Active Spouse/Significant Other  rosuvastatin  (CRESTOR ) 40 MG tablet [id number]  Take 1 tablet (40 mg total) by mouth daily. Newlin, Enobong, MD  Active Spouse/Significant Other  sulfamethoxazole -trimethoprim  (BACTRIM  DS) 800-160 MG tablet [id number]  Take 1 tablet by mouth 2 (two) times daily. Start the day prior to foley removal appointment Cory Palma, PA-C  Active   terbinafine  (LAMISIL ) 250 MG tablet [id number]  Take 1 tablet (250 mg total) by mouth daily. Come by [facility] for labs (liver function) to continue therapy.  Patient not taking: Reported on 12/12/2023   Newlin, Enobong, MD  Active Spouse/Significant Other            Home Care and Equipment/Supplies: Were Home Health Services Ordered?: No Any new equipment or medical supplies ordered?: No  Functional Questionnaire: Do you need assistance with bathing/showering or dressing?: Yes Do you need assistance with meal preparation?: Yes Do you need assistance with eating?: No Do you have difficulty maintaining continence:  (He currenlty has a foley catheter) Do you need assistance with getting out of bed/getting out of a chair/moving?: Yes (he is currently in a great deal of pain.) Do you have difficulty managing or taking your medications?: Yes (His wife is managing his medications)  Follow up appointments reviewed: PCP Follow-up appointment confirmed?: Yes Date of PCP follow-up appointment?: 05/09/24 Follow-up Provider: [facility] Follow-up appointment confirmed?: Yes Date of Specialist follow-up appointment?: 01/02/24 Follow-Up Specialty Provider:: Urologist Do you need transportation to your follow-up appointment?: No Do you understand  care options if your condition(s) worsen?: Yes-patient verbalized understanding    SIGNATURE Slater Diesel, RN

## 2023-12-29 ENCOUNTER — Emergency Department (HOSPITAL_COMMUNITY)

## 2023-12-29 DIAGNOSIS — N179 Acute kidney failure, unspecified: Principal | ICD-10-CM | POA: Diagnosis present

## 2023-12-29 LAB — COMPREHENSIVE METABOLIC PANEL WITH GFR
ALT: 16 U/L (ref 0–44)
AST: 33 U/L (ref 15–41)
Albumin: 4.2 g/dL (ref 3.5–5.0)
Alkaline Phosphatase: 63 U/L (ref 38–126)
Anion gap: 18 — ABNORMAL HIGH (ref 5–15)
BUN: 35 mg/dL — ABNORMAL HIGH (ref 8–23)
CO2: 26 mmol/L (ref 22–32)
Calcium: 9.3 mg/dL (ref 8.9–10.3)
Chloride: 92 mmol/L — ABNORMAL LOW (ref 98–111)
Creatinine, Ser: 5.25 mg/dL — ABNORMAL HIGH (ref 0.61–1.24)
GFR, Estimated: 12 mL/min — ABNORMAL LOW
Glucose, Bld: 175 mg/dL — ABNORMAL HIGH (ref 70–99)
Potassium: 4 mmol/L (ref 3.5–5.1)
Sodium: 136 mmol/L (ref 135–145)
Total Bilirubin: 0.8 mg/dL (ref 0.0–1.2)
Total Protein: 8.7 g/dL — ABNORMAL HIGH (ref 6.5–8.1)

## 2023-12-29 LAB — CBC WITH DIFFERENTIAL/PLATELET
Abs Immature Granulocytes: 0.06 10*3/uL (ref 0.00–0.07)
Basophils Absolute: 0 10*3/uL (ref 0.0–0.1)
Basophils Relative: 0 %
Eosinophils Absolute: 0 10*3/uL (ref 0.0–0.5)
Eosinophils Relative: 0 %
HCT: 33.2 % — ABNORMAL LOW (ref 39.0–52.0)
Hemoglobin: 10.9 g/dL — ABNORMAL LOW (ref 13.0–17.0)
Immature Granulocytes: 0 %
Lymphocytes Relative: 3 %
Lymphs Abs: 0.6 10*3/uL — ABNORMAL LOW (ref 0.7–4.0)
MCH: 30.7 pg (ref 26.0–34.0)
MCHC: 32.8 g/dL (ref 30.0–36.0)
MCV: 93.5 fL (ref 80.0–100.0)
Monocytes Absolute: 0.6 10*3/uL (ref 0.1–1.0)
Monocytes Relative: 3 %
Neutro Abs: 15.4 10*3/uL — ABNORMAL HIGH (ref 1.7–7.7)
Neutrophils Relative %: 94 %
Platelets: 339 10*3/uL (ref 150–400)
RBC: 3.55 MIL/uL — ABNORMAL LOW (ref 4.22–5.81)
RDW: 12.4 % (ref 11.5–15.5)
WBC: 16.6 10*3/uL — ABNORMAL HIGH (ref 4.0–10.5)
nRBC: 0 % (ref 0.0–0.2)

## 2023-12-29 LAB — LIPASE, BLOOD: Lipase: 22 U/L (ref 11–51)

## 2023-12-29 LAB — BASIC METABOLIC PANEL WITH GFR
Anion gap: 13 (ref 5–15)
Anion gap: 12 (ref 5–15)
BUN: 30 mg/dL — ABNORMAL HIGH (ref 8–23)
BUN: 31 mg/dL — ABNORMAL HIGH (ref 8–23)
CO2: 28 mmol/L (ref 22–32)
CO2: 29 mmol/L (ref 22–32)
Calcium: 8.9 mg/dL (ref 8.9–10.3)
Calcium: 8.9 mg/dL (ref 8.9–10.3)
Chloride: 94 mmol/L — ABNORMAL LOW (ref 98–111)
Chloride: 94 mmol/L — ABNORMAL LOW (ref 98–111)
Creatinine, Ser: 3.03 mg/dL — ABNORMAL HIGH (ref 0.61–1.24)
Creatinine, Ser: 2.3 mg/dL — ABNORMAL HIGH (ref 0.61–1.24)
GFR, Estimated: 23 mL/min — ABNORMAL LOW (ref >60)
GFR, Estimated: 31 mL/min — ABNORMAL LOW (ref >60)
Glucose, Bld: 127 mg/dL — ABNORMAL HIGH (ref 70–99)
Glucose, Bld: 117 mg/dL — ABNORMAL HIGH (ref 70–99)
Potassium: 3.6 mmol/L (ref 3.5–5.1)
Potassium: 3.8 mmol/L (ref 3.5–5.1)
Sodium: 135 mmol/L (ref 135–145)
Sodium: 135 mmol/L (ref 135–145)

## 2023-12-29 LAB — SURGICAL PATHOLOGY

## 2023-12-29 MED ORDER — BUPROPION HCL ER (XL) 150 MG PO TB24: 150.0000 mg | ORAL_TABLET | Freq: Every day | ORAL | Status: DC

## 2023-12-29 MED ORDER — DOCUSATE SODIUM 100 MG PO CAPS
100.0000 mg | ORAL_CAPSULE | Freq: Two times a day (BID) | ORAL | Status: DC
  Administered 2023-12-29: 100 mg via ORAL
  Filled 2023-12-29: qty 1

## 2023-12-29 MED ORDER — SODIUM CHLORIDE 0.9% FLUSH
3.0000 mL | Freq: Two times a day (BID) | INTRAVENOUS | Status: DC
  Administered 2023-12-29: 3 mL via INTRAVENOUS

## 2023-12-29 MED ORDER — HYDROCODONE-ACETAMINOPHEN 5-325 MG PO TABS: 1.0000 | ORAL_TABLET | Freq: Four times a day (QID) | ORAL | Status: DC | PRN

## 2023-12-29 MED ORDER — DULOXETINE HCL 30 MG PO CPEP
60.0000 mg | ORAL_CAPSULE | Freq: Every day | ORAL | Status: DC
  Administered 2023-12-29: 60 mg via ORAL
  Filled 2023-12-29: qty 2

## 2023-12-29 MED ORDER — AMLODIPINE BESYLATE 10 MG PO TABS
10.0000 mg | ORAL_TABLET | Freq: Every day | ORAL | Status: DC
Start: 2023-12-29 — End: 2023-12-29
  Administered 2023-12-29: 10 mg via ORAL
  Filled 2023-12-29: qty 1

## 2023-12-29 MED ORDER — ROSUVASTATIN CALCIUM 10 MG PO TABS
40.0000 mg | ORAL_TABLET | Freq: Every day | ORAL | Status: DC
  Administered 2023-12-29: 40 mg via ORAL
  Filled 2023-12-29: qty 4

## 2023-12-29 MED ORDER — LOSARTAN POTASSIUM 50 MG PO TABS
100.0000 mg | ORAL_TABLET | Freq: Every day | ORAL | Status: DC
  Administered 2023-12-29: 100 mg via ORAL
  Filled 2023-12-29: qty 2

## 2023-12-29 MED ORDER — SODIUM CHLORIDE 0.9% FLUSH: 3.0000 mL | INTRAVENOUS | Status: DC | PRN

## 2023-12-29 MED ORDER — NITROGLYCERIN 0.4 MG SL SUBL: 0.4000 mg | SUBLINGUAL_TABLET | SUBLINGUAL | 3 refills | Status: AC | PRN

## 2023-12-29 MED ORDER — RANOLAZINE ER 500 MG PO TB12
500.0000 mg | ORAL_TABLET | Freq: Two times a day (BID) | ORAL | Status: DC
  Administered 2023-12-29: 500 mg via ORAL
  Filled 2023-12-29: qty 1

## 2023-12-29 MED ORDER — ONDANSETRON HCL 4 MG/2ML IJ SOLN
4.0000 mg | INTRAMUSCULAR | Status: DC | PRN
Start: 2023-12-29 — End: 2023-12-29

## 2023-12-29 MED ORDER — HYDROCHLOROTHIAZIDE 25 MG PO TABS
25.0000 mg | ORAL_TABLET | Freq: Every day | ORAL | Status: DC
  Administered 2023-12-29: 25 mg via ORAL
  Filled 2023-12-29: qty 1

## 2023-12-29 MED ORDER — LACTULOSE 10 GM/15ML PO SOLN
10.0000 g | Freq: Three times a day (TID) | ORAL | 0 refills | Status: DC
Start: 2023-12-29 — End: 2024-01-26

## 2023-12-29 MED ORDER — SODIUM CHLORIDE 0.9 % IV SOLN: 250.0000 mL | INTRAVENOUS | Status: DC | PRN

## 2023-12-29 MED ORDER — LOSARTAN POTASSIUM-HCTZ 100-25 MG PO TABS: 1.0000 | ORAL_TABLET | Freq: Every day | ORAL | Status: DC

## 2023-12-29 MED ORDER — NITROGLYCERIN 0.4 MG SL SUBL: 0.4000 mg | SUBLINGUAL_TABLET | SUBLINGUAL | Status: DC | PRN

## 2023-12-29 NOTE — Discharge Instructions (Signed)
Activity:  You are encouraged to ambulate frequently (about every hour during waking hours) to help prevent blood clots from forming in your legs or lungs.  However, you should not engage in any heavy lifting (> 10-15 lbs), strenuous activity, or straining. Diet: You should continue a clear liquid diet until passing gas from below.  Once this occurs, you may advance your diet to a soft diet that would be easy to digest (i.e soups, scrambled eggs, mashed potatoes, etc.) for 24 hours just as you would if getting over a bad stomach flu.  If tolerating this diet well for 24 hours, you may then begin eating regular food.  It will be normal to have some amount of bloating, nausea, and abdominal discomfort intermittently. Prescriptions:  You will be provided a prescription for pain medication to take as needed.  If your pain is not severe enough to require the prescription pain medication, you may take extra strength Tylenol instead.  You should also take an over the counter stool softener (Colace 100 mg twice daily) to avoid straining with bowel movements as the pain medication may constipate you. Finally, you will also be provided a prescription for an antibiotic to begin the day prior to your return visit in the office for catheter removal. Catheter care: You will be taught how to take care of the catheter by the nursing staff prior to discharge from the hospital.  You may use both a leg bag and the larger bedside bag but it is recommended to at least use the bigger bedside bag at nighttime as the leg bag is small and will fill up overnight and also does not drain as well when lying flat. You may periodically feel a strong urge to void with the catheter in place.  This is a bladder spasm and most often can occur when having a bowel movement or when you are moving around. It is typically self-limited and usually will stop after a few minutes.  You may use some Vaseline or Neosporin around the tip of the catheter to  reduce friction at the tip of the penis. Incisions: You may remove your dressing bandages the 2nd day after surgery.  You most likely will have a few small staples in each of the incisions and once the bandages are removed, the incisions may stay open to air.  You may start showering (not soaking or bathing in water) 48 hours after surgery and the incisions simply need to be patted dry after the shower.  No additional care is needed. What to call us about: You should call the office ([phone]) if you develop fever > 101, persistent vomiting, or the catheter stops draining. Also, feel free to call with any other questions you may have and remember the handout that was provided to you as a reference preoperatively which answers many of the common questions that arise after surgery.  

## 2023-12-29 NOTE — Discharge Summary (Signed)
  Date of admission: 12/28/2023  Date of discharge: 12/29/2023  Admission diagnosis: Acute kidney injury, prostate cancer  Discharge diagnosis: Acute kidney injury, prostate cancer  Secondary diagnoses: Coronary artery disease  History and Physical: For full details, please see admission history and physical. Briefly, [name] is a 63 y.o. year old patient with prostate cancer s/p a robotic prostatectomy on 12/25/23.     Hospital Course: He presented to the ED on 12/28/23 after having been discharged earlier that day. His catheter was not draining and his Cr was 5.  Attempts to irrigate his catheter in the ED were unsuccessful.  I was able to deflate his balloon and advance his catheter with return of > 1 L of urine.  His Cr subsequently decreased and was 2.3 at the time of discharge.   He was ambulating and tolerating his diet.  Laboratory values:  Recent Labs    12/27/23 0429 12/27/23 1200 12/28/23 2344  HGB 9.4* 9.5* 10.9*  HCT 29.1* 29.3* 33.2*   Recent Labs    12/29/23 0738 12/29/23 1245  CREATININE 3.03* 2.30*    Disposition: Home  Discharge instruction: The patient was instructed to be ambulatory but told to refrain from heavy lifting, strenuous activity, or driving.   Discharge medications:  Allergies as of 12/29/2023       Reactions   Lisinopril Swelling   lips swelling        Medication List     TAKE these medications    amLODipine  10 MG tablet Commonly known as: NORVASC  Take 1 tablet (10 mg total) by mouth daily.   buPROPion  150 MG 24 hr tablet Commonly known as: Wellbutrin  XL Take 1 tablet (150 mg total) by mouth daily. For smoking cessation   docusate sodium  100 MG capsule Commonly known as: COLACE Take 1 capsule (100 mg total) by mouth 2 (two) times daily.   DULoxetine  60 MG capsule Commonly known as: Cymbalta  Take 1 capsule (60 mg total) by mouth daily. For chronic pain   HYDROcodone -acetaminophen  5-325 MG tablet Commonly known as:  NORCO/VICODIN Take 1-2 tablets by mouth every 6 (six) hours as needed for moderate pain (pain score 4-6) or severe pain (pain score 7-10).   losartan -hydrochlorothiazide  100-25 MG tablet Commonly known as: HYZAAR  Take 1 tablet by mouth daily.   nitroGLYCERIN  0.4 MG SL tablet Commonly known as: NITROSTAT  Place 1 tablet (0.4 mg total) under the tongue every 5 (five) minutes as needed for chest pain.   ranolazine  500 MG 12 hr tablet Commonly known as: RANEXA  Take 1 tablet (500 mg total) by mouth 2 (two) times daily.   Repatha  SureClick 140 MG/ML Soaj Generic drug: Evolocumab  INJECT 140 MG INTO THE SKIN EVERY 14 (FOURTEEN) DAYS.   rosuvastatin  40 MG tablet Commonly known as: CRESTOR  Take 1 tablet (40 mg total) by mouth daily.   sulfamethoxazole -trimethoprim  800-160 MG tablet Commonly known as: BACTRIM  DS Take 1 tablet by mouth 2 (two) times daily. Start the day prior to foley removal appointment   terbinafine  250 MG tablet Commonly known as: LAMISIL  Take 1 tablet (250 mg total) by mouth daily. Come by [facility] for labs (liver function) to continue therapy.        Followup:   Follow-up Information     Selma Donnice SAUNDERS, MD Follow up.   Specialty: Urology Why: Next Thursday Contact information: [address] [place] [address] [phone]

## 2023-12-29 NOTE — Plan of Care (Signed)
  Problem: Skin Integrity: Goal: Demonstration of wound healing without infection will improve Outcome: Progressing   Problem: Urinary Elimination: Goal: Ability to avoid or minimize complications of infection will improve Outcome: Progressing

## 2023-12-29 NOTE — Telephone Encounter (Signed)
 He is constipated because of the Norco.  I have sent a prescription for lactulose  to the pharmacy for his constipation and he can reduce the frequency if he is noticing more diarrhea.  I also refilled the nitroglycerin .  He was just discharged today and needs to call his urologist about his pain and urinary symptoms.

## 2023-12-29 NOTE — ED Notes (Signed)
 Called urology @ [phone]

## 2023-12-29 NOTE — H&P (Signed)
 H&P  Chief Complaint: Urinary retention and acute kidney injury  History of Present Illness: [name] is a 63 y.o. year old gentleman who is s/p a robotic radical prostatectomy by Dr. Selma on Monday.  He was discharged yesterday and has had poor catheter drainage since he got home.  He developed abdominal pain along with nausea and vomiting.  He presented to the ED and was noted to have poor urine output.  Attempts to irrigate the catheter by the nursing staff was unsuccessful.  Past Medical History:  Diagnosis Date   Apical variant hypertrophic cardiomyopathy (HCC)    CAD (coronary artery disease)    a. LHC in 2011 with severe dz in D1/D2 and very distal LAD--> too small for PCI and managed medically.  b.  LHC in 10/2013 after an abnormal nuclear study w/ non-obst dz/c. 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   HOH (hard of hearing)    rgiht ear   Hyperlipidemia    Hypertension    Myocardial infarction [facility])    Peripheral vascular disease (HCC)    Stroke [facility])     Past Surgical History:  Procedure Laterality Date   CARDIAC CATHETERIZATION N/A 02/24/2016   Procedure: Left Heart Cath and Coronary Angiography;  Surgeon: Ozell Fell, MD;  Location: [facility] INVASIVE CV LAB;  Service: Cardiovascular;  Laterality: N/A;   CARDIAC CATHETERIZATION N/A 02/24/2016   Procedure: Coronary Stent Intervention;  Surgeon: Ozell Fell, MD;  Location: [facility] INVASIVE CV LAB;  Service: Cardiovascular;  Laterality: N/A;   CORONARY ANGIOPLASTY WITH STENT PLACEMENT  2017   CORONARY BALLOON ANGIOPLASTY N/A 04/30/2019   Procedure: CORONARY  BALLOON ANGIOPLASTY;  Surgeon: Swaziland, Peter M, MD;  Location: [facility] INVASIVE CV LAB;  Service: Cardiovascular;  Laterality: N/A;   INGUINAL HERNIA REPAIR Left    LEFT HEART CATH AND CORONARY ANGIOGRAPHY N/A 04/30/2019   Procedure: LEFT HEART CATH AND CORONARY ANGIOGRAPHY;  Surgeon: Cherrie Toribio SAUNDERS, MD;  Location: [facility] INVASIVE CV LAB;  Service: Cardiovascular;  Laterality: N/A;   LEFT HEART CATH AND CORONARY ANGIOGRAPHY N/A 05/14/2019   Procedure: LEFT HEART CATH AND CORONARY ANGIOGRAPHY;  Surgeon: Swaziland, Peter M, MD;  Location: [facility] INVASIVE CV LAB;  Service: Cardiovascular;  Laterality: N/A;   LEFT HEART CATHETERIZATION WITH CORONARY ANGIOGRAM N/A 11/22/2013   Procedure: LEFT HEART CATHETERIZATION WITH CORONARY ANGIOGRAM;  Surgeon: Victory LELON Claudene DOUGLAS, MD;  Location: [facility] CATH LAB;  Service: Cardiovascular;  Laterality: N/A;   ROBOT ASSISTED LAPAROSCOPIC RADICAL PROSTATECTOMY N/A 12/26/2023   Procedure: PROSTATECTOMY, RADICAL, ROBOT-ASSISTED, LAPAROSCOPIC;  Surgeon: Selma Donnice SAUNDERS, MD;  Location: [facility] ORS;  Service: Urology;  Laterality: N/A;   TEE WITHOUT CARDIOVERSION N/A 08/22/2015   Procedure: TRANSESOPHAGEAL ECHOCARDIOGRAM (TEE);  Surgeon: Aleene JINNY Passe, MD;  Location: [facility] ENDOSCOPY;  Service: Cardiovascular;  Laterality: N/A;    Home Medications:  No current facility-administered medications on file prior to encounter.   Current Outpatient Medications on File Prior to Encounter  Medication Sig Dispense Refill   amLODipine  (NORVASC ) 10 MG tablet Take 1 tablet (10 mg total) by mouth daily.  90 tablet 1   buPROPion  (WELLBUTRIN  XL) 150 MG 24 hr tablet Take 1 tablet (150 mg total) by mouth daily. For smoking cessation (Patient not taking: Reported on 12/12/2023) 90 tablet 1   docusate sodium  (COLACE) 100 MG capsule Take 1 capsule (100 mg total) by mouth 2 (two) times daily.     DULoxetine  (CYMBALTA ) 60 MG capsule Take 1 capsule (60 mg total) by mouth daily. For chronic pain 90 capsule 1   Evolocumab   (REPATHA  SURECLICK) 140 MG/ML SOAJ INJECT 140 MG INTO THE SKIN EVERY 14 (FOURTEEN) DAYS. 6 mL 3   HYDROcodone -acetaminophen  (NORCO/VICODIN) 5-325 MG tablet Take 1-2 tablets by mouth every 6 (six) hours as needed for moderate pain (pain score 4-6) or severe pain (pain score 7-10). 20 tablet 0   losartan -hydrochlorothiazide  (HYZAAR ) 100-25 MG tablet Take 1 tablet by mouth daily. 90 tablet 1   nitroGLYCERIN  (NITROSTAT ) 0.4 MG SL tablet Place 1 tablet (0.4 mg total) under the tongue every 5 (five) minutes as needed for chest pain. (Patient not taking: Reported on 12/28/2023) 25 tablet 3   ranolazine  (RANEXA ) 500 MG 12 hr tablet Take 1 tablet (500 mg total) by mouth 2 (two) times daily. 180 tablet 1   rosuvastatin  (CRESTOR ) 40 MG tablet Take 1 tablet (40 mg total) by mouth daily. 90 tablet 1   sulfamethoxazole -trimethoprim  (BACTRIM  DS) 800-160 MG tablet Take 1 tablet by mouth 2 (two) times daily. Start the day prior to foley removal appointment 6 tablet 0   terbinafine  (LAMISIL ) 250 MG tablet Take 1 tablet (250 mg total) by mouth daily. Come by [facility] for labs (liver function) to continue therapy. (Patient not taking: Reported on 12/12/2023) 30 tablet 0     Allergies:  Allergies  Allergen Reactions   Lisinopril Swelling    lips swelling    Family History  Problem Relation Age of Onset   Hypertension Mother    Heart failure Mother    Hypertension Father    Colon cancer Father 51   Hypertension Brother    Heart disease Son    Heart disease Son    Colon cancer Paternal Uncle     Social History:  reports that he has quit smoking. His smoking use included cigarettes. He has never used smokeless tobacco. He reports that he does not drink alcohol and does not use drugs.  ROS: A complete review of systems was performed.  All systems are negative except for pertinent findings as noted.  Physical Exam:  Vital signs in last 24 hours: Temp:  [98.7 F (37.1 C)] 98.7 F (37.1  C) (07/30 2319) Pulse Rate:  [90] 90 (07/30 2319) Resp:  [18] 18 (07/30 2319) BP: (156)/(92) 156/92 (07/30 2319) SpO2:  [99 %] 99 % (07/30 2319) Weight:  [70.3 kg] 70.3 kg (07/30 2321) Constitutional:  Alert and oriented, No acute distress GI: Abdomen is distended GU: His 18 Fr Foley appears to be distally migrated and not in the correct position with no urine in the tubing or bag Lymphatic: No lymphadenopathy Neurologic: Grossly intact, no focal deficits Psychiatric: Normal mood and affect   Laboratory Data:  Recent Labs    12/26/23 1359 12/27/23 0429 12/27/23 1200 12/28/23 2344  WBC  --   --   --  16.6*  HGB 10.6* 9.4* 9.5* 10.9*  HCT 33.7* 29.1* 29.3* 33.2*  PLT  --   --   --  339    Recent Labs    12/27/23 0429 12/28/23 2344  NA  138 136  K 3.3* 4.0  CL 100 92*  GLUCOSE 119* 175*  BUN 11 35*  CALCIUM  8.8* 9.3  CREATININE 1.31* 5.25*     Results for orders placed or performed during the hospital encounter of 12/28/23 (from the past 24 hours)  Comprehensive metabolic panel     Status: Abnormal   Collection Time: 12/28/23 11:44 PM  Result Value Ref Range   Sodium 136 135 - 145 mmol/L   Potassium 4.0 3.5 - 5.1 mmol/L   Chloride 92 (L) 98 - 111 mmol/L   CO2 26 22 - 32 mmol/L   Glucose, Bld 175 (H) 70 - 99 mg/dL   BUN 35 (H) 8 - 23 mg/dL   Creatinine, Ser 4.74 (H) 0.61 - 1.24 mg/dL   Calcium  9.3 8.9 - 10.3 mg/dL   Total Protein 8.7 (H) 6.5 - 8.1 g/dL   Albumin  4.2 3.5 - 5.0 g/dL   AST 33 15 - 41 U/L   ALT 16 0 - 44 U/L   Alkaline Phosphatase 63 38 - 126 U/L   Total Bilirubin 0.8 0.0 - 1.2 mg/dL   GFR, Estimated 12 (L) >60 mL/min   Anion gap 18 (H) 5 - 15  CBC with Differential     Status: Abnormal   Collection Time: 12/28/23 11:44 PM  Result Value Ref Range   WBC 16.6 (H) 4.0 - 10.5 K/uL   RBC 3.55 (L) 4.22 - 5.81 MIL/uL   Hemoglobin 10.9 (L) 13.0 - 17.0 g/dL   HCT 66.7 (L) 60.9 - 47.9 %   MCV 93.5 80.0 - 100.0 fL   MCH 30.7 26.0 - 34.0 pg   MCHC  32.8 30.0 - 36.0 g/dL   RDW 87.5 88.4 - 84.4 %   Platelets 339 150 - 400 K/uL   nRBC 0.0 0.0 - 0.2 %   Neutrophils Relative % 94 %   Neutro Abs 15.4 (H) 1.7 - 7.7 K/uL   Lymphocytes Relative 3 %   Lymphs Abs 0.6 (L) 0.7 - 4.0 K/uL   Monocytes Relative 3 %   Monocytes Absolute 0.6 0.1 - 1.0 K/uL   Eosinophils Relative 0 %   Eosinophils Absolute 0.0 0.0 - 0.5 K/uL   Basophils Relative 0 %   Basophils Absolute 0.0 0.0 - 0.1 K/uL   Immature Granulocytes 0 %   Abs Immature Granulocytes 0.06 0.00 - 0.07 K/uL  Lipase, blood     Status: None   Collection Time: 12/28/23 11:44 PM  Result Value Ref Range   Lipase 22 11 - 51 U/L   No results found for this or any previous visit (from the past 240 hours).  Renal Function: Recent Labs    12/22/23 1129 12/27/23 0429 12/28/23 2344  CREATININE 1.16 1.31* 5.25*   Estimated Creatinine Clearance: 14.1 mL/min (A) (by C-G formula based on SCr of 5.25 mg/dL (H)).  Radiologic Imaging: CT ABDOMEN PELVIS WO CONTRAST Result Date: 12/29/2023 CLINICAL DATA:  Abdominal pain and history of prostatectomy 3 days ago, initial encounter EXAM: CT ABDOMEN AND PELVIS WITHOUT CONTRAST TECHNIQUE: Multidetector CT imaging of the abdomen and pelvis was performed following the standard protocol without IV contrast. RADIATION DOSE REDUCTION: This exam was performed according to the departmental dose-optimization program which includes automated exposure control, adjustment of the mA and/or kV according to patient size and/or use of iterative reconstruction technique. COMPARISON:  03/21/2023 FINDINGS: Lower chest: Lung bases are free of acute infiltrate or sizable effusion. Hepatobiliary: Liver and gallbladder appear within normal limits. Considerable  perihepatic fluid is identified. Pancreas: Unremarkable. No pancreatic ductal dilatation or surrounding inflammatory changes. Spleen: Normal in size without focal abnormality. Adrenals/Urinary Tract: Adrenal glands are within  normal limits. Kidneys demonstrate no renal calculi or obstructive changes. The bladder is decompressed by Foley catheter. Stomach/Bowel: No obstructive or inflammatory changes of the colon are seen. Scattered air is noted throughout the colon and small bowel likely related to a mild postoperative ileus. No obstructive changes are seen. The appendix is not well visualized although no inflammatory changes to suggest appendicitis are seen. Stomach is distended with fluid. Again some gaseous distension of the small bowel is noted likely related to small bowel ileus. Vascular/Lymphatic: Aortic atherosclerosis. No enlarged abdominal or pelvic lymph nodes. Reproductive: Prostate has been surgically removed. Other: Postsurgical changes are noted in the operative bed. Free fluid is noted within the pelvis and extending into the abdomen. This may simply be related to the recent surgery. Some free air is noted consistent with the laparoscopic technique. Air is also noted within the abdominal wall and scrotum again consistent with the recent surgery. Musculoskeletal: No acute or significant osseous findings. IMPRESSION: Free fluid and free air is noted likely related to the recent surgery. Expected postsurgical changes in the operative bed. No sizable hematoma is noted. Some gaseous distension of the colon and small bowel is seen likely representing a postoperative ileus. No true obstructive changes are seen. Electronically Signed   By: Oneil Devonshire M.D.   On: 12/29/2023 02:05   Procedure:  I deflated his catheter balloon and pushed his catheter back in with immediate return of > 1 L of clear urine.  The balloon was reinflated with 15 cc.   Impression/Assessment:  Urinary retention with catheter malpositioned  Plan:  His catheter is now draining well.  Will check a CT to ensure no upper tract obstruction considering the severity of his renal dysfunction.  I would expect his AKI to resolve with proper catheter  drainage.  Will admit and follow renal function.  Noretta Ferrara 12/29/2023, 2:10 AM  Gretel CANDIE Ferrara Teddie MD

## 2023-12-29 NOTE — Telephone Encounter (Signed)
Noted. Patient hospitalized ?

## 2023-12-29 NOTE — TOC Initial Note (Signed)
 Transition of Care [facility]) - Initial/Assessment Note    Patient Details  Name: [name] MRN: [id number] Date of Birth: 1961/05/27  Transition of [facility]) CM/SW Contact:    Doneta Glenys DASEN, RN Phone Number: 12/29/2023, 2:30 PM  Clinical Narrative:                 Presented for acute kidney injury. Recently discharged on 7/30 following robotic radical prostatectomy. PTA lives with spouse [name] [phone] in a house. Verified PCP/insurance;Denies DME,HH, oxygen, and SDOH needs;Patients wife will transport at discharge. CM signing off.   Expected Discharge Plan: Home/Self Care Barriers to Discharge: No Barriers Identified   Patient Goals and CMS Choice Patient states their goals for this hospitalization and ongoing recovery are:: Home with wife CMS Medicare.gov Compare Post Acute Care list provided to::  (NA) Choice offered to / list presented to : NA [place] ownership interest in [facility].provided to:: Parent NA    Expected Discharge Plan and Services In-house Referral: NA Discharge Planning Services: CM Consult Post Acute Care Choice: NA Living arrangements for the past 2 months: Single Family Home Expected Discharge Date: 12/29/23               DME Arranged: N/A DME Agency: NA       HH Arranged: NA HH Agency: NA        Prior Living Arrangements/Services Living arrangements for the past 2 months: Single Family Home Lives with:: Spouse Patient language and need for interpreter reviewed:: Yes Do you feel safe going back to the place where you live?: Yes      Need for Family Participation in Patient Care: No (Comment) Care giver support system in place?: Yes (comment) Current home services:  (NA) Criminal Activity/Legal Involvement Pertinent to Current Situation/Hospitalization: No - Comment as needed  Activities of Daily Living   ADL Screening (condition at time of admission) Independently performs ADLs?: Yes (appropriate for  developmental age) Is the patient deaf or have difficulty hearing?: No Does the patient have difficulty seeing, even when wearing glasses/contacts?: No Does the patient have difficulty concentrating, remembering, or making decisions?: No  Permission Sought/Granted Permission sought to share information with : Case Manager Permission granted to share information with : Yes, Verbal Permission Granted  Share Information with NAME: Drago, Hammonds (Spouse)  [phone]           Emotional Assessment Appearance:: Appears stated age Attitude/Demeanor/Rapport: Engaged Affect (typically observed): Appropriate Orientation: : Oriented to Self, Oriented to Place, Oriented to  Time, Oriented to Situation Alcohol / Substance Use: Not Applicable Psych Involvement: No (comment)  Admission diagnosis:  Acute kidney injury [facility]) [N17.9] Patient Active Problem List   Diagnosis Date Noted   Acute kidney injury (HCC) 12/29/2023   Foot callus 10/27/2023   Onychomycosis 10/27/2023   Palpitations 08/25/2022   Prostate cancer (HCC) 03/06/2019   Family history of colon cancer 10/19/2018   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   Spinal stenosis at L4-L5 level 09/22/2016   AV block    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   Depression 11/19/2015   Primary osteoarthritis of first carpometacarpal joint of left hand 10/23/2015   Chronic renal insufficiency, stage II (mild) 07/02/2014   Coronary artery disease involving native coronary artery of native heart with angina pectoris (HCC)    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/2013   Migraine headache 03/26/2013   GERD (gastroesophageal reflux disease) 11/14/2012   Routine general medical examination at a health care facility 07/28/2012  ERECTILE DYSFUNCTION 08/12/2010   Hyperlipidemia with target LDL less than 70 10/15/2009   TOBACCO USE 10/15/2009   Essential hypertension 10/15/2009   MYOCARDIAL INFARCTION, HX OF 10/15/2009    PCP:  Delbert Clam, MD Pharmacy:   [facility] [phone] [address] [place], [place] - [address] AT [facility] [address] [place] [address] Phone: [phone] Fax: [phone]     Social Drivers of Health (SDOH) Social History: SDOH Screenings   Food Insecurity: No Food Insecurity (12/29/2023)  Recent Concern: Food Insecurity - Food Insecurity Present (12/26/2023)  Housing: Low Risk  (12/29/2023)  Transportation Needs: No Transportation Needs (12/29/2023)  Utilities: Not At Risk (12/29/2023)  Alcohol Screen: Low Risk  (03/09/2023)  Depression (PHQ2-9): Low Risk  (11/08/2023)  Recent Concern: Depression (PHQ2-9) - Medium Risk (10/27/2023)  Financial Resource Strain: Medium Risk (03/09/2023)  Physical Activity: Inactive (03/09/2023)  Social Connections: Moderately Isolated (12/26/2023)  Stress: Stress Concern Present (03/09/2023)  Tobacco Use: Medium Risk (12/28/2023)  Health Literacy: Adequate Health Literacy (03/09/2023)   SDOH Interventions:     Readmission Risk Interventions     No data to display

## 2024-01-02 ENCOUNTER — Telehealth: Payer: Self-pay

## 2024-01-02 NOTE — Transitions of Care (Post Inpatient/ED Visit) (Signed)
   01/02/2024  Name: [name] MRN: [id number] DOB: 10-04-1960  Today's TOC FU Call Status: Today's TOC FU Call Status:: Unsuccessful Call (1st Attempt) Unsuccessful Call (1st Attempt) Date: 01/02/24  Attempted to reach the patient regarding the most recent Inpatient/ED visit.  Follow Up Plan: Additional outreach attempts will be made to reach the patient to complete the Transitions of Care (Post Inpatient/ED visit) call.   Signature  Slater Diesel, RN

## 2024-01-03 ENCOUNTER — Telehealth: Payer: Self-pay

## 2024-01-03 NOTE — Transitions of Care (Post Inpatient/ED Visit) (Signed)
   01/03/2024  Name: Sigmund Morera MRN: [id number] DOB: 28-Sep-1960  Today's TOC FU Call Status: Today's TOC FU Call Status:: Successful TOC FU Call Completed Unsuccessful Call (1st Attempt) Date: 01/02/24 [facility] FU Call Complete Date: 01/03/24 Patient's Name and Date of Birth confirmed.  Transition Care Management Follow-up Telephone Call Date of Discharge: 12/29/23 Discharge Facility: [facility]) Type of Discharge: Inpatient Admission Primary Inpatient Discharge Diagnosis:: Acute Kidney Injury How have you been since you were released from the hospital?: Better (His wife stated he is doing much better. His catheter is draining well and he is not experiencing the pain he had prior to his re-hospitalization) Any questions or concerns?: No  Items Reviewed: Did you receive and understand the discharge instructions provided?: Yes Medications obtained,verified, and reconciled?: Yes (Medications Reviewed) (His wife said they have all of his medications, including the lactuose and nitroglycerin  that Dr Newlin ordered prior to his return to the hospital.  She did not have any questions about the med regime) Any new allergies since your discharge?: No Dietary orders reviewed?: Yes Type of Diet Ordered:: heart healthy Do you have support at home?: Yes People in Home [RPT]: spouse Name of Support/Comfort Primary Source: his wife, [name]  Medications Reviewed Today: Medications Reviewed Today   Medications were not reviewed in this encounter     Home Care and Equipment/Supplies: Were Home Health Services Ordered?: No Any new equipment or medical supplies ordered?: No  Functional Questionnaire: Do you need assistance with bathing/showering or dressing?: No Do you need assistance with meal preparation?: Yes (his wife is assisting with meal prep) Do you need assistance with eating?: No Do you have difficulty maintaining continence: No (He has a foley catheter.) Do you need assistance  with getting out of bed/getting out of a chair/moving?: No (His wife said he has been ambulating independently inside the home as well as outside) Do you have difficulty managing or taking your medications?: Yes (his wife has been assisting)  Follow up appointments reviewed: PCP Follow-up appointment confirmed?: Yes Date of PCP follow-up appointment?: 05/09/24 Follow-up Provider: Dr Delbert. Specialist Hospital Follow-up appointment confirmed?: Yes Date of Specialist follow-up appointment?: 01/05/24 Follow-Up Specialty Provider:: urology Do you need transportation to your follow-up appointment?: No Do you understand care options if your condition(s) worsen?: Yes-patient verbalized understanding    SIGNATURE Slater Diesel, RN

## 2024-01-04 ENCOUNTER — Encounter (HOSPITAL_COMMUNITY): Payer: Self-pay | Admitting: Pharmacy Technician

## 2024-01-04 ENCOUNTER — Emergency Department (HOSPITAL_COMMUNITY): Payer: Self-pay

## 2024-01-04 ENCOUNTER — Other Ambulatory Visit: Payer: Self-pay

## 2024-01-04 ENCOUNTER — Emergency Department (HOSPITAL_COMMUNITY)
Admission: EM | Admit: 2024-01-04 | Discharge: 2024-01-04 | Disposition: A | Discharge status: Home / Self Care | Payer: Self-pay

## 2024-01-04 DIAGNOSIS — G9340 Encephalopathy, unspecified: Secondary | ICD-10-CM

## 2024-01-04 DIAGNOSIS — T839XXA Unspecified complication of genitourinary prosthetic device, implant and graft, initial encounter: Secondary | ICD-10-CM

## 2024-01-04 DIAGNOSIS — W19XXXA Unspecified fall, initial encounter: Secondary | ICD-10-CM

## 2024-01-04 DIAGNOSIS — R109 Unspecified abdominal pain: Secondary | ICD-10-CM | POA: Insufficient documentation

## 2024-01-04 DIAGNOSIS — R4701 Aphasia: Secondary | ICD-10-CM | POA: Insufficient documentation

## 2024-01-04 DIAGNOSIS — T83098A Other mechanical complication of other indwelling urethral catheter, initial encounter: Secondary | ICD-10-CM | POA: Insufficient documentation

## 2024-01-04 DIAGNOSIS — R498 Other voice and resonance disorders: Secondary | ICD-10-CM | POA: Insufficient documentation

## 2024-01-04 DIAGNOSIS — R52 Pain, unspecified: Secondary | ICD-10-CM

## 2024-01-04 DIAGNOSIS — Y732 Prosthetic and other implants, materials and accessory gastroenterology and urology devices associated with adverse incidents: Secondary | ICD-10-CM | POA: Insufficient documentation

## 2024-01-04 DIAGNOSIS — R471 Dysarthria and anarthria: Secondary | ICD-10-CM

## 2024-01-04 DIAGNOSIS — Z8546 Personal history of malignant neoplasm of prostate: Secondary | ICD-10-CM | POA: Insufficient documentation

## 2024-01-04 LAB — CBC
HCT: 29 % — ABNORMAL LOW (ref 39.0–52.0)
Hemoglobin: 9.3 g/dL — ABNORMAL LOW (ref 13.0–17.0)
MCH: 30.7 pg (ref 26.0–34.0)
MCHC: 32.1 g/dL (ref 30.0–36.0)
MCV: 95.7 fL (ref 80.0–100.0)
Platelets: 472 K/uL — ABNORMAL HIGH (ref 150–400)
RBC: 3.03 MIL/uL — ABNORMAL LOW (ref 4.22–5.81)
RDW: 12.7 % (ref 11.5–15.5)
WBC: 7.3 K/uL (ref 4.0–10.5)
nRBC: 0 % (ref 0.0–0.2)

## 2024-01-04 LAB — LIPASE, BLOOD: Lipase: 28 U/L (ref 11–51)

## 2024-01-04 LAB — I-STAT CHEM 8, ED
BUN: 12 mg/dL (ref 8–23)
Calcium, Ion: 1.08 mmol/L — ABNORMAL LOW (ref 1.15–1.40)
Chloride: 99 mmol/L (ref 98–111)
Creatinine, Ser: 1.5 mg/dL — ABNORMAL HIGH (ref 0.61–1.24)
Glucose, Bld: 137 mg/dL — ABNORMAL HIGH (ref 70–99)
HCT: 30 % — ABNORMAL LOW (ref 39.0–52.0)
Hemoglobin: 10.2 g/dL — ABNORMAL LOW (ref 13.0–17.0)
Potassium: 3.4 mmol/L — ABNORMAL LOW (ref 3.5–5.1)
Sodium: 136 mmol/L (ref 135–145)
TCO2: 24 mmol/L (ref 22–32)

## 2024-01-04 LAB — COMPREHENSIVE METABOLIC PANEL WITH GFR
ALT: 11 U/L (ref 0–44)
AST: 15 U/L (ref 15–41)
Albumin: 3.1 g/dL — ABNORMAL LOW (ref 3.5–5.0)
Alkaline Phosphatase: 48 U/L (ref 38–126)
Anion gap: 10 (ref 5–15)
BUN: 12 mg/dL (ref 8–23)
CO2: 23 mmol/L (ref 22–32)
Calcium: 8.5 mg/dL — ABNORMAL LOW (ref 8.9–10.3)
Chloride: 101 mmol/L (ref 98–111)
Creatinine, Ser: 1.54 mg/dL — ABNORMAL HIGH (ref 0.61–1.24)
GFR, Estimated: 51 mL/min — ABNORMAL LOW (ref >60)
Glucose, Bld: 140 mg/dL — ABNORMAL HIGH (ref 70–99)
Potassium: 3.4 mmol/L — ABNORMAL LOW (ref 3.5–5.1)
Sodium: 134 mmol/L — ABNORMAL LOW (ref 135–145)
Total Bilirubin: 0.9 mg/dL (ref 0.0–1.2)
Total Protein: 6.4 g/dL — ABNORMAL LOW (ref 6.5–8.1)

## 2024-01-04 LAB — DIFFERENTIAL
Abs Immature Granulocytes: 0.03 K/uL (ref 0.00–0.07)
Basophils Absolute: 0 K/uL (ref 0.0–0.1)
Basophils Relative: 0 %
Eosinophils Absolute: 0 K/uL (ref 0.0–0.5)
Eosinophils Relative: 1 %
Immature Granulocytes: 0 %
Lymphocytes Relative: 24 %
Lymphs Abs: 1.8 K/uL (ref 0.7–4.0)
Monocytes Absolute: 0.3 K/uL (ref 0.1–1.0)
Monocytes Relative: 4 %
Neutro Abs: 5.2 K/uL (ref 1.7–7.7)
Neutrophils Relative %: 71 %

## 2024-01-04 LAB — CBG MONITORING, ED: Glucose-Capillary: 144 mg/dL — ABNORMAL HIGH (ref 70–99)

## 2024-01-04 LAB — ETHANOL: Alcohol, Ethyl (B): <15 mg/dL (ref <15)

## 2024-01-04 LAB — PROTIME-INR
INR: 1.1 (ref 0.8–1.2)
Prothrombin Time: 14.7 s (ref 11.4–15.2)

## 2024-01-04 LAB — APTT: aPTT: 25 s (ref 24–36)

## 2024-01-04 LAB — I-STAT CG4 LACTIC ACID, ED
Lactic Acid, Venous: 1.8 mmol/L (ref 0.5–1.9)
Lactic Acid, Venous: 1.6 mmol/L (ref 0.5–1.9)

## 2024-01-04 MED ORDER — MORPHINE SULFATE (PF) 4 MG/ML IV SOLN
4.0000 mg | Freq: Once | INTRAVENOUS | Status: AC
  Administered 2024-01-04: 4 mg via INTRAVENOUS
  Filled 2024-01-04: qty 1

## 2024-01-04 MED ORDER — ONDANSETRON HCL 4 MG/2ML IJ SOLN
4.0000 mg | Freq: Once | INTRAMUSCULAR | Status: AC
  Administered 2024-01-04: 4 mg via INTRAVENOUS
  Filled 2024-01-04: qty 2

## 2024-01-04 MED ORDER — PIPERACILLIN-TAZOBACTAM 3.375 G IVPB 30 MIN
3.3750 g | Freq: Once | INTRAVENOUS | Status: AC
  Administered 2024-01-04: 3.375 g via INTRAVENOUS
  Filled 2024-01-04: qty 50

## 2024-01-04 MED ORDER — SODIUM CHLORIDE 0.9% FLUSH
3.0000 mL | Freq: Once | INTRAVENOUS | Status: AC
  Administered 2024-01-04: 3 mL via INTRAVENOUS

## 2024-01-04 MED ORDER — HYDROMORPHONE HCL 1 MG/ML IJ SOLN
1.0000 mg | Freq: Once | INTRAMUSCULAR | Status: AC
  Administered 2024-01-04: 1 mg via INTRAVENOUS
  Filled 2024-01-04: qty 1

## 2024-01-04 MED ORDER — IOHEXOL 350 MG/ML SOLN: 75.0000 mL | Freq: Once | INTRAVENOUS | Status: DC | PRN

## 2024-01-04 MED ORDER — LACTATED RINGERS IV BOLUS
1000.0000 mL | Freq: Once | INTRAVENOUS | Status: AC
  Administered 2024-01-04: 1000 mL via INTRAVENOUS

## 2024-01-04 MED ORDER — IOHEXOL 350 MG/ML SOLN
75.0000 mL | Freq: Once | INTRAVENOUS | Status: AC | PRN
  Administered 2024-01-04: 75 mL via INTRAVENOUS

## 2024-01-04 MED ORDER — GADOBUTROL 1 MMOL/ML IV SOLN
7.0000 mL | Freq: Once | INTRAVENOUS | Status: AC | PRN
  Administered 2024-01-04: 7 mL via INTRAVENOUS

## 2024-01-04 NOTE — Consult Note (Signed)
 Urology Consult   Physician requesting consult: Prentice Medicus, MD  Reason for consult: Prostate cancer s/p prostatectomy, abdominal pain, poor catheter drainage  History of Present Illness: [name] is a 63 y.o. 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  Of note, intraoperatively, he had a negative leak test.  JP drain was removed the following day prior to discharge with seroequivalent creatinine.  He was discharged home on postoperative day 1 and presented on postoperative day 2 in the evening with complaints of poor catheter drainage, abdominal pain, nausea and emesis.  Attempts to irrigate the catheter were unsuccessful.  At that time, catheter was deflated and advanced back into the bladder with immediate return of 1 L clear yellow urine.  Balloon was reinflated with 15 cc sterile water.  He was also found to have AKI with creatinine 5.  CT A/P 12/29/2023 revealed free fluid and air related to recent surgery however expected postoperative surgical changes and bladder decompressed with Foley catheter.  His creatinine down trended that day and he had excellent urine output.  Over the past week, he reports he has had good urine output and this has been recorded safely by his wife.  This morning, she noted some decrease in urine output and patient began to experience abdominal pain.  He was on the way to [facility] urology however patient developed syncopal episode and struck his head.  He was found to be hypotensive.  Initial workup in the ED was unremarkable for stroke with no acute intracranial hemorrhage or pathology of the arteries involving the head and neck.  MRI brain has been ordered.  Of note, CT abdomen  pelvis 01/04/2024 revealed fluid collection in the dependent pelvis measuring 4 x 7 cm as well as Foley catheter in place however balloon appeared to be inferior to the bladder on the left paramedian side.  The bladder was mildly distended.  He is having some abdominal discomfort however this has improved with pain medication.  He reports Foley catheter has continued to drain yellow urine.  Past Medical History:  Diagnosis Date   Apical variant hypertrophic cardiomyopathy (HCC)    CAD (coronary artery disease)    a. LHC in 2011 with severe dz in D1/D2 and very distal LAD--> too small for PCI and managed medically.  b.  LHC in 10/2013 after an abnormal nuclear study w/ non-obst dz/c. 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   HOH (hard of hearing)    rgiht ear   Hyperlipidemia    Hypertension    Myocardial infarction [facility])    Peripheral vascular disease (HCC)    Stroke [facility])     Past Surgical History:  Procedure Laterality Date   CARDIAC CATHETERIZATION N/A 02/24/2016   Procedure: Left Heart Cath and Coronary Angiography;  Surgeon: Ozell Fell, MD;  Location: [facility] INVASIVE CV LAB;  Service:  Cardiovascular;  Laterality: N/A;   CARDIAC CATHETERIZATION N/A 02/24/2016   Procedure: Coronary Stent Intervention;  Surgeon: Ozell Fell, MD;  Location: [facility] INVASIVE CV LAB;  Service: Cardiovascular;  Laterality: N/A;   CORONARY ANGIOPLASTY WITH STENT PLACEMENT  2017   CORONARY BALLOON ANGIOPLASTY N/A 04/30/2019   Procedure: CORONARY BALLOON ANGIOPLASTY;  Surgeon: Swaziland, Peter M, MD;  Location: [facility] INVASIVE CV LAB;  Service: Cardiovascular;   Laterality: N/A;   INGUINAL HERNIA REPAIR Left    LEFT HEART CATH AND CORONARY ANGIOGRAPHY N/A 04/30/2019   Procedure: LEFT HEART CATH AND CORONARY ANGIOGRAPHY;  Surgeon: Cherrie Toribio SAUNDERS, MD;  Location: [facility] INVASIVE CV LAB;  Service: Cardiovascular;  Laterality: N/A;   LEFT HEART CATH AND CORONARY ANGIOGRAPHY N/A 05/14/2019   Procedure: LEFT HEART CATH AND CORONARY ANGIOGRAPHY;  Surgeon: Swaziland, Peter M, MD;  Location: [facility] INVASIVE CV LAB;  Service: Cardiovascular;  Laterality: N/A;   LEFT HEART CATHETERIZATION WITH CORONARY ANGIOGRAM N/A 11/22/2013   Procedure: LEFT HEART CATHETERIZATION WITH CORONARY ANGIOGRAM;  Surgeon: Victory LELON Claudene DOUGLAS, MD;  Location: [facility] CATH LAB;  Service: Cardiovascular;  Laterality: N/A;   ROBOT ASSISTED LAPAROSCOPIC RADICAL PROSTATECTOMY N/A 12/26/2023   Procedure: PROSTATECTOMY, RADICAL, ROBOT-ASSISTED, LAPAROSCOPIC;  Surgeon: Selma Donnice SAUNDERS, MD;  Location: [facility] ORS;  Service: Urology;  Laterality: N/A;   TEE WITHOUT CARDIOVERSION N/A 08/22/2015   Procedure: TRANSESOPHAGEAL ECHOCARDIOGRAM (TEE);  Surgeon: Aleene JINNY Passe, MD;  Location: [facility] ENDOSCOPY;  Service: Cardiovascular;  Laterality: N/A;    Current Hospital Medications:  Home Meds:  No current facility-administered medications on file prior to encounter.   Current Outpatient Medications on File Prior to Encounter  Medication Sig Dispense Refill   amLODipine  (NORVASC ) 10 MG tablet Take 1 tablet (10 mg total) by mouth daily. 90 tablet 1   buPROPion  (WELLBUTRIN  XL) 150 MG 24 hr tablet Take 1 tablet (150 mg total) by mouth daily. For smoking cessation (Patient not taking: Reported on 12/12/2023) 90 tablet 1   docusate sodium  (COLACE) 100 MG capsule Take 1 capsule (100 mg total) by mouth 2 (two) times daily.     DULoxetine  (CYMBALTA ) 60 MG capsule Take 1 capsule (60 mg total) by mouth daily. For chronic pain 90 capsule 1   Evolocumab  (REPATHA  SURECLICK) 140 MG/ML SOAJ INJECT 140 MG INTO THE SKIN EVERY 14 (FOURTEEN)  DAYS. 6 mL 3   HYDROcodone -acetaminophen  (NORCO/VICODIN) 5-325 MG tablet Take 1-2 tablets by mouth every 6 (six) hours as needed for moderate pain (pain score 4-6) or severe pain (pain score 7-10). 20 tablet 0   lactulose  (CHRONULAC ) 10 GM/15ML solution Take 15 mLs (10 g total) by mouth 3 (three) times daily. 946 mL 0   losartan -hydrochlorothiazide  (HYZAAR ) 100-25 MG tablet Take 1 tablet by mouth daily. 90 tablet 1   nitroGLYCERIN  (NITROSTAT ) 0.4 MG SL tablet Place 1 tablet (0.4 mg total) under the tongue every 5 (five) minutes as needed for chest pain. 25 tablet 3   ranolazine  (RANEXA ) 500 MG 12 hr tablet Take 1 tablet (500 mg total) by mouth 2 (two) times daily. 180 tablet 1   rosuvastatin  (CRESTOR ) 40 MG tablet Take 1 tablet (40 mg total) by mouth daily. 90 tablet 1   sulfamethoxazole -trimethoprim  (BACTRIM  DS) 800-160 MG tablet Take 1 tablet by mouth 2 (two) times daily. Start the day prior to foley removal appointment 6 tablet 0   terbinafine  (LAMISIL ) 250 MG tablet Take 1 tablet (250 mg total) by mouth daily. Come by [facility] for labs (liver function)  to continue therapy. (Patient not taking: Reported on 12/12/2023) 30 tablet 0     Scheduled Meds:   HYDROmorphone  (DILAUDID ) injection  1 mg Intravenous Once   ondansetron  (ZOFRAN ) IV  4 mg Intravenous Once   Continuous Infusions:  lactated ringers      PRN Meds:.iohexol   Allergies:  Allergies  Allergen Reactions   Lisinopril Swelling    lips swelling    Family History  Problem Relation Age of Onset   Hypertension Mother    Heart failure Mother    Hypertension Father    Colon cancer Father 58   Hypertension Brother    Heart disease Son    Heart disease Son    Colon cancer Paternal Uncle     Social History:  reports that he has quit smoking. His smoking use included cigarettes. He has never used smokeless tobacco. He reports that he does not drink alcohol and does not use drugs.  ROS: A complete review of  systems was performed.  All systems are negative except for pertinent findings as noted.  Physical Exam:  Vital signs in last 24 hours: Temp:  [98.2 F (36.8 C)-98.4 F (36.9 C)] 98.4 F (36.9 C) (08/06 1325) Pulse Rate:  [91-114] 91 (08/06 1325) Resp:  [18-23] 18 (08/06 1325) BP: (106-113)/(71-96) 112/71 (08/06 1325) SpO2:  [100 %] 100 % (08/06 1325) Weight:  [69.2 kg] 69.2 kg (08/06 0900) Constitutional:  Alert and oriented, No acute distress Cardiovascular: Regular rate and rhythm Respiratory: Normal respiratory effort, Lungs clear bilaterally GI: Abdomen is soft, nontender, nondistended, incisions clean dry intact GU: No CVA tenderness; Foley catheter in place with yellow urine in the bag Neurologic: Grossly intact, no focal deficits Psychiatric: Normal mood and affect  Laboratory Data:  Recent Labs    01/04/24 0952 01/04/24 0953  WBC 7.3  --   HGB 9.3* 10.2*  HCT 29.0* 30.0*  PLT 472*  --     Recent Labs    01/04/24 0952 01/04/24 0953  NA 134* 136  K 3.4* 3.4*  CL 101 99  GLUCOSE 140* 137*  BUN 12 12  CALCIUM  8.5*  --   CREATININE 1.54* 1.50*     Results for orders placed or performed during the hospital encounter of 01/04/24 (from the past 24 hours)  CBG monitoring, ED     Status: Abnormal   Collection Time: 01/04/24  9:48 AM  Result Value Ref Range   Glucose-Capillary 144 (H) 70 - 99 mg/dL  Protime-INR     Status: None   Collection Time: 01/04/24  9:52 AM  Result Value Ref Range   Prothrombin Time 14.7 11.4 - 15.2 seconds   INR 1.1 0.8 - 1.2  APTT     Status: None   Collection Time: 01/04/24  9:52 AM  Result Value Ref Range   aPTT 25 24 - 36 seconds  CBC     Status: Abnormal   Collection Time: 01/04/24  9:52 AM  Result Value Ref Range   WBC 7.3 4.0 - 10.5 K/uL   RBC 3.03 (L) 4.22 - 5.81 MIL/uL   Hemoglobin 9.3 (L) 13.0 - 17.0 g/dL   HCT 70.9 (L) 60.9 - 47.9 %   MCV 95.7 80.0 - 100.0 fL   MCH 30.7 26.0 - 34.0 pg   MCHC 32.1 30.0 - 36.0 g/dL    RDW 87.2 88.4 - 84.4 %   Platelets 472 (H) 150 - 400 K/uL   nRBC 0.0 0.0 - 0.2 %  Differential  Status: None   Collection Time: 01/04/24  9:52 AM  Result Value Ref Range   Neutrophils Relative % 71 %   Neutro Abs 5.2 1.7 - 7.7 K/uL   Lymphocytes Relative 24 %   Lymphs Abs 1.8 0.7 - 4.0 K/uL   Monocytes Relative 4 %   Monocytes Absolute 0.3 0.1 - 1.0 K/uL   Eosinophils Relative 1 %   Eosinophils Absolute 0.0 0.0 - 0.5 K/uL   Basophils Relative 0 %   Basophils Absolute 0.0 0.0 - 0.1 K/uL   Immature Granulocytes 0 %   Abs Immature Granulocytes 0.03 0.00 - 0.07 K/uL  Comprehensive metabolic panel     Status: Abnormal   Collection Time: 01/04/24  9:52 AM  Result Value Ref Range   Sodium 134 (L) 135 - 145 mmol/L   Potassium 3.4 (L) 3.5 - 5.1 mmol/L   Chloride 101 98 - 111 mmol/L   CO2 23 22 - 32 mmol/L   Glucose, Bld 140 (H) 70 - 99 mg/dL   BUN 12 8 - 23 mg/dL   Creatinine, Ser 8.45 (H) 0.61 - 1.24 mg/dL   Calcium  8.5 (L) 8.9 - 10.3 mg/dL   Total Protein 6.4 (L) 6.5 - 8.1 g/dL   Albumin  3.1 (L) 3.5 - 5.0 g/dL   AST 15 15 - 41 U/L   ALT 11 0 - 44 U/L   Alkaline Phosphatase 48 38 - 126 U/L   Total Bilirubin 0.9 0.0 - 1.2 mg/dL   GFR, Estimated 51 (L) >60 mL/min   Anion gap 10 5 - 15  Ethanol     Status: None   Collection Time: 01/04/24  9:52 AM  Result Value Ref Range   Alcohol, Ethyl (B) <15 <15 mg/dL  I-stat chem 8, ED     Status: Abnormal   Collection Time: 01/04/24  9:53 AM  Result Value Ref Range   Sodium 136 135 - 145 mmol/L   Potassium 3.4 (L) 3.5 - 5.1 mmol/L   Chloride 99 98 - 111 mmol/L   BUN 12 8 - 23 mg/dL   Creatinine, Ser 8.49 (H) 0.61 - 1.24 mg/dL   Glucose, Bld 862 (H) 70 - 99 mg/dL   Calcium , Ion 1.08 (L) 1.15 - 1.40 mmol/L   TCO2 24 22 - 32 mmol/L   Hemoglobin 10.2 (L) 13.0 - 17.0 g/dL   HCT 69.9 (L) 60.9 - 47.9 %  Lipase, blood     Status: None   Collection Time: 01/04/24  9:53 AM  Result Value Ref Range   Lipase 28 11 - 51 U/L  I-Stat CG4  Lactic Acid     Status: None   Collection Time: 01/04/24 10:34 AM  Result Value Ref Range   Lactic Acid, Venous 1.8 0.5 - 1.9 mmol/L  I-Stat CG4 Lactic Acid     Status: None   Collection Time: 01/04/24  1:09 PM  Result Value Ref Range   Lactic Acid, Venous 1.6 0.5 - 1.9 mmol/L   No results found for this or any previous visit (from the past 240 hours).  Renal Function: Recent Labs    12/28/23 2344 12/29/23 0738 12/29/23 1245 01/04/24 0952 01/04/24 0953  CREATININE 5.25* 3.03* 2.30* 1.54* 1.50*   Estimated Creatinine Clearance: 49.4 mL/min (A) (by C-G formula based on SCr of 1.5 mg/dL (H)).  Radiologic Imaging: CT C-SPINE NO CHARGE Result Date: 01/04/2024 CLINICAL DATA:  Aphasia EXAM: CT CERVICAL SPINE WITHOUT CONTRAST TECHNIQUE: Multidetector CT imaging of the cervical spine was performed without  intravenous contrast. Multiplanar CT image reconstructions were also generated. RADIATION DOSE REDUCTION: This exam was performed according to the departmental dose-optimization program which includes automated exposure control, adjustment of the mA and/or kV according to patient size and/or use of iterative reconstruction technique. COMPARISON:  None Available. FINDINGS: Alignment: Reversal of the normal cervical lordosis, likely degenerative in nature. 2 mm anterolisthesis C7-T1, degenerative in nature. Skull base and vertebrae: Radius cervical alignment is normal. The atlantodental interval is not widened. No acute fracture of the cervical spine. Vertebral body height is preserved. Soft tissues and spinal canal: No prevertebral fluid or swelling. No visible canal hematoma. Findings are correlated concurrently performed CT arteriogram the neck. 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 Disc levels: There is diffuse intervertebral disc space narrowing and endplate remodeling throughout the cervical spine in keeping with changes of diffuse severe degenerative disc disease. Prevertebral soft tissues are not thickened on sagittal reformats. Multilevel uncovertebral and facet arthrosis results in multilevel moderate to severe neuroforaminal narrowing, most severe on the right at C4-5, C5-6, and C6-7 on the left at C2-3 and bilaterally C7-T1. Upper chest: Negative. Other: None IMPRESSION: 1. No acute fracture or listhesis of the cervical spine. 2. 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 This would be better assessed with MRI examination. 3. Multilevel uncovertebral and facet arthrosis results in multilevel moderate to severe neuroforaminal narrowing, most severe on the right at C4-5, C5-6, and C6-7 on the left at C2-3 and bilaterally C7-T1. Electronically Signed   By: Dorethia Molt M.D.   On: 01/04/2024 12:10   CT ABDOMEN PELVIS W CONTRAST Result Date: 01/04/2024 CLINICAL DATA:  Abdominal pain, acute, nonlocalized h/o prostate ca resection last week, abd pain. * Tracking Code: BO * EXAM: CT ABDOMEN AND PELVIS WITH CONTRAST TECHNIQUE: Multidetector CT imaging of the abdomen and pelvis was performed using the standard protocol following bolus administration of intravenous contrast. RADIATION DOSE REDUCTION: This exam was performed according to the departmental dose-optimization program which includes automated exposure control, adjustment of the mA and/or kV according to patient size and/or use of  iterative reconstruction technique. CONTRAST:  75mL OMNIPAQUE  IOHEXOL  350 MG/ML SOLN COMPARISON:  CT scan abdomen and pelvis from 12/29/2023. FINDINGS: Lower chest: The lung bases are clear. No pleural effusion. The heart is normal in size. No pericardial effusion. Hepatobiliary: The liver is normal in size. Non-cirrhotic configuration. No suspicious mass. No intrahepatic or extrahepatic bile duct dilation. No calcified gallstones. Normal gallbladder wall thickness. No pericholecystic inflammatory changes. Pancreas: Unremarkable. No pancreatic ductal dilatation or surrounding inflammatory changes. Spleen: Within normal limits. No focal lesion. Adrenals/Urinary Tract: Adrenal glands are unremarkable. No suspicious renal mass. There is a 6 x 12 mm cortical cyst in the right kidney lower pole. No nephroureterolithiasis or obstructive uropathy. Urinary bladder is partially distended despite a Foley catheter. The balloon of the Foley catheter appears inferior to the urinary bladder and is on the left paramedian side. Correlate clinically and  urine out for optimal positioning. Stomach/Bowel: No disproportionate dilation of the small or large bowel loops. There is mild circumferential thickening of several distal small bowel loops in the right mid/lower abdomen, which are nonspecific but can be seen with focal enteritis. Vascular/Lymphatic: There is an approximately 4.2 x 7.0 cm sized fluid in the dependent pelvis, with thin hyperattenuating walls. Findings may represent postoperative seroma versus abscess. Correlate clinically. Small amount of free intraperitoneal air noted along the lower midline anterior abdominal wall and along the left inguinal canal, likely from recent surgery. There is additional trace ascites mainly in the perisplenic region, significantly decreased since the prior study. No abdominal or pelvic lymphadenopathy, by size criteria. No aneurysmal dilation of the major abdominal arteries. There are  moderate peripheral atherosclerotic vascular calcifications of the aorta and its major branches. Reproductive: There is provided history of recent prostate resection. Other: Supraumbilical midline surgical scar noted. The soft tissues and abdominal wall are otherwise unremarkable. Musculoskeletal: No suspicious osseous lesions. There are mild multilevel degenerative changes in the visualized spine. IMPRESSION: 1. Recent prostatectomy. There is an approximately 4.2 x 7.0 cm sized fluid collection in the dependent pelvis with thin hyperattenuating walls. Findings may represent postoperative seroma versus abscess. Correlate clinically. 2. There is mild circumferential thickening of several distal small bowel loops in the right mid/lower abdomen, which are nonspecific but can be seen with focal enteritis. 3. There is a Foley catheter in-situ however, the balloon appears inferior to the urinary bladder and on the left paramedian side. Correlate clinically with urine out to determine positioning. 4. Multiple other nonacute observations, as described above. Aortic Atherosclerosis (ICD10-I70.0). Electronically Signed   By: Ree Molt M.D.   On: 01/04/2024 10:59   CT ANGIO HEAD NECK W WO CM (CODE STROKE) Result Date: 01/04/2024 EXAM: CTA Head and Neck with Intravenous Contrast. CT Head without Contrast. CLINICAL HISTORY: Stroke, follow up. Aphasia. Code stroke. Dr. Arora. TECHNIQUE: Axial CTA images of the head and neck performed with intravenous contrast. MIP reconstructed images were created and reviewed. Axial computed tomography images of the head/brain performed without intravenous contrast. Note: Per PQRS, the description of internal carotid artery percent stenosis, including 0 percent or normal exam, is based on [place] American Symptomatic Carotid Endarterectomy Trial (NASCET) criteria. Dose reduction technique was used including one or more of the following: automated exposure control, adjustment of mA and kV  according to patient size, and/or iterative reconstruction. CONTRAST: 75 mL of iohexol  (OMNIPAQUE ) 350 MG/ML injection. COMPARISON: Head CT 01/04/2024 and brain MRI 11/26/2015. FINDINGS: CT HEAD: BRAIN: No acute intraparenchymal hemorrhage. No mass lesion. No CT evidence for acute territorial infarct. No midline shift or extra-axial collection. Patchy supratentorial white matter hypoattenuation is unchanged compared to the earlier head CT and compatible with the findings of the MRI from 11/26/2015. VENTRICLES: No hydrocephalus. ORBITS: The orbits are unremarkable. SINUSES AND MASTOIDS: The paranasal sinuses and mastoid air cells are clear. CTA NECK: COMMON CAROTID ARTERIES: No significant stenosis. No dissection or occlusion. INTERNAL CAROTID ARTERIES: No stenosis by NASCET criteria. No dissection or occlusion. VERTEBRAL ARTERIES: Left vertebral artery arises independently from the aortic arch, a normal variant. No significant stenosis. No dissection or occlusion. CTA HEAD: ANTERIOR CEREBRAL ARTERIES: No significant stenosis. No occlusion. No aneurysm. MIDDLE CEREBRAL ARTERIES: No significant stenosis. No occlusion. No aneurysm. POSTERIOR CEREBRAL ARTERIES: No significant stenosis. No occlusion. No aneurysm. BASILAR ARTERY: No significant stenosis. No occlusion. No aneurysm. OTHER: Calcific aortic atherosclerosis. SOFT TISSUES: No acute finding. No masses or lymphadenopathy.  BONES: No acute osseous abnormality. IMPRESSION: 1. No acute intracranial hemorrhage 2. No evidence of significant stenosis, aneurysmal dilatation, or dissection involving the arteries of the head and neck. 3. Unchanged patchy supratentorial white matter hypoattenuation, compatible with prior MRI findings from 11/26/15. Electronically signed by: Franky Stanford MD 01/04/2024 10:25 AM EDT RP Workstation: [id number]   CT HEAD CODE STROKE WO CONTRAST Result Date: 01/04/2024 EXAM: CT HEAD WITHOUT 01/04/2024 09:55:51 AM TECHNIQUE: CT of the head was  performed without the administration of intravenous contrast. Automated exposure control, iterative reconstruction, and/or weight based adjustment of the mA/kV was utilized to reduce the radiation dose to as low as reasonably achievable. COMPARISON: MRI head dated 02/03/2026. CLINICAL HISTORY: Neuro deficit, acute, stroke suspected. Aphasia, code stroke, dr deedra. FINDINGS: BRAIN AND VENTRICLES: No acute intracranial hemorrhage. No mass effect or midline shift. No extra-axial fluid collection. Gray-white differentiation is maintained. No hydrocephalus. Multiple scattered areas of hypoattenuation of the subcortical white matter corresponding to findings on prior MRI. ORBITS: No acute abnormality. SINUSES AND MASTOIDS: No acute abnormality. SOFT TISSUES AND SKULL: No acute skull fracture. No acute soft tissue abnormality. IMPRESSION: 1. No acute intracranial abnormality. ASPECTS is 10. 2. Multiple scattered areas of hypoattenuation in the subcortical white matter, corresponding to findings on prior MRI. 3. Findings messaged to Dr. Arora via the [facility] messaging system at 10:03 AM on 01/04/24. Electronically signed by: Donnice Mania MD 01/04/2024 10:05 AM EDT RP Workstation: [id number]    I independently reviewed the above imaging studies.  Procedure note: Bedside ultrasound was placed on the abdomen and I visualized the distended bladder.  Foley catheter balloon appeared to be withdrawn with Foley balloon in the left lateral aspect of the pelvis.  I deflated the balloon by removing 15 cc sterile water. - Next, his penis was prepped and draped in standard sterile fashion.  I introduced a 74 French cystoscope into the urethra encountering no abnormalities in urethra to the level of the membranous urethra.  Following this, advanced into the members urethra and there was significant debris present.  Initially I did not think I was in the bladder I could not visualize mucosa.  This appeared to be a walled off fluid  collection.  I advanced and encountered bladder mucosa.  Again, there was debris present making visualization difficult.  Competent I was now in the bladder, I passed a 0.038 sensor wire into the bladder and over this wire, passed an [address] [place] council catheter.  This was hubbed at his penis and there was drainage of about 300 mL clear yellow urine.  The balloon was inflated with 15 cc sterile water.  Impression/Recommendation Prostate cancer s/p robotic assisted laparoscopic radical prostatectomy and bilateral lymph node dissection on 12/26/2023 with [id number] GG2 disease Abdominal pain Poor catheter drainage  -As above, I was able to advance catheter under cystoscopy guidance over wire into the bladder.  This will be left to gravity drainage.  He was initially scheduled for cystogram tomorrow however we will leave foley catheter for at least 2 more weeks prior to attempt at cystogram.  Instructions given to patient to leave catheter hubbed near penis and is secured with a stat lock.  Discussed cystoscopy findings with patient and I am hopeful that his anastomosis will heal with Foley catheter conservative management alone.  He knows to call if he has any difficulty with catheter drainage.  Discussed that in rare cases, if anastomosis does not heal, this may require operative revision.  Matt R. Nicolemarie Wooley MD 01/04/2024,  2:36 PM  [facility]  Pager: [phone]

## 2024-01-04 NOTE — ED Notes (Signed)
 Both sets of cultures obtained prior to abx infusion start.

## 2024-01-04 NOTE — ED Triage Notes (Signed)
 Pt bib ems from home as a code stroke from home. Pt LKW 0845. This morning pt was up and awake complaining of abdominal pain. Pt had procedure on his prostate on 7/28. For the last few days pt has had tea colored urine output from his catheter. Pt also complained of feeling near syncopal. Pt stood up and passed out, striking his head. After that, pt found to be aphasic. Pt has not had Plavix  since the 23rd of July. Initially hypotensive 86/30, given fluid bolus with EMS.

## 2024-01-04 NOTE — ED Provider Notes (Signed)
 [place] EMERGENCY DEPARTMENT AT [facility] Provider Note   CSN: [id number] Arrival date & time: 01/04/24  [id number]  An emergency department physician performed an initial assessment on this suspected stroke patient at [phone].  Patient presents with: Code Stroke   [name] is a 63 y.o. male.   63 year old male with recent prostatectomy on 12/28/2023 presenting to the emergency department today with concerns for aphasia came on acutely today.  The patient has been having some abdominal pain apparently.  He did have the prostatectomy on the 30th.  The patient does have some hypophonia as well here.  Denies any fevers.  Reports abdominal pain but denies any chest pain.        Prior to Admission medications   Medication Sig Start Date End Date Taking? Authorizing Provider  amLODipine  (NORVASC ) 10 MG tablet Take 1 tablet (10 mg total) by mouth daily. 08/23/23  Yes Newlin, Enobong, MD  docusate sodium  (COLACE) 100 MG capsule Take 1 capsule (100 mg total) by mouth 2 (two) times daily. 12/26/23  Yes Dancy, Alan, PA-C  DULoxetine  (CYMBALTA ) 60 MG capsule Take 1 capsule (60 mg total) by mouth daily. For chronic pain 11/08/23  Yes Delbert Clam, MD  Evolocumab  (REPATHA  SURECLICK) 140 MG/ML SOAJ INJECT 140 MG INTO THE SKIN EVERY 14 (FOURTEEN) DAYS. 09/26/23  Yes Chandrasekhar, Mahesh A, MD  HYDROcodone -acetaminophen  (NORCO/VICODIN) 5-325 MG tablet Take 1-2 tablets by mouth every 6 (six) hours as needed for moderate pain (pain score 4-6) or severe pain (pain score 7-10). 12/26/23  Yes Dancy, Alan, PA-C  lactulose  (CHRONULAC ) 10 GM/15ML solution Take 15 mLs (10 g total) by mouth 3 (three) times daily. 12/29/23  Yes Newlin, Enobong, MD  losartan -hydrochlorothiazide  (HYZAAR ) 100-25 MG tablet Take 1 tablet by mouth daily. 11/08/23  Yes Newlin, Enobong, MD  nitroGLYCERIN  (NITROSTAT ) 0.4 MG SL tablet Place 1 tablet (0.4 mg total) under the tongue every 5 (five) minutes as needed for chest  pain. 12/29/23  Yes Newlin, Enobong, MD  ranolazine  (RANEXA ) 500 MG 12 hr tablet Take 1 tablet (500 mg total) by mouth 2 (two) times daily. 08/23/23  Yes Newlin, Enobong, MD  rosuvastatin  (CRESTOR ) 40 MG tablet Take 1 tablet (40 mg total) by mouth daily. 08/23/23  Yes Newlin, Enobong, MD    Allergies: Lisinopril    Review of Systems  Gastrointestinal:  Positive for abdominal pain.  Neurological:  Positive for speech difficulty.  All other systems reviewed and are negative.   Updated Vital Signs BP 112/71   Pulse 91   Temp 98.4 F (36.9 C) (Oral)   Resp 18   Ht 5' 8 (1.727 m)   Wt 69.2 kg   SpO2 100%   BMI 23.20 kg/m   Physical Exam Vitals and nursing note reviewed.   Gen: NAD Eyes: PERRL, EOMI HEENT: no oropharyngeal swelling Neck: trachea midline Resp: clear to auscultation bilaterally Card: Tachycardic, no murmurs, rubs, or gallops Abd: Diffusely tender with no guarding or rebound Extremities: no calf tenderness, no edema Vascular: 2+ radial pulses bilaterally, 2+ DP pulses bilaterally Neuro: Mild expressive aphasia with hypophonia noted, please see NIH stroke scale from stroke team who evaluated the patient on my initial assessment Skin: no rashes Psyc: acting appropriately   (all labs ordered are listed, but only abnormal results are displayed) Labs Reviewed  CBC - Abnormal; Notable for the following components:      Result Value   RBC 3.03 (*)    Hemoglobin 9.3 (*)    HCT 29.0 (*)  Platelets 472 (*)    All other components within normal limits  COMPREHENSIVE METABOLIC PANEL WITH GFR - Abnormal; Notable for the following components:   Sodium 134 (*)    Potassium 3.4 (*)    Glucose, Bld 140 (*)    Creatinine, Ser 1.54 (*)    Calcium  8.5 (*)    Total Protein 6.4 (*)    Albumin  3.1 (*)    GFR, Estimated 51 (*)    All other components within normal limits  I-STAT CHEM 8, ED - Abnormal; Notable for the following components:   Potassium 3.4 (*)     Creatinine, Ser 1.50 (*)    Glucose, Bld 137 (*)    Calcium , Ion 1.08 (*)    Hemoglobin 10.2 (*)    HCT 30.0 (*)    All other components within normal limits  CBG MONITORING, ED - Abnormal; Notable for the following components:   Glucose-Capillary 144 (*)    All other components within normal limits  CULTURE, BLOOD (ROUTINE X 2)  CULTURE, BLOOD (ROUTINE X 2)  PROTIME-INR  APTT  DIFFERENTIAL  ETHANOL  LIPASE, BLOOD  I-STAT CG4 LACTIC ACID, ED  I-STAT CG4 LACTIC ACID, ED    EKG: EKG Interpretation Date/Time:  Wednesday January 04 2024 10:16:46 EDT Ventricular Rate:  117 PR Interval:  167 QRS Duration:  76 QT Interval:  328 QTC Calculation: 458 R Axis:   84  Text Interpretation: Sinus tachycardia Right atrial enlargement Probable anterior infarct, age indeterminate Confirmed by Ula Barter [phone]) on 01/04/2024 10:32:05 AM  Radiology: MR Brain W and Wo Contrast Result Date: 01/04/2024 EXAM: MRI BRAIN WITH AND WITHOUT CONTRAST 01/04/2024 03:45:23 PM TECHNIQUE: Multiplanar multisequence MRI of the head/brain was performed with and without the administration of intravenous contrast. COMPARISON: CT head and CTA head and neck earlier the same day. MRI head dated 11/26/2015. CLINICAL HISTORY: Metastatic disease evaluation. FINDINGS: BRAIN AND VENTRICLES: Scattered areas of T2/FLAIR hyperintensity in the supratentorial white matter, predominantly within the subcortical white matter, which is mildly increased compared to the MRI from 2017. No abnormal enhancement of the brain parenchyma. No acute infarct. No acute intracranial hemorrhage. No mass effect or midline shift. No hydrocephalus. The sella is unremarkable. Normal flow voids. No mass. ORBITS: No acute abnormality. SINUSES: No acute abnormality. BONES AND SOFT TISSUES: Normal bone marrow signal and enhancement. No acute soft tissue abnormality. IMPRESSION: 1. No acute intracranial abnormality. 2. Scattered areas of signal abnormality  primarily in the subcortical white matter, mildly increased compared to the MRI from 2017. Findings likely reflect chronic microvascular ischemic changes versus other vasculopathies. 3. No abnormal enhancement. Electronically signed by: Donnice Mania MD 01/04/2024 04:04 PM EDT RP Workstation: [id number]   CT C-SPINE NO CHARGE Result Date: 01/04/2024 CLINICAL DATA:  Aphasia EXAM: CT CERVICAL SPINE WITHOUT CONTRAST TECHNIQUE: Multidetector CT imaging of the cervical spine was performed without intravenous contrast. Multiplanar CT image reconstructions were also generated. RADIATION DOSE REDUCTION: This exam was performed according to the departmental dose-optimization program which includes automated exposure control, adjustment of the mA and/or kV according to patient size and/or use of iterative reconstruction technique. COMPARISON:  None Available. FINDINGS: Alignment: Reversal of the normal cervical lordosis, likely degenerative in nature. 2 mm anterolisthesis C7-T1, degenerative in nature. Skull base and vertebrae: Radius cervical alignment is normal. The atlantodental interval is not widened. No acute fracture of the cervical spine. Vertebral body height is preserved. Soft tissues and spinal canal: No prevertebral fluid or swelling. No visible canal hematoma. Findings  are correlated concurrently performed CT arteriogram the neck. 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 Disc levels: There is diffuse intervertebral disc space narrowing and endplate remodeling throughout the cervical spine in keeping with changes of diffuse severe degenerative disc disease. Prevertebral soft tissues are not thickened on  sagittal reformats. Multilevel uncovertebral and facet arthrosis results in multilevel moderate to severe neuroforaminal narrowing, most severe on the right at C4-5, C5-6, and C6-7 on the left at C2-3 and bilaterally C7-T1. Upper chest: Negative. Other: None IMPRESSION: 1. No acute fracture or listhesis of the cervical spine. 2. 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 This would be better assessed with MRI examination. 3. Multilevel uncovertebral and facet arthrosis results in multilevel moderate to severe neuroforaminal narrowing, most severe on the right at C4-5, C5-6, and C6-7 on the left at C2-3 and bilaterally C7-T1. Electronically Signed   By: Dorethia Molt M.D.   On: 01/04/2024 12:10   CT ABDOMEN PELVIS W CONTRAST Result Date: 01/04/2024 CLINICAL DATA:  Abdominal pain, acute, nonlocalized h/o prostate ca resection last week, abd pain. * Tracking Code: BO * EXAM: CT ABDOMEN AND PELVIS WITH CONTRAST TECHNIQUE: Multidetector CT imaging of the abdomen and pelvis was performed using the standard protocol following bolus administration of intravenous contrast. RADIATION DOSE REDUCTION: This exam was performed according to the departmental dose-optimization program which includes automated exposure control, adjustment of the mA and/or kV according to patient size and/or use of iterative reconstruction technique. CONTRAST:  75mL OMNIPAQUE  IOHEXOL  350 MG/ML SOLN COMPARISON:  CT scan abdomen and pelvis from 12/29/2023. FINDINGS: Lower chest: The lung bases are clear. No pleural effusion. The heart is normal in size. No pericardial effusion. Hepatobiliary: The liver is normal in size. Non-cirrhotic configuration. No  suspicious mass. No intrahepatic or extrahepatic bile duct dilation. No calcified gallstones. Normal gallbladder wall thickness. No pericholecystic inflammatory changes. Pancreas: Unremarkable. No pancreatic ductal dilatation or surrounding inflammatory changes. Spleen: Within normal limits. No focal lesion. Adrenals/Urinary Tract: Adrenal glands are unremarkable. No suspicious renal mass. There is a 6 x 12 mm cortical cyst in the right kidney lower pole. No nephroureterolithiasis or obstructive uropathy. Urinary bladder is partially distended despite a Foley catheter. The balloon of the Foley catheter appears inferior to the urinary bladder and is on the left paramedian side. Correlate clinically and urine out for optimal positioning. Stomach/Bowel: No disproportionate dilation of the small or large bowel loops. There is mild circumferential thickening of several distal small bowel loops in the right mid/lower abdomen, which are nonspecific but can be seen with focal enteritis. Vascular/Lymphatic: There is an approximately 4.2 x 7.0 cm sized fluid in the dependent pelvis, with thin hyperattenuating walls. Findings may represent postoperative seroma versus abscess. Correlate clinically. Small amount of free intraperitoneal air noted along the lower midline anterior abdominal wall and along the left inguinal canal, likely from recent surgery. There is additional trace ascites mainly in the perisplenic region, significantly decreased since the  prior study. No abdominal or pelvic lymphadenopathy, by size criteria. No aneurysmal dilation of the major abdominal arteries. There are moderate peripheral atherosclerotic vascular calcifications of the aorta and its major branches. Reproductive: There is provided history of recent prostate resection. Other: Supraumbilical midline surgical scar noted. The soft tissues and abdominal wall are otherwise unremarkable. Musculoskeletal: No suspicious osseous lesions. There are mild  multilevel degenerative changes in the visualized spine. IMPRESSION: 1. Recent prostatectomy. There is an approximately 4.2 x 7.0 cm sized fluid collection in the dependent pelvis with thin hyperattenuating walls. Findings may represent postoperative seroma versus abscess. Correlate clinically. 2. There is mild circumferential thickening of several distal small bowel loops in the right mid/lower abdomen, which are nonspecific but can be seen with focal enteritis. 3. There is a Foley catheter in-situ however, the balloon appears inferior to the urinary bladder and on the left paramedian side. Correlate clinically with urine out to determine positioning. 4. Multiple other nonacute observations, as described above. Aortic Atherosclerosis (ICD10-I70.0). Electronically Signed   By: Ree Molt M.D.   On: 01/04/2024 10:59   CT ANGIO HEAD NECK W WO CM (CODE STROKE) Result Date: 01/04/2024 EXAM: CTA Head and Neck with Intravenous Contrast. CT Head without Contrast. CLINICAL HISTORY: Stroke, follow up. Aphasia. Code stroke. Dr. Arora. TECHNIQUE: Axial CTA images of the head and neck performed with intravenous contrast. MIP reconstructed images were created and reviewed. Axial computed tomography images of the head/brain performed without intravenous contrast. Note: Per PQRS, the description of internal carotid artery percent stenosis, including 0 percent or normal exam, is based on [place] American Symptomatic Carotid Endarterectomy Trial (NASCET) criteria. Dose reduction technique was used including one or more of the following: automated exposure control, adjustment of mA and kV according to patient size, and/or iterative reconstruction. CONTRAST: 75 mL of iohexol  (OMNIPAQUE ) 350 MG/ML injection. COMPARISON: Head CT 01/04/2024 and brain MRI 11/26/2015. FINDINGS: CT HEAD: BRAIN: No acute intraparenchymal hemorrhage. No mass lesion. No CT evidence for acute territorial infarct. No midline shift or extra-axial collection.  Patchy supratentorial white matter hypoattenuation is unchanged compared to the earlier head CT and compatible with the findings of the MRI from 11/26/2015. VENTRICLES: No hydrocephalus. ORBITS: The orbits are unremarkable. SINUSES AND MASTOIDS: The paranasal sinuses and mastoid air cells are clear. CTA NECK: COMMON CAROTID ARTERIES: No significant stenosis. No dissection or occlusion. INTERNAL CAROTID ARTERIES: No stenosis by NASCET criteria. No dissection or occlusion. VERTEBRAL ARTERIES: Left vertebral artery arises independently from the aortic arch, a normal variant. No significant stenosis. No dissection or occlusion. CTA HEAD: ANTERIOR CEREBRAL ARTERIES: No significant stenosis. No occlusion. No aneurysm. MIDDLE CEREBRAL ARTERIES: No significant stenosis. No occlusion. No aneurysm. POSTERIOR CEREBRAL ARTERIES: No significant stenosis. No occlusion. No aneurysm. BASILAR ARTERY: No significant stenosis. No occlusion. No aneurysm. OTHER: Calcific aortic atherosclerosis. SOFT TISSUES: No acute finding. No masses or lymphadenopathy. BONES: No acute osseous abnormality. IMPRESSION: 1. No acute intracranial hemorrhage 2. No evidence of significant stenosis, aneurysmal dilatation, or dissection involving the arteries of the head and neck. 3. Unchanged patchy supratentorial white matter hypoattenuation, compatible with prior MRI findings from 11/26/15. Electronically signed by: Franky Stanford MD 01/04/2024 10:25 AM EDT RP Workstation: [id number]   CT HEAD CODE STROKE WO CONTRAST Result Date: 01/04/2024 EXAM: CT HEAD WITHOUT 01/04/2024 09:55:51 AM TECHNIQUE: CT of the head was performed without the administration of intravenous contrast. Automated exposure control, iterative reconstruction, and/or weight based adjustment of the mA/kV was utilized to reduce the radiation dose to as  low as reasonably achievable. COMPARISON: MRI head dated 02/03/2026. CLINICAL HISTORY: Neuro deficit, acute, stroke suspected. Aphasia, code  stroke, dr deedra. FINDINGS: BRAIN AND VENTRICLES: No acute intracranial hemorrhage. No mass effect or midline shift. No extra-axial fluid collection. Gray-white differentiation is maintained. No hydrocephalus. Multiple scattered areas of hypoattenuation of the subcortical white matter corresponding to findings on prior MRI. ORBITS: No acute abnormality. SINUSES AND MASTOIDS: No acute abnormality. SOFT TISSUES AND SKULL: No acute skull fracture. No acute soft tissue abnormality. IMPRESSION: 1. No acute intracranial abnormality. ASPECTS is 10. 2. Multiple scattered areas of hypoattenuation in the subcortical white matter, corresponding to findings on prior MRI. 3. Findings messaged to Dr. Arora via the [facility] messaging system at 10:03 AM on 01/04/24. Electronically signed by: Donnice Mania MD 01/04/2024 10:05 AM EDT RP Workstation: [id number]     Procedures   Medications Ordered in the ED  iohexol  (OMNIPAQUE ) 350 MG/ML injection 75 mL (has no administration in time range)  lactated ringers  bolus 1,000 mL (has no administration in time range)  sodium chloride  flush (NS) 0.9 % injection 3 mL (3 mLs Intravenous Given 01/04/24 1042)  iohexol  (OMNIPAQUE ) 350 MG/ML injection 75 mL (75 mLs Intravenous Contrast Given 01/04/24 1008)  morphine  (PF) 4 MG/ML injection 4 mg (4 mg Intravenous Given 01/04/24 1037)  ondansetron  (ZOFRAN ) injection 4 mg (4 mg Intravenous Given 01/04/24 1037)  lactated ringers  bolus 1,000 mL (0 mLs Intravenous Stopped 01/04/24 1149)  piperacillin -tazobactam (ZOSYN ) IVPB 3.375 g (3.375 g Intravenous New Bag/Given 01/04/24 1259)  HYDROmorphone  (DILAUDID ) injection 1 mg (1 mg Intravenous Given 01/04/24 1422)  ondansetron  (ZOFRAN ) injection 4 mg (4 mg Intravenous Given 01/04/24 1422)  gadobutrol  (GADAVIST ) 1 MMOL/ML injection 7 mL (7 mLs Intravenous Contrast Given 01/04/24 1545)                                    Medical Decision Making 63 year old male with past medical history of prostate cancer status  post prostatectomy presenting to the emergency department today with acute onset aphasia and hypophonia.  The patient did have some dizziness as well.  He was made a code stroke for this reason.  Will initiate stroke workup here.  Will also obtain a CT scan of his abdomen after CT angiogram of his head and neck to evaluate for postsurgical problems.  Will give the patient morphine  Zofran  for his symptoms as well as some IV fluids.  He has mildly tachycardic here on arrival.  Will continue to monitor.  Discussed the patient's case with Dr. deedra after his initial CT scans were performed.  These did not show any obvious acute findings.  Recommended MRI with and without contrast to evaluate for metastatic lesions.  This is ordered.  Creatinine does seem to be improved compared to most recent hospital stay with creatinine 1.5.  The patient was evaluated by urology here.  They are concerned about the placement the patient's catheter and replaced this year.  They recommended outpatient follow-up for the patient and replace his Foley catheter in the emergency department.  The fluid collection on the CT was felt to be a seroma rather than abscess.  MRI results are pending at the time of signout.  Plan is for reevaluation and follow-up pending urology recommendations.  CRITICAL CARE Performed by: Prentice JONELLE Medicus   Total critical care time: 35 minutes  Critical care time was exclusive of separately billable procedures and treating other patients.  Critical care was necessary to treat or prevent imminent or life-threatening deterioration.  Critical care was time spent personally by me on the following activities: development of treatment plan with patient and/or surrogate as well as nursing, discussions with consultants, evaluation of patient's response to treatment, examination of patient, obtaining history from patient or surrogate, ordering and performing treatments and interventions, ordering and review of  laboratory studies, ordering and review of radiographic studies, pulse oximetry and re-evaluation of patient's condition.   Amount and/or Complexity of Data Reviewed Labs: ordered. Radiology: ordered.  Risk Prescription drug management.        Final diagnoses:  Abdominal pain, unspecified abdominal location  Problem with Foley catheter, initial encounter [facility])  Dysarthria    ED Discharge Orders     None          Ula Prentice SAUNDERS, MD 01/04/24 [id number]

## 2024-01-04 NOTE — Consult Note (Signed)
 NEUROLOGY CONSULT NOTE   Date of service: January 04, 2024 Patient Name: [name] MRN:  [id number] DOB:  08/03/1960 Chief Complaint: code stroke Requesting Provider: Ula Prentice SAUNDERS, MD  History of Present Illness  [name] is a 63 y.o. male with hx of coronary artery disease, prostate cancer status post laparoscopic radical prostatectomy and laparoscopic bilateral pelvic lymph node dissection done on 12/26/2023 presents for evaluation of aphasia. EMS reported that they were called because he was feeling lightheaded and dizzy and then stopped talking.  He was awake and was able to nod yes and no but was nonverbal for them.  He had a fall where he hit his head to the floor. He reports extreme pain-10/10 in intensity in his lower abdomen and pelvic region.   LKW: 0845 Modified rankin score: 0-Completely asymptomatic and back to baseline post- stroke IV Thrombolysis: Non focal exam EVT: No LVO NIH stroke scale 5    ROS  Comprehensive ROS performed and pertinent positives documented in HPI    Past History   Past Medical History:  Diagnosis Date   Apical variant hypertrophic cardiomyopathy (HCC)    CAD (coronary artery disease)    a. LHC in 2011 with severe dz in D1/D2 and very distal LAD--> too small for PCI and managed medically.  b.  LHC in 10/2013 after an abnormal nuclear study w/ non-obst dz/c. 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   HOH (hard of hearing)    rgiht ear   Hyperlipidemia    Hypertension    Myocardial infarction [facility])    Peripheral vascular disease (HCC)    Stroke [facility])     Past  Surgical History:  Procedure Laterality Date   CARDIAC CATHETERIZATION N/A 02/24/2016   Procedure: Left Heart Cath and Coronary Angiography;  Surgeon: Ozell Fell, MD;  Location: [facility] INVASIVE CV LAB;  Service: Cardiovascular;  Laterality: N/A;   CARDIAC CATHETERIZATION N/A 02/24/2016   Procedure: Coronary Stent Intervention;  Surgeon: Ozell Fell, MD;  Location: [facility] INVASIVE CV LAB;  Service: Cardiovascular;  Laterality: N/A;   CORONARY ANGIOPLASTY WITH STENT PLACEMENT  2017   CORONARY BALLOON ANGIOPLASTY N/A 04/30/2019   Procedure: CORONARY BALLOON ANGIOPLASTY;  Surgeon: Swaziland, Peter M, MD;  Location: [facility] INVASIVE CV LAB;  Service: Cardiovascular;  Laterality: N/A;   INGUINAL HERNIA REPAIR Left    LEFT HEART CATH AND CORONARY ANGIOGRAPHY N/A 04/30/2019   Procedure: LEFT HEART CATH AND CORONARY ANGIOGRAPHY;  Surgeon: Cherrie Toribio SAUNDERS, MD;  Location: [facility] INVASIVE CV LAB;  Service: Cardiovascular;  Laterality: N/A;   LEFT HEART CATH AND CORONARY ANGIOGRAPHY N/A 05/14/2019   Procedure: LEFT HEART CATH AND CORONARY ANGIOGRAPHY;  Surgeon: Swaziland, Peter M, MD;  Location: [facility] INVASIVE CV LAB;  Service: Cardiovascular;  Laterality: N/A;   LEFT HEART CATHETERIZATION WITH CORONARY ANGIOGRAM N/A 11/22/2013   Procedure: LEFT HEART CATHETERIZATION WITH CORONARY ANGIOGRAM;  Surgeon: Victory LELON Claudene DOUGLAS, MD;  Location: [facility] CATH LAB;  Service: Cardiovascular;  Laterality: N/A;   ROBOT ASSISTED LAPAROSCOPIC RADICAL PROSTATECTOMY N/A 12/26/2023   Procedure: PROSTATECTOMY, RADICAL, ROBOT-ASSISTED,  LAPAROSCOPIC;  Surgeon: Selma Donnice SAUNDERS, MD;  Location: [facility] ORS;  Service: Urology;  Laterality: N/A;   TEE WITHOUT CARDIOVERSION N/A 08/22/2015   Procedure: TRANSESOPHAGEAL ECHOCARDIOGRAM (TEE);  Surgeon: Aleene JINNY Passe, MD;  Location: [facility] ENDOSCOPY;  Service: Cardiovascular;  Laterality: N/A;    Family History: Family History  Problem Relation Age of Onset   Hypertension Mother    Heart failure Mother     Hypertension Father    Colon cancer Father 58   Hypertension Brother    Heart disease Son    Heart disease Son    Colon cancer Paternal Uncle     Social History  reports that he has quit smoking. His smoking use included cigarettes. He has never used smokeless tobacco. He reports that he does not drink alcohol and does not use drugs.  Allergies  Allergen Reactions   Lisinopril Swelling    lips swelling    Medications   Current Facility-Administered Medications:    sodium chloride  flush (NS) 0.9 % injection 3 mL, 3 mL, Intravenous, Once, Ula Prentice SAUNDERS, MD  Current Outpatient Medications:    amLODipine  (NORVASC ) 10 MG tablet, Take 1 tablet (10 mg total) by mouth daily., Disp: 90 tablet, Rfl: 1   buPROPion  (WELLBUTRIN  XL) 150 MG 24 hr tablet, Take 1 tablet (150 mg total) by mouth daily. For smoking cessation (Patient not taking: Reported on 12/12/2023), Disp: 90 tablet, Rfl: 1   docusate sodium  (COLACE) 100 MG capsule, Take 1 capsule (100 mg total) by mouth 2 (two) times daily., Disp: , Rfl:    DULoxetine  (CYMBALTA ) 60 MG capsule, Take 1 capsule (60 mg total) by mouth daily. For chronic pain, Disp: 90 capsule, Rfl: 1   Evolocumab  (REPATHA  SURECLICK) 140 MG/ML SOAJ, INJECT 140 MG INTO THE SKIN EVERY 14 (FOURTEEN) DAYS., Disp: 6 mL, Rfl: 3   HYDROcodone -acetaminophen  (NORCO/VICODIN) 5-325 MG tablet, Take 1-2 tablets by mouth every 6 (six) hours as needed for moderate pain (pain score 4-6) or severe pain (pain score 7-10)., Disp: 20 tablet, Rfl: 0   lactulose  (CHRONULAC ) 10 GM/15ML solution, Take 15 mLs (10 g total) by mouth 3 (three) times daily., Disp: 946 mL, Rfl: 0   losartan -hydrochlorothiazide  (HYZAAR ) 100-25 MG tablet, Take 1 tablet by mouth daily., Disp: 90 tablet, Rfl: 1   nitroGLYCERIN  (NITROSTAT ) 0.4 MG SL tablet, Place 1 tablet (0.4 mg total) under the tongue every 5 (five) minutes as needed for chest pain., Disp: 25 tablet, Rfl: 3   ranolazine  (RANEXA ) 500 MG 12 hr tablet, Take  1 tablet (500 mg total) by mouth 2 (two) times daily., Disp: 180 tablet, Rfl: 1   rosuvastatin  (CRESTOR ) 40 MG tablet, Take 1 tablet (40 mg total) by mouth daily., Disp: 90 tablet, Rfl: 1   sulfamethoxazole -trimethoprim  (BACTRIM  DS) 800-160 MG tablet, Take 1 tablet by mouth 2 (two) times daily. Start the day prior to foley removal appointment, Disp: 6 tablet, Rfl: 0   terbinafine  (LAMISIL ) 250 MG tablet, Take 1 tablet (250 mg total) by mouth daily. Come by [facility] for labs (liver function) to continue therapy. (Patient not taking: Reported on 12/12/2023), Disp: 30 tablet, Rfl: 0  Vitals   Vitals:   01/04/24 0900  Weight: 69.2 kg  Height: 5' 8 (1.727 m)    Body mass index is 23.2 kg/m.   Physical Exam  General: Awake alert in some distress due to pain. HEENT: Normocephalic, atraumatic Lungs: Clear Cardiovascular: Regular rhythm Abdomen: Clean wounds from the robotic laparoscopic assisted surgeries.  Neurological exam Awake alert.  Oriented to self and nodded yes when I asked his name was or next. Appropriately nodding yes and no Verbalized his name after coaching-hypophonic. No dysarthria Cranial nerves: Pupils: React light, extraocular movements intact, visual fields full, face appears symmetric, facial sensation intact, tongue and palate midline. Motor examination with no drift in the upper extremities bilaterally.  Both lower extremity extremity pain limited exam-drift to bed. Sensation intact to light touch No gross dysmetria  Labs/Imaging/Neurodiagnostic studies   CBC:  Recent Labs  Lab 01-03-24 2344  WBC 16.6*  NEUTROABS 15.4*  HGB 10.9*  HCT 33.2*  MCV 93.5  PLT 339   Basic Metabolic Panel:  Lab Results  Component Value Date   NA 135 12/29/2023   K 3.8 12/29/2023   CO2 29 12/29/2023   GLUCOSE 117 (H) 12/29/2023   BUN 31 (H) 12/29/2023   CREATININE 2.30 (H) 12/29/2023   CALCIUM  8.9 12/29/2023   GFRNONAA 31 (L) 12/29/2023   GFRAA 69  05/07/2020   Lipid Panel:  Lab Results  Component Value Date   LDLCALC 32 06/23/2022   HgbA1c:  Lab Results  Component Value Date   HGBA1C 5.3 04/29/2019   Urine Drug Screen:     Component Value Date/Time   LABOPIA NONE DETECTED 03/26/2013 0105   COCAINSCRNUR NONE DETECTED 03/26/2013 0105   LABBENZ NONE DETECTED 03/26/2013 0105   AMPHETMU NONE DETECTED 03/26/2013 0105   THCU NONE DETECTED 03/26/2013 0105   LABBARB NONE DETECTED 03/26/2013 0105    Alcohol Level     Component Value Date/Time   ETH <11 03/26/2013 0025   INR  Lab Results  Component Value Date   INR 1.0 05/12/2019   APTT  Lab Results  Component Value Date   APTT 30 04/28/2019   CT Head without contrast(Personally reviewed): No acute changes  CT angio Head and Neck with contrast(Personally reviewed): No emergent large vessel occlusion  CT of the neck/C-spine-pending CT abdomen pelvis-completed and read pending.  ASSESSMENT   [name] is a 63 y.o. male with recent prostatectomy and pelvic lymph node dissection for prostate cancer presents for evaluation of lightheadedness followed by what sounds like a syncopal episode where he hit his head to the ground and after that had difficulty speaking although the patient was able to nod yes and no appropriately to questions and on coaching was able to tell me his name although in a very hypophonic tone. His exam was very limited by pain.  He reported 10 on 10 pain. I suspect that this is a presentation of encephalopathy secondary to severe pain rather than stroke as I could not see any focal weakness or localize my exam to 1 particular vascular territory.  Impression: Encephalopathy-unspecified, likely related to pain.  RECOMMENDATIONS  Given h/o prostate ca -- MRI brain w+without contrast-if negative, no further stroke workup I would recommend abdominal imaging which I have already ordered in has been completed-ER to follow. Medical management per  ER  Addendum MRI brain with and without contrast with no evidence of stroke or mets Scattered areas of signal abnormality in the subcortical white matter mildly increased from 2017-likely chronic microvascular ischemic changes. No further inpatient workup from a neurological standpoint. CT abdomen pelvis with concern for postoperative seroma versus abscess.  Urology following.  Follow recommendations.  Plan discussed with Dr. Ula and Dr. Mannie.  ______________________________________________________________________    Bonney Eligio Lav, MD [facility]  Neurohospitalist

## 2024-01-04 NOTE — ED Notes (Signed)
 Bladder scan shows 113 in bladder. Pt with approx 200cc urine output since 0800 today. MD aware.

## 2024-01-04 NOTE — ED Provider Notes (Signed)
  Physical Exam  BP 118/63   Pulse 95   Temp 97.9 F (36.6 C) (Oral)   Resp 19   Ht 5' 8 (1.727 m)   Wt 69.2 kg   SpO2 100%   BMI 23.20 kg/m   Physical Exam  Procedures  Procedures  ED Course / MDM    Medical Decision Making Amount and/or Complexity of Data Reviewed Labs: ordered. Radiology: ordered.  Risk Prescription drug management.   LILLETTE Fairy Gravely, assumed care for this patient.  Brief 63 year old male, came in as a possible code stroke, however appeared to be more severe pain due to a malpositioned catheter.  Urology came and placed catheter correctly, patient's pain significantly improved, he is now having no pain.  Urology did not have any additional recommendations.  Patient was signed out pending MRI of cervical spine which fortunately was negative.  He now feels fine, wants to go home.  Discussed return precautions with the patient at bedside.  Will discharge.       Gravely Fairy T, DO 01/04/24 1702

## 2024-01-04 NOTE — ED Notes (Signed)
 CCMD called and notified

## 2024-01-04 NOTE — Code Documentation (Signed)
 Stroke Response Nurse Documentation Code Documentation  [name] is a 63 y.o. male arriving to [facility]  via [facility] on 01/04/2024 with past medical hx of recent prostatectomy, CAD, CKD, cardiomyopathy. On No antithrombotic. Code stroke was activated by EMS.   Patient from home where he was LKW at 0845 and now complaining of aphasia.Per EMS, this morning pt fainted, and hit his head. He has been having trouble speaking and headache since that time.  Stroke team at the bedside on patient arrival. Labs drawn and patient cleared for CT by Dr. Ula. Patient to CT with team. NIHSS 5, see documentation for details and code stroke times. Patient with bilateral leg weakness and Expressive aphasia  on exam. The following imaging was completed:  CT Head and CTA. Patient is not a candidate for IV Thrombolytic due to recent surgery. Patient is not a candidate for IR due to LVO negative CTA.   Care Plan: VS, HIHSS q 2 x 12, then q 4.    Bedside handoff with ED RN Camelia.    Shanon Richardson Colt  Stroke Response RN

## 2024-01-04 NOTE — ED Notes (Signed)
 Patient transported to MRI

## 2024-01-04 NOTE — Discharge Instructions (Signed)
 Please follow-up with Dr. Selma as scheduled.  Return to the emergency department for worsening symptoms.

## 2024-01-04 NOTE — Progress Notes (Signed)
 ED Pharmacy Antibiotic Sign Off An antibiotic consult was received from an ED provider for zosyn  per pharmacy dosing for intra abdominal infection. A chart review was completed to assess appropriateness.   The following one time order(s) were placed:  Zosyn  3.375G IV x1  Further antibiotic and/or antibiotic pharmacy consults should be ordered by the admitting provider if indicated.   Thank you for allowing pharmacy to be a part of this patient's care.   Koren LITTIE Or, [facility]  Clinical Pharmacist 01/04/24 12:49 PM

## 2024-01-04 NOTE — ED Notes (Signed)
 Patient Alert and oriented to baseline. Stable and ambulatory to baseline. Patient verbalized understanding of the discharge instructions.  Patient belongings were taken by the patient.

## 2024-01-05 LAB — BLOOD CULTURE ID PANEL (REFLEXED) - BCID2

## 2024-01-05 NOTE — ED Notes (Signed)
 Pt called with no answer. VM left to call [facility] charge nurse back at [phone] regarding positive blood culture.

## 2024-01-06 ENCOUNTER — Telehealth (HOSPITAL_COMMUNITY): Payer: Self-pay | Admitting: Pharmacist

## 2024-01-06 ENCOUNTER — Other Ambulatory Visit: Payer: Self-pay

## 2024-01-06 ENCOUNTER — Encounter (HOSPITAL_COMMUNITY): Payer: Self-pay

## 2024-01-06 ENCOUNTER — Emergency Department (HOSPITAL_COMMUNITY): Payer: Self-pay

## 2024-01-06 ENCOUNTER — Inpatient Hospital Stay (HOSPITAL_COMMUNITY)
Admission: EM | Admit: 2024-01-06 | Discharge: 2024-01-12 | DRG: 856 | Disposition: A | Discharge status: Home / Self Care | Payer: Self-pay | Attending: Internal Medicine | Admitting: Internal Medicine

## 2024-01-06 DIAGNOSIS — T8143XA Infection following a procedure, organ and space surgical site, initial encounter: Secondary | ICD-10-CM | POA: Diagnosis present

## 2024-01-06 DIAGNOSIS — R7881 Bacteremia: Secondary | ICD-10-CM | POA: Diagnosis present

## 2024-01-06 DIAGNOSIS — N9989 Other postprocedural complications and disorders of genitourinary system: Secondary | ICD-10-CM | POA: Diagnosis present

## 2024-01-06 DIAGNOSIS — K660 Peritoneal adhesions (postprocedural) (postinfection): Secondary | ICD-10-CM | POA: Diagnosis present

## 2024-01-06 DIAGNOSIS — Z955 Presence of coronary angioplasty implant and graft: Secondary | ICD-10-CM

## 2024-01-06 DIAGNOSIS — N179 Acute kidney failure, unspecified: Secondary | ICD-10-CM | POA: Diagnosis present

## 2024-01-06 DIAGNOSIS — I10 Essential (primary) hypertension: Secondary | ICD-10-CM | POA: Diagnosis present

## 2024-01-06 DIAGNOSIS — D649 Anemia, unspecified: Secondary | ICD-10-CM | POA: Diagnosis present

## 2024-01-06 DIAGNOSIS — Z79899 Other long term (current) drug therapy: Secondary | ICD-10-CM

## 2024-01-06 DIAGNOSIS — Z888 Allergy status to other drugs, medicaments and biological substances status: Secondary | ICD-10-CM | POA: Diagnosis not present

## 2024-01-06 DIAGNOSIS — I252 Old myocardial infarction: Secondary | ICD-10-CM

## 2024-01-06 DIAGNOSIS — I739 Peripheral vascular disease, unspecified: Secondary | ICD-10-CM | POA: Diagnosis present

## 2024-01-06 DIAGNOSIS — Z8249 Family history of ischemic heart disease and other diseases of the circulatory system: Secondary | ICD-10-CM

## 2024-01-06 DIAGNOSIS — C61 Malignant neoplasm of prostate: Secondary | ICD-10-CM | POA: Diagnosis present

## 2024-01-06 DIAGNOSIS — E785 Hyperlipidemia, unspecified: Secondary | ICD-10-CM | POA: Diagnosis present

## 2024-01-06 DIAGNOSIS — B9620 Unspecified Escherichia coli [E. coli] as the cause of diseases classified elsewhere: Secondary | ICD-10-CM

## 2024-01-06 DIAGNOSIS — I2510 Atherosclerotic heart disease of native coronary artery without angina pectoris: Secondary | ICD-10-CM | POA: Diagnosis present

## 2024-01-06 DIAGNOSIS — R6520 Severe sepsis without septic shock: Secondary | ICD-10-CM | POA: Diagnosis present

## 2024-01-06 DIAGNOSIS — I422 Other hypertrophic cardiomyopathy: Secondary | ICD-10-CM | POA: Diagnosis present

## 2024-01-06 DIAGNOSIS — Z8673 Personal history of transient ischemic attack (TIA), and cerebral infarction without residual deficits: Secondary | ICD-10-CM | POA: Diagnosis not present

## 2024-01-06 DIAGNOSIS — K6811 Postprocedural retroperitoneal abscess: Secondary | ICD-10-CM | POA: Diagnosis present

## 2024-01-06 DIAGNOSIS — Y832 Surgical operation with anastomosis, bypass or graft as the cause of abnormal reaction of the patient, or of later complication, without mention of misadventure at the time of the procedure: Secondary | ICD-10-CM | POA: Diagnosis present

## 2024-01-06 DIAGNOSIS — T8144XA Sepsis following a procedure, initial encounter: Secondary | ICD-10-CM | POA: Diagnosis present

## 2024-01-06 DIAGNOSIS — F17210 Nicotine dependence, cigarettes, uncomplicated: Secondary | ICD-10-CM | POA: Diagnosis present

## 2024-01-06 DIAGNOSIS — Z5986 Financial insecurity: Secondary | ICD-10-CM | POA: Diagnosis not present

## 2024-01-06 DIAGNOSIS — A419 Sepsis, unspecified organism: Secondary | ICD-10-CM | POA: Diagnosis present

## 2024-01-06 DIAGNOSIS — N390 Urinary tract infection, site not specified: Secondary | ICD-10-CM | POA: Diagnosis present

## 2024-01-06 DIAGNOSIS — A4151 Sepsis due to Escherichia coli [E. coli]: Secondary | ICD-10-CM | POA: Diagnosis present

## 2024-01-06 DIAGNOSIS — I959 Hypotension, unspecified: Secondary | ICD-10-CM | POA: Diagnosis present

## 2024-01-06 DIAGNOSIS — Z800 Family history of malignant neoplasm of digestive organs: Secondary | ICD-10-CM | POA: Diagnosis not present

## 2024-01-06 DIAGNOSIS — Z9079 Acquired absence of other genital organ(s): Secondary | ICD-10-CM

## 2024-01-06 LAB — BASIC METABOLIC PANEL WITH GFR
Anion gap: 12 (ref 5–15)
BUN: 27 mg/dL — ABNORMAL HIGH (ref 8–23)
CO2: 24 mmol/L (ref 22–32)
Calcium: 9 mg/dL (ref 8.9–10.3)
Chloride: 101 mmol/L (ref 98–111)
Creatinine, Ser: 1.67 mg/dL — ABNORMAL HIGH (ref 0.61–1.24)
GFR, Estimated: 46 mL/min — ABNORMAL LOW (ref >60)
Glucose, Bld: 90 mg/dL (ref 70–99)
Potassium: 3.7 mmol/L (ref 3.5–5.1)
Sodium: 137 mmol/L (ref 135–145)

## 2024-01-06 LAB — HEPATIC FUNCTION PANEL
ALT: 13 U/L (ref 0–44)
AST: 19 U/L (ref 15–41)
Albumin: 2.7 g/dL — ABNORMAL LOW (ref 3.5–5.0)
Alkaline Phosphatase: 49 U/L (ref 38–126)
Bilirubin, Direct: 0.1 mg/dL (ref 0.0–0.2)
Indirect Bilirubin: 0.7 mg/dL (ref 0.3–0.9)
Total Bilirubin: 0.8 mg/dL (ref 0.0–1.2)
Total Protein: 6.4 g/dL — ABNORMAL LOW (ref 6.5–8.1)

## 2024-01-06 LAB — CBC WITH DIFFERENTIAL/PLATELET
Abs Immature Granulocytes: 0.1 K/uL — ABNORMAL HIGH (ref 0.00–0.07)
Basophils Absolute: 0 K/uL (ref 0.0–0.1)
Basophils Relative: 0 %
Eosinophils Absolute: 0 K/uL (ref 0.0–0.5)
Eosinophils Relative: 0 %
HCT: 24.6 % — ABNORMAL LOW (ref 39.0–52.0)
Hemoglobin: 7.9 g/dL — ABNORMAL LOW (ref 13.0–17.0)
Immature Granulocytes: 1 %
Lymphocytes Relative: 4 %
Lymphs Abs: 0.7 K/uL (ref 0.7–4.0)
MCH: 30.2 pg (ref 26.0–34.0)
MCHC: 32.1 g/dL (ref 30.0–36.0)
MCV: 93.9 fL (ref 80.0–100.0)
Monocytes Absolute: 0.5 K/uL (ref 0.1–1.0)
Monocytes Relative: 3 %
Neutro Abs: 16.7 K/uL — ABNORMAL HIGH (ref 1.7–7.7)
Neutrophils Relative %: 92 %
Platelets: 474 K/uL — ABNORMAL HIGH (ref 150–400)
RBC: 2.62 MIL/uL — ABNORMAL LOW (ref 4.22–5.81)
RDW: 12.8 % (ref 11.5–15.5)
WBC: 18 K/uL — ABNORMAL HIGH (ref 4.0–10.5)
nRBC: 0 % (ref 0.0–0.2)

## 2024-01-06 LAB — HIV ANTIBODY (ROUTINE TESTING W REFLEX): HIV Screen 4th Generation wRfx: NONREACTIVE

## 2024-01-06 LAB — LACTIC ACID, PLASMA
Lactic Acid, Venous: 0.9 mmol/L (ref 0.5–1.9)
Lactic Acid, Venous: 0.9 mmol/L (ref 0.5–1.9)

## 2024-01-06 MED ORDER — ENSURE PLUS HIGH PROTEIN PO LIQD
237.0000 mL | Freq: Two times a day (BID) | ORAL | Status: DC
  Administered 2024-01-10 (×4): 237 mL via ORAL

## 2024-01-06 MED ORDER — ORAL CARE MOUTH RINSE
15.0000 mL | OROMUCOSAL | Status: DC | PRN
Start: 2024-01-06 — End: 2024-01-12

## 2024-01-06 MED ORDER — DULOXETINE HCL 30 MG PO CPEP
60.0000 mg | ORAL_CAPSULE | Freq: Every day | ORAL | Status: DC
  Administered 2024-01-06 – 2024-01-12 (×10): 60 mg via ORAL
  Filled 2024-01-06 (×7): qty 2

## 2024-01-06 MED ORDER — VANCOMYCIN HCL 1500 MG/300ML IV SOLN
1500.0000 mg | Freq: Once | INTRAVENOUS | Status: AC
  Administered 2024-01-06: 1500 mg via INTRAVENOUS
  Filled 2024-01-06 (×2): qty 300

## 2024-01-06 MED ORDER — SODIUM CHLORIDE 0.9 % IV SOLN
2.0000 g | INTRAVENOUS | Status: DC
  Administered 2024-01-07 – 2024-01-09 (×4): 2 g via INTRAVENOUS
  Filled 2024-01-06 (×2): qty 20

## 2024-01-06 MED ORDER — PANTOPRAZOLE SODIUM 40 MG IV SOLR
40.0000 mg | Freq: Two times a day (BID) | INTRAVENOUS | Status: DC
  Administered 2024-01-06: 40 mg via INTRAVENOUS
  Filled 2024-01-06: qty 10

## 2024-01-06 MED ORDER — MORPHINE SULFATE (PF) 4 MG/ML IV SOLN
4.0000 mg | Freq: Once | INTRAVENOUS | Status: AC
  Administered 2024-01-06: 4 mg via INTRAVENOUS
  Filled 2024-01-06: qty 1

## 2024-01-06 MED ORDER — VANCOMYCIN VARIABLE DOSE PER UNSTABLE RENAL FUNCTION (PHARMACIST DOSING): Status: DC

## 2024-01-06 MED ORDER — IOHEXOL 350 MG/ML SOLN
75.0000 mL | Freq: Once | INTRAVENOUS | Status: AC | PRN
  Administered 2024-01-06: 75 mL via INTRAVENOUS

## 2024-01-06 MED ORDER — ROSUVASTATIN CALCIUM 20 MG PO TABS
40.0000 mg | ORAL_TABLET | Freq: Every day | ORAL | Status: DC
Start: 2024-01-06 — End: 2024-01-12
  Administered 2024-01-06 – 2024-01-12 (×10): 40 mg via ORAL
  Filled 2024-01-06 (×7): qty 2

## 2024-01-06 MED ORDER — ONDANSETRON HCL 4 MG/2ML IJ SOLN
4.0000 mg | Freq: Once | INTRAMUSCULAR | Status: AC
  Administered 2024-01-06: 4 mg via INTRAVENOUS
  Filled 2024-01-06: qty 2

## 2024-01-06 MED ORDER — CHLORHEXIDINE GLUCONATE CLOTH 2 % EX PADS
6.0000 | MEDICATED_PAD | Freq: Every day | CUTANEOUS | Status: DC
  Administered 2024-01-06: 6 via TOPICAL

## 2024-01-06 MED ORDER — LACTATED RINGERS IV BOLUS
1000.0000 mL | Freq: Once | INTRAVENOUS | Status: AC
  Administered 2024-01-06: 1000 mL via INTRAVENOUS

## 2024-01-06 MED ORDER — SODIUM CHLORIDE 0.9 % IV SOLN
2.0000 g | Freq: Once | INTRAVENOUS | Status: AC
  Administered 2024-01-06: 2 g via INTRAVENOUS
  Filled 2024-01-06: qty 20

## 2024-01-06 MED ORDER — LACTATED RINGERS IV BOLUS (SEPSIS)
1000.0000 mL | Freq: Once | INTRAVENOUS | Status: AC
  Administered 2024-01-06: 1000 mL via INTRAVENOUS

## 2024-01-06 NOTE — Progress Notes (Signed)
 Telephone call made to patient and spoke with spouse [name].  Reported to her that he has positive blood culture and needs to return to the ED for assessment and antibiotic treatment.  Recommend starting ceftriaxone  2g IV q24h for his Ecoli bacteremia.  Venetia Gully, PharmD, BCIDP Clinical Pharmacist Phone [phone]

## 2024-01-06 NOTE — H&P (Addendum)
 History and Physical    Patient: [name] FMW:[id number] DOB: 1960-11-01 DOA: 01/06/2024 DOS: the patient was seen and examined on 01/06/2024 . PCP: Delbert Clam, MD  Patient coming from: Home Chief complaint: Chief Complaint  Patient presents with   Blood Infection  HPI: [name] is a 63 y.o. year old patient with prostate cancer who underwent RALP with b/l PLND on 12/26/2023 and discharged on 12/27/2023. Day after discharge patient called in for intractable pain that was unrelieved with Norco patient at that time also reported that his Foley was not draining as much urine.  Patient also reported nausea and vomiting, and was advised to come back to the hospital and was seen in the emergency room.  Patient was seen in the ED on 28 December 2023 and creatinine at that point was 5.25 was evaluated by urology and was found to have mild positioned Foley and urinary retention and was discharged the following day.  Patient was doing fair and again on 6 August 2 days ago patient came in for abdominal pain aphasia with reports of not feeling well.  Patient apparently stood up at home and passed out and did strike his head and then thereafter was found to have trouble speaking, patient was also at that time noted to be hypotensive with systolic of 86/30 and was given a fluid bolus by EMS.  Code stroke was activated.  Patient at that time had reported bilateral leg weakness and was not a candidate for IV thrombolytic's due to recent surgery.  Was found to have a negative CTA for LVO.  At that time patient had blood cultures collected.  Of note, CT abdomen pelvis 01/04/2024 revealed fluid collection in the dependent pelvis measuring 4 x 7 cm as well as Foley catheter in place however balloon appeared to be inferior to the bladder on the left paramedian side. The bladder was mildly distended.  Foley did continue to drain.  Patient had an MRI which was negative and was discharged home. Patient today on  January 06, 2024 was called and asked to come to the emergency room for positive blood cultures with E. coli and sensitivities Pending. Admission requested for same to [facility] for sepsis/UTI and intra-abdominal fluid collection that is increasing in size.   ED Course:  Vital signs in the ED were notable for the following:  Vitals:   01/06/24 1500 01/06/24 1515 01/06/24 1530 01/06/24 1545  BP: 131/70 114/73 127/72 134/74  Pulse: 84 81 78   Temp:      Resp: 20 20 18 20   Height:      Weight:      SpO2: 100% 100% 100%   TempSrc:      BMI (Calculated):      >>ED evaluation thus far shows: BMP shows AKI with a creatinine of 1.6, EGFR 46, BUN of 27 and previous creatinine of 1.50.  LFTs added on and pending.  Magnesium  added on and pending. CBC shows white count of 18.0 hemoglobin of 7.9 platelets 474 neutrophils 92. CT of the abdomen today shows worsening of the fluid collection with hypoattenuating incomplete walls and can suspicious for developing abscess.  Please see complete report.  >>While in the ED patient received the following: Zofran  4 mg Morphine  4 mg Rocephin  2 g Blood culture, BMP, CBC.   Review of Systems  Gastrointestinal:  Positive for abdominal pain.  Genitourinary:  Positive for dysuria.  All other systems reviewed and are negative.  Past Medical History:  Diagnosis Date  Apical variant hypertrophic cardiomyopathy (HCC)    CAD (coronary artery disease)    a. LHC in 2011 with severe dz in D1/D2 and very distal LAD--> too small for PCI and managed medically.  b.  LHC in 10/2013 after an abnormal nuclear study w/ non-obst dz/c. 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   HOH (hard of hearing)    rgiht ear   Hyperlipidemia    Hypertension    Myocardial infarction [facility])    Peripheral vascular disease (HCC)    Stroke [facility])    Past Surgical History:  Procedure Laterality Date   CARDIAC CATHETERIZATION N/A 02/24/2016   Procedure: Left Heart Cath and Coronary Angiography;  Surgeon: Ozell Fell, MD;  Location: [facility] INVASIVE CV LAB;  Service: Cardiovascular;  Laterality: N/A;   CARDIAC CATHETERIZATION N/A 02/24/2016   Procedure: Coronary Stent Intervention;  Surgeon: Ozell Fell, MD;  Location: [facility] INVASIVE CV LAB;  Service: Cardiovascular;  Laterality: N/A;   CORONARY ANGIOPLASTY WITH STENT PLACEMENT  2017   CORONARY BALLOON ANGIOPLASTY N/A 04/30/2019   Procedure: CORONARY BALLOON ANGIOPLASTY;  Surgeon: Swaziland, Peter M, MD;  Location: [facility] INVASIVE CV LAB;  Service: Cardiovascular;  Laterality: N/A;   INGUINAL HERNIA REPAIR Left    LEFT HEART CATH AND CORONARY ANGIOGRAPHY N/A 04/30/2019   Procedure: LEFT HEART CATH AND CORONARY ANGIOGRAPHY;  Surgeon: Cherrie Toribio SAUNDERS, MD;  Location: [facility] INVASIVE CV LAB;  Service: Cardiovascular;  Laterality: N/A;   LEFT HEART CATH AND CORONARY ANGIOGRAPHY N/A 05/14/2019   Procedure: LEFT HEART CATH AND CORONARY ANGIOGRAPHY;  Surgeon: Swaziland, Peter M, MD;  Location[facility] INVASIVE CV LAB;  Service: Cardiovascular;  Laterality: N/A;   LEFT HEART CATHETERIZATION WITH CORONARY ANGIOGRAM N/A 11/22/2013   Procedure: LEFT HEART CATHETERIZATION WITH CORONARY ANGIOGRAM;  Surgeon: Victory LELON Claudene DOUGLAS, MD;  Location: [facility] CATH LAB;  Service: Cardiovascular;  Laterality: N/A;   ROBOT ASSISTED LAPAROSCOPIC RADICAL PROSTATECTOMY N/A 12/26/2023   Procedure: PROSTATECTOMY, RADICAL, ROBOT-ASSISTED, LAPAROSCOPIC;  Surgeon: Selma Donnice SAUNDERS, MD;  Location: [facility] ORS;  Service: Urology;  Laterality: N/A;   TEE WITHOUT CARDIOVERSION N/A 08/22/2015   Procedure: TRANSESOPHAGEAL ECHOCARDIOGRAM  (TEE);  Surgeon: Aleene JINNY Passe, MD;  Location: [facility] ENDOSCOPY;  Service: Cardiovascular;  Laterality: N/A;    reports that he has quit smoking. His smoking use included cigarettes. He has never used smokeless tobacco. He reports that he does not drink alcohol and does not use drugs. Allergies  Allergen Reactions   Lisinopril Swelling    lips swelling   Family History  Problem Relation Age of Onset   Hypertension Mother    Heart failure Mother    Hypertension Father    Colon cancer Father 74   Hypertension Brother    Heart disease Son    Heart disease Son    Colon cancer Paternal Uncle    Prior to Admission medications   Medication Sig Start Date End Date Taking? Authorizing Provider  diphenhydramine -acetaminophen  (TYLENOL  PM) 25-500 MG TABS tablet Take 1 tablet by mouth at bedtime as needed (for pain,sleep).   Yes [provider]  docusate sodium  (COLACE) 100 MG capsule Take 1 capsule (100 mg total) by mouth 2 (two) times daily. 12/26/23  Yes Dancy, Alan, PA-C  DULoxetine  (CYMBALTA ) 60 MG capsule Take 1 capsule (60 mg total) by mouth daily. For chronic pain 11/08/23  Yes Delbert Clam, MD  Evolocumab  (REPATHA  SURECLICK) 140 MG/ML SOAJ INJECT 140 MG INTO THE SKIN EVERY 14 (FOURTEEN) DAYS. 09/26/23  Yes Chandrasekhar, Mahesh A, MD  HYDROcodone -acetaminophen  (NORCO/VICODIN) 5-325 MG tablet Take 1-2 tablets by mouth every 6 (six) hours as needed for moderate pain (pain score 4-6) or severe pain (pain score 7-10). 12/26/23  Yes Dancy, Alan, PA-C  lactulose  (CHRONULAC ) 10 GM/15ML solution Take 15 mLs (10 g total) by mouth 3 (three) times daily. 12/29/23  Yes Newlin, Enobong, MD  losartan -hydrochlorothiazide  (HYZAAR ) 100-25 MG tablet Take 1 tablet by mouth daily. 11/08/23  Yes Newlin, Enobong, MD  ranolazine  (RANEXA ) 500 MG 12 hr tablet Take 1 tablet (500 mg total) by mouth 2 (two) times daily. 08/23/23  Yes Newlin, Enobong, MD  rosuvastatin  (CRESTOR ) 40 MG tablet Take 1 tablet (40 mg  total) by mouth daily. 08/23/23  Yes Newlin, Enobong, MD  amLODipine  (NORVASC ) 10 MG tablet Take 1 tablet (10 mg total) by mouth daily. 08/23/23   Newlin, Enobong, MD  nitroGLYCERIN  (NITROSTAT ) 0.4 MG SL tablet Place 1 tablet (0.4 mg total) under the tongue every 5 (five) minutes as needed for chest pain. 12/29/23   Delbert Clam, MD                                                                                 Vitals:   01/06/24 1500 01/06/24 1515 01/06/24 1530 01/06/24 1545  BP: 131/70 114/73 127/72 134/74  Pulse: 84 81 78   Resp: 20 20 18 20   Temp:      TempSrc:      SpO2: 100% 100% 100%   Weight:      Height:       Physical Exam Vitals reviewed.  Constitutional:      General: He is not in acute distress.    Appearance: He is not ill-appearing.  HENT:     Head: Normocephalic and atraumatic.  Eyes:     Extraocular Movements: Extraocular movements intact.  Cardiovascular:     Rate and Rhythm: Normal rate and regular rhythm.     Pulses: Normal pulses.     Heart sounds: Normal heart sounds.  Pulmonary:     Breath sounds: Normal breath sounds.  Abdominal:     General: There is distension.     Palpations: Abdomen is soft.     Tenderness: There is abdominal tenderness.  Musculoskeletal:     Right lower leg: No edema.     Left lower leg: No edema.  Neurological:     General: No focal deficit present.     Mental Status: He is alert and oriented to person, place, and time.     Labs on Admission: I have personally reviewed following labs and imaging studies CBC: Recent Labs  Lab 01/04/24 0952 01/04/24 0953 01/06/24 1215  WBC 7.3  --  18.0*  NEUTROABS 5.2  --  16.7*  HGB 9.3* 10.2*  7.9*  HCT 29.0* 30.0* 24.6*  MCV 95.7  --  93.9  PLT 472*  --  474*   Basic Metabolic Panel: Recent Labs  Lab 01/04/24 0952 01/04/24 0953 01/06/24 1215  NA 134* 136 137  K 3.4* 3.4* 3.7  CL 101 99 101  CO2 23  --  24  GLUCOSE 140* 137* 90  BUN 12 12 27*  CREATININE 1.54* 1.50*  1.67*  CALCIUM  8.5*  --  9.0   GFR: Estimated Creatinine Clearance: 44.4 mL/min (A) (by C-G formula based on SCr of 1.67 mg/dL (H)). Liver Function Tests: Recent Labs  Lab 01/04/24 0952  AST 15  ALT 11  ALKPHOS 48  BILITOT 0.9  PROT 6.4*  ALBUMIN  3.1*   Recent Labs  Lab 01/04/24 0953  LIPASE 28   No results for input(s): AMMONIA in the last 168 hours. Recent Labs    03/21/23 1232 08/23/23 1518 12/22/23 1129 12/27/23 0429 12/28/23 2344 12/29/23 0738 12/29/23 1245 01/04/24 0952 01/04/24 0953 01/06/24 1215  BUN 14 8 15 11  35* 30* 31* 12 12 27*  CREATININE 1.35* 1.31* 1.16 1.31* 5.25* 3.03* 2.30* 1.54* 1.50* 1.67*    Estimated Creatinine Clearance: 44.4 mL/min (A) (by C-G formula based on SCr of 1.67 mg/dL (H)).   Recent Labs    02/08/23 1514 03/21/23 1232 08/23/23 1518 12/22/23 1129 12/27/23 0429 12/28/23 2344 12/29/23 0738 12/29/23 1245 01/04/24 0952 01/04/24 0953 01/06/24 1215  BUN 17 14 8 15 11  35* 30* 31* 12 12 27*  CREATININE 1.38 1.35* 1.31* 1.16 1.31* 5.25* 3.03* 2.30* 1.54* 1.50* 1.67*  CO2 29 27 25 28 27 26 28 29 23   --  24   Cardiac Enzymes: No results for input(s): CKTOTAL, CKMB, CKMBINDEX, TROPONINI in the last 168 hours. BNP (last 3 results) No results for input(s): PROBNP in the last 8760 hours. HbA1C: No results for input(s): HGBA1C in the last 72 hours. CBG: Recent Labs  Lab 01/04/24 0948  GLUCAP 144*   Lipid Profile: No results for input(s): CHOL, HDL, LDLCALC, TRIG, CHOLHDL, LDLDIRECT in the last 72 hours. Thyroid  Function Tests: No results for input(s): TSH, T4TOTAL, FREET4, T3FREE, THYROIDAB in the last 72 hours. Anemia Panel: No results for input(s): VITAMINB12, FOLATE, FERRITIN, TIBC, IRON, RETICCTPCT in the last 72 hours. Urine analysis:    Component Value Date/Time   COLORURINE YELLOW 10/18/2018 1131   APPEARANCEUR CLEAR 10/18/2018 1131   LABSPEC 1.020 10/18/2018 1131    PHURINE 7.0 10/18/2018 1131   GLUCOSEU NEGATIVE 10/18/2018 1131   HGBUR NEGATIVE 10/18/2018 1131   BILIRUBINUR NEGATIVE 10/18/2018 1131   KETONESUR NEGATIVE 10/18/2018 1131   PROTEINUR NEGATIVE 10/05/2014 2315   UROBILINOGEN 0.2 10/18/2018 1131   NITRITE NEGATIVE 10/18/2018 1131   LEUKOCYTESUR NEGATIVE 10/18/2018 1131   Radiological Exams on Admission: CT ABDOMEN PELVIS W CONTRAST Result Date: 01/06/2024 CLINICAL DATA:  Abdominal pain, post-op. * Tracking Code: BO * EXAM: CT ABDOMEN AND PELVIS WITH CONTRAST TECHNIQUE: Multidetector CT imaging of the abdomen and pelvis was performed using the standard protocol following bolus administration of intravenous contrast. RADIATION DOSE REDUCTION: This exam was performed according to the departmental dose-optimization program which includes automated exposure control, adjustment of the mA and/or kV according to patient size and/or use of iterative reconstruction technique. CONTRAST:  75mL OMNIPAQUE  IOHEXOL  350 MG/ML SOLN COMPARISON:  CT scan abdomen and pelvis from 01/04/2024. FINDINGS: Lower chest: There are subpleural atelectatic changes in the visualized lung bases. No overt consolidation. No pleural effusion. The heart is normal  in size. No pericardial effusion. Hepatobiliary: The liver is normal in size. Non-cirrhotic configuration. No suspicious mass. No intrahepatic or extrahepatic bile duct dilation. There are new hyperattenuating areas in the dependent portion of the gallbladder, which may represent vicarious excretion of previously administered intravenous contrast. Normal gallbladder wall thickness. No pericholecystic inflammatory changes. Pancreas: Unremarkable. No pancreatic ductal dilatation or surrounding inflammatory changes. Spleen: Within normal limits. No focal lesion. Adrenals/Urinary Tract: Adrenal glands are unremarkable. No suspicious renal mass. No hydronephrosis. No renal or ureteric calculi. Urinary bladder is decompressed secondary  to Foley catheter, which appears in satisfactory position Stomach/Bowel: No disproportionate dilation of the small or large bowel loops. Appendix is not distinctly visualized. Vascular/Lymphatic: Redemonstration of fluid collection in the dependent pelvis currently measuring up to 7.3 x 9.6 cm, increased since the prior study. The collection exhibits thin hyperattenuating incomplete walls. Findings again may represent postoperative seroma versus developing abscess. There is also redemonstration of fluid and small amount of nondependent air in the space of Retzius, surrounding the urinary bladder as well as along the bilateral pelvic sidewalls, which is likely secondary to false passage/malposition of the Foley bulb seen on the prior exam. No abdominal or pelvic lymphadenopathy, by size criteria. No aneurysmal dilation of the major abdominal arteries. There are moderate peripheral atherosclerotic vascular calcifications of the aorta and its major branches. Reproductive: There is history of prostate resection. Other: Supraumbilical midline surgical scar noted. Musculoskeletal: No suspicious osseous lesions. There are mild multilevel degenerative changes in the visualized spine. IMPRESSION: 1. Redemonstration of fluid collection in the dependent pelvis, which has increased since the prior study. The collection exhibits thin hyperattenuating incomplete walls. Findings again may represent postoperative seroma versus developing abscess. 2. There is also redemonstration of fluid and small amount of nondependent air in the Space of Retzius, surrounding the urinary bladder as well as along the bilateral pelvic sidewalls, which is likely secondary to false passage/malposition of the Foley bulb seen on the prior exam. 3. Multiple other nonacute observations, as described above. Aortic Atherosclerosis (ICD10-I70.0). Electronically Signed   By: Ree Molt M.D.   On: 01/06/2024 15:03   Data Reviewed: Relevant notes from  primary care and specialist visits, past discharge summaries as available in EHR, including Care Everywhere . Prior diagnostic testing as pertinent to current admission diagnoses, Updated medications and problem lists for reconciliation .ED course, including vitals, labs, imaging, treatment and response to treatment,Triage notes, nursing and pharmacy notes and ED provider's notes.Notable results as noted in HPI.Discussed case with EDMD/ ED APP/ or Specialty MD on call and as needed.  Assessment & Plan  >> Sepsis 2/2 UTI / intra-abdominal abscess: Admit to progressive unit at [facility] with continuous cardiac monitoring pulse oximetry with vitals. Patient given 1 L LR bolus, maintenance fluid. Lactic acid pending. Follow sensitivities. Broad-spectrum IV antibiotic coverage due to recent surgery instrumentation and now sepsis with possible intra-abdominal abscess. Urology consult per receiving team.  >> Essential hypertension: Vitals:   01/06/24 1245 01/06/24 1300 01/06/24 1315 01/06/24 1330  BP: 111/70 120/70 118/65 118/66   01/06/24 1345 01/06/24 1400 01/06/24 1430 01/06/24 1445  BP: 128/75 119/71 115/69 123/73   01/06/24 1500 01/06/24 1515 01/06/24 1530 01/06/24 1545  BP: 131/70 114/73 127/72 134/74  Blood pressures have been soft we will therefore hold patient's HCTZ ACE inhibitor. Continue with maintenance IV fluids and follow.   >> History of CAD: Will continue patient on rosuvastatin .   >> AKI: Lab Results  Component Value Date   CREATININE  1.67 (H) 01/06/2024   CREATININE 1.50 (H) 01/04/2024   CREATININE 1.54 (H) 01/04/2024  Will currently hold patient's losartan  and HCTZ.   DVT prophylaxis:  SCDs Consults:  Urology.   Advance Care Planning:    Code Status: Full Code   Family Communication:  Wife at bedside Disposition Plan:  Darryle Long Severity of Illness: The appropriate patient status for this patient is INPATIENT. Inpatient status is judged to be  reasonable and necessary in order to provide the required intensity of service to ensure the patient's safety. The patient's presenting symptoms, physical exam findings, and initial radiographic and laboratory data in the context of their chronic comorbidities is felt to place them at high risk for further clinical deterioration. Furthermore, it is not anticipated that the patient will be medically stable for discharge from the hospital within 2 midnights of admission.   * I certify that at the point of admission it is my clinical judgment that the patient will require inpatient hospital care spanning beyond 2 midnights from the point of admission due to high intensity of service, high risk for further deterioration and high frequency of surveillance required.*  Unresulted Labs (From admission, onward)     Start     Ordered   01/07/24 0500  Protime-INR  Tomorrow morning,   R        01/06/24 1549   01/07/24 0500  Cortisol-am, blood  Tomorrow morning,   R        01/06/24 1549   01/07/24 0500  CBC  Tomorrow morning,   R        01/06/24 1550   01/07/24 0500  Comprehensive metabolic panel  Tomorrow morning,   R        01/06/24 1550   01/06/24 1549  Culture, Urine (Do not remove urinary catheter, catheter placed by urology or difficult to place)  (Urine Culture)  Once,   R       Question:  Indication  Answer:  Sepsis   01/06/24 1550   01/06/24 1546  HIV Antibody (routine testing w rflx)  (HIV Antibody (Routine testing w reflex) panel)  Once,   R        01/06/24 1549   01/06/24 1544  Hepatic function panel  Once,   URGENT        01/06/24 1544   01/06/24 1544  Lactic acid, plasma  (Lactic Acid)  STAT Now then every 3 hours,   R (with STAT occurrences)      01/06/24 1544   01/06/24 1119  Blood culture (routine x 2)  BLOOD CULTURE X 2,   R      01/06/24 1118            Meds ordered this encounter  Medications   cefTRIAXone  (ROCEPHIN ) 2 g in sodium chloride  0.9 % 100 mL IVPB    Antibiotic  Indication::   Bacteremia   morphine  (PF) 4 MG/ML injection 4 mg   ondansetron  (ZOFRAN ) injection 4 mg   iohexol  (OMNIPAQUE ) 350 MG/ML injection 75 mL   lactated ringers  bolus 1,000 mL    Reason 30 mL/kg dose is not being ordered:   Other (see comment below)    Comment:   Nonseptic shock clinical presentation, first lactic acid pending.   lactated ringers  bolus 1,000 mL   pantoprazole  (PROTONIX ) injection 40 mg   DULoxetine  (CYMBALTA ) DR capsule 60 mg    For chronic pain     rosuvastatin  (CRESTOR ) tablet 40 mg   cefTRIAXone  (ROCEPHIN )  2 g in sodium chloride  0.9 % 100 mL IVPB    Antibiotic Indication::   UTI   vancomycin  variable dose per unstable renal function (pharmacist dosing)   vancomycin  (VANCOREADY) IVPB 1500 mg/300 mL    Indication::   Other Indication (list below)     Orders Placed This Encounter  Procedures   Blood culture (routine x 2)   Culture, Urine (Do not remove urinary catheter, catheter placed by urology or difficult to place)   CT ABDOMEN PELVIS W CONTRAST   CBC with Differential   Basic metabolic panel   Hepatic function panel   Lactic acid, plasma   HIV Antibody (routine testing w rflx)   Protime-INR   Cortisol-am, blood   CBC   Comprehensive metabolic panel   Diet Heart Room service appropriate? Yes; Fluid consistency: Thin   Vital signs   Notify physician (specify)   Mobility Protocol: No Restrictions   Refer to Sidebar Report Mobility Protocol for Adult Inpatient   If lactate (lactic acid) >2, verify repeat lactic acid order has been placed to be drawn   Document vital signs within 1-hour of fluid bolus completion and notify provider of bolus completion   Vital signs   Vital signs   RN to call RRT (rapid response team)   Initiate Adult Central Line Maintenance and Catheter Protocol for patients with central line (CVC, PICC, Port, Hemodialysis, Trialysis)   Apply Sepsis Care Plan   Refer to Sidebar Report: Sepsis Bundle ED/IP   Assess and Document  Glasgow Coma Scale   Initiate Oral Care Protocol   Initiate Carrier Fluid Protocol   RN may order General Admission PRN Orders utilizing General Admission PRN medications (through manage orders) for the following patient needs: allergy symptoms (Claritin), cold sores (Carmex), cough (Robitussin DM), eye irritation (Liquifilm Tears), hemorrhoids (Tucks), indigestion (Maalox), minor skin irritation (Hydrocortisone Cream), muscle pain Lucienne Gay), nose irritation (saline nasal spray) and sore throat (Chloraseptic spray).   SCDs   Cardiac Monitoring - Continuous Indefinite   Full code   Consult to urology   Consult to hospitalist   vancomycin  per pharmacy consult   Pulse oximetry (single)   Insert peripheral IV   Admit to Inpatient (patient's expected length of stay will be greater than 2 midnights or inpatient only procedure)   Admit to Inpatient (patient's expected length of stay will be greater than 2 midnights or inpatient only procedure)   Aspiration precautions   Fall precautions    Author: Mario LULLA Blanch, MD 12 pm- 8 pm. [facility]. 01/06/2024 4:40 PM Please note for any communication after hours contact [facility] Assigned provider on call on Amion.

## 2024-01-06 NOTE — ED Triage Notes (Signed)
 Pt bib POV c.o abnormal labs. Pt received a phone call from [facility] with confirmed infection in blood. Pt was advise to return to hospital.

## 2024-01-06 NOTE — ED Notes (Signed)
 Call for pt pick to [name],pt is going to room 1231 [place] is the accepting dr.

## 2024-01-06 NOTE — ED Notes (Signed)
 Dr ROSEY Blanch will be the admitter

## 2024-01-06 NOTE — ED Notes (Signed)
 CARELINK on unit to transport pt to [facility] .

## 2024-01-06 NOTE — Progress Notes (Signed)
 Pharmacy Antibiotic Note  [name] is a 63 y.o. male for which pharmacy has been consulted for vancomycin  dosing for bacteremia and concern for abscess formation on CT imaging s/p a robotic prostatectomy on 12/25/23.  Patient with positive blood cultures w/ E coli (no resistance detected on BCID) and sensitivities are pending.  SCr 1.67 - 5.25 on 7/30 WBC 18; T 97.7; HR 78; RR 20  Plan: Ceftriaxone  per MD Vancomycin  1500 mg once, subsequent dosing as indicated per random vancomycin  level until renal function stable and/or improved, at which time scheduled dosing can be considered Monitor WBC, fever, renal function, cultures De-escalate when able F/u ID recommendations  Height: 5' 8 (172.7 cm) Weight: 69.2 kg (152 lb 8.9 oz) IBW/kg (Calculated) : 68.4  Temp (24hrs), Avg:98.5 F (36.9 C), Min:97.7 F (36.5 C), Max:99.2 F (37.3 C)  Recent Labs  Lab 01/04/24 0952 01/04/24 0953 01/04/24 1034 01/04/24 1309 01/06/24 1215  WBC 7.3  --   --   --  18.0*  CREATININE 1.54* 1.50*  --   --  1.67*  LATICACIDVEN  --   --  1.8 1.6  --     Estimated Creatinine Clearance: 44.4 mL/min (A) (by C-G formula based on SCr of 1.67 mg/dL (H)).    Allergies  Allergen Reactions   Lisinopril Swelling    lips swelling   Microbiology results: Pending  Thank you for allowing pharmacy to be a part of this patient's care.  Dorn Buttner, PharmD, BCPS 01/06/2024 4:00 PM ED Clinical Pharmacist -  [phone]

## 2024-01-06 NOTE — ED Provider Notes (Signed)
 [place] EMERGENCY DEPARTMENT AT [facility] Provider Note   CSN: [id number] Arrival date & time: 01/06/24  1039     Patient presents with: Blood Infection   [name] is a 63 y.o. male.   63 year old male presenting to the emergency department today with concern for bacteremia.  The patient was seen 2 days ago and was found to have a misplaced urinary catheter after prostatectomy.  He was feeling better and was eventually sent home 2 days ago.  His blood cultures did grow E. coli.  He states that his pain in his abdomen is better but he is still having abdominal pain.  States he has not really been able to eat much due to this.  He has not had any fevers but has had some occasional chills.  He came back today for further evaluation.        Prior to Admission medications   Medication Sig Start Date End Date Taking? Authorizing Provider  diphenhydramine -acetaminophen  (TYLENOL  PM) 25-500 MG TABS tablet Take 1 tablet by mouth at bedtime as needed (for pain,sleep).   Yes [provider]  docusate sodium  (COLACE) 100 MG capsule Take 1 capsule (100 mg total) by mouth 2 (two) times daily. 12/26/23  Yes Dancy, Alan, PA-C  DULoxetine  (CYMBALTA ) 60 MG capsule Take 1 capsule (60 mg total) by mouth daily. For chronic pain 11/08/23  Yes Newlin, Corrina, MD  Evolocumab  (REPATHA  SURECLICK) 140 MG/ML SOAJ INJECT 140 MG INTO THE SKIN EVERY 14 (FOURTEEN) DAYS. 09/26/23  Yes Chandrasekhar, Mahesh A, MD  HYDROcodone -acetaminophen  (NORCO/VICODIN) 5-325 MG tablet Take 1-2 tablets by mouth every 6 (six) hours as needed for moderate pain (pain score 4-6) or severe pain (pain score 7-10). 12/26/23  Yes Dancy, Amanda, PA-C  lactulose  (CHRONULAC ) 10 GM/15ML solution Take 15 mLs (10 g total) by mouth 3 (three) times daily. 12/29/23  Yes Newlin, Enobong, MD  losartan -hydrochlorothiazide  (HYZAAR ) 100-25 MG tablet Take 1 tablet by mouth daily. 11/08/23  Yes Newlin, Enobong, MD  ranolazine   (RANEXA ) 500 MG 12 hr tablet Take 1 tablet (500 mg total) by mouth 2 (two) times daily. 08/23/23  Yes Newlin, Enobong, MD  rosuvastatin  (CRESTOR ) 40 MG tablet Take 1 tablet (40 mg total) by mouth daily. 08/23/23  Yes Newlin, Enobong, MD  amLODipine  (NORVASC ) 10 MG tablet Take 1 tablet (10 mg total) by mouth daily. 08/23/23   Newlin, Enobong, MD  nitroGLYCERIN  (NITROSTAT ) 0.4 MG SL tablet Place 1 tablet (0.4 mg total) under the tongue every 5 (five) minutes as needed for chest pain. 12/29/23   Newlin, Enobong, MD    Allergies: Lisinopril    Review of Systems  Gastrointestinal:  Positive for abdominal pain.  All other systems reviewed and are negative.   Updated Vital Signs BP 128/75   Pulse 97   Temp 97.7 F (36.5 C)   Resp 18   Ht 5' 8 (1.727 m)   Wt 69.2 kg   SpO2 94%   BMI 23.20 kg/m   Physical Exam Vitals and nursing note reviewed.   Gen: NAD Eyes: PERRL, EOMI HEENT: no oropharyngeal swelling Neck: trachea midline Resp: clear to auscultation bilaterally Card: RRR, no murmurs, rubs, or gallops Abd: Periumbilical tenderness noted without guarding or rebound, urinary catheter in place draining pink urine Extremities: no calf tenderness, no edema Vascular: 2+ radial pulses bilaterally, 2+ DP pulses bilaterally Skin: no rashes Psyc: acting appropriately   (all labs ordered are listed, but only abnormal results are displayed) Labs Reviewed  CBC  WITH DIFFERENTIAL/PLATELET - Abnormal; Notable for the following components:      Result Value   WBC 18.0 (*)    RBC 2.62 (*)    Hemoglobin 7.9 (*)    HCT 24.6 (*)    Platelets 474 (*)    Neutro Abs 16.7 (*)    Abs Immature Granulocytes 0.10 (*)    All other components within normal limits  BASIC METABOLIC PANEL WITH GFR - Abnormal; Notable for the following components:   BUN 27 (*)    Creatinine, Ser 1.67 (*)    GFR, Estimated 46 (*)    All other components within normal limits  CULTURE, BLOOD (ROUTINE X 2)  CULTURE, BLOOD  (ROUTINE X 2)    EKG: None  Radiology: CT ABDOMEN PELVIS W CONTRAST Result Date: 01/06/2024 CLINICAL DATA:  Abdominal pain, post-op. * Tracking Code: BO * EXAM: CT ABDOMEN AND PELVIS WITH CONTRAST TECHNIQUE: Multidetector CT imaging of the abdomen and pelvis was performed using the standard protocol following bolus administration of intravenous contrast. RADIATION DOSE REDUCTION: This exam was performed according to the departmental dose-optimization program which includes automated exposure control, adjustment of the mA and/or kV according to patient size and/or use of iterative reconstruction technique. CONTRAST:  75mL OMNIPAQUE  IOHEXOL  350 MG/ML SOLN COMPARISON:  CT scan abdomen and pelvis from 01/04/2024. FINDINGS: Lower chest: There are subpleural atelectatic changes in the visualized lung bases. No overt consolidation. No pleural effusion. The heart is normal in size. No pericardial effusion. Hepatobiliary: The liver is normal in size. Non-cirrhotic configuration. No suspicious mass. No intrahepatic or extrahepatic bile duct dilation. There are new hyperattenuating areas in the dependent portion of the gallbladder, which may represent vicarious excretion of previously administered intravenous contrast. Normal gallbladder wall thickness. No pericholecystic inflammatory changes. Pancreas: Unremarkable. No pancreatic ductal dilatation or surrounding inflammatory changes. Spleen: Within normal limits. No focal lesion. Adrenals/Urinary Tract: Adrenal glands are unremarkable. No suspicious renal mass. No hydronephrosis. No renal or ureteric calculi. Urinary bladder is decompressed secondary to Foley catheter, which appears in satisfactory position Stomach/Bowel: No disproportionate dilation of the small or large bowel loops. Appendix is not distinctly visualized. Vascular/Lymphatic: Redemonstration of fluid collection in the dependent pelvis currently measuring up to 7.3 x 9.6 cm, increased since the prior  study. The collection exhibits thin hyperattenuating incomplete walls. Findings again may represent postoperative seroma versus developing abscess. There is also redemonstration of fluid and small amount of nondependent air in the space of Retzius, surrounding the urinary bladder as well as along the bilateral pelvic sidewalls, which is likely secondary to false passage/malposition of the Foley bulb seen on the prior exam. No abdominal or pelvic lymphadenopathy, by size criteria. No aneurysmal dilation of the major abdominal arteries. There are moderate peripheral atherosclerotic vascular calcifications of the aorta and its major branches. Reproductive: There is history of prostate resection. Other: Supraumbilical midline surgical scar noted. Musculoskeletal: No suspicious osseous lesions. There are mild multilevel degenerative changes in the visualized spine. IMPRESSION: 1. Redemonstration of fluid collection in the dependent pelvis, which has increased since the prior study. The collection exhibits thin hyperattenuating incomplete walls. Findings again may represent postoperative seroma versus developing abscess. 2. There is also redemonstration of fluid and small amount of nondependent air in the Space of Retzius, surrounding the urinary bladder as well as along the bilateral pelvic sidewalls, which is likely secondary to false passage/malposition of the Foley bulb seen on the prior exam. 3. Multiple other nonacute observations, as described above. Aortic Atherosclerosis (ICD10-I70.0).  Electronically Signed   By: Ree Molt M.D.   On: 01/06/2024 15:03   MR Cervical Spine Wo Contrast Result Date: 01/04/2024 EXAM: MRI CERVICAL SPINE WITHOUT CONTRAST 01/04/2024 03:46:13 PM TECHNIQUE: Multiplanar multisequence MRI of the cervical spine was performed without the administration of intravenous contrast. COMPARISON: CT cervical spine dated 07/2021/24. CLINICAL HISTORY: Ataxia, nontraumatic, cervical pathology  suspected. FINDINGS: BONES AND ALIGNMENT: Straightening of the normal cervical lordosis. Similar trace anterolisthesis of C7 on T1, likely related to facet arthrosis. No bone marrow edema or evidence of fracture. Vertebral body heights are maintained. SPINAL CORD: No cervical spinal cord signal abnormality. SOFT TISSUES: Paraspinal soft tissues are unremarkable. C2-C3: There is a disc osteophyte complex slightly eccentric to the left which indents the ventral thecal sac with mild flattening of the ventral cervical cord. Bilateral facet arthrosis, more pronounced on the left. There is severe foraminal stenosis on the left. C3-C4: There is a disc osteophyte complex and left paracentral disc protrusion which contacts and flattens the ventral cervical cord. Mild thickening of the ligamentum flavum. Mild-to-moderate spinal canal stenosis. Bilateral facet arthrosis resulting in mild bilateral foraminal stenosis. C4-C5: There is moderate disc desiccation and disc osteophyte complex at C4-C5, eccentric to the left which indents the ventral thecal sac with mild flattening of the ventral cervical cord. Mild-to-moderate spinal canal stenosis. Bilateral facet arthrosis. There is moderate-to-severe right and mild left foraminal stenosis. C5-C6: There is a disc osteophyte complex and right central disc protrusion which indents the ventral thecal sac which also contacts the ventral cervical cord. Thickening of the ligaments. Moderate spinal canal stenosis. Facet arthrosis and uncovertebral hypertrophy with moderate right and mild left foraminal stenosis. C6-C7: There is a disc osteophyte complex and right paracentral disc protrusion which indents the thecal sac without contacting the spinal cord. Bilateral facet arthrosis and a preligamentous osteophyte complex, more pronounced on the right. There is moderate right and mild left foraminal stenosis. C7-T1: There is a diffuse disc bulge which indents the thecal sac without contact  with the spinal cord. Bilateral severe facet arthrosis. There is moderate bilateral foraminal stenosis. IMPRESSION: 1. Degenerative changes in the cervical spine as detailed above. 2. Multilevel foraminal stenosis, greatest and severe on the left at C2-3, similar to prior. 3. Additional moderate-to-severe foraminal stenosis on the right at C4-5 and moderate foraminal stenosis on the right at C5-6 and C6-7 and bilaterally at C7-T1. 4. Similar moderate spinal canal stenosis at C5-6 and C7-T1. Electronically signed by: Donnice Mania MD 01/04/2024 04:54 PM EDT RP Workstation: [id number]   MR Brain W and Wo Contrast Result Date: 01/04/2024 EXAM: MRI BRAIN WITH AND WITHOUT CONTRAST 01/04/2024 03:45:23 PM TECHNIQUE: Multiplanar multisequence MRI of the head/brain was performed with and without the administration of intravenous contrast. COMPARISON: CT head and CTA head and neck earlier the same day. MRI head dated 11/26/2015. CLINICAL HISTORY: Metastatic disease evaluation. FINDINGS: BRAIN AND VENTRICLES: Scattered areas of T2/FLAIR hyperintensity in the supratentorial white matter, predominantly within the subcortical white matter, which is mildly increased compared to the MRI from 2017. No abnormal enhancement of the brain parenchyma. No acute infarct. No acute intracranial hemorrhage. No mass effect or midline shift. No hydrocephalus. The sella is unremarkable. Normal flow voids. No mass. ORBITS: No acute abnormality. SINUSES: No acute abnormality. BONES AND SOFT TISSUES: Normal bone marrow signal and enhancement. No acute soft tissue abnormality. IMPRESSION: 1. No acute intracranial abnormality. 2. Scattered areas of signal abnormality primarily in the subcortical white matter, mildly increased compared to the MRI  from 2017. Findings likely reflect chronic microvascular ischemic changes versus other vasculopathies. 3. No abnormal enhancement. Electronically signed by: Donnice Mania MD 01/04/2024 04:04 PM EDT RP  Workstation: [id number]     Procedures   Medications Ordered in the ED  cefTRIAXone  (ROCEPHIN ) 2 g in sodium chloride  0.9 % 100 mL IVPB (0 g Intravenous Stopped 01/06/24 1343)  morphine  (PF) 4 MG/ML injection 4 mg (4 mg Intravenous Given 01/06/24 1342)  ondansetron  (ZOFRAN ) injection 4 mg (4 mg Intravenous Given 01/06/24 1342)  iohexol  (OMNIPAQUE ) 350 MG/ML injection 75 mL (75 mLs Intravenous Contrast Given 01/06/24 1424)                                    Medical Decision Making 63 year old male with prostatectomy last month presenting to the emergency department today with abdominal pain and positive blood cultures.  Will repeat his CT scan to evaluate the fluid collection that was seen a few days ago.  I will start the patient on Rocephin  and repeat blood cultures here.  He will require admission.  Vital signs are stable and he does not appear to be septic based on initial vital signs.  The patient does have a leukocytosis.  CT scan does show some mild enlargement of the urine collection.  I called discussed this with Dr. Selma.  Recommends admission to medicine at [facility].  Calls placed to hospital service for admission.  Amount and/or Complexity of Data Reviewed Labs: ordered. Radiology: ordered.  Risk Prescription drug management.        Final diagnoses:  Bacteremia    ED Discharge Orders     None          Ula Prentice SAUNDERS, MD 01/06/24 1531

## 2024-01-06 NOTE — Hospital Course (Addendum)
 [name] is a 63 y.o. year old patient with prostate cancer who underwent RALP with b/l PLND on 12/26/2023 without complication and discharged on 12/27/2023.  Patient presented to our facility 01/06/2024 after previous blood cultures reported positive for E. coli.   Prior hospital course: after previous procedure patient had notable pain at home with worsening Foley drainage, nausea, vomiting. He was evaluated 7/30 by urology who replaced Foley which appears to have been poorly positioned after discharge.  1 week later on 8/6 patient had syncopal event at home with abdominal pain found to be hypotensive/hypovolemic in the ED but improved with bolus.  CT during this evaluation noted fluid collection in the pelvis which appears to have increased in size since prior hospitalization.   Assessment & Plan:   E. coli bacteremia Sepsis, POA - Source likely urogenital given recent surgery evaluation and Foley being previously dislodged - Robotic-assisted laparoscopic vesicourethral anastomotic repair 8/10 tolerated well - has been on rocephin ; sensitivites reviewed, will de-escalate to augmentin  (to still cover for anaerobes while awaiting pelvic culture further growth); continued on total of 7-day course postop  Pelvic fluid collection -Unspecified, concern for possible urine leak, ascites versus underlying infection - Fluid drainage/tube placement 8/9 per radiology -culture with rare GNR so far - s/p repair of vesicular urethral anastomotic leak 01/08/2024 per urology, bilateral ureteral stent placement  Prostate cancer status post robotic assisted laparoscopic radical prostatectomy with bilateral lymph node dissection 12/06/2023 -Urology following, noted [id number] disease   AKI -s/p IVF; tolerated regular diet prior to discharge HTN - rebounding; resume home regimen CAD - resume ARB; continue statin HLD - hold Evolucomab while in hospital, continue statin

## 2024-01-06 NOTE — Consult Note (Signed)
 Urology Consult   Physician requesting consult: Prentice Medicus, MD   Reason for consult: Bacteremia, hx of prostate cancer s/p prostatectomy  History of Present Illness:  [name] is a 63 y.o. 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   Of note, intraoperatively, he had a negative urine leak test.  JP drain was removed POD1 prior to discharge with seroequivalent creatinine without evidence of urine leak.  He was discharged home on postoperative day 1 and presented on postoperative day 2 in the evening with complaints of poor catheter drainage, abdominal pain, nausea and emesis.  Attempts to irrigate the catheter were unsuccessful. At the time, it was apparent that the catheter had been dislodged distally. Catheter was deflated and advanced back into the bladder with immediate return of 1 L clear yellow urine.  Balloon was reinflated with 15 cc sterile water.  He was also found to have AKI with creatinine 5.  CT A/P 12/29/2023 revealed free fluid and air related to recent surgery however expected postoperative surgical changes and bladder decompressed with Foley catheter.  His creatinine down trended that day and he had excellent urine output.   Over the next week, he reported good urine output and this has been recorded well by his wife. He noted decreased urine output on 8/6 as well as lower abdominal pain.  He also had a syncopal episode at that time and presented to the ED.  CT head and MRI brain were obtained and were unremarkable. Of note, CT abdomen pelvis 01/04/2024 revealed fluid collection in the dependent pelvis measuring 4 x 7 cm as well as Foley catheter in place however balloon appeared to be inferior to the bladder on the left paramedian side.  The bladder was  mildly distended.  I removed Foley catheter on 8/6 and performed bedside cystoscopy and was able to navigate into the bladder where I passed a wire and then a council catheter over the wire.  Visualization was difficult and there is some suspicion for compromised vesicourethral anastomosis.  Foley catheter drained well and he was discharged home.  Blood cultures obtained on 8/6 resulted positive for E. coli and thus patient was called to return to the ED for IV antibiotics.  Patient reports he has been doing well over the past 48 hours.  He has had decreased abdominal pain.  He denies fevers.  He has been tolerating some diet.  His Foley catheter has been draining yellow urine.  Repeat CT A/P 01/06/2024 reveals redemonstration of pelvic fluid collection has increased in size now measuring 7 x 9 cm.  There is also some fluid around the space of Retzius.  Foley catheter is seen within the bladder and the bladder is decompressed.   Past Medical History:  Diagnosis Date   Apical variant hypertrophic cardiomyopathy (HCC)    CAD (coronary artery disease)    a. LHC in 2011 with severe dz in D1/D2 and very distal LAD--> too small for PCI and managed medically.  b.  LHC in 10/2013 after an abnormal nuclear study w/ non-obst dz/c. 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   HOH (hard of hearing)    rgiht ear   Hyperlipidemia    Hypertension    Myocardial infarction [facility])    Peripheral vascular disease (HCC)    Stroke [facility])     Past Surgical History:  Procedure Laterality Date   CARDIAC CATHETERIZATION N/A 02/24/2016   Procedure: Left Heart Cath  and Coronary Angiography;  Surgeon: Ozell Fell, MD;  Location: [facility] INVASIVE CV LAB;  Service: Cardiovascular;  Laterality: N/A;   CARDIAC CATHETERIZATION N/A 02/24/2016   Procedure: Coronary Stent Intervention;  Surgeon: Ozell Fell, MD;  Location: [facility] INVASIVE CV LAB;  Service: Cardiovascular;  Laterality: N/A;   CORONARY ANGIOPLASTY WITH STENT PLACEMENT  2017   CORONARY BALLOON ANGIOPLASTY N/A 04/30/2019   Procedure: CORONARY BALLOON ANGIOPLASTY;  Surgeon: Swaziland, Peter M, MD;  Location: [facility] INVASIVE CV LAB;  Service: Cardiovascular;  Laterality: N/A;   INGUINAL HERNIA REPAIR Left    LEFT HEART CATH AND CORONARY ANGIOGRAPHY N/A 04/30/2019   Procedure: LEFT HEART CATH AND CORONARY ANGIOGRAPHY;  Surgeon: Cherrie Toribio SAUNDERS, MD;  Location: [facility] INVASIVE CV LAB;  Service: Cardiovascular;  Laterality: N/A;   LEFT HEART CATH AND CORONARY ANGIOGRAPHY N/A 05/14/2019   Procedure: LEFT HEART CATH AND CORONARY ANGIOGRAPHY;  Surgeon: Swaziland, Peter M, MD;  Location: [facility] INVASIVE CV LAB;  Service: Cardiovascular;  Laterality: N/A;   LEFT HEART CATHETERIZATION WITH CORONARY ANGIOGRAM N/A 11/22/2013   Procedure: LEFT HEART CATHETERIZATION WITH CORONARY ANGIOGRAM;  Surgeon: Victory LELON Claudene DOUGLAS, MD;  Location: [facility] CATH LAB;  Service: Cardiovascular;  Laterality: N/A;   ROBOT ASSISTED LAPAROSCOPIC RADICAL PROSTATECTOMY N/A 12/26/2023   Procedure: PROSTATECTOMY, RADICAL, ROBOT-ASSISTED, LAPAROSCOPIC;  Surgeon: Selma Donnice SAUNDERS, MD;  Location: [facility] ORS;  Service: Urology;  Laterality: N/A;   TEE WITHOUT CARDIOVERSION N/A 08/22/2015   Procedure: TRANSESOPHAGEAL ECHOCARDIOGRAM (TEE);  Surgeon: Aleene JINNY Passe, MD;  Location: [facility] ENDOSCOPY;  Service: Cardiovascular;  Laterality: N/A;    Current Hospital Medications:  Home Meds:  No current facility-administered medications on file prior to encounter.   Current Outpatient Medications on File Prior to Encounter  Medication Sig Dispense Refill    diphenhydramine -acetaminophen  (TYLENOL  PM) 25-500 MG TABS tablet Take 1 tablet by mouth at bedtime as needed (for pain,sleep).     docusate sodium  (COLACE) 100 MG capsule Take 1 capsule (100 mg total) by mouth 2 (two) times daily.     DULoxetine  (CYMBALTA ) 60 MG capsule Take 1 capsule (60 mg total) by mouth daily. For chronic pain 90 capsule 1   Evolocumab  (REPATHA  SURECLICK) 140 MG/ML SOAJ INJECT 140 MG INTO THE SKIN EVERY 14 (FOURTEEN) DAYS. 6 mL 3   HYDROcodone -acetaminophen  (NORCO/VICODIN) 5-325 MG tablet Take 1-2 tablets by mouth every 6 (six) hours as needed for moderate pain (pain score 4-6) or severe pain (pain score 7-10). 20 tablet 0   lactulose  (CHRONULAC ) 10 GM/15ML solution Take 15 mLs (10 g total) by mouth 3 (three) times daily. 946 mL 0   losartan -hydrochlorothiazide  (HYZAAR ) 100-25 MG tablet Take 1 tablet by mouth daily. 90 tablet 1   ranolazine  (RANEXA ) 500 MG 12 hr tablet Take 1 tablet (500 mg total) by mouth 2 (two) times daily. 180 tablet 1   rosuvastatin  (CRESTOR ) 40 MG tablet Take 1 tablet (40 mg total) by mouth daily. 90 tablet 1  amLODipine  (NORVASC ) 10 MG tablet Take 1 tablet (10 mg total) by mouth daily. 90 tablet 1   nitroGLYCERIN  (NITROSTAT ) 0.4 MG SL tablet Place 1 tablet (0.4 mg total) under the tongue every 5 (five) minutes as needed for chest pain. 25 tablet 3     Scheduled Meds:  DULoxetine   60 mg Oral Daily   pantoprazole  (PROTONIX ) IV  40 mg Intravenous Q12H   rosuvastatin   40 mg Oral Daily   vancomycin  variable dose per unstable renal function (pharmacist dosing)   Does not apply See admin instructions   Continuous Infusions:  [START ON 01/07/2024] cefTRIAXone  (ROCEPHIN )  IV     lactated ringers      vancomycin      PRN Meds:.  Allergies:  Allergies  Allergen Reactions   Lisinopril Swelling    lips swelling    Family History  Problem Relation Age of Onset   Hypertension Mother    Heart failure Mother    Hypertension Father    Colon cancer Father  81   Hypertension Brother    Heart disease Son    Heart disease Son    Colon cancer Paternal Uncle     Social History:  reports that he has quit smoking. His smoking use included cigarettes. He has never used smokeless tobacco. He reports that he does not drink alcohol and does not use drugs.  ROS: A complete review of systems was performed.  All systems are negative except for pertinent findings as noted.  Physical Exam:  Vital signs in last 24 hours: Temp:  [97.7 F (36.5 C)-99.2 F (37.3 C)] 97.7 F (36.5 C) (08/08 1345) Pulse Rate:  [76-97] 81 (08/08 1600) Resp:  [13-23] 16 (08/08 1600) BP: (111-134)/(64-75) 126/73 (08/08 1600) SpO2:  [94 %-100 %] 100 % (08/08 1600) Weight:  [69.2 kg] 69.2 kg (08/08 1044) Constitutional:  Alert and oriented, No acute distress Cardiovascular: Regular rate and rhythm Respiratory: Normal respiratory effort, Lungs clear bilaterally GI: Abdomen is soft, nontender, nondistended, no abdominal masses GU: No CVA tenderness Neurologic: Grossly intact, no focal deficits Psychiatric: Normal mood and affect  Laboratory Data:  Recent Labs    01/04/24 0952 01/04/24 0953 01/06/24 1215  WBC 7.3  --  18.0*  HGB 9.3* 10.2* 7.9*  HCT 29.0* 30.0* 24.6*  PLT 472*  --  474*    Recent Labs    01/04/24 0952 01/04/24 0953 01/06/24 1215  NA 134* 136 137  K 3.4* 3.4* 3.7  CL 101 99 101  GLUCOSE 140* 137* 90  BUN 12 12 27*  CALCIUM  8.5*  --  9.0  CREATININE 1.54* 1.50* 1.67*     Results for orders placed or performed during the hospital encounter of 01/06/24 (from the past 24 hours)  CBC with Differential     Status: Abnormal   Collection Time: 01/06/24 12:15 PM  Result Value Ref Range   WBC 18.0 (H) 4.0 - 10.5 K/uL   RBC 2.62 (L) 4.22 - 5.81 MIL/uL   Hemoglobin 7.9 (L) 13.0 - 17.0 g/dL   HCT 75.3 (L) 60.9 - 47.9 %   MCV 93.9 80.0 - 100.0 fL   MCH 30.2 26.0 - 34.0 pg   MCHC 32.1 30.0 - 36.0 g/dL   RDW 87.1 88.4 - 84.4 %   Platelets 474  (H) 150 - 400 K/uL   nRBC 0.0 0.0 - 0.2 %   Neutrophils Relative % 92 %   Neutro Abs 16.7 (H) 1.7 - 7.7 K/uL   Lymphocytes Relative 4 %  Lymphs Abs 0.7 0.7 - 4.0 K/uL   Monocytes Relative 3 %   Monocytes Absolute 0.5 0.1 - 1.0 K/uL   Eosinophils Relative 0 %   Eosinophils Absolute 0.0 0.0 - 0.5 K/uL   Basophils Relative 0 %   Basophils Absolute 0.0 0.0 - 0.1 K/uL   Immature Granulocytes 1 %   Abs Immature Granulocytes 0.10 (H) 0.00 - 0.07 K/uL  Basic metabolic panel     Status: Abnormal   Collection Time: 01/06/24 12:15 PM  Result Value Ref Range   Sodium 137 135 - 145 mmol/L   Potassium 3.7 3.5 - 5.1 mmol/L   Chloride 101 98 - 111 mmol/L   CO2 24 22 - 32 mmol/L   Glucose, Bld 90 70 - 99 mg/dL   BUN 27 (H) 8 - 23 mg/dL   Creatinine, Ser 8.32 (H) 0.61 - 1.24 mg/dL   Calcium  9.0 8.9 - 10.3 mg/dL   GFR, Estimated 46 (L) >60 mL/min   Anion gap 12 5 - 15   Recent Results (from the past 240 hours)  Blood culture (routine x 2)     Status: None (Preliminary result)   Collection Time: 01/04/24 12:42 PM   Specimen: BLOOD  Result Value Ref Range Status   Specimen Description BLOOD RIGHT ANTECUBITAL  Final   Special Requests   Final    BOTTLES DRAWN AEROBIC AND ANAEROBIC Blood Culture adequate volume   Culture   Final    NO GROWTH 2 DAYS Performed at [facility], [address], [place], [place] [address]    Report Status PENDING  Incomplete  Blood culture (routine x 2)     Status: Abnormal (Preliminary result)   Collection Time: 01/04/24  1:00 PM   Specimen: BLOOD RIGHT HAND  Result Value Ref Range Status   Specimen Description BLOOD RIGHT HAND  Final   Special Requests   Final    BOTTLES DRAWN AEROBIC AND ANAEROBIC Blood Culture adequate volume   Culture  Setup Time   Final    GRAM NEGATIVE RODS AEROBIC BOTTLE ONLY CRITICAL RESULT CALLED TO, READ BACK BY AND VERIFIED WITH: ED CHARGE RN OZELL CORDOBA [phone] AT 0959, ADC    Culture (A)  Final    ESCHERICHIA  COLI SUSCEPTIBILITIES TO FOLLOW Performed at [facility], [address], [place], [place] [address]    Report Status PENDING  Incomplete  Blood Culture ID Panel (Reflexed)     Status: Abnormal   Collection Time: 01/04/24  1:00 PM  Result Value Ref Range Status   Enterococcus faecalis NOT DETECTED NOT DETECTED Final   Enterococcus Faecium NOT DETECTED NOT DETECTED Final   Listeria monocytogenes NOT DETECTED NOT DETECTED Final   Staphylococcus species NOT DETECTED NOT DETECTED Final   Staphylococcus aureus (BCID) NOT DETECTED NOT DETECTED Final   Staphylococcus epidermidis NOT DETECTED NOT DETECTED Final   Staphylococcus lugdunensis NOT DETECTED NOT DETECTED Final   Streptococcus species NOT DETECTED NOT DETECTED Final   Streptococcus agalactiae NOT DETECTED NOT DETECTED Final   Streptococcus pneumoniae NOT DETECTED NOT DETECTED Final   Streptococcus pyogenes NOT DETECTED NOT DETECTED Final   A.calcoaceticus-baumannii NOT DETECTED NOT DETECTED Final   Bacteroides fragilis NOT DETECTED NOT DETECTED Final   Enterobacterales DETECTED (A) NOT DETECTED Final    Comment: Enterobacterales represent a large order of gram negative bacteria, not a single organism. CRITICAL RESULT CALLED TO, READ BACK BY AND VERIFIED WITH: ED CHARGE RN MICHAEL D. [phone] AT 0959, ADC  Enterobacter cloacae complex NOT DETECTED NOT DETECTED Final   Escherichia coli DETECTED (A) NOT DETECTED Final    Comment: CRITICAL RESULT CALLED TO, READ BACK BY AND VERIFIED WITH: ED CHARGE RN MICHAEL D. [phone] AT 0959, ADC    Klebsiella aerogenes NOT DETECTED NOT DETECTED Final   Klebsiella oxytoca NOT DETECTED NOT DETECTED Final   Klebsiella pneumoniae NOT DETECTED NOT DETECTED Final   Proteus species NOT DETECTED NOT DETECTED Final   Salmonella species NOT DETECTED NOT DETECTED Final   Serratia marcescens NOT DETECTED NOT DETECTED Final   Haemophilus influenzae NOT DETECTED NOT DETECTED Final   Neisseria  meningitidis NOT DETECTED NOT DETECTED Final   Pseudomonas aeruginosa NOT DETECTED NOT DETECTED Final   Stenotrophomonas maltophilia NOT DETECTED NOT DETECTED Final   Candida albicans NOT DETECTED NOT DETECTED Final   Candida auris NOT DETECTED NOT DETECTED Final   Candida glabrata NOT DETECTED NOT DETECTED Final   Candida krusei NOT DETECTED NOT DETECTED Final   Candida parapsilosis NOT DETECTED NOT DETECTED Final   Candida tropicalis NOT DETECTED NOT DETECTED Final   Cryptococcus neoformans/gattii NOT DETECTED NOT DETECTED Final   CTX-M ESBL NOT DETECTED NOT DETECTED Final   Carbapenem resistance IMP NOT DETECTED NOT DETECTED Final   Carbapenem resistance KPC NOT DETECTED NOT DETECTED Final   Carbapenem resistance NDM NOT DETECTED NOT DETECTED Final   Carbapenem resist OXA 48 LIKE NOT DETECTED NOT DETECTED Final   Carbapenem resistance VIM NOT DETECTED NOT DETECTED Final    Comment: Performed at [facility], [address], [place], [place] [address]    Renal Function: Recent Labs    01/04/24 9047 01/04/24 0953 01/06/24 1215  CREATININE 1.54* 1.50* 1.67*   Estimated Creatinine Clearance: 44.4 mL/min (A) (by C-G formula based on SCr of 1.67 mg/dL (H)).  Radiologic Imaging: CT ABDOMEN PELVIS W CONTRAST Result Date: 01/06/2024 CLINICAL DATA:  Abdominal pain, post-op. * Tracking Code: BO * EXAM: CT ABDOMEN AND PELVIS WITH CONTRAST TECHNIQUE: Multidetector CT imaging of the abdomen and pelvis was performed using the standard protocol following bolus administration of intravenous contrast. RADIATION DOSE REDUCTION: This exam was performed according to the departmental dose-optimization program which includes automated exposure control, adjustment of the mA and/or kV according to patient size and/or use of iterative reconstruction technique. CONTRAST:  75mL OMNIPAQUE  IOHEXOL  350 MG/ML SOLN COMPARISON:  CT scan abdomen and pelvis from 01/04/2024. FINDINGS: Lower chest: There are  subpleural atelectatic changes in the visualized lung bases. No overt consolidation. No pleural effusion. The heart is normal in size. No pericardial effusion. Hepatobiliary: The liver is normal in size. Non-cirrhotic configuration. No suspicious mass. No intrahepatic or extrahepatic bile duct dilation. There are new hyperattenuating areas in the dependent portion of the gallbladder, which may represent vicarious excretion of previously administered intravenous contrast. Normal gallbladder wall thickness. No pericholecystic inflammatory changes. Pancreas: Unremarkable. No pancreatic ductal dilatation or surrounding inflammatory changes. Spleen: Within normal limits. No focal lesion. Adrenals/Urinary Tract: Adrenal glands are unremarkable. No suspicious renal mass. No hydronephrosis. No renal or ureteric calculi. Urinary bladder is decompressed secondary to Foley catheter, which appears in satisfactory position Stomach/Bowel: No disproportionate dilation of the small or large bowel loops. Appendix is not distinctly visualized. Vascular/Lymphatic: Redemonstration of fluid collection in the dependent pelvis currently measuring up to 7.3 x 9.6 cm, increased since the prior study. The collection exhibits thin hyperattenuating incomplete walls. Findings again may represent postoperative seroma versus developing abscess. There is also redemonstration of fluid and small  amount of nondependent air in the space of Retzius, surrounding the urinary bladder as well as along the bilateral pelvic sidewalls, which is likely secondary to false passage/malposition of the Foley bulb seen on the prior exam. No abdominal or pelvic lymphadenopathy, by size criteria. No aneurysmal dilation of the major abdominal arteries. There are moderate peripheral atherosclerotic vascular calcifications of the aorta and its major branches. Reproductive: There is history of prostate resection. Other: Supraumbilical midline surgical scar noted.  Musculoskeletal: No suspicious osseous lesions. There are mild multilevel degenerative changes in the visualized spine. IMPRESSION: 1. Redemonstration of fluid collection in the dependent pelvis, which has increased since the prior study. The collection exhibits thin hyperattenuating incomplete walls. Findings again may represent postoperative seroma versus developing abscess. 2. There is also redemonstration of fluid and small amount of nondependent air in the Space of Retzius, surrounding the urinary bladder as well as along the bilateral pelvic sidewalls, which is likely secondary to false passage/malposition of the Foley bulb seen on the prior exam. 3. Multiple other nonacute observations, as described above. Aortic Atherosclerosis (ICD10-I70.0). Electronically Signed   By: Ree Molt M.D.   On: 01/06/2024 15:03    I independently reviewed the above imaging studies.  Impression/Recommendation Prostate cancer s/p robotic assisted laparoscopic radical prostatectomy and bilateral lymph node dissection on 12/26/2023 with [id number] GG2 disease Pelvic fluid collection Possible urine leak Bacteremia  -Appreciate medicine assistance. Continue IV abx. Repeat blood cultures pending -Keep foley catheter to gravity drainage. Again discussed with patient likelihood of urine leak following dislodged foley catheter.  -Given enlarging pelvic fluid collection and bacteremia, favor drain placement. Discussed with Frederic Specking with IR who will place pelvic drain tomorrow. Keep NPO after midnight. Hold anticoagulation. PT/INR, PTT pending. -Depending course, may consider cysto, bilateral externalized ureteral stent placement to maximally divert urine.  -Hopeful at attempt at conservative management. Again discussed with patient that if persistent, may require operative repair.  Matt R. Rochell Puett MD 01/06/2024, 4:49 PM  [facility]  Pager: [phone]

## 2024-01-06 NOTE — ED Notes (Signed)
 EKTA PATEL WILL BE THE AT ADMITTING DOCTOR

## 2024-01-07 ENCOUNTER — Inpatient Hospital Stay (HOSPITAL_COMMUNITY): Payer: Self-pay

## 2024-01-07 LAB — COMPREHENSIVE METABOLIC PANEL WITH GFR
ALT: 12 U/L (ref 0–44)
AST: 21 U/L (ref 15–41)
Albumin: 2.9 g/dL — ABNORMAL LOW (ref 3.5–5.0)
Alkaline Phosphatase: 52 U/L (ref 38–126)
Anion gap: 11 (ref 5–15)
BUN: 22 mg/dL (ref 8–23)
CO2: 24 mmol/L (ref 22–32)
Calcium: 8.6 mg/dL — ABNORMAL LOW (ref 8.9–10.3)
Chloride: 101 mmol/L (ref 98–111)
Creatinine, Ser: 1.38 mg/dL — ABNORMAL HIGH (ref 0.61–1.24)
GFR, Estimated: 58 mL/min — ABNORMAL LOW (ref >60)
Glucose, Bld: 87 mg/dL (ref 70–99)
Potassium: 4 mmol/L (ref 3.5–5.1)
Sodium: 136 mmol/L (ref 135–145)
Total Bilirubin: 1.2 mg/dL (ref 0.0–1.2)
Total Protein: 7.1 g/dL (ref 6.5–8.1)

## 2024-01-07 LAB — CREATININE, FLUID (PLEURAL, PERITONEAL, JP DRAINAGE): Creat, Fluid: 1.4 mg/dL

## 2024-01-07 LAB — CULTURE, BLOOD (ROUTINE X 2): Special Requests: ADEQUATE

## 2024-01-07 LAB — URINE CULTURE: Culture: <10000 — AB

## 2024-01-07 LAB — CBC
HCT: 25.7 % — ABNORMAL LOW (ref 39.0–52.0)
Hemoglobin: 8.1 g/dL — ABNORMAL LOW (ref 13.0–17.0)
MCH: 30 pg (ref 26.0–34.0)
MCHC: 31.5 g/dL (ref 30.0–36.0)
MCV: 95.2 fL (ref 80.0–100.0)
Platelets: 466 K/uL — ABNORMAL HIGH (ref 150–400)
RBC: 2.7 MIL/uL — ABNORMAL LOW (ref 4.22–5.81)
RDW: 13 % (ref 11.5–15.5)
WBC: 16.9 K/uL — ABNORMAL HIGH (ref 4.0–10.5)
nRBC: 0 % (ref 0.0–0.2)

## 2024-01-07 LAB — PROTIME-INR
INR: 1.2 (ref 0.8–1.2)
Prothrombin Time: 15.5 s — ABNORMAL HIGH (ref 11.4–15.2)

## 2024-01-07 LAB — CORTISOL-AM, BLOOD: Cortisol - AM: 25.7 ug/dL — ABNORMAL HIGH (ref 6.7–22.6)

## 2024-01-07 LAB — APTT: aPTT: 38 s — ABNORMAL HIGH (ref 24–36)

## 2024-01-07 MED ORDER — MIDAZOLAM HCL 2 MG/2ML IJ SOLN
INTRAMUSCULAR | Status: AC
  Filled 2024-01-07: qty 2

## 2024-01-07 MED ORDER — PANTOPRAZOLE SODIUM 40 MG PO TBEC
40.0000 mg | DELAYED_RELEASE_TABLET | Freq: Two times a day (BID) | ORAL | Status: DC
  Administered 2024-01-07 – 2024-01-12 (×18): 40 mg via ORAL
  Filled 2024-01-07 (×11): qty 1

## 2024-01-07 MED ORDER — FENTANYL CITRATE (PF) 100 MCG/2ML IJ SOLN
INTRAMUSCULAR | Status: AC | PRN
  Administered 2024-01-07 (×3): 25 ug via INTRAVENOUS
  Administered 2024-01-07: 50 ug via INTRAVENOUS
  Administered 2024-01-07: 25 ug via INTRAVENOUS
  Administered 2024-01-07: 50 ug via INTRAVENOUS

## 2024-01-07 MED ORDER — CHLORHEXIDINE GLUCONATE CLOTH 2 % EX PADS
6.0000 | MEDICATED_PAD | Freq: Every day | CUTANEOUS | Status: DC
  Administered 2024-01-07 – 2024-01-11 (×6): 6 via TOPICAL

## 2024-01-07 MED ORDER — FLUMAZENIL 0.5 MG/5ML IV SOLN
INTRAVENOUS | Status: AC
  Filled 2024-01-07: qty 5

## 2024-01-07 MED ORDER — MIDAZOLAM HCL 2 MG/2ML IJ SOLN
INTRAMUSCULAR | Status: AC | PRN
  Administered 2024-01-07: 1 mg via INTRAVENOUS
  Administered 2024-01-07 (×3): .5 mg via INTRAVENOUS
  Administered 2024-01-07: 1 mg via INTRAVENOUS

## 2024-01-07 MED ORDER — MIDAZOLAM HCL 2 MG/2ML IJ SOLN
INTRAMUSCULAR | Status: AC | PRN
  Administered 2024-01-07: .5 mg via INTRAVENOUS

## 2024-01-07 MED ORDER — FENTANYL CITRATE (PF) 100 MCG/2ML IJ SOLN
INTRAMUSCULAR | Status: AC
  Filled 2024-01-07: qty 2

## 2024-01-07 MED ORDER — IOHEXOL 300 MG/ML  SOLN
100.0000 mL | Freq: Once | INTRAMUSCULAR | Status: AC | PRN
  Administered 2024-01-07: 100 mL via INTRAVENOUS

## 2024-01-07 MED ORDER — HYDROCODONE-ACETAMINOPHEN 5-325 MG PO TABS
1.0000 | ORAL_TABLET | ORAL | Status: DC | PRN
  Administered 2024-01-08 – 2024-01-11 (×19): 1 via ORAL
  Filled 2024-01-07 (×13): qty 1

## 2024-01-07 MED ORDER — VANCOMYCIN HCL 1250 MG/250ML IV SOLN: 1250.0000 mg | INTRAVENOUS | Status: DC

## 2024-01-07 MED ORDER — NALOXONE HCL 0.4 MG/ML IJ SOLN
INTRAMUSCULAR | Status: AC
Start: 2024-01-07 — End: 2024-01-07
  Filled 2024-01-07: qty 1

## 2024-01-07 MED ORDER — IOHEXOL 300 MG/ML  SOLN
50.0000 mL | Freq: Once | INTRAMUSCULAR | Status: AC | PRN
  Administered 2024-01-07: 50 mL via INTRAVENOUS

## 2024-01-07 MED ORDER — ACETAMINOPHEN 325 MG PO TABS
650.0000 mg | ORAL_TABLET | Freq: Once | ORAL | Status: AC
  Administered 2024-01-07: 650 mg via ORAL
  Filled 2024-01-07: qty 2

## 2024-01-07 NOTE — Consult Note (Addendum)
 Urology Consult   Physician requesting consult: Prentice Medicus, MD   Reason for consult: Bacteremia, hx of prostate cancer s/p prostatectomy  History of Present Illness:  [name] is a 63 y.o. 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   Of note, intraoperatively, he had a negative urine leak test.  JP drain was removed POD1 prior to discharge with seroequivalent creatinine without evidence of urine leak.  He was discharged home on postoperative day 1 and presented on postoperative day 2 in the evening with complaints of poor catheter drainage, abdominal pain, nausea and emesis.  Attempts to irrigate the catheter were unsuccessful. At the time, it was apparent that the catheter had been dislodged distally. Catheter was deflated and advanced back into the bladder with immediate return of 1 L clear yellow urine.  Balloon was reinflated with 15 cc sterile water .  He was also found to have AKI with creatinine 5.  CT A/P 12/29/2023 revealed free fluid and air related to recent surgery however expected postoperative surgical changes and bladder decompressed with Foley catheter.  His creatinine down trended that day and he had excellent urine output.   Over the next week, he reported good urine output and this has been recorded well by his wife. He noted decreased urine output on 8/6 as well as lower abdominal pain.  He also had a syncopal episode at that time and presented to the ED.  CT head and MRI brain were obtained and were unremarkable. Of note, CT abdomen pelvis 01/04/2024 revealed fluid collection in the dependent pelvis measuring 4 x 7 cm as well as Foley catheter in place however balloon appeared to be inferior to the bladder on the left paramedian side.  The bladder was  mildly distended.  I removed Foley catheter on 8/6 and performed bedside cystoscopy and was able to navigate into the bladder where I passed a wire and then a council catheter over the wire.  Visualization was difficult and there is some suspicion for compromised vesicourethral anastomosis.  Foley catheter drained well and he was discharged home.  Blood cultures obtained on 8/6 resulted positive for E. coli and thus patient was called to return to the ED for IV antibiotics.  Patient reports he has been doing well over the past 48 hours.  He has had decreased abdominal pain.  He denies fevers.  He has been tolerating some diet.  His Foley catheter has been draining yellow urine.  Repeat CT A/P 01/06/2024 reveals redemonstration of pelvic fluid collection has increased in size now measuring 7 x 9 cm.  There is also some fluid around the space of Retzius.  Foley catheter is seen within the bladder and the bladder is decompressed.  Interval 8/9: AFVSS on room air, borderline low UoP, leukocytosis downtrending (16.9 from 18), Cr downtrending (1.38 from 1.67), 1/2 blood cultures from 8/6 with E. Coli, repeat urine and blood cultures pending. Overall feeling okay this morning with some abdominal discomfort   Past Medical History:  Diagnosis Date   Apical variant hypertrophic cardiomyopathy (HCC)    CAD (coronary artery disease)    a. LHC in 2011 with severe dz in D1/D2 and very distal LAD--> too small for PCI and managed medically.  b.  LHC in 10/2013 after an abnormal nuclear study w/ non-obst dz/c. 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   HOH  (hard of hearing)    rgiht ear   Hyperlipidemia    Hypertension    Myocardial infarction [facility])    Peripheral vascular disease (HCC)    Stroke [facility])     Past Surgical History:  Procedure Laterality Date   CARDIAC CATHETERIZATION N/A 02/24/2016   Procedure: Left Heart Cath and Coronary Angiography;  Surgeon: Ozell Fell, MD;  Location: [facility] INVASIVE CV LAB;  Service: Cardiovascular;  Laterality: N/A;   CARDIAC CATHETERIZATION N/A 02/24/2016   Procedure: Coronary Stent Intervention;  Surgeon: Ozell Fell, MD;  Location: [facility] INVASIVE CV LAB;  Service: Cardiovascular;  Laterality: N/A;   CORONARY ANGIOPLASTY WITH STENT PLACEMENT  2017   CORONARY BALLOON ANGIOPLASTY N/A 04/30/2019   Procedure: CORONARY BALLOON ANGIOPLASTY;  Surgeon: Swaziland, Peter M, MD;  Location: [facility] INVASIVE CV LAB;  Service: Cardiovascular;  Laterality: N/A;   INGUINAL HERNIA REPAIR Left    LEFT HEART CATH AND CORONARY ANGIOGRAPHY N/A 04/30/2019   Procedure: LEFT HEART CATH AND CORONARY ANGIOGRAPHY;  Surgeon: Cherrie Toribio SAUNDERS, MD;  Location: [facility] INVASIVE CV LAB;  Service: Cardiovascular;  Laterality: N/A;   LEFT HEART CATH AND CORONARY ANGIOGRAPHY N/A 05/14/2019   Procedure: LEFT HEART CATH AND CORONARY ANGIOGRAPHY;  Surgeon: Swaziland, Peter M, MD;  Location: [facility] INVASIVE CV LAB;  Service: Cardiovascular;  Laterality: N/A;   LEFT HEART CATHETERIZATION WITH CORONARY ANGIOGRAM N/A 11/22/2013   Procedure: LEFT HEART CATHETERIZATION WITH CORONARY ANGIOGRAM;  Surgeon: Victory LELON Claudene DOUGLAS, MD;  Location: [facility] CATH LAB;  Service: Cardiovascular;  Laterality: N/A;   ROBOT ASSISTED LAPAROSCOPIC RADICAL PROSTATECTOMY N/A 12/26/2023   Procedure: PROSTATECTOMY, RADICAL, ROBOT-ASSISTED, LAPAROSCOPIC;  Surgeon: Selma Donnice SAUNDERS, MD;  Location: [facility] ORS;  Service: Urology;  Laterality: N/A;   TEE WITHOUT CARDIOVERSION N/A 08/22/2015   Procedure: TRANSESOPHAGEAL ECHOCARDIOGRAM (TEE);  Surgeon: Aleene JINNY Passe, MD;  Location: [facility] ENDOSCOPY;  Service:  Cardiovascular;  Laterality: N/A;    Current Hospital Medications:  Home Meds:  No current facility-administered medications on file prior to encounter.   Current Outpatient Medications on File Prior to Encounter  Medication Sig Dispense Refill   diphenhydramine -acetaminophen  (TYLENOL  PM) 25-500 MG TABS tablet Take 1 tablet by mouth at bedtime as needed (for pain,sleep).     docusate sodium  (COLACE) 100 MG capsule Take 1 capsule (100 mg total) by mouth 2 (two) times daily.     DULoxetine  (CYMBALTA ) 60 MG capsule Take 1 capsule (60 mg total) by mouth daily. For chronic pain 90 capsule 1   Evolocumab  (REPATHA  SURECLICK) 140 MG/ML SOAJ INJECT 140 MG INTO THE SKIN EVERY 14 (FOURTEEN) DAYS. 6 mL 3   HYDROcodone -acetaminophen  (NORCO/VICODIN) 5-325 MG tablet Take 1-2 tablets by mouth every 6 (six) hours as needed for moderate pain (pain score 4-6) or severe pain (pain score 7-10). 20 tablet 0   lactulose  (CHRONULAC ) 10 GM/15ML solution Take 15 mLs (10 g total) by mouth 3 (three) times daily. 946 mL 0   losartan -hydrochlorothiazide  (HYZAAR ) 100-25 MG tablet Take 1 tablet by mouth daily. 90 tablet 1  ranolazine  (RANEXA ) 500 MG 12 hr tablet Take 1 tablet (500 mg total) by mouth 2 (two) times daily. 180 tablet 1   rosuvastatin  (CRESTOR ) 40 MG tablet Take 1 tablet (40 mg total) by mouth daily. 90 tablet 1   amLODipine  (NORVASC ) 10 MG tablet Take 1 tablet (10 mg total) by mouth daily. 90 tablet 1   nitroGLYCERIN  (NITROSTAT ) 0.4 MG SL tablet Place 1 tablet (0.4 mg total) under the tongue every 5 (five) minutes as needed for chest pain. 25 tablet 3     Scheduled Meds:  Chlorhexidine  Gluconate Cloth  6 each Topical Daily   DULoxetine   60 mg Oral Daily   feeding supplement  237 mL Oral BID BM   pantoprazole  (PROTONIX ) IV  40 mg Intravenous Q12H   rosuvastatin   40 mg Oral Daily   vancomycin  variable dose per unstable renal function (pharmacist dosing)   Does not apply See admin instructions    Continuous Infusions:  cefTRIAXone  (ROCEPHIN )  IV     PRN Meds:.mouth rinse  Allergies:  Allergies  Allergen Reactions   Lisinopril Swelling    lips swelling    Family History  Problem Relation Age of Onset   Hypertension Mother    Heart failure Mother    Hypertension Father    Colon cancer Father 100   Hypertension Brother    Heart disease Son    Heart disease Son    Colon cancer Paternal Uncle     Social History:  reports that he has quit smoking. His smoking use included cigarettes. He has never used smokeless tobacco. He reports that he does not drink alcohol and does not use drugs.  ROS: A complete review of systems was performed.  All systems are negative except for pertinent findings as noted.  Physical Exam:  Vital signs in last 24 hours: Temp:  [97.7 F (36.5 C)-100 F (37.8 C)] 100 F (37.8 C) (08/09 0258) Pulse Rate:  [73-97] 78 (08/09 0500) Resp:  [13-23] 19 (08/09 0500) BP: (102-153)/(61-81) 150/61 (08/09 0500) SpO2:  [91 %-100 %] 100 % (08/09 0500) Weight:  [69.2 kg] 69.2 kg (08/08 1044) Constitutional:  Alert and oriented, No acute distress Cardiovascular: Regular rate and rhythm Respiratory: Normal respiratory effort, Lungs clear bilaterally GI: Abdomen is soft, mildly tender, nondistended, no abdominal masses GU: No CVA tenderness, foley catheter in place with light pink urine Neurologic: Grossly intact, no focal deficits Psychiatric: Normal mood and affect  Laboratory Data:  Recent Labs    01/04/24 0952 01/04/24 0953 01/06/24 1215 01/07/24 0300  WBC 7.3  --  18.0* 16.9*  HGB 9.3* 10.2* 7.9* 8.1*  HCT 29.0* 30.0* 24.6* 25.7*  PLT 472*  --  474* 466*    Recent Labs    01/04/24 0952 01/04/24 0953 01/06/24 1215 01/07/24 0300  NA 134* 136 137 136  K 3.4* 3.4* 3.7 4.0  CL 101 99 101 101  GLUCOSE 140* 137* 90 87  BUN 12 12 27* 22  CALCIUM  8.5*  --  9.0 8.6*  CREATININE 1.54* 1.50* 1.67* 1.38*     Results for orders placed or  performed during the hospital encounter of 01/06/24 (from the past 24 hours)  CBC with Differential     Status: Abnormal   Collection Time: 01/06/24 12:15 PM  Result Value Ref Range   WBC 18.0 (H) 4.0 - 10.5 K/uL   RBC 2.62 (L) 4.22 - 5.81 MIL/uL   Hemoglobin 7.9 (L) 13.0 - 17.0 g/dL   HCT 75.3 (L) 60.9 -  52.0 %   MCV 93.9 80.0 - 100.0 fL   MCH 30.2 26.0 - 34.0 pg   MCHC 32.1 30.0 - 36.0 g/dL   RDW 87.1 88.4 - 84.4 %   Platelets 474 (H) 150 - 400 K/uL   nRBC 0.0 0.0 - 0.2 %   Neutrophils Relative % 92 %   Neutro Abs 16.7 (H) 1.7 - 7.7 K/uL   Lymphocytes Relative 4 %   Lymphs Abs 0.7 0.7 - 4.0 K/uL   Monocytes Relative 3 %   Monocytes Absolute 0.5 0.1 - 1.0 K/uL   Eosinophils Relative 0 %   Eosinophils Absolute 0.0 0.0 - 0.5 K/uL   Basophils Relative 0 %   Basophils Absolute 0.0 0.0 - 0.1 K/uL   Immature Granulocytes 1 %   Abs Immature Granulocytes 0.10 (H) 0.00 - 0.07 K/uL  Basic metabolic panel     Status: Abnormal   Collection Time: 01/06/24 12:15 PM  Result Value Ref Range   Sodium 137 135 - 145 mmol/L   Potassium 3.7 3.5 - 5.1 mmol/L   Chloride 101 98 - 111 mmol/L   CO2 24 22 - 32 mmol/L   Glucose, Bld 90 70 - 99 mg/dL   BUN 27 (H) 8 - 23 mg/dL   Creatinine, Ser 8.32 (H) 0.61 - 1.24 mg/dL   Calcium  9.0 8.9 - 10.3 mg/dL   GFR, Estimated 46 (L) >60 mL/min   Anion gap 12 5 - 15  Lactic acid, plasma     Status: None   Collection Time: 01/06/24  3:44 PM  Result Value Ref Range   Lactic Acid, Venous 0.9 0.5 - 1.9 mmol/L  Hepatic function panel     Status: Abnormal   Collection Time: 01/06/24  3:46 PM  Result Value Ref Range   Total Protein 6.4 (L) 6.5 - 8.1 g/dL   Albumin  2.7 (L) 3.5 - 5.0 g/dL   AST 19 15 - 41 U/L   ALT 13 0 - 44 U/L   Alkaline Phosphatase 49 38 - 126 U/L   Total Bilirubin 0.8 0.0 - 1.2 mg/dL   Bilirubin, Direct 0.1 0.0 - 0.2 mg/dL   Indirect Bilirubin 0.7 0.3 - 0.9 mg/dL  HIV Antibody (routine testing w rflx)     Status: None   Collection Time:  01/06/24  3:46 PM  Result Value Ref Range   HIV Screen 4th Generation wRfx Non Reactive Non Reactive  Lactic acid, plasma     Status: None   Collection Time: 01/06/24  6:49 PM  Result Value Ref Range   Lactic Acid, Venous 0.9 0.5 - 1.9 mmol/L  Protime-INR     Status: Abnormal   Collection Time: 01/07/24  3:00 AM  Result Value Ref Range   Prothrombin Time 15.5 (H) 11.4 - 15.2 seconds   INR 1.2 0.8 - 1.2  Cortisol-am, blood     Status: Abnormal   Collection Time: 01/07/24  3:00 AM  Result Value Ref Range   Cortisol - AM 25.7 (H) 6.7 - 22.6 ug/dL  CBC     Status: Abnormal   Collection Time: 01/07/24  3:00 AM  Result Value Ref Range   WBC 16.9 (H) 4.0 - 10.5 K/uL   RBC 2.70 (L) 4.22 - 5.81 MIL/uL   Hemoglobin 8.1 (L) 13.0 - 17.0 g/dL   HCT 74.2 (L) 60.9 - 47.9 %   MCV 95.2 80.0 - 100.0 fL   MCH 30.0 26.0 - 34.0 pg   MCHC 31.5 30.0 - 36.0  g/dL   RDW 86.9 88.4 - 84.4 %   Platelets 466 (H) 150 - 400 K/uL   nRBC 0.0 0.0 - 0.2 %  Comprehensive metabolic panel     Status: Abnormal   Collection Time: 01/07/24  3:00 AM  Result Value Ref Range   Sodium 136 135 - 145 mmol/L   Potassium 4.0 3.5 - 5.1 mmol/L   Chloride 101 98 - 111 mmol/L   CO2 24 22 - 32 mmol/L   Glucose, Bld 87 70 - 99 mg/dL   BUN 22 8 - 23 mg/dL   Creatinine, Ser 8.61 (H) 0.61 - 1.24 mg/dL   Calcium  8.6 (L) 8.9 - 10.3 mg/dL   Total Protein 7.1 6.5 - 8.1 g/dL   Albumin  2.9 (L) 3.5 - 5.0 g/dL   AST 21 15 - 41 U/L   ALT 12 0 - 44 U/L   Alkaline Phosphatase 52 38 - 126 U/L   Total Bilirubin 1.2 0.0 - 1.2 mg/dL   GFR, Estimated 58 (L) >60 mL/min   Anion gap 11 5 - 15  APTT     Status: Abnormal   Collection Time: 01/07/24  3:00 AM  Result Value Ref Range   aPTT 38 (H) 24 - 36 seconds   Recent Results (from the past 240 hours)  Blood culture (routine x 2)     Status: None (Preliminary result)   Collection Time: 01/04/24 12:42 PM   Specimen: BLOOD  Result Value Ref Range Status   Specimen Description BLOOD RIGHT  ANTECUBITAL  Final   Special Requests   Final    BOTTLES DRAWN AEROBIC AND ANAEROBIC Blood Culture adequate volume   Culture   Final    NO GROWTH 2 DAYS Performed at [facility], [address] [place], [place] [address]    Report Status PENDING  Incomplete  Blood culture (routine x 2)     Status: Abnormal (Preliminary result)   Collection Time: 01/04/24  1:00 PM   Specimen: BLOOD RIGHT HAND  Result Value Ref Range Status   Specimen Description BLOOD RIGHT HAND  Final   Special Requests   Final    BOTTLES DRAWN AEROBIC AND ANAEROBIC Blood Culture adequate volume   Culture  Setup Time   Final    GRAM NEGATIVE RODS AEROBIC BOTTLE ONLY CRITICAL RESULT CALLED TO, READ BACK BY AND VERIFIED WITH: ED CHARGE RN OZELL CORDOBA [phone] AT 0959, ADC    Culture (A)  Final    ESCHERICHIA COLI SUSCEPTIBILITIES TO FOLLOW Performed at [facility], [address] [place], [place] [address]    Report Status PENDING  Incomplete  Blood Culture ID Panel (Reflexed)     Status: Abnormal   Collection Time: 01/04/24  1:00 PM  Result Value Ref Range Status   Enterococcus faecalis NOT DETECTED NOT DETECTED Final   Enterococcus Faecium NOT DETECTED NOT DETECTED Final   Listeria monocytogenes NOT DETECTED NOT DETECTED Final   Staphylococcus species NOT DETECTED NOT DETECTED Final   Staphylococcus aureus (BCID) NOT DETECTED NOT DETECTED Final   Staphylococcus epidermidis NOT DETECTED NOT DETECTED Final   Staphylococcus lugdunensis NOT DETECTED NOT DETECTED Final   Streptococcus species NOT DETECTED NOT DETECTED Final   Streptococcus agalactiae NOT DETECTED NOT DETECTED Final   Streptococcus pneumoniae NOT DETECTED NOT DETECTED Final   Streptococcus pyogenes NOT DETECTED NOT DETECTED Final   A.calcoaceticus-baumannii NOT DETECTED NOT DETECTED Final   Bacteroides fragilis NOT DETECTED NOT DETECTED Final   Enterobacterales DETECTED (  A) NOT DETECTED Final    Comment: Enterobacterales represent a  large order of gram negative bacteria, not a single organism. CRITICAL RESULT CALLED TO, READ BACK BY AND VERIFIED WITH: ED CHARGE RN MICHAEL D. [phone] AT [phone], ADC    Enterobacter cloacae complex NOT DETECTED NOT DETECTED Final   Escherichia coli DETECTED (A) NOT DETECTED Final    Comment: CRITICAL RESULT CALLED TO, READ BACK BY AND VERIFIED WITH: ED CHARGE RN MICHAEL D. [phone] AT 0959, ADC    Klebsiella aerogenes NOT DETECTED NOT DETECTED Final   Klebsiella oxytoca NOT DETECTED NOT DETECTED Final   Klebsiella pneumoniae NOT DETECTED NOT DETECTED Final   Proteus species NOT DETECTED NOT DETECTED Final   Salmonella species NOT DETECTED NOT DETECTED Final   Serratia marcescens NOT DETECTED NOT DETECTED Final   Haemophilus influenzae NOT DETECTED NOT DETECTED Final   Neisseria meningitidis NOT DETECTED NOT DETECTED Final   Pseudomonas aeruginosa NOT DETECTED NOT DETECTED Final   Stenotrophomonas maltophilia NOT DETECTED NOT DETECTED Final   Candida albicans NOT DETECTED NOT DETECTED Final   Candida auris NOT DETECTED NOT DETECTED Final   Candida glabrata NOT DETECTED NOT DETECTED Final   Candida krusei NOT DETECTED NOT DETECTED Final   Candida parapsilosis NOT DETECTED NOT DETECTED Final   Candida tropicalis NOT DETECTED NOT DETECTED Final   Cryptococcus neoformans/gattii NOT DETECTED NOT DETECTED Final   CTX-M ESBL NOT DETECTED NOT DETECTED Final   Carbapenem resistance IMP NOT DETECTED NOT DETECTED Final   Carbapenem resistance KPC NOT DETECTED NOT DETECTED Final   Carbapenem resistance NDM NOT DETECTED NOT DETECTED Final   Carbapenem resist OXA 48 LIKE NOT DETECTED NOT DETECTED Final   Carbapenem resistance VIM NOT DETECTED NOT DETECTED Final    Comment: Performed at [facility], [address], [place], [place] [address]    Renal Function: Recent Labs    01/04/24 9047 01/04/24 0953 01/06/24 1215 01/07/24 0300  CREATININE 1.54* 1.50* 1.67* 1.38*   Estimated  Creatinine Clearance: 53.7 mL/min (A) (by C-G formula based on SCr of 1.38 mg/dL (H)).  Radiologic Imaging: CT ABDOMEN PELVIS W CONTRAST Result Date: 01/06/2024 CLINICAL DATA:  Abdominal pain, post-op. * Tracking Code: BO * EXAM: CT ABDOMEN AND PELVIS WITH CONTRAST TECHNIQUE: Multidetector CT imaging of the abdomen and pelvis was performed using the standard protocol following bolus administration of intravenous contrast. RADIATION DOSE REDUCTION: This exam was performed according to the departmental dose-optimization program which includes automated exposure control, adjustment of the mA and/or kV according to patient size and/or use of iterative reconstruction technique. CONTRAST:  75mL OMNIPAQUE  IOHEXOL  350 MG/ML SOLN COMPARISON:  CT scan abdomen and pelvis from 01/04/2024. FINDINGS: Lower chest: There are subpleural atelectatic changes in the visualized lung bases. No overt consolidation. No pleural effusion. The heart is normal in size. No pericardial effusion. Hepatobiliary: The liver is normal in size. Non-cirrhotic configuration. No suspicious mass. No intrahepatic or extrahepatic bile duct dilation. There are new hyperattenuating areas in the dependent portion of the gallbladder, which may represent vicarious excretion of previously administered intravenous contrast. Normal gallbladder wall thickness. No pericholecystic inflammatory changes. Pancreas: Unremarkable. No pancreatic ductal dilatation or surrounding inflammatory changes. Spleen: Within normal limits. No focal lesion. Adrenals/Urinary Tract: Adrenal glands are unremarkable. No suspicious renal mass. No hydronephrosis. No renal or ureteric calculi. Urinary bladder is decompressed secondary to Foley catheter, which appears in satisfactory position Stomach/Bowel: No disproportionate dilation of the small or large bowel loops. Appendix is not distinctly visualized.  Vascular/Lymphatic: Redemonstration of fluid collection in the dependent pelvis  currently measuring up to 7.3 x 9.6 cm, increased since the prior study. The collection exhibits thin hyperattenuating incomplete walls. Findings again may represent postoperative seroma versus developing abscess. There is also redemonstration of fluid and small amount of nondependent air in the space of Retzius, surrounding the urinary bladder as well as along the bilateral pelvic sidewalls, which is likely secondary to false passage/malposition of the Foley bulb seen on the prior exam. No abdominal or pelvic lymphadenopathy, by size criteria. No aneurysmal dilation of the major abdominal arteries. There are moderate peripheral atherosclerotic vascular calcifications of the aorta and its major branches. Reproductive: There is history of prostate resection. Other: Supraumbilical midline surgical scar noted. Musculoskeletal: No suspicious osseous lesions. There are mild multilevel degenerative changes in the visualized spine. IMPRESSION: 1. Redemonstration of fluid collection in the dependent pelvis, which has increased since the prior study. The collection exhibits thin hyperattenuating incomplete walls. Findings again may represent postoperative seroma versus developing abscess. 2. There is also redemonstration of fluid and small amount of nondependent air in the Space of Retzius, surrounding the urinary bladder as well as along the bilateral pelvic sidewalls, which is likely secondary to false passage/malposition of the Foley bulb seen on the prior exam. 3. Multiple other nonacute observations, as described above. Aortic Atherosclerosis (ICD10-I70.0). Electronically Signed   By: Ree Molt M.D.   On: 01/06/2024 15:03    I independently reviewed the above imaging studies.  Impression/Recommendation Prostate cancer s/p robotic assisted laparoscopic radical prostatectomy and bilateral lymph node dissection on 12/26/2023 with [id number] GG2 disease Pelvic fluid collection Possible urine  leak Bacteremia  -Appreciate medicine assistance. Continue IV abx. Repeat blood cultures pending -Keep foley catheter to gravity drainage. Again discussed with patient likelihood of urine leak following dislodged foley catheter.  -Given enlarging pelvic fluid collection and bacteremia, favor drain placement. Plan for drain placement today per prior discussion with VIR, continue NPO until after procedure -Depending course, may consider cysto, bilateral externalized ureteral stent placement to maximally divert urine.  -Hopeful at attempt at conservative management. Again discussed with patient that if persistent, may require operative repair.  Jacqulyn Bound, MD PGY4 Urology 01/07/2024, 6:29 AM  [facility]  Pager: [phone]  Seen after drain placement. Pt in good spirits. No complaints. Afebrile. Exam stable. Soft, ND, non-tender. Pelvic drain appears to be clear yellow urine.  -Appreciate IR assistance with pelvic drain. Send JP creatinine. -Will plan for delayed phase cross sectional imaging to evaluate distal ureters as well as formal cystogram to delinate leak.   -Discussed findings with family.  -Make NPO at midnight -Considering cysto, externalized ureteral catheters and if large defect, possible robotic repair pending OR availability tomorrow or Monday.  Matt R. Keaghan Staton MD [facility]  Pager: [phone]

## 2024-01-07 NOTE — Consult Note (Signed)
 Chief Complaint: Patient was seen in consultation today for pelvic fluid collection, with consideration for drainage.  Referring Provider(s): Dr. Donnice Siad, MD   Supervising Physician: Philip Cornet  Patient Status: [facility] - In-pt  Patient is Full Code  History of Present Illness: [name] is a 63 y.o. male  with PMHx notable for prostate cancer s/p robotic assisted laparoscopic radical prostatectomy, HTN, HLD, CAD w/ MI, stroke, PVD, and back pain.   Patient is s/p favorable intermediate risk prostate cancer s/p robotic assisted laparoscopic radical prostatectomy and bilateral pelvic lymph node dissection on 12/26/2023. He was discharged on POD1, but returned to ED on 7:30, found with dislodge Foley catheter and urinary retention. Catheter was advanced with spontaneous drainage of 1L of urine. Over the next week, he reported good urine output. Unfortunately, he noted decreased urine output on 8/6 as well as lower abdominal pain with a syncopal episode at that time. ED neuro workup was negative, but CT abdomen pelvis was notable for fluid collection in the dependent pelvis measuring 4 x 7 cm. Dr. Siad performed bedside cystoscopy, noting some suspicion for compromised vesicourethral anastomosis.  Foley catheter was replaced and he was discharged home. However, blood cultures obtained on 8/6 resulted positive for E. coli and thus patient was called to return to the ED for IV antibiotics. Repeat CT A/P 01/06/2024 reveals redemonstration of pelvic fluid collection has increased in size now measuring 7 x 9 cm.    Interventional Radiology was requested for pelvic fluid collection drainage. Request was reviewed and approved by Dr. Vanice. Patient is scheduled for same in IR today.   Patient is alert and laying in bed, calm. Wife is at bedside. Patient is currently without any significant complaints. He does note back pain and bothersome midline lower abdominal pain, intermittent.  Patient denies  any fevers, headache, chest pain, SOB, cough, nausea, vomiting or bleeding.    Past Medical History:  Diagnosis Date   Apical variant hypertrophic cardiomyopathy (HCC)    CAD (coronary artery disease)    a. LHC in 2011 with severe dz in D1/D2 and very distal LAD--> too small for PCI and managed medically.  b.  LHC in 10/2013 after an abnormal nuclear study w/ non-obst dz/c. 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   HOH (hard of hearing)    rgiht ear   Hyperlipidemia    Hypertension    Myocardial infarction [facility])    Peripheral vascular disease (HCC)    Stroke [facility])     Past Surgical History:  Procedure Laterality Date   CARDIAC CATHETERIZATION N/A 02/24/2016   Procedure: Left Heart Cath and Coronary Angiography;  Surgeon: Ozell Fell, MD;  Location: [facility] INVASIVE CV LAB;  Service: Cardiovascular;  Laterality: N/A;   CARDIAC CATHETERIZATION N/A 02/24/2016   Procedure: Coronary Stent Intervention;  Surgeon: Ozell Fell, MD;  Location: [facility] INVASIVE CV LAB;  Service: Cardiovascular;  Laterality: N/A;   CORONARY ANGIOPLASTY WITH STENT PLACEMENT  2017   CORONARY BALLOON ANGIOPLASTY N/A 04/30/2019   Procedure: CORONARY BALLOON ANGIOPLASTY;  Surgeon: [place]az[place],  Maude HERO, MD;  Location: [facility] INVASIVE CV LAB;  Service: Cardiovascular;  Laterality: N/A;   INGUINAL HERNIA REPAIR Left    LEFT HEART CATH AND CORONARY ANGIOGRAPHY N/A 04/30/2019   Procedure: LEFT HEART CATH AND CORONARY ANGIOGRAPHY;  Surgeon: Cherrie Toribio SAUNDERS, MD;  Location: [facility] INVASIVE CV LAB;  Service: Cardiovascular;  Laterality: N/A;   LEFT HEART CATH AND CORONARY  ANGIOGRAPHY N/A 05/14/2019   Procedure: LEFT HEART CATH AND CORONARY ANGIOGRAPHY;  Surgeon: Swaziland, Peter M, MD;  Location: [facility] INVASIVE CV LAB;  Service: Cardiovascular;  Laterality: N/A;   LEFT HEART CATHETERIZATION WITH CORONARY ANGIOGRAM N/A 11/22/2013   Procedure: LEFT HEART CATHETERIZATION WITH CORONARY ANGIOGRAM;  Surgeon: Victory LELON Claudene DOUGLAS, MD;  Location: [facility] CATH LAB;  Service: Cardiovascular;  Laterality: N/A;   ROBOT ASSISTED LAPAROSCOPIC RADICAL PROSTATECTOMY N/A 12/26/2023   Procedure: PROSTATECTOMY, RADICAL, ROBOT-ASSISTED, LAPAROSCOPIC;  Surgeon: Selma Donnice SAUNDERS, MD;  Location: [facility] ORS;  Service: Urology;  Laterality: N/A;   TEE WITHOUT CARDIOVERSION N/A 08/22/2015   Procedure: TRANSESOPHAGEAL ECHOCARDIOGRAM (TEE);  Surgeon: Aleene JINNY Passe, MD;  Location: [facility] ENDOSCOPY;  Service: Cardiovascular;  Laterality: N/A;    Allergies: Lisinopril  Medications: Prior to Admission medications   Medication Sig Start Date End Date Taking? Authorizing Provider  diphenhydramine -acetaminophen  (TYLENOL  PM) 25-500 MG TABS tablet Take 1 tablet by mouth at bedtime as needed (for pain,sleep).   Yes [provider]  docusate sodium  (COLACE) 100 MG capsule Take 1 capsule (100 mg total) by mouth 2 (two) times daily. 12/26/23  Yes Dancy, Alan, PA-C  DULoxetine  (CYMBALTA ) 60 MG capsule Take 1 capsule (60 mg total) by mouth daily. For chronic pain 11/08/23  Yes Newlin, Enobong, MD  Evolocumab  (REPATHA  SURECLICK) 140 MG/ML SOAJ INJECT 140 MG INTO THE SKIN EVERY 14 (FOURTEEN) DAYS. 09/26/23  Yes Chandrasekhar, Mahesh A, MD  HYDROcodone -acetaminophen  (NORCO/VICODIN) 5-325 MG tablet Take 1-2 tablets by mouth every 6 (six) hours as needed for moderate pain (pain score 4-6) or severe pain (pain score 7-10). 12/26/23  Yes Dancy, Alan, PA-C  lactulose  (CHRONULAC ) 10 GM/15ML solution Take 15 mLs (10 g total) by mouth 3 (three) times daily. 12/29/23  Yes Newlin, Enobong, MD  losartan -hydrochlorothiazide  (HYZAAR )  100-25 MG tablet Take 1 tablet by mouth daily. 11/08/23  Yes Newlin, Enobong, MD  ranolazine  (RANEXA ) 500 MG 12 hr tablet Take 1 tablet (500 mg total) by mouth 2 (two) times daily. 08/23/23  Yes Newlin, Enobong, MD  rosuvastatin  (CRESTOR ) 40 MG tablet Take 1 tablet (40 mg total) by mouth daily. 08/23/23  Yes Newlin, Enobong, MD  amLODipine  (NORVASC ) 10 MG tablet Take 1 tablet (10 mg total) by mouth daily. 08/23/23   Newlin, Enobong, MD  nitroGLYCERIN  (NITROSTAT ) 0.4 MG SL tablet Place 1 tablet (0.4 mg total) under the tongue every 5 (five) minutes as needed for chest pain. 12/29/23   Newlin, Enobong, MD     Family History  Problem Relation Age of Onset   Hypertension Mother    Heart failure Mother    Hypertension Father    Colon cancer Father 23   Hypertension Brother    Heart disease Son    Heart disease Son    Colon cancer Paternal Uncle     Social History   Socioeconomic History   Marital status: Married    Spouse name: Not on file   Number of children: 3   Years of education: Not on file   Highest education level: Not on file  Occupational History   Occupation:  [employer]  Tobacco Use   Smoking status: Former    Current packs/day: 1.50    Types: Cigarettes   Smokeless tobacco: Never  Vaping Use   Vaping status: Never Used  Substance and Sexual Activity   Alcohol use: No    Alcohol/week: 0.0 standard drinks of alcohol   Drug use: No   Sexual activity: Not Currently  Other Topics Concern   Not on file  Social History Narrative   18 GRANDKIDS!!!   Social Drivers of Health   Financial Resource Strain: Medium Risk (03/09/2023)   Overall Financial Resource Strain (CARDIA)    Difficulty of Paying Living Expenses: Somewhat hard  Food Insecurity: No Food Insecurity (01/06/2024)   Hunger Vital Sign    Worried About Running Out of Food in the Last Year: Never true    Ran Out of Food in the Last Year: Never true  Recent Concern: Food Insecurity - Food Insecurity  Present (12/26/2023)   Hunger Vital Sign    Worried About Running Out of Food in the Last Year: Often true    Ran Out of Food in the Last Year: Often true  Transportation Needs: No Transportation Needs (01/06/2024)   PRAPARE - Administrator, Civil Service (Medical): No    Lack of Transportation (Non-Medical): No  Physical Activity: Inactive (03/09/2023)   Exercise Vital Sign    Days of Exercise per Week: 0 days    Minutes of Exercise per Session: 0 min  Stress: Stress Concern Present (03/09/2023)   [employer] of Occupational Health - Occupational Stress Questionnaire    Feeling of Stress : To some extent  Social Connections: Moderately Isolated (12/26/2023)   Social Connection and Isolation Panel    Frequency of Communication with Friends and Family: Once a week    Frequency of Social Gatherings with Friends and Family: Never    Attends Religious Services: Never    Database administrator or Organizations: Yes    Attends Engineer, structural: 1 to 4 times per year    Marital Status: Married     Review of Systems: A 12 point ROS discussed and pertinent positives are indicated in the HPI above.  All other systems are negative.  Vital Signs: BP (!) 151/75   Pulse 85   Temp 99.5 F (37.5 C) (Oral)   Resp 19   Ht 5' 8 (1.727 m)   Wt 152 lb 8.9 oz (69.2 kg)   SpO2 97%   BMI 23.20 kg/m   Advance Care Plan: The advanced care place/surrogate decision maker was discussed at the time of visit and the patient did not wish to discuss or was not able to name a surrogate decision maker or provide an advance care plan.  Physical Exam Vitals reviewed.  Constitutional:      General: He is not in acute distress.    Appearance: Normal appearance.  HENT:     Mouth/Throat:     Mouth: Mucous membranes are dry.  Cardiovascular:     Rate and Rhythm: Normal rate and regular rhythm.     Pulses: Normal pulses.     Heart sounds: Normal heart sounds.  Pulmonary:      Effort: Pulmonary effort is normal.     Breath sounds: Normal breath sounds.  Abdominal:     General: Abdomen is flat.     Palpations: Abdomen is soft.     Tenderness: There is abdominal tenderness.     Comments: Recent laparoscopic  trocar incision sites tender within expected stage of healing, without concern. Lower abdominopelvic pain, midline, compatible with bladder spasms.  Musculoskeletal:        General: Normal range of motion.     Cervical back: Normal range of motion.     Comments: Chronic lower back pain  Skin:    General: Skin is warm and dry.  Neurological:     Mental Status: He is alert and oriented to person, place, and time.  Psychiatric:        Mood and Affect: Mood normal.        Behavior: Behavior normal.        Thought Content: Thought content normal.        Judgment: Judgment normal.     Imaging: CT ABDOMEN PELVIS W CONTRAST Result Date: 01/06/2024 CLINICAL DATA:  Abdominal pain, post-op. * Tracking Code: BO * EXAM: CT ABDOMEN AND PELVIS WITH CONTRAST TECHNIQUE: Multidetector CT imaging of the abdomen and pelvis was performed using the standard protocol following bolus administration of intravenous contrast. RADIATION DOSE REDUCTION: This exam was performed according to the departmental dose-optimization program which includes automated exposure control, adjustment of the mA and/or kV according to patient size and/or use of iterative reconstruction technique. CONTRAST:  75mL OMNIPAQUE  IOHEXOL  350 MG/ML SOLN COMPARISON:  CT scan abdomen and pelvis from 01/04/2024. FINDINGS: Lower chest: There are subpleural atelectatic changes in the visualized lung bases. No overt consolidation. No pleural effusion. The heart is normal in size. No pericardial effusion. Hepatobiliary: The liver is normal in size. Non-cirrhotic configuration. No suspicious mass. No intrahepatic or extrahepatic bile duct dilation. There are new hyperattenuating areas in the dependent portion of the  gallbladder, which may represent vicarious excretion of previously administered intravenous contrast. Normal gallbladder wall thickness. No pericholecystic inflammatory changes. Pancreas: Unremarkable. No pancreatic ductal dilatation or surrounding inflammatory changes. Spleen: Within normal limits. No focal lesion. Adrenals/Urinary Tract: Adrenal glands are unremarkable. No suspicious renal mass. No hydronephrosis. No renal or ureteric calculi. Urinary bladder is decompressed secondary to Foley catheter, which appears in satisfactory position Stomach/Bowel: No disproportionate dilation of the small or large bowel loops. Appendix is not distinctly visualized. Vascular/Lymphatic: Redemonstration of fluid collection in the dependent pelvis currently measuring up to 7.3 x 9.6 cm, increased since the prior study. The collection exhibits thin hyperattenuating incomplete walls. Findings again may represent postoperative seroma versus developing abscess. There is also redemonstration of fluid and small amount of nondependent air in the space of Retzius, surrounding the urinary bladder as well as along the bilateral pelvic sidewalls, which is likely secondary to false passage/malposition of the Foley bulb seen on the prior exam. No abdominal or pelvic lymphadenopathy, by size criteria. No aneurysmal dilation of the major abdominal arteries. There are moderate peripheral atherosclerotic vascular calcifications of the aorta and its major branches. Reproductive: There is history of prostate resection. Other: Supraumbilical midline surgical scar noted. Musculoskeletal: No suspicious osseous lesions. There are mild multilevel degenerative changes in the visualized spine. IMPRESSION: 1. Redemonstration of fluid collection in the dependent pelvis, which has increased since the prior study. The collection exhibits thin hyperattenuating incomplete walls. Findings again may represent postoperative seroma versus developing abscess. 2.  There is also redemonstration of fluid and small amount of nondependent air in the Space of Retzius, surrounding the urinary bladder as well as along the bilateral pelvic sidewalls, which is likely secondary to false passage/malposition of the Foley bulb seen on the prior exam. 3. Multiple other nonacute observations, as described above.  Aortic Atherosclerosis (ICD10-I70.0). Electronically Signed   By: Ree Molt M.D.   On: 01/06/2024 15:03   MR Cervical Spine Wo Contrast Result Date: 01/04/2024 EXAM: MRI CERVICAL SPINE WITHOUT CONTRAST 01/04/2024 03:46:13 PM TECHNIQUE: Multiplanar multisequence MRI of the cervical spine was performed without the administration of intravenous contrast. COMPARISON: CT cervical spine dated 07/2021/24. CLINICAL HISTORY: Ataxia, nontraumatic, cervical pathology suspected. FINDINGS: BONES AND ALIGNMENT: Straightening of the normal cervical lordosis. Similar trace anterolisthesis of C7 on T1, likely related to facet arthrosis. No bone marrow edema or evidence of fracture. Vertebral body heights are maintained. SPINAL CORD: No cervical spinal cord signal abnormality. SOFT TISSUES: Paraspinal soft tissues are unremarkable. C2-C3: There is a disc osteophyte complex slightly eccentric to the left which indents the ventral thecal sac with mild flattening of the ventral cervical cord. Bilateral facet arthrosis, more pronounced on the left. There is severe foraminal stenosis on the left. C3-C4: There is a disc osteophyte complex and left paracentral disc protrusion which contacts and flattens the ventral cervical cord. Mild thickening of the ligamentum flavum. Mild-to-moderate spinal canal stenosis. Bilateral facet arthrosis resulting in mild bilateral foraminal stenosis. C4-C5: There is moderate disc desiccation and disc osteophyte complex at C4-C5, eccentric to the left which indents the ventral thecal sac with mild flattening of the ventral cervical cord. Mild-to-moderate spinal canal  stenosis. Bilateral facet arthrosis. There is moderate-to-severe right and mild left foraminal stenosis. C5-C6: There is a disc osteophyte complex and right central disc protrusion which indents the ventral thecal sac which also contacts the ventral cervical cord. Thickening of the ligaments. Moderate spinal canal stenosis. Facet arthrosis and uncovertebral hypertrophy with moderate right and mild left foraminal stenosis. C6-C7: There is a disc osteophyte complex and right paracentral disc protrusion which indents the thecal sac without contacting the spinal cord. Bilateral facet arthrosis and a preligamentous osteophyte complex, more pronounced on the right. There is moderate right and mild left foraminal stenosis. C7-T1: There is a diffuse disc bulge which indents the thecal sac without contact with the spinal cord. Bilateral severe facet arthrosis. There is moderate bilateral foraminal stenosis. IMPRESSION: 1. Degenerative changes in the cervical spine as detailed above. 2. Multilevel foraminal stenosis, greatest and severe on the left at C2-3, similar to prior. 3. Additional moderate-to-severe foraminal stenosis on the right at C4-5 and moderate foraminal stenosis on the right at C5-6 and C6-7 and bilaterally at C7-T1. 4. Similar moderate spinal canal stenosis at C5-6 and C7-T1. Electronically signed by: Donnice Mania MD 01/04/2024 04:54 PM EDT RP Workstation: [id number]   MR Brain W and Wo Contrast Result Date: 01/04/2024 EXAM: MRI BRAIN WITH AND WITHOUT CONTRAST 01/04/2024 03:45:23 PM TECHNIQUE: Multiplanar multisequence MRI of the head/brain was performed with and without the administration of intravenous contrast. COMPARISON: CT head and CTA head and neck earlier the same day. MRI head dated 11/26/2015. CLINICAL HISTORY: Metastatic disease evaluation. FINDINGS: BRAIN AND VENTRICLES: Scattered areas of T2/FLAIR hyperintensity in the supratentorial white matter, predominantly within the subcortical white  matter, which is mildly increased compared to the MRI from 2017. No abnormal enhancement of the brain parenchyma. No acute infarct. No acute intracranial hemorrhage. No mass effect or midline shift. No hydrocephalus. The sella is unremarkable. Normal flow voids. No mass. ORBITS: No acute abnormality. SINUSES: No acute abnormality. BONES AND SOFT TISSUES: Normal bone marrow signal and enhancement. No acute soft tissue abnormality. IMPRESSION: 1. No acute intracranial abnormality. 2. Scattered areas of signal abnormality primarily in the subcortical white matter, mildly increased compared  to the MRI from 2017. Findings likely reflect chronic microvascular ischemic changes versus other vasculopathies. 3. No abnormal enhancement. Electronically signed by: Donnice Mania MD 01/04/2024 04:04 PM EDT RP Workstation: [id number]   CT C-SPINE NO CHARGE Result Date: 01/04/2024 CLINICAL DATA:  Aphasia EXAM: CT CERVICAL SPINE WITHOUT CONTRAST TECHNIQUE: Multidetector CT imaging of the cervical spine was performed without intravenous contrast. Multiplanar CT image reconstructions were also generated. RADIATION DOSE REDUCTION: This exam was performed according to the departmental dose-optimization program which includes automated exposure control, adjustment of the mA and/or kV according to patient size and/or use of iterative reconstruction technique. COMPARISON:  None Available. FINDINGS: Alignment: Reversal of the normal cervical lordosis, likely degenerative in nature. 2 mm anterolisthesis C7-T1, degenerative in nature. Skull base and vertebrae: Radius cervical alignment is normal. The atlantodental interval is not widened. No acute fracture of the cervical spine. Vertebral body height is preserved. Soft tissues and spinal canal: No prevertebral fluid or swelling. No visible canal hematoma. Findings are correlated concurrently performed CT arteriogram the neck. 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 Disc levels: There is diffuse intervertebral disc space narrowing and endplate remodeling throughout the cervical spine in keeping with changes of diffuse severe degenerative disc disease. Prevertebral soft tissues are not thickened on sagittal reformats. Multilevel uncovertebral and facet arthrosis results in multilevel moderate to severe neuroforaminal narrowing, most severe on the right at C4-5, C5-6, and C6-7 on the left at C2-3 and bilaterally C7-T1. Upper chest: Negative. Other: None IMPRESSION: 1. No acute fracture or listhesis of the cervical spine. 2. 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 This would be better assessed with MRI examination. 3. Multilevel uncovertebral and facet arthrosis results in multilevel moderate to severe neuroforaminal narrowing, most severe on the right at C4-5, C5-6, and C6-7 on the left at C2-3 and bilaterally C7-T1. Electronically Signed   By: Dorethia Molt M.D.   On: 01/04/2024 12:10   CT ABDOMEN PELVIS W CONTRAST Result Date: 01/04/2024 CLINICAL DATA:  Abdominal pain, acute, nonlocalized h/o prostate ca resection last week, abd pain. * Tracking Code: BO * EXAM: CT ABDOMEN AND PELVIS WITH CONTRAST TECHNIQUE: Multidetector CT imaging of the abdomen and pelvis was performed using the standard protocol following bolus administration of intravenous contrast. RADIATION DOSE REDUCTION: This exam  was performed according to the departmental dose-optimization program which includes automated exposure control, adjustment of the mA and/or kV according to patient size and/or use of iterative reconstruction technique. CONTRAST:  75mL OMNIPAQUE  IOHEXOL  350 MG/ML SOLN COMPARISON:  CT scan abdomen and pelvis from 12/29/2023. FINDINGS: Lower chest: The lung bases are clear. No pleural effusion. The heart is normal in size. No pericardial effusion. Hepatobiliary: The liver is normal in size. Non-cirrhotic configuration. No suspicious mass. No intrahepatic or extrahepatic bile duct dilation. No calcified gallstones. Normal gallbladder wall thickness. No pericholecystic inflammatory changes. Pancreas: Unremarkable. No pancreatic ductal dilatation or surrounding inflammatory changes. Spleen: Within normal limits. No focal  lesion. Adrenals/Urinary Tract: Adrenal glands are unremarkable. No suspicious renal mass. There is a 6 x 12 mm cortical cyst in the right kidney lower pole. No nephroureterolithiasis or obstructive uropathy. Urinary bladder is partially distended despite a Foley catheter. The balloon of the Foley catheter appears inferior to the urinary bladder and is on the left paramedian side. Correlate clinically and urine out for optimal positioning. Stomach/Bowel: No disproportionate dilation of the small or large bowel loops. There is mild circumferential thickening of several distal small bowel loops in the right mid/lower abdomen, which are nonspecific but can be seen with focal enteritis. Vascular/Lymphatic: There is an approximately 4.2 x 7.0 cm sized fluid in the dependent pelvis, with thin hyperattenuating walls. Findings may represent postoperative seroma versus abscess. Correlate clinically. Small amount of free intraperitoneal air noted along the lower midline anterior abdominal wall and along the left inguinal canal, likely from recent surgery. There is additional trace ascites mainly in the perisplenic  region, significantly decreased since the prior study. No abdominal or pelvic lymphadenopathy, by size criteria. No aneurysmal dilation of the major abdominal arteries. There are moderate peripheral atherosclerotic vascular calcifications of the aorta and its major branches. Reproductive: There is provided history of recent prostate resection. Other: Supraumbilical midline surgical scar noted. The soft tissues and abdominal wall are otherwise unremarkable. Musculoskeletal: No suspicious osseous lesions. There are mild multilevel degenerative changes in the visualized spine. IMPRESSION: 1. Recent prostatectomy. There is an approximately 4.2 x 7.0 cm sized fluid collection in the dependent pelvis with thin hyperattenuating walls. Findings may represent postoperative seroma versus abscess. Correlate clinically. 2. There is mild circumferential thickening of several distal small bowel loops in the right mid/lower abdomen, which are nonspecific but can be seen with focal enteritis. 3. There is a Foley catheter in-situ however, the balloon appears inferior to the urinary bladder and on the left paramedian side. Correlate clinically with urine out to determine positioning. 4. Multiple other nonacute observations, as described above. Aortic Atherosclerosis (ICD10-I70.0). Electronically Signed   By: Ree Molt M.D.   On: 01/04/2024 10:59   CT ANGIO HEAD NECK W WO CM (CODE STROKE) Result Date: 01/04/2024 EXAM: CTA Head and Neck with Intravenous Contrast. CT Head without Contrast. CLINICAL HISTORY: Stroke, follow up. Aphasia. Code stroke. Dr. Arora. TECHNIQUE: Axial CTA images of the head and neck performed with intravenous contrast. MIP reconstructed images were created and reviewed. Axial computed tomography images of the head/brain performed without intravenous contrast. Note: Per PQRS, the description of internal carotid artery percent stenosis, including 0 percent or normal exam, is based on [place]  Symptomatic Carotid Endarterectomy Trial (NASCET) criteria. Dose reduction technique was used including one or more of the following: automated exposure control, adjustment of mA and kV according to patient size, and/or iterative reconstruction. CONTRAST: 75 mL of iohexol  (OMNIPAQUE ) 350 MG/ML injection. COMPARISON: Head CT 01/04/2024 and brain MRI 11/26/2015. FINDINGS: CT HEAD: BRAIN: No acute intraparenchymal hemorrhage. No mass lesion. No CT evidence for acute territorial infarct. No midline shift or extra-axial collection. Patchy supratentorial white matter hypoattenuation is unchanged compared to the earlier head CT and compatible with the findings of the MRI from 11/26/2015. VENTRICLES: No hydrocephalus. ORBITS: The orbits are unremarkable. SINUSES AND MASTOIDS: The paranasal sinuses and mastoid air cells are clear. CTA NECK: COMMON CAROTID ARTERIES: No significant stenosis. No dissection or occlusion. INTERNAL CAROTID ARTERIES: No stenosis by NASCET criteria. No dissection or occlusion. VERTEBRAL ARTERIES: Left vertebral artery arises independently from the aortic arch, a normal  variant. No significant stenosis. No dissection or occlusion. CTA HEAD: ANTERIOR CEREBRAL ARTERIES: No significant stenosis. No occlusion. No aneurysm. MIDDLE CEREBRAL ARTERIES: No significant stenosis. No occlusion. No aneurysm. POSTERIOR CEREBRAL ARTERIES: No significant stenosis. No occlusion. No aneurysm. BASILAR ARTERY: No significant stenosis. No occlusion. No aneurysm. OTHER: Calcific aortic atherosclerosis. SOFT TISSUES: No acute finding. No masses or lymphadenopathy. BONES: No acute osseous abnormality. IMPRESSION: 1. No acute intracranial hemorrhage 2. No evidence of significant stenosis, aneurysmal dilatation, or dissection involving the arteries of the head and neck. 3. Unchanged patchy supratentorial white matter hypoattenuation, compatible with prior MRI findings from 11/26/15. Electronically signed by: Franky Stanford MD  01/04/2024 10:25 AM EDT RP Workstation: [id number]   CT HEAD CODE STROKE WO CONTRAST Result Date: 01/04/2024 EXAM: CT HEAD WITHOUT 01/04/2024 09:55:51 AM TECHNIQUE: CT of the head was performed without the administration of intravenous contrast. Automated exposure control, iterative reconstruction, and/or weight based adjustment of the mA/kV was utilized to reduce the radiation dose to as low as reasonably achievable. COMPARISON: MRI head dated 02/03/2026. CLINICAL HISTORY: Neuro deficit, acute, stroke suspected. Aphasia, code stroke, dr deedra. FINDINGS: BRAIN AND VENTRICLES: No acute intracranial hemorrhage. No mass effect or midline shift. No extra-axial fluid collection. Gray-white differentiation is maintained. No hydrocephalus. Multiple scattered areas of hypoattenuation of the subcortical white matter corresponding to findings on prior MRI. ORBITS: No acute abnormality. SINUSES AND MASTOIDS: No acute abnormality. SOFT TISSUES AND SKULL: No acute skull fracture. No acute soft tissue abnormality. IMPRESSION: 1. No acute intracranial abnormality. ASPECTS is 10. 2. Multiple scattered areas of hypoattenuation in the subcortical white matter, corresponding to findings on prior MRI. 3. Findings messaged to Dr. Arora via the [facility] messaging system at 10:03 AM on 01/04/24. Electronically signed by: Donnice Mania MD 01/04/2024 10:05 AM EDT RP Workstation: [id number]   CT ABDOMEN PELVIS WO CONTRAST Result Date: 12/29/2023 CLINICAL DATA:  Abdominal pain and history of prostatectomy 3 days ago, initial encounter EXAM: CT ABDOMEN AND PELVIS WITHOUT CONTRAST TECHNIQUE: Multidetector CT imaging of the abdomen and pelvis was performed following the standard protocol without IV contrast. RADIATION DOSE REDUCTION: This exam was performed according to the departmental dose-optimization program which includes automated exposure control, adjustment of the mA and/or kV according to patient size and/or use of iterative  reconstruction technique. COMPARISON:  03/21/2023 FINDINGS: Lower chest: Lung bases are free of acute infiltrate or sizable effusion. Hepatobiliary: Liver and gallbladder appear within normal limits. Considerable perihepatic fluid is identified. Pancreas: Unremarkable. No pancreatic ductal dilatation or surrounding inflammatory changes. Spleen: Normal in size without focal abnormality. Adrenals/Urinary Tract: Adrenal glands are within normal limits. Kidneys demonstrate no renal calculi or obstructive changes. The bladder is decompressed by Foley catheter. Stomach/Bowel: No obstructive or inflammatory changes of the colon are seen. Scattered air is noted throughout the colon and small bowel likely related to a mild postoperative ileus. No obstructive changes are seen. The appendix is not well visualized although no inflammatory changes to suggest appendicitis are seen. Stomach is distended with fluid. Again some gaseous distension of the small bowel is noted likely related to small bowel ileus. Vascular/Lymphatic: Aortic atherosclerosis. No enlarged abdominal or pelvic lymph nodes. Reproductive: Prostate has been surgically removed. Other: Postsurgical changes are noted in the operative bed. Free fluid is noted within the pelvis and extending into the abdomen. This may simply be related to the recent surgery. Some free air is noted consistent with the laparoscopic technique. Air is also noted within the abdominal wall and scrotum again consistent  with the recent surgery. Musculoskeletal: No acute or significant osseous findings. IMPRESSION: Free fluid and free air is noted likely related to the recent surgery. Expected postsurgical changes in the operative bed. No sizable hematoma is noted. Some gaseous distension of the colon and small bowel is seen likely representing a postoperative ileus. No true obstructive changes are seen. Electronically Signed   By: Oneil Devonshire M.D.   On: 12/29/2023 02:05     Labs:  CBC: Recent Labs    12/28/23 2344 01/04/24 0952 01/04/24 0953 01/06/24 1215 01/07/24 0300  WBC 16.6* 7.3  --  18.0* 16.9*  HGB 10.9* 9.3* 10.2* 7.9* 8.1*  HCT 33.2* 29.0* 30.0* 24.6* 25.7*  PLT 339 472*  --  474* 466*    COAGS: Recent Labs    01/04/24 0952 01/07/24 0300  INR 1.1 1.2  APTT 25 38*    BMP: Recent Labs    12/29/23 1245 01/04/24 0952 01/04/24 0953 01/06/24 1215 01/07/24 0300  NA 135 134* 136 137 136  K 3.8 3.4* 3.4* 3.7 4.0  CL 94* 101 99 101 101  CO2 29 23  --  24 24  GLUCOSE 117* 140* 137* 90 87  BUN 31* 12 12 27* 22  CALCIUM  8.9 8.5*  --  9.0 8.6*  CREATININE 2.30* 1.54* 1.50* 1.67* 1.38*  GFRNONAA 31* 51*  --  46* 58*    LIVER FUNCTION TESTS: Recent Labs    12/28/23 2344 01/04/24 0952 01/06/24 1546 01/07/24 0300  BILITOT 0.8 0.9 0.8 1.2  AST 33 15 19 21   ALT 16 11 13 12   ALKPHOS 63 48 49 52  PROT 8.7* 6.4* 6.4* 7.1  ALBUMIN  4.2 3.1* 2.7* 2.9*    TUMOR MARKERS: No results for input(s): AFPTM, CEA, CA199, CHROMGRNA in the last 8760 hours.  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 He was discharged on POD1, but returned to ED on 7:30, found with dislodge Foley catheter and urinary retention. Catheter was advanced with spontaneous drainage of 1L of urine. Over the next week, he reported good urine output. Unfortunately, he noted decreased urine output on 8/6 as well as lower abdominal pain with a syncopal episode at that time. ED neuro workup was negative, but CT abdomen pelvis was notable for fluid collection in the dependent pelvis measuring 4 x 7 cm. Dr. Selma performed bedside cystoscopy, noting some suspicion for compromised vesicourethral anastomosis.  Foley catheter was replaced and he was discharged home. However, blood cultures obtained on 8/6 resulted positive for  E. coli and thus patient was called to return to the ED for IV antibiotics. Repeat CT A/P 01/06/2024 reveals redemonstration of pelvic fluid collection has increased in size now measuring 7 x 9 cm.    Patient presents for scheduled pelvic fluid collection drainage in IR today.  Patient has been NPO since midnight.  All labs and medications are within acceptable parameters.  No pertinent allergies.   Risks and benefits discussed with the patient including bleeding, infection, damage to adjacent structures, bowel perforation/fistula connection, and sepsis.  All of the patient's questions were answered, patient is agreeable to proceed. Consent signed and in chart.      Thank you for allowing our service to participate in Tequan Redmon 's care.  Electronically Signed: Carlin DELENA Griffon, PA-C   01/07/2024, 9:22 AM      I spent a total of 40 Minutes in  face to face in clinical consultation, greater than 50% of which was counseling/coordinating care for pelvic fluid collection, with consideration for drainage.

## 2024-01-07 NOTE — Progress Notes (Signed)
 Pharmacy Antibiotic Note  [name] is a 63 y.o. male for which pharmacy has been consulted for vancomycin  dosing for bacteremia and concern for abscess formation on CT imaging s/p a robotic prostatectomy on 12/25/23.  Patient with positive blood cultures w/ E coli (no resistance detected on BCID) and sensitivities are pending.  SCr 1.67 >> 1.38, was 5.25 on 7/30 WBC 18> 16.9, AF  Plan: Continue Ceftriaxone  2 gm IV q24 Vancomycin  1500 mg x 1 given 8/8 @ 1823 then vancomycin  1250 mg IV q24 for eAUC 512.3; SCr 1.38. Vd 0.73, Css min 12.1 Monitor WBC, fever, renal function, cultures Consider ID c/s for recs  Height: 5' 8 (172.7 cm) Weight: 69.2 kg (152 lb 8.9 oz) IBW/kg (Calculated) : 68.4  Temp (24hrs), Avg:98.8 F (37.1 C), Min:97.7 F (36.5 C), Max:100 F (37.8 C)  Recent Labs  Lab 01/04/24 0952 01/04/24 0953 01/04/24 1034 01/04/24 1309 01/06/24 1215 01/06/24 1544 01/06/24 1849 01/07/24 0300  WBC 7.3  --   --   --  18.0*  --   --  16.9*  CREATININE 1.54* 1.50*  --   --  1.67*  --   --  1.38*  LATICACIDVEN  --   --  1.8 1.6  --  0.9 0.9  --     Estimated Creatinine Clearance: 53.7 mL/min (A) (by C-G formula based on SCr of 1.38 mg/dL (H)).    Allergies  Allergen Reactions   Lisinopril Swelling    lips swelling   Microbiology results: 8/8 UCx cath: 8/8 BCx2:  8/6 BCx 1/4 bottles E coli, no R on BCID HIV NR  Thank you for allowing pharmacy to be a part of this patient's care.  Rosaline IVAR Edison, Pharm.D Use secure chat for questions 01/07/2024 7:37 AM

## 2024-01-07 NOTE — Progress Notes (Signed)
 PROGRESS NOTE    [name]  FMW:[id number] DOB: 08-21-1960 DOA: 01/06/2024 PCP: Delbert Clam, MD   Brief Narrative:  [name] is a 63 y.o. year old patient with prostate cancer who underwent RALP with b/l PLND on 12/26/2023 without complication and discharged on 12/27/2023.  Patient presents to our facility 01/06/2024 after previous blood cultures reported positive for E. coli.  Prior hospital course: after previous procedure patient had notable pain at home with worsening Foley drainage, nausea, vomiting. He was evaluated 7/30 by urology who replaced Foley which appears to have been poorly positioned after discharge.  1 week later on 8/6 patient had syncopal event at home with abdominal pain found to be hypotensive/hypovolemic in the ED but improved with bolus.  CT during this evaluation noted fluid collection in the pelvis which appears to have increased in size since prior hospitalization.  Assessment & Plan:   Principal Problem:   Sepsis secondary to UTI (HCC)  E. coli bacteremia Sepsis, POA -Source likely urogenital given recent surgery evaluation and Foley being previously dislodged - Continue ceftriaxone , supportive care  Prostate cancer status post robotic assisted laparoscopic radical prostatectomy with bilateral lymph node dissection 12/06/2023 -Urology following, noted [id number] GG2 disease  Pelvic fluid collection -Unspecified, concern for possible urine leak, ascites versus underlying infection - Fluid drainage/tube placement today per radiology -culture pending  AKI -continue IV fluids, advance diet as tolerated postprocedure HTN - well controlled - hold home meds to avoid hypotension CAD -  HLD - hold Evolucomab while in hospital, continue statin   DVT prophylaxis: SCDs Start: 01/06/24 1546   Code Status:   Code Status: Full Code  Family Communication: Wife at bedside  Status is: Inpatient  Dispo: The patient is from: Home               Anticipated d/c is to: Home              Anticipated d/c date is: 24 to 48 hours              Patient currently not medically stable for discharge  Consultants:  Urology, interventional radiology  Procedures:  Pelvic drain placement  Antimicrobials:  Ceftriaxone   Subjective: No acute issues or events overnight denies nausea vomit diarrhea constipation any fever chills chest pain  Objective: Vitals:   01/07/24 0300 01/07/24 0400 01/07/24 0500 01/07/24 0700  BP:  (!) 153/81 (!) 150/61 (!) 148/73  Pulse: 85 78 78 80  Resp: 19 20 19 18   Temp:      TempSrc:      SpO2: 100% 100% 100% 99%  Weight:      Height:        Intake/Output Summary (Last 24 hours) at 01/07/2024 0709 Last data filed at 01/07/2024 0630 Gross per 24 hour  Intake 2299.9 ml  Output 1050 ml  Net 1249.9 ml   Filed Weights   01/06/24 1044  Weight: 69.2 kg    Examination:  General exam: Appears calm and comfortable  Respiratory system: Clear to auscultation. Respiratory effort normal. Cardiovascular system: S1 & S2 heard, RRR. No JVD, murmurs, rubs, gallops or clicks. No pedal edema. Gastrointestinal system: Abdomen is nondistended, soft and nontender. No organomegaly or masses felt. Normal bowel sounds heard. Central nervous system: Alert and oriented. No focal neurological deficits. Extremities: Symmetric 5 x 5 power. Skin: No rashes, lesions or ulcers Psychiatry: Judgement and insight appear normal. Mood & affect appropriate.     Data Reviewed: I have personally reviewed following labs  and imaging studies  CBC: Recent Labs  Lab 01/04/24 0952 01/04/24 0953 01/06/24 1215 01/07/24 0300  WBC 7.3  --  18.0* 16.9*  NEUTROABS 5.2  --  16.7*  --   HGB 9.3* 10.2* 7.9* 8.1*  HCT 29.0* 30.0* 24.6* 25.7*  MCV 95.7  --  93.9 95.2  PLT 472*  --  474* 466*   Basic Metabolic Panel: Recent Labs  Lab 01/04/24 0952 01/04/24 0953 01/06/24 1215 01/07/24 0300  NA 134* 136 137 136  K 3.4* 3.4* 3.7 4.0   CL 101 99 101 101  CO2 23  --  24 24  GLUCOSE 140* 137* 90 87  BUN 12 12 27* 22  CREATININE 1.54* 1.50* 1.67* 1.38*  CALCIUM  8.5*  --  9.0 8.6*   GFR: Estimated Creatinine Clearance: 53.7 mL/min (A) (by C-G formula based on SCr of 1.38 mg/dL (H)). Liver Function Tests: Recent Labs  Lab 01/04/24 0952 01/06/24 1546 01/07/24 0300  AST 15 19 21   ALT 11 13 12   ALKPHOS 48 49 52  BILITOT 0.9 0.8 1.2  PROT 6.4* 6.4* 7.1  ALBUMIN  3.1* 2.7* 2.9*   Recent Labs  Lab 01/04/24 0953  LIPASE 28   No results for input(s): AMMONIA in the last 168 hours. Coagulation Profile: Recent Labs  Lab 01/04/24 0952 01/07/24 0300  INR 1.1 1.2   Cardiac Enzymes: No results for input(s): CKTOTAL, CKMB, CKMBINDEX, TROPONINI in the last 168 hours. BNP (last 3 results) No results for input(s): PROBNP in the last 8760 hours. HbA1C: No results for input(s): HGBA1C in the last 72 hours. CBG: Recent Labs  Lab 01/04/24 0948  GLUCAP 144*   Lipid Profile: No results for input(s): CHOL, HDL, LDLCALC, TRIG, CHOLHDL, LDLDIRECT in the last 72 hours. Thyroid  Function Tests: No results for input(s): TSH, T4TOTAL, FREET4, T3FREE, THYROIDAB in the last 72 hours. Anemia Panel: No results for input(s): VITAMINB12, FOLATE, FERRITIN, TIBC, IRON, RETICCTPCT in the last 72 hours. Sepsis Labs: Recent Labs  Lab 01/04/24 1034 01/04/24 1309 01/06/24 1544 01/06/24 1849  LATICACIDVEN 1.8 1.6 0.9 0.9    Recent Results (from the past 240 hours)  Blood culture (routine x 2)     Status: None (Preliminary result)   Collection Time: 01/04/24 12:42 PM   Specimen: BLOOD  Result Value Ref Range Status   Specimen Description BLOOD RIGHT ANTECUBITAL  Final   Special Requests   Final    BOTTLES DRAWN AEROBIC AND ANAEROBIC Blood Culture adequate volume   Culture   Final    NO GROWTH 2 DAYS Performed at [facility], [address], [place], [place] [address]     Report Status PENDING  Incomplete  Blood culture (routine x 2)     Status: Abnormal (Preliminary result)   Collection Time: 01/04/24  1:00 PM   Specimen: BLOOD RIGHT HAND  Result Value Ref Range Status   Specimen Description BLOOD RIGHT HAND  Final   Special Requests   Final    BOTTLES DRAWN AEROBIC AND ANAEROBIC Blood Culture adequate volume   Culture  Setup Time   Final    GRAM NEGATIVE RODS AEROBIC BOTTLE ONLY CRITICAL RESULT CALLED TO, READ BACK BY AND VERIFIED WITH: ED CHARGE RN OZELL CORDOBA [phone] AT 0959, ADC    Culture (A)  Final    ESCHERICHIA COLI SUSCEPTIBILITIES TO FOLLOW Performed at [facility], [address] [place], [place] [address]    Report Status PENDING  Incomplete  Blood Culture ID  Panel (Reflexed)     Status: Abnormal   Collection Time: 01/04/24  1:00 PM  Result Value Ref Range Status   Enterococcus faecalis NOT DETECTED NOT DETECTED Final   Enterococcus Faecium NOT DETECTED NOT DETECTED Final   Listeria monocytogenes NOT DETECTED NOT DETECTED Final   Staphylococcus species NOT DETECTED NOT DETECTED Final   Staphylococcus aureus (BCID) NOT DETECTED NOT DETECTED Final   Staphylococcus epidermidis NOT DETECTED NOT DETECTED Final   Staphylococcus lugdunensis NOT DETECTED NOT DETECTED Final   Streptococcus species NOT DETECTED NOT DETECTED Final   Streptococcus agalactiae NOT DETECTED NOT DETECTED Final   Streptococcus pneumoniae NOT DETECTED NOT DETECTED Final   Streptococcus pyogenes NOT DETECTED NOT DETECTED Final   A.calcoaceticus-baumannii NOT DETECTED NOT DETECTED Final   Bacteroides fragilis NOT DETECTED NOT DETECTED Final   Enterobacterales DETECTED (A) NOT DETECTED Final    Comment: Enterobacterales represent a large order of gram negative bacteria, not a single organism. CRITICAL RESULT CALLED TO, READ BACK BY AND VERIFIED WITH: ED CHARGE RN MICHAEL D. [phone] AT [phone], ADC    Enterobacter cloacae complex NOT DETECTED NOT DETECTED Final    Escherichia coli DETECTED (A) NOT DETECTED Final    Comment: CRITICAL RESULT CALLED TO, READ BACK BY AND VERIFIED WITH: ED CHARGE RN MICHAEL D. [phone] AT 0959, ADC    Klebsiella aerogenes NOT DETECTED NOT DETECTED Final   Klebsiella oxytoca NOT DETECTED NOT DETECTED Final   Klebsiella pneumoniae NOT DETECTED NOT DETECTED Final   Proteus species NOT DETECTED NOT DETECTED Final   Salmonella species NOT DETECTED NOT DETECTED Final   Serratia marcescens NOT DETECTED NOT DETECTED Final   Haemophilus influenzae NOT DETECTED NOT DETECTED Final   Neisseria meningitidis NOT DETECTED NOT DETECTED Final   Pseudomonas aeruginosa NOT DETECTED NOT DETECTED Final   Stenotrophomonas maltophilia NOT DETECTED NOT DETECTED Final   Candida albicans NOT DETECTED NOT DETECTED Final   Candida auris NOT DETECTED NOT DETECTED Final   Candida glabrata NOT DETECTED NOT DETECTED Final   Candida krusei NOT DETECTED NOT DETECTED Final   Candida parapsilosis NOT DETECTED NOT DETECTED Final   Candida tropicalis NOT DETECTED NOT DETECTED Final   Cryptococcus neoformans/gattii NOT DETECTED NOT DETECTED Final   CTX-M ESBL NOT DETECTED NOT DETECTED Final   Carbapenem resistance IMP NOT DETECTED NOT DETECTED Final   Carbapenem resistance KPC NOT DETECTED NOT DETECTED Final   Carbapenem resistance NDM NOT DETECTED NOT DETECTED Final   Carbapenem resist OXA 48 LIKE NOT DETECTED NOT DETECTED Final   Carbapenem resistance VIM NOT DETECTED NOT DETECTED Final    Comment: Performed at [facility], [address] [place], [place] [address]         Radiology Studies: CT ABDOMEN PELVIS W CONTRAST Result Date: 01/06/2024 CLINICAL DATA:  Abdominal pain, post-op. * Tracking Code: BO * EXAM: CT ABDOMEN AND PELVIS WITH CONTRAST TECHNIQUE: Multidetector CT imaging of the abdomen and pelvis was performed using the standard protocol following bolus administration of intravenous contrast. RADIATION DOSE REDUCTION: This exam was  performed according to the departmental dose-optimization program which includes automated exposure control, adjustment of the mA and/or kV according to patient size and/or use of iterative reconstruction technique. CONTRAST:  75mL OMNIPAQUE  IOHEXOL  350 MG/ML SOLN COMPARISON:  CT scan abdomen and pelvis from 01/04/2024. FINDINGS: Lower chest: There are subpleural atelectatic changes in the visualized lung bases. No overt consolidation. No pleural effusion. The heart is normal in size. No pericardial effusion. Hepatobiliary: The liver is  normal in size. Non-cirrhotic configuration. No suspicious mass. No intrahepatic or extrahepatic bile duct dilation. There are new hyperattenuating areas in the dependent portion of the gallbladder, which may represent vicarious excretion of previously administered intravenous contrast. Normal gallbladder wall thickness. No pericholecystic inflammatory changes. Pancreas: Unremarkable. No pancreatic ductal dilatation or surrounding inflammatory changes. Spleen: Within normal limits. No focal lesion. Adrenals/Urinary Tract: Adrenal glands are unremarkable. No suspicious renal mass. No hydronephrosis. No renal or ureteric calculi. Urinary bladder is decompressed secondary to Foley catheter, which appears in satisfactory position Stomach/Bowel: No disproportionate dilation of the small or large bowel loops. Appendix is not distinctly visualized. Vascular/Lymphatic: Redemonstration of fluid collection in the dependent pelvis currently measuring up to 7.3 x 9.6 cm, increased since the prior study. The collection exhibits thin hyperattenuating incomplete walls. Findings again may represent postoperative seroma versus developing abscess. There is also redemonstration of fluid and small amount of nondependent air in the space of Retzius, surrounding the urinary bladder as well as along the bilateral pelvic sidewalls, which is likely secondary to false passage/malposition of the Foley bulb  seen on the prior exam. No abdominal or pelvic lymphadenopathy, by size criteria. No aneurysmal dilation of the major abdominal arteries. There are moderate peripheral atherosclerotic vascular calcifications of the aorta and its major branches. Reproductive: There is history of prostate resection. Other: Supraumbilical midline surgical scar noted. Musculoskeletal: No suspicious osseous lesions. There are mild multilevel degenerative changes in the visualized spine. IMPRESSION: 1. Redemonstration of fluid collection in the dependent pelvis, which has increased since the prior study. The collection exhibits thin hyperattenuating incomplete walls. Findings again may represent postoperative seroma versus developing abscess. 2. There is also redemonstration of fluid and small amount of nondependent air in the Space of Retzius, surrounding the urinary bladder as well as along the bilateral pelvic sidewalls, which is likely secondary to false passage/malposition of the Foley bulb seen on the prior exam. 3. Multiple other nonacute observations, as described above. Aortic Atherosclerosis (ICD10-I70.0). Electronically Signed   By: Ree Molt M.D.   On: 01/06/2024 15:03   Scheduled Meds:  Chlorhexidine  Gluconate Cloth  6 each Topical Daily   DULoxetine   60 mg Oral Daily   feeding supplement  237 mL Oral BID BM   pantoprazole  (PROTONIX ) IV  40 mg Intravenous Q12H   rosuvastatin   40 mg Oral Daily   vancomycin  variable dose per unstable renal function (pharmacist dosing)   Does not apply See admin instructions   Continuous Infusions:  cefTRIAXone  (ROCEPHIN )  IV       LOS: 1 day   Time spent:  Elsie JAYSON Montclair, DO [facility]  If 7PM-7AM, please contact night-coverage [email or web address]  01/07/2024, 7:09 AM

## 2024-01-07 NOTE — Procedures (Signed)
 Interventional Radiology Procedure:   Indications: Prostatectomy with pelvic fluid collections  Procedure: CT guided drain placement  Findings: Contrast leaking into pelvis after administration of IV contrast.  Leak along left posterior aspect of bladder base.  10 Fr drain placed in pelvic collection from left transgluteal approach.   Complications: None     EBL: Minimal  Plan: Fluid sent for culture and creatinine  [name] Reine R. Philip, MD  Pager: [phone]

## 2024-01-08 ENCOUNTER — Inpatient Hospital Stay (HOSPITAL_COMMUNITY): Payer: Self-pay

## 2024-01-08 ENCOUNTER — Inpatient Hospital Stay (HOSPITAL_COMMUNITY): Payer: Self-pay | Admitting: Anesthesiology

## 2024-01-08 ENCOUNTER — Telehealth (HOSPITAL_BASED_OUTPATIENT_CLINIC_OR_DEPARTMENT_OTHER): Payer: Self-pay | Admitting: *Deleted

## 2024-01-08 ENCOUNTER — Encounter (HOSPITAL_COMMUNITY)
Admission: EM | Disposition: A | Discharge status: Home / Self Care | Payer: Self-pay | Source: Home / Self Care | Attending: Internal Medicine

## 2024-01-08 DIAGNOSIS — I252 Old myocardial infarction: Secondary | ICD-10-CM

## 2024-01-08 DIAGNOSIS — I25119 Atherosclerotic heart disease of native coronary artery with unspecified angina pectoris: Secondary | ICD-10-CM

## 2024-01-08 DIAGNOSIS — K660 Peritoneal adhesions (postprocedural) (postinfection): Secondary | ICD-10-CM

## 2024-01-08 DIAGNOSIS — Z87891 Personal history of nicotine dependence: Secondary | ICD-10-CM

## 2024-01-08 HISTORY — PX: ROBOT ASSISTED LAPAROSCOPIC RADICAL PROSTATECTOMY: SHX5141

## 2024-01-08 HISTORY — PX: CYSTOSCOPY W/ URETERAL STENT PLACEMENT: SHX1429

## 2024-01-08 LAB — CBC
HCT: 23.5 % — ABNORMAL LOW (ref 39.0–52.0)
Hemoglobin: 7.5 g/dL — ABNORMAL LOW (ref 13.0–17.0)
MCH: 30.1 pg (ref 26.0–34.0)
MCHC: 31.9 g/dL (ref 30.0–36.0)
MCV: 94.4 fL (ref 80.0–100.0)
Platelets: 481 K/uL — ABNORMAL HIGH (ref 150–400)
RBC: 2.49 MIL/uL — ABNORMAL LOW (ref 4.22–5.81)
RDW: 13.2 % (ref 11.5–15.5)
WBC: 12.5 K/uL — ABNORMAL HIGH (ref 4.0–10.5)
nRBC: 0 % (ref 0.0–0.2)

## 2024-01-08 LAB — BASIC METABOLIC PANEL WITH GFR
Anion gap: 13 (ref 5–15)
BUN: 24 mg/dL — ABNORMAL HIGH (ref 8–23)
CO2: 21 mmol/L — ABNORMAL LOW (ref 22–32)
Calcium: 8.6 mg/dL — ABNORMAL LOW (ref 8.9–10.3)
Chloride: 102 mmol/L (ref 98–111)
Creatinine, Ser: 1.29 mg/dL — ABNORMAL HIGH (ref 0.61–1.24)
GFR, Estimated: >60 mL/min (ref >60)
Glucose, Bld: 88 mg/dL (ref 70–99)
Potassium: 3.7 mmol/L (ref 3.5–5.1)
Sodium: 136 mmol/L (ref 135–145)

## 2024-01-08 LAB — TYPE AND SCREEN
ABO/RH(D): A NEG
Antibody Screen: NEGATIVE

## 2024-01-08 SURGERY — CYSTOSCOPY, WITH RETROGRADE PYELOGRAM AND URETERAL STENT INSERTION
Anesthesia: General | Site: Bladder

## 2024-01-08 MED ORDER — IOHEXOL 300 MG/ML  SOLN
INTRAMUSCULAR | Status: DC | PRN
  Administered 2024-01-08: 10 mL

## 2024-01-08 MED ORDER — ARTIFICIAL TEARS OPHTHALMIC OINT
TOPICAL_OINTMENT | OPHTHALMIC | Status: AC
Start: 2024-01-08 — End: 2024-01-08
  Filled 2024-01-08: qty 3.5

## 2024-01-08 MED ORDER — FENTANYL CITRATE (PF) 250 MCG/5ML IJ SOLN
INTRAMUSCULAR | Status: DC | PRN
  Administered 2024-01-08: 50 ug via INTRAVENOUS
  Administered 2024-01-08: 25 ug via INTRAVENOUS
  Administered 2024-01-08: 100 ug via INTRAVENOUS
  Administered 2024-01-08: 50 ug via INTRAVENOUS
  Administered 2024-01-08: 25 ug via INTRAVENOUS
  Administered 2024-01-08 (×2): 50 ug via INTRAVENOUS

## 2024-01-08 MED ORDER — FENTANYL CITRATE (PF) 100 MCG/2ML IJ SOLN
INTRAMUSCULAR | Status: AC
Start: 2024-01-08 — End: 2024-01-08
  Filled 2024-01-08: qty 2

## 2024-01-08 MED ORDER — LIDOCAINE HCL (PF) 2 % IJ SOLN
INTRAMUSCULAR | Status: DC | PRN
  Administered 2024-01-08: 60 mg via INTRADERMAL

## 2024-01-08 MED ORDER — AMISULPRIDE (ANTIEMETIC) 5 MG/2ML IV SOLN: 10.0000 mg | Freq: Once | INTRAVENOUS | Status: DC | PRN

## 2024-01-08 MED ORDER — ROCURONIUM BROMIDE 10 MG/ML (PF) SYRINGE
PREFILLED_SYRINGE | INTRAVENOUS | Status: AC
  Filled 2024-01-08: qty 20

## 2024-01-08 MED ORDER — OXYCODONE HCL 5 MG/5ML PO SOLN: 5.0000 mg | Freq: Once | ORAL | Status: DC | PRN

## 2024-01-08 MED ORDER — BUPIVACAINE-EPINEPHRINE (PF) 0.25% -1:200000 IJ SOLN
INTRAMUSCULAR | Status: AC
  Filled 2024-01-08: qty 30

## 2024-01-08 MED ORDER — STERILE WATER FOR IRRIGATION IR SOLN
Status: DC | PRN
  Administered 2024-01-08: 1000 mL

## 2024-01-08 MED ORDER — DEXAMETHASONE SODIUM PHOSPHATE 10 MG/ML IJ SOLN
INTRAMUSCULAR | Status: AC
  Filled 2024-01-08: qty 1

## 2024-01-08 MED ORDER — DEXAMETHASONE SODIUM PHOSPHATE 10 MG/ML IJ SOLN
INTRAMUSCULAR | Status: DC | PRN
  Administered 2024-01-08: 10 mg via INTRAVENOUS

## 2024-01-08 MED ORDER — FENTANYL CITRATE (PF) 250 MCG/5ML IJ SOLN
INTRAMUSCULAR | Status: AC
  Filled 2024-01-08: qty 5

## 2024-01-08 MED ORDER — OXYCODONE HCL 5 MG PO TABS: 5.0000 mg | ORAL_TABLET | Freq: Once | ORAL | Status: DC | PRN

## 2024-01-08 MED ORDER — FENTANYL CITRATE PF 50 MCG/ML IJ SOSY
PREFILLED_SYRINGE | INTRAMUSCULAR | Status: AC
  Filled 2024-01-08: qty 3

## 2024-01-08 MED ORDER — SUGAMMADEX SODIUM 200 MG/2ML IV SOLN
INTRAVENOUS | Status: DC | PRN
  Administered 2024-01-08: 200 mg via INTRAVENOUS

## 2024-01-08 MED ORDER — SODIUM CHLORIDE 0.9 % IV SOLN
INTRAVENOUS | Status: AC
  Filled 2024-01-08: qty 20

## 2024-01-08 MED ORDER — ROCURONIUM BROMIDE 10 MG/ML (PF) SYRINGE
PREFILLED_SYRINGE | INTRAVENOUS | Status: DC | PRN
  Administered 2024-01-08: 20 mg via INTRAVENOUS
  Administered 2024-01-08: 30 mg via INTRAVENOUS
  Administered 2024-01-08 (×2): 20 mg via INTRAVENOUS
  Administered 2024-01-08: 40 mg via INTRAVENOUS

## 2024-01-08 MED ORDER — PROPOFOL 10 MG/ML IV BOLUS
INTRAVENOUS | Status: DC | PRN
  Administered 2024-01-08: 200 mg via INTRAVENOUS

## 2024-01-08 MED ORDER — LIDOCAINE 2% (20 MG/ML) 5 ML SYRINGE: INTRAMUSCULAR | Status: DC | PRN

## 2024-01-08 MED ORDER — SUGAMMADEX SODIUM 200 MG/2ML IV SOLN
INTRAVENOUS | Status: AC
Start: 2024-01-08 — End: 2024-01-08
  Filled 2024-01-08: qty 2

## 2024-01-08 MED ORDER — ACETAMINOPHEN 10 MG/ML IV SOLN
INTRAVENOUS | Status: AC
Start: 2024-01-08 — End: 2024-01-08
  Filled 2024-01-08: qty 100

## 2024-01-08 MED ORDER — SODIUM CHLORIDE 0.9 % IR SOLN
Status: DC | PRN
  Administered 2024-01-08: 3000 mL via INTRAVESICAL

## 2024-01-08 MED ORDER — MIDAZOLAM HCL 2 MG/2ML IJ SOLN
INTRAMUSCULAR | Status: DC | PRN
  Administered 2024-01-08: 2 mg via INTRAVENOUS

## 2024-01-08 MED ORDER — FENTANYL CITRATE PF 50 MCG/ML IJ SOSY
25.0000 ug | PREFILLED_SYRINGE | INTRAMUSCULAR | Status: DC | PRN
  Administered 2024-01-08 (×3): 50 ug via INTRAVENOUS

## 2024-01-08 MED ORDER — BUPIVACAINE LIPOSOME 1.3 % IJ SUSP
INTRAMUSCULAR | Status: DC | PRN
  Administered 2024-01-08: 15 mL
  Administered 2024-01-08: 5 mL

## 2024-01-08 MED ORDER — MIDAZOLAM HCL 2 MG/2ML IJ SOLN
INTRAMUSCULAR | Status: AC
Start: 2024-01-08 — End: 2024-01-08
  Filled 2024-01-08: qty 2

## 2024-01-08 MED ORDER — ONDANSETRON HCL 4 MG/2ML IJ SOLN
INTRAMUSCULAR | Status: DC | PRN
  Administered 2024-01-08: 4 mg via INTRAVENOUS

## 2024-01-08 MED ORDER — BUPIVACAINE LIPOSOME 1.3 % IJ SUSP
INTRAMUSCULAR | Status: AC
  Filled 2024-01-08: qty 20

## 2024-01-08 MED ORDER — LACTATED RINGERS IV SOLN: INTRAVENOUS | Status: DC | PRN

## 2024-01-08 MED ORDER — ACETAMINOPHEN 10 MG/ML IV SOLN
INTRAVENOUS | Status: DC | PRN
Start: 2024-01-08 — End: 2024-01-08
  Administered 2024-01-08: 1000 mg via INTRAVENOUS

## 2024-01-08 MED ORDER — PHENYLEPHRINE HCL-NACL 20-0.9 MG/250ML-% IV SOLN
INTRAVENOUS | Status: DC | PRN
  Administered 2024-01-08: 20 ug/min via INTRAVENOUS

## 2024-01-08 MED ORDER — ACETAMINOPHEN 10 MG/ML IV SOLN: 1000.0000 mg | Freq: Once | INTRAVENOUS | Status: DC | PRN

## 2024-01-08 MED ORDER — PROPOFOL 10 MG/ML IV BOLUS
INTRAVENOUS | Status: AC
  Filled 2024-01-08: qty 20

## 2024-01-08 MED ORDER — MORPHINE SULFATE (PF) 2 MG/ML IV SOLN
1.0000 mg | INTRAVENOUS | Status: DC | PRN
  Administered 2024-01-08 – 2024-01-09 (×8): 1 mg via INTRAVENOUS
  Filled 2024-01-08 (×5): qty 1

## 2024-01-08 MED ORDER — LACTATED RINGERS IR SOLN
Status: DC | PRN
  Administered 2024-01-08: 1000 mL

## 2024-01-08 MED ORDER — ONDANSETRON HCL 4 MG/2ML IJ SOLN
INTRAMUSCULAR | Status: AC
Start: 2024-01-08 — End: 2024-01-08
  Filled 2024-01-08: qty 2

## 2024-01-08 SURGICAL SUPPLY — 77 items
APPLICATOR COTTON TIP 6 STRL (MISCELLANEOUS) ×2 IMPLANT
APPLICATOR SURGIFLO ENDO (HEMOSTASIS) IMPLANT
BAG COUNTER SPONGE SURGICOUNT (BAG) IMPLANT
BAG URO CATCHER STRL LF (MISCELLANEOUS) ×2 IMPLANT
CATH FOLEY 2WAY SLVR 18FR 30CC (CATHETERS) IMPLANT
CATH FOLEY 2WAY SLVR 5CC 18FR (CATHETERS) ×2 IMPLANT
CATH ROBINSON RED A/P 16FR (CATHETERS) ×2 IMPLANT
CATH SILICONE 5CC 18FR (INSTRUMENTS) ×2 IMPLANT
CATH URETL OPEN 5X70 (CATHETERS) IMPLANT
CHLORAPREP W/TINT 26 (MISCELLANEOUS) ×2 IMPLANT
CLIP LIGATING HEM O LOK PURPLE (MISCELLANEOUS) ×2 IMPLANT
CLOTH BEACON ORANGE TIMEOUT ST (SAFETY) ×2 IMPLANT
COVER SURGICAL LIGHT HANDLE (MISCELLANEOUS) ×2 IMPLANT
COVER TIP SHEARS 8 DVNC (MISCELLANEOUS) ×2 IMPLANT
CUTTER ECHEON FLEX ENDO 45 340 (ENDOMECHANICALS) IMPLANT
DERMABOND ADVANCED .7 DNX12 (GAUZE/BANDAGES/DRESSINGS) ×2 IMPLANT
DISSECTOR BLUNT TIP ENDO 5MM (MISCELLANEOUS) IMPLANT
DRAPE ARM DVNC X/XI (DISPOSABLE) ×8 IMPLANT
DRAPE COLUMN DVNC XI (DISPOSABLE) ×2 IMPLANT
DRAPE SURG IRRIG POUCH 19X23 (DRAPES) ×2 IMPLANT
DRAPE UTILITY XL STRL (DRAPES) IMPLANT
DRIVER NDL LRG 8 DVNC XI (INSTRUMENTS) ×4 IMPLANT
DRIVER NDLE LRG 8 DVNC XI (INSTRUMENTS) ×4 IMPLANT
DRSG TEGADERM 4X4.75 (GAUZE/BANDAGES/DRESSINGS) IMPLANT
ELECT PENCIL ROCKER SW 15FT (MISCELLANEOUS) ×2 IMPLANT
ELECT REM PT RETURN 15FT ADLT (MISCELLANEOUS) ×2 IMPLANT
FORCEPS BPLR 8 MD DVNC XI (FORCEP) ×2 IMPLANT
FORCEPS BPLR FENES DVNC XI (FORCEP) ×2 IMPLANT
FORCEPS PROGRASP DVNC XI (FORCEP) ×2 IMPLANT
GAUZE 4X4 16PLY ~~LOC~~+RFID DBL (SPONGE) IMPLANT
GAUZE SPONGE 2X2 8PLY STRL LF (GAUZE/BANDAGES/DRESSINGS) IMPLANT
GAUZE SPONGE 4X4 12PLY STRL (GAUZE/BANDAGES/DRESSINGS) ×2 IMPLANT
GLOVE BIOGEL M 7.0 STRL (GLOVE) ×4 IMPLANT
GLOVE BIOGEL PI IND STRL 7.5 (GLOVE) ×4 IMPLANT
GOWN STRL REUS W/ TWL LRG LVL3 (GOWN DISPOSABLE) ×2 IMPLANT
GOWN STRL REUS W/ TWL XL LVL3 (GOWN DISPOSABLE) ×4 IMPLANT
GOWN STRL SURGICAL XL XLNG (GOWN DISPOSABLE) ×2 IMPLANT
GUIDEWIRE STR DUAL SENSOR (WIRE) ×2 IMPLANT
GUIDEWIRE ZIPWRE .038 STRAIGHT (WIRE) IMPLANT
HOLDER FOLEY CATH W/STRAP (MISCELLANEOUS) ×2 IMPLANT
IRRIGATION SUCT STRKRFLW 2 WTP (MISCELLANEOUS) ×2 IMPLANT
IV LACTATED RINGERS 1000ML (IV SOLUTION) ×2 IMPLANT
KIT TURNOVER KIT A (KITS) ×2 IMPLANT
MANIFOLD NEPTUNE II (INSTRUMENTS) ×2 IMPLANT
MARKER SKIN DUAL TIP RULER LAB (MISCELLANEOUS) ×2 IMPLANT
NDL INSUFFLATION 14GA 120MM (NEEDLE) ×2 IMPLANT
NEEDLE INSUFFLATION 14GA 120MM (NEEDLE) ×2 IMPLANT
PACK CYSTO (CUSTOM PROCEDURE TRAY) ×2 IMPLANT
PACK ROBOT UROLOGY CUSTOM (CUSTOM PROCEDURE TRAY) ×2 IMPLANT
PLUG CATH AND CAP STRL 200 (CATHETERS) IMPLANT
PROTECTOR NERVE ULNAR (MISCELLANEOUS) ×2 IMPLANT
RELOAD STAPLE 45 4.1 GRN THCK (STAPLE) IMPLANT
SCISSORS LAP 5X35 DISP (ENDOMECHANICALS) IMPLANT
SCISSORS MNPLR CVD DVNC XI (INSTRUMENTS) ×2 IMPLANT
SEAL UNIV 5-12 XI (MISCELLANEOUS) ×8 IMPLANT
SET TUBE SMOKE EVAC HIGH FLOW (TUBING) ×2 IMPLANT
SHEATH DILATOR SET 8/10 (MISCELLANEOUS) IMPLANT
SOL PREP POV-IOD 4OZ 10% (MISCELLANEOUS) ×2 IMPLANT
SOLUTION ELECTROSURG ANTI STCK (MISCELLANEOUS) ×2 IMPLANT
STENT URET 6FRX26 CONTOUR (STENTS) IMPLANT
SURGIFLO W/THROMBIN 8M KIT (HEMOSTASIS) IMPLANT
SUT ETHILON 2 0 PS N (SUTURE) IMPLANT
SUT ETHILON 3 0 PS 1 (SUTURE) IMPLANT
SUT MNCRL 3 0 VIOLET RB1 (SUTURE) IMPLANT
SUT MNCRL AB 4-0 PS2 18 (SUTURE) ×4 IMPLANT
SUT PDS AB 0 CT1 36 (SUTURE) ×4 IMPLANT
SUT VIC AB 0 CT1 27XBRD ANTBC (SUTURE) ×2 IMPLANT
SUT VIC AB 2-0 SH 27XBRD (SUTURE) ×2 IMPLANT
SUT VIC AB 3-0 SH 27X BRD (SUTURE) IMPLANT
SUT VIC AB 4-0 RB1 27XBRD (SUTURE) IMPLANT
SUT VLOC 3-0 9IN GRN (SUTURE) IMPLANT
SUTURE VLOC BRB 180 ABS3/0GR12 (SUTURE) ×4 IMPLANT
SYR 10ML LL (SYRINGE) ×2 IMPLANT
TROCAR BALLN 12MMX100 BLUNT (TROCAR) IMPLANT
TROCAR Z THREAD OPTICAL 12X100 (TROCAR) IMPLANT
TUBING CONNECTING 10 (TUBING) ×2 IMPLANT
WATER STERILE IRR 1000ML POUR (IV SOLUTION) ×2 IMPLANT

## 2024-01-08 NOTE — Anesthesia Preprocedure Evaluation (Addendum)
 Anesthesia Evaluation  Patient identified by MRN, date of birth, ID band Patient awake    Reviewed: Allergy & Precautions, NPO status , Patient's Chart, lab work & pertinent test results  Airway Mallampati: III  TM Distance: >3 FB Neck ROM: Full    Dental  (+) Chipped,    Pulmonary former smoker   Pulmonary exam normal        Cardiovascular hypertension, Pt. on medications + CAD, + Past MI, + Cardiac Stents and + Peripheral Vascular Disease  Normal cardiovascular exam     Neuro/Psych  Headaches PSYCHIATRIC DISORDERS  Depression    CVA    GI/Hepatic negative GI ROS, Neg liver ROS,,,  Endo/Other  negative endocrine ROS    Renal/GU Renal InsufficiencyRenal disease     Musculoskeletal  (+) Arthritis ,    Abdominal   Peds  Hematology  (+) Blood dyscrasia, anemia   Anesthesia Other Findings s/p prostatectomy, possible ureter leak, bacteremia  Reproductive/Obstetrics                              Anesthesia Physical Anesthesia Plan  ASA: 3 and emergent  Anesthesia Plan: General   Post-op Pain Management:    Induction: Intravenous  PONV Risk Score and Plan: 2 and Ondansetron , Dexamethasone , Midazolam  and Treatment may vary due to age or medical condition  Airway Management Planned: Oral ETT  Additional Equipment:   Intra-op Plan:   Post-operative Plan: Extubation in OR  Informed Consent: I have reviewed the patients History and Physical, chart, labs and discussed the procedure including the risks, benefits and alternatives for the proposed anesthesia with the patient or authorized representative who has indicated his/her understanding and acceptance.     Dental advisory given  Plan Discussed with: CRNA  Anesthesia Plan Comments: (Potential arterial line placement discussed )         Anesthesia Quick Evaluation

## 2024-01-08 NOTE — Telephone Encounter (Signed)
 Post ED Visit - Positive Culture Follow-up  Culture report reviewed by antimicrobial stewardship pharmacist: [facility] []  Rankin Dee, Pharm.D. []  Venetia Gully, Pharm.D., BCPS AQ-ID []  Garrel Crews, Pharm.D., BCPS []  Almarie Lunger, Pharm.D., BCPS []  [place], [address].D., BCPS, AAHIVP []  Rosaline Bihari, Pharm.D., BCPS, AAHIVP []  Vernell Meier, PharmD, BCPS []  Latanya Hint, PharmD, BCPS []  Donald Medley, PharmD, BCPS []  Rocky Bold, PharmD []  Dorothyann Alert, PharmD, BCPS [x]  Dorn Poot, PharmD  [facility] []  Rosaline Edison, PharmD []  Romona Bliss, PharmD []  Dolphus Roller, PharmD []  Veva Seip, Rph []  Vernell Daunt) Leonce, PharmD []  Eva Allis, PharmD []  Rosaline Millet, PharmD []  Iantha Batch, PharmD []  Arvin Gauss, PharmD []  Wanda Hasting, PharmD []  Ronal Rav, PharmD []  Rocky Slade, PharmD []  Bard Jeans, PharmD   Positive blood culture Pt currently admitted to hospital for treatment.  [name] 01/08/2024, 10:03 AM

## 2024-01-08 NOTE — Progress Notes (Signed)
 PROGRESS NOTE    [name]  FMW:[id number] DOB: 03-Sep-1960 DOA: 01/06/2024 PCP: Delbert Clam, MD   Brief Narrative:  [name] is a 63 y.o. year old patient with prostate cancer who underwent RALP with b/l PLND on 12/26/2023 without complication and discharged on 12/27/2023.  Patient presents to our facility 01/06/2024 after previous blood cultures reported positive for E. coli.  Prior hospital course: after previous procedure patient had notable pain at home with worsening Foley drainage, nausea, vomiting. He was evaluated 7/30 by urology who replaced Foley which appears to have been poorly positioned after discharge.  1 week later on 8/6 patient had syncopal event at home with abdominal pain found to be hypotensive/hypovolemic in the ED but improved with bolus.  CT during this evaluation noted fluid collection in the pelvis which appears to have increased in size since prior hospitalization.  Assessment & Plan:   Principal Problem:   Sepsis secondary to UTI (HCC)  E. coli bacteremia Sepsis, POA -Source likely urogenital given recent surgery evaluation and Foley being previously dislodged - Continue ceftriaxone , supportive care  Prostate cancer status post robotic assisted laparoscopic radical prostatectomy with bilateral lymph node dissection 12/06/2023 -Urology following, noted [id number] GG2 disease  Pelvic fluid collection, rule our urinary/bladder -Unspecified, concern for possible urine leak, ascites versus underlying infection - Fluid drainage/tube placement 8/9 per radiology -culture negative to date - Plan for repair of vesicular urethral anastomotic leak 01/08/2024 per urology, bilateral ureteral stent placement   AKI -continue IV fluids, advance diet as tolerated postprocedure HTN - well controlled - hold home meds to avoid hypotension CAD -  HLD - hold Evolucomab while in hospital, continue statin   DVT prophylaxis: SCDs Start: 01/06/24 1546   Code  Status:   Code Status: Full Code  Family Communication: Wife at bedside  Status is: Inpatient  Dispo: The patient is from: Home              Anticipated d/c is to: Home              Anticipated d/c date is: 24 to 48 hours              Patient currently not medically stable for discharge  Consultants:  Urology, interventional radiology  Procedures:  Pelvic drain placement  Antimicrobials:  Ceftriaxone   Subjective: No acute issues or events overnight denies nausea vomit diarrhea constipation any fever chills chest pain  Objective: Vitals:   01/08/24 0400 01/08/24 0500 01/08/24 0600 01/08/24 0700  BP: (!) 165/77 (!) 167/76 (!) 122/46 (!) 161/75  Pulse: 76 68 63   Resp: 19 16 15    Temp:    98.6 F (37 C)  TempSrc:      SpO2: 98% 99% 98% 99%  Weight:      Height:        Intake/Output Summary (Last 24 hours) at 01/08/2024 0756 Last data filed at 01/08/2024 0600 Gross per 24 hour  Intake 100 ml  Output 1355 ml  Net -1255 ml   Filed Weights   01/06/24 1044  Weight: 69.2 kg    Examination:  General exam: Appears calm and comfortable  Respiratory system: Clear to auscultation. Respiratory effort normal. Cardiovascular system: S1 & S2 heard, RRR. No JVD, murmurs, rubs, gallops or clicks. No pedal edema. Gastrointestinal system: Abdomen is nondistended, soft and nontender. No organomegaly or masses felt. Normal bowel sounds heard. Central nervous system: Alert and oriented. No focal neurological deficits. Extremities: Symmetric 5 x 5 power. Skin: No  rashes, lesions or ulcers Psychiatry: Judgement and insight appear normal. Mood & affect appropriate.     Data Reviewed: I have personally reviewed following labs and imaging studies  CBC: Recent Labs  Lab 01/04/24 0952 01/04/24 0953 01/06/24 1215 01/07/24 0300 01/08/24 0300  WBC 7.3  --  18.0* 16.9* 12.5*  NEUTROABS 5.2  --  16.7*  --   --   HGB 9.3* 10.2* 7.9* 8.1* 7.5*  HCT 29.0* 30.0* 24.6* 25.7* 23.5*   MCV 95.7  --  93.9 95.2 94.4  PLT 472*  --  474* 466* 481*   Basic Metabolic Panel: Recent Labs  Lab 01/04/24 0952 01/04/24 0953 01/06/24 1215 01/07/24 0300 01/08/24 0300  NA 134* 136 137 136 136  K 3.4* 3.4* 3.7 4.0 3.7  CL 101 99 101 101 102  CO2 23  --  24 24 21*  GLUCOSE 140* 137* 90 87 88  BUN 12 12 27* 22 24*  CREATININE 1.54* 1.50* 1.67* 1.38* 1.29*  CALCIUM  8.5*  --  9.0 8.6* 8.6*   GFR: Estimated Creatinine Clearance: 57.4 mL/min (A) (by C-G formula based on SCr of 1.29 mg/dL (H)). Liver Function Tests: Recent Labs  Lab 01/04/24 0952 01/06/24 1546 01/07/24 0300  AST 15 19 21   ALT 11 13 12   ALKPHOS 48 49 52  BILITOT 0.9 0.8 1.2  PROT 6.4* 6.4* 7.1  ALBUMIN  3.1* 2.7* 2.9*   Recent Labs  Lab 01/04/24 0953  LIPASE 28   No results for input(s): AMMONIA in the last 168 hours. Coagulation Profile: Recent Labs  Lab 01/04/24 0952 01/07/24 0300  INR 1.1 1.2   Cardiac Enzymes: No results for input(s): CKTOTAL, CKMB, CKMBINDEX, TROPONINI in the last 168 hours. BNP (last 3 results) No results for input(s): PROBNP in the last 8760 hours. HbA1C: No results for input(s): HGBA1C in the last 72 hours. CBG: Recent Labs  Lab 01/04/24 0948  GLUCAP 144*   Lipid Profile: No results for input(s): CHOL, HDL, LDLCALC, TRIG, CHOLHDL, LDLDIRECT in the last 72 hours. Thyroid  Function Tests: No results for input(s): TSH, T4TOTAL, FREET4, T3FREE, THYROIDAB in the last 72 hours. Anemia Panel: No results for input(s): VITAMINB12, FOLATE, FERRITIN, TIBC, IRON, RETICCTPCT in the last 72 hours. Sepsis Labs: Recent Labs  Lab 01/04/24 1034 01/04/24 1309 01/06/24 1544 01/06/24 1849  LATICACIDVEN 1.8 1.6 0.9 0.9    Recent Results (from the past 240 hours)  Blood culture (routine x 2)     Status: None (Preliminary result)   Collection Time: 01/04/24 12:42 PM   Specimen: BLOOD  Result Value Ref Range Status   Specimen  Description BLOOD RIGHT ANTECUBITAL  Final   Special Requests   Final    BOTTLES DRAWN AEROBIC AND ANAEROBIC Blood Culture adequate volume   Culture   Final    NO GROWTH 3 DAYS Performed at [facility], [address] [place], [place] [address]    Report Status PENDING  Incomplete  Blood culture (routine x 2)     Status: Abnormal   Collection Time: 01/04/24  1:00 PM   Specimen: BLOOD RIGHT HAND  Result Value Ref Range Status   Specimen Description BLOOD RIGHT HAND  Final   Special Requests   Final    BOTTLES DRAWN AEROBIC AND ANAEROBIC Blood Culture adequate volume   Culture  Setup Time   Final    GRAM NEGATIVE RODS AEROBIC BOTTLE ONLY CRITICAL RESULT CALLED TO, READ BACK BY AND VERIFIED WITH: ED CHARGE RN MICHAEL D.  [phone] AT 0959, ADC Performed at [facility], [address] [place], [place] [address]    Culture ESCHERICHIA COLI (A)  Final   Report Status 01/07/2024 FINAL  Final   Organism ID, Bacteria ESCHERICHIA COLI  Final   Organism ID, Bacteria ESCHERICHIA COLI  Final      Susceptibility   Escherichia coli - KIRBY BAUER*    CEFAZOLIN  SENSITIVE Sensitive    Escherichia coli - MIC*    AMPICILLIN <=2 SENSITIVE Sensitive     CEFEPIME <=0.12 SENSITIVE Sensitive     CEFTAZIDIME <=1 SENSITIVE Sensitive     CEFTRIAXONE  <=0.25 SENSITIVE Sensitive     CIPROFLOXACIN <=0.25 SENSITIVE Sensitive     GENTAMICIN <=1 SENSITIVE Sensitive     IMIPENEM <=0.25 SENSITIVE Sensitive     TRIMETH /SULFA  <=20 SENSITIVE Sensitive     AMPICILLIN/SULBACTAM <=2 SENSITIVE Sensitive     PIP/TAZO <=4 SENSITIVE Sensitive ug/mL    * ESCHERICHIA COLI    ESCHERICHIA COLI  Blood Culture ID Panel (Reflexed)     Status: Abnormal   Collection Time: 01/04/24  1:00 PM  Result Value Ref Range Status   Enterococcus faecalis NOT DETECTED NOT DETECTED Final   Enterococcus Faecium NOT DETECTED NOT DETECTED Final   Listeria monocytogenes NOT DETECTED NOT DETECTED Final   Staphylococcus species NOT  DETECTED NOT DETECTED Final   Staphylococcus aureus (BCID) NOT DETECTED NOT DETECTED Final   Staphylococcus epidermidis NOT DETECTED NOT DETECTED Final   Staphylococcus lugdunensis NOT DETECTED NOT DETECTED Final   Streptococcus species NOT DETECTED NOT DETECTED Final   Streptococcus agalactiae NOT DETECTED NOT DETECTED Final   Streptococcus pneumoniae NOT DETECTED NOT DETECTED Final   Streptococcus pyogenes NOT DETECTED NOT DETECTED Final   A.calcoaceticus-baumannii NOT DETECTED NOT DETECTED Final   Bacteroides fragilis NOT DETECTED NOT DETECTED Final   Enterobacterales DETECTED (A) NOT DETECTED Final    Comment: Enterobacterales represent a large order of gram negative bacteria, not a single organism. CRITICAL RESULT CALLED TO, READ BACK BY AND VERIFIED WITH: ED CHARGE RN MICHAEL D. [phone] AT [phone], ADC    Enterobacter cloacae complex NOT DETECTED NOT DETECTED Final   Escherichia coli DETECTED (A) NOT DETECTED Final    Comment: CRITICAL RESULT CALLED TO, READ BACK BY AND VERIFIED WITH: ED CHARGE RN MICHAEL D. [phone] AT 0959, ADC    Klebsiella aerogenes NOT DETECTED NOT DETECTED Final   Klebsiella oxytoca NOT DETECTED NOT DETECTED Final   Klebsiella pneumoniae NOT DETECTED NOT DETECTED Final   Proteus species NOT DETECTED NOT DETECTED Final   Salmonella species NOT DETECTED NOT DETECTED Final   Serratia marcescens NOT DETECTED NOT DETECTED Final   Haemophilus influenzae NOT DETECTED NOT DETECTED Final   Neisseria meningitidis NOT DETECTED NOT DETECTED Final   Pseudomonas aeruginosa NOT DETECTED NOT DETECTED Final   Stenotrophomonas maltophilia NOT DETECTED NOT DETECTED Final   Candida albicans NOT DETECTED NOT DETECTED Final   Candida auris NOT DETECTED NOT DETECTED Final   Candida glabrata NOT DETECTED NOT DETECTED Final   Candida krusei NOT DETECTED NOT DETECTED Final   Candida parapsilosis NOT DETECTED NOT DETECTED Final   Candida tropicalis NOT DETECTED NOT DETECTED Final    Cryptococcus neoformans/gattii NOT DETECTED NOT DETECTED Final   CTX-M ESBL NOT DETECTED NOT DETECTED Final   Carbapenem resistance IMP NOT DETECTED NOT DETECTED Final   Carbapenem resistance KPC NOT DETECTED NOT DETECTED Final   Carbapenem resistance NDM NOT DETECTED NOT DETECTED Final   Carbapenem resist OXA 48  LIKE NOT DETECTED NOT DETECTED Final   Carbapenem resistance VIM NOT DETECTED NOT DETECTED Final    Comment: Performed at [facility], [address] [place], [place] [address]  Blood culture (routine x 2)     Status: None (Preliminary result)   Collection Time: 01/06/24 12:15 PM   Specimen: BLOOD  Result Value Ref Range Status   Specimen Description BLOOD SITE NOT SPECIFIED  Final   Special Requests   Final    BOTTLES DRAWN AEROBIC AND ANAEROBIC Blood Culture adequate volume   Culture   Final    NO GROWTH < 24 HOURS Performed at [facility], [address], [place], [place] [address]    Report Status PENDING  Incomplete  Blood culture (routine x 2)     Status: None (Preliminary result)   Collection Time: 01/06/24 12:20 PM   Specimen: BLOOD LEFT ARM  Result Value Ref Range Status   Specimen Description BLOOD LEFT ARM  Final   Special Requests   Final    BOTTLES DRAWN AEROBIC AND ANAEROBIC Blood Culture adequate volume   Culture   Final    NO GROWTH < 24 HOURS Performed at [facility], [address], [place], [place] [address]    Report Status PENDING  Incomplete  Culture, Urine (Do not remove urinary catheter, catheter placed by urology or difficult to place)     Status: Abnormal   Collection Time: 01/06/24  3:49 PM   Specimen: Urine, Catheterized  Result Value Ref Range Status   Specimen Description URINE, CATHETERIZED  Final   Special Requests NONE  Final   Culture (A)  Final    <10,000 COLONIES/mL INSIGNIFICANT GROWTH Performed at [facility], [address] [place], [place] [address]    Report Status 01/07/2024 FINAL  Final  Body  fluid culture w Gram Stain     Status: None (Preliminary result)   Collection Time: 01/07/24 12:29 PM   Specimen: Pelvis; Body Fluid  Result Value Ref Range Status   Specimen Description   Final    PELVIS Performed at [facility], [address], [place], [place] [address]    Special Requests   Final    NONE Performed at [facility], [address], [place], [place] [address]    Gram Stain   Final    ABUNDANT WBC PRESENT, PREDOMINANTLY PMN NO ORGANISMS SEEN Performed at [facility], [address], [place], [place] [address]    Culture PENDING  Incomplete   Report Status PENDING  Incomplete  Aerobic/Anaerobic Culture w Gram Stain (surgical/deep wound)     Status: None (Preliminary result)   Collection Time: 01/07/24  1:40 PM   Specimen: Pelvis; Peritoneal Fluid  Result Value Ref Range Status   Specimen Description   Final    PELVIS Performed at [facility], [address], [place], [place] [address]    Special Requests   Final    NONE Performed at [facility], [address], [place], [place] [address]    Gram Stain   Final    MODERATE WBC PRESENT, PREDOMINANTLY PMN NO ORGANISMS SEEN    Culture   Final    NO GROWTH < 24 HOURS Performed at [facility], [address], [place], [place] [address]    Report Status PENDING  Incomplete         Radiology Studies: CT GUIDED PERITONEAL/RETROPERITONEAL FLUID DRAIN  BY PERC CATH Result Date: 01/07/2024 INDICATION: 63 year old with history of prostatectomy and pelvic fluid collection. Concern for a urinary leak. EXAM: CT GUIDED DRAIN PLACEMENT IN PELVIC FLUID COLLECTION TECHNIQUE: Multidetector CT imaging of the pelvis was performed following the standard protocol with and without contrast IV contrast. RADIATION DOSE REDUCTION: This exam was performed according to the departmental dose-optimization program which includes automated exposure  control, adjustment of the mA and/or kV according to patient size and/or use of iterative reconstruction technique. CONTRAST:  50 mL Omnipaque  300 MEDICATIONS: Moderate sedation ANESTHESIA/SEDATION: Moderate (conscious) sedation was employed during this procedure. A total of Versed  4 mg and Fentanyl  200 mcg was administered intravenously by the radiology nurse. Total intra-service moderate Sedation Time: 50 minutes. The patient's level of consciousness and vital signs were monitored continuously by radiology nursing throughout the procedure under my direct supervision. COMPLICATIONS: None immediate. PROCEDURE: Informed written consent was obtained from the patient after a thorough discussion of the procedural risks, benefits and alternatives. All questions were addressed. A timeout was performed prior to the initiation of the procedure. Patient was placed supine on CT scanner. CT images through the pelvis were obtained without contrast. Additional CT images were obtained after the administration intravenous contrast to better characterize the fluid collections. Patient was placed prone and additional CT images were obtained. Urinary leak was identified at this time. Findings discussed with Dr. Selma in urology. We decided to proceed with a transgluteal pelvic drain placement. The left buttock was prepped with chlorhexidine  and sterile field was created. Maximal barrier sterile technique was utilized including caps, mask, sterile gowns, sterile gloves, sterile drape, hand hygiene and skin antiseptic. Skin was anesthetized with 1% lidocaine . Small incision was made. Using CT guidance, an 18 gauge trocar needle was directed into the left posterior fluid collection from a transgluteal approach. Yellow fluid was aspirated. Superstiff Amplatz wire was placed. The tract was dilated to accommodate a 10 [place] multipurpose drain. Additional yellow fluid was obtained. Fluid was sent for culture and creatinine level. Drain was  attached to a suction bulb and sutured to the skin. Dressing was placed. FINDINGS: Initial set of images demonstrated a complex fluid collection in the pelvis surrounding the rectum. In addition, there appeared to be a new fluid collection in the right lower quadrant extending to the pelvis. Subsequent images were obtained with the patient prone and there was contrast extravasating in the pelvis. There appears to be a leak on image 47, sequence number 7. This leak is near the base of the bladder along the left posterior aspect. There is contrast extravasating anterior and posterior to the bladder. Drain placed from a left transgluteal approach. Drain is situated within the left component of the pelvic fluid collection. Yellow fluid was aspirated from the drain. IMPRESSION: 1. Positive for a urinary leak. Leak is located along the left posterior aspect of the urethra near the bladder base. 2. CT-guided placement of a left transgluteal pelvic drain. Electronically Signed   By: Juliene Balder M.D.   On: 01/07/2024 19:53   CT CYSTOGRAM PELVIS Result Date: 01/07/2024 CLINICAL DATA:  History of recent prostatectomy with recurrent pelvic fluid collection which has been drained. Bladder leak was noted during the drainage catheter placement. EXAM: CT CYSTOGRAM (CT ABDOMEN AND PELVIS WITH CONTRAST) TECHNIQUE: Multi-detector CT imaging through the abdomen and pelvis was performed after dilute contrast had been introduced into the bladder for the purposes of performing CT cystography. RADIATION DOSE REDUCTION: This exam was performed according to the departmental  dose-optimization program which includes automated exposure control, adjustment of the mA and/or kV according to patient size and/or use of iterative reconstruction technique. CONTRAST:  100mL OMNIPAQUE  IOHEXOL  300 MG/ML  SOLN COMPARISON:  Drainage procedure from earlier in the same day as well as CT from the previous day. FINDINGS: Lower chest: Mild atelectatic changes  are noted in the bases bilaterally. Hepatobiliary: No focal liver abnormality is seen. No gallstones, gallbladder wall thickening, or biliary dilatation. Vicarious excretion of contrast is noted within the gallbladder. Pancreas: Unremarkable. No pancreatic ductal dilatation or surrounding inflammatory changes. Spleen: Normal in size without focal abnormality. Adrenals/Urinary Tract: Adrenal glands are within normal limits. Kidneys demonstrate a normal enhancement pattern bilaterally. Normal excretion is seen. The bladder is distended with contrast enhanced urine related to retrograde placement via the Foley catheter as well as recently injected IV contrast. No obstructive changes are seen. The bladder is well distended and there is a large bladder wall defect noted at the bladder neck inferiorly and posteriorly on the left similar to that seen on the recent CT intervention. Apparent extension into the proximal urethra is noted. Considerable extravasation of contrast is noted around the base of the bladder extraperitoneally. There does not appear to be any passage of contrast material into the recently drained fluid collection which appears decreased in size when compared with the prior exam. Additionally no significant passage of contrast into the peritoneal space is noted. Stomach/Bowel: No obstructive or inflammatory changes of the colon are seen. Mild small bowel dilatation is noted which appears increased when compare with the prior exam which may represent a degree of postoperative ileus reactive to the free fluid in the pelvis and peritoneal cavity. Stomach is well distended with fluid. The appendix is not well appreciated. Vascular/Lymphatic: Aortic atherosclerosis. No enlarged abdominal or pelvic lymph nodes. Reproductive: Prostate has been surgically removed. Other: Multiple fluid collections are identified within the abdomen. One lies along the inferior margin of the liver interposed between the liver and  the right kidney which measures approximately 4.2 x 2.6 cm best seen on image number 17 of series 7. A second larger collection is noted in the right hemiabdomen which is somewhat lunate in shape measuring approximately 9.0 x 3.0 cm best seen on image number 30 of series 7. Additional right-sided collection is noted inferiorly best seen on image number 45 of series 7 measuring up to 8.3 cm. The known recently drained pelvic collection has decreased significantly in size. Musculoskeletal: No acute bony abnormality is noted. IMPRESSION: Large defect in the bladder neck and proximal urethra posteriorly on the left similar to that seen on the prior interventional CT images. The defect measures approximately 13 mm on the coronal images and up to 2 cm posteriorly on the sagittal delayed images. On the axial images it measures approximately 10 mm in AP dimension. Extraperitoneal extravasation of contrast is noted. No intraperitoneal involvement is seen. Increase in size and number of additional fluid collections within the peritoneum as described. These are new when compared with the exam from the previous day. Mild small bowel dilatation is noted which may be related to a degree of ileus reactive to the fluid changes within the abdomen. Correlation with physical exam is recommended. Interval placement of drainage catheter in the left inferior pelvis with significant decompression of the previously seen fluid collection. Electronically Signed   By: Oneil Devonshire M.D.   On: 01/07/2024 19:23   CT HEMATURIA WORKUP Result Date: 01/07/2024 CLINICAL DATA:  Hematuria. EXAM: CT  ABDOMEN AND PELVIS WITHOUT AND WITH CONTRAST TECHNIQUE: Multidetector CT imaging of the abdomen and pelvis was performed following the standard protocol before and following the bolus administration of intravenous contrast. RADIATION DOSE REDUCTION: This exam was performed according to the departmental dose-optimization program which includes automated  exposure control, adjustment of the mA and/or kV according to patient size and/or use of iterative reconstruction technique. CONTRAST:  OMNIPAQUE  IOHEXOL  300 MG/ML  SOLN COMPARISON:  CT abdomen and pelvis 01/06/2024 FINDINGS: Lower chest: There is mild atelectasis in the lung bases. Hepatobiliary: There is some excreted contrast in the gallbladder. Otherwise, liver, gallbladder and bile ducts are within normal limits. Pancreas: Unremarkable. No pancreatic ductal dilatation or surrounding inflammatory changes. Spleen: Normal in size without focal abnormality. Adrenals/Urinary Tract: There is a rounded hypodensity in the inferior pole the right kidney most compatible with cysts measuring 1 cm. No other focal renal lesions are identified. Kidneys are normal in size and contour. There is no perinephric fluid or stranding. There is a small amount of residual contrast seen in the kidneys on precontrast imaging which can be seen with acute renal function disorder. There is no hydronephrosis. The bilateral ureters are opacified to the level of the pelvic inlet and appear within normal limits. The distal ureters are not well opacified. There is a Foley catheter in the bladder. The bladder is distended with contrast. There is bladder rupture with leakage of contrast from the inferior left bladder wall extending into extraperitoneal spaces inferiorly and anteriorly bilaterally. Stomach/Bowel: There are numerous dilated small bowel loops measuring up to 4.2 cm with air-fluid levels. These have increased in size when compared to prior. No definitive transition point visualized. The colon appears nondilated. There is a moderate air-fluid level in the stomach. Vascular/Lymphatic: The aorta and IVC are normal in size. There are atherosclerotic calcifications of the aorta. No enlarged lymph nodes are identified. Reproductive: Prostate gland is not well defined. Other: There is a new posterior percutaneous catheter with distal  tip in the upper left pelvis. Previously identified enhancing fluid collection in the posterior pelvis has slightly decreased in size but now contains air. This collection measures 6.5 x 4.5 cm (previously 9.5 x 7.3 cm on similar image. There is new enhancing fluid collection in the paracolic gutter abutting multiple small bowel loops. This collection measures approximately 8.0 x 3.7 by 10.0 cm. There is also a new small amount of fluid between the inferior margin of the liver and right kidney without enhancement. This fluid measures 1.7 x 3.6 by 7.5 cm. Musculoskeletal: No fracture is seen. IMPRESSION: 1. Extraperitoneal bladder rupture with leakage of contrast from the inferior left bladder wall extending into extraperitoneal spaces inferiorly and anteriorly bilaterally. 2. New posterior percutaneous catheter with distal tip in the upper left pelvis. 3. Previously identified enhancing fluid collection in the posterior pelvis has slightly decreased in size but now contains air. 4. New enhancing fluid collection in the right paracolic gutter abutting multiple small bowel loops measuring 8.0 x 3.7 x 10.0 cm. 5. New small amount of fluid between the inferior margin of the liver and right kidney without enhancement. 6. New small bilateral pleural effusions. 7. New small amount of residual contrast in the kidneys on precontrast imaging which can be seen with acute renal injury. 8. Increasing size of small bowel loops with air-fluid levels. No definitive transition point visualized. Findings may be related to ileus or partial small bowel obstruction. 9. Aortic atherosclerosis. Aortic Atherosclerosis (ICD10-I70.0). Electronically Signed   By:  Greig Pique M.D.   On: 01/07/2024 19:15   CT ABDOMEN PELVIS W CONTRAST Result Date: 01/06/2024 CLINICAL DATA:  Abdominal pain, post-op. * Tracking Code: BO * EXAM: CT ABDOMEN AND PELVIS WITH CONTRAST TECHNIQUE: Multidetector CT imaging of the abdomen and pelvis was performed  using the standard protocol following bolus administration of intravenous contrast. RADIATION DOSE REDUCTION: This exam was performed according to the departmental dose-optimization program which includes automated exposure control, adjustment of the mA and/or kV according to patient size and/or use of iterative reconstruction technique. CONTRAST:  75mL OMNIPAQUE  IOHEXOL  350 MG/ML SOLN COMPARISON:  CT scan abdomen and pelvis from 01/04/2024. FINDINGS: Lower chest: There are subpleural atelectatic changes in the visualized lung bases. No overt consolidation. No pleural effusion. The heart is normal in size. No pericardial effusion. Hepatobiliary: The liver is normal in size. Non-cirrhotic configuration. No suspicious mass. No intrahepatic or extrahepatic bile duct dilation. There are new hyperattenuating areas in the dependent portion of the gallbladder, which may represent vicarious excretion of previously administered intravenous contrast. Normal gallbladder wall thickness. No pericholecystic inflammatory changes. Pancreas: Unremarkable. No pancreatic ductal dilatation or surrounding inflammatory changes. Spleen: Within normal limits. No focal lesion. Adrenals/Urinary Tract: Adrenal glands are unremarkable. No suspicious renal mass. No hydronephrosis. No renal or ureteric calculi. Urinary bladder is decompressed secondary to Foley catheter, which appears in satisfactory position Stomach/Bowel: No disproportionate dilation of the small or large bowel loops. Appendix is not distinctly visualized. Vascular/Lymphatic: Redemonstration of fluid collection in the dependent pelvis currently measuring up to 7.3 x 9.6 cm, increased since the prior study. The collection exhibits thin hyperattenuating incomplete walls. Findings again may represent postoperative seroma versus developing abscess. There is also redemonstration of fluid and small amount of nondependent air in the space of Retzius, surrounding the urinary bladder  as well as along the bilateral pelvic sidewalls, which is likely secondary to false passage/malposition of the Foley bulb seen on the prior exam. No abdominal or pelvic lymphadenopathy, by size criteria. No aneurysmal dilation of the major abdominal arteries. There are moderate peripheral atherosclerotic vascular calcifications of the aorta and its major branches. Reproductive: There is history of prostate resection. Other: Supraumbilical midline surgical scar noted. Musculoskeletal: No suspicious osseous lesions. There are mild multilevel degenerative changes in the visualized spine. IMPRESSION: 1. Redemonstration of fluid collection in the dependent pelvis, which has increased since the prior study. The collection exhibits thin hyperattenuating incomplete walls. Findings again may represent postoperative seroma versus developing abscess. 2. There is also redemonstration of fluid and small amount of nondependent air in the Space of Retzius, surrounding the urinary bladder as well as along the bilateral pelvic sidewalls, which is likely secondary to false passage/malposition of the Foley bulb seen on the prior exam. 3. Multiple other nonacute observations, as described above. Aortic Atherosclerosis (ICD10-I70.0). Electronically Signed   By: Ree Molt M.D.   On: 01/06/2024 15:03   Scheduled Meds:  [MAR Hold] Chlorhexidine  Gluconate Cloth  6 each Topical Q2200   [MAR Hold] DULoxetine   60 mg Oral Daily   [MAR Hold] feeding supplement  237 mL Oral BID BM   [MAR Hold] pantoprazole   40 mg Oral BID   [MAR Hold] rosuvastatin   40 mg Oral Daily   Continuous Infusions:  [MAR Hold] cefTRIAXone  (ROCEPHIN )  IV Stopped (01/07/24 0927)     LOS: 2 days   Time spent:  Elsie JAYSON [place], DO [facility]  If 7PM-7AM, please contact night-coverage [email or web address]  01/08/2024, 7:56 AM

## 2024-01-08 NOTE — Consult Note (Addendum)
 Urology Consult   Physician requesting consult: Prentice Medicus, MD   Reason for consult: Bacteremia, hx of prostate cancer s/p prostatectomy  History of Present Illness:  [name] is a 63 y.o. 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   Of note, intraoperatively, he had a negative urine leak test.  JP drain was removed POD1 prior to discharge with seroequivalent creatinine without evidence of urine leak.  He was discharged home on postoperative day 1 and presented on postoperative day 2 in the evening with complaints of poor catheter drainage, abdominal pain, nausea and emesis.  Attempts to irrigate the catheter were unsuccessful. At the time, it was apparent that the catheter had been dislodged distally. Catheter was deflated and advanced back into the bladder with immediate return of 1 L clear yellow urine.  Balloon was reinflated with 15 cc sterile water .  He was also found to have AKI with creatinine 5.  CT A/P 12/29/2023 revealed free fluid and air related to recent surgery however expected postoperative surgical changes and bladder decompressed with Foley catheter.  His creatinine down trended that day and he had excellent urine output.   Over the next week, he reported good urine output and this has been recorded well by his wife. He noted decreased urine output on 8/6 as well as lower abdominal pain.  He also had a syncopal episode at that time and presented to the ED.  CT head and MRI brain were obtained and were unremarkable. Of note, CT abdomen pelvis 01/04/2024 revealed fluid collection in the dependent pelvis measuring 4 x 7 cm as well as Foley catheter in place however balloon appeared to be inferior to the bladder on the left paramedian side.  The bladder was  mildly distended.  I removed Foley catheter on 8/6 and performed bedside cystoscopy and was able to navigate into the bladder where I passed a wire and then a council catheter over the wire.  Visualization was difficult and there is some suspicion for compromised vesicourethral anastomosis.  Foley catheter drained well and he was discharged home.  Blood cultures obtained on 8/6 resulted positive for E. coli and thus patient was called to return to the ED for IV antibiotics.  Patient reports he has been doing well over the past 48 hours.  He has had decreased abdominal pain.  He denies fevers.  He has been tolerating some diet.  His Foley catheter has been draining yellow urine.  Repeat CT A/P 01/06/2024 reveals redemonstration of pelvic fluid collection has increased in size now measuring 7 x 9 cm.  There is also some fluid around the space of Retzius.  Foley catheter is seen within the bladder and the bladder is decompressed.  Interval 8/10: AFVSS on room air, adequate UoP, s/p transgluteal drain placement into one of the pelvic fluid collections with serous creatinine and no growth on cultures, leukocytosis downtrending (12.5 from 16.9), Cr downtrending (1.29 from 1.38), 1/2 blood cultures from 8/6 with E. Coli, repeat urine and blood cultures pending.   Imaging with note made of left inferior bladder defect with leakage of contrast extraperitoneally as well as separate fluid collections from the one drained by VIR, distal ureters poorly visualized; however low suspicion of ureteral injury.    Past Medical History:  Diagnosis Date   Apical variant hypertrophic cardiomyopathy (HCC)  CAD (coronary artery disease)    a. LHC in 2011 with severe dz in D1/D2 and very distal LAD--> too small for PCI and managed medically.  b.  LHC in 10/2013 after an abnormal nuclear study w/ non-obst dz/c. 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   HOH (hard of hearing)    rgiht ear   Hyperlipidemia    Hypertension    Myocardial infarction [facility])    Peripheral vascular disease (HCC)    Stroke [facility])     Past Surgical History:  Procedure Laterality Date   CARDIAC CATHETERIZATION N/A 02/24/2016   Procedure: Left Heart Cath and Coronary Angiography;  Surgeon: Ozell Fell, MD;  Location: [facility] INVASIVE CV LAB;  Service: Cardiovascular;  Laterality: N/A;   CARDIAC CATHETERIZATION N/A 02/24/2016   Procedure: Coronary Stent Intervention;  Surgeon: Ozell Fell, MD;  Location: [facility] INVASIVE CV LAB;  Service: Cardiovascular;  Laterality: N/A;   CORONARY ANGIOPLASTY WITH STENT PLACEMENT  2017   CORONARY BALLOON ANGIOPLASTY N/A 04/30/2019   Procedure: CORONARY BALLOON ANGIOPLASTY;  Surgeon: Swaziland, Peter M, MD;  Location: [facility] INVASIVE CV LAB;  Service: Cardiovascular;  Laterality: N/A;   INGUINAL HERNIA REPAIR Left    LEFT HEART CATH AND CORONARY ANGIOGRAPHY N/A 04/30/2019   Procedure: LEFT HEART CATH AND CORONARY ANGIOGRAPHY;  Surgeon: Cherrie Toribio SAUNDERS, MD;  Location: [facility] INVASIVE CV LAB;  Service: Cardiovascular;  Laterality: N/A;   LEFT HEART CATH AND CORONARY ANGIOGRAPHY N/A 05/14/2019   Procedure: LEFT HEART CATH AND CORONARY ANGIOGRAPHY;  Surgeon: Swaziland, Peter M, MD;  Location: [facility] INVASIVE CV LAB;  Service: Cardiovascular;  Laterality: N/A;   LEFT HEART CATHETERIZATION WITH CORONARY ANGIOGRAM N/A 11/22/2013   Procedure: LEFT HEART CATHETERIZATION WITH CORONARY ANGIOGRAM;  Surgeon: Victory LELON Claudene DOUGLAS, MD;  Location: [facility] CATH LAB;  Service: Cardiovascular;  Laterality: N/A;   ROBOT ASSISTED LAPAROSCOPIC RADICAL PROSTATECTOMY N/A 12/26/2023   Procedure:  PROSTATECTOMY, RADICAL, ROBOT-ASSISTED, LAPAROSCOPIC;  Surgeon: Selma Donnice SAUNDERS, MD;  Location: [facility] ORS;  Service: Urology;  Laterality: N/A;   TEE WITHOUT CARDIOVERSION N/A 08/22/2015   Procedure: TRANSESOPHAGEAL ECHOCARDIOGRAM (TEE);  Surgeon: Aleene JINNY Passe, MD;  Location: [facility] ENDOSCOPY;  Service: Cardiovascular;  Laterality: N/A;    Current Hospital Medications:  Home Meds:  No current facility-administered medications on file prior to encounter.   Current Outpatient Medications on File Prior to Encounter  Medication Sig Dispense Refill   diphenhydramine -acetaminophen  (TYLENOL  PM) 25-500 MG TABS tablet Take 1 tablet by mouth at bedtime as needed (for pain,sleep).     docusate sodium  (COLACE) 100 MG capsule Take 1 capsule (100 mg total) by mouth 2 (two) times daily.     DULoxetine  (CYMBALTA ) 60 MG capsule Take 1 capsule (60 mg total) by mouth daily. For chronic pain 90 capsule 1   Evolocumab  (REPATHA  SURECLICK) 140 MG/ML SOAJ INJECT 140 MG INTO THE SKIN EVERY 14 (FOURTEEN) DAYS. 6 mL 3   HYDROcodone -acetaminophen  (NORCO/VICODIN) 5-325 MG tablet Take 1-2 tablets by mouth every 6 (six) hours as needed for moderate pain (pain score 4-6) or severe pain (pain  score 7-10). 20 tablet 0   lactulose  (CHRONULAC ) 10 GM/15ML solution Take 15 mLs (10 g total) by mouth 3 (three) times daily. 946 mL 0   losartan -hydrochlorothiazide  (HYZAAR ) 100-25 MG tablet Take 1 tablet by mouth daily. 90 tablet 1   ranolazine  (RANEXA ) 500 MG 12 hr tablet Take 1 tablet (500 mg total) by mouth 2 (two) times daily. 180 tablet 1   rosuvastatin  (CRESTOR ) 40 MG tablet Take 1 tablet (40 mg total) by mouth daily. 90 tablet 1   amLODipine  (NORVASC ) 10 MG tablet Take 1 tablet (10 mg total) by mouth daily. 90 tablet 1   nitroGLYCERIN  (NITROSTAT ) 0.4 MG SL tablet Place 1 tablet (0.4 mg total) under the tongue every 5 (five) minutes as needed for chest pain. 25 tablet 3     Scheduled Meds:  Chlorhexidine  Gluconate Cloth  6 each  Topical Q2200   DULoxetine   60 mg Oral Daily   feeding supplement  237 mL Oral BID BM   pantoprazole   40 mg Oral BID   rosuvastatin   40 mg Oral Daily   Continuous Infusions:  cefTRIAXone  (ROCEPHIN )  IV Stopped (01/07/24 0927)   PRN Meds:.HYDROcodone -acetaminophen , mouth rinse  Allergies:  Allergies  Allergen Reactions   Lisinopril Swelling    lips swelling    Family History  Problem Relation Age of Onset   Hypertension Mother    Heart failure Mother    Hypertension Father    Colon cancer Father 47   Hypertension Brother    Heart disease Son    Heart disease Son    Colon cancer Paternal Uncle     Social History:  reports that he has quit smoking. His smoking use included cigarettes. He has never used smokeless tobacco. He reports that he does not drink alcohol and does not use drugs.  ROS: A complete review of systems was performed.  All systems are negative except for pertinent findings as noted.  Physical Exam:  Vital signs in last 24 hours: Temp:  [98.1 F (36.7 C)-99.5 F (37.5 C)] 98.4 F (36.9 C) (08/10 0300) Pulse Rate:  [62-91] 63 (08/10 0600) Resp:  [12-33] 15 (08/10 0600) BP: (117-170)/(46-92) 122/46 (08/10 0600) SpO2:  [94 %-100 %] 98 % (08/10 0600) Constitutional:  Alert and oriented, No acute distress Cardiovascular: Regular rate and rhythm Respiratory: Normal respiratory effort, Lungs clear bilaterally GI: Abdomen is soft, mildly tender, nondistended, no abdominal masses GU: No CVA tenderness, foley catheter in place with light pink urine Neurologic: Grossly intact, no focal deficits Psychiatric: Normal mood and affect  Laboratory Data:  Recent Labs    01/06/24 1215 01/07/24 0300 01/08/24 0300  WBC 18.0* 16.9* 12.5*  HGB 7.9* 8.1* 7.5*  HCT 24.6* 25.7* 23.5*  PLT 474* 466* 481*    Recent Labs    01/06/24 1215 01/07/24 0300 01/08/24 0300  NA 137 136 136  K 3.7 4.0 3.7  CL 101 101 102  GLUCOSE 90 87 88  BUN 27* 22 24*  CALCIUM  9.0  8.6* 8.6*  CREATININE 1.67* 1.38* 1.29*     Results for orders placed or performed during the hospital encounter of 01/06/24 (from the past 24 hours)  Body fluid culture w Gram Stain     Status: None (Preliminary result)   Collection Time: 01/07/24 12:29 PM   Specimen: Pelvis; Body Fluid  Result Value Ref Range   Specimen Description      PELVIS Performed at [facility], [address], [place], [place] [address]  Special Requests      NONE Performed at [facility], [address] [place], [place] [address]    Gram Stain      ABUNDANT WBC PRESENT, PREDOMINANTLY PMN NO ORGANISMS SEEN Performed at [facility], [address] [place], [place] [address]    Culture PENDING    Report Status PENDING   Creatinine, fluid (JP Drainage)     Status: None   Collection Time: 01/07/24  1:40 PM  Result Value Ref Range   Creat, Fluid 1.4 mg/dL   Fluid Type-FCRE JP DRAINAGE   Aerobic/Anaerobic Culture w Gram Stain (surgical/deep wound)     Status: None (Preliminary result)   Collection Time: 01/07/24  1:40 PM   Specimen: Pelvis; Peritoneal Fluid  Result Value Ref Range   Specimen Description      PELVIS Performed at [facility], [address], [place], [place] [address]    Special Requests      NONE Performed at [facility], [address], [place], [place] [address]    Gram Stain      MODERATE WBC PRESENT, PREDOMINANTLY PMN NO ORGANISMS SEEN Performed at [facility], [address] [place], [place] [address]    Culture PENDING    Report Status PENDING   CBC     Status: Abnormal   Collection Time: 01/08/24  3:00 AM  Result Value Ref Range   WBC 12.5 (H) 4.0 - 10.5 K/uL   RBC 2.49 (L) 4.22 - 5.81 MIL/uL   Hemoglobin 7.5 (L) 13.0 - 17.0 g/dL   HCT 76.4 (L) 60.9 - 47.9 %   MCV 94.4 80.0 - 100.0 fL   MCH 30.1 26.0 - 34.0 pg   MCHC 31.9 30.0 - 36.0 g/dL   RDW 86.7 88.4 - 84.4 %    Platelets 481 (H) 150 - 400 K/uL   nRBC 0.0 0.0 - 0.2 %  Basic metabolic panel with GFR     Status: Abnormal   Collection Time: 01/08/24  3:00 AM  Result Value Ref Range   Sodium 136 135 - 145 mmol/L   Potassium 3.7 3.5 - 5.1 mmol/L   Chloride 102 98 - 111 mmol/L   CO2 21 (L) 22 - 32 mmol/L   Glucose, Bld 88 70 - 99 mg/dL   BUN 24 (H) 8 - 23 mg/dL   Creatinine, Ser 8.70 (H) 0.61 - 1.24 mg/dL   Calcium  8.6 (L) 8.9 - 10.3 mg/dL   GFR, Estimated >39 >39 mL/min   Anion gap 13 5 - 15   Recent Results (from the past 240 hours)  Blood culture (routine x 2)     Status: None (Preliminary result)   Collection Time: 01/04/24 12:42 PM   Specimen: BLOOD  Result Value Ref Range Status   Specimen Description BLOOD RIGHT ANTECUBITAL  Final   Special Requests   Final    BOTTLES DRAWN AEROBIC AND ANAEROBIC Blood Culture adequate volume   Culture   Final    NO GROWTH 3 DAYS Performed at [facility], [address], [place], [place] [address]    Report Status PENDING  Incomplete  Blood culture (routine x 2)     Status: Abnormal   Collection Time: 01/04/24  1:00 PM   Specimen: BLOOD RIGHT HAND  Result Value Ref Range Status   Specimen Description BLOOD RIGHT HAND  Final   Special Requests   Final    BOTTLES DRAWN AEROBIC AND  ANAEROBIC Blood Culture adequate volume   Culture  Setup Time   Final    GRAM NEGATIVE RODS AEROBIC BOTTLE ONLY CRITICAL RESULT CALLED TO, READ BACK BY AND VERIFIED WITH: ED CHARGE RN OZELL CORDOBA [phone] AT 0959, ADC Performed at [facility], [address] [place], [place] [address]    Culture ESCHERICHIA COLI (A)  Final   Report Status 01/07/2024 FINAL  Final   Organism ID, Bacteria ESCHERICHIA COLI  Final   Organism ID, Bacteria ESCHERICHIA COLI  Final      Susceptibility   Escherichia coli - KIRBY BAUER*    CEFAZOLIN  SENSITIVE Sensitive    Escherichia coli - MIC*    AMPICILLIN <=2 SENSITIVE Sensitive     CEFEPIME <=0.12 SENSITIVE Sensitive      CEFTAZIDIME <=1 SENSITIVE Sensitive     CEFTRIAXONE  <=0.25 SENSITIVE Sensitive     CIPROFLOXACIN <=0.25 SENSITIVE Sensitive     GENTAMICIN <=1 SENSITIVE Sensitive     IMIPENEM <=0.25 SENSITIVE Sensitive     TRIMETH /SULFA  <=20 SENSITIVE Sensitive     AMPICILLIN/SULBACTAM <=2 SENSITIVE Sensitive     PIP/TAZO <=4 SENSITIVE Sensitive ug/mL    * ESCHERICHIA COLI    ESCHERICHIA COLI  Blood Culture ID Panel (Reflexed)     Status: Abnormal   Collection Time: 01/04/24  1:00 PM  Result Value Ref Range Status   Enterococcus faecalis NOT DETECTED NOT DETECTED Final   Enterococcus Faecium NOT DETECTED NOT DETECTED Final   Listeria monocytogenes NOT DETECTED NOT DETECTED Final   Staphylococcus species NOT DETECTED NOT DETECTED Final   Staphylococcus aureus (BCID) NOT DETECTED NOT DETECTED Final   Staphylococcus epidermidis NOT DETECTED NOT DETECTED Final   Staphylococcus lugdunensis NOT DETECTED NOT DETECTED Final   Streptococcus species NOT DETECTED NOT DETECTED Final   Streptococcus agalactiae NOT DETECTED NOT DETECTED Final   Streptococcus pneumoniae NOT DETECTED NOT DETECTED Final   Streptococcus pyogenes NOT DETECTED NOT DETECTED Final   A.calcoaceticus-baumannii NOT DETECTED NOT DETECTED Final   Bacteroides fragilis NOT DETECTED NOT DETECTED Final   Enterobacterales DETECTED (A) NOT DETECTED Final    Comment: Enterobacterales represent a large order of gram negative bacteria, not a single organism. CRITICAL RESULT CALLED TO, READ BACK BY AND VERIFIED WITH: ED CHARGE RN MICHAEL D. [phone] AT [phone], ADC    Enterobacter cloacae complex NOT DETECTED NOT DETECTED Final   Escherichia coli DETECTED (A) NOT DETECTED Final    Comment: CRITICAL RESULT CALLED TO, READ BACK BY AND VERIFIED WITH: ED CHARGE RN MICHAEL D. [phone] AT 0959, ADC    Klebsiella aerogenes NOT DETECTED NOT DETECTED Final   Klebsiella oxytoca NOT DETECTED NOT DETECTED Final   Klebsiella pneumoniae NOT DETECTED NOT DETECTED Final    Proteus species NOT DETECTED NOT DETECTED Final   Salmonella species NOT DETECTED NOT DETECTED Final   Serratia marcescens NOT DETECTED NOT DETECTED Final   Haemophilus influenzae NOT DETECTED NOT DETECTED Final   Neisseria meningitidis NOT DETECTED NOT DETECTED Final   Pseudomonas aeruginosa NOT DETECTED NOT DETECTED Final   Stenotrophomonas maltophilia NOT DETECTED NOT DETECTED Final   Candida albicans NOT DETECTED NOT DETECTED Final   Candida auris NOT DETECTED NOT DETECTED Final   Candida glabrata NOT DETECTED NOT DETECTED Final   Candida krusei NOT DETECTED NOT DETECTED Final   Candida parapsilosis NOT DETECTED NOT DETECTED Final   Candida tropicalis NOT DETECTED NOT DETECTED Final   Cryptococcus neoformans/gattii NOT DETECTED NOT DETECTED Final   CTX-M ESBL NOT DETECTED NOT  DETECTED Final   Carbapenem resistance IMP NOT DETECTED NOT DETECTED Final   Carbapenem resistance KPC NOT DETECTED NOT DETECTED Final   Carbapenem resistance NDM NOT DETECTED NOT DETECTED Final   Carbapenem resist OXA 48 LIKE NOT DETECTED NOT DETECTED Final   Carbapenem resistance VIM NOT DETECTED NOT DETECTED Final    Comment: Performed at [facility], [address], [place], [place] [address]  Blood culture (routine x 2)     Status: None (Preliminary result)   Collection Time: 01/06/24 12:15 PM   Specimen: BLOOD  Result Value Ref Range Status   Specimen Description BLOOD SITE NOT SPECIFIED  Final   Special Requests   Final    BOTTLES DRAWN AEROBIC AND ANAEROBIC Blood Culture adequate volume   Culture   Final    NO GROWTH < 24 HOURS Performed at [facility], [address] [place], [place] [address]    Report Status PENDING  Incomplete  Blood culture (routine x 2)     Status: None (Preliminary result)   Collection Time: 01/06/24 12:20 PM   Specimen: BLOOD LEFT ARM  Result Value Ref Range Status   Specimen Description BLOOD LEFT ARM  Final   Special Requests   Final    BOTTLES DRAWN  AEROBIC AND ANAEROBIC Blood Culture adequate volume   Culture   Final    NO GROWTH < 24 HOURS Performed at [facility], [address] [place], [place] [address]    Report Status PENDING  Incomplete  Culture, Urine (Do not remove urinary catheter, catheter placed by urology or difficult to place)     Status: Abnormal   Collection Time: 01/06/24  3:49 PM   Specimen: Urine, Catheterized  Result Value Ref Range Status   Specimen Description URINE, CATHETERIZED  Final   Special Requests NONE  Final   Culture (A)  Final    <10,000 COLONIES/mL INSIGNIFICANT GROWTH Performed at [facility], [address], [place], [place] [address]    Report Status 01/07/2024 FINAL  Final  Body fluid culture w Gram Stain     Status: None (Preliminary result)   Collection Time: 01/07/24 12:29 PM   Specimen: Pelvis; Body Fluid  Result Value Ref Range Status   Specimen Description   Final    PELVIS Performed at [facility], [address] [place], [place] [address]    Special Requests   Final    NONE Performed at [facility], [address] [place], [place] [address]    Gram Stain   Final    ABUNDANT WBC PRESENT, PREDOMINANTLY PMN NO ORGANISMS SEEN Performed at [facility], [address] [place], [place] [address]    Culture PENDING  Incomplete   Report Status PENDING  Incomplete  Aerobic/Anaerobic Culture w Gram Stain (surgical/deep wound)     Status: None (Preliminary result)   Collection Time: 01/07/24  1:40 PM   Specimen: Pelvis; Peritoneal Fluid  Result Value Ref Range Status   Specimen Description   Final    PELVIS Performed at [facility], [address], [place], [place] [address]    Special Requests   Final    NONE Performed at [facility], [address], [place], [place] [address]    Gram Stain   Final    MODERATE WBC PRESENT, PREDOMINANTLY PMN NO ORGANISMS SEEN Performed at  [facility], [address], [place], [place]  [address]    Culture PENDING  Incomplete   Report Status PENDING  Incomplete    Renal Function: Recent Labs    01/04/24 0952 01/04/24 0953 01/06/24 1215 01/07/24 0300 01/08/24 0300  CREATININE 1.54* 1.50* 1.67* 1.38* 1.29*   Estimated Creatinine Clearance: 57.4 mL/min (A) (by C-G formula based on SCr of 1.29 mg/dL (H)).  Radiologic Imaging: CT GUIDED PERITONEAL/RETROPERITONEAL FLUID DRAIN BY PERC CATH Result Date: 01/07/2024 INDICATION: 63 year old with history of prostatectomy and pelvic fluid collection. Concern for a urinary leak. EXAM: CT GUIDED DRAIN PLACEMENT IN PELVIC FLUID COLLECTION TECHNIQUE: Multidetector CT imaging of the pelvis was performed following the standard protocol with and without contrast IV contrast. RADIATION DOSE REDUCTION: This exam was performed according to the departmental dose-optimization program which includes automated exposure control, adjustment of the mA and/or kV according to patient size and/or use of iterative reconstruction technique. CONTRAST:  50 mL Omnipaque  300 MEDICATIONS: Moderate sedation ANESTHESIA/SEDATION: Moderate (conscious) sedation was employed during this procedure. A total of Versed  4 mg and Fentanyl  200 mcg was administered intravenously by the radiology nurse. Total intra-service moderate Sedation Time: 50 minutes. The patient's level of consciousness and vital signs were monitored continuously by radiology nursing throughout the procedure under my direct supervision. COMPLICATIONS: None immediate. PROCEDURE: Informed written consent was obtained from the patient after a thorough discussion of the procedural risks, benefits and alternatives. All questions were addressed. A timeout was performed prior to the initiation of the procedure. Patient was placed supine on CT scanner. CT images through the pelvis were obtained without contrast. Additional CT images were obtained after the  administration intravenous contrast to better characterize the fluid collections. Patient was placed prone and additional CT images were obtained. Urinary leak was identified at this time. Findings discussed with Dr. Selma in urology. We decided to proceed with a transgluteal pelvic drain placement. The left buttock was prepped with chlorhexidine  and sterile field was created. Maximal barrier sterile technique was utilized including caps, mask, sterile gowns, sterile gloves, sterile drape, hand hygiene and skin antiseptic. Skin was anesthetized with 1% lidocaine . Small incision was made. Using CT guidance, an 18 gauge trocar needle was directed into the left posterior fluid collection from a transgluteal approach. Yellow fluid was aspirated. Superstiff Amplatz wire was placed. The tract was dilated to accommodate a 10 [place] multipurpose drain. Additional yellow fluid was obtained. Fluid was sent for culture and creatinine level. Drain was attached to a suction bulb and sutured to the skin. Dressing was placed. FINDINGS: Initial set of images demonstrated a complex fluid collection in the pelvis surrounding the rectum. In addition, there appeared to be a new fluid collection in the right lower quadrant extending to the pelvis. Subsequent images were obtained with the patient prone and there was contrast extravasating in the pelvis. There appears to be a leak on image 47, sequence number 7. This leak is near the base of the bladder along the left posterior aspect. There is contrast extravasating anterior and posterior to the bladder. Drain placed from a left transgluteal approach. Drain is situated within the left component of the pelvic fluid collection. Yellow fluid was aspirated from the drain. IMPRESSION: 1. Positive for a urinary leak. Leak is located along the left posterior aspect of the urethra near the bladder base. 2. CT-guided placement of a left transgluteal pelvic drain. Electronically Signed   By: Juliene Balder M.D.   On: 01/07/2024 19:53   CT CYSTOGRAM PELVIS Result Date: 01/07/2024 CLINICAL DATA:  History  of recent prostatectomy with recurrent pelvic fluid collection which has been drained. Bladder leak was noted during the drainage catheter placement. EXAM: CT CYSTOGRAM (CT ABDOMEN AND PELVIS WITH CONTRAST) TECHNIQUE: Multi-detector CT imaging through the abdomen and pelvis was performed after dilute contrast had been introduced into the bladder for the purposes of performing CT cystography. RADIATION DOSE REDUCTION: This exam was performed according to the departmental dose-optimization program which includes automated exposure control, adjustment of the mA and/or kV according to patient size and/or use of iterative reconstruction technique. CONTRAST:  OMNIPAQUE  IOHEXOL  300 MG/ML  SOLN COMPARISON:  Drainage procedure from earlier in the same day as well as CT from the previous day. FINDINGS: Lower chest: Mild atelectatic changes are noted in the bases bilaterally. Hepatobiliary: No focal liver abnormality is seen. No gallstones, gallbladder wall thickening, or biliary dilatation. Vicarious excretion of contrast is noted within the gallbladder. Pancreas: Unremarkable. No pancreatic ductal dilatation or surrounding inflammatory changes. Spleen: Normal in size without focal abnormality. Adrenals/Urinary Tract: Adrenal glands are within normal limits. Kidneys demonstrate a normal enhancement pattern bilaterally. Normal excretion is seen. The bladder is distended with contrast enhanced urine related to retrograde placement via the Foley catheter as well as recently injected IV contrast. No obstructive changes are seen. The bladder is well distended and there is a large bladder wall defect noted at the bladder neck inferiorly and posteriorly on the left similar to that seen on the recent CT intervention. Apparent extension into the proximal urethra is noted. Considerable extravasation of contrast is noted around  the base of the bladder extraperitoneally. There does not appear to be any passage of contrast material into the recently drained fluid collection which appears decreased in size when compared with the prior exam. Additionally no significant passage of contrast into the peritoneal space is noted. Stomach/Bowel: No obstructive or inflammatory changes of the colon are seen. Mild small bowel dilatation is noted which appears increased when compare with the prior exam which may represent a degree of postoperative ileus reactive to the free fluid in the pelvis and peritoneal cavity. Stomach is well distended with fluid. The appendix is not well appreciated. Vascular/Lymphatic: Aortic atherosclerosis. No enlarged abdominal or pelvic lymph nodes. Reproductive: Prostate has been surgically removed. Other: Multiple fluid collections are identified within the abdomen. One lies along the inferior margin of the liver interposed between the liver and the right kidney which measures approximately 4.2 x 2.6 cm best seen on image number 17 of series 7. A second larger collection is noted in the right hemiabdomen which is somewhat lunate in shape measuring approximately 9.0 x 3.0 cm best seen on image number 30 of series 7. Additional right-sided collection is noted inferiorly best seen on image number 45 of series 7 measuring up to 8.3 cm. The known recently drained pelvic collection has decreased significantly in size. Musculoskeletal: No acute bony abnormality is noted. IMPRESSION: Large defect in the bladder neck and proximal urethra posteriorly on the left similar to that seen on the prior interventional CT images. The defect measures approximately 13 mm on the coronal images and up to 2 cm posteriorly on the sagittal delayed images. On the axial images it measures approximately 10 mm in AP dimension. Extraperitoneal extravasation of contrast is noted. No intraperitoneal involvement is seen. Increase in size and number of  additional fluid collections within the peritoneum as described. These are new when compared with the exam from the previous day. Mild small bowel dilatation is noted which may  be related to a degree of ileus reactive to the fluid changes within the abdomen. Correlation with physical exam is recommended. Interval placement of drainage catheter in the left inferior pelvis with significant decompression of the previously seen fluid collection. Electronically Signed   By: Oneil Devonshire M.D.   On: 01/07/2024 19:23   CT HEMATURIA WORKUP Result Date: 01/07/2024 CLINICAL DATA:  Hematuria. EXAM: CT ABDOMEN AND PELVIS WITHOUT AND WITH CONTRAST TECHNIQUE: Multidetector CT imaging of the abdomen and pelvis was performed following the standard protocol before and following the bolus administration of intravenous contrast. RADIATION DOSE REDUCTION: This exam was performed according to the departmental dose-optimization program which includes automated exposure control, adjustment of the mA and/or kV according to patient size and/or use of iterative reconstruction technique. CONTRAST:  OMNIPAQUE  IOHEXOL  300 MG/ML  SOLN COMPARISON:  CT abdomen and pelvis 01/06/2024 FINDINGS: Lower chest: There is mild atelectasis in the lung bases. Hepatobiliary: There is some excreted contrast in the gallbladder. Otherwise, liver, gallbladder and bile ducts are within normal limits. Pancreas: Unremarkable. No pancreatic ductal dilatation or surrounding inflammatory changes. Spleen: Normal in size without focal abnormality. Adrenals/Urinary Tract: There is a rounded hypodensity in the inferior pole the right kidney most compatible with cysts measuring 1 cm. No other focal renal lesions are identified. Kidneys are normal in size and contour. There is no perinephric fluid or stranding. There is a small amount of residual contrast seen in the kidneys on precontrast imaging which can be seen with acute renal function disorder. There is no  hydronephrosis. The bilateral ureters are opacified to the level of the pelvic inlet and appear within normal limits. The distal ureters are not well opacified. There is a Foley catheter in the bladder. The bladder is distended with contrast. There is bladder rupture with leakage of contrast from the inferior left bladder wall extending into extraperitoneal spaces inferiorly and anteriorly bilaterally. Stomach/Bowel: There are numerous dilated small bowel loops measuring up to 4.2 cm with air-fluid levels. These have increased in size when compared to prior. No definitive transition point visualized. The colon appears nondilated. There is a moderate air-fluid level in the stomach. Vascular/Lymphatic: The aorta and IVC are normal in size. There are atherosclerotic calcifications of the aorta. No enlarged lymph nodes are identified. Reproductive: Prostate gland is not well defined. Other: There is a new posterior percutaneous catheter with distal tip in the upper left pelvis. Previously identified enhancing fluid collection in the posterior pelvis has slightly decreased in size but now contains air. This collection measures 6.5 x 4.5 cm (previously 9.5 x 7.3 cm on similar image. There is new enhancing fluid collection in the paracolic gutter abutting multiple small bowel loops. This collection measures approximately 8.0 x 3.7 by 10.0 cm. There is also a new small amount of fluid between the inferior margin of the liver and right kidney without enhancement. This fluid measures 1.7 x 3.6 by 7.5 cm. Musculoskeletal: No fracture is seen. IMPRESSION: 1. Extraperitoneal bladder rupture with leakage of contrast from the inferior left bladder wall extending into extraperitoneal spaces inferiorly and anteriorly bilaterally. 2. New posterior percutaneous catheter with distal tip in the upper left pelvis. 3. Previously identified enhancing fluid collection in the posterior pelvis has slightly decreased in size but now contains  air. 4. New enhancing fluid collection in the right paracolic gutter abutting multiple small bowel loops measuring 8.0 x 3.7 x 10.0 cm. 5. New small amount of fluid between the inferior margin of the liver and  right kidney without enhancement. 6. New small bilateral pleural effusions. 7. New small amount of residual contrast in the kidneys on precontrast imaging which can be seen with acute renal injury. 8. Increasing size of small bowel loops with air-fluid levels. No definitive transition point visualized. Findings may be related to ileus or partial small bowel obstruction. 9. Aortic atherosclerosis. Aortic Atherosclerosis (ICD10-I70.0). Electronically Signed   By: Greig Pique M.D.   On: 01/07/2024 19:15   CT ABDOMEN PELVIS W CONTRAST Result Date: 01/06/2024 CLINICAL DATA:  Abdominal pain, post-op. * Tracking Code: BO * EXAM: CT ABDOMEN AND PELVIS WITH CONTRAST TECHNIQUE: Multidetector CT imaging of the abdomen and pelvis was performed using the standard protocol following bolus administration of intravenous contrast. RADIATION DOSE REDUCTION: This exam was performed according to the departmental dose-optimization program which includes automated exposure control, adjustment of the mA and/or kV according to patient size and/or use of iterative reconstruction technique. CONTRAST:  75mL OMNIPAQUE  IOHEXOL  350 MG/ML SOLN COMPARISON:  CT scan abdomen and pelvis from 01/04/2024. FINDINGS: Lower chest: There are subpleural atelectatic changes in the visualized lung bases. No overt consolidation. No pleural effusion. The heart is normal in size. No pericardial effusion. Hepatobiliary: The liver is normal in size. Non-cirrhotic configuration. No suspicious mass. No intrahepatic or extrahepatic bile duct dilation. There are new hyperattenuating areas in the dependent portion of the gallbladder, which may represent vicarious excretion of previously administered intravenous contrast. Normal gallbladder wall thickness. No  pericholecystic inflammatory changes. Pancreas: Unremarkable. No pancreatic ductal dilatation or surrounding inflammatory changes. Spleen: Within normal limits. No focal lesion. Adrenals/Urinary Tract: Adrenal glands are unremarkable. No suspicious renal mass. No hydronephrosis. No renal or ureteric calculi. Urinary bladder is decompressed secondary to Foley catheter, which appears in satisfactory position Stomach/Bowel: No disproportionate dilation of the small or large bowel loops. Appendix is not distinctly visualized. Vascular/Lymphatic: Redemonstration of fluid collection in the dependent pelvis currently measuring up to 7.3 x 9.6 cm, increased since the prior study. The collection exhibits thin hyperattenuating incomplete walls. Findings again may represent postoperative seroma versus developing abscess. There is also redemonstration of fluid and small amount of nondependent air in the space of Retzius, surrounding the urinary bladder as well as along the bilateral pelvic sidewalls, which is likely secondary to false passage/malposition of the Foley bulb seen on the prior exam. No abdominal or pelvic lymphadenopathy, by size criteria. No aneurysmal dilation of the major abdominal arteries. There are moderate peripheral atherosclerotic vascular calcifications of the aorta and its major branches. Reproductive: There is history of prostate resection. Other: Supraumbilical midline surgical scar noted. Musculoskeletal: No suspicious osseous lesions. There are mild multilevel degenerative changes in the visualized spine. IMPRESSION: 1. Redemonstration of fluid collection in the dependent pelvis, which has increased since the prior study. The collection exhibits thin hyperattenuating incomplete walls. Findings again may represent postoperative seroma versus developing abscess. 2. There is also redemonstration of fluid and small amount of nondependent air in the Space of Retzius, surrounding the urinary bladder as  well as along the bilateral pelvic sidewalls, which is likely secondary to false passage/malposition of the Foley bulb seen on the prior exam. 3. Multiple other nonacute observations, as described above. Aortic Atherosclerosis (ICD10-I70.0). Electronically Signed   By: Ree Molt M.D.   On: 01/06/2024 15:03    I independently reviewed the above imaging studies.  Impression/Recommendation Prostate cancer s/p robotic assisted laparoscopic radical prostatectomy and bilateral lymph node dissection on 12/26/2023 with [id number] GG2 disease Pelvic fluid collection Possible urine  leak Bacteremia  -Appreciate medicine assistance. Continue IV abx. Repeat blood cultures pending -Keep foley catheter to gravity drainage. Again discussed with patient likelihood of urine leak following dislodged foley catheter.  -OR today, plan for repair of bladder defect   Jacqulyn Bound, MD PGY4 Urology 01/08/2024, 6:27 AM  [facility]  Pager: [phone]   Denies abdominal pain. No N/V. Passing flatus, having loose Bms. Tolerated diet yesterday.  Abd soft, ND, inc c/di UOP: 1.3L clear yellow JP: 40ml serous  WBC downtrending at 12 Hgb stable at 7.5 Cr 1.29 - downtrending  -Prostate cancer s/p robotic assisted laparoscopic radical prostatectomy and bilateral lymph node dissection on 12/26/2023 with [id number] GG2 disease -Pelvic fluid collection s/p IR drain with JP seroequivalent on 8/9 -Urine leak with defect in posterior vesicourethral anastomosis on cystogram 8/9 -Bacteremia  -Ucx 8/8: <10K insignificant growth -Blood cx 8/8 no growth -Continue ceftiraxone -To OR today for cysto, b/l RPG, b/l externalized stent placement, possible robotic vs open repair of vesicourethral anastomotic leak. Discussed risks and benefits including infection, bleeding, injury to surrounding structures and anesthesia risks of arrhythmia, MI, DVT, CVA.  -Discussed plan with wife at bedside  Matt R. Kellsey Sansone MD [facility]  Pager: [phone]

## 2024-01-08 NOTE — Op Note (Signed)
 Operative Note  Preoperative diagnosis:  1.  Vesicourethral anastomotic leak  Postoperative diagnosis: 1.  Same  Procedure(s): 1.  Cystoscopy 2.  Bilateral retrograde pyelogram with interpretation 3.  Bilateral ureteral stent placement 4. Fluoroscopy <1 hour with intraoperative interpretation 5.  Lysis of adhesions 6.  Robotic-assisted laparoscopic vesicourethral anastomotic repair  Surgeon: Donnice Siad, MD  Assistants: Noretta Ferrara, MD  Resident: Jacqulyn Bound, PGY-4  An assistant was required for this surgical procedure.  The duties of the assistant included but were not limited to suctioning, passing suture, camera manipulation, retraction.  This procedure would not be able to be performed without an Geophysicist/field seismologist.   Anesthesia:  General  Complications:  None  EBL:  Minmal  Specimens: 1. None  Drains/Catheters: 1.  6Fr x 26cm right ureteral stent 2. 6Fr x 26cm left ureteral stent 3.  18 French Foley catheter with 15 mL instilled into a 30 mL capacity balloon 4.  15 [place] JP drain  Intraoperative findings:   Cystoscopy demonstrated approximately 2 x 3 cm defect at the left posterior anastomosis.  There were no suspicious lesions, masses or stones within the bladder. Left retrograde pyelogram demonstrated no extravasation of contrast, no filling defects, and no hydronephrosis. Right retrograde pyelogram demonstrated no extravasation of contrast, no filling defects and no hydronephrosis. Ureteral orifices were close to the anastomosis however not involving the anastomosis. Bilateral double-J stents were placed in satisfactory position Significant intra-abdominal adhesions were taken down with great care. Right paracolic gutter fluid collection was drained. Excellent repair of anastomosis with combination of 3 oh V-Loc and 3-0 Monocryl sutures.  Likely tiny posterior leak however significantly improved.  Indication:  [name] is a 63 y.o. male with history of  prostate cancer who underwent robotic assisted laparoscopic radical prostatectomy bilateral pelvic lymph dissection on 12/26/2023.  Unfortunately, postoperatively he was found to have AKI and poorly draining Foley and catheter was advanced with initial excellent urine output.  Over the following week, he had difficulty with catheter drainage and abdominal pain.  Ultimately, cystogram revealed posterior leak.  He is being brought to the operating today for cystoscopy, bilateral ureteral stent placements, possible robotic anastomotic repair.  Description of procedure: The patient was taken to the operating room and general anesthesia was induced.  The patient was placed in the dorsal lithotomy position, prepped and draped in the usual sterile fashion, and preoperative antibiotics were administered. A preoperative time-out was performed.   Cystourethroscopy was performed.  The patient's urethra was examined and was normal anteriorly.  At the level the bladder neck, there is approximately 3 x 2 cm defect at the posterior left side.  This filled into a left paracolic gutter fluid collection.  I was able to visualize bilateral ureteral orifices.   Attention then turned to the left ureteral orifice. A 0.038 zip wire was passed through the left orifice and over the wire a 5 Fr open ended catheter was inserted.  Wire was removed and left retrograde pyelogram was performed revealing no extravasation of contrast, no filling defects and no hydronephrosis.  Wire was replaced and a 6Fr x 26cm ureteral stent was advance over the wire. The stent was positioned appropriately under fluoroscopic and cystoscopic guidance.  The wire was then removed with an adequate stent curl noted in the renal pelvis as well as in the bladder.  Attention then turned to the right ureteral orifice. A 0.038 zip wire was passed through the right orifice and over the wire a 5 Fr open ended catheter  was inserted.  Wire was removed and retrograde  pyelogram was performed demonstrating no hydronephrosis, no filling defects, no extravasation of contrast. A 6Fr x 26cm ureteral stent was advance over the wire. The stent was positioned appropriately under fluoroscopic and cystoscopic guidance.  The wire was then removed with an adequate stent curl noted in the renal pelvis as well as in the bladder.  A 0.038 sensor wire was passed into the bladder and over this wire, an 18 French Foley catheter was advanced in the bladder.  Given the significant defect, we elected proceed with robotic assisted laparoscopic repair.  Patient was then reprepped and draped. The patient was repositioned in the dorsal lithotomy position using well-padded Allen stirrups.  The arms were carefully tucked at the patient's side and secured with padding.  The chest was secured in place with foam padding and cloth tape and the table was positioned in approximately 30 degree Trendelenburg. The patient's abdomen, genitalia, and upper thighs were prepped and draped in the standard sterile manner.  A time was completed, verifying the correct patient, surgical procedure and positioning prior to beginning the procedure.    We began by opening the supraumbilical incision and dissecting into the fascia after cutting the prior PDS sutures.  We then placed an open Hassan port and secured this with a 0 Vicryl suture.  We then introduced a laparoscopic camera and visualize the abdomen noting no significant injury.  There was some fluid present as well as significant adhesions.  These were taken down with great care ensuring no injury to the bowel.  We were able to free up the adhesions enough to place our other ports.  The remainder of the trochars were placed which included 2 separate 8 millimeter robotic trochars which were placed 9 cm laterally and inferiorly to the initially placed camera trocar.  A 12 mm trocar was placed 8 mm lateral to the right robotic trocar.  A separate 8 mm robotic trocar  was placed 8 cm lateral to the previously placed left robotic trocar on the left side.  The robot was then docked.  I placed monopolar scissors in the right hand, a fenestrated bipolar in the left hand and a prograsp in the fourth arm.  I then completed taking down the adhesions and then developing the space of Retzius as the bladder was already adhesed to the pubic bone anteriorly.  The space was bluntly dissected until we encountered the anastomosis.  Of note, this was quite tedious as there were significant fluid loculations and adhesions and poor tissue quality.  Of note, the anastomosis was intact anteriorly and on the patient's right side.  There was a fluid collection in the left paracolic gutter.  Visible was a Foley catheter balloon in the space at the level of the defect.  I elected to take down the anterior anastomosis on a more visualize the posterior defect.  The left side the bladder was further mobilized ensuring that we could complete anastomosis without tension.  I initially used a 3 oh V-Loc suture and ran the posterior layer up to the level of the ureteral orifice.  At this point, I then used a 3-0 Monocryl suture and tied this to the previous V-Loc suture and continued running up the left side of the bladder until we achieved the corner of the tear.  This was then further tied down and secured.  I then ran the anterior anastomosis with a separate 3 oh V-Loc suture.  An 18 [place] Foley  catheter with a 30 mL capacity balloon was placed into the bladder and the balloon was inflated with 15 mL of sterile water .  We performed a leak test and it was likely a very small minimal posterior leak.  We left a JP drain and this is placed in the left paracolic gutter.   A 15 French drain was placed through the previously placed fourth arm.  We did place a Carter-Thomason 0 vicryl suture through the 12 mm assistant port. The robot was undocked and all the trochars were removed under direct vision.     Exparel  was injected to the trocar sites.  The skin was closed with 4-0 Monocryl in a running subcuticular fashion.  Skin glue was applied.  At this point, the patient was extubated and awakened in the operating room and taken to recovery room in stable condition.  There were no immediate complications.  All counts were correct.  Plan: Admit for observation overnight.  Clear liquids tonight.  Regular for breakfast tomorrow.    Matt R. Caydee Talkington MD [facility]  Pager: [phone]

## 2024-01-08 NOTE — Anesthesia Postprocedure Evaluation (Signed)
 Anesthesia Post Note  Patient: [name]  Procedure(s) Performed: CYSTOSCOPY, WITH RETROGRADE PYELOGRAM AND URETERAL STENT INSERTION (Bilateral: Bladder) PROSTATECTOMY, RADICAL, ROBOT-ASSISTED, LAPAROSCOPIC     Patient location during evaluation: PACU Anesthesia Type: General Level of consciousness: awake Pain management: pain level controlled Vital Signs Assessment: post-procedure vital signs reviewed and stable Respiratory status: spontaneous breathing, nonlabored ventilation and respiratory function stable Cardiovascular status: blood pressure returned to baseline and stable Postop Assessment: no apparent nausea or vomiting Anesthetic complications: no   No notable events documented.  Last Vitals:  Vitals:   01/08/24 1346 01/08/24 1349  BP:  (!) 161/86  Pulse:    Resp:  11  Temp: 36.8 C   SpO2:  96%    Last Pain:  Vitals:   01/08/24 1300  TempSrc:   PainSc: Asleep                 [name]

## 2024-01-08 NOTE — Transfer of Care (Signed)
 Immediate Anesthesia Transfer of Care Note  Patient: [name]  Procedure(s) Performed: CYSTOSCOPY, WITH RETROGRADE PYELOGRAM AND URETERAL STENT INSERTION (Bilateral: Bladder) PROSTATECTOMY, RADICAL, ROBOT-ASSISTED, LAPAROSCOPIC  Patient Location: PACU  Anesthesia Type:General  Level of Consciousness: drowsy and patient cooperative  Airway & Oxygen Therapy: Patient Spontanous Breathing  Post-op Assessment: Report given to RN and Post -op Vital signs reviewed and stable  Post vital signs: Reviewed and stable  Last Vitals:  Vitals Value Taken Time  BP 154/85 01/08/24 12:30  Temp 36.5 C 01/08/24 12:30  Pulse 80 01/08/24 12:33  Resp 16 01/08/24 12:33  SpO2 100 % 01/08/24 12:33  Vitals shown include unfiled device data.  Last Pain:  Vitals:   01/08/24 1230  TempSrc:   PainSc: Asleep         Complications: No notable events documented.

## 2024-01-08 NOTE — Anesthesia Procedure Notes (Signed)
 Procedure Name: Intubation Date/Time: 01/08/2024 8:22 AM  Performed by: Claudene Hoy CROME, CRNAPre-anesthesia Checklist: Patient identified, Emergency Drugs available, Suction available and Patient being monitored Patient Re-evaluated:Patient Re-evaluated prior to induction Oxygen Delivery Method: Circle System Utilized Preoxygenation: Pre-oxygenation with 100% oxygen Induction Type: IV induction Ventilation: Mask ventilation without difficulty Laryngoscope Size: Mac and 4 Grade View: Grade I Tube type: Oral Tube size: 7.5 mm Number of attempts: 1 Airway Equipment and Method: Stylet Placement Confirmation: ETT inserted through vocal cords under direct vision, positive ETCO2 and breath sounds checked- equal and bilateral Secured at: 23 cm Tube secured with: Tape Dental Injury: Teeth and Oropharynx as per pre-operative assessment

## 2024-01-09 ENCOUNTER — Encounter (HOSPITAL_COMMUNITY): Payer: Self-pay | Admitting: Urology

## 2024-01-09 LAB — BASIC METABOLIC PANEL WITH GFR
Anion gap: 14 (ref 5–15)
BUN: 24 mg/dL — ABNORMAL HIGH (ref 8–23)
CO2: 22 mmol/L (ref 22–32)
Calcium: 8.6 mg/dL — ABNORMAL LOW (ref 8.9–10.3)
Chloride: 104 mmol/L (ref 98–111)
Creatinine, Ser: 1.19 mg/dL (ref 0.61–1.24)
GFR, Estimated: >60 mL/min (ref >60)
Glucose, Bld: 97 mg/dL (ref 70–99)
Potassium: 3.8 mmol/L (ref 3.5–5.1)
Sodium: 140 mmol/L (ref 135–145)

## 2024-01-09 LAB — CBC
HCT: 24.3 % — ABNORMAL LOW (ref 39.0–52.0)
Hemoglobin: 7.7 g/dL — ABNORMAL LOW (ref 13.0–17.0)
MCH: 29.2 pg (ref 26.0–34.0)
MCHC: 31.7 g/dL (ref 30.0–36.0)
MCV: 92 fL (ref 80.0–100.0)
Platelets: 546 K/uL — ABNORMAL HIGH (ref 150–400)
RBC: 2.64 MIL/uL — ABNORMAL LOW (ref 4.22–5.81)
RDW: 13.1 % (ref 11.5–15.5)
WBC: 12.7 K/uL — ABNORMAL HIGH (ref 4.0–10.5)
nRBC: 0 % (ref 0.0–0.2)

## 2024-01-09 LAB — CULTURE, BLOOD (ROUTINE X 2)
Culture: NO GROWTH
Special Requests: ADEQUATE

## 2024-01-09 MED ORDER — DOCUSATE SODIUM 50 MG PO CAPS
50.0000 mg | ORAL_CAPSULE | Freq: Once | ORAL | Status: AC | PRN
Start: 2024-01-09 — End: 2024-01-09
  Administered 2024-01-09 (×2): 50 mg via ORAL
  Filled 2024-01-09: qty 1

## 2024-01-09 NOTE — TOC Initial Note (Signed)
 Transition of Care [facility]) - Initial/Assessment Note    Patient Details  Name: [name] MRN: [id number] Date of Birth: Jul 11, 1960  Transition of Care [facility]) CM/SW Contact:    [name] [name], RN Phone Number: 01/09/2024, 12:57 PM  Clinical Narrative:                 Pt is from home with spouse. Pt has PCP listed but no insurance on file, Medicaid Potential. Pt inpatient with continued medical work-up, not medically stable for discharge. Urology following. Care Management following for any new recommendations or DC needs.    Expected Discharge Plan: Home/Self Care Barriers to Discharge: Continued Medical Work up   Patient Goals and CMS Choice Patient states their goals for this hospitalization and ongoing recovery are:: To return home CMS Medicare.gov Compare Post Acute Care list provided to::  (NA) Choice offered to / list presented to : NA [place] ownership interest in [facility].provided to:: Parent NA    Expected Discharge Plan and Services In-house Referral: NA Discharge Planning Services: CM Consult Post Acute Care Choice: NA Living arrangements for the past 2 months: Single Family Home                 DME Arranged: N/A DME Agency: NA       HH Arranged: NA HH Agency: NA        Prior Living Arrangements/Services Living arrangements for the past 2 months: Single Family Home Lives with:: Spouse Patient language and need for interpreter reviewed:: Yes Do you feel safe going back to the place where you live?: Yes      Need for Family Participation in Patient Care: No (Comment) Care giver support system in place?: Yes (comment) Current home services: Other (comment) (NA) Criminal Activity/Legal Involvement Pertinent to Current Situation/Hospitalization: No - Comment as needed  Activities of Daily Living   ADL Screening (condition at time of admission) Independently performs ADLs?: Yes (appropriate for developmental age) Is the patient deaf  or have difficulty hearing?: No Does the patient have difficulty seeing, even when wearing glasses/contacts?: No Does the patient have difficulty concentrating, remembering, or making decisions?: No  Permission Sought/Granted Permission sought to share information with : Family Supports Permission granted to share information with : Yes, Verbal Permission Granted  Share Information with NAME: [name] (Spouse)  [phone]           Emotional Assessment Appearance:: Appears stated age     Orientation: : Oriented to Self, Oriented to Place, Oriented to  Time, Oriented to Situation Alcohol / Substance Use: Not Applicable Psych Involvement: No (comment)  Admission diagnosis:  Bacteremia [R78.81] Sepsis secondary to UTI (HCC) [A41.9, N39.0] Patient Active Problem List   Diagnosis Date Noted   Sepsis secondary to UTI (HCC) 01/06/2024   Acute kidney injury (HCC) 12/29/2023   Foot callus 10/27/2023   Onychomycosis 10/27/2023   Palpitations 08/25/2022   Prostate cancer (HCC) 03/06/2019   Family history of colon cancer 10/19/2018   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   Spinal stenosis at L4-L5 level 09/22/2016   AV block    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   Depression 11/19/2015   Primary osteoarthritis of first carpometacarpal joint of left hand 10/23/2015   Chronic renal insufficiency, stage II (mild) 07/02/2014   Coronary artery disease involving native coronary artery of native heart with angina pectoris (HCC)    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/2013   Migraine headache 03/26/2013   GERD (gastroesophageal reflux disease) 11/14/2012  Routine general medical examination at a health care facility 07/28/2012   ERECTILE DYSFUNCTION 08/12/2010   Hyperlipidemia with target LDL less than 70 10/15/2009   TOBACCO USE 10/15/2009   Essential hypertension 10/15/2009   MYOCARDIAL INFARCTION, HX OF 10/15/2009   PCP:  Delbert Clam,  MD Pharmacy:   [id number] [address], [place] - [address] AT [facility] [address] [place] [address] Phone: [phone] Fax: [phone]     Social Drivers of Health (SDOH) Social History: SDOH Screenings   Food Insecurity: No Food Insecurity (01/06/2024)  Recent Concern: Food Insecurity - Food Insecurity Present (12/26/2023)  Housing: Low Risk  (01/06/2024)  Transportation Needs: No Transportation Needs (01/06/2024)  Utilities: Not At Risk (01/06/2024)  Alcohol Screen: Low Risk  (03/09/2023)  Depression (PHQ2-9): Low Risk  (11/08/2023)  Recent Concern: Depression (PHQ2-9) - Medium Risk (10/27/2023)  Financial Resource Strain: Medium Risk (03/09/2023)  Physical Activity: Inactive (03/09/2023)  Social Connections: Moderately Isolated (12/26/2023)  Stress: Stress Concern Present (03/09/2023)  Tobacco Use: Medium Risk (01/06/2024)  Health Literacy: Adequate Health Literacy (03/09/2023)   SDOH Interventions: Food Insecurity Interventions: Intervention Not Indicated, Inpatient TOC   Readmission Risk Interventions    01/09/2024   12:50 PM  Readmission Risk Prevention Plan  Transportation Screening Complete  PCP or Specialist Appt within 5-7 Days Complete  Home Care Screening Complete  Medication Review (RN CM) Complete

## 2024-01-09 NOTE — TOC Initial Note (Signed)
 Transition of Care [facility]) - Initial/Assessment Note    Patient Details  Name: [name] MRN: [id number] Date of Birth: Jul 11, 1960  Transition of Care [facility]) CM/SW Contact:    Jon [name], RN Phone Number: 01/09/2024, 12:57 PM  Clinical Narrative:                 Pt is from home with spouse. Pt has PCP listed but no insurance on file, Medicaid Potential. Pt inpatient with continued medical work-up, not medically stable for discharge. Urology following. Care Management following for any new recommendations or DC needs.    Expected Discharge Plan: Home/Self Care Barriers to Discharge: Continued Medical Work up   Patient Goals and CMS Choice Patient states their goals for this hospitalization and ongoing recovery are:: To return home CMS Medicare.gov Compare Post Acute Care list provided to::  (NA) Choice offered to / list presented to : NA [place] ownership interest in [facility].provided to:: Parent NA    Expected Discharge Plan and Services In-house Referral: NA Discharge Planning Services: CM Consult Post Acute Care Choice: NA Living arrangements for the past 2 months: Single Family Home                 DME Arranged: N/A DME Agency: NA       HH Arranged: NA HH Agency: NA        Prior Living Arrangements/Services Living arrangements for the past 2 months: Single Family Home Lives with:: Spouse Patient language and need for interpreter reviewed:: Yes Do you feel safe going back to the place where you live?: Yes      Need for Family Participation in Patient Care: No (Comment) Care giver support system in place?: Yes (comment) Current home services: Other (comment) (NA) Criminal Activity/Legal Involvement Pertinent to Current Situation/Hospitalization: No - Comment as needed  Activities of Daily Living   ADL Screening (condition at time of admission) Independently performs ADLs?: Yes (appropriate for developmental age) Is the patient deaf  or have difficulty hearing?: No Does the patient have difficulty seeing, even when wearing glasses/contacts?: No Does the patient have difficulty concentrating, remembering, or making decisions?: No  Permission Sought/Granted Permission sought to share information with : Family Supports Permission granted to share information with : Yes, Verbal Permission Granted  Share Information with NAME: [name], [name] (Spouse)  [phone]           Emotional Assessment Appearance:: Appears stated age     Orientation: : Oriented to Self, Oriented to Place, Oriented to  Time, Oriented to Situation Alcohol / Substance Use: Not Applicable Psych Involvement: No (comment)  Admission diagnosis:  Bacteremia [R78.81] Sepsis secondary to UTI (HCC) [A41.9, N39.0] Patient Active Problem List   Diagnosis Date Noted   Sepsis secondary to UTI (HCC) 01/06/2024   Acute kidney injury (HCC) 12/29/2023   Foot callus 10/27/2023   Onychomycosis 10/27/2023   Palpitations 08/25/2022   Prostate cancer (HCC) 03/06/2019   Family history of colon cancer 10/19/2018   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   Spinal stenosis at L4-L5 level 09/22/2016   AV block    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   Depression 11/19/2015   Primary osteoarthritis of first carpometacarpal joint of left hand 10/23/2015   Chronic renal insufficiency, stage II (mild) 07/02/2014   Coronary artery disease involving native coronary artery of native heart with angina pectoris (HCC)    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/2013   Migraine headache 03/26/2013   GERD (gastroesophageal reflux disease) 11/14/2012  Routine general medical examination at a health care facility 07/28/2012   ERECTILE DYSFUNCTION 08/12/2010   Hyperlipidemia with target LDL less than 70 10/15/2009   TOBACCO USE 10/15/2009   Essential hypertension 10/15/2009   MYOCARDIAL INFARCTION, HX OF 10/15/2009   PCP:  Delbert Clam,  MD Pharmacy:   [phone] [address], [place] - [address] AT [facility] [address] [place] [address] Phone: [phone] Fax: [phone]     Social Drivers of Health (SDOH) Social History: SDOH Screenings   Food Insecurity: No Food Insecurity (01/06/2024)  Recent Concern: Food Insecurity - Food Insecurity Present (12/26/2023)  Housing: Low Risk  (01/06/2024)  Transportation Needs: No Transportation Needs (01/06/2024)  Utilities: Not At Risk (01/06/2024)  Alcohol Screen: Low Risk  (03/09/2023)  Depression (PHQ2-9): Low Risk  (11/08/2023)  Recent Concern: Depression (PHQ2-9) - Medium Risk (10/27/2023)  Financial Resource Strain: Medium Risk (03/09/2023)  Physical Activity: Inactive (03/09/2023)  Social Connections: Moderately Isolated (12/26/2023)  Stress: Stress Concern Present (03/09/2023)  Tobacco Use: Medium Risk (01/06/2024)  Health Literacy: Adequate Health Literacy (03/09/2023)   SDOH Interventions: Food Insecurity Interventions: Intervention Not Indicated, Inpatient TOC   Readmission Risk Interventions    01/09/2024   12:50 PM  Readmission Risk Prevention Plan  Transportation Screening Complete  PCP or Specialist Appt within 5-7 Days Complete  Home Care Screening Complete  Medication Review (RN CM) Complete

## 2024-01-09 NOTE — Progress Notes (Signed)
 Referring Physician(s): Gay,M  Supervising Physician: Hughes Simmonds  Patient Status:  [facility] - In-pt  Chief Complaint: Post op urine leak; s/p left pelvic drain placement 8/9   Subjective: Pt resting quietly in bed; currently without new c/o   Allergies: Lisinopril  Medications: Prior to Admission medications   Medication Sig Start Date End Date Taking? Authorizing Provider  diphenhydramine -acetaminophen  (TYLENOL  PM) 25-500 MG TABS tablet Take 1 tablet by mouth at bedtime as needed (for pain,sleep).   Yes [provider]  docusate sodium  (COLACE) 100 MG capsule Take 1 capsule (100 mg total) by mouth 2 (two) times daily. 12/26/23  Yes Dancy, Amanda, PA-C  DULoxetine  (CYMBALTA ) 60 MG capsule Take 1 capsule (60 mg total) by mouth daily. For chronic pain 11/08/23  Yes Delbert Clam, MD  Evolocumab  (REPATHA  SURECLICK) 140 MG/ML SOAJ INJECT 140 MG INTO THE SKIN EVERY 14 (FOURTEEN) DAYS. 09/26/23  Yes Chandrasekhar, Mahesh A, MD  HYDROcodone -acetaminophen  (NORCO/VICODIN) 5-325 MG tablet Take 1-2 tablets by mouth every 6 (six) hours as needed for moderate pain (pain score 4-6) or severe pain (pain score 7-10). 12/26/23  Yes Dancy, Alan, PA-C  lactulose  (CHRONULAC ) 10 GM/15ML solution Take 15 mLs (10 g total) by mouth 3 (three) times daily. 12/29/23  Yes Newlin, Enobong, MD  losartan -hydrochlorothiazide  (HYZAAR ) 100-25 MG tablet Take 1 tablet by mouth daily. 11/08/23  Yes Newlin, Enobong, MD  ranolazine  (RANEXA ) 500 MG 12 hr tablet Take 1 tablet (500 mg total) by mouth 2 (two) times daily. 08/23/23  Yes Newlin, Enobong, MD  rosuvastatin  (CRESTOR ) 40 MG tablet Take 1 tablet (40 mg total) by mouth daily. 08/23/23  Yes Newlin, Enobong, MD  amLODipine  (NORVASC ) 10 MG tablet Take 1 tablet (10 mg total) by mouth daily. 08/23/23   Newlin, Enobong, MD  nitroGLYCERIN  (NITROSTAT ) 0.4 MG SL tablet Place 1 tablet (0.4 mg total) under the tongue every 5 (five) minutes as needed for chest pain. 12/29/23    Delbert Clam, MD     Vital Signs: BP (!) 170/74   Pulse (!) 58   Temp 98 F (36.7 C) (Oral)   Resp 15   Ht 5' 8 (1.727 m)   Wt 152 lb 8.9 oz (69.2 kg)   SpO2 100%   BMI 23.20 kg/m   Physical Exam awake/alert; left TG/PELVIC IR drain intact, dressing clean and dry, not sig tender, OP minimal amount yellow fluid; drain flushed without sig return  Imaging: DG C-Arm 1-60 Min-No Report Result Date: 01/08/2024 Fluoroscopy was utilized by the requesting physician.  No radiographic interpretation.   CT GUIDED PERITONEAL/RETROPERITONEAL FLUID DRAIN BY PERC CATH Result Date: 01/07/2024 INDICATION: 63 year old with history of prostatectomy and pelvic fluid collection. Concern for a urinary leak. EXAM: CT GUIDED DRAIN PLACEMENT IN PELVIC FLUID COLLECTION TECHNIQUE: Multidetector CT imaging of the pelvis was performed following the standard protocol with and without contrast IV contrast. RADIATION DOSE REDUCTION: This exam was performed according to the departmental dose-optimization program which includes automated exposure control, adjustment of the mA and/or kV according to patient size and/or use of iterative reconstruction technique. CONTRAST:  50 mL Omnipaque  300 MEDICATIONS: Moderate sedation ANESTHESIA/SEDATION: Moderate (conscious) sedation was employed during this procedure. A total of Versed  4 mg and Fentanyl  200 mcg was administered intravenously by the radiology nurse. Total intra-service moderate Sedation Time: 50 minutes. The patient's level of consciousness and vital signs were monitored continuously by radiology nursing throughout the procedure under my direct supervision. COMPLICATIONS: None immediate. PROCEDURE: Informed written consent was obtained from  the patient after a thorough discussion of the procedural risks, benefits and alternatives. All questions were addressed. A timeout was performed prior to the initiation of the procedure. Patient was placed supine on CT scanner. CT  images through the pelvis were obtained without contrast. Additional CT images were obtained after the administration intravenous contrast to better characterize the fluid collections. Patient was placed prone and additional CT images were obtained. Urinary leak was identified at this time. Findings discussed with Dr. Selma in urology. We decided to proceed with a transgluteal pelvic drain placement. The left buttock was prepped with chlorhexidine  and sterile field was created. Maximal barrier sterile technique was utilized including caps, mask, sterile gowns, sterile gloves, sterile drape, hand hygiene and skin antiseptic. Skin was anesthetized with 1% lidocaine . Small incision was made. Using CT guidance, an 18 gauge trocar needle was directed into the left posterior fluid collection from a transgluteal approach. Yellow fluid was aspirated. Superstiff Amplatz wire was placed. The tract was dilated to accommodate a 10 [place] multipurpose drain. Additional yellow fluid was obtained. Fluid was sent for culture and creatinine level. Drain was attached to a suction bulb and sutured to the skin. Dressing was placed. FINDINGS: Initial set of images demonstrated a complex fluid collection in the pelvis surrounding the rectum. In addition, there appeared to be a new fluid collection in the right lower quadrant extending to the pelvis. Subsequent images were obtained with the patient prone and there was contrast extravasating in the pelvis. There appears to be a leak on image 47, sequence number 7. This leak is near the base of the bladder along the left posterior aspect. There is contrast extravasating anterior and posterior to the bladder. Drain placed from a left transgluteal approach. Drain is situated within the left component of the pelvic fluid collection. Yellow fluid was aspirated from the drain. IMPRESSION: 1. Positive for a urinary leak. Leak is located along the left posterior aspect of the urethra near the  bladder base. 2. CT-guided placement of a left transgluteal pelvic drain. Electronically Signed   By: Juliene Balder M.D.   On: 01/07/2024 19:53   CT CYSTOGRAM PELVIS Result Date: 01/07/2024 CLINICAL DATA:  History of recent prostatectomy with recurrent pelvic fluid collection which has been drained. Bladder leak was noted during the drainage catheter placement. EXAM: CT CYSTOGRAM (CT ABDOMEN AND PELVIS WITH CONTRAST) TECHNIQUE: Multi-detector CT imaging through the abdomen and pelvis was performed after dilute contrast had been introduced into the bladder for the purposes of performing CT cystography. RADIATION DOSE REDUCTION: This exam was performed according to the departmental dose-optimization program which includes automated exposure control, adjustment of the mA and/or kV according to patient size and/or use of iterative reconstruction technique. CONTRAST:  OMNIPAQUE  IOHEXOL  300 MG/ML  SOLN COMPARISON:  Drainage procedure from earlier in the same day as well as CT from the previous day. FINDINGS: Lower chest: Mild atelectatic changes are noted in the bases bilaterally. Hepatobiliary: No focal liver abnormality is seen. No gallstones, gallbladder wall thickening, or biliary dilatation. Vicarious excretion of contrast is noted within the gallbladder. Pancreas: Unremarkable. No pancreatic ductal dilatation or surrounding inflammatory changes. Spleen: Normal in size without focal abnormality. Adrenals/Urinary Tract: Adrenal glands are within normal limits. Kidneys demonstrate a normal enhancement pattern bilaterally. Normal excretion is seen. The bladder is distended with contrast enhanced urine related to retrograde placement via the Foley catheter as well as recently injected IV contrast. No obstructive changes are seen. The bladder is well distended and  there is a large bladder wall defect noted at the bladder neck inferiorly and posteriorly on the left similar to that seen on the recent CT intervention.  Apparent extension into the proximal urethra is noted. Considerable extravasation of contrast is noted around the base of the bladder extraperitoneally. There does not appear to be any passage of contrast material into the recently drained fluid collection which appears decreased in size when compared with the prior exam. Additionally no significant passage of contrast into the peritoneal space is noted. Stomach/Bowel: No obstructive or inflammatory changes of the colon are seen. Mild small bowel dilatation is noted which appears increased when compare with the prior exam which may represent a degree of postoperative ileus reactive to the free fluid in the pelvis and peritoneal cavity. Stomach is well distended with fluid. The appendix is not well appreciated. Vascular/Lymphatic: Aortic atherosclerosis. No enlarged abdominal or pelvic lymph nodes. Reproductive: Prostate has been surgically removed. Other: Multiple fluid collections are identified within the abdomen. One lies along the inferior margin of the liver interposed between the liver and the right kidney which measures approximately 4.2 x 2.6 cm best seen on image number 17 of series 7. A second larger collection is noted in the right hemiabdomen which is somewhat lunate in shape measuring approximately 9.0 x 3.0 cm best seen on image number 30 of series 7. Additional right-sided collection is noted inferiorly best seen on image number 45 of series 7 measuring up to 8.3 cm. The known recently drained pelvic collection has decreased significantly in size. Musculoskeletal: No acute bony abnormality is noted. IMPRESSION: Large defect in the bladder neck and proximal urethra posteriorly on the left similar to that seen on the prior interventional CT images. The defect measures approximately 13 mm on the coronal images and up to 2 cm posteriorly on the sagittal delayed images. On the axial images it measures approximately 10 mm in AP dimension. Extraperitoneal  extravasation of contrast is noted. No intraperitoneal involvement is seen. Increase in size and number of additional fluid collections within the peritoneum as described. These are new when compared with the exam from the previous day. Mild small bowel dilatation is noted which may be related to a degree of ileus reactive to the fluid changes within the abdomen. Correlation with physical exam is recommended. Interval placement of drainage catheter in the left inferior pelvis with significant decompression of the previously seen fluid collection. Electronically Signed   By: Oneil Devonshire M.D.   On: 01/07/2024 19:23   CT HEMATURIA WORKUP Result Date: 01/07/2024 CLINICAL DATA:  Hematuria. EXAM: CT ABDOMEN AND PELVIS WITHOUT AND WITH CONTRAST TECHNIQUE: Multidetector CT imaging of the abdomen and pelvis was performed following the standard protocol before and following the bolus administration of intravenous contrast. RADIATION DOSE REDUCTION: This exam was performed according to the departmental dose-optimization program which includes automated exposure control, adjustment of the mA and/or kV according to patient size and/or use of iterative reconstruction technique. CONTRAST:  OMNIPAQUE  IOHEXOL  300 MG/ML  SOLN COMPARISON:  CT abdomen and pelvis 01/06/2024 FINDINGS: Lower chest: There is mild atelectasis in the lung bases. Hepatobiliary: There is some excreted contrast in the gallbladder. Otherwise, liver, gallbladder and bile ducts are within normal limits. Pancreas: Unremarkable. No pancreatic ductal dilatation or surrounding inflammatory changes. Spleen: Normal in size without focal abnormality. Adrenals/Urinary Tract: There is a rounded hypodensity in the inferior pole the right kidney most compatible with cysts measuring 1 cm. No other focal renal lesions are identified.  Kidneys are normal in size and contour. There is no perinephric fluid or stranding. There is a small amount of residual contrast seen in  the kidneys on precontrast imaging which can be seen with acute renal function disorder. There is no hydronephrosis. The bilateral ureters are opacified to the level of the pelvic inlet and appear within normal limits. The distal ureters are not well opacified. There is a Foley catheter in the bladder. The bladder is distended with contrast. There is bladder rupture with leakage of contrast from the inferior left bladder wall extending into extraperitoneal spaces inferiorly and anteriorly bilaterally. Stomach/Bowel: There are numerous dilated small bowel loops measuring up to 4.2 cm with air-fluid levels. These have increased in size when compared to prior. No definitive transition point visualized. The colon appears nondilated. There is a moderate air-fluid level in the stomach. Vascular/Lymphatic: The aorta and IVC are normal in size. There are atherosclerotic calcifications of the aorta. No enlarged lymph nodes are identified. Reproductive: Prostate gland is not well defined. Other: There is a new posterior percutaneous catheter with distal tip in the upper left pelvis. Previously identified enhancing fluid collection in the posterior pelvis has slightly decreased in size but now contains air. This collection measures 6.5 x 4.5 cm (previously 9.5 x 7.3 cm on similar image. There is new enhancing fluid collection in the paracolic gutter abutting multiple small bowel loops. This collection measures approximately 8.0 x 3.7 by 10.0 cm. There is also a new small amount of fluid between the inferior margin of the liver and right kidney without enhancement. This fluid measures 1.7 x 3.6 by 7.5 cm. Musculoskeletal: No fracture is seen. IMPRESSION: 1. Extraperitoneal bladder rupture with leakage of contrast from the inferior left bladder wall extending into extraperitoneal spaces inferiorly and anteriorly bilaterally. 2. New posterior percutaneous catheter with distal tip in the upper left pelvis. 3. Previously identified  enhancing fluid collection in the posterior pelvis has slightly decreased in size but now contains air. 4. New enhancing fluid collection in the right paracolic gutter abutting multiple small bowel loops measuring 8.0 x 3.7 x 10.0 cm. 5. New small amount of fluid between the inferior margin of the liver and right kidney without enhancement. 6. New small bilateral pleural effusions. 7. New small amount of residual contrast in the kidneys on precontrast imaging which can be seen with acute renal injury. 8. Increasing size of small bowel loops with air-fluid levels. No definitive transition point visualized. Findings may be related to ileus or partial small bowel obstruction. 9. Aortic atherosclerosis. Aortic Atherosclerosis (ICD10-I70.0). Electronically Signed   By: Greig Pique M.D.   On: 01/07/2024 19:15   CT ABDOMEN PELVIS W CONTRAST Result Date: 01/06/2024 CLINICAL DATA:  Abdominal pain, post-op. * Tracking Code: BO * EXAM: CT ABDOMEN AND PELVIS WITH CONTRAST TECHNIQUE: Multidetector CT imaging of the abdomen and pelvis was performed using the standard protocol following bolus administration of intravenous contrast. RADIATION DOSE REDUCTION: This exam was performed according to the departmental dose-optimization program which includes automated exposure control, adjustment of the mA and/or kV according to patient size and/or use of iterative reconstruction technique. CONTRAST:  75mL OMNIPAQUE  IOHEXOL  350 MG/ML SOLN COMPARISON:  CT scan abdomen and pelvis from 01/04/2024. FINDINGS: Lower chest: There are subpleural atelectatic changes in the visualized lung bases. No overt consolidation. No pleural effusion. The heart is normal in size. No pericardial effusion. Hepatobiliary: The liver is normal in size. Non-cirrhotic configuration. No suspicious mass. No intrahepatic or extrahepatic bile duct dilation.  There are new hyperattenuating areas in the dependent portion of the gallbladder, which may represent  vicarious excretion of previously administered intravenous contrast. Normal gallbladder wall thickness. No pericholecystic inflammatory changes. Pancreas: Unremarkable. No pancreatic ductal dilatation or surrounding inflammatory changes. Spleen: Within normal limits. No focal lesion. Adrenals/Urinary Tract: Adrenal glands are unremarkable. No suspicious renal mass. No hydronephrosis. No renal or ureteric calculi. Urinary bladder is decompressed secondary to Foley catheter, which appears in satisfactory position Stomach/Bowel: No disproportionate dilation of the small or large bowel loops. Appendix is not distinctly visualized. Vascular/Lymphatic: Redemonstration of fluid collection in the dependent pelvis currently measuring up to 7.3 x 9.6 cm, increased since the prior study. The collection exhibits thin hyperattenuating incomplete walls. Findings again may represent postoperative seroma versus developing abscess. There is also redemonstration of fluid and small amount of nondependent air in the space of Retzius, surrounding the urinary bladder as well as along the bilateral pelvic sidewalls, which is likely secondary to false passage/malposition of the Foley bulb seen on the prior exam. No abdominal or pelvic lymphadenopathy, by size criteria. No aneurysmal dilation of the major abdominal arteries. There are moderate peripheral atherosclerotic vascular calcifications of the aorta and its major branches. Reproductive: There is history of prostate resection. Other: Supraumbilical midline surgical scar noted. Musculoskeletal: No suspicious osseous lesions. There are mild multilevel degenerative changes in the visualized spine. IMPRESSION: 1. Redemonstration of fluid collection in the dependent pelvis, which has increased since the prior study. The collection exhibits thin hyperattenuating incomplete walls. Findings again may represent postoperative seroma versus developing abscess. 2. There is also redemonstration of  fluid and small amount of nondependent air in the Space of Retzius, surrounding the urinary bladder as well as along the bilateral pelvic sidewalls, which is likely secondary to false passage/malposition of the Foley bulb seen on the prior exam. 3. Multiple other nonacute observations, as described above. Aortic Atherosclerosis (ICD10-I70.0). Electronically Signed   By: Ree Molt M.D.   On: 01/06/2024 15:03    Labs:  CBC: Recent Labs    01/06/24 1215 01/07/24 0300 01/08/24 0300 01/09/24 0255  WBC 18.0* 16.9* 12.5* 12.7*  HGB 7.9* 8.1* 7.5* 7.7*  HCT 24.6* 25.7* 23.5* 24.3*  PLT 474* 466* 481* 546*    COAGS: Recent Labs    01/04/24 0952 01/07/24 0300  INR 1.1 1.2  APTT 25 38*    BMP: Recent Labs    01/06/24 1215 01/07/24 0300 01/08/24 0300 01/09/24 0255  NA 137 136 136 140  K 3.7 4.0 3.7 3.8  CL 101 101 102 104  CO2 24 24 21* 22  GLUCOSE 90 87 88 97  BUN 27* 22 24* 24*  CALCIUM  9.0 8.6* 8.6* 8.6*  CREATININE 1.67* 1.38* 1.29* 1.19  GFRNONAA 46* 58* >60 >60    LIVER FUNCTION TESTS: Recent Labs    12/28/23 2344 01/04/24 0952 01/06/24 1546 01/07/24 0300  BILITOT 0.8 0.9 0.8 1.2  AST 33 15 19 21   ALT 16 11 13 12   ALKPHOS 63 48 49 52  PROT 8.7* 6.4* 6.4* 7.1  ALBUMIN  4.2 3.1* 2.7* 2.9*    Assessment and Plan: Patient with hx prostate cancer, s/p robotic assisted laparoscopic radical prostatectomy and bilateral pelvic lymph node dissection on 12/26/2023 ; post op urine leak; s/p left TG/PELVIC drain placement 8/9; on 8/10 pt underwent  1.  Cystoscopy 2.  Bilateral retrograde pyelogram with interpretation 3.  Bilateral ureteral stent placement 4. Fluoroscopy <1 hour with intraoperative interpretation 5.  Lysis of adhesions 6.  Robotic-assisted laparoscopic vesicourethral anastomotic repair  IR left TG drain remains in place, output minimal  Afebrile, WBC 12.7(12.5), hgb 7.7(7.5), creat 1.19, drain fl cx neg to date  Cont current tx; monitor drain  output closely; further plans as per urology service; hopefully can dc drain soon    Electronically Signed: D. Franky Rakers, PA-C 01/09/2024, 1:37 PM   I spent a total of 15 Minutes at the the patient's bedside AND on the patient's hospital floor or unit, greater than 50% of which was counseling/coordinating care for left pelvic fluid collection drain    Patient ID: [name], male   DOB: Dec 29, 1960, 63 y.o.   MRN: [id number]

## 2024-01-09 NOTE — Progress Notes (Signed)
 PROGRESS NOTE    [name]  FMW:[id number] DOB: [birth year] DOA: 01/06/2024 PCP: Delbert Clam, MD   Brief Narrative:  [name] is a 63 y.o. year old patient with prostate cancer who underwent RALP with b/l PLND on 12/26/2023 without complication and discharged on 12/27/2023.  Patient presents to our facility 01/06/2024 after previous blood cultures reported positive for E. coli.  Prior hospital course: after previous procedure patient had notable pain at home with worsening Foley drainage, nausea, vomiting. He was evaluated 7/30 by urology who replaced Foley which appears to have been poorly positioned after discharge.  1 week later on 8/6 patient had syncopal event at home with abdominal pain found to be hypotensive/hypovolemic in the ED but improved with bolus.  CT during this evaluation noted fluid collection in the pelvis which appears to have increased in size since prior hospitalization.  Assessment & Plan:   Principal Problem:   Sepsis secondary to UTI (HCC)  E. coli bacteremia Sepsis, POA - Source likely urogenital given recent surgery evaluation and Foley being previously dislodged - Robotic-assisted laparoscopic vesicourethral anastomotic repair 8/10 tolerated well - Continue ceftriaxone , supportive care - Repeat blood cultures 8/8 remain negative - Bio[employer] ID enterobacter species identified 8/6; urine culture without significant growth  Prostate cancer status post robotic assisted laparoscopic radical prostatectomy with bilateral lymph node dissection 12/06/2023 -Urology following, noted [id number] GG2 disease  Pelvic fluid collection, rule our urinary/bladder -Unspecified, concern for possible urine leak, ascites versus underlying infection - Fluid drainage/tube placement 8/9 per radiology -culture negative to date - Plan for repair of vesicular urethral anastomotic leak 01/08/2024 per urology, bilateral ureteral stent placement   AKI -continue IV fluids, advance  diet as tolerated postprocedure HTN - well controlled - hold home meds to avoid hypotension CAD -  HLD - hold Evolucomab while in hospital, continue statin   DVT prophylaxis: SCDs Start: 01/06/24 1546   Code Status:   Code Status: Full Code  Family Communication: At bedside  Status is: Inpatient  Dispo: The patient is from: Home              Anticipated d/c is to: Home              Anticipated d/c date is: 24 to 48 hours              Patient currently not medically stable for discharge  Consultants:  Urology, interventional radiology  Procedures:  Pelvic drain placement  Antimicrobials:  Ceftriaxone   Subjective: No acute issues or events overnight denies nausea vomit diarrhea constipation any fever chills chest pain  Objective: Vitals:   01/08/24 1920 01/08/24 2000 01/09/24 0410 01/09/24 0600  BP:  (!) 168/84  (!) 171/85  Pulse:  65  60  Resp:  13  12  Temp: 98.4 F (36.9 C)  98.4 F (36.9 C)   TempSrc: Oral  Oral   SpO2:  99%  99%  Weight:      Height:        Intake/Output Summary (Last 24 hours) at 01/09/2024 0711 Last data filed at 01/09/2024 0500 Gross per 24 hour  Intake 1720 ml  Output 890 ml  Net 830 ml   Filed Weights   01/06/24 1044  Weight: 69.2 kg    Examination:  General exam: Appears calm and comfortable  Respiratory system: Clear to auscultation. Respiratory effort normal. Cardiovascular system: S1 & S2 heard, RRR. No JVD, murmurs, rubs, gallops or clicks. No pedal edema. Gastrointestinal system: Abdomen is nondistended, soft  and nontender. No organomegaly or masses felt. Normal bowel sounds heard. Central nervous system: Alert and oriented. No focal neurological deficits. Extremities: Symmetric 5 x 5 power. Skin: No rashes, lesions or ulcers Psychiatry: Judgement and insight appear normal. Mood & affect appropriate.     Data Reviewed: I have personally reviewed following labs and imaging studies  CBC: Recent Labs  Lab  01/04/24 0952 01/04/24 0953 01/06/24 1215 01/07/24 0300 01/08/24 0300 01/09/24 0255  WBC 7.3  --  18.0* 16.9* 12.5* 12.7*  NEUTROABS 5.2  --  16.7*  --   --   --   HGB 9.3* 10.2* 7.9* 8.1* 7.5* 7.7*  HCT 29.0* 30.0* 24.6* 25.7* 23.5* 24.3*  MCV 95.7  --  93.9 95.2 94.4 92.0  PLT 472*  --  474* 466* 481* 546*   Basic Metabolic Panel: Recent Labs  Lab 01/04/24 0952 01/04/24 0953 01/06/24 1215 01/07/24 0300 01/08/24 0300 01/09/24 0255  NA 134* 136 137 136 136 140  K 3.4* 3.4* 3.7 4.0 3.7 3.8  CL 101 99 101 101 102 104  CO2 23  --  24 24 21* 22  GLUCOSE 140* 137* 90 87 88 97  BUN 12 12 27* 22 24* 24*  CREATININE 1.54* 1.50* 1.67* 1.38* 1.29* 1.19  CALCIUM  8.5*  --  9.0 8.6* 8.6* 8.6*   GFR: Estimated Creatinine Clearance: 62.3 mL/min (by C-G formula based on SCr of 1.19 mg/dL). Liver Function Tests: Recent Labs  Lab 01/04/24 0952 01/06/24 1546 01/07/24 0300  AST 15 19 21   ALT 11 13 12   ALKPHOS 48 49 52  BILITOT 0.9 0.8 1.2  PROT 6.4* 6.4* 7.1  ALBUMIN  3.1* 2.7* 2.9*   Recent Labs  Lab 01/04/24 0953  LIPASE 28   No results for input(s): AMMONIA in the last 168 hours. Coagulation Profile: Recent Labs  Lab 01/04/24 0952 01/07/24 0300  INR 1.1 1.2   Cardiac Enzymes: No results for input(s): CKTOTAL, CKMB, CKMBINDEX, TROPONINI in the last 168 hours. BNP (last 3 results) No results for input(s): PROBNP in the last 8760 hours. HbA1C: No results for input(s): HGBA1C in the last 72 hours. CBG: Recent Labs  Lab 01/04/24 0948  GLUCAP 144*   Lipid Profile: No results for input(s): CHOL, HDL, LDLCALC, TRIG, CHOLHDL, LDLDIRECT in the last 72 hours. Thyroid  Function Tests: No results for input(s): TSH, T4TOTAL, FREET4, T3FREE, THYROIDAB in the last 72 hours. Anemia Panel: No results for input(s): VITAMINB12, FOLATE, FERRITIN, TIBC, IRON, RETICCTPCT in the last 72 hours. Sepsis Labs: Recent Labs  Lab  01/04/24 1034 01/04/24 1309 01/06/24 1544 01/06/24 1849  LATICACIDVEN 1.8 1.6 0.9 0.9    Recent Results (from the past 240 hours)  Blood culture (routine x 2)     Status: None   Collection Time: 01/04/24 12:42 PM   Specimen: BLOOD  Result Value Ref Range Status   Specimen Description BLOOD RIGHT ANTECUBITAL  Final   Special Requests   Final    BOTTLES DRAWN AEROBIC AND ANAEROBIC Blood Culture adequate volume   Culture   Final    NO GROWTH 5 DAYS Performed at [facility], [address], [place], [place] [address]    Report Status 01/09/2024 FINAL  Final  Blood culture (routine x 2)     Status: Abnormal   Collection Time: 01/04/24  1:00 PM   Specimen: BLOOD RIGHT HAND  Result Value Ref Range Status   Specimen Description BLOOD RIGHT HAND  Final   Special Requests  Final    BOTTLES DRAWN AEROBIC AND ANAEROBIC Blood Culture adequate volume   Culture  Setup Time   Final    GRAM NEGATIVE RODS AEROBIC BOTTLE ONLY CRITICAL RESULT CALLED TO, READ BACK BY AND VERIFIED WITH: ED CHARGE RN OZELL CORDOBA [phone] AT 0959, ADC Performed at [facility], [address], [place], [place] [address]    Culture ESCHERICHIA COLI (A)  Final   Report Status 01/07/2024 FINAL  Final   Organism ID, Bacteria ESCHERICHIA COLI  Final   Organism ID, Bacteria ESCHERICHIA COLI  Final      Susceptibility   Escherichia coli - KIRBY BAUER*    CEFAZOLIN  SENSITIVE Sensitive    Escherichia coli - MIC*    AMPICILLIN <=2 SENSITIVE Sensitive     CEFEPIME <=0.12 SENSITIVE Sensitive     CEFTAZIDIME <=1 SENSITIVE Sensitive     CEFTRIAXONE  <=0.25 SENSITIVE Sensitive     CIPROFLOXACIN <=0.25 SENSITIVE Sensitive     GENTAMICIN <=1 SENSITIVE Sensitive     IMIPENEM <=0.25 SENSITIVE Sensitive     TRIMETH /SULFA  <=20 SENSITIVE Sensitive     AMPICILLIN/SULBACTAM <=2 SENSITIVE Sensitive     PIP/TAZO <=4 SENSITIVE Sensitive ug/mL    * ESCHERICHIA COLI    ESCHERICHIA COLI  Blood Culture ID Panel (Reflexed)      Status: Abnormal   Collection Time: 01/04/24  1:00 PM  Result Value Ref Range Status   Enterococcus faecalis NOT DETECTED NOT DETECTED Final   Enterococcus Faecium NOT DETECTED NOT DETECTED Final   Listeria monocytogenes NOT DETECTED NOT DETECTED Final   Staphylococcus species NOT DETECTED NOT DETECTED Final   Staphylococcus aureus (BCID) NOT DETECTED NOT DETECTED Final   Staphylococcus epidermidis NOT DETECTED NOT DETECTED Final   Staphylococcus lugdunensis NOT DETECTED NOT DETECTED Final   Streptococcus species NOT DETECTED NOT DETECTED Final   Streptococcus agalactiae NOT DETECTED NOT DETECTED Final   Streptococcus pneumoniae NOT DETECTED NOT DETECTED Final   Streptococcus pyogenes NOT DETECTED NOT DETECTED Final   A.calcoaceticus-baumannii NOT DETECTED NOT DETECTED Final   Bacteroides fragilis NOT DETECTED NOT DETECTED Final   Enterobacterales DETECTED (A) NOT DETECTED Final    Comment: Enterobacterales represent a large order of gram negative bacteria, not a single organism. CRITICAL RESULT CALLED TO, READ BACK BY AND VERIFIED WITH: ED CHARGE RN MICHAEL D. [phone] AT [phone], ADC    Enterobacter cloacae complex NOT DETECTED NOT DETECTED Final   Escherichia coli DETECTED (A) NOT DETECTED Final    Comment: CRITICAL RESULT CALLED TO, READ BACK BY AND VERIFIED WITH: ED CHARGE RN MICHAEL D. [phone] AT 0959, ADC    Klebsiella aerogenes NOT DETECTED NOT DETECTED Final   Klebsiella oxytoca NOT DETECTED NOT DETECTED Final   Klebsiella pneumoniae NOT DETECTED NOT DETECTED Final   Proteus species NOT DETECTED NOT DETECTED Final   Salmonella species NOT DETECTED NOT DETECTED Final   Serratia marcescens NOT DETECTED NOT DETECTED Final   Haemophilus influenzae NOT DETECTED NOT DETECTED Final   Neisseria meningitidis NOT DETECTED NOT DETECTED Final   Pseudomonas aeruginosa NOT DETECTED NOT DETECTED Final   Stenotrophomonas maltophilia NOT DETECTED NOT DETECTED Final   Candida albicans NOT  DETECTED NOT DETECTED Final   Candida auris NOT DETECTED NOT DETECTED Final   Candida glabrata NOT DETECTED NOT DETECTED Final   Candida krusei NOT DETECTED NOT DETECTED Final   Candida parapsilosis NOT DETECTED NOT DETECTED Final   Candida tropicalis NOT DETECTED NOT DETECTED Final   Cryptococcus neoformans/gattii NOT DETECTED NOT DETECTED  Final   CTX-M ESBL NOT DETECTED NOT DETECTED Final   Carbapenem resistance IMP NOT DETECTED NOT DETECTED Final   Carbapenem resistance KPC NOT DETECTED NOT DETECTED Final   Carbapenem resistance NDM NOT DETECTED NOT DETECTED Final   Carbapenem resist OXA 48 LIKE NOT DETECTED NOT DETECTED Final   Carbapenem resistance VIM NOT DETECTED NOT DETECTED Final    Comment: Performed at [facility], [address], [place], [place] [address]  Blood culture (routine x 2)     Status: None (Preliminary result)   Collection Time: 01/06/24 12:15 PM   Specimen: BLOOD  Result Value Ref Range Status   Specimen Description BLOOD SITE NOT SPECIFIED  Final   Special Requests   Final    BOTTLES DRAWN AEROBIC AND ANAEROBIC Blood Culture adequate volume   Culture   Final    NO GROWTH 3 DAYS Performed at [facility], [address], [place], [place] [address]    Report Status PENDING  Incomplete  Blood culture (routine x 2)     Status: None (Preliminary result)   Collection Time: 01/06/24 12:20 PM   Specimen: BLOOD LEFT ARM  Result Value Ref Range Status   Specimen Description BLOOD LEFT ARM  Final   Special Requests   Final    BOTTLES DRAWN AEROBIC AND ANAEROBIC Blood Culture adequate volume   Culture   Final    NO GROWTH 3 DAYS Performed at [facility], [address], [place], [place] [address]    Report Status PENDING  Incomplete  Culture, Urine (Do not remove urinary catheter, catheter placed by urology or difficult to place)     Status: Abnormal   Collection Time: 01/06/24  3:49 PM   Specimen: Urine, Catheterized  Result Value Ref Range  Status   Specimen Description URINE, CATHETERIZED  Final   Special Requests NONE  Final   Culture (A)  Final    <10,000 COLONIES/mL INSIGNIFICANT GROWTH Performed at [facility], [address] [place], [place] [address]    Report Status 01/07/2024 FINAL  Final  Body fluid culture w Gram Stain     Status: None (Preliminary result)   Collection Time: 01/07/24 12:29 PM   Specimen: Pelvis; Body Fluid  Result Value Ref Range Status   Specimen Description   Final    PELVIS Performed at [facility], [address] [place], [place] [address]    Special Requests   Final    NONE Performed at [facility], [address], [place], [place] [address]    Gram Stain   Final    ABUNDANT WBC PRESENT, PREDOMINANTLY PMN NO ORGANISMS SEEN    Culture   Final    NO GROWTH < 24 HOURS Performed at [facility], [address] [place], [place] [address]    Report Status PENDING  Incomplete  Aerobic/Anaerobic Culture w Gram Stain (surgical/deep wound)     Status: None (Preliminary result)   Collection Time: 01/07/24  1:40 PM   Specimen: Pelvis; Peritoneal Fluid  Result Value Ref Range Status   Specimen Description   Final    PELVIS Performed at [facility], [address], [place], [place] [address]    Special Requests   Final    NONE Performed at [facility], [address], [place], [place] [address]    Gram Stain   Final    MODERATE WBC PRESENT, PREDOMINANTLY PMN NO  ORGANISMS SEEN    Culture   Final    NO GROWTH < 24 HOURS Performed at [facility], [address] [place], [place] [address]    Report Status PENDING  Incomplete         Radiology Studies: DG C-Arm 1-60 Min-No Report Result Date: 01/08/2024 Fluoroscopy was utilized by the requesting physician.  No radiographic interpretation.   CT GUIDED PERITONEAL/RETROPERITONEAL FLUID DRAIN BY PERC CATH Result Date:  01/07/2024 INDICATION: 63 year old with history of prostatectomy and pelvic fluid collection. Concern for a urinary leak. EXAM: CT GUIDED DRAIN PLACEMENT IN PELVIC FLUID COLLECTION TECHNIQUE: Multidetector CT imaging of the pelvis was performed following the standard protocol with and without contrast IV contrast. RADIATION DOSE REDUCTION: This exam was performed according to the departmental dose-optimization program which includes automated exposure control, adjustment of the mA and/or kV according to patient size and/or use of iterative reconstruction technique. CONTRAST:  50 mL Omnipaque  300 MEDICATIONS: Moderate sedation ANESTHESIA/SEDATION: Moderate (conscious) sedation was employed during this procedure. A total of Versed  4 mg and Fentanyl  200 mcg was administered intravenously by the radiology nurse. Total intra-service moderate Sedation Time: 50 minutes. The patient's level of consciousness and vital signs were monitored continuously by radiology nursing throughout the procedure under my direct supervision. COMPLICATIONS: None immediate. PROCEDURE: Informed written consent was obtained from the patient after a thorough discussion of the procedural risks, benefits and alternatives. All questions were addressed. A timeout was performed prior to the initiation of the procedure. Patient was placed supine on CT scanner. CT images through the pelvis were obtained without contrast. Additional CT images were obtained after the administration intravenous contrast to better characterize the fluid collections. Patient was placed prone and additional CT images were obtained. Urinary leak was identified at this time. Findings discussed with Dr. Selma in urology. We decided to proceed with a transgluteal pelvic drain placement. The left buttock was prepped with chlorhexidine  and sterile field was created. Maximal barrier sterile technique was utilized including caps, mask, sterile gowns, sterile gloves, sterile drape, hand  hygiene and skin antiseptic. Skin was anesthetized with 1% lidocaine . Small incision was made. Using CT guidance, an 18 gauge trocar needle was directed into the left posterior fluid collection from a transgluteal approach. Yellow fluid was aspirated. Superstiff Amplatz wire was placed. The tract was dilated to accommodate a 10 [place] multipurpose drain. Additional yellow fluid was obtained. Fluid was sent for culture and creatinine level. Drain was attached to a suction bulb and sutured to the skin. Dressing was placed. FINDINGS: Initial set of images demonstrated a complex fluid collection in the pelvis surrounding the rectum. In addition, there appeared to be a new fluid collection in the right lower quadrant extending to the pelvis. Subsequent images were obtained with the patient prone and there was contrast extravasating in the pelvis. There appears to be a leak on image 47, sequence number 7. This leak is near the base of the bladder along the left posterior aspect. There is contrast extravasating anterior and posterior to the bladder. Drain placed from a left transgluteal approach. Drain is situated within the left component of the pelvic fluid collection. Yellow fluid was aspirated from the drain. IMPRESSION: 1. Positive for a urinary leak. Leak is located along the left posterior aspect of the urethra near the bladder base. 2. CT-guided placement of a left transgluteal pelvic drain. Electronically Signed   By: Juliene Balder M.D.   On: 01/07/2024 19:53   CT CYSTOGRAM PELVIS Result  Date: 01/07/2024 CLINICAL DATA:  History of recent prostatectomy with recurrent pelvic fluid collection which has been drained. Bladder leak was noted during the drainage catheter placement. EXAM: CT CYSTOGRAM (CT ABDOMEN AND PELVIS WITH CONTRAST) TECHNIQUE: Multi-detector CT imaging through the abdomen and pelvis was performed after dilute contrast had been introduced into the bladder for the purposes of performing CT cystography.  RADIATION DOSE REDUCTION: This exam was performed according to the departmental dose-optimization program which includes automated exposure control, adjustment of the mA and/or kV according to patient size and/or use of iterative reconstruction technique. CONTRAST:  100mL OMNIPAQUE  IOHEXOL  300 MG/ML  SOLN COMPARISON:  Drainage procedure from earlier in the same day as well as CT from the previous day. FINDINGS: Lower chest: Mild atelectatic changes are noted in the bases bilaterally. Hepatobiliary: No focal liver abnormality is seen. No gallstones, gallbladder wall thickening, or biliary dilatation. Vicarious excretion of contrast is noted within the gallbladder. Pancreas: Unremarkable. No pancreatic ductal dilatation or surrounding inflammatory changes. Spleen: Normal in size without focal abnormality. Adrenals/Urinary Tract: Adrenal glands are within normal limits. Kidneys demonstrate a normal enhancement pattern bilaterally. Normal excretion is seen. The bladder is distended with contrast enhanced urine related to retrograde placement via the Foley catheter as well as recently injected IV contrast. No obstructive changes are seen. The bladder is well distended and there is a large bladder wall defect noted at the bladder neck inferiorly and posteriorly on the left similar to that seen on the recent CT intervention. Apparent extension into the proximal urethra is noted. Considerable extravasation of contrast is noted around the base of the bladder extraperitoneally. There does not appear to be any passage of contrast material into the recently drained fluid collection which appears decreased in size when compared with the prior exam. Additionally no significant passage of contrast into the peritoneal space is noted. Stomach/Bowel: No obstructive or inflammatory changes of the colon are seen. Mild small bowel dilatation is noted which appears increased when compare with the prior exam which may represent a degree  of postoperative ileus reactive to the free fluid in the pelvis and peritoneal cavity. Stomach is well distended with fluid. The appendix is not well appreciated. Vascular/Lymphatic: Aortic atherosclerosis. No enlarged abdominal or pelvic lymph nodes. Reproductive: Prostate has been surgically removed. Other: Multiple fluid collections are identified within the abdomen. One lies along the inferior margin of the liver interposed between the liver and the right kidney which measures approximately 4.2 x 2.6 cm best seen on image number 17 of series 7. A second larger collection is noted in the right hemiabdomen which is somewhat lunate in shape measuring approximately 9.0 x 3.0 cm best seen on image number 30 of series 7. Additional right-sided collection is noted inferiorly best seen on image number 45 of series 7 measuring up to 8.3 cm. The known recently drained pelvic collection has decreased significantly in size. Musculoskeletal: No acute bony abnormality is noted. IMPRESSION: Large defect in the bladder neck and proximal urethra posteriorly on the left similar to that seen on the prior interventional CT images. The defect measures approximately 13 mm on the coronal images and up to 2 cm posteriorly on the sagittal delayed images. On the axial images it measures approximately 10 mm in AP dimension. Extraperitoneal extravasation of contrast is noted. No intraperitoneal involvement is seen. Increase in size and number of additional fluid collections within the peritoneum as described. These are new when compared with the exam from the previous day. Mild small  bowel dilatation is noted which may be related to a degree of ileus reactive to the fluid changes within the abdomen. Correlation with physical exam is recommended. Interval placement of drainage catheter in the left inferior pelvis with significant decompression of the previously seen fluid collection. Electronically Signed   By: Oneil Devonshire M.D.   On:  01/07/2024 19:23   CT HEMATURIA WORKUP Result Date: 01/07/2024 CLINICAL DATA:  Hematuria. EXAM: CT ABDOMEN AND PELVIS WITHOUT AND WITH CONTRAST TECHNIQUE: Multidetector CT imaging of the abdomen and pelvis was performed following the standard protocol before and following the bolus administration of intravenous contrast. RADIATION DOSE REDUCTION: This exam was performed according to the departmental dose-optimization program which includes automated exposure control, adjustment of the mA and/or kV according to patient size and/or use of iterative reconstruction technique. CONTRAST:  OMNIPAQUE  IOHEXOL  300 MG/ML  SOLN COMPARISON:  CT abdomen and pelvis 01/06/2024 FINDINGS: Lower chest: There is mild atelectasis in the lung bases. Hepatobiliary: There is some excreted contrast in the gallbladder. Otherwise, liver, gallbladder and bile ducts are within normal limits. Pancreas: Unremarkable. No pancreatic ductal dilatation or surrounding inflammatory changes. Spleen: Normal in size without focal abnormality. Adrenals/Urinary Tract: There is a rounded hypodensity in the inferior pole the right kidney most compatible with cysts measuring 1 cm. No other focal renal lesions are identified. Kidneys are normal in size and contour. There is no perinephric fluid or stranding. There is a small amount of residual contrast seen in the kidneys on precontrast imaging which can be seen with acute renal function disorder. There is no hydronephrosis. The bilateral ureters are opacified to the level of the pelvic inlet and appear within normal limits. The distal ureters are not well opacified. There is a Foley catheter in the bladder. The bladder is distended with contrast. There is bladder rupture with leakage of contrast from the inferior left bladder wall extending into extraperitoneal spaces inferiorly and anteriorly bilaterally. Stomach/Bowel: There are numerous dilated small bowel loops measuring up to 4.2 cm with air-fluid  levels. These have increased in size when compared to prior. No definitive transition point visualized. The colon appears nondilated. There is a moderate air-fluid level in the stomach. Vascular/Lymphatic: The aorta and IVC are normal in size. There are atherosclerotic calcifications of the aorta. No enlarged lymph nodes are identified. Reproductive: Prostate gland is not well defined. Other: There is a new posterior percutaneous catheter with distal tip in the upper left pelvis. Previously identified enhancing fluid collection in the posterior pelvis has slightly decreased in size but now contains air. This collection measures 6.5 x 4.5 cm (previously 9.5 x 7.3 cm on similar image. There is new enhancing fluid collection in the paracolic gutter abutting multiple small bowel loops. This collection measures approximately 8.0 x 3.7 by 10.0 cm. There is also a new small amount of fluid between the inferior margin of the liver and right kidney without enhancement. This fluid measures 1.7 x 3.6 by 7.5 cm. Musculoskeletal: No fracture is seen. IMPRESSION: 1. Extraperitoneal bladder rupture with leakage of contrast from the inferior left bladder wall extending into extraperitoneal spaces inferiorly and anteriorly bilaterally. 2. New posterior percutaneous catheter with distal tip in the upper left pelvis. 3. Previously identified enhancing fluid collection in the posterior pelvis has slightly decreased in size but now contains air. 4. New enhancing fluid collection in the right paracolic gutter abutting multiple small bowel loops measuring 8.0 x 3.7 x 10.0 cm. 5. New small amount of fluid between the  inferior margin of the liver and right kidney without enhancement. 6. New small bilateral pleural effusions. 7. New small amount of residual contrast in the kidneys on precontrast imaging which can be seen with acute renal injury. 8. Increasing size of small bowel loops with air-fluid levels. No definitive transition point  visualized. Findings may be related to ileus or partial small bowel obstruction. 9. Aortic atherosclerosis. Aortic Atherosclerosis (ICD10-I70.0). Electronically Signed   By: Greig Pique M.D.   On: 01/07/2024 19:15   Scheduled Meds:  Chlorhexidine  Gluconate Cloth  6 each Topical Q2200   DULoxetine   60 mg Oral Daily   feeding supplement  237 mL Oral BID BM   pantoprazole   40 mg Oral BID   rosuvastatin   40 mg Oral Daily   Continuous Infusions:  cefTRIAXone  (ROCEPHIN )  IV 0 g (01/07/24 0927)     LOS: 3 days   Time spent:  Elsie JAYSON [place], DO [facility]  If 7PM-7AM, please contact night-coverage [email or web address]  01/09/2024, 7:11 AM

## 2024-01-09 NOTE — Progress Notes (Signed)
 1 Day Post-Op Subjective: Pain well controlled. No nausea or emesis. Tolerating diet. Afebrile.   Objective: Vital signs in last 24 hours: Temp:  [97.9 F (36.6 C)-98.4 F (36.9 C)] 98 F (36.7 C) (08/11 1155) Pulse Rate:  [54-65] 58 (08/11 1200) Resp:  [12-22] 15 (08/11 1200) BP: (146-172)/(74-88) 170/74 (08/11 1200) SpO2:  [98 %-100 %] 100 % (08/11 1200)  Intake/Output from previous day: 08/10 0701 - 08/11 0700 In: 1720 [P.O.:720; I.V.:1000] Out: 1010 [Urine:850; Drains:150; Blood:10] Intake/Output this shift: Total I/O In: -  Out: 60 [Drains:60] LLQ JP: ss Transgluteal JP 10ss Foley: 1L  Physical Exam:  General: Alert and oriented CV: RRR Lungs: Clear Abdomen: Soft, ND, ATTP; JP ss Ext: NT, No erythema  Lab Results: Recent Labs    01/07/24 0300 01/08/24 0300 01/09/24 0255  HGB 8.1* 7.5* 7.7*  HCT 25.7* 23.5* 24.3*   BMET Recent Labs    01/08/24 0300 01/09/24 0255  NA 136 140  K 3.7 3.8  CL 102 104  CO2 21* 22  GLUCOSE 88 97  BUN 24* 24*  CREATININE 1.29* 1.19  CALCIUM  8.6* 8.6*     Studies/Results: DG C-Arm 1-60 Min-No Report Result Date: 01/08/2024 Fluoroscopy was utilized by the requesting physician.  No radiographic interpretation.   CT CYSTOGRAM PELVIS Result Date: 01/07/2024 CLINICAL DATA:  History of recent prostatectomy with recurrent pelvic fluid collection which has been drained. Bladder leak was noted during the drainage catheter placement. EXAM: CT CYSTOGRAM (CT ABDOMEN AND PELVIS WITH CONTRAST) TECHNIQUE: Multi-detector CT imaging through the abdomen and pelvis was performed after dilute contrast had been introduced into the bladder for the purposes of performing CT cystography. RADIATION DOSE REDUCTION: This exam was performed according to the departmental dose-optimization program which includes automated exposure control, adjustment of the mA and/or kV according to patient size and/or use of iterative reconstruction technique.  CONTRAST:  100mL OMNIPAQUE  IOHEXOL  300 MG/ML  SOLN COMPARISON:  Drainage procedure from earlier in the same day as well as CT from the previous day. FINDINGS: Lower chest: Mild atelectatic changes are noted in the bases bilaterally. Hepatobiliary: No focal liver abnormality is seen. No gallstones, gallbladder wall thickening, or biliary dilatation. Vicarious excretion of contrast is noted within the gallbladder. Pancreas: Unremarkable. No pancreatic ductal dilatation or surrounding inflammatory changes. Spleen: Normal in size without focal abnormality. Adrenals/Urinary Tract: Adrenal glands are within normal limits. Kidneys demonstrate a normal enhancement pattern bilaterally. Normal excretion is seen. The bladder is distended with contrast enhanced urine related to retrograde placement via the Foley catheter as well as recently injected IV contrast. No obstructive changes are seen. The bladder is well distended and there is a large bladder wall defect noted at the bladder neck inferiorly and posteriorly on the left similar to that seen on the recent CT intervention. Apparent extension into the proximal urethra is noted. Considerable extravasation of contrast is noted around the base of the bladder extraperitoneally. There does not appear to be any passage of contrast material into the recently drained fluid collection which appears decreased in size when compared with the prior exam. Additionally no significant passage of contrast into the peritoneal space is noted. Stomach/Bowel: No obstructive or inflammatory changes of the colon are seen. Mild small bowel dilatation is noted which appears increased when compare with the prior exam which may represent a degree of postoperative ileus reactive to the free fluid in the pelvis and peritoneal cavity. Stomach is well distended with fluid. The appendix is not well appreciated.  Vascular/Lymphatic: Aortic atherosclerosis. No enlarged abdominal or pelvic lymph nodes.  Reproductive: Prostate has been surgically removed. Other: Multiple fluid collections are identified within the abdomen. One lies along the inferior margin of the liver interposed between the liver and the right kidney which measures approximately 4.2 x 2.6 cm best seen on image number 17 of series 7. A second larger collection is noted in the right hemiabdomen which is somewhat lunate in shape measuring approximately 9.0 x 3.0 cm best seen on image number 30 of series 7. Additional right-sided collection is noted inferiorly best seen on image number 45 of series 7 measuring up to 8.3 cm. The known recently drained pelvic collection has decreased significantly in size. Musculoskeletal: No acute bony abnormality is noted. IMPRESSION: Large defect in the bladder neck and proximal urethra posteriorly on the left similar to that seen on the prior interventional CT images. The defect measures approximately 13 mm on the coronal images and up to 2 cm posteriorly on the sagittal delayed images. On the axial images it measures approximately 10 mm in AP dimension. Extraperitoneal extravasation of contrast is noted. No intraperitoneal involvement is seen. Increase in size and number of additional fluid collections within the peritoneum as described. These are new when compared with the exam from the previous day. Mild small bowel dilatation is noted which may be related to a degree of ileus reactive to the fluid changes within the abdomen. Correlation with physical exam is recommended. Interval placement of drainage catheter in the left inferior pelvis with significant decompression of the previously seen fluid collection. Electronically Signed   By: Oneil Devonshire M.D.   On: 01/07/2024 19:23   CT HEMATURIA WORKUP Result Date: 01/07/2024 CLINICAL DATA:  Hematuria. EXAM: CT ABDOMEN AND PELVIS WITHOUT AND WITH CONTRAST TECHNIQUE: Multidetector CT imaging of the abdomen and pelvis was performed following the standard protocol  before and following the bolus administration of intravenous contrast. RADIATION DOSE REDUCTION: This exam was performed according to the departmental dose-optimization program which includes automated exposure control, adjustment of the mA and/or kV according to patient size and/or use of iterative reconstruction technique. CONTRAST:  OMNIPAQUE  IOHEXOL  300 MG/ML  SOLN COMPARISON:  CT abdomen and pelvis 01/06/2024 FINDINGS: Lower chest: There is mild atelectasis in the lung bases. Hepatobiliary: There is some excreted contrast in the gallbladder. Otherwise, liver, gallbladder and bile ducts are within normal limits. Pancreas: Unremarkable. No pancreatic ductal dilatation or surrounding inflammatory changes. Spleen: Normal in size without focal abnormality. Adrenals/Urinary Tract: There is a rounded hypodensity in the inferior pole the right kidney most compatible with cysts measuring 1 cm. No other focal renal lesions are identified. Kidneys are normal in size and contour. There is no perinephric fluid or stranding. There is a small amount of residual contrast seen in the kidneys on precontrast imaging which can be seen with acute renal function disorder. There is no hydronephrosis. The bilateral ureters are opacified to the level of the pelvic inlet and appear within normal limits. The distal ureters are not well opacified. There is a Foley catheter in the bladder. The bladder is distended with contrast. There is bladder rupture with leakage of contrast from the inferior left bladder wall extending into extraperitoneal spaces inferiorly and anteriorly bilaterally. Stomach/Bowel: There are numerous dilated small bowel loops measuring up to 4.2 cm with air-fluid levels. These have increased in size when compared to prior. No definitive transition point visualized. The colon appears nondilated. There is a moderate air-fluid level in the stomach.  Vascular/Lymphatic: The aorta and IVC are normal in size. There are  atherosclerotic calcifications of the aorta. No enlarged lymph nodes are identified. Reproductive: Prostate gland is not well defined. Other: There is a new posterior percutaneous catheter with distal tip in the upper left pelvis. Previously identified enhancing fluid collection in the posterior pelvis has slightly decreased in size but now contains air. This collection measures 6.5 x 4.5 cm (previously 9.5 x 7.3 cm on similar image. There is new enhancing fluid collection in the paracolic gutter abutting multiple small bowel loops. This collection measures approximately 8.0 x 3.7 by 10.0 cm. There is also a new small amount of fluid between the inferior margin of the liver and right kidney without enhancement. This fluid measures 1.7 x 3.6 by 7.5 cm. Musculoskeletal: No fracture is seen. IMPRESSION: 1. Extraperitoneal bladder rupture with leakage of contrast from the inferior left bladder wall extending into extraperitoneal spaces inferiorly and anteriorly bilaterally. 2. New posterior percutaneous catheter with distal tip in the upper left pelvis. 3. Previously identified enhancing fluid collection in the posterior pelvis has slightly decreased in size but now contains air. 4. New enhancing fluid collection in the right paracolic gutter abutting multiple small bowel loops measuring 8.0 x 3.7 x 10.0 cm. 5. New small amount of fluid between the inferior margin of the liver and right kidney without enhancement. 6. New small bilateral pleural effusions. 7. New small amount of residual contrast in the kidneys on precontrast imaging which can be seen with acute renal injury. 8. Increasing size of small bowel loops with air-fluid levels. No definitive transition point visualized. Findings may be related to ileus or partial small bowel obstruction. 9. Aortic atherosclerosis. Aortic Atherosclerosis (ICD10-I70.0). Electronically Signed   By: Greig Pique M.D.   On: 01/07/2024 19:15    Assessment/Plan: Prostate cancer  s/p robotic assisted laparoscopic radical prostatectomy and bilateral lymph node dissection on 12/26/2023 with [id number] GG2 disease Posterior vesicourethral anastomotic leak: S/p IR drain 8/9, S/p cysto, b/l stents, robotic vesicourethral anastomotic repair 01/08/2024 Bacteremia: Blood culture 8/6 with enterobacterales and E coli. Repeat blood cultures negative. Urine culture 8/8 <10K insignificant growth. IR drain culture NG. On ceftriaxone .  -Foley to gravity -JP to bulb. -Likely remove transgluteal drain prior to discharge -Continue abx -Planning for cystogram in about 2 weeks   LOS: 3 days   Matt R. Javoni Lucken MD 01/09/2024, 3:06 PM [facility]  Pager: [phone]

## 2024-01-09 NOTE — Plan of Care (Signed)
  Problem: Education: Goal: Knowledge of the procedure and recovery process will improve Outcome: Progressing   Problem: Bowel/Gastric: Goal: Gastrointestinal status for postoperative course will improve Outcome: Progressing   Problem: Pain Management: Goal: General experience of comfort will improve Outcome: Progressing   Problem: Skin Integrity: Goal: Demonstration of wound healing without infection will improve Outcome: Progressing   Problem: Urinary Elimination: Goal: Ability to avoid or minimize complications of infection will improve Outcome: Progressing Goal: Ability to achieve and maintain urine output will improve Outcome: Progressing Goal: Home care management will improve Outcome: Progressing   Problem: Fluid Volume: Goal: Hemodynamic stability will improve Outcome: Progressing   Problem: Clinical Measurements: Goal: Diagnostic test results will improve Outcome: Progressing Goal: Signs and symptoms of infection will decrease Outcome: Progressing   Problem: Respiratory: Goal: Ability to maintain adequate ventilation will improve Outcome: Progressing   Problem: Education: Goal: Knowledge of General Education information will improve Description: Including pain rating scale, medication(s)/side effects and non-pharmacologic comfort measures Outcome: Progressing   Problem: Health Behavior/Discharge Planning: Goal: Ability to manage health-related needs will improve Outcome: Progressing   Problem: Clinical Measurements: Goal: Ability to maintain clinical measurements within normal limits will improve Outcome: Progressing Goal: Will remain free from infection Outcome: Progressing Goal: Diagnostic test results will improve Outcome: Progressing Goal: Respiratory complications will improve Outcome: Progressing Goal: Cardiovascular complication will be avoided Outcome: Progressing   Problem: Activity: Goal: Risk for activity intolerance will  decrease Outcome: Progressing   Problem: Nutrition: Goal: Adequate nutrition will be maintained Outcome: Progressing   Problem: Coping: Goal: Level of anxiety will decrease Outcome: Progressing   Problem: Elimination: Goal: Will not experience complications related to bowel motility Outcome: Progressing Goal: Will not experience complications related to urinary retention Outcome: Progressing   Problem: Pain Managment: Goal: General experience of comfort will improve and/or be controlled Outcome: Progressing   Problem: Safety: Goal: Ability to remain free from injury will improve Outcome: Progressing   Problem: Skin Integrity: Goal: Risk for impaired skin integrity will decrease Outcome: Progressing

## 2024-01-10 DIAGNOSIS — R7881 Bacteremia: Secondary | ICD-10-CM

## 2024-01-10 DIAGNOSIS — I10 Essential (primary) hypertension: Secondary | ICD-10-CM

## 2024-01-10 DIAGNOSIS — B9620 Unspecified Escherichia coli [E. coli] as the cause of diseases classified elsewhere: Secondary | ICD-10-CM

## 2024-01-10 DIAGNOSIS — N179 Acute kidney failure, unspecified: Secondary | ICD-10-CM

## 2024-01-10 LAB — BASIC METABOLIC PANEL WITH GFR
Anion gap: 7 (ref 5–15)
BUN: 20 mg/dL (ref 8–23)
CO2: 24 mmol/L (ref 22–32)
Calcium: 8.2 mg/dL — ABNORMAL LOW (ref 8.9–10.3)
Chloride: 105 mmol/L (ref 98–111)
Creatinine, Ser: 1.11 mg/dL (ref 0.61–1.24)
GFR, Estimated: >60 mL/min (ref >60)
Glucose, Bld: 84 mg/dL (ref 70–99)
Potassium: 3.4 mmol/L — ABNORMAL LOW (ref 3.5–5.1)
Sodium: 136 mmol/L (ref 135–145)

## 2024-01-10 LAB — CBC
HCT: 23 % — ABNORMAL LOW (ref 39.0–52.0)
Hemoglobin: 7.2 g/dL — ABNORMAL LOW (ref 13.0–17.0)
MCH: 29 pg (ref 26.0–34.0)
MCHC: 31.3 g/dL (ref 30.0–36.0)
MCV: 92.7 fL (ref 80.0–100.0)
Platelets: 573 K/uL — ABNORMAL HIGH (ref 150–400)
RBC: 2.48 MIL/uL — ABNORMAL LOW (ref 4.22–5.81)
RDW: 13.2 % (ref 11.5–15.5)
WBC: 10 K/uL (ref 4.0–10.5)
nRBC: 0 % (ref 0.0–0.2)

## 2024-01-10 MED ORDER — AMLODIPINE BESYLATE 10 MG PO TABS
10.0000 mg | ORAL_TABLET | Freq: Every day | ORAL | Status: DC
  Administered 2024-01-10 – 2024-01-12 (×5): 10 mg via ORAL
  Filled 2024-01-10 (×3): qty 1

## 2024-01-10 MED ORDER — HYDROCHLOROTHIAZIDE 25 MG PO TABS
25.0000 mg | ORAL_TABLET | Freq: Every day | ORAL | Status: DC
  Administered 2024-01-10 – 2024-01-12 (×5): 25 mg via ORAL
  Filled 2024-01-10 (×3): qty 1

## 2024-01-10 MED ORDER — AMOXICILLIN-POT CLAVULANATE 875-125 MG PO TABS
1.0000 | ORAL_TABLET | Freq: Two times a day (BID) | ORAL | Status: DC
  Administered 2024-01-10 – 2024-01-12 (×9): 1 via ORAL
  Filled 2024-01-10 (×5): qty 1

## 2024-01-10 MED ORDER — LOSARTAN POTASSIUM 50 MG PO TABS
100.0000 mg | ORAL_TABLET | Freq: Every day | ORAL | Status: DC
  Administered 2024-01-10 – 2024-01-12 (×5): 100 mg via ORAL
  Filled 2024-01-10 (×3): qty 2

## 2024-01-10 MED ORDER — LOSARTAN POTASSIUM-HCTZ 100-25 MG PO TABS: 1.0000 | ORAL_TABLET | Freq: Every day | ORAL | Status: DC

## 2024-01-10 MED ORDER — POTASSIUM CHLORIDE CRYS ER 20 MEQ PO TBCR
20.0000 meq | EXTENDED_RELEASE_TABLET | Freq: Once | ORAL | Status: AC
  Administered 2024-01-10 (×2): 20 meq via ORAL
  Filled 2024-01-10: qty 1

## 2024-01-10 NOTE — Progress Notes (Signed)
 IR TG drain removed at bedside by urology (minimal output). IR will be removed from patient rounding teams.

## 2024-01-10 NOTE — Plan of Care (Signed)
  Problem: Bowel/Gastric: Goal: Gastrointestinal status for postoperative course will improve Outcome: Progressing   Problem: Pain Management: Goal: General experience of comfort will improve Outcome: Progressing   Problem: Urinary Elimination: Goal: Ability to avoid or minimize complications of infection will improve Outcome: Progressing Goal: Ability to achieve and maintain urine output will improve Outcome: Progressing   Problem: Fluid Volume: Goal: Hemodynamic stability will improve Outcome: Progressing   Problem: Clinical Measurements: Goal: Diagnostic test results will improve Outcome: Progressing Goal: Signs and symptoms of infection will decrease Outcome: Progressing   Problem: Clinical Measurements: Goal: Ability to maintain clinical measurements within normal limits will improve Outcome: Progressing Goal: Will remain free from infection Outcome: Progressing Goal: Diagnostic test results will improve Outcome: Progressing Goal: Respiratory complications will improve Outcome: Progressing   Problem: Activity: Goal: Risk for activity intolerance will decrease Outcome: Progressing   Problem: Elimination: Goal: Will not experience complications related to bowel motility Outcome: Progressing Goal: Will not experience complications related to urinary retention Outcome: Progressing

## 2024-01-10 NOTE — Progress Notes (Addendum)
 2 Days Post-Op Subjective: Pain well controlled. No nausea or emesis. Passing flatus, some loose stools. Tolerating clears. Afebrile.   Objective: Vital signs in last 24 hours: Temp:  [97.8 F (36.6 C)-98.4 F (36.9 C)] 98.2 F (36.8 C) (08/12 0800) Pulse Rate:  [52-70] 56 (08/12 1054) Resp:  [11-18] 15 (08/12 1054) BP: (156-181)/(68-128) 175/85 (08/12 0700) SpO2:  [97 %-100 %] 99 % (08/12 1054)  Intake/Output from previous day: 08/11 0701 - 08/12 0700 In: -  Out: 1510 [Urine:1300; Drains:210] Intake/Output this shift: No intake/output data recorded. UOP: 1.5L JP: (about every 12 hours) - ss  Physical Exam:  General: Alert and oriented CV: RRR Lungs: Clear Abdomen: Soft, ND, ATTP; inc c/d/I Ext: NT, No erythema  Lab Results: Recent Labs    01/08/24 0300 01/09/24 0255 01/10/24 0312  HGB 7.5* 7.7* 7.2*  HCT 23.5* 24.3* 23.0*   BMET Recent Labs    01/09/24 0255 01/10/24 0312  NA 140 136  K 3.8 3.4*  CL 104 105  CO2 22 24  GLUCOSE 97 84  BUN 24* 20  CREATININE 1.19 1.11  CALCIUM  8.6* 8.2*     Studies/Results: No results found.  Assessment/Plan: Prostate cancer s/p robotic assisted laparoscopic radical prostatectomy and bilateral lymph node dissection on 12/26/2023 with [id number] GG2 disease Posterior vesicourethral anastomotic leak: S/p IR drain 8/9, S/p cysto, b/l stents, robotic vesicourethral anastomotic repair 01/08/2024 Bacteremia: Blood culture 8/6 with enterobacterales and E coli. Repeat blood cultures negative. Urine culture 8/8 <10K insignificant growth. IR drain culture NG. On ceftriaxone .   -Foley to gravity -JP to bulb. Putting out every 12 hours. Will check JP creatinine prior to discharge. -Transgluteal drain with minimal output and removed at bedside -Transitioned to oral antibiotics -Keep in house today. Would like him to tolerate diet prior to discharge. He does not want to advance diet yet. -Planning for cystogram in  about 2 weeks   LOS: 4 days   Matt R. Betania Dizon MD 01/10/2024, 12:05 PM [facility]  Pager: [phone]

## 2024-01-10 NOTE — Progress Notes (Signed)
 Progress Note    Ralph Brouwer   FMW:[id number]  DOB: 01/06/1961  DOA: 01/06/2024     4 PCP: Delbert Clam, MD  Initial CC: positive cultures  Hospital Course: Delmas Faucett is a 63 y.o. year old patient with prostate cancer who underwent RALP with b/l PLND on 12/26/2023 without complication and discharged on 12/27/2023.  Patient presented to our facility 01/06/2024 after previous blood cultures reported positive for E. coli.   Prior hospital course: after previous procedure patient had notable pain at home with worsening Foley drainage, nausea, vomiting. He was evaluated 7/30 by urology who replaced Foley which appears to have been poorly positioned after discharge.  1 week later on 8/6 patient had syncopal event at home with abdominal pain found to be hypotensive/hypovolemic in the ED but improved with bolus.  CT during this evaluation noted fluid collection in the pelvis which appears to have increased in size since prior hospitalization.   Assessment & Plan:   Principal Problem:   Sepsis secondary to UTI (HCC)   E. coli bacteremia Sepsis, POA - Source likely urogenital given recent surgery evaluation and Foley being previously dislodged - Robotic-assisted laparoscopic vesicourethral anastomotic repair 8/10 tolerated well - has been on rocephin ; sensitivites reviewed, will de-escalate to augmentin  (to still cover for anaerobes while awaiting pelvic culture further growth)   Prostate cancer status post robotic assisted laparoscopic radical prostatectomy with bilateral lymph node dissection 12/06/2023 -Urology following, noted [id number] GG2 disease   Pelvic fluid collection -Unspecified, concern for possible urine leak, ascites versus underlying infection - Fluid drainage/tube placement 8/9 per radiology -culture with rare GNR so far - s/p repair of vesicular urethral anastomotic leak 01/08/2024 per urology, bilateral ureteral stent placement     AKI -s/p IVF; continue diet  advancement per urology HTN - rebounding; resume home regimen CAD - resume ARB; continue statin HLD - hold Evolucomab while in hospital, continue statin    Interval History:  No events overnight. Wife present bedside. Drains in place and tolerating well. Goal is still diet advancement prior to discharge.    Old records reviewed in assessment of this patient  Antimicrobials: Rocephin  8/8 >8/11 Augmentin  8/12 >> current   DVT prophylaxis:  SCDs Start: 01/06/24 1546   Code Status:   Code Status: Full Code  Mobility Assessment (Last 72 Hours)     Mobility Assessment     Row Name 01/10/24 0800 01/09/24 2000 01/09/24 0800 01/08/24 1349 01/07/24 2000   Does the patient have exclusion criteria? No - Perform mobility assessment No - Perform mobility assessment No - Perform mobility assessment No - Perform mobility assessment No - Perform mobility assessment   What is the highest level of mobility based on the mobility assessment? Level 4 (Ambulates with assistance) - Balance while stepping forward/back - Complete Level 4 (Ambulates with assistance) - Balance while stepping forward/back - Complete Level 4 (Ambulates with assistance) - Balance while stepping forward/back - Complete Level 4 (Ambulates with assistance) - Balance while stepping forward/back - Complete Level 4 (Ambulates with assistance) - Balance while stepping forward/back - Complete      Barriers to discharge:  Disposition Plan:  Home HH orders placed:  Status is: Inpt  Objective: Blood pressure (!) 171/79, pulse 75, temperature 98.2 F (36.8 C), temperature source Oral, resp. rate 15, height 5' 8 (1.727 m), weight 69.2 kg, SpO2 98%.  Examination:  Physical Exam Constitutional:      General: He is not in acute distress.    Appearance: Normal appearance.  HENT:     Head: Normocephalic and atraumatic.     Mouth/Throat:     Mouth: Mucous membranes are moist.  Eyes:     Extraocular Movements: Extraocular movements  intact.  Cardiovascular:     Rate and Rhythm: Normal rate and regular rhythm.  Pulmonary:     Effort: Pulmonary effort is normal. No respiratory distress.     Breath sounds: Normal breath sounds. No wheezing.  Abdominal:     General: Bowel sounds are normal. There is no distension.     Palpations: Abdomen is soft.     Tenderness: There is no abdominal tenderness.  Musculoskeletal:        General: Normal range of motion.     Cervical back: Normal range of motion and neck supple.  Skin:    General: Skin is warm and dry.  Neurological:     General: No focal deficit present.     Mental Status: He is alert.  Psychiatric:        Mood and Affect: Mood normal.        Behavior: Behavior normal.      Consultants:  Urology  Procedures:  01/08/24: Procedure(s): 1.  Cystoscopy 2.  Bilateral retrograde pyelogram with interpretation 3.  Bilateral ureteral stent placement 4. Fluoroscopy <1 hour with intraoperative interpretation 5.  Lysis of adhesions 6.  Robotic-assisted laparoscopic vesicourethral anastomotic repair  Data Reviewed: Results for orders placed or performed during the hospital encounter of 01/06/24 (from the past 24 hours)  CBC     Status: Abnormal   Collection Time: 01/10/24  3:12 AM  Result Value Ref Range   WBC 10.0 4.0 - 10.5 K/uL   RBC 2.48 (L) 4.22 - 5.81 MIL/uL   Hemoglobin 7.2 (L) 13.0 - 17.0 g/dL   HCT 76.9 (L) 60.9 - 47.9 %   MCV 92.7 80.0 - 100.0 fL   MCH 29.0 26.0 - 34.0 pg   MCHC 31.3 30.0 - 36.0 g/dL   RDW 86.7 88.4 - 84.4 %   Platelets 573 (H) 150 - 400 K/uL   nRBC 0.0 0.0 - 0.2 %  Basic metabolic panel with GFR     Status: Abnormal   Collection Time: 01/10/24  3:12 AM  Result Value Ref Range   Sodium 136 135 - 145 mmol/L   Potassium 3.4 (L) 3.5 - 5.1 mmol/L   Chloride 105 98 - 111 mmol/L   CO2 24 22 - 32 mmol/L   Glucose, Bld 84 70 - 99 mg/dL   BUN 20 8 - 23 mg/dL   Creatinine, Ser 8.88 0.61 - 1.24 mg/dL   Calcium  8.2 (L) 8.9 - 10.3 mg/dL    GFR, Estimated >39 >39 mL/min   Anion gap 7 5 - 15    I have reviewed pertinent nursing notes, vitals, labs, and images as necessary. I have ordered labwork to follow up on as indicated.  I have reviewed the last notes from staff over past 24 hours. I have discussed patient's care plan and test results with nursing staff, CM/SW, and other staff as appropriate.  Time spent: Greater than 50% of the 55 minute visit was spent in counseling/coordination of care for the patient as laid out in the A&P.   LOS: 4 days   Alm Apo, MD [facility] 01/10/2024, 1:12 PM

## 2024-01-10 NOTE — Plan of Care (Signed)
  Problem: Education: Goal: Knowledge of the procedure and recovery process will improve Outcome: Progressing   Problem: Bowel/Gastric: Goal: Gastrointestinal status for postoperative course will improve Outcome: Progressing   Problem: Pain Management: Goal: General experience of comfort will improve Outcome: Progressing   Problem: Skin Integrity: Goal: Demonstration of wound healing without infection will improve Outcome: Progressing   Problem: Urinary Elimination: Goal: Ability to avoid or minimize complications of infection will improve Outcome: Progressing Goal: Ability to achieve and maintain urine output will improve Outcome: Progressing Goal: Home care management will improve Outcome: Progressing   Problem: Fluid Volume: Goal: Hemodynamic stability will improve Outcome: Progressing   Problem: Clinical Measurements: Goal: Diagnostic test results will improve Outcome: Progressing Goal: Signs and symptoms of infection will decrease Outcome: Progressing   Problem: Respiratory: Goal: Ability to maintain adequate ventilation will improve Outcome: Progressing   Problem: Education: Goal: Knowledge of General Education information will improve Description: Including pain rating scale, medication(s)/side effects and non-pharmacologic comfort measures Outcome: Progressing   Problem: Health Behavior/Discharge Planning: Goal: Ability to manage health-related needs will improve Outcome: Progressing   Problem: Clinical Measurements: Goal: Ability to maintain clinical measurements within normal limits will improve Outcome: Progressing Goal: Will remain free from infection Outcome: Progressing Goal: Diagnostic test results will improve Outcome: Progressing Goal: Respiratory complications will improve Outcome: Progressing Goal: Cardiovascular complication will be avoided Outcome: Progressing   Problem: Activity: Goal: Risk for activity intolerance will  decrease Outcome: Progressing   Problem: Nutrition: Goal: Adequate nutrition will be maintained Outcome: Progressing   Problem: Coping: Goal: Level of anxiety will decrease Outcome: Progressing   Problem: Elimination: Goal: Will not experience complications related to bowel motility Outcome: Progressing Goal: Will not experience complications related to urinary retention Outcome: Progressing   Problem: Pain Managment: Goal: General experience of comfort will improve and/or be controlled Outcome: Progressing   Problem: Safety: Goal: Ability to remain free from injury will improve Outcome: Progressing   Problem: Skin Integrity: Goal: Risk for impaired skin integrity will decrease Outcome: Progressing

## 2024-01-11 LAB — CBC
HCT: 27.4 % — ABNORMAL LOW (ref 39.0–52.0)
Hemoglobin: 8.6 g/dL — ABNORMAL LOW (ref 13.0–17.0)
MCH: 29.6 pg (ref 26.0–34.0)
MCHC: 31.4 g/dL (ref 30.0–36.0)
MCV: 94.2 fL (ref 80.0–100.0)
Platelets: 707 K/uL — ABNORMAL HIGH (ref 150–400)
RBC: 2.91 MIL/uL — ABNORMAL LOW (ref 4.22–5.81)
RDW: 13.2 % (ref 11.5–15.5)
WBC: 12.3 K/uL — ABNORMAL HIGH (ref 4.0–10.5)
nRBC: 0 % (ref 0.0–0.2)

## 2024-01-11 LAB — BODY FLUID CULTURE W GRAM STAIN: Culture: NO GROWTH

## 2024-01-11 LAB — BASIC METABOLIC PANEL WITH GFR
Anion gap: 9 (ref 5–15)
BUN: 17 mg/dL (ref 8–23)
CO2: 27 mmol/L (ref 22–32)
Calcium: 8.9 mg/dL (ref 8.9–10.3)
Chloride: 99 mmol/L (ref 98–111)
Creatinine, Ser: 1.06 mg/dL (ref 0.61–1.24)
GFR, Estimated: >60 mL/min (ref >60)
Glucose, Bld: 115 mg/dL — ABNORMAL HIGH (ref 70–99)
Potassium: 3.7 mmol/L (ref 3.5–5.1)
Sodium: 135 mmol/L (ref 135–145)

## 2024-01-11 LAB — CULTURE, BLOOD (ROUTINE X 2)
Culture: NO GROWTH
Culture: NO GROWTH
Special Requests: ADEQUATE
Special Requests: ADEQUATE

## 2024-01-11 MED ORDER — BOOST / RESOURCE BREEZE PO LIQD CUSTOM
1.0000 | Freq: Three times a day (TID) | ORAL | Status: DC
  Administered 2024-01-11 – 2024-01-12 (×7): 1 via ORAL

## 2024-01-11 NOTE — Progress Notes (Signed)
 Progress Note    [name]   FMW:[id number]  DOB: November 22, 1960  DOA: 01/06/2024     5 PCP: Delbert Clam, MD  Initial CC: positive cultures  Hospital Course: [name] is a 63 y.o. year old patient with prostate cancer who underwent RALP with b/l PLND on 12/26/2023 without complication and discharged on 12/27/2023.  Patient presented to our facility 01/06/2024 after previous blood cultures reported positive for E. coli.   Prior hospital course: after previous procedure patient had notable pain at home with worsening Foley drainage, nausea, vomiting. He was evaluated 7/30 by urology who replaced Foley which appears to have been poorly positioned after discharge.  1 week later on 8/6 patient had syncopal event at home with abdominal pain found to be hypotensive/hypovolemic in the ED but improved with bolus.  CT during this evaluation noted fluid collection in the pelvis which appears to have increased in size since prior hospitalization.   Assessment & Plan:   E. coli bacteremia Sepsis, POA - Source likely urogenital given recent surgery evaluation and Foley being previously dislodged - Robotic-assisted laparoscopic vesicourethral anastomotic repair 8/10 tolerated well - has been on rocephin ; sensitivites reviewed, will de-escalate to augmentin  (to still cover for anaerobes while awaiting pelvic culture further growth)  Pelvic fluid collection -Unspecified, concern for possible urine leak, ascites versus underlying infection - Fluid drainage/tube placement 8/9 per radiology -culture with rare GNR so far - s/p repair of vesicular urethral anastomotic leak 01/08/2024 per urology, bilateral ureteral stent placement  Prostate cancer status post robotic assisted laparoscopic radical prostatectomy with bilateral lymph node dissection 12/06/2023 -Urology following, noted [id number] GG2 disease   AKI -s/p IVF; continue diet advancement per urology HTN - rebounding; resume home  regimen CAD - resume ARB; continue statin HLD - hold Evolucomab while in hospital, continue statin  Interval History:  Resting comfortably in bed this morning.  Wife present bedside. Remains on clear liquids.  Encouraged him to consider diet advancement today.    Old records reviewed in assessment of this patient  Antimicrobials: Rocephin  8/8 >8/11 Augmentin  8/12 >> current   DVT prophylaxis:  SCDs Start: 01/06/24 1546   Code Status:   Code Status: Full Code  Mobility Assessment (Last 72 Hours)     Mobility Assessment     Row Name 01/10/24 2000 01/10/24 0800 01/09/24 2000 01/09/24 0800     Does the patient have exclusion criteria? No - Perform mobility assessment No - Perform mobility assessment No - Perform mobility assessment No - Perform mobility assessment    What is the highest level of mobility based on the mobility assessment? Level 5 (Ambulates independently) - Balance while walking independently - Complete Level 4 (Ambulates with assistance) - Balance while stepping forward/back - Complete Level 4 (Ambulates with assistance) - Balance while stepping forward/back - Complete Level 4 (Ambulates with assistance) - Balance while stepping forward/back - Complete       Barriers to discharge:  Disposition Plan:  Home HH orders placed:  Status is: Inpt  Objective: Blood pressure (!) 149/83, pulse 60, temperature 98.1 F (36.7 C), temperature source Oral, resp. rate 15, height 5' 8 (1.727 m), weight 69.2 kg, SpO2 100%.  Examination:  Physical Exam Constitutional:      General: He is not in acute distress.    Appearance: Normal appearance.  HENT:     Head: Normocephalic and atraumatic.     Mouth/Throat:     Mouth: Mucous membranes are moist.  Eyes:     Extraocular  Movements: Extraocular movements intact.  Cardiovascular:     Rate and Rhythm: Normal rate and regular rhythm.  Pulmonary:     Effort: Pulmonary effort is normal. No respiratory distress.     Breath  sounds: Normal breath sounds. No wheezing.  Abdominal:     General: Bowel sounds are normal. There is no distension.     Palpations: Abdomen is soft.     Tenderness: There is no abdominal tenderness.     Comments: Pelvic JP drain in place  Genitourinary:    Comments: Foley in place Musculoskeletal:        General: Normal range of motion.     Cervical back: Normal range of motion and neck supple.  Skin:    General: Skin is warm and dry.  Neurological:     General: No focal deficit present.     Mental Status: He is alert.  Psychiatric:        Mood and Affect: Mood normal.        Behavior: Behavior normal.      Consultants:  Urology  Procedures:  01/08/24: Procedure(s): 1.  Cystoscopy 2.  Bilateral retrograde pyelogram with interpretation 3.  Bilateral ureteral stent placement 4. Fluoroscopy <1 hour with intraoperative interpretation 5.  Lysis of adhesions 6.  Robotic-assisted laparoscopic vesicourethral anastomotic repair  Data Reviewed: Results for orders placed or performed during the hospital encounter of 01/06/24 (from the past 24 hours)  CBC     Status: Abnormal   Collection Time: 01/11/24  3:16 AM  Result Value Ref Range   WBC 12.3 (H) 4.0 - 10.5 K/uL   RBC 2.91 (L) 4.22 - 5.81 MIL/uL   Hemoglobin 8.6 (L) 13.0 - 17.0 g/dL   HCT 72.5 (L) 60.9 - 47.9 %   MCV 94.2 80.0 - 100.0 fL   MCH 29.6 26.0 - 34.0 pg   MCHC 31.4 30.0 - 36.0 g/dL   RDW 86.7 88.4 - 84.4 %   Platelets 707 (H) 150 - 400 K/uL   nRBC 0.0 0.0 - 0.2 %  Basic metabolic panel with GFR     Status: Abnormal   Collection Time: 01/11/24  3:16 AM  Result Value Ref Range   Sodium 135 135 - 145 mmol/L   Potassium 3.7 3.5 - 5.1 mmol/L   Chloride 99 98 - 111 mmol/L   CO2 27 22 - 32 mmol/L   Glucose, Bld 115 (H) 70 - 99 mg/dL   BUN 17 8 - 23 mg/dL   Creatinine, Ser 8.93 0.61 - 1.24 mg/dL   Calcium  8.9 8.9 - 10.3 mg/dL   GFR, Estimated >39 >39 mL/min   Anion gap 9 5 - 15    I have reviewed pertinent  nursing notes, vitals, labs, and images as necessary. I have ordered labwork to follow up on as indicated.  I have reviewed the last notes from staff over past 24 hours. I have discussed patient's care plan and test results with nursing staff, CM/SW, and other staff as appropriate.  Time spent: Greater than 50% of the 55 minute visit was spent in counseling/coordination of care for the patient as laid out in the A&P.   LOS: 5 days   Alm Apo, MD [facility] 01/11/2024, 2:32 PM

## 2024-01-11 NOTE — Progress Notes (Signed)
 3 Days Post-Op Subjective: Pain controlled. No nausea or emesis. Passing flatus, no recent BM. Tolerating clears, not ready for regular food yet.  Objective: Vital signs in last 24 hours: Temp:  [98 F (36.7 C)-98.8 F (37.1 C)] 98.8 F (37.1 C) (08/13 0800) Pulse Rate:  [56-94] 65 (08/13 0200) Resp:  [13-22] 15 (08/13 0200) BP: (129-171)/(64-94) 144/94 (08/13 0200) SpO2:  [97 %-100 %] 98 % (08/13 0200)  Intake/Output from previous day: 08/12 0701 - 08/13 0700 In: 240 [P.O.:240] Out: 1380 [Urine:1350; Drains:30] Intake/Output this shift: Total I/O In: -  Out: 20 [Drains:20] UOP: 1.3L JP: 30ml serous  Physical Exam:  General: Alert and oriented CV: RRR Lungs: Clear Abdomen: Soft, ND, ATTP; inc c/d/I Ext: NT, No erythema  Lab Results: Recent Labs    01/09/24 0255 01/10/24 0312 01/11/24 0316  HGB 7.7* 7.2* 8.6*  HCT 24.3* 23.0* 27.4*   BMET Recent Labs    01/10/24 0312 01/11/24 0316  NA 136 135  K 3.4* 3.7  CL 105 99  CO2 24 27  GLUCOSE 84 115*  BUN 20 17  CREATININE 1.11 1.06  CALCIUM  8.2* 8.9     Studies/Results: No results found.  Assessment/Plan: Prostate cancer s/p robotic assisted laparoscopic radical prostatectomy and bilateral lymph node dissection on 12/26/2023 with [id number] GG2 disease Posterior vesicourethral anastomotic leak: S/p IR drain 8/9, S/p cysto, b/l stents, robotic vesicourethral anastomotic repair 01/08/2024 Bacteremia: Blood culture 8/6 with enterobacterales and E coli. Repeat blood cultures negative. Urine culture 8/8 <10K insignificant growth. IR drain culture NG. On ceftriaxone .   -Foley to gravity -JP to bulb. Now with only 30ml recorded since yesterday and <34ml in bulb. Will check JP creatinine today. Will likely discharge home with JP in place -Transitioned to oral antibiotics -Planning for cystogram in about 2 weeks -He does not yet want to advance to regular diet. Planning discharge home soon.   LOS: 5 days   [name]  R. Kimberley Speece MD 01/11/2024, 9:05 AM [facility]  Pager: [phone]

## 2024-01-12 ENCOUNTER — Other Ambulatory Visit (HOSPITAL_COMMUNITY): Payer: Self-pay

## 2024-01-12 LAB — BASIC METABOLIC PANEL WITH GFR
Anion gap: 8 (ref 5–15)
BUN: 14 mg/dL (ref 8–23)
CO2: 27 mmol/L (ref 22–32)
Calcium: 8.5 mg/dL — ABNORMAL LOW (ref 8.9–10.3)
Chloride: 96 mmol/L — ABNORMAL LOW (ref 98–111)
Creatinine, Ser: 1.04 mg/dL (ref 0.61–1.24)
GFR, Estimated: >60 mL/min (ref >60)
Glucose, Bld: 105 mg/dL — ABNORMAL HIGH (ref 70–99)
Potassium: 3.9 mmol/L (ref 3.5–5.1)
Sodium: 131 mmol/L — ABNORMAL LOW (ref 135–145)

## 2024-01-12 LAB — CBC
HCT: 25.2 % — ABNORMAL LOW (ref 39.0–52.0)
Hemoglobin: 8.1 g/dL — ABNORMAL LOW (ref 13.0–17.0)
MCH: 29.8 pg (ref 26.0–34.0)
MCHC: 32.1 g/dL (ref 30.0–36.0)
MCV: 92.6 fL (ref 80.0–100.0)
Platelets: 666 K/uL — ABNORMAL HIGH (ref 150–400)
RBC: 2.72 MIL/uL — ABNORMAL LOW (ref 4.22–5.81)
RDW: 13.4 % (ref 11.5–15.5)
WBC: 9.2 K/uL (ref 4.0–10.5)
nRBC: 0 % (ref 0.0–0.2)

## 2024-01-12 LAB — CREATININE, FLUID (PLEURAL, PERITONEAL, JP DRAINAGE): Creat, Fluid: 13.4 mg/dL

## 2024-01-12 MED ORDER — AMOXICILLIN-POT CLAVULANATE 875-125 MG PO TABS
1.0000 | ORAL_TABLET | Freq: Two times a day (BID) | ORAL | 0 refills | Status: AC
  Filled 2024-01-12: qty 6, 3d supply, fill #0

## 2024-01-12 NOTE — Progress Notes (Signed)
 Discharge medication delivered to patient at bedside [facility]

## 2024-01-12 NOTE — Plan of Care (Signed)
  Problem: Education: Goal: Knowledge of the procedure and recovery process will improve Outcome: Adequate for Discharge   Problem: Bowel/Gastric: Goal: Gastrointestinal status for postoperative course will improve Outcome: Adequate for Discharge   Problem: Pain Management: Goal: General experience of comfort will improve Outcome: Adequate for Discharge   Problem: Skin Integrity: Goal: Demonstration of wound healing without infection will improve Outcome: Adequate for Discharge   Problem: Urinary Elimination: Goal: Ability to avoid or minimize complications of infection will improve Outcome: Adequate for Discharge Goal: Ability to achieve and maintain urine output will improve Outcome: Adequate for Discharge Goal: Home care management will improve Outcome: Adequate for Discharge   Problem: Fluid Volume: Goal: Hemodynamic stability will improve Outcome: Adequate for Discharge   Problem: Clinical Measurements: Goal: Diagnostic test results will improve Outcome: Adequate for Discharge Goal: Signs and symptoms of infection will decrease Outcome: Adequate for Discharge   Problem: Respiratory: Goal: Ability to maintain adequate ventilation will improve Outcome: Adequate for Discharge   Problem: Education: Goal: Knowledge of General Education information will improve Description: Including pain rating scale, medication(s)/side effects and non-pharmacologic comfort measures Outcome: Adequate for Discharge   Problem: Health Behavior/Discharge Planning: Goal: Ability to manage health-related needs will improve Outcome: Adequate for Discharge   Problem: Clinical Measurements: Goal: Ability to maintain clinical measurements within normal limits will improve Outcome: Adequate for Discharge Goal: Will remain free from infection Outcome: Adequate for Discharge Goal: Diagnostic test results will improve Outcome: Adequate for Discharge Goal: Respiratory complications will  improve Outcome: Adequate for Discharge Goal: Cardiovascular complication will be avoided Outcome: Adequate for Discharge   Problem: Activity: Goal: Risk for activity intolerance will decrease Outcome: Adequate for Discharge   Problem: Nutrition: Goal: Adequate nutrition will be maintained Outcome: Adequate for Discharge   Problem: Coping: Goal: Level of anxiety will decrease Outcome: Adequate for Discharge   Problem: Elimination: Goal: Will not experience complications related to bowel motility Outcome: Adequate for Discharge Goal: Will not experience complications related to urinary retention Outcome: Adequate for Discharge   Problem: Pain Managment: Goal: General experience of comfort will improve and/or be controlled Outcome: Adequate for Discharge   Problem: Safety: Goal: Ability to remain free from injury will improve Outcome: Adequate for Discharge   Problem: Skin Integrity: Goal: Risk for impaired skin integrity will decrease Outcome: Adequate for Discharge

## 2024-01-12 NOTE — Progress Notes (Signed)
 Pt educated on how to empty JP drain and recharge suction, as well as possible complications. Pt and wife demonstrate understanding.

## 2024-01-12 NOTE — Progress Notes (Signed)
 4 Days Post-Op Subjective: Pain controlled. No nausea or emesis. Passing flatus, having BMs. Wants to try regular food.  Objective: Vital signs in last 24 hours: Temp:  [98.1 F (36.7 C)-98.8 F (37.1 C)] 98.6 F (37 C) (08/13 2251) Pulse Rate:  [56-80] 74 (08/14 0300) Resp:  [13-25] 15 (08/14 0300) BP: (122-154)/(71-83) 134/79 (08/14 0300) SpO2:  [91 %-100 %] 96 % (08/14 0300)  Intake/Output from previous day: 08/13 0701 - 08/14 0700 In: -  Out: 1620 [Urine:1600; Drains:20] Intake/Output this shift: No intake/output data recorded. UOP: 1.6L JP: 20ml serous  Physical Exam:  General: Alert and oriented CV: RRR Lungs: Clear Abdomen: Soft, ND, ATTP; inc c/d/I Ext: NT, No erythema  Lab Results: Recent Labs    01/10/24 0312 01/11/24 0316 01/12/24 0310  HGB 7.2* 8.6* 8.1*  HCT 23.0* 27.4* 25.2*   BMET Recent Labs    01/11/24 0316 01/12/24 0310  NA 135 131*  K 3.7 3.9  CL 99 96*  CO2 27 27  GLUCOSE 115* 105*  BUN 17 14  CREATININE 1.06 1.04  CALCIUM  8.9 8.5*     Studies/Results: No results found.  Assessment/Plan: Prostate cancer s/p robotic assisted laparoscopic radical prostatectomy and bilateral lymph node dissection on 12/26/2023 with [id number] GG2 disease Posterior vesicourethral anastomotic leak: S/p IR drain 8/9, S/p cysto, b/l stents, robotic vesicourethral anastomotic repair 01/08/2024 Bacteremia: Blood culture 8/6 with enterobacterales and E coli. Repeat blood cultures negative. Urine culture 8/8 <10K insignificant growth. IR drain culture NG. On ceftriaxone .   -Foley to gravity -JP to bulb. Now with only 20ml recorded yesterday. Will check JP creatinine today. Will likely discharge home with JP in place -Transitioned to oral antibiotics -Planning for cystogram in about 2 weeks -Advance to regular diet. -Hopefully discharge home today   LOS: 6 days   Matt R. Henslee Lottman MD 01/12/2024, 8:00 AM [facility]  Pager: [phone]

## 2024-01-12 NOTE — Discharge Summary (Signed)
 Physician Discharge Summary   [name] FMW:[id number] DOB: 09/05/1960 DOA: 01/06/2024  PCP: Delbert Clam, MD  Admit date: 01/06/2024 Discharge date: 01/12/2024  Admitted From: Home Disposition: Home Discharging physician: Alm Apo, MD Barriers to discharge: None  Recommendations at discharge: Follow-up with urology   Discharge Condition: stable CODE STATUS: Full Diet recommendation:  Diet Orders (From admission, onward)     Start     Ordered   01/12/24 0803  Diet regular Room service appropriate? Yes; Fluid consistency: Thin  Diet effective now       Question Answer Comment  Room service appropriate? Yes   Fluid consistency: Thin      01/12/24 0802   01/12/24 0000  Diet general        01/12/24 1045            Hospital Course: [name] is a 63 y.o. year old patient with prostate cancer who underwent RALP with b/l PLND on 12/26/2023 without complication and discharged on 12/27/2023.  Patient presented to our facility 01/06/2024 after previous blood cultures reported positive for E. coli.   Prior hospital course: after previous procedure patient had notable pain at home with worsening Foley drainage, nausea, vomiting. He was evaluated 7/30 by urology who replaced Foley which appears to have been poorly positioned after discharge.  1 week later on 8/6 patient had syncopal event at home with abdominal pain found to be hypotensive/hypovolemic in the ED but improved with bolus.  CT during this evaluation noted fluid collection in the pelvis which appears to have increased in size since prior hospitalization.   Assessment & Plan:   E. coli bacteremia Sepsis, POA - Source likely urogenital given recent surgery evaluation and Foley being previously dislodged - Robotic-assisted laparoscopic vesicourethral anastomotic repair 8/10 tolerated well - has been on rocephin ; sensitivites reviewed, will de-escalate to augmentin  (to still cover for anaerobes while awaiting  pelvic culture further growth); continued on total of 7-day course postop  Pelvic fluid collection -Unspecified, concern for possible urine leak, ascites versus underlying infection - Fluid drainage/tube placement 8/9 per radiology -culture with rare GNR so far - s/p repair of vesicular urethral anastomotic leak 01/08/2024 per urology, bilateral ureteral stent placement  Prostate cancer status post robotic assisted laparoscopic radical prostatectomy with bilateral lymph node dissection 12/06/2023 -Urology following, noted [id number] disease   AKI -s/p IVF; tolerated regular diet prior to discharge HTN - rebounding; resume home regimen CAD - resume ARB; continue statin HLD - hold Evolucomab while in hospital, continue statin   The patient's acute and chronic medical conditions were treated accordingly. On day of discharge, patient was felt deemed stable for discharge. Patient/family member advised to call PCP or come back to ER if needed.   Principal Diagnosis: Sepsis secondary to UTI [facility])  Discharge Diagnoses: Active Hospital Problems   Diagnosis Date Noted   E coli bacteremia 01/10/2024    Priority: 2.   Acute kidney injury (HCC) 12/29/2023   Prostate cancer (HCC) 03/06/2019   Hyperlipidemia with target LDL less than 70 10/15/2009   Essential hypertension 10/15/2009    Resolved Hospital Problems   Diagnosis Date Noted Date Resolved   Sepsis secondary to UTI [facility]) 01/06/2024 01/10/2024    Priority: 1.     Discharge Instructions     Diet general   Complete by: As directed    Discharge wound care:   Complete by: As directed    Continue JP drain care   Increase activity slowly   Complete by:  As directed       Allergies as of 01/12/2024       Reactions   Lisinopril Swelling   lips swelling        Medication List     TAKE these medications    amLODipine  10 MG tablet Commonly known as: NORVASC  Take 1 tablet (10 mg total) by mouth daily.    amoxicillin -clavulanate 875-125 MG tablet Commonly known as: AUGMENTIN  Take 1 tablet by mouth every 12 (twelve) hours for 3 days.   diphenhydramine -acetaminophen  25-500 MG Tabs tablet Commonly known as: TYLENOL  PM Take 1 tablet by mouth at bedtime as needed (for pain,sleep).   docusate sodium  100 MG capsule Commonly known as: COLACE Take 1 capsule (100 mg total) by mouth 2 (two) times daily.   DULoxetine  60 MG capsule Commonly known as: Cymbalta  Take 1 capsule (60 mg total) by mouth daily. For chronic pain   HYDROcodone -acetaminophen  5-325 MG tablet Commonly known as: NORCO/VICODIN Take 1-2 tablets by mouth every 6 (six) hours as needed for moderate pain (pain score 4-6) or severe pain (pain score 7-10).   lactulose  10 GM/15ML solution Commonly known as: CHRONULAC  Take 15 mLs (10 g total) by mouth 3 (three) times daily.   losartan -hydrochlorothiazide  100-25 MG tablet Commonly known as: HYZAAR  Take 1 tablet by mouth daily.   nitroGLYCERIN  0.4 MG SL tablet Commonly known as: NITROSTAT  Place 1 tablet (0.4 mg total) under the tongue every 5 (five) minutes as needed for chest pain.   ranolazine  500 MG 12 hr tablet Commonly known as: RANEXA  Take 1 tablet (500 mg total) by mouth 2 (two) times daily.   Repatha  SureClick 140 MG/ML Soaj Generic drug: Evolocumab  INJECT 140 MG INTO THE SKIN EVERY 14 (FOURTEEN) DAYS.   rosuvastatin  40 MG tablet Commonly known as: CRESTOR  Take 1 tablet (40 mg total) by mouth daily.               Discharge Care Instructions  (From admission, onward)           Start     Ordered   01/12/24 0000  Discharge wound care:       Comments: Continue JP drain care   01/12/24 1045            Allergies  Allergen Reactions   Lisinopril Swelling    lips swelling    Consultations: Urology  Procedures: 01/08/24: Procedure(s): 1.  Cystoscopy 2.  Bilateral retrograde pyelogram with interpretation 3.  Bilateral ureteral stent  placement 4. Fluoroscopy <1 hour with intraoperative interpretation 5.  Lysis of adhesions 6.  Robotic-assisted laparoscopic vesicourethral anastomotic repair  Discharge Exam: BP 130/78   Pulse 72   Temp 98.6 F (37 C) (Oral)   Resp 16   Ht 5' 8 (1.727 m)   Wt 69.2 kg   SpO2 99%   BMI 23.20 kg/m  Physical Exam Constitutional:      General: He is not in acute distress.    Appearance: Normal appearance.  HENT:     Head: Normocephalic and atraumatic.     Mouth/Throat:     Mouth: Mucous membranes are moist.  Eyes:     Extraocular Movements: Extraocular movements intact.  Cardiovascular:     Rate and Rhythm: Normal rate and regular rhythm.  Pulmonary:     Effort: Pulmonary effort is normal. No respiratory distress.     Breath sounds: Normal breath sounds. No wheezing.  Abdominal:     General: Bowel sounds are normal. There is no distension.  Palpations: Abdomen is soft.     Tenderness: There is no abdominal tenderness.     Comments: Pelvic JP drain in place  Genitourinary:    Comments: Foley in place Musculoskeletal:        General: Normal range of motion.     Cervical back: Normal range of motion and neck supple.  Skin:    General: Skin is warm and dry.  Neurological:     General: No focal deficit present.     Mental Status: He is alert.  Psychiatric:        Mood and Affect: Mood normal.        Behavior: Behavior normal.      The results of significant diagnostics from this hospitalization (including imaging, microbiology, ancillary and laboratory) are listed below for reference.   Microbiology: Recent Results (from the past 240 hours)  Blood culture (routine x 2)     Status: None   Collection Time: 01/04/24 12:42 PM   Specimen: BLOOD  Result Value Ref Range Status   Specimen Description BLOOD RIGHT ANTECUBITAL  Final   Special Requests   Final    BOTTLES DRAWN AEROBIC AND ANAEROBIC Blood Culture adequate volume   Culture   Final    NO GROWTH 5  DAYS Performed at [facility], [address] [place], [place] [address]    Report Status 01/09/2024 FINAL  Final  Blood culture (routine x 2)     Status: Abnormal   Collection Time: 01/04/24  1:00 PM   Specimen: BLOOD RIGHT HAND  Result Value Ref Range Status   Specimen Description BLOOD RIGHT HAND  Final   Special Requests   Final    BOTTLES DRAWN AEROBIC AND ANAEROBIC Blood Culture adequate volume   Culture  Setup Time   Final    GRAM NEGATIVE RODS AEROBIC BOTTLE ONLY CRITICAL RESULT CALLED TO, READ BACK BY AND VERIFIED WITH: ED CHARGE RN OZELL CORDOBA [phone] AT 0959, ADC Performed at [facility], [address], [place], [place] [address]    Culture ESCHERICHIA COLI (A)  Final   Report Status 01/07/2024 FINAL  Final   Organism ID, Bacteria ESCHERICHIA COLI  Final   Organism ID, Bacteria ESCHERICHIA COLI  Final      Susceptibility   Escherichia coli - KIRBY BAUER*    CEFAZOLIN  SENSITIVE Sensitive    Escherichia coli - MIC*    AMPICILLIN <=2 SENSITIVE Sensitive     CEFEPIME <=0.12 SENSITIVE Sensitive     CEFTAZIDIME <=1 SENSITIVE Sensitive     CEFTRIAXONE  <=0.25 SENSITIVE Sensitive     CIPROFLOXACIN <=0.25 SENSITIVE Sensitive     GENTAMICIN <=1 SENSITIVE Sensitive     IMIPENEM <=0.25 SENSITIVE Sensitive     TRIMETH /SULFA  <=20 SENSITIVE Sensitive     AMPICILLIN/SULBACTAM <=2 SENSITIVE Sensitive     PIP/TAZO <=4 SENSITIVE Sensitive ug/mL    * ESCHERICHIA COLI    ESCHERICHIA COLI  Blood Culture ID Panel (Reflexed)     Status: Abnormal   Collection Time: 01/04/24  1:00 PM  Result Value Ref Range Status   Enterococcus faecalis NOT DETECTED NOT DETECTED Final   Enterococcus Faecium NOT DETECTED NOT DETECTED Final   Listeria monocytogenes NOT DETECTED NOT DETECTED Final   Staphylococcus species NOT DETECTED NOT DETECTED Final   Staphylococcus aureus (BCID) NOT DETECTED NOT DETECTED Final   Staphylococcus epidermidis NOT DETECTED NOT DETECTED Final   Staphylococcus  lugdunensis NOT DETECTED NOT DETECTED Final   Streptococcus species NOT DETECTED  NOT DETECTED Final   Streptococcus agalactiae NOT DETECTED NOT DETECTED Final   Streptococcus pneumoniae NOT DETECTED NOT DETECTED Final   Streptococcus pyogenes NOT DETECTED NOT DETECTED Final   A.calcoaceticus-baumannii NOT DETECTED NOT DETECTED Final   Bacteroides fragilis NOT DETECTED NOT DETECTED Final   Enterobacterales DETECTED (A) NOT DETECTED Final    Comment: Enterobacterales represent a large order of gram negative bacteria, not a single organism. CRITICAL RESULT CALLED TO, READ BACK BY AND VERIFIED WITH: ED CHARGE RN MICHAEL D. [phone] AT [phone], ADC    Enterobacter cloacae complex NOT DETECTED NOT DETECTED Final   Escherichia coli DETECTED (A) NOT DETECTED Final    Comment: CRITICAL RESULT CALLED TO, READ BACK BY AND VERIFIED WITH: ED CHARGE RN MICHAEL D. [phone] AT 0959, ADC    Klebsiella aerogenes NOT DETECTED NOT DETECTED Final   Klebsiella oxytoca NOT DETECTED NOT DETECTED Final   Klebsiella pneumoniae NOT DETECTED NOT DETECTED Final   Proteus species NOT DETECTED NOT DETECTED Final   Salmonella species NOT DETECTED NOT DETECTED Final   Serratia marcescens NOT DETECTED NOT DETECTED Final   Haemophilus influenzae NOT DETECTED NOT DETECTED Final   Neisseria meningitidis NOT DETECTED NOT DETECTED Final   Pseudomonas aeruginosa NOT DETECTED NOT DETECTED Final   Stenotrophomonas maltophilia NOT DETECTED NOT DETECTED Final   Candida albicans NOT DETECTED NOT DETECTED Final   Candida auris NOT DETECTED NOT DETECTED Final   Candida glabrata NOT DETECTED NOT DETECTED Final   Candida krusei NOT DETECTED NOT DETECTED Final   Candida parapsilosis NOT DETECTED NOT DETECTED Final   Candida tropicalis NOT DETECTED NOT DETECTED Final   Cryptococcus neoformans/gattii NOT DETECTED NOT DETECTED Final   CTX-M ESBL NOT DETECTED NOT DETECTED Final   Carbapenem resistance IMP NOT DETECTED NOT DETECTED Final    Carbapenem resistance KPC NOT DETECTED NOT DETECTED Final   Carbapenem resistance NDM NOT DETECTED NOT DETECTED Final   Carbapenem resist OXA 48 LIKE NOT DETECTED NOT DETECTED Final   Carbapenem resistance VIM NOT DETECTED NOT DETECTED Final    Comment: Performed at [facility], [address], [place], [place] [address]  Blood culture (routine x 2)     Status: None   Collection Time: 01/06/24 12:15 PM   Specimen: BLOOD  Result Value Ref Range Status   Specimen Description BLOOD SITE NOT SPECIFIED  Final   Special Requests   Final    BOTTLES DRAWN AEROBIC AND ANAEROBIC Blood Culture adequate volume   Culture   Final    NO GROWTH 5 DAYS Performed at [facility], [address], [place], [place] [address]    Report Status 01/11/2024 FINAL  Final  Blood culture (routine x 2)     Status: None   Collection Time: 01/06/24 12:20 PM   Specimen: BLOOD LEFT ARM  Result Value Ref Range Status   Specimen Description BLOOD LEFT ARM  Final   Special Requests   Final    BOTTLES DRAWN AEROBIC AND ANAEROBIC Blood Culture adequate volume   Culture   Final    NO GROWTH 5 DAYS Performed at [facility], [address], [place], [place] [address]    Report Status 01/11/2024 FINAL  Final  Culture, Urine (Do not remove urinary catheter, catheter placed by urology or difficult to place)     Status: Abnormal   Collection Time: 01/06/24  3:49 PM   Specimen: Urine, Catheterized  Result Value Ref Range Status   Specimen Description URINE,  CATHETERIZED  Final   Special Requests NONE  Final   Culture (A)  Final    <10,000 COLONIES/mL INSIGNIFICANT GROWTH Performed at [facility], [address] [place], [place] [address]    Report Status 01/07/2024 FINAL  Final  Body fluid culture w Gram Stain     Status: None   Collection Time: 01/07/24 12:29 PM   Specimen: Pelvis; Body Fluid  Result Value Ref Range Status   Specimen Description   Final    PELVIS Performed at [facility], [address], [place], [place] [address]    Special Requests   Final    NONE Performed at [facility], [address], [place], [place] [address]    Gram Stain   Final    ABUNDANT WBC PRESENT, PREDOMINANTLY PMN NO ORGANISMS SEEN    Culture   Final    NO GROWTH 3 DAYS Performed at [facility], [address], [place], [place] [address]    Report Status 01/11/2024 FINAL  Final  Aerobic/Anaerobic Culture w Gram Stain (surgical/deep wound)     Status: None (Preliminary result)   Collection Time: 01/07/24  1:40 PM   Specimen: Pelvis; Peritoneal Fluid  Result Value Ref Range Status   Specimen Description   Final    PELVIS Performed at [facility], [address] [place], [place] [address]    Special Requests   Final    NONE Performed at [facility], [address], [place], [place] [address]    Gram Stain   Final    MODERATE WBC PRESENT, PREDOMINANTLY PMN NO ORGANISMS SEEN    Culture   Final    RARE GRAM NEGATIVE RODS IDENTIFICATION AND SUSCEPTIBILITIES TO FOLLOW Performed at [facility], [address] [place], [place] [address]    Report Status PENDING  Incomplete     Labs: BNP (last 3 results) No results for input(s): BNP in the last 8760 hours. Basic Metabolic Panel: Recent Labs  Lab 01/08/24 0300 01/09/24 0255 01/10/24 0312 01/11/24 0316 01/12/24 0310  NA 136 140 136 135 131*  K 3.7 3.8 3.4* 3.7 3.9  CL 102 104 105 99 96*  CO2 21* 22 24 27 27   GLUCOSE 88 97 84 115* 105*  BUN 24* 24* 20 17 14   CREATININE 1.29* 1.19 1.11 1.06 1.04  CALCIUM  8.6* 8.6* 8.2* 8.9 8.5*   Liver Function Tests: Recent Labs  Lab 01/06/24 1546 01/07/24 0300  AST 19 21  ALT 13 12  ALKPHOS 49 52  BILITOT 0.8 1.2  PROT 6.4* 7.1  ALBUMIN  2.7* 2.9*   No results for input(s): LIPASE, AMYLASE in the last 168 hours. No results for input(s): AMMONIA in the last 168  hours. CBC: Recent Labs  Lab 01/06/24 1215 01/07/24 0300 01/08/24 0300 01/09/24 0255 01/10/24 0312 01/11/24 0316 01/12/24 0310  WBC 18.0*   < > 12.5* 12.7* 10.0 12.3* 9.2  NEUTROABS 16.7*  --   --   --   --   --   --   HGB 7.9*   < > 7.5* 7.7* 7.2* 8.6* 8.1*  HCT 24.6*   < > 23.5* 24.3* 23.0* 27.4* 25.2*  MCV 93.9   < > 94.4 92.0 92.7 94.2 92.6  PLT 474*   < > 481* 546* 573* 707* 666*   < > = values in this interval not displayed.   Cardiac Enzymes: No results for input(s): CKTOTAL, CKMB,  CKMBINDEX, TROPONINI in the last 168 hours. BNP: Invalid input(s): POCBNP CBG: No results for input(s): GLUCAP in the last 168 hours. D-Dimer No results for input(s): DDIMER in the last 72 hours. Hgb A1c No results for input(s): HGBA1C in the last 72 hours. Lipid Profile No results for input(s): CHOL, HDL, LDLCALC, TRIG, CHOLHDL, LDLDIRECT in the last 72 hours. Thyroid  function studies No results for input(s): TSH, T4TOTAL, T3FREE, THYROIDAB in the last 72 hours.  Invalid input(s): FREET3 Anemia work up No results for input(s): VITAMINB12, FOLATE, FERRITIN, TIBC, IRON, RETICCTPCT in the last 72 hours. Urinalysis    Component Value Date/Time   COLORURINE YELLOW 10/18/2018 1131   APPEARANCEUR CLEAR 10/18/2018 1131   LABSPEC 1.020 10/18/2018 1131   PHURINE 7.0 10/18/2018 1131   GLUCOSEU NEGATIVE 10/18/2018 1131   HGBUR NEGATIVE 10/18/2018 1131   BILIRUBINUR NEGATIVE 10/18/2018 1131   KETONESUR NEGATIVE 10/18/2018 1131   PROTEINUR NEGATIVE 10/05/2014 2315   UROBILINOGEN 0.2 10/18/2018 1131   NITRITE NEGATIVE 10/18/2018 1131   LEUKOCYTESUR NEGATIVE 10/18/2018 1131   Sepsis Labs Recent Labs  Lab 01/09/24 0255 01/10/24 0312 01/11/24 0316 01/12/24 0310  WBC 12.7* 10.0 12.3* 9.2   Microbiology Recent Results (from the past 240 hours)  Blood culture (routine x 2)     Status: None   Collection Time: 01/04/24 12:42 PM   Specimen:  BLOOD  Result Value Ref Range Status   Specimen Description BLOOD RIGHT ANTECUBITAL  Final   Special Requests   Final    BOTTLES DRAWN AEROBIC AND ANAEROBIC Blood Culture adequate volume   Culture   Final    NO GROWTH 5 DAYS Performed at [facility], [address] [place], [place] [address]    Report Status 01/09/2024 FINAL  Final  Blood culture (routine x 2)     Status: Abnormal   Collection Time: 01/04/24  1:00 PM   Specimen: BLOOD RIGHT HAND  Result Value Ref Range Status   Specimen Description BLOOD RIGHT HAND  Final   Special Requests   Final    BOTTLES DRAWN AEROBIC AND ANAEROBIC Blood Culture adequate volume   Culture  Setup Time   Final    GRAM NEGATIVE RODS AEROBIC BOTTLE ONLY CRITICAL RESULT CALLED TO, READ BACK BY AND VERIFIED WITH: ED CHARGE RN OZELL CORDOBA [phone] AT 0959, ADC Performed at [facility], [address], [place], [place] [address]    Culture ESCHERICHIA COLI (A)  Final   Report Status 01/07/2024 FINAL  Final   Organism ID, Bacteria ESCHERICHIA COLI  Final   Organism ID, Bacteria ESCHERICHIA COLI  Final      Susceptibility   Escherichia coli - KIRBY BAUER*    CEFAZOLIN  SENSITIVE Sensitive    Escherichia coli - MIC*    AMPICILLIN <=2 SENSITIVE Sensitive     CEFEPIME <=0.12 SENSITIVE Sensitive     CEFTAZIDIME <=1 SENSITIVE Sensitive     CEFTRIAXONE  <=0.25 SENSITIVE Sensitive     CIPROFLOXACIN <=0.25 SENSITIVE Sensitive     GENTAMICIN <=1 SENSITIVE Sensitive     IMIPENEM <=0.25 SENSITIVE Sensitive     TRIMETH /SULFA  <=20 SENSITIVE Sensitive     AMPICILLIN/SULBACTAM <=2 SENSITIVE Sensitive     PIP/TAZO <=4 SENSITIVE Sensitive ug/mL    * ESCHERICHIA COLI    ESCHERICHIA COLI  Blood Culture ID Panel (Reflexed)     Status: Abnormal   Collection Time: 01/04/24  1:00 PM  Result Value Ref Range Status   Enterococcus faecalis NOT DETECTED NOT DETECTED  Final   Enterococcus Faecium NOT DETECTED NOT DETECTED Final   Listeria monocytogenes NOT  DETECTED NOT DETECTED Final   Staphylococcus species NOT DETECTED NOT DETECTED Final   Staphylococcus aureus (BCID) NOT DETECTED NOT DETECTED Final   Staphylococcus epidermidis NOT DETECTED NOT DETECTED Final   Staphylococcus lugdunensis NOT DETECTED NOT DETECTED Final   Streptococcus species NOT DETECTED NOT DETECTED Final   Streptococcus agalactiae NOT DETECTED NOT DETECTED Final   Streptococcus pneumoniae NOT DETECTED NOT DETECTED Final   Streptococcus pyogenes NOT DETECTED NOT DETECTED Final   A.calcoaceticus-baumannii NOT DETECTED NOT DETECTED Final   Bacteroides fragilis NOT DETECTED NOT DETECTED Final   Enterobacterales DETECTED (A) NOT DETECTED Final    Comment: Enterobacterales represent a large order of gram negative bacteria, not a single organism. CRITICAL RESULT CALLED TO, READ BACK BY AND VERIFIED WITH: ED CHARGE RN MICHAEL D. [phone] AT [phone], ADC    Enterobacter cloacae complex NOT DETECTED NOT DETECTED Final   Escherichia coli DETECTED (A) NOT DETECTED Final    Comment: CRITICAL RESULT CALLED TO, READ BACK BY AND VERIFIED WITH: ED CHARGE RN MICHAEL D. [phone] AT 0959, ADC    Klebsiella aerogenes NOT DETECTED NOT DETECTED Final   Klebsiella oxytoca NOT DETECTED NOT DETECTED Final   Klebsiella pneumoniae NOT DETECTED NOT DETECTED Final   Proteus species NOT DETECTED NOT DETECTED Final   Salmonella species NOT DETECTED NOT DETECTED Final   Serratia marcescens NOT DETECTED NOT DETECTED Final   Haemophilus influenzae NOT DETECTED NOT DETECTED Final   Neisseria meningitidis NOT DETECTED NOT DETECTED Final   Pseudomonas aeruginosa NOT DETECTED NOT DETECTED Final   Stenotrophomonas maltophilia NOT DETECTED NOT DETECTED Final   Candida albicans NOT DETECTED NOT DETECTED Final   Candida auris NOT DETECTED NOT DETECTED Final   Candida glabrata NOT DETECTED NOT DETECTED Final   Candida krusei NOT DETECTED NOT DETECTED Final   Candida parapsilosis NOT DETECTED NOT DETECTED Final    Candida tropicalis NOT DETECTED NOT DETECTED Final   Cryptococcus neoformans/gattii NOT DETECTED NOT DETECTED Final   CTX-M ESBL NOT DETECTED NOT DETECTED Final   Carbapenem resistance IMP NOT DETECTED NOT DETECTED Final   Carbapenem resistance KPC NOT DETECTED NOT DETECTED Final   Carbapenem resistance NDM NOT DETECTED NOT DETECTED Final   Carbapenem resist OXA 48 LIKE NOT DETECTED NOT DETECTED Final   Carbapenem resistance VIM NOT DETECTED NOT DETECTED Final    Comment: Performed at [facility], [address], [place], [place] [address]  Blood culture (routine x 2)     Status: None   Collection Time: 01/06/24 12:15 PM   Specimen: BLOOD  Result Value Ref Range Status   Specimen Description BLOOD SITE NOT SPECIFIED  Final   Special Requests   Final    BOTTLES DRAWN AEROBIC AND ANAEROBIC Blood Culture adequate volume   Culture   Final    NO GROWTH 5 DAYS Performed at [facility], [address] [place], [place] [address]    Report Status 01/11/2024 FINAL  Final  Blood culture (routine x 2)     Status: None   Collection Time: 01/06/24 12:20 PM   Specimen: BLOOD LEFT ARM  Result Value Ref Range Status   Specimen Description BLOOD LEFT ARM  Final   Special Requests   Final    BOTTLES DRAWN AEROBIC AND ANAEROBIC Blood Culture adequate volume   Culture   Final    NO GROWTH 5 DAYS Performed at [facility], [address], [place], [place] [address]    Report Status 01/11/2024 FINAL  Final  Culture, Urine (Do not remove urinary catheter, catheter placed by urology or difficult to place)     Status: Abnormal   Collection Time: 01/06/24  3:49 PM   Specimen: Urine, Catheterized  Result Value Ref Range Status   Specimen Description URINE, CATHETERIZED  Final   Special Requests NONE  Final   Culture (A)  Final    <10,000 COLONIES/mL INSIGNIFICANT GROWTH Performed at [facility], [address], [place], [place] [address]    Report Status 01/07/2024 FINAL   Final  Body fluid culture w Gram Stain     Status: None   Collection Time: 01/07/24 12:29 PM   Specimen: Pelvis; Body Fluid  Result Value Ref Range Status   Specimen Description   Final    PELVIS Performed at [facility], [address], [place], [place] [address]    Special Requests   Final    NONE Performed at [facility], [address], [place], [place] [address]    Gram Stain   Final    ABUNDANT WBC PRESENT, PREDOMINANTLY PMN NO ORGANISMS SEEN    Culture   Final    NO GROWTH 3 DAYS Performed at [facility], [address], [place], [place] [address]    Report Status 01/11/2024 FINAL  Final  Aerobic/Anaerobic Culture w Gram Stain (surgical/deep wound)     Status: None (Preliminary result)   Collection Time: 01/07/24  1:40 PM   Specimen: Pelvis; Peritoneal Fluid  Result Value Ref Range Status   Specimen Description   Final    PELVIS Performed at [facility], [address], [place], [place] [address]    Special Requests   Final    NONE Performed at [facility], [address] [place], [place] [address]    Gram Stain   Final    MODERATE WBC PRESENT, PREDOMINANTLY PMN NO ORGANISMS SEEN    Culture   Final    RARE GRAM NEGATIVE RODS IDENTIFICATION AND SUSCEPTIBILITIES TO FOLLOW Performed at [facility], [address], [place], [place] [address]    Report Status PENDING  Incomplete    Procedures/Studies: DG C-Arm 1-60 Min-No Report Result Date: 01/08/2024 Fluoroscopy was utilized by the requesting physician.  No radiographic interpretation.   CT GUIDED PERITONEAL/RETROPERITONEAL FLUID DRAIN BY PERC CATH Result Date: 01/07/2024 INDICATION: 63 year old with history of prostatectomy and pelvic fluid collection. Concern for a urinary leak. EXAM: CT GUIDED DRAIN PLACEMENT IN PELVIC FLUID COLLECTION TECHNIQUE: Multidetector CT imaging of the pelvis was performed following the  standard protocol with and without contrast IV contrast. RADIATION DOSE REDUCTION: This exam was performed according to the departmental dose-optimization program which includes automated exposure control, adjustment of the mA and/or kV according to patient size and/or use of iterative reconstruction technique. CONTRAST:  50 mL Omnipaque  300 MEDICATIONS: Moderate sedation ANESTHESIA/SEDATION: Moderate (conscious) sedation was employed during this procedure. A total of Versed  4 mg and Fentanyl  200 mcg was administered intravenously by the radiology nurse. Total intra-service moderate Sedation Time: 50 minutes. The patient's level of consciousness and vital signs were monitored continuously by radiology nursing throughout the procedure under my direct supervision. COMPLICATIONS: None immediate. PROCEDURE: Informed written consent was obtained from the patient after a thorough discussion of the procedural risks, benefits and alternatives. All questions were addressed. A timeout was performed prior to the  initiation of the procedure. Patient was placed supine on CT scanner. CT images through the pelvis were obtained without contrast. Additional CT images were obtained after the administration intravenous contrast to better characterize the fluid collections. Patient was placed prone and additional CT images were obtained. Urinary leak was identified at this time. Findings discussed with Dr. Selma in urology. We decided to proceed with a transgluteal pelvic drain placement. The left buttock was prepped with chlorhexidine  and sterile field was created. Maximal barrier sterile technique was utilized including caps, mask, sterile gowns, sterile gloves, sterile drape, hand hygiene and skin antiseptic. Skin was anesthetized with 1% lidocaine . Small incision was made. Using CT guidance, an 18 gauge trocar needle was directed into the left posterior fluid collection from a transgluteal approach. Yellow fluid was aspirated.  Superstiff Amplatz wire was placed. The tract was dilated to accommodate a 10 [place] multipurpose drain. Additional yellow fluid was obtained. Fluid was sent for culture and creatinine level. Drain was attached to a suction bulb and sutured to the skin. Dressing was placed. FINDINGS: Initial set of images demonstrated a complex fluid collection in the pelvis surrounding the rectum. In addition, there appeared to be a new fluid collection in the right lower quadrant extending to the pelvis. Subsequent images were obtained with the patient prone and there was contrast extravasating in the pelvis. There appears to be a leak on image 47, sequence number 7. This leak is near the base of the bladder along the left posterior aspect. There is contrast extravasating anterior and posterior to the bladder. Drain placed from a left transgluteal approach. Drain is situated within the left component of the pelvic fluid collection. Yellow fluid was aspirated from the drain. IMPRESSION: 1. Positive for a urinary leak. Leak is located along the left posterior aspect of the urethra near the bladder base. 2. CT-guided placement of a left transgluteal pelvic drain. Electronically Signed   By: Juliene Balder M.D.   On: 01/07/2024 19:53   CT CYSTOGRAM PELVIS Result Date: 01/07/2024 CLINICAL DATA:  History of recent prostatectomy with recurrent pelvic fluid collection which has been drained. Bladder leak was noted during the drainage catheter placement. EXAM: CT CYSTOGRAM (CT ABDOMEN AND PELVIS WITH CONTRAST) TECHNIQUE: Multi-detector CT imaging through the abdomen and pelvis was performed after dilute contrast had been introduced into the bladder for the purposes of performing CT cystography. RADIATION DOSE REDUCTION: This exam was performed according to the departmental dose-optimization program which includes automated exposure control, adjustment of the mA and/or kV according to patient size and/or use of iterative reconstruction  technique. CONTRAST:  OMNIPAQUE  IOHEXOL  300 MG/ML  SOLN COMPARISON:  Drainage procedure from earlier in the same day as well as CT from the previous day. FINDINGS: Lower chest: Mild atelectatic changes are noted in the bases bilaterally. Hepatobiliary: No focal liver abnormality is seen. No gallstones, gallbladder wall thickening, or biliary dilatation. Vicarious excretion of contrast is noted within the gallbladder. Pancreas: Unremarkable. No pancreatic ductal dilatation or surrounding inflammatory changes. Spleen: Normal in size without focal abnormality. Adrenals/Urinary Tract: Adrenal glands are within normal limits. Kidneys demonstrate a normal enhancement pattern bilaterally. Normal excretion is seen. The bladder is distended with contrast enhanced urine related to retrograde placement via the Foley catheter as well as recently injected IV contrast. No obstructive changes are seen. The bladder is well distended and there is a large bladder wall defect noted at the bladder neck inferiorly and posteriorly on the left similar to that seen on the  recent CT intervention. Apparent extension into the proximal urethra is noted. Considerable extravasation of contrast is noted around the base of the bladder extraperitoneally. There does not appear to be any passage of contrast material into the recently drained fluid collection which appears decreased in size when compared with the prior exam. Additionally no significant passage of contrast into the peritoneal space is noted. Stomach/Bowel: No obstructive or inflammatory changes of the colon are seen. Mild small bowel dilatation is noted which appears increased when compare with the prior exam which may represent a degree of postoperative ileus reactive to the free fluid in the pelvis and peritoneal cavity. Stomach is well distended with fluid. The appendix is not well appreciated. Vascular/Lymphatic: Aortic atherosclerosis. No enlarged abdominal or pelvic lymph  nodes. Reproductive: Prostate has been surgically removed. Other: Multiple fluid collections are identified within the abdomen. One lies along the inferior margin of the liver interposed between the liver and the right kidney which measures approximately 4.2 x 2.6 cm best seen on image number 17 of series 7. A second larger collection is noted in the right hemiabdomen which is somewhat lunate in shape measuring approximately 9.0 x 3.0 cm best seen on image number 30 of series 7. Additional right-sided collection is noted inferiorly best seen on image number 45 of series 7 measuring up to 8.3 cm. The known recently drained pelvic collection has decreased significantly in size. Musculoskeletal: No acute bony abnormality is noted. IMPRESSION: Large defect in the bladder neck and proximal urethra posteriorly on the left similar to that seen on the prior interventional CT images. The defect measures approximately 13 mm on the coronal images and up to 2 cm posteriorly on the sagittal delayed images. On the axial images it measures approximately 10 mm in AP dimension. Extraperitoneal extravasation of contrast is noted. No intraperitoneal involvement is seen. Increase in size and number of additional fluid collections within the peritoneum as described. These are new when compared with the exam from the previous day. Mild small bowel dilatation is noted which may be related to a degree of ileus reactive to the fluid changes within the abdomen. Correlation with physical exam is recommended. Interval placement of drainage catheter in the left inferior pelvis with significant decompression of the previously seen fluid collection. Electronically Signed   By: Oneil Devonshire M.D.   On: 01/07/2024 19:23   CT HEMATURIA WORKUP Result Date: 01/07/2024 CLINICAL DATA:  Hematuria. EXAM: CT ABDOMEN AND PELVIS WITHOUT AND WITH CONTRAST TECHNIQUE: Multidetector CT imaging of the abdomen and pelvis was performed following the standard  protocol before and following the bolus administration of intravenous contrast. RADIATION DOSE REDUCTION: This exam was performed according to the departmental dose-optimization program which includes automated exposure control, adjustment of the mA and/or kV according to patient size and/or use of iterative reconstruction technique. CONTRAST:  OMNIPAQUE  IOHEXOL  300 MG/ML  SOLN COMPARISON:  CT abdomen and pelvis 01/06/2024 FINDINGS: Lower chest: There is mild atelectasis in the lung bases. Hepatobiliary: There is some excreted contrast in the gallbladder. Otherwise, liver, gallbladder and bile ducts are within normal limits. Pancreas: Unremarkable. No pancreatic ductal dilatation or surrounding inflammatory changes. Spleen: Normal in size without focal abnormality. Adrenals/Urinary Tract: There is a rounded hypodensity in the inferior pole the right kidney most compatible with cysts measuring 1 cm. No other focal renal lesions are identified. Kidneys are normal in size and contour. There is no perinephric fluid or stranding. There is a small amount of residual contrast seen in  the kidneys on precontrast imaging which can be seen with acute renal function disorder. There is no hydronephrosis. The bilateral ureters are opacified to the level of the pelvic inlet and appear within normal limits. The distal ureters are not well opacified. There is a Foley catheter in the bladder. The bladder is distended with contrast. There is bladder rupture with leakage of contrast from the inferior left bladder wall extending into extraperitoneal spaces inferiorly and anteriorly bilaterally. Stomach/Bowel: There are numerous dilated small bowel loops measuring up to 4.2 cm with air-fluid levels. These have increased in size when compared to prior. No definitive transition point visualized. The colon appears nondilated. There is a moderate air-fluid level in the stomach. Vascular/Lymphatic: The aorta and IVC are normal in size.  There are atherosclerotic calcifications of the aorta. No enlarged lymph nodes are identified. Reproductive: Prostate gland is not well defined. Other: There is a new posterior percutaneous catheter with distal tip in the upper left pelvis. Previously identified enhancing fluid collection in the posterior pelvis has slightly decreased in size but now contains air. This collection measures 6.5 x 4.5 cm (previously 9.5 x 7.3 cm on similar image. There is new enhancing fluid collection in the paracolic gutter abutting multiple small bowel loops. This collection measures approximately 8.0 x 3.7 by 10.0 cm. There is also a new small amount of fluid between the inferior margin of the liver and right kidney without enhancement. This fluid measures 1.7 x 3.6 by 7.5 cm. Musculoskeletal: No fracture is seen. IMPRESSION: 1. Extraperitoneal bladder rupture with leakage of contrast from the inferior left bladder wall extending into extraperitoneal spaces inferiorly and anteriorly bilaterally. 2. New posterior percutaneous catheter with distal tip in the upper left pelvis. 3. Previously identified enhancing fluid collection in the posterior pelvis has slightly decreased in size but now contains air. 4. New enhancing fluid collection in the right paracolic gutter abutting multiple small bowel loops measuring 8.0 x 3.7 x 10.0 cm. 5. New small amount of fluid between the inferior margin of the liver and right kidney without enhancement. 6. New small bilateral pleural effusions. 7. New small amount of residual contrast in the kidneys on precontrast imaging which can be seen with acute renal injury. 8. Increasing size of small bowel loops with air-fluid levels. No definitive transition point visualized. Findings may be related to ileus or partial small bowel obstruction. 9. Aortic atherosclerosis. Aortic Atherosclerosis (ICD10-I70.0). Electronically Signed   By: Greig Pique M.D.   On: 01/07/2024 19:15   CT ABDOMEN PELVIS W  CONTRAST Result Date: 01/06/2024 CLINICAL DATA:  Abdominal pain, post-op. * Tracking Code: BO * EXAM: CT ABDOMEN AND PELVIS WITH CONTRAST TECHNIQUE: Multidetector CT imaging of the abdomen and pelvis was performed using the standard protocol following bolus administration of intravenous contrast. RADIATION DOSE REDUCTION: This exam was performed according to the departmental dose-optimization program which includes automated exposure control, adjustment of the mA and/or kV according to patient size and/or use of iterative reconstruction technique. CONTRAST:  75mL OMNIPAQUE  IOHEXOL  350 MG/ML SOLN COMPARISON:  CT scan abdomen and pelvis from 01/04/2024. FINDINGS: Lower chest: There are subpleural atelectatic changes in the visualized lung bases. No overt consolidation. No pleural effusion. The heart is normal in size. No pericardial effusion. Hepatobiliary: The liver is normal in size. Non-cirrhotic configuration. No suspicious mass. No intrahepatic or extrahepatic bile duct dilation. There are new hyperattenuating areas in the dependent portion of the gallbladder, which may represent vicarious excretion of previously administered intravenous contrast. Normal gallbladder  wall thickness. No pericholecystic inflammatory changes. Pancreas: Unremarkable. No pancreatic ductal dilatation or surrounding inflammatory changes. Spleen: Within normal limits. No focal lesion. Adrenals/Urinary Tract: Adrenal glands are unremarkable. No suspicious renal mass. No hydronephrosis. No renal or ureteric calculi. Urinary bladder is decompressed secondary to Foley catheter, which appears in satisfactory position Stomach/Bowel: No disproportionate dilation of the small or large bowel loops. Appendix is not distinctly visualized. Vascular/Lymphatic: Redemonstration of fluid collection in the dependent pelvis currently measuring up to 7.3 x 9.6 cm, increased since the prior study. The collection exhibits thin hyperattenuating incomplete  walls. Findings again may represent postoperative seroma versus developing abscess. There is also redemonstration of fluid and small amount of nondependent air in the space of Retzius, surrounding the urinary bladder as well as along the bilateral pelvic sidewalls, which is likely secondary to false passage/malposition of the Foley bulb seen on the prior exam. No abdominal or pelvic lymphadenopathy, by size criteria. No aneurysmal dilation of the major abdominal arteries. There are moderate peripheral atherosclerotic vascular calcifications of the aorta and its major branches. Reproductive: There is history of prostate resection. Other: Supraumbilical midline surgical scar noted. Musculoskeletal: No suspicious osseous lesions. There are mild multilevel degenerative changes in the visualized spine. IMPRESSION: 1. Redemonstration of fluid collection in the dependent pelvis, which has increased since the prior study. The collection exhibits thin hyperattenuating incomplete walls. Findings again may represent postoperative seroma versus developing abscess. 2. There is also redemonstration of fluid and small amount of nondependent air in the Space of Retzius, surrounding the urinary bladder as well as along the bilateral pelvic sidewalls, which is likely secondary to false passage/malposition of the Foley bulb seen on the prior exam. 3. Multiple other nonacute observations, as described above. Aortic Atherosclerosis (ICD10-I70.0). Electronically Signed   By: Ree Molt M.D.   On: 01/06/2024 15:03   MR Cervical Spine Wo Contrast Result Date: 01/04/2024 EXAM: MRI CERVICAL SPINE WITHOUT CONTRAST 01/04/2024 03:46:13 PM TECHNIQUE: Multiplanar multisequence MRI of the cervical spine was performed without the administration of intravenous contrast. COMPARISON: CT cervical spine dated 07/2021/24. CLINICAL HISTORY: Ataxia, nontraumatic, cervical pathology suspected. FINDINGS: BONES AND ALIGNMENT: Straightening of the normal  cervical lordosis. Similar trace anterolisthesis of C7 on T1, likely related to facet arthrosis. No bone marrow edema or evidence of fracture. Vertebral body heights are maintained. SPINAL CORD: No cervical spinal cord signal abnormality. SOFT TISSUES: Paraspinal soft tissues are unremarkable. C2-C3: There is a disc osteophyte complex slightly eccentric to the left which indents the ventral thecal sac with mild flattening of the ventral cervical cord. Bilateral facet arthrosis, more pronounced on the left. There is severe foraminal stenosis on the left. C3-C4: There is a disc osteophyte complex and left paracentral disc protrusion which contacts and flattens the ventral cervical cord. Mild thickening of the ligamentum flavum. Mild-to-moderate spinal canal stenosis. Bilateral facet arthrosis resulting in mild bilateral foraminal stenosis. C4-C5: There is moderate disc desiccation and disc osteophyte complex at C4-C5, eccentric to the left which indents the ventral thecal sac with mild flattening of the ventral cervical cord. Mild-to-moderate spinal canal stenosis. Bilateral facet arthrosis. There is moderate-to-severe right and mild left foraminal stenosis. C5-C6: There is a disc osteophyte complex and right central disc protrusion which indents the ventral thecal sac which also contacts the ventral cervical cord. Thickening of the ligaments. Moderate spinal canal stenosis. Facet arthrosis and uncovertebral hypertrophy with moderate right and mild left foraminal stenosis. C6-C7: There is a disc osteophyte complex and right paracentral disc protrusion which  indents the thecal sac without contacting the spinal cord. Bilateral facet arthrosis and a preligamentous osteophyte complex, more pronounced on the right. There is moderate right and mild left foraminal stenosis. C7-T1: There is a diffuse disc bulge which indents the thecal sac without contact with the spinal cord. Bilateral severe facet arthrosis. There is  moderate bilateral foraminal stenosis. IMPRESSION: 1. Degenerative changes in the cervical spine as detailed above. 2. Multilevel foraminal stenosis, greatest and severe on the left at C2-3, similar to prior. 3. Additional moderate-to-severe foraminal stenosis on the right at C4-5 and moderate foraminal stenosis on the right at C5-6 and C6-7 and bilaterally at C7-T1. 4. Similar moderate spinal canal stenosis at C5-6 and C7-T1. Electronically signed by: Donnice Mania MD 01/04/2024 04:54 PM EDT RP Workstation: [id number]   MR Brain W and Wo Contrast Result Date: 01/04/2024 EXAM: MRI BRAIN WITH AND WITHOUT CONTRAST 01/04/2024 03:45:23 PM TECHNIQUE: Multiplanar multisequence MRI of the head/brain was performed with and without the administration of intravenous contrast. COMPARISON: CT head and CTA head and neck earlier the same day. MRI head dated 11/26/2015. CLINICAL HISTORY: Metastatic disease evaluation. FINDINGS: BRAIN AND VENTRICLES: Scattered areas of T2/FLAIR hyperintensity in the supratentorial white matter, predominantly within the subcortical white matter, which is mildly increased compared to the MRI from 2017. No abnormal enhancement of the brain parenchyma. No acute infarct. No acute intracranial hemorrhage. No mass effect or midline shift. No hydrocephalus. The sella is unremarkable. Normal flow voids. No mass. ORBITS: No acute abnormality. SINUSES: No acute abnormality. BONES AND SOFT TISSUES: Normal bone marrow signal and enhancement. No acute soft tissue abnormality. IMPRESSION: 1. No acute intracranial abnormality. 2. Scattered areas of signal abnormality primarily in the subcortical white matter, mildly increased compared to the MRI from 2017. Findings likely reflect chronic microvascular ischemic changes versus other vasculopathies. 3. No abnormal enhancement. Electronically signed by: Donnice Mania MD 01/04/2024 04:04 PM EDT RP Workstation: [id number]   CT C-SPINE NO CHARGE Result Date:  01/04/2024 CLINICAL DATA:  Aphasia EXAM: CT CERVICAL SPINE WITHOUT CONTRAST TECHNIQUE: Multidetector CT imaging of the cervical spine was performed without intravenous contrast. Multiplanar CT image reconstructions were also generated. RADIATION DOSE REDUCTION: This exam was performed according to the departmental dose-optimization program which includes automated exposure control, adjustment of the mA and/or kV according to patient size and/or use of iterative reconstruction technique. COMPARISON:  None Available. FINDINGS: Alignment: Reversal of the normal cervical lordosis, likely degenerative in nature. 2 mm anterolisthesis C7-T1, degenerative in nature. Skull base and vertebrae: Radius cervical alignment is normal. The atlantodental interval is not widened. No acute fracture of the cervical spine. Vertebral body height is preserved. Soft tissues and spinal canal: No prevertebral fluid or swelling. No visible canal hematoma. Findings are correlated concurrently performed CT arteriogram the neck. 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 Disc levels: There is diffuse intervertebral disc space narrowing and endplate remodeling throughout the cervical spine in keeping with changes of diffuse severe degenerative disc disease. Prevertebral soft tissues are not thickened on sagittal reformats. Multilevel uncovertebral and facet arthrosis results in multilevel moderate to severe neuroforaminal narrowing, most severe on the right at C4-5, C5-6, and C6-7 on the left at C2-3 and bilaterally C7-T1. Upper chest:  Negative. Other: None IMPRESSION: 1. No acute fracture or listhesis of the cervical spine. 2. 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 This would be better assessed with MRI examination. 3. Multilevel uncovertebral and facet arthrosis results in multilevel moderate to severe neuroforaminal narrowing, most severe on the right at C4-5, C5-6, and C6-7 on the left at C2-3 and bilaterally C7-T1. Electronically Signed   By: Dorethia Molt M.D.   On: 01/04/2024 12:10   CT ABDOMEN PELVIS W CONTRAST Result Date: 01/04/2024 CLINICAL DATA:  Abdominal pain, acute, nonlocalized h/o prostate ca resection last week, abd pain. * Tracking Code: BO * EXAM: CT ABDOMEN AND PELVIS WITH CONTRAST TECHNIQUE: Multidetector CT imaging of the abdomen and pelvis was performed using the standard protocol following bolus administration of intravenous contrast. RADIATION DOSE REDUCTION: This exam was performed according to the departmental dose-optimization program which includes automated exposure control, adjustment of the mA and/or kV according to patient size and/or use of iterative reconstruction technique. CONTRAST:  75mL OMNIPAQUE  IOHEXOL  350 MG/ML SOLN COMPARISON:  CT scan abdomen and pelvis from 12/29/2023. FINDINGS: Lower chest: The lung bases are clear. No pleural effusion. The heart is normal in size. No pericardial effusion. Hepatobiliary: The liver is normal in size. Non-cirrhotic configuration. No suspicious mass. No intrahepatic or extrahepatic bile duct dilation. No calcified gallstones. Normal gallbladder wall thickness. No pericholecystic inflammatory changes. Pancreas: Unremarkable. No pancreatic ductal dilatation or surrounding inflammatory changes. Spleen: Within normal limits. No focal lesion. Adrenals/Urinary Tract:  Adrenal glands are unremarkable. No suspicious renal mass. There is a 6 x 12 mm cortical cyst in the right kidney lower pole. No nephroureterolithiasis or obstructive uropathy. Urinary bladder is partially distended despite a Foley catheter. The balloon of the Foley catheter appears inferior to the urinary bladder and is on the left paramedian side. Correlate clinically and urine out for optimal positioning. Stomach/Bowel: No disproportionate dilation of the small or large bowel loops. There is mild circumferential thickening of several distal small bowel loops in the right mid/lower abdomen, which are nonspecific but can be seen with focal enteritis. Vascular/Lymphatic: There is an approximately 4.2 x 7.0 cm sized fluid in the dependent pelvis, with thin hyperattenuating walls. Findings may represent postoperative seroma versus abscess. Correlate clinically. Small amount of free intraperitoneal air noted along the lower midline anterior abdominal wall and along the left inguinal canal, likely from recent surgery. There is additional trace ascites mainly in the perisplenic region, significantly decreased since the prior study. No abdominal or pelvic lymphadenopathy, by size criteria. No aneurysmal dilation of the major abdominal arteries. There are moderate peripheral atherosclerotic vascular calcifications of the aorta and its major branches. Reproductive: There is provided history of recent prostate resection. Other: Supraumbilical midline surgical scar noted. The soft tissues and abdominal wall are otherwise unremarkable. Musculoskeletal: No suspicious osseous lesions. There are mild multilevel degenerative changes in the visualized spine. IMPRESSION: 1. Recent prostatectomy. There is an approximately 4.2 x 7.0 cm sized fluid collection in the dependent pelvis with thin hyperattenuating walls. Findings may represent postoperative seroma versus abscess. Correlate clinically. 2. There is mild circumferential  thickening of several distal small bowel loops in the right mid/lower abdomen, which are nonspecific but can be seen with focal enteritis.  3. There is a Foley catheter in-situ however, the balloon appears inferior to the urinary bladder and on the left paramedian side. Correlate clinically with urine out to determine positioning. 4. Multiple other nonacute observations, as described above. Aortic Atherosclerosis (ICD10-I70.0). Electronically Signed   By: Ree Molt M.D.   On: 01/04/2024 10:59   CT ANGIO HEAD NECK W WO CM (CODE STROKE) Result Date: 01/04/2024 EXAM: CTA Head and Neck with Intravenous Contrast. CT Head without Contrast. CLINICAL HISTORY: Stroke, follow up. Aphasia. Code stroke. Dr. Arora. TECHNIQUE: Axial CTA images of the head and neck performed with intravenous contrast. MIP reconstructed images were created and reviewed. Axial computed tomography images of the head/brain performed without intravenous contrast. Note: Per PQRS, the description of internal carotid artery percent stenosis, including 0 percent or normal exam, is based on [place] [employer] Symptomatic Carotid Endarterectomy Trial (NASCET) criteria. Dose reduction technique was used including one or more of the following: automated exposure control, adjustment of mA and kV according to patient size, and/or iterative reconstruction. CONTRAST: 75 mL of iohexol  (OMNIPAQUE ) 350 MG/ML injection. COMPARISON: Head CT 01/04/2024 and brain MRI 11/26/2015. FINDINGS: CT HEAD: BRAIN: No acute intraparenchymal hemorrhage. No mass lesion. No CT evidence for acute territorial infarct. No midline shift or extra-axial collection. Patchy supratentorial white matter hypoattenuation is unchanged compared to the earlier head CT and compatible with the findings of the MRI from 11/26/2015. VENTRICLES: No hydrocephalus. ORBITS: The orbits are unremarkable. SINUSES AND MASTOIDS: The paranasal sinuses and mastoid air cells are clear. CTA NECK: COMMON CAROTID  ARTERIES: No significant stenosis. No dissection or occlusion. INTERNAL CAROTID ARTERIES: No stenosis by NASCET criteria. No dissection or occlusion. VERTEBRAL ARTERIES: Left vertebral artery arises independently from the aortic arch, a normal variant. No significant stenosis. No dissection or occlusion. CTA HEAD: ANTERIOR CEREBRAL ARTERIES: No significant stenosis. No occlusion. No aneurysm. MIDDLE CEREBRAL ARTERIES: No significant stenosis. No occlusion. No aneurysm. POSTERIOR CEREBRAL ARTERIES: No significant stenosis. No occlusion. No aneurysm. BASILAR ARTERY: No significant stenosis. No occlusion. No aneurysm. OTHER: Calcific aortic atherosclerosis. SOFT TISSUES: No acute finding. No masses or lymphadenopathy. BONES: No acute osseous abnormality. IMPRESSION: 1. No acute intracranial hemorrhage 2. No evidence of significant stenosis, aneurysmal dilatation, or dissection involving the arteries of the head and neck. 3. Unchanged patchy supratentorial white matter hypoattenuation, compatible with prior MRI findings from 11/26/15. Electronically signed by: Franky Stanford MD 01/04/2024 10:25 AM EDT RP Workstation: [id number]   CT HEAD CODE STROKE WO CONTRAST Result Date: 01/04/2024 EXAM: CT HEAD WITHOUT 01/04/2024 09:55:51 AM TECHNIQUE: CT of the head was performed without the administration of intravenous contrast. Automated exposure control, iterative reconstruction, and/or weight based adjustment of the mA/kV was utilized to reduce the radiation dose to as low as reasonably achievable. COMPARISON: MRI head dated 02/03/2026. CLINICAL HISTORY: Neuro deficit, acute, stroke suspected. Aphasia, code stroke, dr deedra. FINDINGS: BRAIN AND VENTRICLES: No acute intracranial hemorrhage. No mass effect or midline shift. No extra-axial fluid collection. Gray-white differentiation is maintained. No hydrocephalus. Multiple scattered areas of hypoattenuation of the subcortical white matter corresponding to findings on prior MRI.  ORBITS: No acute abnormality. SINUSES AND MASTOIDS: No acute abnormality. SOFT TISSUES AND SKULL: No acute skull fracture. No acute soft tissue abnormality. IMPRESSION: 1. No acute intracranial abnormality. ASPECTS is 10. 2. Multiple scattered areas of hypoattenuation in the subcortical white matter, corresponding to findings on prior MRI. 3. Findings messaged to Dr. Arora via the [facility] messaging system at 10:03 AM on 01/04/24. Electronically signed by: Donnice  Hunt MD 01/04/2024 10:05 AM EDT RP Workstation: [id number]   CT ABDOMEN PELVIS WO CONTRAST Result Date: 12/29/2023 CLINICAL DATA:  Abdominal pain and history of prostatectomy 3 days ago, initial encounter EXAM: CT ABDOMEN AND PELVIS WITHOUT CONTRAST TECHNIQUE: Multidetector CT imaging of the abdomen and pelvis was performed following the standard protocol without IV contrast. RADIATION DOSE REDUCTION: This exam was performed according to the departmental dose-optimization program which includes automated exposure control, adjustment of the mA and/or kV according to patient size and/or use of iterative reconstruction technique. COMPARISON:  03/21/2023 FINDINGS: Lower chest: Lung bases are free of acute infiltrate or sizable effusion. Hepatobiliary: Liver and gallbladder appear within normal limits. Considerable perihepatic fluid is identified. Pancreas: Unremarkable. No pancreatic ductal dilatation or surrounding inflammatory changes. Spleen: Normal in size without focal abnormality. Adrenals/Urinary Tract: Adrenal glands are within normal limits. Kidneys demonstrate no renal calculi or obstructive changes. The bladder is decompressed by Foley catheter. Stomach/Bowel: No obstructive or inflammatory changes of the colon are seen. Scattered air is noted throughout the colon and small bowel likely related to a mild postoperative ileus. No obstructive changes are seen. The appendix is not well visualized although no inflammatory changes to suggest appendicitis  are seen. Stomach is distended with fluid. Again some gaseous distension of the small bowel is noted likely related to small bowel ileus. Vascular/Lymphatic: Aortic atherosclerosis. No enlarged abdominal or pelvic lymph nodes. Reproductive: Prostate has been surgically removed. Other: Postsurgical changes are noted in the operative bed. Free fluid is noted within the pelvis and extending into the abdomen. This may simply be related to the recent surgery. Some free air is noted consistent with the laparoscopic technique. Air is also noted within the abdominal wall and scrotum again consistent with the recent surgery. Musculoskeletal: No acute or significant osseous findings. IMPRESSION: Free fluid and free air is noted likely related to the recent surgery. Expected postsurgical changes in the operative bed. No sizable hematoma is noted. Some gaseous distension of the colon and small bowel is seen likely representing a postoperative ileus. No true obstructive changes are seen. Electronically Signed   By: Oneil Devonshire M.D.   On: 12/29/2023 02:05     Time coordinating discharge: Over 30 minutes    Alm Apo, MD  [facility] 01/12/2024, 1:42 PM

## 2024-01-13 ENCOUNTER — Telehealth: Payer: Self-pay | Admitting: *Deleted

## 2024-01-13 LAB — AEROBIC/ANAEROBIC CULTURE W GRAM STAIN (SURGICAL/DEEP WOUND)

## 2024-01-13 NOTE — Transitions of Care (Post Inpatient/ED Visit) (Signed)
 01/13/2024  Name: [name] MRN: [id number] DOB: [birth year]  Today's TOC FU Call Status: Today's TOC FU Call Status:: Successful TOC FU Call Completed TOC FU Call Complete Date: 01/13/24 Patient's Name and Date of Birth confirmed.  Transition Care Management Follow-up Telephone Call Date of Discharge: 01/12/24 Discharge Facility: [name]) Type of Discharge: Inpatient Admission Primary Inpatient Discharge Diagnosis:: UTI sepsis with E Coli bacteremia How have you been since you were released from the hospital?: Better (Per spouse: He is much better this time; he just had so many problems after the initial surgery-- but now, things seem to be under better control.  He has been through a lot, but I think this time, things will turn around for him) Any questions or concerns?: No  Items Reviewed: Did you receive and understand the discharge instructions provided?: Yes (thoroughly reviewed with patient who verbalizes good understanding of same) Medications obtained,verified, and reconciled?: Yes (Medications Reviewed) (Full medication reconciliation/ review completed; no concerns or discrepancies identified; confirmed patient obtained/ is taking all newly Rx'd medications as instructed; self-manages medications and denies questions/ concerns around medications today) Any new allergies since your discharge?: No Dietary orders reviewed?: Yes Type of Diet Ordered:: As healthy as possible, low salt Do you have support at home?: Yes People in Home [RPT]: spouse Name of Support/Comfort Primary Source: Reports independent in self-care activities; resides with supportive spouse- assists as/ if needed/ indicated  Medications Reviewed Today: Medications Reviewed Today     Reviewed by [name] (Registered Nurse) on 01/13/24 at 1456  Med List Status: <None>   Medication Order Taking? Sig Documenting Provider Last Dose Status Informant  amLODipine  (NORVASC ) 10 MG tablet  [id number] Yes Take 1 tablet (10 mg total) by mouth daily. English, Enobong, MD  Active Spouse/Significant Other  amoxicillin -clavulanate (AUGMENTIN ) 875-125 MG tablet [id number] Yes Take 1 tablet by mouth every 12 (twelve) hours for 3 days. [name] Lenis, MD  Active   diphenhydramine -acetaminophen  (TYLENOL  PM) 25-500 MG TABS tablet [id number] Yes Take 1 tablet by mouth at bedtime as needed (for pain,sleep). [provider]  Active Spouse/Significant Other  docusate sodium  (COLACE) 100 MG capsule [id number]  Take 1 capsule (100 mg total) by mouth 2 (two) times daily.  Patient not taking: Reported on 01/13/2024   [name] Palma, PA-C  Active Spouse/Significant Other  DULoxetine  (CYMBALTA ) 60 MG capsule [id number] Yes Take 1 capsule (60 mg total) by mouth daily. For chronic pain  Patient taking differently: Take 60 mg by mouth daily. For chronic pain   [name] Clam, MD  Active Spouse/Significant Other  Evolocumab  (REPATHA  SURECLICK) 140 MG/ML SOAJ [id number] Yes INJECT 140 MG INTO THE SKIN EVERY 14 (FOURTEEN) DAYS. [name] LABOR, MD  Active Spouse/Significant Other  HYDROcodone -acetaminophen  (NORCO/VICODIN) 5-325 MG tablet [id number]  Take 1-2 tablets by mouth every 6 (six) hours as needed for moderate pain (pain score 4-6) or severe pain (pain score 7-10).  Patient not taking: Reported on 01/13/2024   [name] Palma, PA-C  Active Spouse/Significant Other  lactulose  (CHRONULAC ) 10 GM/15ML solution [id number] Yes Take 15 mLs (10 g total) by mouth 3 (three) times daily. English, Enobong, MD  Active Spouse/Significant Other  losartan -hydrochlorothiazide  (HYZAAR ) 100-25 MG tablet [id number] Yes Take 1 tablet by mouth daily. English, Enobong, MD  Active Spouse/Significant Other  nitroGLYCERIN  (NITROSTAT ) 0.4 MG SL tablet [id number] Yes Place 1 tablet (0.4 mg total) under the tongue every 5 (five) minutes as needed for chest pain. English, Enobong, MD  Active Spouse/Significant Other  ranolazine   (  RANEXA ) 500 MG 12 hr tablet [id number] Yes Take 1 tablet (500 mg total) by mouth 2 (two) times daily. English, Enobong, MD  Active Spouse/Significant Other           Med Note (English, [name] E   Fri Jan 06, 2024  2:49 PM)    rosuvastatin  (CRESTOR ) 40 MG tablet [id number] Yes Take 1 tablet (40 mg total) by mouth daily. English, Enobong, MD  Active Spouse/Significant Other           Home Care and Equipment/Supplies: Were Home Health Services Ordered?: No Any new equipment or medical supplies ordered?: No  Functional Questionnaire: Do you need assistance with bathing/showering or dressing?: No (reports spouse will provide supervision as indicated) Do you need assistance with meal preparation?: Yes (spouse prepares all medications) Do you need assistance with eating?: No Do you have difficulty maintaining continence: No (confirms has foley catheter post-recent prostatectomy) Do you need assistance with getting out of bed/getting out of a chair/moving?: No Do you have difficulty managing or taking your medications?: No (reports self- manages but post- recent hospital visits: wife assisting and supervising as needed)  Follow up appointments reviewed: PCP Follow-up appointment confirmed?: No (care coordination outreach in real-time with scheduling care guide to facilitate scheduling hospital follow up PCP appointment: care guide reports no provider appointments available until September at PCP practice, with any providers)  care guide offered for patient to go to another practice- provider: patient declined MD Provider Line Number:[phone] Given: No (verified well-established with current PCP) Specialist Hospital Follow-up appointment confirmed?: No Follow-Up Specialty Provider:: Urology Provider: [facility]: confirmed spouse has called provider this morning to schedule hospital follow up office visit: waiting for call-back Reason Specialist Follow-Up Not Confirmed: Patient has  Specialist Provider Number and will Call for Appointment Do you need transportation to your follow-up appointment?: No Do you understand care options if your condition(s) worsen?: Yes-patient verbalized understanding  SDOH Interventions Today    Flowsheet Row Most Recent Value  SDOH Interventions   Food Insecurity Interventions Intervention Not Indicated  Housing Interventions Intervention Not Indicated  Transportation Interventions Intervention Not Indicated  [drives self at baseline,  spouse assisting post- recent surgery/ hospitalizations]  Utilities Interventions Intervention Not Indicated   See TOC assessment tabs for additional assessment/ TOC intervention information  Successfully enrolled into 30-day TOC program- provided my direct contact information should questions/ concerns/ needs arise post-TOC initial call, prior to next  [facility] 30-day program English CM telephone visit    Pls call/ message for questions,  Beatris Blinda Lawrence, English, BSN, Media planner  Transitions of Care  [facility] - [facility] [phone]: direct office

## 2024-01-13 NOTE — Patient Instructions (Signed)
 Visit Information  Thank you for taking time to visit with me today. Please don't hesitate to contact me if I can be of assistance to you before our next scheduled telephone appointment.  Our next appointment is by telephone on Friday, January 20, 2024 at 11:00 am  Please call the care guide team at [phone] if you need to cancel or reschedule your appointment.   Patient Self Care Activities:  Attend all scheduled provider appointments Call provider office for new concerns or questions  Participate in Transition of Care Program/Attend TOC scheduled calls Take medications as prescribed   Continue pacing activity as your recuperation from recent surgery continues Continue cleaning and caring for your urinary catheter every day as you have been doing If you have any questions or concerns about your new drain: please call your surgeon right away If you believe your condition is getting worse- or you have any concerns: contact your care providers (doctors) promptly- reaching out to your doctor early when you have concerns can prevent you from having to go to the hospital  Following is a copy of your care plan:   Goals Addressed             This Visit's Progress    [facility] Transitions of Care (TOC) Care Plan   On track    Problems:  Recent Hospitalization for treatment of surgery with complications- UTI sepsis No Hospital Follow Up Provider appointment:   01/13/24:  care coordination outreach in real-time with scheduling care guide to facilitate scheduling hospital follow up PCP appointment: care guide reports no provider appointments available until September at PCP practice, with any providers - care guide offered for patient to go to another practice- provider: patient declined No Specialist appointment: Urology Provider: [facility]: confirmed spouse has called provider this morning 01/13/24 to schedule hospital follow up office visit: waiting for call-back  Recent hospitalization  August 8-14, 2025 for UTI sepsis following recent planned surgical radical prostatectomy 12/27/23 with post-op complications  (1) unplanned hospital admission and (1) observation hospital admission x last (12) and (6) months Independent at baseline: no assistive devices, drives self / works full time prior to recent surgery; resides with supportive spouse who assists as indicated: provides care for minor child (89 year old nephew)  Goal:  Over the next 30 days, the patient will not experience hospital readmission  Interventions:  Transitions of Care: week # 1/ day # 1 Durable Medical Equipment (DME) needs assessed with patient/caregiver Doctor Visits  - discussed the importance of doctor visits Communication with PCP re: enrollment into [facility] 30-day program Post discharge activity limitations prescribed by provider reviewed Post-op wound/incision care reviewed with patient/caregiver Reviewed Signs and symptoms of infection confirmed not currently requiring/ using assistive devices for ambulation; provided education/ reinforcement around fall prevention  Reinforced/ provided education around care at home of indwelling foley catheter/ JP drain- as per TOC assessment Provided education around expected/ usual side effects of antibiotics  provided my direct contact information should questions/ concerns/ needs arise post-TOC call, prior to next RN CM telephone visit    Surgery (recent surgical radical prostatectomy 12/27/23 with post-op complications): Evaluation of current treatment plan related to prostatectomy surgery assessed patient/caregiver understanding of surgical procedure   reviewed post-operative instructions with patient/caregiver addressed questions about post - surgical incision care  reviewed medications with patient and addressed questions confirmed availability of transportation to all appointments wife providing transportation after recent surgery: patient on current driving  restrictions- drives self at baseline performed  [id number] assessment     Patient Self Care Activities:  Attend all scheduled provider appointments Call provider office for new concerns or questions  Participate in Transition of Care Program/Attend TOC scheduled calls Take medications as prescribed   Continue pacing activity as your recuperation from recent surgery continues Continue cleaning and caring for your urinary catheter every day as you have been doing If you have any questions or concerns about your new drain: please call your surgeon right away If you believe your condition is getting worse- or you have any concerns: contact your care providers (doctors) promptly- reaching out to your doctor early when you have concerns can prevent you from having to go to the hospital  Plan:  Telephone follow up appointment with care management team member scheduled for:  Friday 01/20/24 at 11:00 am        Patient verbalizes understanding of instructions and care plan provided today and agrees to view in MyChart. Active MyChart status and patient understanding of how to access instructions and care plan via MyChart confirmed with patient.     Please call the care guide team at [phone] if you need to cancel or reschedule your appointment.   If you are experiencing a Mental Health or Behavioral Health Crisis or need someone to talk to, please  call the Suicide and Crisis Lifeline: 988 call the [place]  National Suicide Prevention Lifeline: 702-847-0345 or TTY: (418) 813-2444 TTY 780-176-7717) to talk to a trained counselor call 1-800-273-TALK (toll free, 24 hour hotline) go to [facility] [address] [place] [phone]) call the [facility] Crisis Line: (818) 768-2462 call 911   Beatris Blinda Lawrence, RN, BSN, CCRN Alumnus RN Care Manager  Transitions of Care  [facility] - Population Health  [place] [phone]: direct office

## 2024-01-20 ENCOUNTER — Other Ambulatory Visit: Payer: Self-pay | Admitting: *Deleted

## 2024-01-20 NOTE — Transitions of Care (Post Inpatient/ED Visit) (Signed)
 Transition of Care week 2/ day # 7  Visit Note  01/20/2024  Name: [name] MRN: [id number]          DOB: 19-Mar-1961  Situation: Patient enrolled in [facility] 30-day program. Visit completed with patient's spouse [name] by telephone.   HIPAA identifiers x 2 verified  Background:  Recent hospitalization August 8-14, 2025 for UTI sepsis following recent planned surgical radical prostatectomy 12/27/23 with post-op complications  (1) unplanned hospital admission and (1) observation hospital admission x last (12) and (6) months Independent at baseline: no assistive devices, drives self / works full time prior to recent surgery; resides with supportive spouse who assists as indicated: provides care for minor child (93 year old nephew)  Initial Transition Care Management Follow-up Telephone Call    Past Medical History:  Diagnosis Date   Apical variant hypertrophic cardiomyopathy (HCC)    CAD (coronary artery disease)    a. LHC in 2011 with severe dz in D1/D2 and very distal LAD--> too small for PCI and managed medically.  b.  LHC in 10/2013 after an abnormal nuclear study w/ non-obst dz/c. 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   HOH (hard of hearing)    rgiht ear   Hyperlipidemia    Hypertension    Myocardial infarction [facility])    Peripheral vascular disease (HCC)    Stroke (HCC)    Assessment:  per spouse:  He is doing better, getting stronger and seems more like himself; we are managing the catheter without any problems; the drain in his side is not draining anything any more.  He is not in pain- just a little uncomfortable  every now and then.  We do have appointments scheduled with Dr. Newlin next week and with the urology doctor in 2 weeks, after he has a CT scan.  We don't have any concerns today    Spouse denies clinical concerns reports patient is in no distress throughout [facility] 30-day program outreach call today  Patient Reported Symptoms: Cognitive Cognitive Status: Normal speech and language skills, Alert and oriented to person, place, and time, Insightful and able to interpret abstract concepts (per patient's spouse) Cognitive/Intellectual Conditions Management [RPT]: None reported or documented in medical history or problem list      Neurological Neurological Review of Symptoms: Numbness, Other: (per patient's spouse) Oher Neurological Symptoms/Conditions [RPT]: Spouse reports patient is having intermittent tingling and numbeness that doesn't last very long-- in his hands and feet; we don't know why this is happening- so we are going to discuss with Dr. Newlin when we see her on 01/26/24:-- this was encouraged    HEENT HEENT Symptoms Reported: No symptoms reported (per patient's spouse)      Cardiovascular Cardiovascular Symptoms Reported: No symptoms reported (per spouse report) Does patient have uncontrolled Hypertension?: No Cardiovascular Management Strategies: Medication therapy, Routine screening, Diet modification, Coping strategies, Adequate rest  Respiratory Respiratory Symptoms Reported: No symptoms reported (per spouse report) Other Respiratory Symptoms: Denies shortness of breath/ cough: he is breathing just fine, normally Respiratory Management Strategies: Adequate rest  Endocrine Endocrine  Symptoms Reported: No symptoms reported (per spouse report) Is patient diabetic?: No    Gastrointestinal Gastrointestinal Symptoms Reported: No symptoms reported (per spouse report) Other Gastrointestinal Symptoms: His pain is not bad; it is getting better every day; just a little soreness every now  and again; spouse denies that patient is currently having abdominal- flank/ incisional pain- reports appetite is much better; reports having daily BM's now that he is on the lactulose - several times a day Additional Gastrointestinal Details: Confirmed JP drain remains intact: spouse reports no longer draining any liquid at all; reports clean dry dressing covers drain site: no redness, swelling, or drainage around JP drain: reinforced need for ongoing JP site monitoring at home Gastrointestinal Management Strategies: Coping strategies    Genitourinary Genitourinary Symptoms Reported: Other (per spouse report) Other Genitourinary Symptoms: Confirmed foley catheter remains intact draining clear-ish dark yellow urine in good amount; spouse continues able to verbalize excellent understanding of basic care of foley catheter at home without prompting; reinforced signs/ symptoms UTI along with corresponding action plan; confirmed patient now has scheduled urology provider office visit scheduled 02/02/24 Genitourinary Management Strategies: Catheter, indwelling, Medication therapy, Medical device, Adequate rest, Coping strategies  Integumentary Integumentary Symptoms Reported: Incision (per spouse report) Other Integumentary Symptoms: Reports incisional puncture sites around (R) flank and navel are pretty much completely healed up; glue remains on them, there is no swelling, drainage or odor around the puncture sites; Reinforced signs/ symptoms wound infection along with corresponding action plan; ongoing need for basic daily monitoring of sites at home Skin Management Strategies: Routine screening, Dressing changes, Coping strategies  Musculoskeletal Musculoskelatal Symptoms Reviewed: No symptoms reported (per spouse report) Additional Musculoskeletal Details: Reports gradually getting stronger; confirmed not currently requiring/ using assistive devices for ambulation        Psychosocial Psychosocial  Symptoms Reported: No symptoms reported (per spouse report)         There were no vitals filed for this visit.  Medications Reviewed Today     Reviewed by Lovelyn Sheeran M, RN (Registered Nurse) on 01/20/24 at 1101  Med List Status: <None>   Medication Order Taking? Sig Documenting Provider Last Dose Status Informant  amLODipine  (NORVASC ) 10 MG tablet [id number]  Take 1 tablet (10 mg total) by mouth daily. Newlin, Enobong, MD  Active Spouse/Significant Other  diphenhydramine -acetaminophen  (TYLENOL  PM) 25-500 MG TABS tablet [id number]  Take 1 tablet by mouth at bedtime as needed (for pain,sleep). [provider]  Active Spouse/Significant Other  docusate sodium  (COLACE) 100 MG capsule [id number]  Take 1 capsule (100 mg total) by mouth 2 (two) times daily.  Patient not taking: Reported on 01/13/2024   Cory Palma, PA-C  Active Spouse/Significant Other  DULoxetine  (CYMBALTA ) 60 MG capsule [id number]  Take 1 capsule (60 mg total) by mouth daily. For chronic pain  Patient taking differently: Take 60 mg by mouth daily. For chronic pain   Delbert Clam, MD  Active Spouse/Significant Other  Evolocumab  (REPATHA  SURECLICK) 140 MG/ML SOAJ [id number]  INJECT 140 MG INTO THE SKIN EVERY 14 (FOURTEEN) DAYS. Santo Stanly LABOR, MD  Active Spouse/Significant Other  HYDROcodone -acetaminophen  (NORCO/VICODIN) 5-325 MG tablet [id number]  Take 1-2 tablets by mouth every 6 (six) hours as needed for moderate pain (pain score 4-6) or severe pain (pain score 7-10).  Patient not taking: Reported on 01/13/2024   Cory Palma, PA-C  Active Spouse/Significant Other  lactulose  (CHRONULAC ) 10 GM/15ML solution [id number]  Take 15 mLs (10 g total) by mouth 3 (three) times daily. Newlin, Enobong, MD  Active Spouse/Significant Other  losartan -hydrochlorothiazide  (HYZAAR ) 100-25 MG tablet [id number]  Take 1 tablet by mouth daily. Newlin, Enobong, MD  Active Spouse/Significant Other  nitroGLYCERIN  (NITROSTAT ) 0.4 MG SL  tablet [id number]  Place 1 tablet (0.4 mg total) under the tongue every 5 (five) minutes as needed for chest pain. Newlin, Enobong, MD  Active Spouse/Significant Other  ranolazine  (RANEXA ) 500 MG 12 hr tablet [id number]  Take 1 tablet (500 mg total) by mouth 2 (two) times daily. Newlin, Enobong, MD  Active Spouse/Significant Other           Med Note (SATTERFIELD, DARIUS E   Fri Jan 06, 2024  2:49 PM)    rosuvastatin  (CRESTOR ) 40 MG tablet [id number]  Take 1 tablet (40 mg total) by mouth daily. Newlin, Enobong, MD  Active Spouse/Significant Other           Recommendation:   PCP Follow-up- as scheduled 01/26/24 Specialty provider follow-up: urology as scheduled 02/02/24 Continue Current Plan of Care  Follow Up Plan:   Telephone follow-up in 1 week- as scheduled 01/27/24  Pls call/ message for questions,  Stevana Dufner Mckinney Dink Creps, RN, BSN, CCRN Alumnus RN Care Manager  Transitions of Care  [facility] - [facility] [phone]: direct office

## 2024-01-20 NOTE — Patient Instructions (Signed)
 Visit Information  Thank you for taking time to visit with me today. Please don't hesitate to contact me if I can be of assistance to you before our next scheduled telephone appointment.  Our next appointment is by telephone on Friday, January 27, 2024 at 11:45 am  Please call the care guide team at [phone] if you need to cancel or reschedule your appointment.   Following are the goals we discussed today:  Patient Self Care Activities:  Attend all scheduled provider appointments Call provider office for new concerns or questions  Participate in Transition of Care Program/Attend TOC scheduled calls Take medications as prescribed   Continue pacing activity as your recuperation from recent surgery continues Continue cleaning and caring for your urinary catheter every day as you have been doing If you have any questions or concerns about your new drain: please call your surgeon right away If you believe your condition is getting worse- or you have any concerns: contact your care providers (doctors) promptly- reaching out to your doctor early when you have concerns can prevent you from having to go to the hospital  If you are experiencing a Mental Health or Behavioral Health Crisis or need someone to talk to, please  call the Suicide and Crisis Lifeline: 988 call the [place]  National Suicide Prevention Lifeline: (208) 499-6853 or TTY: 940-379-5140 TTY 337-733-8963) to talk to a trained counselor call 1-800-273-TALK (toll free, 24 hour hotline) go to [facility] [address] [place] [phone]) call the [facility] Crisis Line: [phone] call 911   Patient's spouse verbalizes understanding of instructions and care plan provided today and agrees to view in MyChart. Active MyChart status and patient understanding of how to access instructions and care plan via MyChart confirmed with patient.     Pls call/ message for questions,  [name]  [name] Aryahi Denzler, RN, BSN, CCRN Alumnus RN Care Manager  Transitions of Care  [facility] - [facility] [phone]: direct office

## 2024-01-26 ENCOUNTER — Ambulatory Visit: Payer: Self-pay | Attending: Family Medicine | Admitting: Family Medicine

## 2024-01-26 ENCOUNTER — Encounter: Payer: Self-pay | Admitting: Family Medicine

## 2024-01-26 VITALS — BP 150/78 | HR 49 | Temp 97.8°F | Ht 68.0 in | Wt 144.0 lb

## 2024-01-26 DIAGNOSIS — C61 Malignant neoplasm of prostate: Secondary | ICD-10-CM | POA: Diagnosis not present

## 2024-01-26 DIAGNOSIS — K5909 Other constipation: Secondary | ICD-10-CM

## 2024-01-26 DIAGNOSIS — I10 Essential (primary) hypertension: Secondary | ICD-10-CM

## 2024-01-26 DIAGNOSIS — Z09 Encounter for follow-up examination after completed treatment for conditions other than malignant neoplasm: Secondary | ICD-10-CM | POA: Diagnosis not present

## 2024-01-26 DIAGNOSIS — G6289 Other specified polyneuropathies: Secondary | ICD-10-CM

## 2024-01-26 DIAGNOSIS — F32A Depression, unspecified: Secondary | ICD-10-CM | POA: Diagnosis not present

## 2024-01-26 MED ORDER — LACTULOSE 10 GM/15ML PO SOLN: 10.0000 g | Freq: Three times a day (TID) | ORAL | 1 refills | Status: DC

## 2024-01-26 MED ORDER — CLOPIDOGREL BISULFATE 75 MG PO TABS: 75.0000 mg | ORAL_TABLET | Freq: Every day | ORAL | Status: AC

## 2024-01-26 NOTE — Patient Instructions (Signed)
 VISIT SUMMARY:  During your visit, we discussed the complications you are experiencing following your prostate cancer surgery. These include abdominal pain, numbness in your extremities, and issues with your Foley catheter. We also addressed your chronic pain, depression, and elevated blood pressure.  YOUR PLAN:  -POST-SURGICAL COMPLICATIONS: You are experiencing complications following your prostate cancer surgery, including a urinary tract infection with sepsis, ongoing use of a Foley catheter, and abdominal discomfort. We will check your blood count to assess for infection, monitor your Foley catheter drainage, and continue your follow-up with the urologist next week. Please report any issues with the catheter to your urologist and send a message via MyChart with your blood work results.  -CHRONIC PAIN AFTER PROSTATE SURGERY: You are experiencing abdominal discomfort and pain after your surgery, which may be contributing to your elevated blood pressure. Continue taking Tylenol  Extra Strength every 5-6 hours for pain management.  -PERIPHERAL NEUROPATHY: You are experiencing numbness in your toes and legs, which previously affected your fingers. This is likely due to nerve damage. Continue taking duloxetine  (Cymbalta ) for neuropathy and chronic pain, and monitor your symptoms and the effectiveness of the medication.  -DEPRESSION: Your depression has been exacerbated by your post-surgical recovery and inactivity. Continue taking duloxetine  (Cymbalta ), which should help with both your mood and neuropathy.  -ESSENTIAL HYPERTENSION: Your blood pressure is elevated, likely due to pain. Continue to monitor your blood pressure and pain levels.  -CONSTIPATION: Although you are not currently experiencing constipation, you have been prescribed lactulose  in the past, which has been effective. A new prescription for lactulose  will be sent to your pharmacy.  INSTRUCTIONS:  Please follow up with your  urologist next week and send a message via MyChart with your blood work results. Continue to monitor your blood pressure and pain levels, and report any issues with your Foley catheter to your urologist.

## 2024-01-26 NOTE — Progress Notes (Signed)
 Subjective:  Patient ID: [name], male    DOB: Jun 05, 1960  Age: 63 y.o. MRN: [id number]  CC: Hospitalization Follow-up     Discussed the use of AI scribe software for clinical note transcription with the patient, who gave verbal consent to proceed.  History of Present Illness [name] is a 63 year old male with a history of hypertrophic cardiomyopathy, CAD (currently on Ranexa ), hypertension, nicotine  dependence (35 pack years), prostate cancer ( Gleason score 3=4+7 in 2 cores, 3=3=6 in 1 core) pT3a N0 M0 R0 status post RALP weeks/ PLND in 11/2023 who presents for hospital follow-up.  He was recently hospitalized for sepsis secondary to UTI, blood cultures were positive for E. coli.  CT revealed fluid collection in the pelvis increased since prior hospitalization.  He was treated with antibiotics.  He also underwent fluid drainage of pelvic collection with tube placement and is status post vesicourethral anastomotic leak with bilateral ureteral stent placement.  Prior to his hospitalization he had previously had a malpositioned Foley catheter with reduced output which had to be changed by urology.  He experiences intermittent abdominal pain at the surgical site, which affects his activity level and contributes to depression. He takes Tylenol  Extra Strength every six hours, occasionally every five hours, for pain relief. He has no constipation or nausea, and his appetite remains good.  Urology follow-up within 2 weeks.  Numbness has progressed from his fingers to his toes and legs. He restarted duloxetine  for chronic pain and neuropathy yesterday. He has resumed Plavix , Ranexa , and Repatha .  His urine from the Foley catheter varies between cloudy and clear. He manages catheter changes himself and keeps records for his urologist. Accompanied by spouse to this visit.   Past Medical History:  Diagnosis Date   Apical variant hypertrophic cardiomyopathy (HCC)    CAD (coronary  artery disease)    a. LHC in 2011 with severe dz in D1/D2 and very distal LAD--> too small for PCI and managed medically.  b.  LHC in 10/2013 after an abnormal nuclear study w/ non-obst dz/c. 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   HOH (hard of hearing)    rgiht ear   Hyperlipidemia    Hypertension    Myocardial infarction [facility])    Peripheral vascular disease (HCC)    Stroke [facility])     Past Surgical History:  Procedure Laterality Date   CARDIAC CATHETERIZATION N/A 02/24/2016   Procedure: Left Heart Cath and Coronary Angiography;  Surgeon: Ozell Fell, MD;  Location: [facility] INVASIVE CV LAB;  Service: Cardiovascular;  Laterality: N/A;   CARDIAC CATHETERIZATION N/A 02/24/2016   Procedure: Coronary Stent Intervention;  Surgeon: Ozell Fell, MD;  Location[facility] INVASIVE CV LAB;  Service: Cardiovascular;  Laterality: N/A;   CORONARY ANGIOPLASTY WITH STENT PLACEMENT  2017   CORONARY BALLOON ANGIOPLASTY N/A 04/30/2019   Procedure: CORONARY BALLOON ANGIOPLASTY;  Surgeon: Swaziland, Peter M, MD;  Location: [facility] INVASIVE CV LAB;  Service: Cardiovascular;  Laterality: N/A;   CYSTOSCOPY W/ URETERAL STENT PLACEMENT Bilateral 01/08/2024   Procedure: CYSTOSCOPY, WITH RETROGRADE PYELOGRAM  AND URETERAL STENT INSERTION;  Surgeon: Selma Donnice SAUNDERS, MD;  Location: [facility] ORS;  Service: Urology;  Laterality: Bilateral;   INGUINAL HERNIA REPAIR Left    LEFT HEART CATH AND CORONARY ANGIOGRAPHY N/A 04/30/2019   Procedure: LEFT HEART CATH AND CORONARY ANGIOGRAPHY;  Surgeon: Cherrie Toribio SAUNDERS, MD;  Location: [facility] INVASIVE CV LAB;  Service: Cardiovascular;  Laterality:  N/A;   LEFT HEART CATH AND CORONARY ANGIOGRAPHY N/A 05/14/2019   Procedure: LEFT HEART CATH AND CORONARY ANGIOGRAPHY;  Surgeon: Swaziland, Peter M, MD;  Location: [facility] INVASIVE CV LAB;  Service: Cardiovascular;  Laterality: N/A;   LEFT HEART CATHETERIZATION WITH CORONARY ANGIOGRAM N/A 11/22/2013   Procedure: LEFT HEART CATHETERIZATION WITH CORONARY ANGIOGRAM;  Surgeon: Victory LELON Claudene DOUGLAS, MD;  Location: [facility] CATH LAB;  Service: Cardiovascular;  Laterality: N/A;   ROBOT ASSISTED LAPAROSCOPIC RADICAL PROSTATECTOMY N/A 12/26/2023   Procedure: PROSTATECTOMY, RADICAL, ROBOT-ASSISTED, LAPAROSCOPIC;  Surgeon: Selma Donnice SAUNDERS, MD;  Location: [facility] ORS;  Service: Urology;  Laterality: N/A;   ROBOT ASSISTED LAPAROSCOPIC RADICAL PROSTATECTOMY N/A 01/08/2024   Procedure: PROSTATECTOMY, RADICAL, ROBOT-ASSISTED, LAPAROSCOPIC;  Surgeon: Selma Donnice SAUNDERS, MD;  Location: [facility] ORS;  Service: Urology;  Laterality: N/A;  Possible robotic repair of vesicourethral anastomosis   TEE WITHOUT CARDIOVERSION N/A 08/22/2015   Procedure: TRANSESOPHAGEAL ECHOCARDIOGRAM (TEE);  Surgeon: Aleene JINNY Passe, MD;  Location: [facility] ENDOSCOPY;  Service: Cardiovascular;  Laterality: N/A;    Family History  Problem Relation Age of Onset   Hypertension Mother    Heart failure Mother    Hypertension Father    Colon cancer Father 76   Hypertension Brother    Heart disease Son    Heart disease Son    Colon cancer Paternal Uncle     Social History   Socioeconomic History   Marital status: Married    Spouse name: Not on file   Number of children: 3   Years of education: Not on file   Highest education level: Not on file  Occupational History   Occupation: [employer]  Tobacco Use   Smoking status: Former    Current packs/day: 1.50    Types: Cigarettes   Smokeless tobacco: Never  Vaping Use   Vaping status: Never Used  Substance and Sexual Activity   Alcohol use: No    Alcohol/week: 0.0 standard drinks of alcohol   Drug use:  No   Sexual activity: Not Currently  Other Topics Concern   Not on file  Social History Narrative   18 GRANDKIDS!!!   Social Drivers of Health   Financial Resource Strain: Medium Risk (03/09/2023)   Overall Financial Resource Strain (CARDIA)    Difficulty of Paying Living Expenses: Somewhat hard  Food Insecurity: No Food Insecurity (01/13/2024)   Hunger Vital Sign    Worried About Running Out of Food in the Last Year: Never true    Ran Out of Food in the Last Year: Never true  Recent Concern: Food Insecurity - Food Insecurity Present (12/26/2023)   Hunger Vital Sign    Worried About Running Out of Food in the Last Year: Often true    Ran Out of Food in the Last Year: Often true  Transportation Needs: No Transportation Needs (01/13/2024)   PRAPARE - Administrator, Civil Service (Medical): No    Lack of Transportation (Non-Medical): No  Physical Activity: Inactive (03/09/2023)   Exercise Vital Sign    Days of Exercise per Week: 0 days    Minutes of Exercise per Session:  0 min  Stress: Stress Concern Present (03/09/2023)   [employer] of Occupational Health - Occupational Stress Questionnaire    Feeling of Stress : To some extent  Social Connections: Moderately Isolated (12/26/2023)   Social Connection and Isolation Panel    Frequency of Communication with Friends and Family: Once a week    Frequency of Social Gatherings with Friends and Family: Never    Attends Religious Services: Never    Database administrator or Organizations: Yes    Attends Engineer, structural: 1 to 4 times per year    Marital Status: Married    Allergies  Allergen Reactions   Lisinopril Swelling    lips swelling    Outpatient Medications Prior to Visit  Medication Sig Dispense Refill   amLODipine  (NORVASC ) 10 MG tablet Take 1 tablet (10 mg total) by mouth daily. 90 tablet 1   diphenhydramine -acetaminophen  (TYLENOL  PM) 25-500 MG TABS tablet Take 1 tablet by mouth at bedtime as  needed (for pain,sleep).     DULoxetine  (CYMBALTA ) 60 MG capsule Take 1 capsule (60 mg total) by mouth daily. For chronic pain (Patient taking differently: Take 60 mg by mouth daily. For chronic pain) 90 capsule 1   Evolocumab  (REPATHA  SURECLICK) 140 MG/ML SOAJ INJECT 140 MG INTO THE SKIN EVERY 14 (FOURTEEN) DAYS. 6 mL 3   losartan -hydrochlorothiazide  (HYZAAR ) 100-25 MG tablet Take 1 tablet by mouth daily. 90 tablet 1   nitroGLYCERIN  (NITROSTAT ) 0.4 MG SL tablet Place 1 tablet (0.4 mg total) under the tongue every 5 (five) minutes as needed for chest pain. 25 tablet 3   ranolazine  (RANEXA ) 500 MG 12 hr tablet Take 1 tablet (500 mg total) by mouth 2 (two) times daily. 180 tablet 1   rosuvastatin  (CRESTOR ) 40 MG tablet Take 1 tablet (40 mg total) by mouth daily. 90 tablet 1   lactulose  (CHRONULAC ) 10 GM/15ML solution Take 15 mLs (10 g total) by mouth 3 (three) times daily. 946 mL 0   docusate sodium  (COLACE) 100 MG capsule Take 1 capsule (100 mg total) by mouth 2 (two) times daily. (Patient not taking: Reported on 01/13/2024)     HYDROcodone -acetaminophen  (NORCO/VICODIN) 5-325 MG tablet Take 1-2 tablets by mouth every 6 (six) hours as needed for moderate pain (pain score 4-6) or severe pain (pain score 7-10). (Patient not taking: Reported on 01/13/2024) 20 tablet 0   No facility-administered medications prior to visit.     ROS Review of Systems  Constitutional:  Negative for activity change and appetite change.  HENT:  Negative for sinus pressure and sore throat.   Respiratory:  Negative for chest tightness, shortness of breath and wheezing.   Cardiovascular:  Negative for chest pain and palpitations.  Gastrointestinal:  Positive for abdominal pain and constipation. Negative for abdominal distention.  Genitourinary: Negative.   Musculoskeletal: Negative.   Psychiatric/Behavioral:  Positive for dysphoric mood. Negative for behavioral problems.     Objective:  BP (!) 150/78   Pulse (!) 49    Temp 97.8 F (36.6 C) (Oral)   Ht 5' 8 (1.727 m)   Wt 144 lb (65.3 kg)   SpO2 100%   BMI 21.90 kg/m      01/26/2024   10:35 AM 01/26/2024    9:56 AM 01/12/2024    9:23 AM  BP/Weight  Systolic BP 150 154 130  Diastolic BP 78 75 78  Wt. (Lbs)  144   BMI  21.9 kg/m2       Physical Exam Constitutional:  Appearance: He is well-developed.  Cardiovascular:     Rate and Rhythm: Bradycardia present.     Heart sounds: Normal heart sounds. No murmur heard. Pulmonary:     Effort: Pulmonary effort is normal.     Breath sounds: Normal breath sounds. No wheezing or rales.  Chest:     Chest wall: No tenderness.  Abdominal:     General: Bowel sounds are normal. There is no distension.     Palpations: Abdomen is soft. There is no mass.     Tenderness: There is no abdominal tenderness.     Comments: Flank with drain with minimal drainage. Healing infraumbilical surgical scar  Musculoskeletal:        General: Normal range of motion.     Right lower leg: No edema.     Left lower leg: No edema.  Neurological:     Mental Status: He is alert and oriented to person, place, and time.  Psychiatric:     Comments: Dysphoric mood        Latest Ref Rng & Units 01/12/2024    3:10 AM 01/11/2024    3:16 AM 01/10/2024    3:12 AM  CMP  Glucose 70 - 99 mg/dL 894  884  84   BUN 8 - 23 mg/dL 14  17  20    Creatinine 0.61 - 1.24 mg/dL 8.95  8.93  8.88   Sodium 135 - 145 mmol/L 131  135  136   Potassium 3.5 - 5.1 mmol/L 3.9  3.7  3.4   Chloride 98 - 111 mmol/L 96  99  105   CO2 22 - 32 mmol/L 27  27  24    Calcium  8.9 - 10.3 mg/dL 8.5  8.9  8.2     Lipid Panel     Component Value Date/Time   CHOL 102 06/23/2022 0902   TRIG 36 06/23/2022 0902   HDL 60 06/23/2022 0902   CHOLHDL 1.7 06/23/2022 0902   CHOLHDL 1.8 05/12/2019 0317   VLDL 7 05/12/2019 0317   LDLCALC 32 06/23/2022 0902    CBC    Component Value Date/Time   WBC 9.2 01/12/2024 0310   RBC 2.72 (L) 01/12/2024 0310   HGB  8.1 (L) 01/12/2024 0310   HGB 12.0 (L) 08/25/2022 0949   HCT 25.2 (L) 01/12/2024 0310   HCT 36.3 (L) 08/25/2022 0949   PLT 666 (H) 01/12/2024 0310   PLT 288 08/25/2022 0949   MCV 92.6 01/12/2024 0310   MCV 90 08/25/2022 0949   MCH 29.8 01/12/2024 0310   MCHC 32.1 01/12/2024 0310   RDW 13.4 01/12/2024 0310   RDW 11.9 08/25/2022 0949   LYMPHSABS 0.7 01/06/2024 1215   LYMPHSABS 2.1 10/08/2021 1525   MONOABS 0.5 01/06/2024 1215   EOSABS 0.0 01/06/2024 1215   EOSABS 0.2 10/08/2021 1525   BASOSABS 0.0 01/06/2024 1215   BASOSABS 0.0 10/08/2021 1525    Lab Results  Component Value Date   HGBA1C 5.3 04/29/2019       Assessment & Plan Prostate cancer  Status post radical robotic assisted laparoscopic prostatectomy with bilateral lymph node dissection Post-surgical complications include urinary tract infection with sepsis, ongoing Foley catheter use, and abdominal discomfort. Foley catheter drainage is variable, and there is a drainage tube from the second surgery. - Check blood count to assess for infection. - Monitor Foley catheter drainage and report any issues to urologist. - Continue follow-up with urologist next week. - Send message via MyChart with blood work results.  Chronic pain after prostate surgery Experiencing abdominal discomfort and pain post-surgery. Pain may be contributing to elevated blood pressure. - Continue Tylenol  Extra Strength for pain management every 5-6 hours.  Peripheral neuropathy Experiencing numbness in toes and legs, previously in fingers. Recently restarted duloxetine  (Cymbalta ) for chronic pain and neuropathy. - Continue duloxetine  (Cymbalta ) for neuropathy and chronic pain. - Monitor symptoms and effectiveness of duloxetine .  Depression Depression exacerbated by post-surgical recovery and inactivity. Duloxetine  (Cymbalta ) recently restarted, which should help with mood and neuropathy. - Continue duloxetine  (Cymbalta ) for  depression.  Essential hypertension Blood pressure is elevated at 154/75, likely due to pain. Previous readings were normal. - Monitor blood pressure and pain levels.  Constipation No current constipation reported. Lactulose  prescribed previously has been effective. - Send lactulose  prescription to pharmacy.  Hospital discharge follow-up Hospitalization for sepsis secondary to E. coli bacteremia from UTI Completed antibiotics Vitals are stable Will send of CBC   Meds ordered this encounter  Medications   lactulose  (CHRONULAC ) 10 GM/15ML solution    Sig: Take 15 mLs (10 g total) by mouth 3 (three) times daily.    Dispense:  946 mL    Refill:  1   clopidogrel  (PLAVIX ) 75 MG tablet    Sig: Take 1 tablet (75 mg total) by mouth daily.    Follow-up: Return for previously scheduled appointment.       Corrina Sabin, MD, FAAFP. [facility] [place], [place] [phone]   01/26/2024, 2:03 PM

## 2024-01-27 ENCOUNTER — Other Ambulatory Visit: Payer: Self-pay | Admitting: *Deleted

## 2024-01-27 ENCOUNTER — Ambulatory Visit: Payer: Self-pay | Admitting: Family Medicine

## 2024-01-27 LAB — CBC WITH DIFFERENTIAL/PLATELET
Basophils Absolute: 0.1 x10E3/uL (ref 0.0–0.2)
Basos: 1 %
EOS (ABSOLUTE): 0.3 x10E3/uL (ref 0.0–0.4)
Eos: 5 %
Hematocrit: 32.2 % — ABNORMAL LOW (ref 37.5–51.0)
Hemoglobin: 10.1 g/dL — ABNORMAL LOW (ref 13.0–17.7)
Immature Grans (Abs): 0 x10E3/uL (ref 0.0–0.1)
Immature Granulocytes: 0 %
Lymphocytes Absolute: 2.3 x10E3/uL (ref 0.7–3.1)
Lymphs: 37 %
MCH: 29.4 pg (ref 26.6–33.0)
MCHC: 31.4 g/dL — ABNORMAL LOW (ref 31.5–35.7)
MCV: 94 fL (ref 79–97)
Monocytes Absolute: 0.4 x10E3/uL (ref 0.1–0.9)
Monocytes: 6 %
Neutrophils Absolute: 3.2 x10E3/uL (ref 1.4–7.0)
Neutrophils: 51 %
Platelets: 476 x10E3/uL — ABNORMAL HIGH (ref 150–450)
RBC: 3.44 x10E6/uL — ABNORMAL LOW (ref 4.14–5.80)
RDW: 12.8 % (ref 11.6–15.4)
WBC: 6.3 x10E3/uL (ref 3.4–10.8)

## 2024-01-27 NOTE — Transitions of Care (Post Inpatient/ED Visit) (Signed)
 Transition of Care week 3/ day # 14  Visit Note  01/27/2024  Name: [name] MRN: [id number]          DOB: 1960-07-22  Situation: Patient enrolled in [facility] 30-day program. Visit completed with patient's spouse [name] by telephone.   HIPAA identifiers x 2 verified  Background:  Recent hospitalization August 8-14, 2025 for UTI sepsis following recent planned surgical radical prostatectomy 12/27/23 with post-op complications  (1) unplanned hospital admission and (1) observation hospital admission x last (12) and (6) months Independent at baseline: no assistive devices, drives self / works full time prior to recent surgery; resides with supportive spouse who assists as indicated: provides care for minor child (69 year old nephew)  Initial Transition Care Management Follow-up Telephone Call    Past Medical History:  Diagnosis Date   Apical variant hypertrophic cardiomyopathy (HCC)    CAD (coronary artery disease)    a. LHC in 2011 with severe dz in D1/D2 and very distal LAD--> too small for PCI and managed medically.  b.  LHC in 10/2013 after an abnormal nuclear study w/ non-obst dz/c. 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   HOH (hard of hearing)    rgiht ear   Hyperlipidemia    Hypertension    Myocardial infarction [facility])    Peripheral vascular disease (HCC)    Stroke [facility])    Assessment:  Per spouse:  He is still doing better, had a real good visit with Dr. Newlin yesterday- she said everything looks good and she had no concerns.  Still managing the catheter without any problems; and the drain in his side is still not  draining anything any more.  He is not in pain- just a little uncomfortable every now and then.  His spirits are a little down, but I think that is because he is worried about whether the cancer is gone or not- I think that will imrpove if he gets a good report from the urology doctor next week when they will go over the CT scan results with him    Spouse denies clinical concerns and reports patient in no distress throughout [facility] 30-day program outreach call today  Patient Reported Symptoms: Cognitive Cognitive Status: Normal speech and language skills, Alert and oriented to person, place, and time, Insightful and able to interpret abstract concepts (per spouse/ caregiver) Cognitive/Intellectual Conditions Management [RPT]: None reported or documented in medical history or problem list      Neurological      HEENT        Cardiovascular Cardiovascular Symptoms Reported: No symptoms reported (per spouse/ caregiver) Does patient have uncontrolled Hypertension?: No Cardiovascular Management Strategies: Routine screening, Medication therapy, Coping strategies, Adequate rest  Respiratory Respiratory Symptoms Reported:  (per spouse/ caregiver) Other Respiratory Symptoms: Spouse denies patient shortness of breath/ cough Respiratory Management Strategies: Adequate rest  Endocrine Endocrine Symptoms Reported: No symptoms reported (per spouse/ caregiver) Is patient diabetic?: No    Gastrointestinal Gastrointestinal Symptoms Reported: No symptoms reported (per spouse/ caregiver) Other Gastrointestinal Symptoms: per spouse/ caregiver- continues taking lactulose  regularly  as prescribed: it is working great, no problems with him going to the bathroom- he goes several times a day, and Dr. Delbert told him yesterday to continue taking it like he has been; confirmed continues with good appetite- eating good Additional Gastrointestinal Details: Confirmed JP drain remains in place; per spouse: it stopped  draining anything at all awhile ago; Dr. Delbert said it looks great- we hope they take it out when he goes to the urologist next week Spouse continues to monitor JP site daily, denies signs/ symptoms infection at site, continues to verbalize understanding of same along with action plan without additional promtping; confirmed plans to attend upcoming CT scan on 01/31/24 and urology provider office visit on 02/02/24 Gastrointestinal Management Strategies: Coping strategies    Genitourinary Genitourinary Symptoms Reported: Other (per spouse/ caregiver report) Other Genitourinary Symptoms: Confirmed foley catheter remains intact and draining clear yellow urine; spouse reports Dr. Delbert had no concerns yesterday; confirmed they changed the collection bag this week; continues to report they are managing foley catheter at home without difficulty; reinforced signs/ symptoms infection along with corresponding action plan and spouse verbalizes same without additional prompting- confirmed plans to attend upcoming CT scan 01/31/24 and urology provider office visit 02/02/24 Additional Genitourinary Details: Recent surgical radical prostatectomy 12/27/23 with post-op complications Genitourinary Management Strategies: Catheter, indwelling, Medication therapy, Medical device, Adequate rest, Coping strategies  Integumentary Integumentary Symptoms Reported: Incision (per spouse/ caregiver) Other Integumentary Symptoms: Continues t report incisional (R) flank- abdomen puncture sites completely healed; reports Dr. Delbert was very pleased with how they looked yesterday; Reinforced signs/ symptoms incisional infection along with corresponding action plan and spouse remains able to verbalize without additional prompting- confirms she continues monitoring sites daily Skin Management Strategies: Routine screening, Dressing changes, Coping strategies  Musculoskeletal Musculoskelatal Symptoms Reviewed: Limited mobility (per spouse/  caregiver) Additional Musculoskeletal Details: confirmed currently requiring/ using assistive devices for ambulation - cane prn only when he goes outside of the house confirmed no new/ recent falls; provided reinforcement around fall prevention Musculoskeletal Management Strategies: Coping strategies, Medical device      Psychosocial Psychosocial Symptoms Reported: Other (per spouse/ caregiver) Other Psychosocial Conditions: per spouse/ caregiver: he is not depressed necessarily- he is just a little off, pre-occupied because he doesn't know what to expect from the CT scan- I think he just wants to hear that he is not having any more cancer; I think if he gets a good report from the urologist, his spirits will improve; Dr. Bonita said for him to continue taking the antidepressant; Offered to place CSW referral for mental health resources/ assessment: spouse declines: let's wait until we see what they say at the urology doctor appointment next week Behavioral Management Strategies: Support system Major Change/Loss/Stressor/Fears (CP): Medical condition, self Techniques to Cope with Loss/Stress/Change: Diversional activities Quality of Family Relationships: helpful, involved, supportive   There were no vitals filed for this visit.  Medications Reviewed Today     Reviewed by Consepcion Utt M, RN (Registered Nurse) on 01/27/24 at 1147  Med List Status: <None>   Medication Order Taking? Sig Documenting Provider Last Dose Status Informant  amLODipine  (NORVASC ) 10 MG tablet [id number]  Take 1 tablet (10 mg total) by mouth daily. Newlin, Enobong, MD  Active Spouse/Significant Other  clopidogrel  (PLAVIX ) 75 MG tablet [id number]  Take 1 tablet (75 mg total) by mouth daily. Newlin, Enobong, MD  Active   diphenhydramine -acetaminophen  (TYLENOL  PM) 25-500 MG TABS tablet [id number]  Take 1 tablet by mouth at bedtime as needed (  for pain,sleep). [provider]  Active Spouse/Significant Other   docusate sodium  (COLACE) 100 MG capsule [id number]  Take 1 capsule (100 mg total) by mouth 2 (two) times daily.  Patient not taking: Reported on 01/13/2024   Cory Palma, PA-C  Active Spouse/Significant Other  DULoxetine  (CYMBALTA ) 60 MG capsule [id number]  Take 1 capsule (60 mg total) by mouth daily. For chronic pain  Patient taking differently: Take 60 mg by mouth daily. For chronic pain   Delbert Clam, MD  Active Spouse/Significant Other  Evolocumab  (REPATHA  SURECLICK) 140 MG/ML SOAJ [id number]  INJECT 140 MG INTO THE SKIN EVERY 14 (FOURTEEN) DAYS. Santo Stanly LABOR, MD  Active Spouse/Significant Other  HYDROcodone -acetaminophen  (NORCO/VICODIN) 5-325 MG tablet [id number]  Take 1-2 tablets by mouth every 6 (six) hours as needed for moderate pain (pain score 4-6) or severe pain (pain score 7-10).  Patient not taking: Reported on 01/13/2024   Cory Palma, PA-C  Active Spouse/Significant Other  lactulose  (CHRONULAC ) 10 GM/15ML solution [id number]  Take 15 mLs (10 g total) by mouth 3 (three) times daily. Newlin, Enobong, MD  Active   losartan -hydrochlorothiazide  (HYZAAR ) 100-25 MG tablet [id number]  Take 1 tablet by mouth daily. Newlin, Enobong, MD  Active Spouse/Significant Other  nitroGLYCERIN  (NITROSTAT ) 0.4 MG SL tablet [id number]  Place 1 tablet (0.4 mg total) under the tongue every 5 (five) minutes as needed for chest pain. Newlin, Enobong, MD  Active Spouse/Significant Other  ranolazine  (RANEXA ) 500 MG 12 hr tablet [id number]  Take 1 tablet (500 mg total) by mouth 2 (two) times daily. Newlin, Enobong, MD  Active Spouse/Significant Other           Med Note (SATTERFIELD, DARIUS E   Fri Jan 06, 2024  2:49 PM)    rosuvastatin  (CRESTOR ) 40 MG tablet [id number]  Take 1 tablet (40 mg total) by mouth daily. Newlin, Enobong, MD  Active Spouse/Significant Other           Recommendation:   Specialty provider follow-up- urology provider office visit as scheduled 02/02/24 (CT scan 01/31/24) Continue  Current Plan of Care  Follow Up Plan:   Telephone follow-up in 1 week  Pls call/ message for questions,  Theona Muhs Mckinney Joyell Emami, RN, BSN, CCRN Alumnus RN Care Manager  Transitions of Care  [facility] [phone]: direct office

## 2024-01-27 NOTE — Patient Instructions (Signed)
 Visit Information  Thank you for taking time to visit with me today. Please don't hesitate to contact me if I can be of assistance to you before our next scheduled telephone appointment.  Our next appointment is by telephone on Friday, February 03, 2024 at 11:45 am  Please call the care guide team at [phone] if you need to cancel or reschedule your appointment.   Following are the goals we discussed today:  Patient Self Care Activities:  Attend all scheduled provider appointments Call provider office for new concerns or questions  Participate in Transition of Care Program/Attend TOC scheduled calls Take medications as prescribed   Continue pacing activity as your recuperation from recent surgery continues Continue cleaning and caring for your urinary catheter every day as you have been doing If you have any questions or concerns about your new drain: please call your surgeon right away Use assistive devices as needed to prevent falls- your cane If you believe your condition is getting worse- or you have any concerns: contact your care providers (doctors) promptly- reaching out to your doctor early when you have concerns can prevent you from having to go to the hospital  If you are experiencing a Mental Health or Behavioral Health Crisis or need someone to talk to, please  call the Suicide and Crisis Lifeline: 988 call the [place]  National Suicide Prevention Lifeline: 339 277 8778 or TTY: 803-120-7063 TTY (850)312-8641) to talk to a trained counselor call 1-800-273-TALK (toll free, 24 hour hotline) go to [facility] [address], [place] [phone]) call the [facility] Crisis Line: 7183638665 call 911   Patient verbalizes understanding of instructions and care plan provided today and agrees to view in MyChart. Active MyChart status and patient understanding of how to access instructions and care plan via MyChart confirmed with  patient.      Debora Stockdale Mckinney Cumi Sanagustin, RN, BSN, Media planner  Transitions of Care  [facility] [phone]: direct office

## 2024-02-01 ENCOUNTER — Ambulatory Visit: Payer: Self-pay

## 2024-02-01 NOTE — Telephone Encounter (Signed)
 Please contact if provider would like to add additional advise.  FYI Only or Action Required?: Action required by provider: clinical question for provider and update on patient condition.  Patient was last seen in primary care on 01/26/2024 by Newlin, Enobong, MD.  Called Nurse Triage reporting Constipation.  Symptoms began several days ago.  Interventions attempted: Rest, hydration, or home remedies.  Symptoms are: gradually worsening.  Triage Disposition: Discuss With PCP and Callback by Nurse Today (overriding Home Care)  Patient/caregiver understands and will follow disposition?: Yes Reason for Disposition  Over-The-Counter (OTC) medicines for constipation, questions about  Answer Assessment - Initial Assessment Questions Patient's wife reports that he missed 1 1/2 days of his lactulose  and has developed constipation. Denies abdominal distention, nausea, vomiting. Reports some lower abdominal discomfort. Patient's wife states that they have dulcolax and an enema at home. Patient has re-started lactulose  today.   Patient's wife states she will attempt the enema and if no result, will have him seen in UC or the ED. Advised to go to the ED if he develops: worsening abdominal discomfort, distension, nausea, vomiting. Advised to keep patient hydrated.   STOOL PATTERN OR FREQUENCY: How often do you have a bowel movement (BM)?  (Normal range: 3 times a day to every 3 days)  When was your last BM?       Last BM was 3 days ago  ONSET: When did the constipation begin?     3 days ago  BLOOD ON STOOLS: Has there been any blood on the toilet tissue or on the surface of the BM? If Yes, ask: When was the last time?     Denies  CHRONIC CONSTIPATION: Is this a new problem for you?  If No, ask: How long have you had this problem? (days, weeks, months)      Yes, is on lactulose  chronically d/t constipation from medications  CHANGES IN DIET OR HYDRATION: Have there been any recent  changes in your diet? How much fluids are you drinking on a daily basis?  How much have you had to drink today?      LAXATIVES: Have you been using any stool softeners, laxatives, or enemas?  If Yes, ask What are you using, how often, and when was the last time?       Stated missed 1.5 days of lactulose , has restarted  Protocols used: Constipation-A-AH Copied from CRM #[id number]. Topic: Clinical - Red Word Triage >> Feb 01, 2024  4:10 PM Marissa P wrote: Red Word that prompted transfer to Nurse Triage: lactulose  (CHRONULAC ) 10 GM/15ML solution he was taking it then he didn't take it for a day in a half and he is now constipated and wondering what he can take.

## 2024-02-03 ENCOUNTER — Other Ambulatory Visit: Payer: Self-pay | Admitting: *Deleted

## 2024-02-03 NOTE — Transitions of Care (Post Inpatient/ED Visit) (Signed)
 Transition of Care week 4/ day # 21  Visit Note  02/03/2024  Name: [name] MRN: [id number]          DOB: [birth year]  Situation: Patient enrolled in [facility] 30-day program. Visit completed with patient's spouse [name]- verified on [facility] DPR by telephone.   HIPAA identifiers x 2 verified  Background:  Recent hospitalization August 8-14, 2025 for UTI sepsis following recent planned surgical radical prostatectomy 12/27/23 with post-op complications  (1) unplanned hospital admission and (1) observation hospital admission x last (12) and (6) months Independent at baseline: no assistive devices, drives self / works full time prior to recent surgery; resides with supportive spouse who assists as indicated: provides care for minor child (58 year old nephew)  Initial Transition Care Management Follow-up Telephone Call    Past Medical History:  Diagnosis Date   Apical variant hypertrophic cardiomyopathy (HCC)    CAD (coronary artery disease)    a. LHC in 2011 with severe dz in D1/D2 and very distal LAD--> too small for PCI and managed medically.  b.  LHC in 10/2013 after an abnormal nuclear study w/ non-obst dz/c. 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   HOH (hard of hearing)    rgiht ear   Hyperlipidemia    Hypertension    Myocardial infarction [facility])    Peripheral vascular disease (HCC)    Stroke (HCC)    Assessment:  spouse:  He is still doing better, had a good visit with the urologist yesterday- he was hoping to get the drain and the catheter out- but they said based on the CT scan, they both need to stay in for about 2 more  weeks, to make sure all the infection is completely gone- he wasn't happy about that, but he is okay- he knows he needs to get completely healed, so he can get back to work eventually.  His spirits are better, so we don't need to talk to a Child psychotherapist at this point.  No problems with managing the drain or the catheter at home.  He had a bout of constipation earlier this week, but that has completely resolved now and he is going to the bathroom normally    Spouse denies clinical concerns and reports patient in no distress throughout [facility] 30-day program outreach call today  Patient Reported Symptoms: Cognitive Cognitive Status: No symptoms reported, Normal speech and language skills, Alert and oriented to person, place, and time, Insightful and able to interpret abstract concepts (per spouse report) Cognitive/Intellectual Conditions Management [RPT]: None reported or documented in medical history or problem list      Neurological Neurological Review of Symptoms: Numbness (per spouse report) Oher Neurological Symptoms/Conditions [RPT]: Reports intermittent ongoing tingling/ numbness- hasn't been so bad lately confirmed this was discussed with PCP at recent office visit; we are watching it Neurological Management Strategies: Coping strategies  HEENT HEENT Symptoms Reported: No symptoms reported (per spouse report)      Cardiovascular Cardiovascular Symptoms Reported: No symptoms reported (per spouse report) Does patient have uncontrolled Hypertension?: No Cardiovascular Management Strategies:  Routine screening, Coping strategies, Medication therapy, Adequate rest  Respiratory Respiratory Symptoms Reported: No symptoms reported (per spouse report) Other Respiratory Symptoms: Reports He is breathing fine, no cough, no shortness of breath Respiratory Management Strategies: Adequate rest  Endocrine Endocrine Symptoms Reported: No symptoms reported (per spouse report) Is patient diabetic?: No     Gastrointestinal Gastrointestinal Symptoms Reported: No symptoms reported (per spouse report) Other Gastrointestinal Symptoms: He had a bout of constipation earlier this week, but we called Dr. Newlin and they told us  what to do- that has completely resolved now, he is back to going to the bathroom normally again  Reports eating good; Reviewed recent urology provider appointment 02/02/24 with spouse: she reports they said the drain (flank: JP) needs to stay in place for another 2 weeks or so, based on the CT scan- it is still not draining much at all, we don't even have to empty it very often-- they just want to make sure the infection has completely gone away before they remove it; Repports the area the drain goes into his skin looks fine: no redness at all; Reinforced signs/ symptoms JP drain site infection- spouse denies all and verbalizes very good undertanding of same along with corresponding action plan Gastrointestinal Management Strategies: Coping strategies    Genitourinary Genitourinary Symptoms Reported: Other (per spouse report) Other Genitourinary Symptoms: Reviewed urology provider appointment 02/02/24: spouse reports foley catheter will stay in for about another 2 weeks, based on the CT scan they did; they repositioned the catheter; it is still draining yellow urine, the urine is kind of dark looking, but the urologist was not concerned about that  Reinforced care of foley catheter at home- spouse able to verbalize without additional prompting, as well as signs/ symptoms UTI along with correspong action plan for same Additional Genitourinary Details: Recent radical prostatectomy 12/27/23 with post-op complications Genitourinary Management Strategies: Catheter, indwelling, Medical device, Medication therapy, Coping strategies, Adequate rest  Integumentary Integumentary Symptoms Reported: Incision (per spouse report) Other Integumentary Symptoms: Reprts the puncture incisions are now  completely closed up and healed Skin Management Strategies: Routine screening, Coping strategies  Musculoskeletal Musculoskelatal Symptoms Reviewed: Limited mobility Additional Musculoskeletal Details: confirmed currently requiring/ using assistive devices for ambulation - cane only when we leave the house; confirmed no new/ recent falls: provided education/ reinforcement around fall prevention        Psychosocial Psychosocial Symptoms Reported: No symptoms reported (per spouse report) Other Psychosocial Conditions: His spirits are better; I don't think we need to talk to a Child psychotherapist, but we will ask Dr. Newlin about it if anything changes in the future- he just has periods of feeling down because he is tired of all of this- he wants to move forward and have this all over with Additional Psychological Details: Again offered VBCI LCSW referral: spouse declined Behavioral Management Strategies: Support system Major Change/Loss/Stressor/Fears (CP): Medical condition, self Techniques to Cope with Loss/Stress/Change: Diversional activities Quality of Family Relationships: supportive, involved, helpful   There were no vitals filed for this visit.  Medications Reviewed Today     Reviewed by Damario Gillie M, RN (Registered Nurse) on 02/03/24 at 1151  Med List Status: <None>   Medication Order Taking? Sig Documenting Provider Last Dose Status Informant  amLODipine  (NORVASC ) 10 MG tablet [id number]  Take 1 tablet (10 mg total) by mouth daily. Newlin, Enobong, MD  Active Spouse/Significant Other  clopidogrel  (PLAVIX ) 75 MG tablet [id number]  Take 1 tablet (75 mg total) by mouth daily. Newlin, Enobong, MD  Active   diphenhydramine -acetaminophen  (TYLENOL  PM) 25-500 MG TABS tablet [id number]  Take 1 tablet by mouth at bedtime as needed (for pain,sleep). [provider]  Active Spouse/Significant Other  docusate sodium  (COLACE) 100 MG capsule [id number]  Take 1 capsule (100 mg total) by mouth  2 (two) times daily.  Patient not taking: Reported on 01/13/2024   Cory Palma, PA-C  Active Spouse/Significant Other  DULoxetine  (CYMBALTA ) 60 MG capsule [id number]  Take 1 capsule (60 mg total) by mouth daily. For chronic pain  Patient taking differently: Take 60 mg by mouth daily. For chronic pain   Delbert Clam, MD  Active Spouse/Significant Other  Evolocumab  (REPATHA  SURECLICK) 140 MG/ML SOAJ [id number]  INJECT 140 MG INTO THE SKIN EVERY 14 (FOURTEEN) DAYS. Santo Stanly LABOR, MD  Active Spouse/Significant Other  HYDROcodone -acetaminophen  (NORCO/VICODIN) 5-325 MG tablet [id number]  Take 1-2 tablets by mouth every 6 (six) hours as needed for moderate pain (pain score 4-6) or severe pain (pain score 7-10).  Patient not taking: Reported on 01/13/2024   Cory Palma, PA-C  Active Spouse/Significant Other  lactulose  (CHRONULAC ) 10 GM/15ML solution [id number]  Take 15 mLs (10 g total) by mouth 3 (three) times daily. Newlin, Enobong, MD  Active   losartan -hydrochlorothiazide  (HYZAAR ) 100-25 MG tablet [id number]  Take 1 tablet by mouth daily. Newlin, Enobong, MD  Active Spouse/Significant Other  nitroGLYCERIN  (NITROSTAT ) 0.4 MG SL tablet [id number]  Place 1 tablet (0.4 mg total) under the tongue every 5 (five) minutes as needed for chest pain. Newlin, Enobong, MD  Active Spouse/Significant Other  ranolazine  (RANEXA ) 500 MG 12 hr tablet [id number]  Take 1 tablet (500 mg total) by mouth 2 (two) times daily. Newlin, Enobong, MD  Active Spouse/Significant Other           Med Note (SATTERFIELD, DARIUS E   Fri Jan 06, 2024  2:49 PM)    rosuvastatin  (CRESTOR ) 40 MG tablet [id number]  Take 1 tablet (40 mg total) by mouth daily. Newlin, Enobong, MD  Active Spouse/Significant Other           Recommendation:   Continue Current Plan of Care  Follow Up Plan:   Telephone follow-up in 1 week- as scheduled 02/09/24  Pls call/ message for questions,  Clarence Dunsmore Mckinney Jerrel Tiberio, RN, BSN, CCRN Alumnus RN Care  Manager  Transitions of Care  [facility] - [facility] [phone]: direct office

## 2024-02-03 NOTE — Patient Instructions (Signed)
 Visit Information  Thank you for taking time to visit with me today. Please don't hesitate to contact me if I can be of assistance to you before our next scheduled telephone appointment.  Our next appointment is by telephone on Thursday, February 09, 2024 at 11:15 am with nurse Andrea  Please call the care guide team at [phone] if you need to cancel or reschedule your appointment.   Following are the goals we discussed today:  Patient Self Care Activities:  Attend all scheduled provider appointments Call provider office for new concerns or questions  Participate in Transition of Care Program/Attend TOC scheduled calls Take medications as prescribed   Continue pacing activity as your recuperation from recent surgery continues Continue cleaning and caring for your urinary catheter every day as you have been doing If you have any questions or concerns about your drain: please call your urologist/ surgeon right away Use assistive devices as needed to prevent falls- your cane If you believe your condition is getting worse- or you have any concerns: contact your care providers (doctors) promptly- reaching out to your doctor early when you have concerns can prevent you from having to go to the hospital  If you are experiencing a Mental Health or Behavioral Health Crisis or need someone to talk to, please  call the Suicide and Crisis Lifeline: 988 call the [place]  National Suicide Prevention Lifeline: (202)871-3670 or TTY: 367-462-5039 TTY 507-523-3884) to talk to a trained counselor call 1-800-273-TALK (toll free, 24 hour hotline) go to [facility] [address] [place] [phone]) call the [facility] Crisis Line: 978-365-0827 call 911   Patient verbalizes understanding of instructions and care plan provided today and agrees to view in MyChart. Active MyChart status and patient understanding of how to access instructions and care plan via  MyChart confirmed with patient.     Pls call/ message for questions,  Atley Neubert Mckinney Sloane Palmer, RN, BSN, CCRN Alumnus RN Care Manager  Transitions of Care  [facility] - [facility] [phone]: direct office

## 2024-02-09 ENCOUNTER — Other Ambulatory Visit: Payer: Self-pay | Admitting: *Deleted

## 2024-02-09 NOTE — Transitions of Care (Post Inpatient/ED Visit) (Signed)
  Transition of Care week 4  Visit Note  02/09/2024  Name: [name] MRN: [id number]          DOB: April 25, 1961  RNCM briefly spoke with DPR/Pamela. Call was broken and eventually disconnected. RNCM made several attempts to reconnect. A detailed HIPAA compliant message was left, including RNCM contact information. RNCM will attempt to reach patient/DPR tomorrow.  Andrea Dimes RN, BSN [place]  [facility] RN Care Manager [phone]

## 2024-02-10 ENCOUNTER — Encounter: Payer: Self-pay | Admitting: *Deleted

## 2024-02-13 ENCOUNTER — Encounter: Payer: Self-pay | Admitting: *Deleted

## 2024-02-23 ENCOUNTER — Ambulatory Visit: Admitting: Family Medicine

## 2024-03-12 ENCOUNTER — Ambulatory Visit: Payer: Self-pay

## 2024-03-12 NOTE — Telephone Encounter (Signed)
 Patient's wife was called and informed to go to urgent care and she states that patient said that he did not want to go he was feeling a little better. Wife states that she will take patient to urgent care once she gets off work.

## 2024-03-12 NOTE — Telephone Encounter (Signed)
 FYI Only or Action Required?: Action required by provider: request for appointment.  Patient was last seen in primary care on 01/26/2024 by Newlin, Enobong, MD.  Called Nurse Triage reporting Fatigue.  Symptoms began several weeks ago.  Interventions attempted: Rest, hydration, or home remedies.  Symptoms are: gradually worsening.  Triage Disposition: See Physician Within 24 Hours  Patient/caregiver understands and will follow disposition?: Yes    Copied from CRM #[id number]. Topic: Clinical - Red Word Triage >> Mar 12, 2024  9:02 AM [name] wrote: [name] that prompted transfer to Nurse Triage: Feeling weakness and about to pass out ( light headed ) .. had surgery 7/28 and 8/6 ( had to go back in - prostate cancer ) Reason for Disposition  [1] MODERATE weakness (e.English., interferes with work, school, normal activities) AND [2] persists > 3 days  Answer Assessment - Initial Assessment Questions Patient has had recent surgery to remove prostate cancer. Patient is being followed by Urologist. Patient had surgery on 7/28 and had to go back in to surgery on 8/6. Patient had a fainting episode due to low BP and 911 was called and he was taken to ED. Patient still has catheter in place. He states his weakness and lightheadedness has been going on for a few weeks. Patient added to wait list and this RN passing info urgently to PCP. Patient and wife advised to go to ED immediately if patient is feeling faint or worse. Patient's wife states she believes his blood levels could be low and he needs to see PCP to discuss iron supplement.    Please contact patient if patient can see PCP soon for ongoing weakness for a few weeks.   1. DESCRIPTION: Describe how you are feeling.     Patient has been feeling weak, telling wife he felt like he was going to pass out. Wife states she knows his blood is low. She states he feels lightheaded and weak like he is going to faint.  2. SEVERITY: How bad is it?   Can you stand and walk?     Patient has been doing fine lately but wife states he gets weak for a day or two 3. ONSET: When did these symptoms begin? (e.English., hours, days, weeks, months)     Patient had surgery on 7/28 and 8/6 and has had intermittent weakness and fatigue 4. CAUSE: What do you think is causing the weakness or fatigue? (e.English., not drinking enough fluids, medical problem, trouble sleeping)     Patient's wife is curious if he needs iron pills  5. NEW MEDICINES:  Have you started on any new medicines recently? (e.English., opioid pain medicines, benzodiazepines, muscle relaxants, antidepressants, antihistamines, neuroleptics, beta blockers)     N/a  6. OTHER SYMPTOMS: Do you have any other symptoms? (e.English., chest pain, fever, cough, SOB, vomiting, diarrhea, bleeding, other areas of pain)     Patient states he has had mild blood in catheter.  Protocols used: Weakness (Generalized) and Fatigue-A-AH

## 2024-03-31 ENCOUNTER — Other Ambulatory Visit: Payer: Self-pay | Admitting: Family Medicine

## 2024-04-02 ENCOUNTER — Other Ambulatory Visit: Payer: Self-pay | Admitting: Family Medicine

## 2024-04-02 DIAGNOSIS — I10 Essential (primary) hypertension: Secondary | ICD-10-CM

## 2024-04-03 NOTE — Telephone Encounter (Signed)
 Requested Prescriptions  Pending Prescriptions Disp Refills   lactulose  (CHRONULAC ) 10 GM/15ML solution [Pharmacy Med Name: LACTULOSE  10 GM/15 ML SOLUTION] 946 mL 1    Sig: TAKE 15 MLS (10 G TOTAL) BY MOUTH 3 (THREE) TIMES DAILY.     Gastroenterology:  Laxatives - lactulose  Failed - 04/03/2024  9:48 AM      Failed - Cl in normal range and within 360 days    Chloride  Date Value Ref Range Status  01/12/2024 96 (L) 98 - 111 mmol/L Final         Failed - Na in normal range and within 360 days    Sodium  Date Value Ref Range Status  01/12/2024 131 (L) 135 - 145 mmol/L Final  08/23/2023 139 134 - 144 mmol/L Final         Passed - CO2 in normal range and within 360 days    CO2  Date Value Ref Range Status  01/12/2024 27 22 - 32 mmol/L Final         Passed - K in normal range and within 360 days    Potassium  Date Value Ref Range Status  01/12/2024 3.9 3.5 - 5.1 mmol/L Final         Passed - Valid encounter within last 12 months    Recent Outpatient Visits           2 months ago Prostate cancer [facility])   [place] [facility] [place]. [facility] Delbert Clam, MD   4 months ago Sciatica of right side   [place] [facility] Of [place]. [facility] Delbert Clam, MD   5 months ago Foot callus   [place] [facility] Of [place]. [facility] Brien Belvie BRAVO, MD   7 months ago Essential hypertension   [place] [facility] [place] [facility] Of [place]. [facility] Delbert Clam, MD   1 year ago Weight loss   [place] [facility] Of [place]. [facility] Delbert Clam, MD       Future Appointments             In 1 month Delbert Clam, MD [facility] [place] - A Dept Of . [facility] [place]

## 2024-05-01 ENCOUNTER — Other Ambulatory Visit: Payer: Self-pay | Admitting: Family Medicine

## 2024-05-01 DIAGNOSIS — F32A Depression, unspecified: Secondary | ICD-10-CM

## 2024-05-01 DIAGNOSIS — M5431 Sciatica, right side: Secondary | ICD-10-CM

## 2024-05-09 ENCOUNTER — Ambulatory Visit: Payer: Self-pay | Admitting: Family Medicine

## 2024-05-09 ENCOUNTER — Telehealth: Payer: Self-pay | Admitting: Family Medicine

## 2024-05-09 NOTE — Telephone Encounter (Signed)
 Patient was contacted regarding missed appointment on 05/09/2024. Unable to leave voicemail.
